# Patient Record
Sex: Male | Born: 1945 | Race: White | Hispanic: No | Smoking: Current some day smoker
Health system: Southern US, Community
[De-identification: ages and names within clinical notes are randomized; demographics above are authoritative.]

## PROBLEM LIST (undated history)

## (undated) DIAGNOSIS — Z8719 Personal history of other diseases of the digestive system: Secondary | ICD-10-CM

## (undated) DIAGNOSIS — E872 Acidosis: Secondary | ICD-10-CM

## (undated) DIAGNOSIS — I2699 Other pulmonary embolism without acute cor pulmonale: Secondary | ICD-10-CM

## (undated) DIAGNOSIS — I2510 Atherosclerotic heart disease of native coronary artery without angina pectoris: Secondary | ICD-10-CM

## (undated) DIAGNOSIS — I639 Cerebral infarction, unspecified: Secondary | ICD-10-CM

## (undated) DIAGNOSIS — R7989 Other specified abnormal findings of blood chemistry: Secondary | ICD-10-CM

## (undated) DIAGNOSIS — N179 Acute kidney failure, unspecified: Secondary | ICD-10-CM

## (undated) DIAGNOSIS — J9620 Acute and chronic respiratory failure, unspecified whether with hypoxia or hypercapnia: Secondary | ICD-10-CM

## (undated) DIAGNOSIS — E669 Obesity, unspecified: Secondary | ICD-10-CM

## (undated) DIAGNOSIS — L0390 Cellulitis, unspecified: Secondary | ICD-10-CM

## (undated) DIAGNOSIS — I82409 Acute embolism and thrombosis of unspecified deep veins of unspecified lower extremity: Secondary | ICD-10-CM

## (undated) DIAGNOSIS — C4490 Unspecified malignant neoplasm of skin, unspecified: Secondary | ICD-10-CM

## (undated) DIAGNOSIS — J45909 Unspecified asthma, uncomplicated: Secondary | ICD-10-CM

## (undated) DIAGNOSIS — N189 Chronic kidney disease, unspecified: Secondary | ICD-10-CM

## (undated) DIAGNOSIS — M1990 Unspecified osteoarthritis, unspecified site: Secondary | ICD-10-CM

## (undated) DIAGNOSIS — I519 Heart disease, unspecified: Secondary | ICD-10-CM

## (undated) DIAGNOSIS — E119 Type 2 diabetes mellitus without complications: Secondary | ICD-10-CM

## (undated) DIAGNOSIS — Z79899 Other long term (current) drug therapy: Secondary | ICD-10-CM

## (undated) DIAGNOSIS — Z8673 Personal history of transient ischemic attack (TIA), and cerebral infarction without residual deficits: Secondary | ICD-10-CM

## (undated) DIAGNOSIS — Z951 Presence of aortocoronary bypass graft: Secondary | ICD-10-CM

## (undated) DIAGNOSIS — I10 Essential (primary) hypertension: Secondary | ICD-10-CM

## (undated) DIAGNOSIS — Z8711 Personal history of peptic ulcer disease: Secondary | ICD-10-CM

## (undated) DIAGNOSIS — Z8659 Personal history of other mental and behavioral disorders: Secondary | ICD-10-CM

## (undated) DIAGNOSIS — Z22322 Carrier or suspected carrier of Methicillin resistant Staphylococcus aureus: Secondary | ICD-10-CM

## (undated) DIAGNOSIS — Z87898 Personal history of other specified conditions: Secondary | ICD-10-CM

## (undated) DIAGNOSIS — R079 Chest pain, unspecified: Secondary | ICD-10-CM

## (undated) DIAGNOSIS — J42 Unspecified chronic bronchitis: Secondary | ICD-10-CM

## (undated) DIAGNOSIS — I248 Other forms of acute ischemic heart disease: Secondary | ICD-10-CM

## (undated) DIAGNOSIS — F319 Bipolar disorder, unspecified: Secondary | ICD-10-CM

## (undated) DIAGNOSIS — G4733 Obstructive sleep apnea (adult) (pediatric): Secondary | ICD-10-CM

## (undated) DIAGNOSIS — R6510 Systemic inflammatory response syndrome (SIRS) of non-infectious origin without acute organ dysfunction: Secondary | ICD-10-CM

## (undated) DIAGNOSIS — I509 Heart failure, unspecified: Secondary | ICD-10-CM

## (undated) DIAGNOSIS — E878 Other disorders of electrolyte and fluid balance, not elsewhere classified: Secondary | ICD-10-CM

## (undated) DIAGNOSIS — L02415 Cutaneous abscess of right lower limb: Secondary | ICD-10-CM

## (undated) DIAGNOSIS — L0291 Cutaneous abscess, unspecified: Secondary | ICD-10-CM

## (undated) DIAGNOSIS — Z8709 Personal history of other diseases of the respiratory system: Secondary | ICD-10-CM

## (undated) HISTORY — DX: Chronic kidney disease, unspecified: N18.9

## (undated) HISTORY — DX: Personal history of other diseases of the respiratory system: Z87.09

## (undated) HISTORY — DX: Chest pain, unspecified: R07.9

## (undated) HISTORY — DX: Essential (primary) hypertension: I10

## (undated) HISTORY — PX: GALLBLADDER SURGERY: SHX652

## (undated) HISTORY — DX: Unspecified asthma, uncomplicated: J45.909

## (undated) HISTORY — DX: Personal history of peptic ulcer disease: Z87.11

## (undated) HISTORY — DX: Cutaneous abscess, unspecified: L02.91

## (undated) HISTORY — PX: HERNIA REPAIR: SHX51

## (undated) HISTORY — DX: Acute embolism and thrombosis of unspecified deep veins of unspecified lower extremity: I82.409

## (undated) HISTORY — DX: Bipolar disorder, unspecified: F31.9

## (undated) HISTORY — DX: Atherosclerotic heart disease of native coronary artery without angina pectoris: I25.10

## (undated) HISTORY — DX: Heart disease, unspecified: I51.9

## (undated) HISTORY — DX: Other long term (current) drug therapy: Z79.899

## (undated) HISTORY — DX: Cutaneous abscess of right lower limb: L02.415

## (undated) HISTORY — DX: Unspecified malignant neoplasm of skin, unspecified: C44.90

## (undated) HISTORY — DX: Type 2 diabetes mellitus without complications: E11.9

## (undated) HISTORY — DX: Personal history of transient ischemic attack (TIA), and cerebral infarction without residual deficits: Z86.73

## (undated) HISTORY — DX: Personal history of other diseases of the digestive system: Z87.19

## (undated) HISTORY — DX: Personal history of other mental and behavioral disorders: Z86.59

## (undated) HISTORY — PX: KIDNEY SURGERY: SHX687

## (undated) HISTORY — DX: Other forms of acute ischemic heart disease: I24.8

## (undated) HISTORY — DX: Cellulitis, unspecified: L03.90

## (undated) HISTORY — DX: Acute and chronic respiratory failure, unspecified whether with hypoxia or hypercapnia: J96.20

## (undated) HISTORY — DX: Acute kidney failure, unspecified: N17.9

## (undated) HISTORY — DX: Unspecified chronic bronchitis: J42

## (undated) HISTORY — PX: CHOLECYSTECTOMY: SHX55

## (undated) HISTORY — DX: Systemic inflammatory response syndrome (sirs) of non-infectious origin without acute organ dysfunction: R65.10

## (undated) HISTORY — DX: Acidosis: E87.2

## (undated) HISTORY — DX: Obesity, unspecified: E66.9

## (undated) HISTORY — DX: Cerebral infarction, unspecified: I63.9

## (undated) HISTORY — DX: Other specified abnormal findings of blood chemistry: R79.89

## (undated) HISTORY — DX: Unspecified osteoarthritis, unspecified site: M19.90

## (undated) HISTORY — PX: CARDIAC SURGERY: SHX584

## (undated) HISTORY — DX: Other disorders of electrolyte and fluid balance, not elsewhere classified: E87.8

## (undated) HISTORY — DX: Personal history of other specified conditions: Z87.898

## (undated) HISTORY — PX: OTHER SURGICAL HISTORY: SHX169

---

## 1997-12-30 ENCOUNTER — Emergency Department (HOSPITAL_COMMUNITY): Admission: EM | Admit: 1997-12-30 | Discharge: 1997-12-30 | Payer: Self-pay | Admitting: Internal Medicine

## 1998-02-17 ENCOUNTER — Inpatient Hospital Stay (HOSPITAL_COMMUNITY): Admission: EM | Admit: 1998-02-17 | Discharge: 1998-02-20 | Payer: Self-pay | Admitting: Emergency Medicine

## 2000-04-21 ENCOUNTER — Inpatient Hospital Stay (HOSPITAL_COMMUNITY): Admission: EM | Admit: 2000-04-21 | Discharge: 2000-04-27 | Payer: Self-pay | Admitting: *Deleted

## 2002-04-29 ENCOUNTER — Inpatient Hospital Stay (HOSPITAL_COMMUNITY): Admission: EM | Admit: 2002-04-29 | Discharge: 2002-05-04 | Payer: Self-pay | Admitting: Psychiatry

## 2004-06-10 ENCOUNTER — Emergency Department: Payer: Self-pay | Admitting: Emergency Medicine

## 2004-08-18 ENCOUNTER — Inpatient Hospital Stay: Payer: Self-pay | Admitting: Unknown Physician Specialty

## 2004-09-04 ENCOUNTER — Emergency Department: Payer: Self-pay | Admitting: General Practice

## 2004-09-26 ENCOUNTER — Ambulatory Visit: Payer: Self-pay

## 2005-11-01 ENCOUNTER — Inpatient Hospital Stay: Payer: Self-pay | Admitting: Psychiatry

## 2006-05-03 ENCOUNTER — Other Ambulatory Visit: Payer: Self-pay

## 2006-05-03 ENCOUNTER — Emergency Department: Payer: Self-pay | Admitting: General Practice

## 2008-01-18 ENCOUNTER — Emergency Department: Payer: Self-pay | Admitting: Emergency Medicine

## 2008-02-20 ENCOUNTER — Ambulatory Visit: Payer: Self-pay | Admitting: Internal Medicine

## 2008-03-08 ENCOUNTER — Inpatient Hospital Stay: Payer: Self-pay | Admitting: Internal Medicine

## 2009-03-28 ENCOUNTER — Emergency Department: Payer: Self-pay | Admitting: Emergency Medicine

## 2009-09-30 ENCOUNTER — Inpatient Hospital Stay: Payer: Self-pay | Admitting: Internal Medicine

## 2009-11-11 ENCOUNTER — Ambulatory Visit: Payer: Self-pay | Admitting: Gastroenterology

## 2010-01-30 ENCOUNTER — Ambulatory Visit: Payer: Self-pay | Admitting: Gastroenterology

## 2010-04-16 ENCOUNTER — Ambulatory Visit: Payer: Self-pay | Admitting: Gastroenterology

## 2010-06-16 ENCOUNTER — Emergency Department: Payer: Self-pay | Admitting: Emergency Medicine

## 2010-09-14 ENCOUNTER — Observation Stay: Payer: Self-pay | Admitting: Internal Medicine

## 2010-09-28 ENCOUNTER — Emergency Department: Payer: Self-pay | Admitting: Emergency Medicine

## 2010-10-04 ENCOUNTER — Emergency Department: Payer: Self-pay | Admitting: Emergency Medicine

## 2010-10-26 ENCOUNTER — Emergency Department: Payer: Self-pay | Admitting: Emergency Medicine

## 2011-07-04 ENCOUNTER — Emergency Department: Payer: Self-pay | Admitting: Emergency Medicine

## 2011-07-04 LAB — COMPREHENSIVE METABOLIC PANEL
Albumin: 3.5 g/dL (ref 3.4–5.0)
Alkaline Phosphatase: 90 U/L (ref 50–136)
Calcium, Total: 8.6 mg/dL (ref 8.5–10.1)
Chloride: 99 mmol/L (ref 98–107)
Co2: 24 mmol/L (ref 21–32)
EGFR (African American): >60
Glucose: 124 mg/dL — ABNORMAL HIGH (ref 65–99)
Potassium: 4.4 mmol/L (ref 3.5–5.1)
SGOT(AST): 24 U/L (ref 15–37)
SGPT (ALT): 26 U/L
Sodium: 133 mmol/L — ABNORMAL LOW (ref 136–145)
Total Protein: 7.5 g/dL (ref 6.4–8.2)

## 2011-07-04 LAB — CBC WITH DIFFERENTIAL/PLATELET
Basophil #: 0 10*3/uL (ref 0.0–0.1)
Basophil %: 0.2 %
Eosinophil #: 0.3 10*3/uL (ref 0.0–0.7)
Eosinophil %: 3.3 %
HCT: 32.6 % — ABNORMAL LOW (ref 40.0–52.0)
HGB: 10.2 g/dL — ABNORMAL LOW (ref 13.0–18.0)
Lymphocyte #: 1.1 10*3/uL (ref 1.0–3.6)
MCH: 24 pg — ABNORMAL LOW (ref 26.0–34.0)
MCV: 77 fL — ABNORMAL LOW (ref 80–100)
Monocyte #: 0.7 10*3/uL (ref 0.0–0.7)
Neutrophil %: 74.9 %
RBC: 4.26 10*6/uL — ABNORMAL LOW (ref 4.40–5.90)
RDW: 16.8 % — ABNORMAL HIGH (ref 11.5–14.5)
WBC: 8.1 10*3/uL (ref 3.8–10.6)

## 2011-07-08 LAB — URINALYSIS, COMPLETE
Bacteria: NONE SEEN
Bilirubin,UR: NEGATIVE
Bilirubin,UR: NEGATIVE
Blood: NEGATIVE
Blood: NEGATIVE
Glucose,UR: NEGATIVE mg/dL (ref 0–75)
Ketone: NEGATIVE
Leukocyte Esterase: NEGATIVE
Leukocyte Esterase: NEGATIVE
Nitrite: NEGATIVE
Nitrite: NEGATIVE
Ph: 6 (ref 4.5–8.0)
Ph: 8 (ref 4.5–8.0)
Protein: NEGATIVE
Protein: NEGATIVE
RBC,UR: 1 /HPF (ref 0–5)
RBC,UR: 1 /HPF (ref 0–5)
Specific Gravity: 1.016 (ref 1.003–1.030)
Squamous Epithelial: <1
Squamous Epithelial: NONE SEEN
WBC UR: <1 /HPF (ref 0–5)

## 2011-07-08 LAB — RETICULOCYTES
Absolute Retic Count: 0.13 10*6/uL — ABNORMAL HIGH (ref 0.024–0.084)
Reticulocyte: 3.4 % — ABNORMAL HIGH (ref 0.5–1.5)

## 2011-07-08 LAB — IRON AND TIBC
Iron Bind.Cap.(Total): 361 ug/dL (ref 250–450)
Iron Saturation: 11 %
Iron: 39 ug/dL — ABNORMAL LOW (ref 65–175)
Unbound Iron-Bind.Cap.: 322 ug/dL

## 2011-07-08 LAB — CK TOTAL AND CKMB (NOT AT ARMC)
CK, Total: 256 U/L — ABNORMAL HIGH (ref 35–232)
CK, Total: 302 U/L — ABNORMAL HIGH (ref 35–232)
CK-MB: 2.4 ng/mL (ref 0.5–3.6)
CK-MB: 2.8 ng/mL (ref 0.5–3.6)

## 2011-07-08 LAB — COMPREHENSIVE METABOLIC PANEL
Albumin: 3.2 g/dL — ABNORMAL LOW (ref 3.4–5.0)
Anion Gap: 10 (ref 7–16)
Calcium, Total: 8.1 mg/dL — ABNORMAL LOW (ref 8.5–10.1)
Co2: 22 mmol/L (ref 21–32)
EGFR (African American): >60
Glucose: 98 mg/dL (ref 65–99)
Potassium: 4.6 mmol/L (ref 3.5–5.1)
SGOT(AST): 38 U/L — ABNORMAL HIGH (ref 15–37)
SGPT (ALT): 29 U/L

## 2011-07-08 LAB — CBC
MCHC: 32 g/dL (ref 32.0–36.0)
MCV: 77 fL — ABNORMAL LOW (ref 80–100)
Platelet: 261 10*3/uL (ref 150–440)
RBC: 3.82 10*6/uL — ABNORMAL LOW (ref 4.40–5.90)
RDW: 17.9 % — ABNORMAL HIGH (ref 11.5–14.5)
WBC: 5.3 10*3/uL (ref 3.8–10.6)

## 2011-07-08 LAB — TROPONIN I: Troponin-I: 0.03 ng/mL

## 2011-07-09 LAB — BASIC METABOLIC PANEL
Anion Gap: 12 (ref 7–16)
BUN: 10 mg/dL (ref 7–18)
Creatinine: 1 mg/dL (ref 0.60–1.30)
EGFR (African American): >60
EGFR (Non-African Amer.): >60
Glucose: 106 mg/dL — ABNORMAL HIGH (ref 65–99)
Sodium: 128 mmol/L — ABNORMAL LOW (ref 136–145)

## 2011-07-09 LAB — CBC WITH DIFFERENTIAL/PLATELET
Basophil #: 0 10*3/uL (ref 0.0–0.1)
Eosinophil #: 0.4 10*3/uL (ref 0.0–0.7)
HGB: 10.1 g/dL — ABNORMAL LOW (ref 13.0–18.0)
Lymphocyte #: 1.2 10*3/uL (ref 1.0–3.6)
Lymphocyte %: 21.2 %
MCH: 24.6 pg — ABNORMAL LOW (ref 26.0–34.0)
MCHC: 32.5 g/dL (ref 32.0–36.0)
MCV: 76 fL — ABNORMAL LOW (ref 80–100)
Monocyte %: 12.4 %
RDW: 18.4 % — ABNORMAL HIGH (ref 11.5–14.5)

## 2011-07-09 LAB — SODIUM: Sodium: 126 mmol/L — ABNORMAL LOW (ref 136–145)

## 2011-07-09 LAB — LIPID PANEL
Cholesterol: 139 mg/dL (ref 0–200)
HDL Cholesterol: 46 mg/dL (ref 40–60)
VLDL Cholesterol, Calc: 25 mg/dL (ref 5–40)

## 2011-07-09 LAB — CK TOTAL AND CKMB (NOT AT ARMC)
CK, Total: 261 U/L — ABNORMAL HIGH (ref 35–232)
CK-MB: 2.6 ng/mL (ref 0.5–3.6)

## 2011-07-10 ENCOUNTER — Inpatient Hospital Stay: Payer: Self-pay | Admitting: Physician Assistant

## 2011-07-10 LAB — CBC WITH DIFFERENTIAL/PLATELET
Basophil #: 0 10*3/uL (ref 0.0–0.1)
Eosinophil #: 0.3 10*3/uL (ref 0.0–0.7)
HGB: 10.2 g/dL — ABNORMAL LOW (ref 13.0–18.0)
Lymphocyte %: 25 %
MCHC: 31.4 g/dL — ABNORMAL LOW (ref 32.0–36.0)
Monocyte %: 16.2 %
Neutrophil %: 51.9 %
RBC: 4.2 10*6/uL — ABNORMAL LOW (ref 4.40–5.90)
RDW: 18 % — ABNORMAL HIGH (ref 11.5–14.5)
WBC: 5.3 10*3/uL (ref 3.8–10.6)

## 2011-07-10 LAB — SODIUM
Sodium: 121 mmol/L — ABNORMAL LOW (ref 136–145)
Sodium: 123 mmol/L — ABNORMAL LOW (ref 136–145)

## 2011-07-10 LAB — BASIC METABOLIC PANEL
BUN: 12 mg/dL (ref 7–18)
Calcium, Total: 8.5 mg/dL (ref 8.5–10.1)
Chloride: 94 mmol/L — ABNORMAL LOW (ref 98–107)
Creatinine: 0.97 mg/dL (ref 0.60–1.30)
Potassium: 3.8 mmol/L (ref 3.5–5.1)
Sodium: 125 mmol/L — ABNORMAL LOW (ref 136–145)

## 2011-07-11 LAB — CBC WITH DIFFERENTIAL/PLATELET
Eosinophil #: 0.5 10*3/uL (ref 0.0–0.7)
Eosinophil %: 8.8 %
HGB: 10.6 g/dL — ABNORMAL LOW (ref 13.0–18.0)
Lymphocyte #: 1.2 10*3/uL (ref 1.0–3.6)
MCH: 24.8 pg — ABNORMAL LOW (ref 26.0–34.0)
MCHC: 32.1 g/dL (ref 32.0–36.0)
MCV: 77 fL — ABNORMAL LOW (ref 80–100)
Monocyte #: 0.8 10*3/uL — ABNORMAL HIGH (ref 0.0–0.7)
Monocyte %: 15.2 %
Neutrophil %: 52.4 %
Platelet: 278 10*3/uL (ref 150–440)
RBC: 4.28 10*6/uL — ABNORMAL LOW (ref 4.40–5.90)
RDW: 18.2 % — ABNORMAL HIGH (ref 11.5–14.5)

## 2011-07-11 LAB — BASIC METABOLIC PANEL
Anion Gap: 10 (ref 7–16)
Calcium, Total: 8.6 mg/dL (ref 8.5–10.1)
Chloride: 95 mmol/L — ABNORMAL LOW (ref 98–107)
Co2: 21 mmol/L (ref 21–32)
EGFR (Non-African Amer.): >60
Glucose: 106 mg/dL — ABNORMAL HIGH (ref 65–99)
Osmolality: 252 (ref 275–301)
Potassium: 4.4 mmol/L (ref 3.5–5.1)

## 2011-07-12 LAB — BASIC METABOLIC PANEL
Anion Gap: 8 (ref 7–16)
BUN: 11 mg/dL (ref 7–18)
Calcium, Total: 9 mg/dL (ref 8.5–10.1)
Chloride: 104 mmol/L (ref 98–107)
Co2: 23 mmol/L (ref 21–32)
Glucose: 102 mg/dL — ABNORMAL HIGH (ref 65–99)
Osmolality: 270 (ref 275–301)

## 2011-07-12 LAB — CBC WITH DIFFERENTIAL/PLATELET
HCT: 34 % — ABNORMAL LOW (ref 40.0–52.0)
MCH: 24.3 pg — ABNORMAL LOW (ref 26.0–34.0)
MCHC: 31.1 g/dL — ABNORMAL LOW (ref 32.0–36.0)
MCV: 78 fL — ABNORMAL LOW (ref 80–100)
Monocyte #: 0.6 10*3/uL (ref 0.0–0.7)
Neutrophil #: 3.3 10*3/uL (ref 1.4–6.5)
Platelet: 277 10*3/uL (ref 150–440)
RDW: 18.2 % — ABNORMAL HIGH (ref 11.5–14.5)

## 2011-07-13 LAB — CBC WITH DIFFERENTIAL/PLATELET
Basophil #: 0 10*3/uL (ref 0.0–0.1)
Eosinophil #: 0.4 10*3/uL (ref 0.0–0.7)
Eosinophil %: 5.3 %
HCT: 33.9 % — ABNORMAL LOW (ref 40.0–52.0)
HGB: 10.8 g/dL — ABNORMAL LOW (ref 13.0–18.0)
Lymphocyte #: 1.3 10*3/uL (ref 1.0–3.6)
Lymphocyte %: 20.2 %
MCV: 79 fL — ABNORMAL LOW (ref 80–100)
Monocyte #: 0.8 10*3/uL — ABNORMAL HIGH (ref 0.0–0.7)
Monocyte %: 12.6 %
Neutrophil #: 4.1 10*3/uL (ref 1.4–6.5)
RBC: 4.31 10*6/uL — ABNORMAL LOW (ref 4.40–5.90)
RDW: 18.7 % — ABNORMAL HIGH (ref 11.5–14.5)

## 2011-07-13 LAB — BASIC METABOLIC PANEL WITH GFR
Anion Gap: 11
BUN: 12 mg/dL
Calcium, Total: 8.9 mg/dL
Chloride: 105 mmol/L
Co2: 23 mmol/L
Creatinine: 0.92 mg/dL
EGFR (African American): >60
EGFR (Non-African Amer.): >60
Glucose: 103 mg/dL — ABNORMAL HIGH
Osmolality: 278
Potassium: 4.3 mmol/L
Sodium: 139 mmol/L

## 2011-07-24 ENCOUNTER — Emergency Department: Payer: Self-pay | Admitting: Emergency Medicine

## 2011-07-24 LAB — PRO B NATRIURETIC PEPTIDE: B-Type Natriuretic Peptide: 246 pg/mL — ABNORMAL HIGH (ref 0–125)

## 2011-07-24 LAB — COMPREHENSIVE METABOLIC PANEL
Albumin: 3.2 g/dL — ABNORMAL LOW (ref 3.4–5.0)
Alkaline Phosphatase: 75 U/L (ref 50–136)
Anion Gap: 11 (ref 7–16)
Calcium, Total: 8.1 mg/dL — ABNORMAL LOW (ref 8.5–10.1)
Chloride: 99 mmol/L (ref 98–107)
EGFR (African American): >60
Glucose: 123 mg/dL — ABNORMAL HIGH (ref 65–99)
Potassium: 4.3 mmol/L (ref 3.5–5.1)
SGOT(AST): 20 U/L (ref 15–37)
Total Protein: 7 g/dL (ref 6.4–8.2)

## 2011-07-24 LAB — URINALYSIS, COMPLETE
Bacteria: NONE SEEN
Bilirubin,UR: NEGATIVE
Blood: NEGATIVE
Glucose,UR: NEGATIVE mg/dL (ref 0–75)
Nitrite: NEGATIVE
Ph: 7 (ref 4.5–8.0)
RBC,UR: 1 /HPF (ref 0–5)
Specific Gravity: 1.01 (ref 1.003–1.030)
Squamous Epithelial: 1
WBC UR: <1 /HPF (ref 0–5)

## 2011-07-24 LAB — APTT: Activated PTT: 25.8 secs (ref 23.6–35.9)

## 2011-07-24 LAB — CBC
HCT: 31 % — ABNORMAL LOW (ref 40.0–52.0)
HGB: 10 g/dL — ABNORMAL LOW (ref 13.0–18.0)
MCH: 25.4 pg — ABNORMAL LOW (ref 26.0–34.0)
MCHC: 32.1 g/dL (ref 32.0–36.0)
MCV: 79 fL — ABNORMAL LOW (ref 80–100)
Platelet: 304 10*3/uL (ref 150–440)
RBC: 3.92 10*6/uL — ABNORMAL LOW (ref 4.40–5.90)
RDW: 20 % — ABNORMAL HIGH (ref 11.5–14.5)
WBC: 6.8 10*3/uL (ref 3.8–10.6)

## 2011-07-24 LAB — PROTIME-INR
INR: 1
Prothrombin Time: 13.6 secs (ref 11.5–14.7)

## 2012-01-25 ENCOUNTER — Emergency Department: Payer: Self-pay | Admitting: Emergency Medicine

## 2012-01-25 LAB — BASIC METABOLIC PANEL
Calcium, Total: 8.8 mg/dL (ref 8.5–10.1)
Chloride: 108 mmol/L — ABNORMAL HIGH (ref 98–107)
Co2: 26 mmol/L (ref 21–32)
EGFR (African American): >60
EGFR (Non-African Amer.): >60
Glucose: 108 mg/dL — ABNORMAL HIGH (ref 65–99)
Osmolality: 282 (ref 275–301)
Potassium: 4.3 mmol/L (ref 3.5–5.1)
Sodium: 140 mmol/L (ref 136–145)

## 2012-01-25 LAB — CK TOTAL AND CKMB (NOT AT ARMC)
CK, Total: 206 U/L (ref 35–232)
CK-MB: 0.5 ng/mL (ref 0.5–3.6)

## 2012-01-25 LAB — CBC
HGB: 11.7 g/dL — ABNORMAL LOW (ref 13.0–18.0)
MCH: 24.4 pg — ABNORMAL LOW (ref 26.0–34.0)
MCHC: 31.4 g/dL — ABNORMAL LOW (ref 32.0–36.0)
Platelet: 276 10*3/uL (ref 150–440)
RBC: 4.81 10*6/uL (ref 4.40–5.90)
WBC: 8.4 10*3/uL (ref 3.8–10.6)

## 2012-01-25 LAB — TROPONIN I: Troponin-I: <0.02 ng/mL

## 2012-01-26 LAB — CK TOTAL AND CKMB (NOT AT ARMC): CK-MB: 0.7 ng/mL (ref 0.5–3.6)

## 2012-01-26 LAB — TROPONIN I: Troponin-I: <0.02 ng/mL

## 2012-02-08 ENCOUNTER — Emergency Department: Payer: Self-pay | Admitting: Emergency Medicine

## 2012-02-08 LAB — COMPREHENSIVE METABOLIC PANEL
Anion Gap: 9 (ref 7–16)
BUN: 16 mg/dL (ref 7–18)
Bilirubin,Total: 0.2 mg/dL (ref 0.2–1.0)
Chloride: 108 mmol/L — ABNORMAL HIGH (ref 98–107)
Creatinine: 1.26 mg/dL (ref 0.60–1.30)
EGFR (African American): >60
Osmolality: 286 (ref 275–301)
Potassium: 4 mmol/L (ref 3.5–5.1)
SGPT (ALT): 32 U/L (ref 12–78)
Sodium: 142 mmol/L (ref 136–145)
Total Protein: 7.9 g/dL (ref 6.4–8.2)

## 2012-02-08 LAB — CBC
HCT: 37.5 % — ABNORMAL LOW (ref 40.0–52.0)
MCHC: 31.5 g/dL — ABNORMAL LOW (ref 32.0–36.0)
MCV: 78 fL — ABNORMAL LOW (ref 80–100)
Platelet: 295 10*3/uL (ref 150–440)
RBC: 4.81 10*6/uL (ref 4.40–5.90)
RDW: 18.6 % — ABNORMAL HIGH (ref 11.5–14.5)
WBC: 10.9 10*3/uL — ABNORMAL HIGH (ref 3.8–10.6)

## 2012-02-08 LAB — TROPONIN I
Troponin-I: <0.02 ng/mL
Troponin-I: <0.02 ng/mL

## 2012-02-08 LAB — URINALYSIS, COMPLETE
Bilirubin,UR: NEGATIVE
Blood: NEGATIVE
Glucose,UR: NEGATIVE mg/dL (ref 0–75)
Ketone: NEGATIVE
RBC,UR: 1 /HPF (ref 0–5)
Specific Gravity: 1.005 (ref 1.003–1.030)
Squamous Epithelial: <1
WBC UR: 14 /HPF (ref 0–5)

## 2012-02-08 LAB — CK TOTAL AND CKMB (NOT AT ARMC)
CK, Total: 215 U/L (ref 35–232)
CK-MB: 1.1 ng/mL (ref 0.5–3.6)

## 2012-02-08 LAB — PRO B NATRIURETIC PEPTIDE: B-Type Natriuretic Peptide: 61 pg/mL (ref 0–125)

## 2012-05-29 ENCOUNTER — Emergency Department: Payer: Self-pay | Admitting: Emergency Medicine

## 2012-08-01 ENCOUNTER — Emergency Department: Payer: Self-pay | Admitting: Emergency Medicine

## 2012-08-01 LAB — CBC
HGB: 9.9 g/dL — ABNORMAL LOW (ref 13.0–18.0)
Platelet: 326 10*3/uL (ref 150–440)
RBC: 4.19 10*6/uL — ABNORMAL LOW (ref 4.40–5.90)

## 2013-01-27 ENCOUNTER — Ambulatory Visit: Payer: Self-pay | Admitting: Internal Medicine

## 2013-02-24 ENCOUNTER — Encounter: Payer: Self-pay | Admitting: *Deleted

## 2013-02-28 ENCOUNTER — Ambulatory Visit: Payer: Self-pay | Admitting: Podiatry

## 2013-03-01 ENCOUNTER — Emergency Department: Payer: Self-pay | Admitting: Emergency Medicine

## 2013-03-14 ENCOUNTER — Ambulatory Visit (INDEPENDENT_AMBULATORY_CARE_PROVIDER_SITE_OTHER): Payer: PRIVATE HEALTH INSURANCE | Admitting: Podiatry

## 2013-03-14 ENCOUNTER — Encounter: Payer: Self-pay | Admitting: Podiatry

## 2013-03-14 VITALS — BP 116/52 | HR 86 | Resp 16 | Ht 69.0 in | Wt 334.0 lb

## 2013-03-14 DIAGNOSIS — B351 Tinea unguium: Secondary | ICD-10-CM

## 2013-03-14 DIAGNOSIS — M79609 Pain in unspecified limb: Secondary | ICD-10-CM

## 2013-03-14 NOTE — Progress Notes (Signed)
Subjective:     Patient ID: [name], male   DOB: [birth year], 67 y.o.   MRN: [id number]  HPI patient presents stating I have painful nail disease and I cannot cut them myself. Patient is obese which is a complicating factor for this condition   Review of Systems     Objective:   Physical Exam    neurovascular status found to be intact. Patient has thick painful nailbeds 1 through 5 bilateral that are incurvated and sore with pressure Assessment:     Mycotic infection nailbeds 1-5 bilateral with pain    Plan:     Debridement of nailbeds 1 through 5 bilateral no iatrogenic bleeding noted

## 2013-06-05 ENCOUNTER — Observation Stay: Payer: Self-pay | Admitting: Internal Medicine

## 2013-06-05 LAB — CBC
HCT: 32.3 % — AB (ref 40.0–52.0)
HGB: 9.6 g/dL — AB (ref 13.0–18.0)
MCH: 20.9 pg — ABNORMAL LOW (ref 26.0–34.0)
MCHC: 29.9 g/dL — ABNORMAL LOW (ref 32.0–36.0)
MCV: 70 fL — ABNORMAL LOW (ref 80–100)
PLATELETS: 312 10*3/uL (ref 150–440)
RBC: 4.6 10*6/uL (ref 4.40–5.90)
RDW: 19.5 % — ABNORMAL HIGH (ref 11.5–14.5)
WBC: 11.2 10*3/uL — AB (ref 3.8–10.6)

## 2013-06-05 LAB — COMPREHENSIVE METABOLIC PANEL
ALBUMIN: 3 g/dL — AB (ref 3.4–5.0)
ALT: 22 U/L (ref 12–78)
AST: 12 U/L — AB (ref 15–37)
Alkaline Phosphatase: 127 U/L — ABNORMAL HIGH
Anion Gap: 6 — ABNORMAL LOW (ref 7–16)
BUN: 16 mg/dL (ref 7–18)
Bilirubin,Total: 0.4 mg/dL (ref 0.2–1.0)
CALCIUM: 8.6 mg/dL (ref 8.5–10.1)
CO2: 27 mmol/L (ref 21–32)
CREATININE: 1.38 mg/dL — AB (ref 0.60–1.30)
Chloride: 104 mmol/L (ref 98–107)
EGFR (African American): >60
EGFR (Non-African Amer.): 52 — ABNORMAL LOW
Glucose: 104 mg/dL — ABNORMAL HIGH (ref 65–99)
Osmolality: 275 (ref 275–301)
Potassium: 3.7 mmol/L (ref 3.5–5.1)
Sodium: 137 mmol/L (ref 136–145)
Total Protein: 7.7 g/dL (ref 6.4–8.2)

## 2013-06-05 LAB — APTT: Activated PTT: 29.8 secs (ref 23.6–35.9)

## 2013-06-05 LAB — CK TOTAL AND CKMB (NOT AT ARMC)
CK, TOTAL: 108 U/L (ref 35–232)
CK-MB: 0.7 ng/mL (ref 0.5–3.6)

## 2013-06-05 LAB — PROTIME-INR
INR: 1.1
Prothrombin Time: 14 secs (ref 11.5–14.7)

## 2013-06-05 LAB — TROPONIN I: Troponin-I: <0.02 ng/mL

## 2013-06-13 ENCOUNTER — Ambulatory Visit (INDEPENDENT_AMBULATORY_CARE_PROVIDER_SITE_OTHER): Payer: Medicare HMO | Admitting: Podiatry

## 2013-06-13 DIAGNOSIS — M79609 Pain in unspecified limb: Secondary | ICD-10-CM

## 2013-06-13 DIAGNOSIS — B351 Tinea unguium: Secondary | ICD-10-CM

## 2013-06-13 NOTE — Progress Notes (Signed)
Subjective:     Patient ID: [name], male   DOB: December 26, 1945, 68 y.o.   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

## 2013-09-12 ENCOUNTER — Ambulatory Visit (INDEPENDENT_AMBULATORY_CARE_PROVIDER_SITE_OTHER): Payer: Medicare HMO | Admitting: Podiatry

## 2013-09-12 VITALS — Resp 16 | Ht 69.0 in | Wt 336.0 lb

## 2013-09-12 DIAGNOSIS — Q828 Other specified congenital malformations of skin: Secondary | ICD-10-CM

## 2013-09-12 DIAGNOSIS — M79609 Pain in unspecified limb: Secondary | ICD-10-CM

## 2013-09-12 DIAGNOSIS — B351 Tinea unguium: Secondary | ICD-10-CM

## 2013-09-12 DIAGNOSIS — E1159 Type 2 diabetes mellitus with other circulatory complications: Secondary | ICD-10-CM

## 2013-09-12 NOTE — Progress Notes (Signed)
Subjective:     Patient ID: [name], male   DOB: Apr 03, 1946, 68 y.o.   MRN: [id number]  HPI obese male who presents with nail thickness 1-5 both feet that he cannot cut himself and they become painful and keratotic lesion left first metatarsal. Long-term diabetic who has had issues for a long time and also has a small crack in the plantar left heel that is sore but he cannot take care of   Review of Systems     Objective:   Physical Exam Neurovascular status unchanged with patient well oriented x3 and found to have nail thickness 1-5 of both feet that are tender. Keratotic lesion of the left first metatarsal medial side and small crack left heel that is not open with no drainage odor or redness    Assessment:     Mycotic nail infection with pain and keratotic lesion sub-first metatarsal left with porokeratotic base in an at risk obese diabetic with small break of skin left heel that is not infected    Plan:     H&P performed and we will keep an eye on the plantar left heel. I gave him instructions if any drainage should occur redness swelling or pain to let us know immediately and today I debrided nailbeds 1-5 of both feet with no bleeding noted and lesion on the left feet with no bleeding noted

## 2013-11-22 DIAGNOSIS — E669 Obesity, unspecified: Secondary | ICD-10-CM

## 2013-11-22 DIAGNOSIS — J45909 Unspecified asthma, uncomplicated: Secondary | ICD-10-CM

## 2013-11-22 HISTORY — DX: Obesity, unspecified: E66.9

## 2013-11-22 HISTORY — DX: Unspecified asthma, uncomplicated: J45.909

## 2014-01-09 ENCOUNTER — Ambulatory Visit (INDEPENDENT_AMBULATORY_CARE_PROVIDER_SITE_OTHER): Payer: Medicare HMO | Admitting: Podiatry

## 2014-01-09 DIAGNOSIS — Q828 Other specified congenital malformations of skin: Secondary | ICD-10-CM

## 2014-01-09 DIAGNOSIS — E1159 Type 2 diabetes mellitus with other circulatory complications: Secondary | ICD-10-CM

## 2014-01-09 DIAGNOSIS — M79673 Pain in unspecified foot: Secondary | ICD-10-CM

## 2014-01-09 DIAGNOSIS — M79609 Pain in unspecified limb: Secondary | ICD-10-CM

## 2014-01-09 DIAGNOSIS — B351 Tinea unguium: Secondary | ICD-10-CM

## 2014-01-09 NOTE — Progress Notes (Signed)
Subjective:     Patient ID: [name], male   DOB: July 06, 1945, 68 y.o.   MRN: [id number]  HPI patient presents with thick incurvated nail bed 1-5 of both feet and lesions on the first metatarsal of both feet that are painful when pressed and he cannot take care of himself due to obesity long-term diabetes   Review of Systems     Objective:   Physical Exam Neurovascular status unchanged with thick yellow brittle nailbeds 1-5 both feet and keratotic lesions that are painful on the first metatarsal of both feet. Varicosities in the ankle edema in the forefoot and diminishment of circulatory status noted    Assessment:     At risk diabetic with lesion formation and nail disease that he cannot take care of and are painful    Plan:     Debridement of nailbeds 1-5 of both feet and debridement of lesions of both feet

## 2014-01-29 ENCOUNTER — Observation Stay: Payer: Self-pay | Admitting: Internal Medicine

## 2014-01-29 LAB — CBC
HCT: 32.1 % — ABNORMAL LOW (ref 40.0–52.0)
HGB: 9.4 g/dL — ABNORMAL LOW (ref 13.0–18.0)
MCH: 21 pg — ABNORMAL LOW (ref 26.0–34.0)
MCHC: 29.4 g/dL — ABNORMAL LOW (ref 32.0–36.0)
MCV: 71 fL — AB (ref 80–100)
Platelet: 332 10*3/uL (ref 150–440)
RBC: 4.5 10*6/uL (ref 4.40–5.90)
RDW: 19.9 % — ABNORMAL HIGH (ref 11.5–14.5)
WBC: 11.2 10*3/uL — ABNORMAL HIGH (ref 3.8–10.6)

## 2014-01-29 LAB — BASIC METABOLIC PANEL
Anion Gap: 7 (ref 7–16)
BUN: 21 mg/dL — ABNORMAL HIGH (ref 7–18)
CHLORIDE: 106 mmol/L (ref 98–107)
CO2: 24 mmol/L (ref 21–32)
CREATININE: 1.67 mg/dL — AB (ref 0.60–1.30)
Calcium, Total: 8.4 mg/dL — ABNORMAL LOW (ref 8.5–10.1)
EGFR (African American): 48 — ABNORMAL LOW
GFR CALC NON AF AMER: 41 — AB
Glucose: 144 mg/dL — ABNORMAL HIGH (ref 65–99)
Osmolality: 279 (ref 275–301)
Potassium: 4.5 mmol/L (ref 3.5–5.1)
Sodium: 137 mmol/L (ref 136–145)

## 2014-01-29 LAB — PROTIME-INR
INR: 1
PROTHROMBIN TIME: 13.4 s (ref 11.5–14.7)

## 2014-01-29 LAB — TROPONIN I: Troponin-I: <0.02 ng/mL

## 2014-01-29 LAB — PRO B NATRIURETIC PEPTIDE: B-Type Natriuretic Peptide: 105 pg/mL (ref 0–125)

## 2014-01-30 LAB — CBC WITH DIFFERENTIAL/PLATELET
BASOS PCT: 0.6 %
Basophil #: 0 10*3/uL (ref 0.0–0.1)
EOS ABS: 0.4 10*3/uL (ref 0.0–0.7)
EOS PCT: 5.2 %
HCT: 30.7 % — ABNORMAL LOW (ref 40.0–52.0)
HGB: 8.9 g/dL — ABNORMAL LOW (ref 13.0–18.0)
LYMPHS PCT: 21 %
Lymphocyte #: 1.7 10*3/uL (ref 1.0–3.6)
MCH: 20.7 pg — AB (ref 26.0–34.0)
MCHC: 29.1 g/dL — ABNORMAL LOW (ref 32.0–36.0)
MCV: 71 fL — AB (ref 80–100)
MONO ABS: 0.8 x10 3/mm (ref 0.2–1.0)
MONOS PCT: 10.3 %
NEUTROS ABS: 5.2 10*3/uL (ref 1.4–6.5)
Neutrophil %: 62.9 %
PLATELETS: 310 10*3/uL (ref 150–440)
RBC: 4.31 10*6/uL — ABNORMAL LOW (ref 4.40–5.90)
RDW: 19.4 % — ABNORMAL HIGH (ref 11.5–14.5)
WBC: 8.2 10*3/uL (ref 3.8–10.6)

## 2014-01-30 LAB — BASIC METABOLIC PANEL
Anion Gap: 9 (ref 7–16)
BUN: 19 mg/dL — AB (ref 7–18)
CO2: 24 mmol/L (ref 21–32)
Calcium, Total: 8.2 mg/dL — ABNORMAL LOW (ref 8.5–10.1)
Chloride: 109 mmol/L — ABNORMAL HIGH (ref 98–107)
Creatinine: 1.45 mg/dL — ABNORMAL HIGH (ref 0.60–1.30)
EGFR (African American): 57 — ABNORMAL LOW
EGFR (Non-African Amer.): 49 — ABNORMAL LOW
Glucose: 143 mg/dL — ABNORMAL HIGH (ref 65–99)
OSMOLALITY: 288 (ref 275–301)
POTASSIUM: 4.4 mmol/L (ref 3.5–5.1)
SODIUM: 142 mmol/L (ref 136–145)

## 2014-01-30 LAB — TROPONIN I

## 2014-04-10 ENCOUNTER — Ambulatory Visit (INDEPENDENT_AMBULATORY_CARE_PROVIDER_SITE_OTHER): Payer: Medicare HMO | Admitting: Podiatry

## 2014-04-10 DIAGNOSIS — B351 Tinea unguium: Secondary | ICD-10-CM

## 2014-04-10 DIAGNOSIS — M79676 Pain in unspecified toe(s): Secondary | ICD-10-CM

## 2014-04-11 NOTE — Progress Notes (Signed)
Patient ID: [name], male   DOB: 20-Jan-1946, 68 y.o.   MRN: [id number]  Subjective: 68 year old male returns the office today for diabetic risk assessment and for painful elongated toenails. He states that he is unable to trim the toes nails himself and they are painful particularly with shoe gear. Also states that his left fourth digit toenail turn black over one month ago after hitting it in a door. He denies any pain to the digit or any swelling, erythema. He states that his blood sugars typically runs approximate 120. He states that the left heel will occasionally open from a fissure however is not opened recently. No other complaints at this time. No acute changes since last. Denies any systemic complaints such as fevers, chills, nausea, vomiting.  Objective: AAO 3, NAD DP/PT pulse palpable bilaterally, CRT less than 3 seconds Protective sensation intact with Derrel Nip mom,, vibratory sensation intact, Achilles tendon reflex intact Nails hypertrophic, dystrophic, elongated, brittle, discolored 10. No surrounding erythema or drainage. The left fourth digit nail has black discoloration with subungual hematoma. No open lesions/ no fissuring noted in the left heel No calf pain, swelling, warmth, erythema.  Assessment: 68 year old male with onychomycosis  Plan: -Treatment options discussed including alternatives, risks, complications. -Nail sharply debrided 10 without complications. The left fourth digit nail has what appears to be subungual hematoma and some of the dried blood was removed at today's appointment. Discussed the patient that it should grow out with the nail. Discussed with him that if the nail loosens were causes any problems to call the office to make an appointment sooner. -Discussed the importance of daily foot inspection. -Follow-up in 3 months. In the meantime, call the office with any questions, concerns, change in symptoms.

## 2014-07-01 LAB — CBC
HCT: 36.7 % — ABNORMAL LOW (ref 40.0–52.0)
HGB: 10.9 g/dL — AB (ref 13.0–18.0)
MCH: 21.5 pg — ABNORMAL LOW (ref 26.0–34.0)
MCHC: 29.6 g/dL — AB (ref 32.0–36.0)
MCV: 73 fL — ABNORMAL LOW (ref 80–100)
Platelet: 310 10*3/uL (ref 150–440)
RBC: 5.06 10*6/uL (ref 4.40–5.90)
RDW: 19.4 % — AB (ref 11.5–14.5)
WBC: 6.8 10*3/uL (ref 3.8–10.6)

## 2014-07-01 LAB — BASIC METABOLIC PANEL
ANION GAP: 7 (ref 7–16)
BUN: 13 mg/dL (ref 7–18)
CHLORIDE: 111 mmol/L — AB (ref 98–107)
CREATININE: 1.48 mg/dL — AB (ref 0.60–1.30)
Calcium, Total: 8.9 mg/dL (ref 8.5–10.1)
Co2: 27 mmol/L (ref 21–32)
EGFR (African American): >60
EGFR (Non-African Amer.): 50 — ABNORMAL LOW
Glucose: 101 mg/dL — ABNORMAL HIGH (ref 65–99)
OSMOLALITY: 289 (ref 275–301)
POTASSIUM: 3.2 mmol/L — AB (ref 3.5–5.1)
Sodium: 145 mmol/L (ref 136–145)

## 2014-07-01 LAB — TROPONIN I: Troponin-I: 0.13 ng/mL — ABNORMAL HIGH

## 2014-07-01 LAB — CK TOTAL AND CKMB (NOT AT ARMC)
CK, Total: 98 U/L (ref 39–308)
CK-MB: 1.9 ng/mL (ref 0.5–3.6)

## 2014-07-02 ENCOUNTER — Observation Stay: Payer: Self-pay | Admitting: Internal Medicine

## 2014-07-02 LAB — URINALYSIS, COMPLETE
BILIRUBIN, UR: NEGATIVE
Blood: NEGATIVE
GLUCOSE, UR: NEGATIVE mg/dL (ref 0–75)
KETONE: NEGATIVE
Nitrite: POSITIVE
PH: 5 (ref 4.5–8.0)
Protein: 30
Specific Gravity: 1.024 (ref 1.003–1.030)
WBC UR: 57 /HPF (ref 0–5)

## 2014-07-02 LAB — TROPONIN I
Troponin-I: 0.08 ng/mL — ABNORMAL HIGH
Troponin-I: 0.06 ng/mL — ABNORMAL HIGH

## 2014-07-10 ENCOUNTER — Ambulatory Visit: Payer: Medicare HMO | Admitting: Podiatry

## 2014-07-12 ENCOUNTER — Ambulatory Visit: Payer: Medicare HMO | Admitting: Podiatry

## 2014-08-18 ENCOUNTER — Emergency Department: Payer: Self-pay | Admitting: Emergency Medicine

## 2014-08-19 LAB — TROPONIN I: Troponin-I: <0.03 ng/mL

## 2014-08-19 LAB — BASIC METABOLIC PANEL WITH GFR
Anion Gap: 5 — ABNORMAL LOW
BUN: 17 mg/dL
Calcium, Total: 8.4 mg/dL — ABNORMAL LOW
Chloride: 109 mmol/L
Co2: 26 mmol/L
Creatinine: 1.12 mg/dL
EGFR (African American): >60
EGFR (Non-African Amer.): >60
Glucose: 119 mg/dL — ABNORMAL HIGH
Potassium: 3.2 mmol/L — ABNORMAL LOW
Sodium: 140 mmol/L

## 2014-08-19 LAB — CBC
HCT: 36.5 % — ABNORMAL LOW
HGB: 11.3 g/dL — ABNORMAL LOW
MCH: 23.1 pg — ABNORMAL LOW
MCHC: 30.9 g/dL — ABNORMAL LOW
MCV: 75 fL — ABNORMAL LOW
Platelet: 283 x10 3/mm 3
RBC: 4.89 x10 6/mm 3
RDW: 20.7 % — ABNORMAL HIGH
WBC: 9 x10 3/mm 3

## 2014-08-20 ENCOUNTER — Inpatient Hospital Stay
Admit: 2014-08-20 | Disposition: A | Discharge status: Skilled Nursing Facility | Payer: Self-pay | Attending: Internal Medicine | Admitting: Internal Medicine

## 2014-08-20 LAB — URINALYSIS, COMPLETE
BILIRUBIN, UR: NEGATIVE
Blood: NEGATIVE
Glucose,UR: NEGATIVE mg/dL (ref 0–75)
Hyaline Cast: 14
KETONE: NEGATIVE
Nitrite: POSITIVE
Ph: 5 (ref 4.5–8.0)
Protein: 100
Specific Gravity: 1.016 (ref 1.003–1.030)
WBC UR: 112 /HPF (ref 0–5)

## 2014-08-20 LAB — COMPREHENSIVE METABOLIC PANEL
ALBUMIN: 3.2 g/dL — AB
Alkaline Phosphatase: 87 U/L
Anion Gap: 10 (ref 7–16)
BILIRUBIN TOTAL: 0.6 mg/dL
BUN: 20 mg/dL
CALCIUM: 8.6 mg/dL — AB
Chloride: 109 mmol/L
Co2: 25 mmol/L
Creatinine: 1.5 mg/dL — ABNORMAL HIGH
EGFR (Non-African Amer.): 47 — ABNORMAL LOW
GFR CALC AF AMER: 54 — AB
GLUCOSE: 94 mg/dL
POTASSIUM: 4.1 mmol/L
SGOT(AST): 27 U/L
SGPT (ALT): 15 U/L — ABNORMAL LOW
SODIUM: 144 mmol/L
Total Protein: 6.5 g/dL

## 2014-08-20 LAB — CBC WITH DIFFERENTIAL/PLATELET
BASOS PCT: 0.4 %
Basophil #: 0 10*3/uL (ref 0.0–0.1)
Eosinophil #: 0 10*3/uL (ref 0.0–0.7)
Eosinophil %: 0.2 %
HCT: 38.4 % — AB (ref 40.0–52.0)
HGB: 11.6 g/dL — AB (ref 13.0–18.0)
Lymphocyte #: 0.2 10*3/uL — ABNORMAL LOW (ref 1.0–3.6)
Lymphocyte %: 1.7 %
MCH: 22.6 pg — AB (ref 26.0–34.0)
MCHC: 30.1 g/dL — ABNORMAL LOW (ref 32.0–36.0)
MCV: 75 fL — ABNORMAL LOW (ref 80–100)
Monocyte #: 0 x10 3/mm — ABNORMAL LOW (ref 0.2–1.0)
Monocyte %: 0.3 %
NEUTROS ABS: 11.5 10*3/uL — AB (ref 1.4–6.5)
NEUTROS PCT: 97.4 %
Platelet: 241 10*3/uL (ref 150–440)
RBC: 5.11 10*6/uL (ref 4.40–5.90)
RDW: 20.9 % — ABNORMAL HIGH (ref 11.5–14.5)
WBC: 11.8 10*3/uL — ABNORMAL HIGH (ref 3.8–10.6)

## 2014-08-20 LAB — MAGNESIUM: Magnesium: 1.6 mg/dL — ABNORMAL LOW

## 2014-08-20 LAB — TROPONIN I: Troponin-I: <0.03 ng/mL

## 2014-08-20 LAB — LACTIC ACID, PLASMA
LACTIC ACID, VENOUS: 3.9 mmol/L — AB
Lactic Acid, Venous: 1.1 mmol/L

## 2014-08-20 LAB — PROTIME-INR
INR: 1.1
Prothrombin Time: 14.8 secs

## 2014-08-20 LAB — PHOSPHORUS: Phosphorus: 2.3 mg/dL — ABNORMAL LOW

## 2014-08-21 LAB — CBC WITH DIFFERENTIAL/PLATELET
Basophil #: 0.1 10*3/uL (ref 0.0–0.1)
Basophil %: 0.7 %
Eosinophil #: 0.3 10*3/uL (ref 0.0–0.7)
Eosinophil %: 2.9 %
HCT: 34.3 % — ABNORMAL LOW (ref 40.0–52.0)
HGB: 10.2 g/dL — ABNORMAL LOW (ref 13.0–18.0)
Lymphocyte #: 0.9 10*3/uL — ABNORMAL LOW (ref 1.0–3.6)
Lymphocyte %: 9.8 %
MCH: 22.4 pg — ABNORMAL LOW (ref 26.0–34.0)
MCHC: 29.7 g/dL — ABNORMAL LOW (ref 32.0–36.0)
MCV: 76 fL — ABNORMAL LOW (ref 80–100)
Monocyte #: 0.6 x10 3/mm (ref 0.2–1.0)
Monocyte %: 6.6 %
Neutrophil #: 7.4 10*3/uL — ABNORMAL HIGH (ref 1.4–6.5)
Neutrophil %: 80 %
Platelet: 195 10*3/uL (ref 150–440)
RBC: 4.55 10*6/uL (ref 4.40–5.90)
RDW: 20.4 % — ABNORMAL HIGH (ref 11.5–14.5)
WBC: 9.2 10*3/uL (ref 3.8–10.6)

## 2014-08-21 LAB — BASIC METABOLIC PANEL
Anion Gap: 5 — ABNORMAL LOW (ref 7–16)
BUN: 17 mg/dL
CALCIUM: 8.2 mg/dL — AB
CO2: 27 mmol/L
Chloride: 109 mmol/L
Creatinine: 1.1 mg/dL
Glucose: 115 mg/dL — ABNORMAL HIGH
POTASSIUM: 3.1 mmol/L — AB
SODIUM: 141 mmol/L

## 2014-08-22 LAB — BASIC METABOLIC PANEL
Anion Gap: 7 (ref 7–16)
BUN: 11 mg/dL
CHLORIDE: 106 mmol/L
CO2: 27 mmol/L
Calcium, Total: 8.5 mg/dL — ABNORMAL LOW
Creatinine: 0.82 mg/dL
EGFR (Non-African Amer.): >60
GLUCOSE: 130 mg/dL — AB
Potassium: 3.1 mmol/L — ABNORMAL LOW
Sodium: 140 mmol/L

## 2014-08-22 LAB — URINE CULTURE

## 2014-08-22 LAB — MAGNESIUM: Magnesium: 2 mg/dL

## 2014-08-23 LAB — POTASSIUM: Potassium: 3.6 mmol/L

## 2014-08-25 LAB — CULTURE, BLOOD (SINGLE)

## 2014-09-15 NOTE — H&P (Signed)
PATIENT NAME:  [name], [name] MR#:  [id number] DATE OF BIRTH:  01-23-1946  DATE OF ADMISSION:  01/29/2014  PRIMARY CARE PHYSICIAN: Alfredia Ferguson A. Elijio Miles, MD  CARDIOLOGY: Dionisio David, MD  CHIEF COMPLAINT: Chest pain.   HISTORY OF PRESENT ILLNESS: The patient is a 69 year old male with a known history of diabetes, morbid obesity, COPD, hypertension and coronary artery disease. He is being admitted for chest pain. The patient has been having cold and cough for the last 3 nights and went to [facility] this morning who told him that he probably has asthmatic bronchitis and he went back home. Around 4:30 this afternoon, when he was lying in the bed, he started having chest pain. He was not really doing anything at that time. He got up and walked around to see if the chest pain would go away, but did not. The pain was under his left nipple area, not radiating anywhere else, about 4/10 in severity. Considering his coronary artery disease, he decided to come to the Emergency Department via calling EMS. While in the ED, he was given aspirin and nitroglycerin. His pain came down to 2/10, and when I am seeing him it is 0/10. He does report having associated shortness of breath, which is improving.   PAST MEDICAL HISTORY:  1.  Hypertension.  2.  Hyperlipidemia.  3.  COPD. 4.  Bipolar disorder.  5.  Morbid obesity.  6.  Diet-controlled diabetes.  7.  Obstructive sleep apnea.  8.  Anxiety/depression.  9.  Diabetic neuropathy.  10.  Coronary artery disease.   SOCIAL HISTORY: No smoking, alcohol or illicit drug use. He lives at home by himself.  ALLERGIES: DILAUDID, MORPHINE, PENICILLIN AND TETRACYCLINE.   FAMILY HISTORY: Mother and father both are deceased. Mother died from complication of diabetes and cerebral hemorrhage. Father died from MI, he also was diabetic.   MEDICATIONS AT HOME:  1.  Abilify 10 mg p.o. daily.  2.  Amlodipine 10 mg p.o. daily.  3.  Aspirin 325 mg p.o. daily.  4.   Atorvastatin 40 mg p.o. daily.  5.  Bupropion 100 mg p.o. daily. 6.  Celebrex 200 mg p.o. b.i.d.  7.  Cetirizine 10 mg p.o. daily.  8.  Cyclobenzaprine 10 mg p.o. 3 times a day.  9.  Colace 100 mg p.o. 3 times a day.  10.  Ziprasidone 60 mg p.o. b.i.d.   11.  Lasix 20 mg p.o. b.i.d.  12.  Gabapentin 400 mg p.o. 3 times a day.  13.  Hydralazine 25 mg p.o. 4 times a day.  14.  Isosorbide mononitrate 60 mg p.o. daily.  15.  Levothyroxine 100 mcg p.o. daily.  16.  Lorazepam 2 mg p.o. at bedtime.  17.  Lunesta 3 mg p.o. at bedtime.  18.  Magnesium oxide 400 mg p.o. daily. 19.  Meclizine 25 mg p.o. daily as needed.  20.  Melatonin 5 mg p.o. at bedtime.  21.  Metoprolol 50 mg p.o. b.i.d.  22.  Nexium 40 mg p.o. b.i.d.  23.  Plavix 75 mg p.o. daily.  24.  Potassium chloride 20 mEq p.o. daily.  25.  Ramipril 10 mg p.o. daily.  26.  Ranexa 500 mg p.o. b.i.d.  27.  Singular 10 mg p.o. at bedtime.   REVIEW OF SYSTEMS: CONSTITUTIONAL: No fever, fatigue, weakness.  EYES: No blurry or double vision.  EARS, NOSE AND THROAT: No tinnitus or ear pain.  RESPIRATORY: Positive for cough and cold along with some  asthmatic symptoms.  CARDIOVASCULAR: Some chest pain. No orthopnea or edema.  GASTROINTESTINAL: No nausea, vomiting or diarrhea. Positive for constipation. GENITOURINARY: No dysuria or hematuria.  ENDOCRINE: No polyuria or nocturia.  HEMATOLOGY: No anemia or easy bruising.  SKIN: No rash or lesion.  MUSCULOSKELETAL: No arthritis or muscle cramp.  NEUROLOGIC: No tingling, numbness, weakness.  PSYCHIATRIC: Positive for anxiety and depression. Also positive for bipolar disorder.   PHYSICAL EXAMINATION:  VITAL SIGNS: Temperature 97.8, heart rate 83 per minute, respirations 22 per minute, blood pressure 169/69 mmHg. He is saturating 98% on room air.  GENERAL: The patient is a 69 year old morbidly obese male lying in the bed comfortably without any acute distress.  EYES: Pupils equal, round,  reactive to light and accommodation. No scleral icterus. Extraocular muscles intact.  HENT: Head atraumatic, normocephalic. Oropharynx and nasopharynx clear.  NECK: Supple. No jugular venous distention. No thyroid enlargement or tenderness.  LUNGS: Clear to auscultation bilaterally. No wheezing, rales, rhonchi or crepitation.  CARDIOVASCULAR: S1, S2 normal. No murmurs, rubs or gallop.  ABDOMEN: Soft, nontender, nondistended. Bowel sounds present. No organomegaly or masses appreciated. EXTREMITIES: No pedal edema, cyanosis, clubbing.  NEUROLOGIC: Nonfocal examination. Cranial nerves II-XII intact. Muscle strength 5/5 in all extremities. Sensation intact.  PSYCHIATRIC: The patient is alert and oriented x 3.  SKIN: No obvious rash, lesion, ulcer.  MUSCULOSKELETAL: No joint effusion or tenderness.   LABORATORY PANEL: Normal BMP, except BUN of 21, creatinine 1.67. Normal first set of troponins. CBC showed white count of 11.2, hematocrit 32.1, platelet 332,000 hemoglobin 9.4. Normal coagulation panel.  IMAGING: Chest x-ray in the ED showed no acute cardiopulmonary disease. EKG showed no acute ST-T changes.   IMPRESSION AND PLAN:  1.  Chest pain, likely noncardiac although he does have significant coronary disease. We will consult cardiology. Obtain 3 sets of troponins. Monitor him on telemetry and continue home medications. This sounds more from asthmatic bronchitis than truly cardiac but cannot be sure at this time.  2.  Asthmatic bronchitis. We will continue his inhalers and monitor his symptoms. He is not hypoxic at this time.  3.  Hypertension. We will continue home medication.  4.  Acute renal failure. We will hydrate him with IV fluids and monitor his renal function; this is likely prerenal.   CODE STATUS: Full code.   TOTAL TIME TAKING CARE OF THIS PATIENT: Fifty-five minutes.   ____________________________ Lucina Mellow. Manuella Ghazi, MD vss:TT D: 01/29/2014 20:16:00 ET T: 01/29/2014 20:32:46  ET JOB#: [id number]  cc: Justyce Yeater S. Manuella Ghazi, MD, <Dictator> Dionisio David, MD Venetia Maxon. Elijio Miles, MD Remer Macho MD ELECTRONICALLY SIGNED 02/05/2014 11:44

## 2014-09-15 NOTE — H&P (Signed)
PATIENT NAME:  [name], [name] MR#:  [id number] DATE OF BIRTH:  [birth year]  DATE OF ADMISSION:  06/05/2013  PRIMARY CARE PHYSICIAN: [name] Dupre, MD  CARDIOLOGIST: Dr. Neoma Laming.   CHIEF COMPLAINT: Chest pain.   HISTORY OF PRESENT ILLNESS: This is a 69 year old male who presents to the hospital complaining of chest pain that began around 5:00 this morning. The patient says that he was using a bedside commode when he developed the chest pain. The pain was sharp in nature, located in the left side of his chest, radiating to his neck and sometimes down his left arm. The pain lasted for about 10 minutes or so. The patient was brought by EMS to the hospital. He received some sublingual nitroglycerin and aspirin and since then his pain has improved. The patient denies ever having this chest pain in the past. He denies any nausea or vomiting with the pain. He does admit to some mild shortness of breath, but no diaphoresis, no palpitations, no syncope, and no other associated symptoms presently. The patient does have significant risk factors for coronary artery disease given his morbid obesity, diet-controlled diabetes, and family history. Hospitalist services were contacted for further treatment and evaluation.   REVIEW OF SYSTEMS:  CONSTITUTIONAL: No documented fever. No weight gain or weight loss.  EYES: No blurred or double vision.  ENT: No tinnitus. No postnasal drip. No redness of the oropharynx.  RESPIRATORY: No cough, no wheeze, no hemoptysis, no dyspnea.  CARDIOVASCULAR: Positive chest pain. No orthopnea, no palpitations, no syncope.  GASTROINTESTINAL: No nausea, no vomiting, no diarrhea, no abdominal pain, no melena or hematochezia.  GENITOURINARY: No dysuria or hematuria.  ENDOCRINE: No polyuria or nocturia. No heat or cold intolerance. HEMATOLOGIC: No anemia. No bruising. No bleeding.  INTEGUMENTARY: No rashes. No lesions.  MUSCULOSKELETAL: No arthritis. No swelling. No gout.   NEUROLOGIC: No numbness, no tingling, no ataxia, no seizure type activity.  PSYCHIATRIC: Positive anxiety. Positive depression. No ADD. Positive bipolar disorder.   PAST MEDICAL HISTORY: Consistent with diet controlled diabetes, morbid obesity, obstructive sleep apnea, hypertension, hyperlipidemia, COPD, bipolar disorder, anxiety/depression, and diabetic neuropathy.   ALLERGIES: DILAUDID, MORPHINE, PENICILLIN AND TETRACYCLINE.   SOCIAL HISTORY: No smoking. No alcohol abuse. No illicit drug abuse. Lives at home by himself.   FAMILY HISTORY: Both mother and father are deceased. Mother died from complications of diabetes and cerebral hemorrhage. Father died from a myocardial infarction. He also was diabetic.   CURRENT MEDICATIONS: Abilify 10 mg daily, Advair 100/50 one puff b.i.d., amlodipine 10 mg daily, aspirin 325 mg daily, atorvastatin 40 mg daily, bupropion 100 mg daily, Celebrex 200 mg b.i.d., cetirizine 10 mg daily, Colace 100 mg daily, Ferrex 150 mg capsule daily, Lasix 20 mg b.i.d., gabapentin 400 mg t.i.d., Imdur 60 mg daily, Synthroid 100 mcg daily, lorazepam 2 mg 1 tab at bedtime, Lunesta 3 mg at bedtime, magnesium oxide 400 mg daily, meclizine 25 mg as needed, melatonin 5 mg at bedtime, metoprolol tartrate 50 mg b.i.d., Nexium 40 mg b.i.d., Plavix 75 mg daily, potassium 10 mEq daily, ramipril 10 mg daily, Ranexa 500 mg b.i.d., Singulair 10 mg daily, ziprasidone 60 mg b.i.d., and zolpidem 5 mg at bedtime.   PHYSICAL EXAMINATION: Presently is as follows:  VITAL SIGNS: Temperature is 98, pulse 74, respirations 20, blood pressure 145/65, and sats 98% on room air.  GENERAL: He is a pleasant-appearing male in no apparent distress.  HEENT: Atraumatic, normocephalic. Extraocular muscles are intact. Pupils are equal and reactive to light.  Sclerae anicteric. No conjunctival injection. No pharyngeal erythema.  NECK: Supple. No jugular venous distention. No bruits. No lymphadenopathy or thyromegaly.   HEART: Regular rate and rhythm. No murmurs, rubs, or clicks.  LUNGS: Clear to auscultation bilaterally. No rales or rhonchi. No wheezes.  ABDOMEN: Soft, flat, nontender, and nondistended. Has good bowel sounds. No hepatosplenomegaly appreciated.  EXTREMITIES: No evidence of any cyanosis, clubbing, or peripheral edema. Has +2 pedal and radial pulses bilaterally.  NEUROLOGIC: The patient is alert, awake, and oriented x3 with no focal motor or sensory deficits appreciated bilaterally.   SKIN: Moist and warm with no rashes appreciated.  LYMPHATIC: There is no cervical or axillary lymphadenopathy.   LABORATORY AND DIAGNOSTICS: Shows a serum glucose of 104, BUN 16, creatinine 1.3, sodium 137, potassium 3.7, chloride 104, and bicarb 27. LFTs are within normal limits. Troponin less than 0.02. White cell count 11.2, hemoglobin 9.6, hematocrit 32.3, and platelet count 312. INR is 1.1. D-dimer is mildly elevated at 0.6.   The patient did have a chest x-ray done which showed no acute disease.   EKG shows normal sinus rhythm with no acute ST or T wave changes.   ASSESSMENT AND PLAN: This is a 69 year old male with a history of diet-controlled diabetes, obstructive sleep apnea, morbid obesity, hypertension, hyperlipidemia, chronic obstructive pulmonary disease, bipolar disorder, history of diabetic neuropathy, and anxiety/depression who presents to the hospital with chest pain.  1.  Chest pain. The patient does have significant risk factors for coronary artery disease given his morbid obesity, hypertension, chronic obstructive pulmonary disease, diabetes, and also strong family history. The patient did have a cardiac catheterization done in April of 2012 which showed an 80% stenosis in the proximal LAD, in the mid LAD 20% lesion, and in the diagonal 70% stenosis. He did have a stress test maybe a few months back, which was apparently normal, as per him. Currently he is chest pain-free and hemodynamically stable.  I will observe him overnight on telemetry, follow serial cardiac markers, continue aspirin, statin, beta blocker, and nitro. We will get a cardiology consult, discuss the case with Dr. Humphrey Rolls who will see the patient. The patient's d-dimer is mildly elevated and ill get a CT chest to rule out pulmonary embolism.  2.  Hypertension. The patient is presently hemodynamically stable. I will continue his metoprolol, ramipril, Imdur, and Norvasc.  3.  Hyperlipidemia. Continue atorvastatin.  4.  Chronic obstructive pulmonary disease. No acute exacerbation. Continue Advair. 5.  History of bipolar disorder.  Continue Geodon. 6.  Anxiety/depression. Continue Wellbutrin and Abilify.  7.  Diabetic neuropathy. Continue Neurontin.   CODE STATUS: The patient is a FULL code.   TIME SPENT WITH ADMISSION: 50 minutes. ____________________________ Belia Heman. Verdell Carmine, MD vjs:sb D: 06/05/2013 09:23:31 ET T: 06/05/2013 11:20:14 ET JOB#: [id number]  cc: Belia Heman. Verdell Carmine, MD, <Dictator> Henreitta Leber MD ELECTRONICALLY SIGNED 06/21/2013 11:27

## 2014-09-15 NOTE — Consult Note (Signed)
PATIENT NAME:  [name], [name] MR#:  [id number] DATE OF BIRTH:  1945/09/23  DATE OF CONSULTATION:  06/05/2013  REFERRING PHYSICIAN:   CONSULTING PHYSICIAN:  Dionisio David, MD  INDICATION FOR CONSULTATION: Chest pain.   HISTORY OF PRESENT ILLNESS: This is a 69 year old white male with a past medical history of coronary artery disease, status post CABG, who came into the hospital with chest pain. The chest pain was described as sharp on the left precordium lasting for 15 minutes. It woke him up at 5:00 a.m. from chest pain. He denies any chest pain right now. He is feeling very comfortable.   PAST MEDICAL HISTORY: History of CABG. He had a cardiac catheterization done on 10/01/2009, which showed LAD 80%, diagonal 1 80%, the LIMA to the LAD was patent and saphenous vein graft to the diagonal was patent. Right coronary artery had no significant disease. LV ejection fraction was normal. His other past medical history is history of TIAs, hypertension, back pain, CHF, dyslipidemia and bipolar disorder.   CURRENT MEDICATIONS: Ramipril, Imdur 60 mg once a day, Combivent inhalers, Lunesta, Ativan, Lasix 20 mg once a day, Lipitor 40 mg once a day, potassium chloride 20 mEq once a day, Synthroid, Nexium 40 mg once a day, 10 mg of Norvasc once a day, Plavix 75 mg once a day, Ranexa 500 b.i.d. and\, aspirin he is also taking 325 mg once a day.   PHYSICAL EXAMINATION:  GENERAL: He is alert, oriented x 3, in no acute distress.  VITAL SIGNS: Stable.  NECK: No JVD.  LUNGS: Clear.  HEART: Regular rate and rhythm. Normal S1, S2. No audible murmur.  ABDOMEN: Soft, nontender, positive bowel sounds.  EXTREMITIES: No pedal edema.  NEUROLOGIC: The patient appears to be intact.   EKG shows normal sinus rhythm, nonspecific ST-T changes and first-degree AV block.   ASSESSMENT AND PLAN: The patient has atypical chest pain, mildly elevated d-dimer, almost normal. The chest pain is gone now. Advised ruling out  myocardial infarction and we will do outpatient stress test.   Thank you very much for the referral.  ____________________________ Dionisio David, MD sak:aw D: 06/05/2013 09:56:39 ET T: 06/05/2013 10:08:55 ET JOB#: [id number]  cc: Dionisio David, MD, <Dictator> Dionisio David MD ELECTRONICALLY SIGNED 06/07/2013 10:11

## 2014-09-15 NOTE — Consult Note (Signed)
PATIENT NAME:  [name], [name] MR#:  [id number] DATE OF BIRTH:  03-17-1946  DATE OF CONSULTATION:  01/30/2014  REFERRING PHYSICIAN:   CONSULTING PHYSICIAN:  Kelby Fam. Dorena Cookey, PA-C  Jasmine Pang, physician assistant, dictating cardiology consult for Dr. Neoma Laming.   INDICATION FOR CONSULTATION: Chest pain.  HISTORY OF PRESENT ILLNESS: This is a 69 year old, white male, well known to our practice, who presented to the ER yesterday with chest pain. The patient states that he has been having cold and cough symptoms for a few days, left-sided chest pain starting yesterday while lying down. He describes the pain as pressure-type, unrelenting with no radiation to the left arm or jaw. He admits to concurrent shortness of breath, but denies nausea or diaphoresis.   PAST MEDICAL HISTORY: Coronary artery disease status post CABG x 2 in 2003, hypertension, hyperlipidemia, diabetes mellitus.   SOCIAL HISTORY: The patient denies alcohol, tobacco or other illicit drug use.   ALLERGIES: DILAUDID, MORPHINE, PENICILLIN AND TETRACYCLINE.   FAMILY HISTORY: Positive for CAD with father.   HOME MEDICATIONS PERTINENT TO CARDIOLOGY INCLUDE: Aspirin 325 daily, furosemide 20 mg b.i.d., hydralazine 25 mg t.i.d., Imdur 60 mg daily, Lipitor 40 mg at bedtime, metoprolol 50 mg b.i.d., Norvasc 10 mg daily, Plavix 75 mg daily, potassium 20 mEq daily, ramipril 10 mg daily, Ranexa 50 mg b.i.d.   REVIEW OF SYSTEMS: CONSTITUTIONAL: Denies fatigue, malaise or weakness.  RESPIRATORY: Positive for cough, shortness of breath.  CARDIOVASCULAR: Chest pain resolved. No orthopnea, paroxysmal nocturnal dyspnea or pedal edema.  GASTROINTESTINAL: No abdominal pain, nausea or vomiting.  PHYSICAL EXAMINATION:  VITAL SIGNS: Temperature 97.7, pulse 66, respirations 18, blood pressure 107/68, pulse oximetry 95% saturation on 2 liters nasal cannula. GENERAL: Alert and oriented x 3, in no acute distress.  NECK: No JVD or carotid  bruits.  CARDIOVASCULAR: Normal S1, S2. No audible murmurs.  LUNGS: Wheezes bilaterally. No rhonchi or crackles.  ABDOMEN: Soft, tender positive bowel sounds.  EXTREMITIES: Trace pedal edema bilaterally.   LABORATORY DATA: BUN 19, creatinine 1.45, troponin negative x 3.   EKG: Normal sinus rhythm, 82 beats per minute, first-degree AV block no acute ST or T changes.   ASSESSMENT AND PLAN: Atypical chest pain, likely musculoskeletal related to coughing and asthma exacerbation. Troponins and EKG are unremarkable. The patient already has follow-up in the office, Thursday, September 10, at 2 p.m., will complete outpatient work-up with nuclear stress test and echocardiogram at this time. Okay from cardiology standpoint for discharge today.   Thank you very much for this consult.    ____________________________ Kelby Fam. Dorena Cookey, PA-C ear:JT D: 01/30/2014 08:54:46 ET T: 01/30/2014 09:27:46 ET JOB#: [id number]  cc: Dyann Ruddle A. [place], <Dictator> Kelby Fam Jaysion Ramseyer PA ELECTRONICALLY SIGNED 02/02/2014 8:32

## 2014-09-16 NOTE — Discharge Summary (Signed)
PATIENT NAME:  [name], [name] MR#:  [id number] DATE OF BIRTH:  11-01-1945  DATE OF ADMISSION:  07/10/2011 DATE OF DISCHARGE:  07/13/2011  DISCHARGE DIAGNOSES:  1. Vertebrobasilar insufficiency. 2. Hyponatremia. 3. History of coronary artery disease.  4. History of cerebrovascular disease.   SECONDARY DIAGNOSES: 1.   Chronic obstructive pulmonary disease 2.   Bipolar disorder 3.   Secondary insomnia 4.   Polypharmacy  DISCHARGE MEDICATIONS: The patient is to resume all home medications with the exception of the following modifications:  1. The patient is to take only Lunesta as needed for sleep.  2. The patient is not to take either temazepam or Sonata at bedtime.  3. The patient is to hold carbamazepine/Tegretol due to believing it was involved in his hyponatremia.  4. The patient is to begin taking Spiriva, inhale the contents of 1 capsule by mouth once daily. For  best effect, do 2 puffs for every 1 capsule.  5. The patient is to continue taking other psychotropics as directed by psychiatry.  HOSPITAL COURSE: This patient is a 69 year old Caucasian male with extensive past medical history as noted in his history and physical examination from date of admission who presented to the emergency department on the date of admission with the chief complaint of "I got weak". He also admitted to some paresthesias in his upper extremities bilaterally. He activated his Lifeline and was brought to the emergency room on the date of admission for further evaluation. Please see history and physical examination from the date of admission for full details regarding his initial evaluation, triage, and treatment. The patient was initially admitted to the observation unit for further evaluation and monitoring. The patient was noted to have polypharmacy, both with respect to his psychotropic regimen as well as being on three different agents for sleep. He was continued on his Johnnye Sima while he was in the hospital  as he felt that this provided him the greatest benefit with respect to his sleep. The other two soporific agents were held during his stay. Psychiatry was also consulted for medication consolidation and recommendations. Psychiatry did follow the patient throughout his stay and he was stable from a psychiatric standpoint throughout all points of his stay.   The patient did have significant lower extremity pitting edema, which required a short course of IV furosemide to resolve. He did undergo repeat echocardiogram while he was at the hospital, which revealed findings consistent with right ventricular failure and diastolic dysfunction, as well as pulmonary hypertension. He did have preserved left ventricular systolic function. Overall, the patient's cardiac history and current comorbidities remained uncomplicated and were not difficult to manage during his stay.   The patient was also found to have prominent hyponatremia, which was made briefly worse since he required IV diuretics. However, once his edema had resolved, IV diuretics were stopped and the patient was started on gentle IV hydration consisting of normal saline. Of note, the patient's carbamazepine was also held, as this is notorious for causing hyponatremia. The patient's sodium did trend upward nicely in a safe fashion. On date of discharge, the patient's hypernatremia had resolved.   As aforementioned, psychiatry was consulted regarding the patient's psychiatric care including concerns of polypharmacy. Psychiatry agreed with holding all but one of the patient's sleeping medications. Psychiatry did also agree with holding carbamazepine and increased his Geodon to 80 mg twice a day for improved psychiatric control while the patient was coming off the carbamazepine. No other medications were changed. The patient will  have close follow-up with his outpatient psychiatrist for further adjustment of his psychotropic medications.   Regarding the  patient's symptoms of generalized weakness and bilateral upper extremity paresthesias, this is believed to be most likely multifactorial. The patient's hyponatremia could have contributed, as could have the patient's polypharmacy. Furthermore, the patient did undergo CT of the head without contrast and subsequently MRI of the brain without contrast both of which were negative for acute findings, but both of which also showed hydrocephalus ex vacuo. The patient did note a history of pseudotumor cerebra/idiopathic intracranial hypertension. However, the finding of hydrocephalus ex vacuo is not attributable to this diagnosis. Neurology was consulted regarding the patient's symptoms and his neuroimaging findings. Neurology ultimately diagnosed the patient with vertebrobasilar insufficiency. The patient will maintain follow-up with neurology, if he continues to have symptoms.   The patient did slowly start to feel better with each subsequent day during his hospitalization. His bilateral upper extremity paresthesias had actually resolved after the first 36 hours while he was in the hospital. On the day of discharge, the patient did state he felt "all better", and was eager to return home. The patient was discharged from the hospital in satisfactory condition.   DIET: Low sodium, ADA diet.   ACTIVITY: As tolerated.   DISCHARGE FOLLOWUP: Follow-up with Dr. Elijio Miles of [place] within 1 to 2 weeks and with Dr. Kasandra Knudsen, the patient's psychiatrist, within 1 to 2 weeks. The decision of whether the patient will be seen as an outpatient by neurology will be discussed between the patient and Dr. [name] Dupre at his follow-up appointment.   TIME SPENT: Discharge time spent including coordination of care, patient education and the like was greater than 45 minutes.  ____________________________ Kizzie Furnish Audie Clear, MSPAS, dictating on behalf of Dr. Elijio Miles. mgm:slb D: 07/13/2011 17:16:40  ET T: 07/14/2011 10:54:17 ET JOB#: [id number]  cc: Kizzie Furnish. Prudence Davidson, PA, <Dictator> Elwyn Reach PA ELECTRONICALLY SIGNED 07/15/2011 16:29 Veverly Fells MD ELECTRONICALLY SIGNED 07/20/2011 12:59

## 2014-09-16 NOTE — Consult Note (Signed)
PATIENT NAME:  [name], [name] MR#:  [id number] DATE OF BIRTH:  [birth year]  DATE OF CONSULTATION:  07/10/2011  REFERRING PHYSICIAN:  Dr. Elijio Miles CONSULTING PHYSICIAN:  Rudell Cobb. Loletta Specter, MD  HISTORY: [name] is a 69 year old left-handed white patient of Dr. Elijio Miles with history of hypertension, hyperlipidemia, diet-controlled diabetes mellitus, morbid obesity, obstructive sleep apnea treated with CPAP, hypothyroidism, coronary disease, acid reflux, bipolar disorder, borderline personality disorder, arthritis, remote pseudotumor cerebri, chronic obstructive pulmonary disease, chronic bronchitis, and history of atherosclerotic cerebrovascular disease with remote left side weakness reversible ischemic neurologic deficit (this is RIND episode) and [name] admission 03/08/2008 for left occipital stroke with partial right homonymous hemianopsia. He is now admitted 07/08/2011 and is referred for evaluation of dizziness and paresthesias. History comes from the patient, his hospital chart, and his hospital records.   Patient was brought to the Emergency Room 07/08/2011 with history associated with prolonged bending over with shoes with a shoe horn, he felt dizzy and generally weak, felt presyncopal, so he lay back on his bed with feeling weak and with tingling, numbness of arms and hands. He activated his Lifeline was brought into the hospital.  His symptoms were somewhat better on the 14th and described as resolved on the 15th when  he was examined. He denies any prior similar spells. He does report problems with feeling lightheaded and presyncopal associated with looking upward when on his feet. Admission vital signs included blood pressure 122/60, heart rate 61, respirations 20, and oxygen saturation 98%. Brain MRI scan showed no acute findings. Doppler study of the carotid and vertebral artery was benign.   PHYSICAL EXAMINATION: Patient is a well-developed and morbidly overweight white gentleman examined  lying semisupine in no apparent distress. He was normocephalic without evidence of trauma and his neck was supple with decrease of cervical range of motion for age. His mental status was normal, though cognitive testing was not performed in detail. He was alert and oriented with clear speech and normal expression and was lucid and a good historian with normal affect. Cranial nerve examination was symmetric and normal including eye movements and including visual fields which were full to finger count for each eye. Motor examination showed normal tone throughout and showed full power proximally and distally throughout. Extremity coordination was normal to finger-to-nose testing and to hand and foot rapid successive movements. His gait was not tested.   IMPRESSION:  1. Remote left side weakness reversible ischemic neurology deficit.  2. 03/08/2008 right side partial homonymous hemianopsia found associated with area of nonhemorrhagic infarction of the left occipital lobe on brain MRI scan.  3. History of lightheaded dizziness, brief syncope associated with neck extension looking upward, consistent with vertebrobasilar insufficiency.  4. Episode of lightheadedness dizziness, and presyncope followed by generalized weakness in upper extremities paresthesias on 11/65/7903, duplicated by prolonged bending over with neck somewhat extended backward, resolved, and consistent with vertebrobasilar insufficiency.   RECOMMENDATIONS:  1. I agree with his present work-up in hospital including imaging and laboratory studies.  2. The patient was educated with regard to vertebbobasilar insufficiency (VBI), and was encouraged to avoid bending over.   I appreciate being asked to see this pleasant and interesting gentleman.   ____________________________ Rudell Cobb. Loletta Specter, MD prc:cms D: 07/11/2011 14:07:35 ET T: 07/11/2011 14:33:39 ET JOB#: [id number]  cc: Rudell Cobb. Loletta Specter, MD, <Dictator> Linton Flemings MD ELECTRONICALLY  SIGNED 07/23/2011 17:57

## 2014-09-16 NOTE — Consult Note (Signed)
Brief Consult Note: Diagnosis: Bipolar disorder.   Patient was seen by consultant.   Consult note dictated.   Recommend further assessment or treatment.   Orders entered.   Discussed with Attending MD.   Comments: [name] has a h/o bipolar. He has been a patient of Dr. Kasandra Knudsen. His list of medications includes 21 items. The patient feels stable on current meds. He thinks that [place] works for sleep.   PLAN: 1. I will hold/dc all sleeping pills except for Lunesta.  2. Will increase Geodon from 60 bid to 80 bid.  3. I will hold Tegretol as it causes hyponatremia. Topamax may cointribute to it too. He may need another mood stanbilizer in addition to Geodon that is no antiseizure. Lithium is a logical choice. Will discuss with the patient.   4. I will follow up.  Electronic Signatures: Orson Slick (MD)  (Signed 15-Feb-13 14:10)  Authored: Brief Consult Note   Last Updated: 15-Feb-13 14:10 by Orson Slick (MD)

## 2014-09-16 NOTE — Consult Note (Signed)
Brief Consult Note: Diagnosis: Bipolar disorder.   Patient was seen by consultant.   Consult note dictated.   Recommend further assessment or treatment.   Orders entered.   Discussed with Attending MD.   Comments: [name] has a h/o bipolar. He has been a patient of Dr. Kasandra [name]. His list of medications includes 21 items. The patient feels stable on current meds. We made some medication adjustment yesterday. He slept well with Lunesta alone. He seems to tolerate higher dose of Geodon well but believes that Geodon in the past elevated his blood pressure (this could have been Effexor?). I expect that sodium will normalize while he is off tegretol. It is uncertain if his mood will remain stable w/o tegretol.   MSE: Alert, oriented, pleasant, funny, no suicidal or homicidal ideations.    PLAN: 1. Will continue Lunesta and higher dose Geodon to stabilize mood. So far BP stay stable.   2. I will hold Tegretol as it causes hyponatremia. Topamax may cointribute to it too. He may need another mood stanbilizer in addition to Geodon that is no antiseizure. Lithium is a logical choice. Will discuss with the patient.   3. I will follow up.  Electronic Signatures: Orson Slick (MD)  (Signed 16-Feb-13 22:11)  Authored: Brief Consult Note   Last Updated: 16-Feb-13 22:11 by Orson Slick (MD)

## 2014-09-16 NOTE — Consult Note (Signed)
Brief Consult Note: Diagnosis: Bipolar disorder.   Patient was seen by consultant.   Recommend further assessment or treatment.   Orders entered.   Discussed with Attending MD.   Comments: [name] has a h/o bipolar. He has been a patient of Dr. Kasandra Knudsen. His list of medications includes 21 items. The patient feels stable on current meds. We made some medication adjustment yesterday. He slept well with Lunesta alone. He seems to tolerate higher dose of Geodon well but believes that Geodon in the past elevated his blood pressure (this could have been Effexor?).   As expected, Na level normalized after Tegretol discontinued. It is unknown if his mood will remain stable in the absence of Tegretol. There is not a good substitution. We discussed it with the patient at lenght. He is uncertain what to do.    MSE: Alert, oriented, pleasant, funny, no suicidal or homicidal ideations.    PLAN: 1. Will continue Lunesta and higher dose Geodon to stabilize mood. So far BP stay stable.   2. I will hold Tegretol as it causes hyponatremia.   3. I will follow up.  Electronic Signatures: Orson Slick (MD)  (Signed 18-Feb-13 00:20)  Authored: Brief Consult Note   Last Updated: 18-Feb-13 00:20 by Orson Slick (MD)
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
## 2014-09-23 NOTE — Discharge Summary (Signed)
PATIENT NAME:  [name], [name] MR#:  [id number] DATE OF BIRTH:  1945-09-14  DATE OF ADMISSION:  08/20/2014 DATE OF DISCHARGE:  08/23/2014  HISTORY OF PRESENT ILLNESS:  For a detailed note, please take a look at the history and physical done on admission by Dr. Anselm Jungling.   DIAGNOSES AT DISCHARGE: Sepsis secondary to urinary tract infection, urinary tract infection secondary to Escherichia coli, urinary retention, hypertension, hypothyroidism, neuropathy, history of coronary artery disease.   DIET: The patient is being discharged on a low sodium, low fat diet.   ACTIVITY: As tolerated.   FOLLOWUP:  With Dr. Elijio Miles in the next 1 to 2 weeks.  DISCHARGE MEDICATIONS: Amlodipine 10 mg daily, Celebrex 200 mg b.i.d., lorazepam 2 mg at bedtime as needed, Ranexa 5 mg b.i.d., potassium 20  mEq daily, gabapentin 4 mg t.i.d., Imdur 60 mg daily, Flexeril 10 mg t.i.d., Abilify 10 mg daily, aspirin 81 mg daily, atorvastatin 40 mg at bedtime, bupropion 100 mg daily, cetirizine 10 mg daily, Colace 1 mg t.i.d., ferrex 150 mg daily, Advair 250/50 1 puff b.i.d., hydralazine 25 mg b.i.d., Synthroid 100 mcg daily, magnesium oxide 400 mg daily, metoprolol tartrate 50 mg b.i.d., Plavix 75 mg daily, Protonix 40 mg daily, Ramipril 10 mg daily, Singulair 10 mg daily, ziprasidone 60 mg b.i.d., Lasix 20 mg b.i.d., Ambien 1/2 tablet to 1 tablet at bedtime as needed, Belsomra 10 mg at bedtime as needed, tizanidine 2 mg t.i.d., tramadol 50 mg 2 tablets q.i.d. as needed for pain, Bactrim double strength 1 tablet b.i.d. x 5 days and Flomax 0.4 mg daily.   HOSPITAL COURSE: This is a 69 year old male with medical problems as mentioned above, presented to the hospital due to fever, tachycardia, and noted to have a urinary tract infection.  1.  Sepsis. This was a presuming diagnosis as patient presented with fever, tachycardia, and elevated lactic acid. His urinalysis was abnormal. This was thought to be secondary to urinary tract  infection. The patient was started on IV ceftriaxone for the UTI.  Also given some IV fluids. After getting some fluids and IV antibiotics, the patient's lactic acid has now resolved. He is afebrile and hemodynamically stable over the past 2 days. His urine cultures grew out Escherichia coli which was sensitive to ceftriaxone, although the patient is now presently being discharged on oral Bactrim.  2.  Urinary tract infection.  This was likely the cause of the patient's sepsis. The urine culture has grown out Escherichia coli. It was sensitive to IV ceftriaxone which the patient received in the hospital. The patient now is being discharged on oral Bactrim as stated.  3.  Urinary retention. This is secondary to probably BPH. The patient has had a history of BPH. He was on Flomax before, but had been discontinued recently. The patient had 2 episodes of significant urinary retention with greater than 600 mL of urine in his bladder.  He had to have in and out catheterization twice. He was started on Flomax and this has since then resolved. The patient possibly could benefit from urology followup as an outpatient if needed.  4.  Hypokalemia. The patient's potassium has since then been supplemented and improved and resolved. His magnesium level was normal.  5.  History of coronary artery disease. The patient had no acute chest pain. He was maintained on his aspirin, Plavix, beta blocker, statin, Imdur, and Ranexa. He will resume that upon discharge.  6.  Hypothyroidism. The patient was maintained on his Synthroid.  He will  resume that. 7.  Neuropathy. The patient was begun on his Neurontin and he will resume that upon discharge.   CODE STATUS: The patient is a full code.   DISPOSITION: He is being discharged to a skilled nursing facility for ongoing care.   Time spent on discharge is 40 minutes.   ____________________________ Belia Heman. Verdell Carmine, MD vjs:sp D: 08/23/2014 09:24:03 ET T: 08/23/2014 09:41:27  ET JOB#: [id number]  cc: Belia Heman. Verdell Carmine, MD, <Dictator> Henreitta Leber MD ELECTRONICALLY SIGNED 08/30/2014 16:21

## 2014-09-23 NOTE — Consult Note (Signed)
PATIENT NAME:  [name], BOND MR#:  [id number] DATE OF BIRTH:  12-05-1945  DATE OF CONSULTATION:  07/02/2014    REFERRING PHYSICIAN:   CONSULTING PHYSICIAN:  Dionisio David, MD  INDICATION FOR CONSULTATION: Elevated troponin.   HISTORY OF PRESENT ILLNESS: This is a 69 year old white male with a past medical history of hypertension, COPD, bipolar disorder, and diabetes mellitus, who apparently fell off his commode and was brought to the Emergency Room. He denies any chest pain or shortness of breath. He apparently had trouble with his balance and, when he came to the Emergency Room, his troponins were mildly elevated. Thus, I was asked to evaluate the patient. The patient has a past medical history of hypertension, hyperlipidemia, bipolar disorder, morbid obesity, diet controlled diabetes, obstructive sleep apnea, anxiety disorder, diabetic neuropathy, and coronary artery disease.   SOCIAL HISTORY: No EtOH abuse or smoking.   ALLERGIES: DILAUDID, MORPHINE, PENICILLIN AND TETRACYCLINE.   FAMILY HISTORY: His father died of myocardial infarction.   PHYSICAL EXAMINATION:  GENERAL: He is alert and oriented x 3, in no acute distress right now.  VITALS: His blood pressure is 117/52, respirations 20, pulse 64, temperature 98.2, saturation 95.  NECK: No JVD.  LUNGS: Clear.  HEART: Regular rate and rhythm. Normal S1, S2. No audible murmur.  ABDOMEN: Soft, nontender, positive bowel sounds.  EXTREMITIES: No pedal edema.  NEUROLOGIC: Appears to be intact.   LABORATORY AND DIAGNOSTICS: His EKG shows normal sinus rhythm at 70 beats per minute, nonspecific ST-T changes. His BUN is 13, creatinine is 1.48. X-ray of the shoulder showed no dislocation. His CT head showed mild atrophies; no acute changes.  His initial troponin was 0.13; the followup troponin actually came down to 0.08 and 0.06.   ASSESSMENT AND PLAN: Very mildly elevated troponin, which is kind of chronic for him. The patient has been  evaluated by [facility] in the office; had nonobstructive coronary artery disease. He does have history of having balance problems, and he just fell off the commode. Advise discontinuation of his Lasix for now. We will get an echocardiogram to look at his wall motion. He may be a little bit having prerenal azotemia; thus we will hold the Lasix right now. He denies any chest pain or shortness of breath, so we will discontinue Lovenox and change aspirin to 81 mg from 325 mg, since he is on Plavix.   Thank you very much for the referral.   ____________________________ Dionisio David, MD sak:MT D: 07/02/2014 08:41:59 ET T: 07/02/2014 09:41:01 ET JOB#: [id number]  cc: Dionisio David, MD, <Dictator> Dionisio David MD ELECTRONICALLY SIGNED 07/26/2014 12:15

## 2014-09-23 NOTE — H&P (Signed)
PATIENT NAME:  [name], [name] MR#:  [id number] DATE OF BIRTH:  1946-04-06  DATE OF ADMISSION:  08/20/2014  PRIMARY CARE PHYSICIAN: Alfredia Ferguson A. Elijio Miles, MD  PRIMARY CARDIOLOGIST: Dionisio David, MD   CHIEF COMPLAINT: Back pain, fever.  REFERRING EMERGENCY ROOM PHYSICIAN: Doren Custard A. Joni Fears, MD  HISTORY OF PRESENTING ILLNESS: This is a 69 year old male with a history of hypertension, hyperlipidemia, morbidly obese, asthma, bipolar disorder, diabetes, anxiety, coronary artery disease, says that he felt a little weak and fell down 2 days ago, came to Emergency Room and he was sent home from here as everything was fine. Today morning, he started shaking. He also had some pain in his lower back and had some fever, so came to Emergency Room. He denies any problem with the urine but he was noted to have positive urinalysis and dark urine, so given to hospitalist team for possibility of UTI as he also had fever and tachycardia in the ER, and elevated white cell count.  REVIEW OF SYSTEMS:  CONSTITUTIONAL: Positive for fatigue and fever, generalized weakness. No weight loss or weight again.  EYES: No blurry or double vision, discharge, or redness.  EARS, NOSE, THROAT: No tinnitus, ear pain, or hearing loss.  RESPIRATORY: No cough, wheezing, hemoptysis, or shortness of breath.  CARDIOVASCULAR: No chest pain, orthopnea, edema, arrhythmia, palpitation.  GASTROINTESTINAL: No nausea, vomiting, diarrhea, abdominal pain.  GENITOURINARY: No dysuria, hematuria, increased frequency.  ENDOCRINE: No heat or cold intolerance. No excessive sweating.  SKIN: No acne, rashes, or lesions.  MUSCULOSKELETAL: No pain or swelling in the joints, but has lower back pain.  NEUROLOGICAL: No numbness, weakness, tremor, or vertigo.  PSYCHIATRIC: No anxiety, insomnia, bipolar disorder.   PAST MEDICAL HISTORY:  1.  Hypertension.  2.  Hyperlipidemia.  3.  Asthma.  4.  Bipolar disorder.  5.  Morbid obesity.  6.   Diet-controlled diabetes.  7.  Obstructive sleep apnea.  8.  Anxiety and depression.  9.  Coronary artery disease and CABG twice.   SOCIAL HISTORY: He does not smoke. No alcohol. No illegal drug use. Lives at home. Ambulates with a walker.   FAMILY HISTORY: Father and mother both died. Mother died from complications of diabetes and cerebellar hemorrhage. Father died from MI.  HOME MEDICATION: 1.  Zolpidem 10 mg take 1/2 tablet once a day at nighttime.  2.  Ziprasidone 60 mg oral 1 capsule 2 times a day.  3.  Tramadol 50 mg oral 2 tablets 4 times a day.  4.  Tizanidine 2 mg oral tablet 3 times a day.  5.  Ranexa 500 mg oral tablet 2 times a day.  6.  Ramipril 10 mg oral capsule once a day.  7.  Potassium chloride 20 mEq once a day.  8.  Pantoprazole 40 mg once a day.  9.  Montelukast 10 mg orally once a day.  10.  Metoprolol tartrate 50 mg 2 times a day.  11.  Magnesium oxide 400 mg once a day.  12.  Lorazepam 2 mg oral tablet once a day.  13.  Levothyroxine 100 mcg oral once a day.  14.  Isosorbide mononitrate once a day.  15.  Hydralazine 25 mg 2 times a day.  16.  Gabapentin 400 mg 3 times a day.  17.  Furosemide 20 mg 2 times a day.  18.  Docusate sodium 100 mg 3 times a day.  19.  Cyclobenzaprine 10 mg oral 3 times a day.  20.  Clopidogrel 75  mg oral once a day.  21.  Cetirizine 10 mg oral tablet once a day.  22.  Celebrex 200 mg oral 2 times a day.  23.  Bupropion 100 mg oral once a day.  24.  Atorvastatin 40 mg oral once a day.  25.  Aspirin 81 mg once a day.  26.  Amlodipine 10 mg once a day.  27.  Advair Diskus 1 puff inhalation 2 times a day.  28.  Abilify 10 mg oral once a day.   PHYSICAL EXAMINATION:  VITAL SIGNS: In ER, temperature 103, pulse 105, respirations 16, blood pressure 106/69, and pulse oximetry is 92 on room air.  GENERAL: The patient is fully alert and oriented, morbidly obese, in no acute distress.  HEENT: Head and neck atraumatic. Conjunctivae pink.  Oral mucosa moist. Sclerae anicteric.  NECK: Supple. No JVD.  RESPIRATORY: Bilateral equal and clear air entry.  CARDIOVASCULAR: S1, S2 present, regular. No murmur.  ABDOMEN: Soft, obese, nontender. Bowel sounds present. No organomegaly. BACK: There is tenderness on local palpation on the lower spinal area, but no costovertebral angle tenderness. LEGS: Mild edema. JOINTS: No swelling or tenderness.  NEUROLOGIC: Power is 5/5. Follows commands and moves all 4 limbs. No tremor or rigidity. Sensation is intact. Reflexes intact.  PSYCHIATRIC: Does not appear in any acute psychiatric illness at this time.  IMPORTANT LABORATORY RESULTS: 1.  Glucose 94, BUN 20, creatinine 1.5, sodium 144, potassium 4.1, chloride 109, CO2 of 25, and phosphorus 2.3, magnesium 1.6, lactic acid 3.9. 2.  Total protein 6.4, albumin 3.2, bilirubin 0.6, alkaline phosphatase 87, SGOT 27, and SGPT is 15. 3.  Troponin is less than 0.03. 4.  WBC 11.8, hemoglobin 11.6, platelet count 241,000, MCV 75. 5.  INR is 1.1. 6.  Urinalysis is positive with 112 WBCs and 3+ leukocyte esterase and 3+ bacteremia. 7.  On ABG, pH is 7.44, pCO2 of 34, and pO2 is 59. FiO2 is 28.   IMAGING: Chest x-ray, portable, showed no significant changes from prior study, mild bibasilar atelectasis.   ASSESSMENT AND PLAN: A 69 year old male who came to hospital with fever and shaking and noted to have a urinary tract infection and sepsis.  1.  Sepsis. This is evident by tachycardia, elevated white blood cell count, and lactic acidosis, fever. This is secondary to a urinary tract infection. We will continue Rocephin and get the cultures, and also given intravenous fluid by Emergency Room physician.  2.  Urinary tract infection. Will get the urine culture and give him Rocephin for now.  3.  Lactic acidosis. Lactic acid is slightly elevated. Most likely this is because of his urinary tract infection and sepsis. We will give intravenous fluid and continue  monitoring.  4.  Mild renal insufficiency. Creatinine is slightly worse than before. Continue intravenous fluid and monitor.  5.  Hypomagnesemia. Replace.  6.  History of coronary artery disease. Continue atorvastatin, aspirin, Plavix, isosorbide mononitrate, and ramipril.  7.  Hypothyroidism. Continue levothyroxine.  8.  Asthma. Continue Advair Diskus. Currently there are no exacerbation symptoms. Continue montelukast.  TOTAL TIME SPENT ON THIS ADMISSION: 50 minutes.    ____________________________ Ceasar Lund Anselm Jungling, MD vgv:ST D: 08/20/2014 20:18:33 ET T: 08/20/2014 21:36:06 ET JOB#: [id number]  cc: Ceasar Lund. Anselm Jungling, MD, <Dictator> Sheikh A. Elijio Miles, MD Vaughan Basta MD ELECTRONICALLY SIGNED 08/30/2014 0:37

## 2014-09-23 NOTE — H&P (Signed)
PATIENT NAME:  [name], [name] MR#:  [id number] DATE OF BIRTH:  07-01-1945  DATE OF ADMISSION:  07/02/2014  PRIMARY CARE PROVIDER:  Alfredia Ferguson A. Elijio Miles, MD  CARDIOLOGIST:  Dionisio David, MD  CHIEF COMPLAINT:  Fall.   HISTORY OF PRESENT ILLNESS:  This 69 year old Caucasian male patient with history of lower extremity weakness, who uses a walker, with hypertension, COPD, bipolar, diabetes, presents to the Emergency Room after he fell off his commode. The patient mentions that 2 weeks prior when he was in the kitchen making a sandwich, the patient lost his balance and fell down onto the right side, hurting his right hip and shoulder. Since then, he has had limited mobility secondary to pain of the right hip. Today, he was on his bedside commode trying to get up, then slid down due to weakness and fell onto the floor and could not get up. He pressed his Life Alert button. EMS arrived and brought him to the Emergency Room. Here, the patient complains mainly of some right shoulder and hip pain, which has been going on for 2 weeks. No chest pain or shortness of breath. EKG was done by the ER physician, which showed some ST depression in the lateral leads along with troponin elevated at 0.13. These are new findings for the patient, and he is being admitted to the hospitalist service. The patient has not had any chest pain or shortness of breath in the recent past. He mentions that his last cardiac catheterization was in the year prior and had no stents placed.   PAST MEDICAL HISTORY:  1.  Hypertension.  2.  Hyperlipidemia.  3.  COPD.  4.  Bipolar disorder.  5.  Morbid obesity.  6.  Diet-controlled diabetes.  7.  Obstructive sleep apnea.  8.  Anxiety and depression.  9.  Diabetic neuropathy.  10.  CAD.   SOCIAL HISTORY:  The patient does not smoke. No alcohol. No illicit drugs. Lives at home alone. Ambulates with a walker.   CODE STATUS:  Full code.   ALLERGIES:  DILAUDID, MORPHINE, PENICILLIN,  TETRACYCLINE.   FAMILY HISTORY:  Father and mother both deceased. Mother died from complication of diabetes and cerebral hemorrhage. Father died from MI.   HOME MEDICATIONS: 1.  Abilify 10 mg daily.  2.  Amlodipine 10 mg daily.  3.  Aspirin 325 mg daily.  4.  Atorvastatin 40 mg daily.  5.  Bupropion 100 mg daily.  6.  Celebrex 200 mg 2 times a day.  7.  Cetirizine 10 mg daily.  8.  Flexeril 10 mg orally 3 times a day.  9.  Colace 100 mg orally 3 times a day.  10.  Ziprasidone 60 mg 2 times a day.  11.  Lasix 20 mg 2 times a day.  12.  Gabapentin 400 mg 3 times a day.  13.  Hydralazine 25 mg orally 4 times a day.  14.  Isosorbide mononitrate 60 mg daily.  15.  Levothyroxine 100 mcg orally daily.  16.  Lorazepam 2 mg orally at bedtime.  17.  Lunesta 3 mg orally at bedtime.  18.  Magnesium oxide 400 mg orally daily.  19.  Meclizine 25 mg orally daily as needed.  20.  Melatonin 5 mg daily at bedtime.  21.  Metoprolol 50 mg 2 times a day. 22.  Nexium 40 mg 2 times a day.  23.  Plavix 75 mg daily.  24.  Potassium chloride 20 mEq daily.  25.  Ramipril  10 mg daily.  26.  Ranexa 500 mg 2 times a day.  27.  Singulair 10 mg at bedtime.   REVIEW OF SYSTEMS: CONSTITUTIONAL:  Complains of generalized fatigue.  EYES:  No blurred vision, pain, or redness.  EARS, NOSE, AND THROAT:  No tinnitus, ear pain, or hearing loss.  RESPIRATORY:  No cough or chest pain.  CARDIOVASCULAR:  No chest pain, orthopnea, or edema.  GASTROINTESTINAL:  No nausea, vomiting, diarrhea, or abdominal pain.  GENITOURINARY:  No dysuria, hematuria, or frequency.  ENDOCRINE:  No polyuria, nocturia, or thyroid problems.  HEMATOLOGIC AND LYMPHATIC:  No anemia or easy bruising or bleeding.  INTEGUMENTARY:  No acne, rash, or lesion.  MUSCULOSKELETAL:  Has right hip and shoulder pain.  NEUROLOGIC:  No focal numbness or weakness.  PSYCHIATRIC:  Has anxiety and depression.   PHYSICAL EXAMINATION: VITAL SIGNS:   Temperature 98.7, pulse 84, blood pressure 140/54, saturating 96% on room air.  GENERAL:  Obese, Caucasian male patient lying in bed, overall seems comfortable, conversational, cooperative with exam.  PSYCHIATRIC:  Alert and oriented x 3. Mood and affect are appropriate. Judgment is intact.  HEENT:  Atraumatic, normocephalic. Oral mucosa is moist and pink. External ears and nose are normal. No pallor. No icterus. Pupils are bilaterally equal and reactive to light.  NECK:  Supple. No thyromegaly. No palpable lymph nodes. Trachea is midline. No carotid bruit or JVD. CARDIOVASCULAR:  S1 and S2 without any murmurs. Peripheral pulses 2+. No edema.  RESPIRATORY:  Normal work of breathing. Clear to auscultation on both sides.  GASTROINTESTINAL:  Soft abdomen, nontender. Bowel sounds present. No organomegaly palpable.  SKIN:  Warm and dry. No petechiae, rash, or ulcers.  MUSCULOSKELETAL:  No joint swelling, redness, or effusion of the large joints. Normal muscle tone.  NEUROLOGICAL:  Motor strength is 5/5 in upper and lower extremities with limited testing in the right lower extremity due to pain.  LYMPHATIC:  No cervical lymphadenopathy.   EKG shows normal sinus rhythm with some T-wave inversions in the lateral leads, which were not present in September.   Glucose is 101, BUN 13, creatinine 1.48, sodium 145, and potassium 3.2. Troponin is 0.13. WBC is 6.8, hemoglobin 10.9, and platelets 310,000.   CT scan of the head without contrast showed no acute intracranial abnormalities. Degenerative changes in the cervical spine.   X-ray of the right shoulder, hip, and knee showed some arthritis but no fracture or dislocation.   ASSESSMENT AND PLAN: 1.  Elevated troponin with new EKG changes. It is unclear of what the etiology of these findings is at this point. The patient does not have any chest pain or shortness of breath and no symptoms in the recent past. His CK level is normal. We will get 2 more sets of  cardiac enzymes. The patient could have some chronic elevation. EKG changes could be old. If there is any further worsening of troponin, we will start him on a heparin drip. We will get an echocardiogram at this point and consult cardiology. The patient will be on aspirin and statin. Nitroglycerin p.r.n.  2.  Fall. The patient does have diabetic neuropathy and seems to have gait abnormalities, using a walker. This is a mechanical fall. He did not have any syncope. No new weakness. The patient's CT scan shows nothing acute. We will consult physical therapy. He may need skilled nursing inpatient facility or physical therapy with home health.  3.  Hypertension. Continue medications.  4.  Chronic kidney disease  stage III, stable.  5.  Chronic obstructive pulmonary disease, stable. Continue inhalers.  6.  Deep vein thrombosis prophylaxis with Lovenox.   CODE STATUS:  Full code.   TIME SPENT TODAY ON THIS CASE:  45 minutes.   ____________________________ Leia Alf Kahlin Mark, MD srs:nb D: 07/02/2014 00:45:46 ET T: 07/02/2014 01:31:31 ET JOB#: [id number]  cc: Alveta Heimlich R. Kristoffer Bala, MD, <Dictator> Sheikh A. Elijio Miles, MD Dionisio David, MD Neita Carp MD ELECTRONICALLY SIGNED 07/02/2014 4:02

## 2014-09-23 NOTE — Discharge Summary (Signed)
PATIENT NAME:  [name], CASELLI MR#:  [id number] DATE OF BIRTH:  1945-11-29  DATE OF ADMISSION:  07/02/2014 DATE OF DISCHARGE:  07/03/2014  DISPOSITION:  [facility]    PRIMARY PHYSICIAN:  Sheikh A. Elijio Miles, MD  CONSULTATIONS: Cardiology.   DISCHARGE DIAGNOSES:  1.  Fall secondary to mechanical fall.  2.  Hypertension.  3.  Chronic kidney disease stage 3.  4.  Chronic obstructive pulmonary disease.  5.  Elevated troponin secondary to the fall. 6.  History of coronary artery disease. Elevated troponins, likely secondary to demand ischemia.  7.  Nonobstructive coronary artery disease.   HOSPITAL COURSE: This is a 69 year old white male with history of COPD, bipolar disorder, high blood pressure and coronary artery disease who came in because of fall. The patient lost balance and had a fall at home and the patient has other history of diet-controlled diabetes, sleep apnea, anxiety, diabetic neuropathy, bipolar disorder, hyperlipidemia, and coronary artery disease. The patient's vitals were normal on admission.  His EKG showed no ST-T changes and 70 beats per minutes. The patient's chest x-ray showed no pneumonia. The patient's troponins were slightly up at 0.13 and down-trended to 0.08. The patient was admitted to telemetry because of elevated troponins. Seen by cardiology, Dr. Neoma Laming and the patient had a full cardiac workup as an outpatient including coronary CTs which were normal., So Dr. Humphrey Rolls said stop the Lasix to see if that is causing him to fall and the patient had no further cardiac work-up.  They thought this was not heart related. The patient, right now is on aspirin and Plavix. The patient has no trouble breathing, no chest pains and the patient's echocardiogram is done and it showed EF of 50% to 60% with normal systolic function, impaired diastolic filling.  Next diagnosis includes:  Falls. The patient has mild urinary tract infection, but other than that the patient is  asymptomatic. He did not have any abnormalities on the head CT. Head CT showed diffuse cerebral atrophy. No acute stroke or hemorrhage. The patient was seen by physical therapy regarding his falls deconditioning. The patient had a history of multiple falls before, so physical therapy saw the patient  and they recommended short term rehab.  The patient will be going to [facility].   History of fall, the patient had multiple imaging in the Emergency Room. CT cervical spine did not show any acute fracture. The patient shoulder x-rays on the right side, no evidence of fracture.  Hip x-ray and knee x-ray also did not show any traction on the right side.  The patient's CBC was normal. The patient's potassium was low which was replaced, creatinine 1.48, GFR 50.  He has chronic kidney disease stage 3. The patient has history of depression and he is on multiple medications for his bipolar depression. He has a history of sleep apnea and diet-controlled diabetes and morbid obesity. The patient has a history of nonobstructive coronary artery disease. He is on aspirin, beta blockers and statins. The patient has hypothyroidism. He is on levothyroxine 100 mcg daily.   DISCHARGE DIAGNOSES:  1.  Chest pain secondary to falls.  2.  Dizziness.  3.  Coronary artery disease.  4.  Depression.   DISCHARGE MEDICATIONS: Amlodipine 10 mg daily, atorvastatin 40 mg daily, Bupropion 100 mg p.o. daily, Lunesta 3 mg p.o. daily, magnesium oxide 400 mg p.o. daily, levothyroxine 100 mg p.o. daily, Singulair 10 mg p.o. daily, aspirin 325 mg p.o. daily, Celebrex 200 mg p.o. b.i.d.,  Ativan 2 mg p.o. daily, Ramipril 10 mg p.o. daily, Ranexa 500 mg p.o. b.i.d., Pradaxa 150 mg p.o. daily, meclizine 25 mg as needed, KCl 20 mEq daily, docusate 100 mg p.o. t.i.d., Neurontin 400  mg p.o. t.i.d., Imdur 60 mg p.o. daily, metoprolol tartrate 50 mg p.o. b.i.d., Plavix 75 mg p.o. daily, cetirizine 10 mg p.o. daily, melatonin 5 mg p.o. daily,  hydralazine 25 mg p.o. 4 times daily.  The patient's Lasix is stopped.   Continue the medications at discharge.  He is on:  1.  Flexeril 10 mg p.o. daily.  2.  Prednisone 20 mg p.o. 2 tablets daily.  3.  ziprasidone    60 mg p.o. b.i.d. 4.  Fluticasone with salmeterol 250/50 one puff b.i.d.   5.  Protonix 40 mg  p.o. daily.  6.  Abilify 10 mg p.o. daily.   The patient will follow up with Dr. Elijio Miles in about a week to 10 days.   DISCHARGE VITAL SIGNS: Temperature 98.6, heart rate 74, blood pressure 116/68, saturations 98% on room air.  GENERAL: The patient is alert, awake, oriented, elderly male, not in distress,  HEAD: Atraumatic, normocephalic. EYES:  Pupils equal, round and reactive to light. EARS, NOSE, AND THROAT:  No tympanic membrane congestion. No turbinate hypertrophy. No oropharyngeal erythema.  NECK: Supple. No JVD.  CARDIOVASCULAR: S1, S2 regular. No murmurs.  LUNGS: Clear to auscultation. No wheeze noted.  GASTROINTESTINAL AND ABDOMEN: Soft, nontender, nondistended. Bowel sounds present.  EXTREMITIES: No extremity edema. No cyanosis, no clubbing.  NEUROLOGICAL: Alert, awake, oriented, no focal neurological deficit.   TIME SPENT: More than 30 minutes.   ALLERGIES: THE PATIENT IS ALLERGIC TO MORPHINE, PENICILLIN AND TETRACYCLINE.    ____________________________ Epifanio Lesches, MD sk:at D: 07/03/2014 11:55:03 ET T: 07/03/2014 12:35:23 ET JOB#: [id number]  cc: Epifanio Lesches, MD, <Dictator> Sheikh A. Elijio Miles, MD Epifanio Lesches MD ELECTRONICALLY SIGNED 07/04/2014 14:33

## 2014-10-20 ENCOUNTER — Emergency Department
Admission: EM | Admit: 2014-10-20 | Discharge: 2014-10-21 | Disposition: A | Discharge status: Home / Self Care | Payer: Medicare Other | Attending: Emergency Medicine | Admitting: Emergency Medicine

## 2014-10-20 ENCOUNTER — Other Ambulatory Visit: Payer: Self-pay

## 2014-10-20 ENCOUNTER — Encounter: Payer: Self-pay | Admitting: *Deleted

## 2014-10-20 ENCOUNTER — Emergency Department: Payer: Medicare Other

## 2014-10-20 DIAGNOSIS — S0990XA Unspecified injury of head, initial encounter: Secondary | ICD-10-CM | POA: Diagnosis not present

## 2014-10-20 DIAGNOSIS — E119 Type 2 diabetes mellitus without complications: Secondary | ICD-10-CM | POA: Insufficient documentation

## 2014-10-20 DIAGNOSIS — Y92000 Kitchen of unspecified non-institutional (private) residence as  the place of occurrence of the external cause: Secondary | ICD-10-CM | POA: Insufficient documentation

## 2014-10-20 DIAGNOSIS — S5002XA Contusion of left elbow, initial encounter: Secondary | ICD-10-CM | POA: Insufficient documentation

## 2014-10-20 DIAGNOSIS — S51002A Unspecified open wound of left elbow, initial encounter: Secondary | ICD-10-CM | POA: Insufficient documentation

## 2014-10-20 DIAGNOSIS — Z87891 Personal history of nicotine dependence: Secondary | ICD-10-CM | POA: Diagnosis not present

## 2014-10-20 DIAGNOSIS — Y9389 Activity, other specified: Secondary | ICD-10-CM | POA: Diagnosis not present

## 2014-10-20 DIAGNOSIS — S50311A Abrasion of right elbow, initial encounter: Secondary | ICD-10-CM | POA: Insufficient documentation

## 2014-10-20 DIAGNOSIS — T148XXA Other injury of unspecified body region, initial encounter: Secondary | ICD-10-CM

## 2014-10-20 DIAGNOSIS — R531 Weakness: Secondary | ICD-10-CM | POA: Diagnosis present

## 2014-10-20 DIAGNOSIS — W01198A Fall on same level from slipping, tripping and stumbling with subsequent striking against other object, initial encounter: Secondary | ICD-10-CM | POA: Diagnosis not present

## 2014-10-20 DIAGNOSIS — Z880 Allergy status to penicillin: Secondary | ICD-10-CM | POA: Diagnosis not present

## 2014-10-20 DIAGNOSIS — I10 Essential (primary) hypertension: Secondary | ICD-10-CM | POA: Diagnosis not present

## 2014-10-20 DIAGNOSIS — Y998 Other external cause status: Secondary | ICD-10-CM | POA: Insufficient documentation

## 2014-10-20 DIAGNOSIS — W19XXXA Unspecified fall, initial encounter: Secondary | ICD-10-CM

## 2014-10-20 LAB — CBC WITH DIFFERENTIAL/PLATELET
Basophils Absolute: 0.1 10*3/uL (ref 0–0.1)
Basophils Relative: 1 %
EOS PCT: 5 %
Eosinophils Absolute: 0.4 10*3/uL (ref 0–0.7)
HEMATOCRIT: 39.9 % — AB (ref 40.0–52.0)
Hemoglobin: 12.5 g/dL — ABNORMAL LOW (ref 13.0–18.0)
LYMPHS PCT: 22 %
Lymphs Abs: 1.9 10*3/uL (ref 1.0–3.6)
MCH: 25.5 pg — ABNORMAL LOW (ref 26.0–34.0)
MCHC: 31.3 g/dL — ABNORMAL LOW (ref 32.0–36.0)
MCV: 81.4 fL (ref 80.0–100.0)
MONOS PCT: 8 %
Monocytes Absolute: 0.7 10*3/uL (ref 0.2–1.0)
NEUTROS ABS: 5.4 10*3/uL (ref 1.4–6.5)
Neutrophils Relative %: 64 %
PLATELETS: 264 10*3/uL (ref 150–440)
RBC: 4.9 MIL/uL (ref 4.40–5.90)
RDW: 20.3 % — ABNORMAL HIGH (ref 11.5–14.5)
WBC: 8.5 10*3/uL (ref 3.8–10.6)

## 2014-10-20 NOTE — ED Provider Notes (Signed)
[facility] Emergency Department Provider Note  ____________________________________________  Time seen: Seen on arrival to the emergency department  I have reviewed the triage vital signs and the nursing notes.   HISTORY  Chief Complaint Weakness and Fall    HPI [name] is a 69 y.o. male history of congestive heart failure on Plavix 70 week all over and fell in his kitchen tonight. Says that he has fallen now 4 times over the last week with is the first time he came to the hospital. He says he fell backwards hitting his left and right elbows and the back of his head. He did not lose consciousness. He did not have any preceding chest pain but did have preceding shortness of breath. He says that he falls often under the similar circumstances. He says that he came to the hospital this time instead of the other 3 times because he said he figured he "should get checked out" at this point. Is not having any shortness of breath or chest pain at this time.Says has had a tetanus shot within the last 5 weeks.   Past Medical History  Diagnosis Date  . Hypertension   . Arthritis   . Stroke   . History of stomach ulcers   . Heart trouble   . Skin cancer   . Diabetes     There are no active problems to display for this patient.   Past Surgical History  Procedure Laterality Date  . Kidney surgery    . Hernia repair    . Gallbladder surgery    . Cardiac surgery    . Heart bypass      Current Outpatient Rx  Name  Route  Sig  Dispense  Refill  . ABILIFY 10 MG tablet               . ADVAIR DISKUS 100-50 MCG/DOSE AEPB               . amLODipine (NORVASC) 10 MG tablet               . atorvastatin (LIPITOR) 40 MG tablet               . buPROPion (WELLBUTRIN SR) 100 MG 12 hr tablet               . clopidogrel (PLAVIX) 75 MG tablet               . Eszopiclone 3 MG TABS               . furosemide (LASIX) 20 MG tablet                . gabapentin (NEURONTIN) 400 MG capsule               . isosorbide mononitrate (IMDUR) 60 MG 24 hr tablet               . levothyroxine (SYNTHROID, LEVOTHROID) 100 MCG tablet               . LORazepam (ATIVAN) 2 MG tablet               . meclizine (ANTIVERT) 25 MG tablet               . metoprolol (LOPRESSOR) 50 MG tablet               . NEXIUM 40 MG capsule               .  potassium chloride SA (K-DUR,KLOR-CON) 20 MEQ tablet               . ramipril (ALTACE) 10 MG capsule               . RANEXA 500 MG 12 hr tablet               . traMADol (ULTRAM) 50 MG tablet               . ziprasidone (GEODON) 60 MG capsule               . zolpidem (AMBIEN) 10 MG tablet                 Allergies Dilaudid; Morphine and related; Penicillins; and Tetracyclines & related  History reviewed. No pertinent family history.  Social History History  Substance Use Topics  . Smoking status: Former Smoker    Quit date: 03/15/1963  . Smokeless tobacco: Never Used  . Alcohol Use: No    Review of Systems Constitutional: No fever/chills Eyes: No visual changes. ENT: No sore throat. Cardiovascular: Denies chest pain. Respiratory: Denies shortness of breath. Gastrointestinal: No abdominal pain.  No nausea, no vomiting.  No diarrhea.  No constipation. Genitourinary: Negative for dysuria. Musculoskeletal: Negative for back pain. Left elbow pain. Skin: Negative for rash. Neurological: Negative for headaches, focal weakness or numbness.  10-point ROS otherwise negative.  ____________________________________________   PHYSICAL EXAM:  VITAL SIGNS: ED Triage Vitals  Enc Vitals Group     BP 10/20/14 2311 109/54 mmHg     Pulse Rate 10/20/14 2311 65     Resp 10/20/14 2311 20     Temp 10/20/14 2311 98.5 F (36.9 C)     Temp Source 10/20/14 2311 Oral     SpO2 10/20/14 2305 97 %     Weight 10/20/14 2311 339 lb (153.769 kg)      Height 10/20/14 2311 5\' 7"  (1.702 m)     Head Cir --      Peak Flow --      Pain Score 10/20/14 2314 4     Pain Loc --      Pain Edu? --      Excl. in [place]? --     Constitutional: Alert and oriented. Well appearing and in no acute distress. Eyes: Conjunctivae are normal. PERRL. EOMI. Head: Atraumatic. Nose: No congestion/rhinnorhea. Mouth/Throat: Mucous membranes are moist.  Oropharynx non-erythematous. Neck: No stridor.   Cardiovascular: Normal rate, regular rhythm. Grossly normal heart sounds.  Good peripheral circulation. Respiratory: Normal respiratory effort.  No retractions. Lungs CTAB. Gastrointestinal: Soft and nontender. No distention. No abdominal bruits. No CVA tenderness. Musculoskeletal: No lower extremity tenderness.  No joint effusions. Mild edema to the bilateral lower 70s. The patient says that this is baseline and he takes Lasix. Left elbow with large posterior hematoma with small overlying skin tear. Patient is able to range fully with only a mild amount of pain. Mild tenderness posteriorly. Neurovascularly intact distally. Neurologic:  Normal speech and language. No gross focal neurologic deficits are appreciated. Speech is normal. No gait instability. Skin:  Skin is warm, dry and intact. No rash noted. Psychiatric: Mood and affect are normal. Speech and behavior are normal.  ____________________________________________   LABS (all labs ordered are listed, but only abnormal results are displayed)  Labs Reviewed  CBC WITH DIFFERENTIAL/PLATELET - Abnormal; Notable for the following:    Hemoglobin 12.5 (*)    HCT 39.9 (*)  MCH 25.5 (*)    MCHC 31.3 (*)    RDW 20.3 (*)    All other components within normal limits  COMPREHENSIVE METABOLIC PANEL - Abnormal; Notable for the following:    Glucose, Bld 101 (*)    BUN 22 (*)    Creatinine, Ser 1.55 (*)    Calcium 8.8 (*)    Albumin 3.3 (*)    ALT 12 (*)    GFR calc non Af Amer 44 (*)    GFR calc Af Amer 51 (*)     All other components within normal limits  GLUCOSE, CAPILLARY - Abnormal; Notable for the following:    Glucose-Capillary 103 (*)    All other components within normal limits  TROPONIN I  TROPONIN I  CBG MONITORING, ED   ____________________________________________  EKG  ED ECG REPORT I, Doran Stabler, the attending physician, personally viewed and interpreted this ECG.   Date: 10/20/2014  EKG Time: 2322  Rate: 60  Rhythm: normal sinus rhythm, 1st degree AV block  Axis: Normal axis  Intervals:none  ST&T Change: No ST elevations or depressions. No STEMI. No abnormal T-wave inversions.  ____________________________________________  RADIOLOGY  bibasal atelectasis with scarring on the chest x-ray. Elbow x-ray with olecranon soft tissue swelling which could be hematoma or bursitis.no evidence of traumatic intracranial injury or fracture on the head CT. ____________________________________________   PROCEDURES    ____________________________________________   INITIAL IMPRESSION / ASSESSMENT AND PLAN / ED COURSE  Pertinent labs & imaging results that were available during my care of the patient were reviewed by me and considered in my medical decision making (see chart for details).  ----------------------------------------- 5:13 AM on 10/21/2014 -----------------------------------------  Patient with fall tonight likely consistent with chronic falling issue at home. No obvious fractures or medical reason for fall. Will be discharged to home. We'll discharge home with Ace wrap for swelling to the left elbow. ____________________________________________   FINAL CLINICAL IMPRESSION(S) / ED DIAGNOSES  Acute fall. Acute left elbow hematoma. Return visit    Orbie Pyo, MD 10/21/14 [phone]

## 2014-10-20 NOTE — ED Notes (Signed)
Pt presents w/ c/o weakness starting tonight. Pt has hx of CHF and he fell tonight. Pt presents w/ generalized edema. Pt has abrasions on bilateral elbows. Pt is normally short of breath and is presently dyspneic.

## 2014-10-21 ENCOUNTER — Emergency Department: Payer: Medicare Other

## 2014-10-21 DIAGNOSIS — S5002XA Contusion of left elbow, initial encounter: Secondary | ICD-10-CM | POA: Diagnosis not present

## 2014-10-21 LAB — COMPREHENSIVE METABOLIC PANEL
ALBUMIN: 3.3 g/dL — AB (ref 3.5–5.0)
ALK PHOS: 97 U/L (ref 38–126)
ALT: 12 U/L — AB (ref 17–63)
AST: 19 U/L (ref 15–41)
Anion gap: 8 (ref 5–15)
BILIRUBIN TOTAL: 0.4 mg/dL (ref 0.3–1.2)
BUN: 22 mg/dL — ABNORMAL HIGH (ref 6–20)
CO2: 24 mmol/L (ref 22–32)
Calcium: 8.8 mg/dL — ABNORMAL LOW (ref 8.9–10.3)
Chloride: 109 mmol/L (ref 101–111)
Creatinine, Ser: 1.55 mg/dL — ABNORMAL HIGH (ref 0.61–1.24)
GFR calc non Af Amer: 44 mL/min — ABNORMAL LOW (ref >60)
GFR, EST AFRICAN AMERICAN: 51 mL/min — AB (ref >60)
Glucose, Bld: 101 mg/dL — ABNORMAL HIGH (ref 65–99)
Potassium: 4.3 mmol/L (ref 3.5–5.1)
SODIUM: 141 mmol/L (ref 135–145)
Total Protein: 6.9 g/dL (ref 6.5–8.1)

## 2014-10-21 LAB — GLUCOSE, CAPILLARY: Glucose-Capillary: 103 mg/dL — ABNORMAL HIGH (ref 65–99)

## 2014-10-21 LAB — TROPONIN I
Troponin I: <0.03 ng/mL (ref <0.031)
Troponin I: <0.03 ng/mL (ref <0.031)

## 2015-01-10 ENCOUNTER — Encounter: Payer: Self-pay | Admitting: Podiatry

## 2015-01-10 ENCOUNTER — Ambulatory Visit (INDEPENDENT_AMBULATORY_CARE_PROVIDER_SITE_OTHER): Payer: Medicare Other | Admitting: Podiatry

## 2015-01-10 DIAGNOSIS — M79676 Pain in unspecified toe(s): Secondary | ICD-10-CM

## 2015-01-10 DIAGNOSIS — B351 Tinea unguium: Secondary | ICD-10-CM

## 2015-01-10 NOTE — Progress Notes (Signed)
Patient ID: [name], male   DOB: [birth year], 69 y.o.   MRN: [id number]  Subjective: 69 y.o. returns the office today for painful, elongated, thickened toenails which he is unable to trim himself. Denies any redness or drainage around the nails. Denies any acute changes since last appointment and no new complaints today. Denies any systemic complaints such as fevers, chills, nausea, vomiting.   Objective: AAO 3, NAD DP/PT pulses palpable, CRT less than 3 seconds Nails hypertrophic, dystrophic, elongated, brittle, discolored 10. There is tenderness overlying the nails 1-5 bilaterally. There is no surrounding erythema or drainage along the nail sites. No open lesions or pre-ulcerative lesions are identified. No other areas of tenderness bilateral lower extremities. No overlying edema, erythema, increased warmth. No pain with calf compression, swelling, warmth, erythema.  Assessment: Patient presents with symptomatic onychomycosis  Plan: -Treatment options including alternatives, risks, complications were discussed -Nails sharply debrided 10 without complication/bleeding. -Discussed daily foot inspection. If there are any changes, to call the office immediately.  -Follow-up in 3 months or sooner if any problems are to arise. In the meantime, encouraged to call the office with any questions, concerns, changes symptoms.   Celesta Gentile, DPM

## 2015-03-02 ENCOUNTER — Emergency Department: Payer: Medicare Other

## 2015-03-02 ENCOUNTER — Encounter: Payer: Self-pay | Admitting: Emergency Medicine

## 2015-03-02 ENCOUNTER — Emergency Department
Admission: EM | Admit: 2015-03-02 | Discharge: 2015-03-02 | Disposition: A | Discharge status: Home / Self Care | Payer: Medicare Other | Attending: Emergency Medicine | Admitting: Emergency Medicine

## 2015-03-02 DIAGNOSIS — R0602 Shortness of breath: Secondary | ICD-10-CM | POA: Diagnosis present

## 2015-03-02 DIAGNOSIS — J45901 Unspecified asthma with (acute) exacerbation: Secondary | ICD-10-CM | POA: Insufficient documentation

## 2015-03-02 DIAGNOSIS — Z880 Allergy status to penicillin: Secondary | ICD-10-CM | POA: Insufficient documentation

## 2015-03-02 DIAGNOSIS — E119 Type 2 diabetes mellitus without complications: Secondary | ICD-10-CM | POA: Insufficient documentation

## 2015-03-02 DIAGNOSIS — I10 Essential (primary) hypertension: Secondary | ICD-10-CM | POA: Insufficient documentation

## 2015-03-02 DIAGNOSIS — Z87891 Personal history of nicotine dependence: Secondary | ICD-10-CM | POA: Insufficient documentation

## 2015-03-02 DIAGNOSIS — R0609 Other forms of dyspnea: Secondary | ICD-10-CM

## 2015-03-02 HISTORY — DX: Presence of aortocoronary bypass graft: Z95.1

## 2015-03-02 HISTORY — DX: Heart failure, unspecified: I50.9

## 2015-03-02 LAB — COMPREHENSIVE METABOLIC PANEL
ALBUMIN: 3.3 g/dL — AB (ref 3.5–5.0)
ALK PHOS: 96 U/L (ref 38–126)
ALT: 15 U/L — AB (ref 17–63)
ANION GAP: 7 (ref 5–15)
AST: 20 U/L (ref 15–41)
BILIRUBIN TOTAL: 0.5 mg/dL (ref 0.3–1.2)
BUN: 27 mg/dL — ABNORMAL HIGH (ref 6–20)
CALCIUM: 8.6 mg/dL — AB (ref 8.9–10.3)
CO2: 25 mmol/L (ref 22–32)
CREATININE: 1.52 mg/dL — AB (ref 0.61–1.24)
Chloride: 107 mmol/L (ref 101–111)
GFR calc Af Amer: 52 mL/min — ABNORMAL LOW (ref >60)
GFR calc non Af Amer: 45 mL/min — ABNORMAL LOW (ref >60)
GLUCOSE: 100 mg/dL — AB (ref 65–99)
Potassium: 4.8 mmol/L (ref 3.5–5.1)
Sodium: 139 mmol/L (ref 135–145)
TOTAL PROTEIN: 7 g/dL (ref 6.5–8.1)

## 2015-03-02 LAB — CBC
HEMATOCRIT: 37.5 % — AB (ref 40.0–52.0)
Hemoglobin: 11.7 g/dL — ABNORMAL LOW (ref 13.0–18.0)
MCH: 25.5 pg — ABNORMAL LOW (ref 26.0–34.0)
MCHC: 31.3 g/dL — AB (ref 32.0–36.0)
MCV: 81.4 fL (ref 80.0–100.0)
Platelets: 268 10*3/uL (ref 150–440)
RBC: 4.61 MIL/uL (ref 4.40–5.90)
RDW: 17.1 % — ABNORMAL HIGH (ref 11.5–14.5)
WBC: 9.5 10*3/uL (ref 3.8–10.6)

## 2015-03-02 LAB — TROPONIN I
Troponin I: <0.03 ng/mL (ref <0.031)
Troponin I: <0.03 ng/mL (ref <0.031)

## 2015-03-02 MED ORDER — ACETAMINOPHEN 325 MG PO TABS
650.0000 mg | ORAL_TABLET | Freq: Once | ORAL | Status: AC
  Administered 2015-03-02: 650 mg via ORAL
  Filled 2015-03-02: qty 2

## 2015-03-02 MED ORDER — IOHEXOL 350 MG/ML SOLN
75.0000 mL | Freq: Once | INTRAVENOUS | Status: AC | PRN
  Administered 2015-03-02: 75 mL via INTRAVENOUS

## 2015-03-02 NOTE — Discharge Instructions (Signed)
You were evaluated for weakness and shortness of breath with walking, and no certain source was found, however your exam and evaluation are reassuring here in the emergency department. Return to the emergency department for any worsening condition including fever, trouble breathing, shortness breath, chest pain, palpitations, dizziness, passing out, or any other symptoms concerning to you. Return for any weakness or numbness.  Called Dr. Laurelyn Sickle office and make a follow-up appointment for Monday or Tuesday. Also follow up with your primary care physician this week.   Shortness of Breath Shortness of breath means you have trouble breathing. It could also mean that you have a medical problem. You should get immediate medical care for shortness of breath. CAUSES   Not enough oxygen in the air such as with high altitudes or a smoke-filled room.  Certain lung diseases, infections, or problems.  Heart disease or conditions, such as angina or heart failure.  Low red blood cells (anemia).  Poor physical fitness, which can cause shortness of breath when you exercise.  Chest or back injuries or stiffness.  Being overweight.  Smoking.  Anxiety, which can make you feel like you are not getting enough air. DIAGNOSIS  Serious medical problems can often be found during your physical exam. Tests may also be done to determine why you are having shortness of breath. Tests may include:  Chest X-rays.  Lung function tests.  Blood tests.  An electrocardiogram (ECG).  An ambulatory electrocardiogram. An ambulatory ECG records your heartbeat patterns over a 24-hour period.  Exercise testing.  A transthoracic echocardiogram (TTE). During echocardiography, sound waves are used to evaluate how blood flows through your heart.  A transesophageal echocardiogram (TEE).  Imaging scans. Your health care provider may not be able to find a cause for your shortness of breath after your exam. In this case,  it is important to have a follow-up exam with your health care provider as directed.  TREATMENT  Treatment for shortness of breath depends on the cause of your symptoms and can vary greatly. HOME CARE INSTRUCTIONS   Do not smoke. Smoking is a common cause of shortness of breath. If you smoke, ask for help to quit.  Avoid being around chemicals or things that may bother your breathing, such as paint fumes and dust.  Rest as needed. Slowly resume your usual activities.  If medicines were prescribed, take them as directed for the full length of time directed. This includes oxygen and any inhaled medicines.  Keep all follow-up appointments as directed by your health care provider. SEEK MEDICAL CARE IF:   Your condition does not improve in the time expected.  You have a hard time doing your normal activities even with rest.  You have any new symptoms. SEEK IMMEDIATE MEDICAL CARE IF:   Your shortness of breath gets worse.  You feel light-headed, faint, or develop a cough not controlled with medicines.  You start coughing up blood.  You have pain with breathing.  You have chest pain or pain in your arms, shoulders, or abdomen.  You have a fever.  You are unable to walk up stairs or exercise the way you normally do. MAKE SURE YOU:  Understand these instructions.  Will watch your condition.  Will get help right away if you are not doing well or get worse.   This information is not intended to replace advice given to you by your health care provider. Make sure you discuss any questions you have with your health care provider.  Document Released: 02/03/2001 Document Revised: 05/16/2013 Document Reviewed: 07/27/2011 Elsevier Interactive Patient Education [employer].

## 2015-03-02 NOTE — ED Notes (Signed)
Lunch bags ordered from dietary - pt aware.

## 2015-03-02 NOTE — ED Notes (Signed)
BIB EMS from home pt reports SOB that started yesterday. Denies cold symptoms. C/o nasal congestion. Denies fever. Denies Chest pain. Reports back pain and knee pain from arthritis per pt.

## 2015-03-02 NOTE — ED Provider Notes (Signed)
[facility] Emergency Department Provider Note   ____________________________________________  Time seen: 11:45 PM I have reviewed the triage vital signs and the triage nursing note.  HISTORY  Chief Complaint Shortness of Breath   Historian Patient  HPI [name] is a 69 y.o. male who has a history of CABG and CHF, as well as asthma for which he takes albuterol and Advair, whose presenting with shortness of breath and generalized weakness while walking around his house this morning. No fever or cough. No one-sided weakness. He states that when he walks a short distance he felt very short of breath. No chest pain. No nausea. No vomiting. No sweating. Symptoms are moderate. Currently at rest patient feels well.Patient reports he takes Lasix twice daily, and his leg edema has actually improved, no new swelling.    Past Medical History  Diagnosis Date  . Hypertension   . Arthritis   . Stroke ([place])   . History of stomach ulcers   . Heart trouble   . Skin cancer   . Diabetes ([place])   . S/P CABG x 2   . CHF (congestive heart failure) ([place])     There are no active problems to display for this patient.   Past Surgical History  Procedure Laterality Date  . Kidney surgery    . Hernia repair    . Gallbladder surgery    . Cardiac surgery    . Heart bypass    . Cholecystectomy      Current Outpatient Rx  Name  Route  Sig  Dispense  Refill  . ABILIFY 10 MG tablet               . ADVAIR DISKUS 100-50 MCG/DOSE AEPB               . amLODipine (NORVASC) 10 MG tablet               . atorvastatin (LIPITOR) 40 MG tablet               . buPROPion (WELLBUTRIN SR) 100 MG 12 hr tablet               . clopidogrel (PLAVIX) 75 MG tablet               . Eszopiclone 3 MG TABS               . furosemide (LASIX) 20 MG tablet               . gabapentin (NEURONTIN) 400 MG capsule               . isosorbide mononitrate  (IMDUR) 60 MG 24 hr tablet               . levothyroxine (SYNTHROID, LEVOTHROID) 100 MCG tablet               . LORazepam (ATIVAN) 2 MG tablet               . meclizine (ANTIVERT) 25 MG tablet               . metoprolol (LOPRESSOR) 50 MG tablet               . NEXIUM 40 MG capsule               . potassium chloride SA (K-DUR,KLOR-CON) 20 MEQ tablet               .  ramipril (ALTACE) 10 MG capsule               . RANEXA 500 MG 12 hr tablet               . traMADol (ULTRAM) 50 MG tablet               . ziprasidone (GEODON) 60 MG capsule               . zolpidem (AMBIEN) 10 MG tablet                 Allergies Dilaudid; Morphine and related; Tetracyclines & related; and Penicillins  No family history on file.  Social History Social History  Substance Use Topics  . Smoking status: Former Smoker    Quit date: 03/15/1963  . Smokeless tobacco: Never Used  . Alcohol Use: No    Review of Systems  Constitutional: Negative for fever. Eyes: Negative for visual changes. ENT: Negative for sore throat. Cardiovascular: Negative for chest pain. Respiratory: Negative for cough. Gastrointestinal: Negative for abdominal pain, vomiting and diarrhea. Genitourinary: Negative for dysuria. Musculoskeletal: Negative for back pain. Skin: Negative for rash. Neurological: Negative for headache. 10 point Review of Systems otherwise negative ____________________________________________   PHYSICAL EXAM:  VITAL SIGNS: ED Triage Vitals  Enc Vitals Group     BP 03/02/15 0957 117/59 mmHg     Pulse Rate 03/02/15 0957 56     Resp 03/02/15 0957 18     Temp 03/02/15 0957 97.8 F (36.6 C)     Temp Source 03/02/15 0957 Oral     SpO2 03/02/15 1056 97 %     Weight 03/02/15 0957 330 lb (149.687 kg)     Height 03/02/15 0957 5\' 7"  (1.702 m)     Head Cir --      Peak Flow --      Pain Score --      Pain Loc --      Pain Edu? --      Excl. in  [place]? --      Constitutional: Alert and oriented. Well appearing and in no distress. Eyes: Conjunctivae are normal. PERRL. Normal extraocular movements. ENT   Head: Normocephalic and atraumatic.   Nose: No congestion/rhinnorhea.   Mouth/Throat: Mucous membranes are moist.   Neck: No stridor. Cardiovascular/Chest: Normal rate, regular rhythm.  No murmurs, rubs, or gallops. Midline sternotomy scar. Respiratory: Normal respiratory effort without tachypnea nor retractions. Breath sounds are clear and equal bilaterally. No wheezes/rales/rhonchi. Gastrointestinal: Soft. No distention, no guarding, no rebound. Nontender. Morbidly obese.  Genitourinary/rectal:Deferred Musculoskeletal: Nontender with normal range of motion in all extremities. No joint effusions.  No lower extremity tenderness.  Trace edema bilateral lower extremities. Neurologic:  Normal speech and language. No gross or focal neurologic deficits are appreciated. Skin:  Skin is warm, dry and intact. No rash noted. Psychiatric: Mood and affect are normal. Speech and behavior are normal. Patient exhibits appropriate insight and judgment.  ____________________________________________   EKG I, Lisa Roca, MD, the attending physician have personally viewed and interpreted all ECGs.  50 bpm. Sinus bradycardia with first degree AV block. Narrow QRS. Normal axis. Nonspecific ____________________________________________  LABS (pertinent positives/negatives)  White blood count 9.5, hemoglobin 11.7, platelet count 983 Metabolic panel significant for BUN 27 and creatinine 1.52 and otherwise without significant abnormalities Troponin less than 0.03 Repeat troponin less than 0.03  ____________________________________________  RADIOLOGY All Xrays were viewed by me. Imaging interpreted by Radiologist.  Chest two-view:  FINDINGS: Previous CABG. Heart size normal. Stable bibasilar linear scarring or subsegmental  atelectasis. Lungs otherwise clear. Elevation of the right diaphragmatic leaflet as before. No pneumothorax. No effusion. Minimal spurring in the mid thoracic spine.  IMPRESSION: 1. Stable postop and chronic changes. No acute disease.  Chest CT with contrast:  IMPRESSION: 1. Negative for acute PE or thoracic aortic dissection. 2. Stable chronic and postoperative changes as above. __________________________________________  PROCEDURES  Procedure(s) performed: None  Critical Care performed: None  ____________________________________________   ED COURSE / ASSESSMENT AND PLAN  CONSULTATIONS: None  Pertinent labs & imaging results that were available during my care of the patient were reviewed by me and considered in my medical decision making (see chart for details).  Patient's here for symptoms of shortness of breath with exertion without chest pain. He is not wheezing, and his O2 sat is 97% on room air. With walking around the emergency department he drops to 93-94% on room air and has minimally increased respiratory rate, without any tachycardia. patient is overall stable.  Patient's laboratory studies are reassuring with a normal white blood cell count, and negative troponin. His EKG is reassuring. He does not appear to be in acute congestive heart failure, pneumonia, bronchospasm. I discussed with him obtaining CT to rule out pulmonary embolism, we are ready to proceed with chest CT.  ----------------------------------------- 3:49 PM on 03/02/2015 ----------------------------------------- Chest CT without acute abnormalities. Patient feels well and would like to go ahead and be discharged home. I see no acute indication for hospitalization today. He does have point with his primary care physician on Tuesday already. He will call his cardiologist Dr. Ubaldo Glassing on Monday morning to make a next available appointment.  Patient / Family / Caregiver informed of clinical course, medical  decision-making process, and agree with plan.   I discussed return precautions, follow-up instructions, and discharged instructions with patient and/or family.  ___________________________________________   FINAL CLINICAL IMPRESSION(S) / ED DIAGNOSES   Final diagnoses:  Dyspnea on exertion       Lisa Roca, MD 03/02/15 1550

## 2015-04-11 ENCOUNTER — Ambulatory Visit: Payer: Medicare Other | Admitting: Podiatry

## 2015-05-02 ENCOUNTER — Encounter: Payer: Self-pay | Admitting: Podiatry

## 2015-05-02 ENCOUNTER — Ambulatory Visit (INDEPENDENT_AMBULATORY_CARE_PROVIDER_SITE_OTHER): Payer: Medicare Other | Admitting: Podiatry

## 2015-05-02 DIAGNOSIS — M79676 Pain in unspecified toe(s): Secondary | ICD-10-CM | POA: Diagnosis not present

## 2015-05-02 DIAGNOSIS — B351 Tinea unguium: Secondary | ICD-10-CM | POA: Diagnosis not present

## 2015-05-02 NOTE — Progress Notes (Signed)
Patient ID: [name], male   DOB: 12/08/1945, 68 y.o.   MRN: [id number]  Subjective: 69 y.o. returns the office today for painful, elongated, thickened toenails which he is unable to trim himself. Denies any redness or drainage around the nails. Denies any acute changes since last appointment and no new complaints today. Denies any systemic complaints such as fevers, chills, nausea, vomiting.   Objective: AAO 3, NAD DP/PT pulses palpable, CRT less than 3 seconds Nails hypertrophic, dystrophic, elongated, brittle, discolored 10. There is tenderness overlying the nails 1-5 bilaterally. There is no surrounding erythema or drainage along the nail sites. No open lesions or pre-ulcerative lesions are identified. No other areas of tenderness bilateral lower extremities. No overlying edema, erythema, increased warmth. No pain with calf compression, swelling, warmth, erythema.  Assessment: Patient presents with symptomatic onychomycosis  Plan: -Treatment options including alternatives, risks, complications were discussed -Nails sharply debrided 10 without complication/bleeding. -Discussed daily foot inspection. If there are any changes, to call the office immediately.  -Follow-up in 3 months or sooner if any problems are to arise. In the meantime, encouraged to call the office with any questions, concerns, changes symptoms.   Jkai Arwood, DPM  

## 2015-08-06 ENCOUNTER — Ambulatory Visit (INDEPENDENT_AMBULATORY_CARE_PROVIDER_SITE_OTHER): Payer: Medicare Other | Admitting: Podiatry

## 2015-08-06 ENCOUNTER — Encounter: Payer: Self-pay | Admitting: Podiatry

## 2015-08-06 DIAGNOSIS — B351 Tinea unguium: Secondary | ICD-10-CM | POA: Diagnosis not present

## 2015-08-06 DIAGNOSIS — B353 Tinea pedis: Secondary | ICD-10-CM

## 2015-08-06 DIAGNOSIS — M79676 Pain in unspecified toe(s): Secondary | ICD-10-CM

## 2015-08-06 MED ORDER — KETOCONAZOLE 2 % EX CREA: 1.0000 "application " | TOPICAL_CREAM | Freq: Every day | CUTANEOUS | Status: DC

## 2015-08-06 NOTE — Progress Notes (Signed)
Patient ID: [name], male   DOB: 07/13/1945, 70 y.o.   MRN: [id number]  Subjective: 70 y.o. returns the office today for painful, elongated, thickened toenails which he is unable to trim himself. Denies any redness or drainage around the nails. Denies any acute changes since last appointment and no new complaints today. Denies any systemic complaints such as fevers, chills, nausea, vomiting.   Objective: AAO 3, NAD DP/PT pulses palpable, CRT less than 3 seconds Nails hypertrophic, dystrophic, elongated, brittle, discolored 10. There is tenderness overlying the nails 1-5 bilaterally. There is no surrounding erythema or drainage along the nail sites. No open lesions or pre-ulcerative lesions are identified. There is dry, scaly, erythematous skin on the heel and interdigitally consistent with tinea pedis. No open sores and no drainage.  No other areas of tenderness bilateral lower extremities. No overlying edema, erythema, increased warmth. No pain with calf compression, swelling, warmth, erythema.  Assessment: Patient presents with symptomatic onychomycosis; tinea pedis.   Plan: -Treatment options including alternatives, risks, complications were discussed -Nails sharply debrided 10 without complication/bleeding. -Ketoconazle for tinea pedis.  -Discussed daily foot inspection. If there are any changes, to call the office immediately.  -Follow-up in 3 months or sooner if any problems are to arise. In the meantime, encouraged to call the office with any questions, concerns, changes symptoms.  Celesta Gentile, DPM

## 2015-10-30 ENCOUNTER — Other Ambulatory Visit: Payer: Self-pay | Admitting: Nephrology

## 2015-10-30 DIAGNOSIS — N183 Chronic kidney disease, stage 3 unspecified: Secondary | ICD-10-CM

## 2015-11-04 ENCOUNTER — Ambulatory Visit: Payer: Medicare Other

## 2015-11-05 ENCOUNTER — Ambulatory Visit
Admission: RE | Admit: 2015-11-05 | Discharge: 2015-11-05 | Disposition: A | Discharge status: Home / Self Care | Payer: Medicare Other | Source: Ambulatory Visit | Attending: Nephrology | Admitting: Nephrology

## 2015-11-05 DIAGNOSIS — N183 Chronic kidney disease, stage 3 unspecified: Secondary | ICD-10-CM

## 2015-11-12 ENCOUNTER — Encounter: Payer: Self-pay | Admitting: Podiatry

## 2015-11-12 ENCOUNTER — Ambulatory Visit (INDEPENDENT_AMBULATORY_CARE_PROVIDER_SITE_OTHER): Payer: Medicare Other | Admitting: Podiatry

## 2015-11-12 DIAGNOSIS — R234 Changes in skin texture: Secondary | ICD-10-CM

## 2015-11-12 DIAGNOSIS — B351 Tinea unguium: Secondary | ICD-10-CM

## 2015-11-12 DIAGNOSIS — M79676 Pain in unspecified toe(s): Secondary | ICD-10-CM

## 2015-11-12 NOTE — Progress Notes (Signed)
Patient ID: [name], male   DOB: 21-Dec-1945, 70 y.o.   MRN: [id number]  Subjective: 70 y.o. returns the office today for painful, elongated, thickened toenails which he is unable to trim himself. Denies any redness or drainage around the nails. He states he has a spot on the left heel which he has been applying antibiotic ointment. No drainage or pus. No swelling. Denies any acute changes since last appointment and no new complaints today. Denies any systemic complaints such as fevers, chills, nausea, vomiting.   Objective: AAO 3, NAD DP/PT pulses palpable, CRT less than 3 seconds Nails hypertrophic, dystrophic, elongated, brittle, discolored 10. There is tenderness overlying the nails 1-5 bilaterally. There is no surrounding erythema or drainage along the nail sites. No open lesions or pre-ulcerative lesions are identified. Fissure present on the plantar left heel. No drainage or pus. No edema.   No other areas of tenderness bilateral lower extremities. No overlying edema, erythema, increased warmth. No pain with calf compression, swelling, warmth, erythema.  Assessment: Patient presents with symptomatic onychomycosis; heel fissure.   Plan: -Treatment options including alternatives, risks, complications were discussed -Nails sharply debrided 10 without complication/bleeding. -Antibiotic ointment to the heel daily if bleeding. Otherwise, use a moisturizer daily.  -Discussed daily foot inspection. If there are any changes, to call the office immediately.  -Follow-up in 3 months or sooner if any problems are to arise. Follow-up in 3-4 weeks if the heel fissure is not healed or sooner if needed. In the meantime, encouraged to call the office with any questions, concerns, changes symptoms.  Celesta Gentile, DPM

## 2016-02-13 ENCOUNTER — Ambulatory Visit (INDEPENDENT_AMBULATORY_CARE_PROVIDER_SITE_OTHER): Payer: Medicare Other | Admitting: Podiatry

## 2016-02-13 ENCOUNTER — Encounter: Payer: Self-pay | Admitting: Podiatry

## 2016-02-13 DIAGNOSIS — L89629 Pressure ulcer of left heel, unspecified stage: Secondary | ICD-10-CM

## 2016-02-13 DIAGNOSIS — M79676 Pain in unspecified toe(s): Secondary | ICD-10-CM

## 2016-02-13 DIAGNOSIS — B351 Tinea unguium: Secondary | ICD-10-CM

## 2016-02-16 ENCOUNTER — Encounter: Payer: Self-pay | Admitting: Emergency Medicine

## 2016-02-16 ENCOUNTER — Observation Stay
Admission: EM | Admit: 2016-02-16 | Discharge: 2016-02-17 | Disposition: A | Discharge status: Home / Self Care | Payer: Medicare Other | Attending: Internal Medicine | Admitting: Internal Medicine

## 2016-02-16 ENCOUNTER — Emergency Department: Payer: Medicare Other

## 2016-02-16 DIAGNOSIS — R079 Chest pain, unspecified: Principal | ICD-10-CM | POA: Insufficient documentation

## 2016-02-16 DIAGNOSIS — Z23 Encounter for immunization: Secondary | ICD-10-CM | POA: Insufficient documentation

## 2016-02-16 DIAGNOSIS — I110 Hypertensive heart disease with heart failure: Secondary | ICD-10-CM | POA: Diagnosis not present

## 2016-02-16 DIAGNOSIS — Z79899 Other long term (current) drug therapy: Secondary | ICD-10-CM | POA: Insufficient documentation

## 2016-02-16 DIAGNOSIS — Z87891 Personal history of nicotine dependence: Secondary | ICD-10-CM | POA: Diagnosis not present

## 2016-02-16 DIAGNOSIS — Z885 Allergy status to narcotic agent status: Secondary | ICD-10-CM | POA: Diagnosis not present

## 2016-02-16 DIAGNOSIS — Z951 Presence of aortocoronary bypass graft: Secondary | ICD-10-CM | POA: Insufficient documentation

## 2016-02-16 DIAGNOSIS — M1990 Unspecified osteoarthritis, unspecified site: Secondary | ICD-10-CM | POA: Diagnosis not present

## 2016-02-16 DIAGNOSIS — Z7902 Long term (current) use of antithrombotics/antiplatelets: Secondary | ICD-10-CM | POA: Insufficient documentation

## 2016-02-16 DIAGNOSIS — Z7951 Long term (current) use of inhaled steroids: Secondary | ICD-10-CM | POA: Diagnosis not present

## 2016-02-16 DIAGNOSIS — E119 Type 2 diabetes mellitus without complications: Secondary | ICD-10-CM | POA: Insufficient documentation

## 2016-02-16 DIAGNOSIS — Z881 Allergy status to other antibiotic agents status: Secondary | ICD-10-CM | POA: Diagnosis not present

## 2016-02-16 DIAGNOSIS — I2510 Atherosclerotic heart disease of native coronary artery without angina pectoris: Secondary | ICD-10-CM | POA: Diagnosis not present

## 2016-02-16 DIAGNOSIS — Z8719 Personal history of other diseases of the digestive system: Secondary | ICD-10-CM | POA: Insufficient documentation

## 2016-02-16 DIAGNOSIS — Z7982 Long term (current) use of aspirin: Secondary | ICD-10-CM | POA: Insufficient documentation

## 2016-02-16 DIAGNOSIS — Z880 Allergy status to penicillin: Secondary | ICD-10-CM | POA: Diagnosis not present

## 2016-02-16 DIAGNOSIS — I509 Heart failure, unspecified: Secondary | ICD-10-CM | POA: Diagnosis not present

## 2016-02-16 DIAGNOSIS — Z8673 Personal history of transient ischemic attack (TIA), and cerebral infarction without residual deficits: Secondary | ICD-10-CM | POA: Diagnosis not present

## 2016-02-16 DIAGNOSIS — Z85828 Personal history of other malignant neoplasm of skin: Secondary | ICD-10-CM | POA: Insufficient documentation

## 2016-02-16 DIAGNOSIS — E785 Hyperlipidemia, unspecified: Secondary | ICD-10-CM | POA: Insufficient documentation

## 2016-02-16 HISTORY — DX: Chest pain, unspecified: R07.9

## 2016-02-16 LAB — BASIC METABOLIC PANEL
Anion gap: 5 (ref 5–15)
BUN: 20 mg/dL (ref 6–20)
CALCIUM: 8.6 mg/dL — AB (ref 8.9–10.3)
CO2: 28 mmol/L (ref 22–32)
Chloride: 104 mmol/L (ref 101–111)
Creatinine, Ser: 1.29 mg/dL — ABNORMAL HIGH (ref 0.61–1.24)
GFR calc Af Amer: >60 mL/min (ref >60)
GFR, EST NON AFRICAN AMERICAN: 54 mL/min — AB (ref >60)
GLUCOSE: 107 mg/dL — AB (ref 65–99)
POTASSIUM: 4.4 mmol/L (ref 3.5–5.1)
Sodium: 137 mmol/L (ref 135–145)

## 2016-02-16 LAB — PROTIME-INR
INR: 1.03
PROTHROMBIN TIME: 13.5 s (ref 11.4–15.2)

## 2016-02-16 LAB — CBC
HEMATOCRIT: 42 % (ref 40.0–52.0)
Hemoglobin: 13.5 g/dL (ref 13.0–18.0)
MCH: 27.9 pg (ref 26.0–34.0)
MCHC: 32.2 g/dL (ref 32.0–36.0)
MCV: 86.9 fL (ref 80.0–100.0)
Platelets: 236 10*3/uL (ref 150–440)
RBC: 4.83 MIL/uL (ref 4.40–5.90)
RDW: 16.5 % — AB (ref 11.5–14.5)
WBC: 8.2 10*3/uL (ref 3.8–10.6)

## 2016-02-16 LAB — TROPONIN I
Troponin I: <0.03 ng/mL (ref <0.03)
Troponin I: <0.03 ng/mL (ref <0.03)

## 2016-02-16 LAB — GLUCOSE, CAPILLARY: GLUCOSE-CAPILLARY: 126 mg/dL — AB (ref 65–99)

## 2016-02-16 MED ORDER — ATORVASTATIN CALCIUM 20 MG PO TABS
40.0000 mg | ORAL_TABLET | Freq: Every day | ORAL | Status: DC
  Administered 2016-02-17: 40 mg via ORAL
  Filled 2016-02-16: qty 2

## 2016-02-16 MED ORDER — FUROSEMIDE 20 MG PO TABS
20.0000 mg | ORAL_TABLET | Freq: Two times a day (BID) | ORAL | Status: DC
  Administered 2016-02-17: 20 mg via ORAL
  Filled 2016-02-16: qty 1

## 2016-02-16 MED ORDER — GI COCKTAIL ~~LOC~~: 30.0000 mL | Freq: Four times a day (QID) | ORAL | Status: DC | PRN

## 2016-02-16 MED ORDER — ISOSORBIDE MONONITRATE ER 60 MG PO TB24
60.0000 mg | ORAL_TABLET | Freq: Every day | ORAL | Status: DC
  Administered 2016-02-17: 60 mg via ORAL
  Filled 2016-02-16: qty 1

## 2016-02-16 MED ORDER — ALPRAZOLAM 0.25 MG PO TABS: 0.2500 mg | ORAL_TABLET | Freq: Two times a day (BID) | ORAL | Status: DC | PRN

## 2016-02-16 MED ORDER — ZOLPIDEM TARTRATE 5 MG PO TABS
5.0000 mg | ORAL_TABLET | Freq: Every evening | ORAL | Status: DC | PRN
  Administered 2016-02-16: 5 mg via ORAL
  Filled 2016-02-16: qty 1

## 2016-02-16 MED ORDER — INSULIN ASPART 100 UNIT/ML ~~LOC~~ SOLN: 0.0000 [IU] | Freq: Every day | SUBCUTANEOUS | Status: DC

## 2016-02-16 MED ORDER — METOPROLOL TARTRATE 50 MG PO TABS
50.0000 mg | ORAL_TABLET | Freq: Every day | ORAL | Status: DC
  Administered 2016-02-17: 50 mg via ORAL
  Filled 2016-02-16: qty 1

## 2016-02-16 MED ORDER — SODIUM CHLORIDE 0.9 % IV SOLN
INTRAVENOUS | Status: DC
  Administered 2016-02-16: 21:00:00 via INTRAVENOUS

## 2016-02-16 MED ORDER — TRAMADOL HCL 50 MG PO TABS
100.0000 mg | ORAL_TABLET | Freq: Four times a day (QID) | ORAL | Status: DC
  Administered 2016-02-16: 100 mg via ORAL
  Filled 2016-02-16: qty 2

## 2016-02-16 MED ORDER — ACETAMINOPHEN 325 MG PO TABS
650.0000 mg | ORAL_TABLET | ORAL | Status: DC | PRN
  Administered 2016-02-17: 650 mg via ORAL
  Filled 2016-02-16: qty 2

## 2016-02-16 MED ORDER — INSULIN ASPART 100 UNIT/ML ~~LOC~~ SOLN: 6.0000 [IU] | Freq: Three times a day (TID) | SUBCUTANEOUS | Status: DC

## 2016-02-16 MED ORDER — MONTELUKAST SODIUM 10 MG PO TABS
10.0000 mg | ORAL_TABLET | Freq: Every day | ORAL | Status: DC
  Administered 2016-02-16: 10 mg via ORAL
  Filled 2016-02-16: qty 1

## 2016-02-16 MED ORDER — BUPROPION HCL ER (SR) 100 MG PO TB12
100.0000 mg | ORAL_TABLET | Freq: Every day | ORAL | Status: DC
  Administered 2016-02-17: 100 mg via ORAL
  Filled 2016-02-16: qty 1

## 2016-02-16 MED ORDER — CLOPIDOGREL BISULFATE 75 MG PO TABS
75.0000 mg | ORAL_TABLET | Freq: Once | ORAL | Status: AC
  Administered 2016-02-17: 75 mg via ORAL
  Filled 2016-02-16: qty 1

## 2016-02-16 MED ORDER — INSULIN ASPART 100 UNIT/ML ~~LOC~~ SOLN: 0.0000 [IU] | Freq: Three times a day (TID) | SUBCUTANEOUS | Status: DC

## 2016-02-16 MED ORDER — ASPIRIN EC 81 MG PO TBEC
81.0000 mg | DELAYED_RELEASE_TABLET | Freq: Every day | ORAL | Status: DC
  Administered 2016-02-17: 81 mg via ORAL
  Filled 2016-02-16: qty 1

## 2016-02-16 MED ORDER — ZIPRASIDONE HCL 20 MG PO CAPS
60.0000 mg | ORAL_CAPSULE | Freq: Two times a day (BID) | ORAL | Status: DC
Start: 2016-02-17 — End: 2016-02-17
  Administered 2016-02-17: 60 mg via ORAL
  Filled 2016-02-16: qty 3

## 2016-02-16 MED ORDER — PANTOPRAZOLE SODIUM 40 MG PO TBEC
40.0000 mg | DELAYED_RELEASE_TABLET | Freq: Every day | ORAL | Status: DC
  Administered 2016-02-17: 40 mg via ORAL
  Filled 2016-02-16: qty 1

## 2016-02-16 MED ORDER — ARIPIPRAZOLE 5 MG PO TABS
5.0000 mg | ORAL_TABLET | Freq: Every day | ORAL | Status: DC
Start: 2016-02-17 — End: 2016-02-17
  Administered 2016-02-17: 5 mg via ORAL
  Filled 2016-02-16: qty 1

## 2016-02-16 MED ORDER — ENOXAPARIN SODIUM 40 MG/0.4ML ~~LOC~~ SOLN
40.0000 mg | SUBCUTANEOUS | Status: DC
  Administered 2016-02-16: 40 mg via SUBCUTANEOUS
  Filled 2016-02-16: qty 0.4

## 2016-02-16 MED ORDER — GABAPENTIN 400 MG PO CAPS
400.0000 mg | ORAL_CAPSULE | Freq: Three times a day (TID) | ORAL | Status: DC
  Administered 2016-02-16 – 2016-02-17 (×2): 400 mg via ORAL
  Filled 2016-02-16 (×2): qty 1

## 2016-02-16 MED ORDER — RANOLAZINE ER 500 MG PO TB12
500.0000 mg | ORAL_TABLET | Freq: Two times a day (BID) | ORAL | Status: DC
  Administered 2016-02-16 – 2016-02-17 (×2): 500 mg via ORAL
  Filled 2016-02-16 (×2): qty 1

## 2016-02-16 MED ORDER — NITROGLYCERIN 0.4 MG SL SUBL: 0.4000 mg | SUBLINGUAL_TABLET | SUBLINGUAL | Status: DC | PRN

## 2016-02-16 MED ORDER — SUVOREXANT 10 MG PO TABS: 1.0000 | ORAL_TABLET | Freq: Every day | ORAL | Status: DC

## 2016-02-16 MED ORDER — AMLODIPINE BESYLATE 10 MG PO TABS
10.0000 mg | ORAL_TABLET | Freq: Every day | ORAL | Status: DC
Start: 2016-02-17 — End: 2016-02-17
  Administered 2016-02-17: 10 mg via ORAL
  Filled 2016-02-16: qty 1

## 2016-02-16 MED ORDER — RAMIPRIL 10 MG PO CAPS
10.0000 mg | ORAL_CAPSULE | Freq: Every day | ORAL | Status: DC
Start: 2016-02-17 — End: 2016-02-17
  Administered 2016-02-17: 10 mg via ORAL
  Filled 2016-02-16: qty 1

## 2016-02-16 MED ORDER — MECLIZINE HCL 25 MG PO TABS
25.0000 mg | ORAL_TABLET | Freq: Two times a day (BID) | ORAL | Status: DC | PRN
  Filled 2016-02-16: qty 1

## 2016-02-16 MED ORDER — LORAZEPAM 1 MG PO TABS
2.0000 mg | ORAL_TABLET | Freq: Every day | ORAL | Status: DC
  Administered 2016-02-16: 2 mg via ORAL
  Filled 2016-02-16: qty 2

## 2016-02-16 MED ORDER — INFLUENZA VAC SPLIT QUAD 0.5 ML IM SUSY
0.5000 mL | PREFILLED_SYRINGE | INTRAMUSCULAR | Status: AC
  Administered 2016-02-17: 0.5 mL via INTRAMUSCULAR
  Filled 2016-02-16: qty 0.5

## 2016-02-16 MED ORDER — LEVOTHYROXINE SODIUM 100 MCG PO TABS
100.0000 ug | ORAL_TABLET | Freq: Every day | ORAL | Status: DC
  Administered 2016-02-17: 100 ug via ORAL
  Filled 2016-02-16: qty 1

## 2016-02-16 MED ORDER — MOMETASONE FURO-FORMOTEROL FUM 100-5 MCG/ACT IN AERO
2.0000 | INHALATION_SPRAY | Freq: Two times a day (BID) | RESPIRATORY_TRACT | Status: DC
  Administered 2016-02-16 – 2016-02-17 (×2): 2 via RESPIRATORY_TRACT
  Filled 2016-02-16: qty 8.8

## 2016-02-16 MED ORDER — ONDANSETRON HCL 4 MG/2ML IJ SOLN
4.0000 mg | Freq: Four times a day (QID) | INTRAMUSCULAR | Status: DC | PRN
  Administered 2016-02-17: 4 mg via INTRAVENOUS
  Filled 2016-02-16: qty 2

## 2016-02-16 NOTE — ED Provider Notes (Signed)
[facility] Emergency Department Provider Note  ____________________________________________   I have reviewed the triage vital signs and the nursing notes.   HISTORY  Chief Complaint Chest Pain   History limited by: Not Limited   HPI [name] is a 70 y.o. male presents to the emergency department today because of concerns for chest pain. The patient states the pain is located in the left upper chest. It started roughly 4 hours ago. It started when he was lying down. It started in his arm and moved to his chest. He now states that the pain as starting in the back moving through his chest. It was accompanied by some shortness of breath. He denies any diaphoresis. The patient was given 4 baby aspirin by EMS and states that he feels like this has helped his pain. He now rates his discomfort a 3 out of 10. Does have a history of multiple bypass surgeries.    Past Medical History:  Diagnosis Date  . Arthritis   . CHF (congestive heart failure) ([place])   . Diabetes ([place])   . Heart trouble   . History of stomach ulcers   . Hypertension   . S/P CABG x 2   . Skin cancer   . Stroke [facility])     There are no active problems to display for this patient.   Past Surgical History:  Procedure Laterality Date  . CARDIAC SURGERY    . CHOLECYSTECTOMY    . GALLBLADDER SURGERY    . HEART BYPASS    . HERNIA REPAIR    . KIDNEY SURGERY      Prior to Admission medications   Medication Sig Start Date End Date Taking? Authorizing Provider  ABILIFY 10 MG tablet  05/31/13   Historical Provider, MD  ADVAIR DISKUS 100-50 MCG/DOSE AEPB  05/31/13   Historical Provider, MD  amLODipine (NORVASC) 10 MG tablet  05/31/13   Historical Provider, MD  atorvastatin (LIPITOR) 40 MG tablet  05/31/13   Historical Provider, MD  buPROPion (WELLBUTRIN SR) 100 MG 12 hr tablet  05/31/13   Historical Provider, MD  clopidogrel (PLAVIX) 75 MG tablet  05/31/13   Historical Provider, MD  Eszopiclone 3 MG  TABS  05/30/13   Historical Provider, MD  furosemide (LASIX) 20 MG tablet  05/31/13   Historical Provider, MD  gabapentin (NEURONTIN) 400 MG capsule  05/31/13   Historical Provider, MD  isosorbide mononitrate (IMDUR) 60 MG 24 hr tablet  05/31/13   Historical Provider, MD  ketoconazole (NIZORAL) 2 % cream Apply 1 application topically daily. 08/06/15   Trula Slade, DPM  levothyroxine (SYNTHROID, LEVOTHROID) 100 MCG tablet  05/31/13   Historical Provider, MD  LORazepam (ATIVAN) 2 MG tablet  05/31/13   Historical Provider, MD  meclizine (ANTIVERT) 25 MG tablet  05/31/13   Historical Provider, MD  metoprolol (LOPRESSOR) 50 MG tablet  05/31/13   Historical Provider, MD  NEXIUM 40 MG capsule  05/31/13   Historical Provider, MD  potassium chloride SA (K-DUR,KLOR-CON) 20 MEQ tablet  05/31/13   Historical Provider, MD  ramipril (ALTACE) 10 MG capsule  05/31/13   Historical Provider, MD  RANEXA 500 MG 12 hr tablet  05/31/13   Historical Provider, MD  traMADol Veatrice Bourbon) 50 MG tablet  06/06/13   Historical Provider, MD  ziprasidone (GEODON) 60 MG capsule  05/31/13   Historical Provider, MD  zolpidem (AMBIEN) 10 MG tablet  05/31/13   Historical Provider, MD    Allergies Dilaudid [hydromorphone  hcl]; Morphine and related; Tetracyclines & related; and Penicillins  History reviewed. No pertinent family history.  Social History Social History  Substance Use Topics  . Smoking status: Former Smoker    Quit date: 03/15/1963  . Smokeless tobacco: Never Used  . Alcohol use No    Review of Systems  Constitutional: Negative for fever. Cardiovascular: Positive for chest pain. Respiratory: Positive for shortness of breath. Gastrointestinal: Negative for abdominal pain, vomiting and diarrhea. Neurological: Negative for headaches, focal weakness or numbness.   10-point ROS otherwise negative.  ____________________________________________   PHYSICAL EXAM:  VITAL SIGNS: ED Triage Vitals  Enc Vitals Group     BP 02/16/16  1541 (!) 145/58     Pulse Rate 02/16/16 1541 (!) 59     Resp 02/16/16 1541 19     Temp 02/16/16 1541 99 F (37.2 C)     Temp Source 02/16/16 1541 Oral     SpO2 02/16/16 1541 97 %     Weight 02/16/16 1542 (!) 360 lb (163.3 kg)     Height 02/16/16 1542 5\' 8"  (1.727 m)     Head Circumference --      Peak Flow --      Pain Score 02/16/16 1542 5   Constitutional: Alert and oriented. Well appearing and in no distress. Eyes: Conjunctivae are normal. Normal extraocular movements. ENT   Head: Normocephalic and atraumatic.   Nose: No congestion/rhinnorhea.   Mouth/Throat: Mucous membranes are moist.   Neck: No stridor. Hematological/Lymphatic/Immunilogical: No cervical lymphadenopathy. Cardiovascular: Normal rate, regular rhythm.  No murmurs, rubs, or gallops. Surgical scar present over sternum.  Respiratory: Normal respiratory effort without tachypnea nor retractions. Breath sounds are clear and equal bilaterally. No wheezes/rales/rhonchi. Gastrointestinal: Soft and nontender. No distention.  Genitourinary: Deferred Musculoskeletal: Normal range of motion in all extremities. No lower extremity edema. Neurologic:  Normal speech and language. No gross focal neurologic deficits are appreciated.  Skin:  Skin is warm, dry and intact. No rash noted. Psychiatric: Mood and affect are normal. Speech and behavior are normal. Patient exhibits appropriate insight and judgment.  ____________________________________________    LABS (pertinent positives/negatives)  Labs Reviewed  BASIC METABOLIC PANEL - Abnormal; Notable for the following:       Result Value   Glucose, Bld 107 (*)    Creatinine, Ser 1.29 (*)    Calcium 8.6 (*)    GFR calc non Af Amer 54 (*)    All other components within normal limits  CBC - Abnormal; Notable for the following:    RDW 16.5 (*)    All other components within normal limits  TROPONIN I  PROTIME-INR      ____________________________________________   EKG  I, Nance Pear, attending physician, personally viewed and interpreted this EKG  EKG Time: 1541 Rate: 60 Rhythm: normal sinus rhythm with 1st degree av block Axis: left axis deviation Intervals: qtc 449, 1st degree av block QRS: narrow ST changes: no st elevation Impression: abnormal ekg   ____________________________________________    RADIOLOGY  CXR IMPRESSION:  Stable elevation of right hemidiaphragm. No edema or consolidation.  Stable cardiac silhouette.      ____________________________________________   PROCEDURES  Procedures  ____________________________________________   INITIAL IMPRESSION / ASSESSMENT AND PLAN / ED COURSE  Pertinent labs & imaging results that were available during my care of the patient were reviewed by me and considered in my medical decision making (see chart for details).  Patient presented to the emergency department today with episode of chest pain  and shortness of breath that occurred while at rest. She does have a history of multiple bypass surgery. Patient is high risk for chest pain. Initial troponin negative. EKG without STEMI. Will have her plan on admission to the hospital service for further workup and management. ____________________________________________   FINAL CLINICAL IMPRESSION(S) / ED DIAGNOSES  Final diagnoses:  Chest pain, unspecified chest pain type     Note: This dictation was prepared with Dragon dictation. Any transcriptional errors that result from this process are unintentional    Nance Pear, MD 02/16/16 1824

## 2016-02-16 NOTE — H&P (Addendum)
[place] @ [facility] Admission History and Physical Harvie Bridge, D.O.  ---------------------------------------------------------------------------------------------------------------------   PATIENT NAME: [name] MR#: [id number] DATE OF BIRTH: 1945-11-11 DATE OF ADMISSION: 02/16/2016 PRIMARY CARE PHYSICIAN: Volanda Napoleon, MD  REQUESTING/REFERRING PHYSICIAN: ED Dr. Archie Balboa  CHIEF COMPLAINT: Chief Complaint  Patient presents with  . Chest Pain    HISTORY OF PRESENT ILLNESS: [name] is a 70 y.o. male with a known history of Hypertension, CHF, diet controlled diabetes, CAD status post CABG, CVA was in a usual state of health until this afternoon when he began to experience left sided chest pain which occurred at rest and radiated to his left arm and back. He states that he has not strained. He has not had any like this in the past. He reports associated shortness of breath but denies nausea, vomiting, diapho nausea, vomiting,.   Otherwise there has been no change in status. Patient has been taking medication as prescribed and there has been no recent change in medication or diet.  There has been no recent illness, travel or sick contacts. Patient lives at home alone and is functionally independent although he does have some home health aide to come and assist him during the week.   Patient denies fevers/chills, weakness, dizziness, N/V/C/D, abdominal pain, dysuria/frequency, changes in mental status.    PAST MEDICAL HISTORY: Past Medical History:  Diagnosis Date  . Arthritis   . CHF (congestive heart failure) ([place])   . Diabetes ([place])   . Heart trouble   . History of stomach ulcers   . Hypertension   . S/P CABG x 2   . Skin cancer   . Stroke [facility])       PAST SURGICAL HISTORY: Past Surgical History:  Procedure Laterality Date  . CARDIAC SURGERY    . CHOLECYSTECTOMY    . GALLBLADDER SURGERY    . HEART BYPASS    . HERNIA REPAIR    .  KIDNEY SURGERY        SOCIAL HISTORY: Social History  Substance Use Topics  . Smoking status: Former Smoker    Quit date: 03/15/1963  . Smokeless tobacco: Never Used  . Alcohol use No      FAMILY HISTORY: History reviewed. No pertinent family history.   MEDICATIONS AT HOME: Prior to Admission medications   Medication Sig Start Date End Date Taking? Authorizing Provider  ADVAIR DISKUS 100-50 MCG/DOSE AEPB Inhale 1 puff into the lungs 2 (two) times daily.  05/31/13  Yes Historical Provider, MD  amLODipine (NORVASC) 10 MG tablet Take 10 mg by mouth daily.  05/31/13  Yes Historical Provider, MD  ARIPiprazole (ABILIFY) 5 MG tablet Take 5 mg by mouth daily.  05/31/13  Yes Historical Provider, MD  aspirin EC 81 MG tablet Take 81 mg by mouth daily.   Yes Historical Provider, MD  atorvastatin (LIPITOR) 40 MG tablet Take 40 mg by mouth daily.  05/31/13  Yes Historical Provider, MD  buPROPion (WELLBUTRIN SR) 100 MG 12 hr tablet Take 100 mg by mouth daily.  05/31/13  Yes Historical Provider, MD  clopidogrel (PLAVIX) 75 MG tablet Take 75 mg by mouth once.  05/31/13  Yes Historical Provider, MD  furosemide (LASIX) 20 MG tablet Take 20 mg by mouth 2 (two) times daily.  05/31/13  Yes Historical Provider, MD  gabapentin (NEURONTIN) 400 MG capsule Take 400 mg by mouth 3 (three) times daily.  05/31/13  Yes Historical Provider, MD  isosorbide mononitrate (IMDUR) 60 MG 24 hr  tablet Take 60 mg by mouth daily.  05/31/13  Yes Historical Provider, MD  ketoconazole (NIZORAL) 2 % cream Apply 1 application topically daily. 08/06/15  Yes Trula Slade, DPM  levothyroxine (SYNTHROID, LEVOTHROID) 100 MCG tablet Take 100 mcg by mouth daily before breakfast.  05/31/13  Yes Historical Provider, MD  LORazepam (ATIVAN) 2 MG tablet Take 2 mg by mouth at bedtime.  05/31/13  Yes Historical Provider, MD  meclizine (ANTIVERT) 25 MG tablet Take 25 mg by mouth 2 (two) times daily as needed.  05/31/13  Yes Historical Provider, MD  metoprolol  (LOPRESSOR) 50 MG tablet Take 50 mg by mouth daily.  05/31/13  Yes Historical Provider, MD  montelukast (SINGULAIR) 10 MG tablet Take 1 tablet by mouth at bedtime.    Yes Historical Provider, MD  pantoprazole (PROTONIX) 40 MG tablet Take 1 tablet by mouth daily.   Yes Historical Provider, MD  ramipril (ALTACE) 10 MG capsule Take 10 mg by mouth daily.  05/31/13  Yes Historical Provider, MD  ranolazine (RANEXA) 500 MG 12 hr tablet Take 500 mg by mouth 2 (two) times daily.  05/31/13  Yes Historical Provider, MD  Suvorexant (BELSOMRA) 10 MG TABS Take 1 tablet by mouth at bedtime.   Yes Historical Provider, MD  traMADol (ULTRAM) 50 MG tablet Take 100 mg by mouth 4 (four) times daily.  06/06/13  Yes Historical Provider, MD  ziprasidone (GEODON) 60 MG capsule Take 60 mg by mouth 2 (two) times daily with a meal.  05/31/13  Yes Historical Provider, MD      DRUG ALLERGIES: Allergies  Allergen Reactions  . Dilaudid [Hydromorphone Hcl] Other (See Comments)    CAN NOT HEAR, CAN NOT THINK  . Morphine And Related   . Tetracyclines & Related   . Penicillins Rash     REVIEW OF SYSTEMS: CONSTITUTIONAL: No fever/chills, fatigue, weakness, weight gain/loss, headache EYES: No blurry or double vision. ENT: No tinnitus, postnasal drip, redness or soreness of the oropharynx. RESPIRATORY: No cough, wheeze, hemoptysis, dyspnea. CARDIOVASCULAR: Positive chest pain,  negative orthopnea, palpitations, syncope. GASTROINTESTINAL: No nausea, vomiting, constipation, diarrhea, abdominal pain, hematemesis, melena or hematochezia. GENITOURINARY: No dysuria or hematuria. ENDOCRINE: No polyuria or nocturia. No heat or cold intolerance. HEMATOLOGY: No anemia, bruising, bleeding. INTEGUMENTARY: No rashes, ulcers, lesions. MUSCULOSKELETAL: No arthritis, swelling, gout. NEUROLOGIC: No numbness, tingling, weakness or ataxia. No seizure-type activity. PSYCHIATRIC: No anxiety, depression, insomnia.  PHYSICAL EXAMINATION: VITAL  SIGNS: Blood pressure (!) 145/63, pulse (!) 59, temperature 98.2 F (36.8 C), temperature source Oral, resp. rate 15, height 5\' 8"  (1.727 m), weight (!) 163.3 kg (360 lb), SpO2 98 %.  GENERAL: 70 y.o.-year-old white malepatient, well-developed, well-nourished lying in the bed in no acute distress.  Pleasant and cooperative.   HEENT: Head atraumatic, normocephalic. Pupils equal, round, reactive to light and accommodation. No scleral icterus. Extraocular muscles intact. Nares are patent. Oropharynx is clear. Mucus membranes moist. NECK: Supple, full range of motion. No JVD, no bruit heard. No thyroid enlargement, no tenderness, no cervical lymphadenopathy. CHEST: Normal breath sounds bilaterally. No wheezing, rales, rhonchi or crackles. No use of accessory muscles of respiration.  No reproducible chest wall tenderness.  he does have a large well-healed midline scar over the sternum  CARDIOVASCULAR: S1, S2 normal. No murmurs, rubs, or gallops. Cap refill <2 seconds. ABDOMEN: Soft, nontender, nondistended. No rebound, guarding, rigidity. Normoactive bowel sounds present in all four quadrants. No organomegaly or mass. EXTREMITIES: Full range of motion. No pedal edema, cyanosis, or clubbing.  NEUROLOGIC: Cranial nerves II through XII are grossly intact with no focal sensorimotor deficit. Muscle strength 5/5 in all extremities. Sensation intact. Gait not checked. PSYCHIATRIC: The patient is alert and oriented x 3. Normal affect, mood, thought content. SKIN: Warm, dry, and intact without obvious rash, lesion, or ulcer.  LABORATORY PANEL:  CBC  Recent Labs Lab 02/16/16 1551  WBC 8.2  HGB 13.5  HCT 42.0  PLT 236   ----------------------------------------------------------------------------------------------------------------- Chemistries  Recent Labs Lab 02/16/16 1551  NA 137  K 4.4  CL 104  CO2 28  GLUCOSE 107*  BUN 20  CREATININE 1.29*  CALCIUM 8.6*    ------------------------------------------------------------------------------------------------------------------ Cardiac Enzymes  Recent Labs Lab 02/16/16 1551  TROPONINI <0.03   ------------------------------------------------------------------------------------------------------------------  RADIOLOGY: Dg Chest 2 View  Result Date: 02/16/2016 CLINICAL DATA:  Chest pain and shortness of breath EXAM: CHEST  2 VIEW COMPARISON:  Chest radiograph March 02, 2015 and chest CT March 02, 2015 FINDINGS: There is stable elevation of the right hemidiaphragm. There is mild scarring in the left base. There is no edema or consolidation. Heart is upper normal in size with pulmonary vascularity within normal limits. Patient is status post coronary artery bypass grafting. Several sternal wires are fractured, stable. No pneumothorax. There is degenerative change in each shoulder. IMPRESSION: Stable elevation of right hemidiaphragm. No edema or consolidation. Stable cardiac silhouette. Electronically Signed   By: Lowella Grip III M.D.   On: 02/16/2016 16:25    EKG:  sinus at 60 bpm with a left axis deviation, first-degree AV block and nonspecific ST and T wave changes.  IMPRESSION AND PLAN:  This is a 70 y.o. male with a history of Hypertension, CHF, diet controlled diabetes, CAD status post CABG, CVA now being admitted with: 1.  Left-sided chest pain, rule out ACS-we'll admit for telemetry, observation, trend troponins and cardiology consultation. 2. History of coronary artery disease-continue aspirin, Plavix, indoor, Ranexa,  3. History of hyperlipidemia-continue Lipitor 4. History of hypertension-continue Norvasc, Lopressor, Altase 5. History of CHF-continue Lasix 6. History of seasonal allergies-continue Singulair and Advair 7. History of peripheral neuropathy-continue Neurontin 8. History of diabetes-heart healthy, carb controlled diet with Accu-Cheks and regular insulin sliding scale  coverage 9. History of bipolar disorder-continue Wellbutrin, Ativan, Geodon 10. History of obstructive sleep apnea-continue CPAP. 11. Elevated creatinine-improved from prior.  Diet/Nutrition: Healthy, carb controlled, nothing by mouth after midnight FluidHep-Lock Lovenox, SCDs and early ambulation Code Status: Full  All the records are reviewed and case discussed with ED provider. Management plans discussed with the patient and/or family who express understanding and agree with plan of care.   TOTAL TIME TAKING CARE OF THIS PATIENT: 60 minutes.   Maaliyah Adolph D.O. on 02/16/2016 at 7:55 PM Between 7am to 6pm - Pager - [phone] After 6pm go to [email or web address] - [employer] [facility] [place] [facility] Office [phone] CC: Primary care physician; Volanda Napoleon, MD     Note: This dictation was prepared with Dragon dictation along with smaller phrase technology. Any transcriptional errors that result from this process are unintentional.

## 2016-02-16 NOTE — ED Triage Notes (Signed)
CP started 1.5 hrs ago; started out at 5/10, went to 3/10; VSS per EMS 143/74, 61, 178, 98%, 109 BG Got 324 mg of ASA @ 1520 Endorses SOB with exertion

## 2016-02-16 NOTE — Care Management Obs Status (Signed)
[facility] NOTIFICATION   Patient Details  Name: [name] MRN: [id number] Date of Birth: 1945/08/10   Medicare Observation Status Notification Given:  Yes (chest pain)    Ival Bible, RN 02/16/2016, 6:58 PM

## 2016-02-17 DIAGNOSIS — R079 Chest pain, unspecified: Secondary | ICD-10-CM | POA: Diagnosis not present

## 2016-02-17 LAB — LIPID PANEL
CHOLESTEROL: 121 mg/dL (ref 0–200)
HDL: 28 mg/dL — ABNORMAL LOW (ref >40)
LDL Cholesterol: 65 mg/dL (ref 0–99)
TRIGLYCERIDES: 139 mg/dL (ref <150)
Total CHOL/HDL Ratio: 4.3 RATIO
VLDL: 28 mg/dL (ref 0–40)

## 2016-02-17 LAB — GLUCOSE, CAPILLARY: Glucose-Capillary: 100 mg/dL — ABNORMAL HIGH (ref 65–99)

## 2016-02-17 LAB — TROPONIN I: Troponin I: <0.03 ng/mL (ref <0.03)

## 2016-02-17 MED ORDER — TRAMADOL HCL 50 MG PO TABS
100.0000 mg | ORAL_TABLET | ORAL | Status: DC | PRN
  Administered 2016-02-17 (×2): 100 mg via ORAL
  Filled 2016-02-17 (×2): qty 2

## 2016-02-17 NOTE — Progress Notes (Signed)
Patient ID: [name], male   DOB: 09-21-1945, 70 y.o.   MRN: [id number]  Subjective: 70 y.o. returns the office today for painful, elongated, thickened toenails which he is unable to trim himself. Denies any redness or drainage around the nails. He states he has a spot on the left heel which he has been applying antibiotic ointment but this has been getting painful but the heel does hurt with pressure. He does not walk much and his heels touch the mattress constantly. No drainage or pus. No swelling. Denies any acute changes since last appointment and no new complaints today. Denies any systemic complaints such as fevers, chills, nausea, vomiting.   Objective: AAO 3, NAD DP/PT pulses palpable, CRT less than 3 seconds Nails hypertrophic, dystrophic, elongated, brittle, discolored 10. There is tenderness overlying the nails 1-5 bilaterally. There is no surrounding erythema or drainage along the nail sites. No open lesions or pre-ulcerative lesions are identified. Fissure has healed on the plantar left heel. There is minimal erythema to the posterior aspect of the left heel which is blanchable of this is likely due to a pre-ulcerative lesion.   No other areas of tenderness bilateral lower extremities. No overlying edema, erythema, increased warmth. No pain with calf compression, swelling, warmth, erythema.  Assessment: Patient presents with symptomatic onychomycosis; left heel pressure, pre-ulcerative lesion  Plan: -Treatment options including alternatives, risks, complications were discussed -Nails sharply debrided 10 without complication/bleeding. -Offloading boots were dispensed to the patient's height left heel. Discussed with him to float the heels do not touch the bed as well. Monitoring skin breakdown. -Discussed daily foot inspection. If there are any changes, to call the office immediately.  -Follow-up in 3 months or sooner if any problems are to arise. In the meantime, encouraged  to call the office with any questions, concerns, changes symptoms.  Celesta Gentile, DPM

## 2016-02-17 NOTE — Progress Notes (Signed)
Pt to be discharged today. Iv and tele removed. disch instructions given to pt to his understanding. Paper scrubs provided to wear home. disch via w.c. Accompanied by friend.

## 2016-02-17 NOTE — H&P (Signed)
[name] is a 70 y.o. male  [id number]  Primary Cardiologist: Neoma Laming Reason for Consultation: Chest pain  HPI: This is a 70 year old white male with a past medical history of hypertension hyperlipidemia CHF and CABG presented to the hospital with chest pain lasting for an hour associated with shortness of breath and diaphoresis. A shouldn't no longer has any chest pain or shortness of breath feeling much better.   Review of Systems: No orthopnea PND or leg swelling   Past Medical History:  Diagnosis Date  . Arthritis   . CHF (congestive heart failure) ([place])   . Diabetes ([place])   . Heart trouble   . History of stomach ulcers   . Hypertension   . S/P CABG x 2   . Skin cancer   . Stroke [facility])     Medications Prior to Admission  Medication Sig Dispense Refill  . ADVAIR DISKUS 100-50 MCG/DOSE AEPB Inhale 1 puff into the lungs 2 (two) times daily.     [name] amLODipine (NORVASC) 10 MG tablet Take 10 mg by mouth daily.     . ARIPiprazole (ABILIFY) 5 MG tablet Take 5 mg by mouth daily.     [name] aspirin EC 81 MG tablet Take 81 mg by mouth daily.    [name] atorvastatin (LIPITOR) 40 MG tablet Take 40 mg by mouth daily.     [name] buPROPion (WELLBUTRIN SR) 100 MG 12 hr tablet Take 100 mg by mouth daily.     . clopidogrel (PLAVIX) 75 MG tablet Take 75 mg by mouth once.     . furosemide (LASIX) 20 MG tablet Take 20 mg by mouth 2 (two) times daily.     [name] gabapentin (NEURONTIN) 400 MG capsule Take 400 mg by mouth 3 (three) times daily.     . isosorbide mononitrate (IMDUR) 60 MG 24 hr tablet Take 60 mg by mouth daily.     [name] ketoconazole (NIZORAL) 2 % cream Apply 1 application topically daily. 60 g 2  . levothyroxine (SYNTHROID, LEVOTHROID) 100 MCG tablet Take 100 mcg by mouth daily before breakfast.     . LORazepam (ATIVAN) 2 MG tablet Take 2 mg by mouth at bedtime.     . meclizine (ANTIVERT) 25 MG tablet Take 25 mg by mouth 2 (two) times daily as needed.     . metoprolol (LOPRESSOR) 50 MG  tablet Take 50 mg by mouth daily.     . montelukast (SINGULAIR) 10 MG tablet Take 1 tablet by mouth at bedtime.     . pantoprazole (PROTONIX) 40 MG tablet Take 1 tablet by mouth daily.    . ramipril (ALTACE) 10 MG capsule Take 10 mg by mouth daily.     . ranolazine (RANEXA) 500 MG 12 hr tablet Take 500 mg by mouth 2 (two) times daily.     . Suvorexant (BELSOMRA) 10 MG TABS Take 1 tablet by mouth at bedtime.    . traMADol (ULTRAM) 50 MG tablet Take 100 mg by mouth 4 (four) times daily.     . ziprasidone (GEODON) 60 MG capsule Take 60 mg by mouth 2 (two) times daily with a meal.        . amLODipine  10 mg Oral Daily  . ARIPiprazole  5 mg Oral Daily  . aspirin EC  81 mg Oral Daily  . atorvastatin  40 mg Oral Daily  . buPROPion  100 mg Oral Daily  . clopidogrel  75 mg Oral Once  . enoxaparin (  LOVENOX) injection  40 mg Subcutaneous Q24H  . furosemide  20 mg Oral BID  . gabapentin  400 mg Oral TID  . Influenza vac split quadrivalent PF  0.5 mL Intramuscular Tomorrow-1000  . insulin aspart  0-20 Units Subcutaneous TID WC  . insulin aspart  0-5 Units Subcutaneous QHS  . insulin aspart  6 Units Subcutaneous TID WC  . isosorbide mononitrate  60 mg Oral Daily  . levothyroxine  100 mcg Oral QAC breakfast  . LORazepam  2 mg Oral QHS  . metoprolol  50 mg Oral Daily  . mometasone-formoterol  2 puff Inhalation BID  . montelukast  10 mg Oral QHS  . pantoprazole  40 mg Oral Daily  . ramipril  10 mg Oral Daily  . ranolazine  500 mg Oral BID  . ziprasidone  60 mg Oral BID WC    Infusions: . sodium chloride 75 mL/hr at 02/16/16 2110    Allergies  Allergen Reactions  . Dilaudid [Hydromorphone Hcl] Other (See Comments)    CAN NOT HEAR, CAN NOT THINK  . Morphine And Related   . Tetracyclines & Related   . Penicillins Rash    Social History   Social History  . Marital status: Divorced    Spouse name: N/A  . Number of children: N/A  . Years of education: N/A   Occupational History  .  Not on file.   Social History Main Topics  . Smoking status: Former Smoker    Quit date: 03/15/1963  . Smokeless tobacco: Never Used  . Alcohol use No  . Drug use: No  . Sexual activity: Not on file   Other Topics Concern  . Not on file   Social History Narrative  . No narrative on file    History reviewed. No pertinent family history.  PHYSICAL EXAM: Vitals:   02/17/16 0600 02/17/16 0800  BP: 137/74 (!) 120/51  Pulse: 60 (!) 59  Resp:  18  Temp: 98.2 F (36.8 C) 98.6 F (37 C)     Intake/Output Summary (Last 24 hours) at 02/17/16 0835 Last data filed at 02/17/16 0749  Gross per 24 hour  Intake            602.5 ml  Output             1500 ml  Net           -897.5 ml    General:  Well appearing. No respiratory difficulty HEENT: normal Neck: supple. no JVD. Carotids 2+ bilat; no bruits. No lymphadenopathy or thryomegaly appreciated. Cor: PMI nondisplaced. Regular rate & rhythm. No rubs, gallops or murmurs. Lungs: clear Abdomen: soft, nontender, nondistended. No hepatosplenomegaly. No bruits or masses. Good bowel sounds. Extremities: no cyanosis, clubbing, rash, edema Neuro: alert & oriented x 3, cranial nerves grossly intact. moves all 4 extremities w/o difficulty. Affect pleasant.  ECG: Sinus rhythm with first-degree AV block right axis deviation nonspecific ST-T changes  Results for orders placed or performed during the hospital encounter of 02/16/16 (from the past 24 hour(s))  Basic metabolic panel     Status: Abnormal   Collection Time: 02/16/16  3:51 PM  Result Value Ref Range   Sodium 137 135 - 145 mmol/L   Potassium 4.4 3.5 - 5.1 mmol/L   Chloride 104 101 - 111 mmol/L   CO2 28 22 - 32 mmol/L   Glucose, Bld 107 (H) 65 - 99 mg/dL   BUN 20 6 - 20 mg/dL   Creatinine, Ser  1.29 (H) 0.61 - 1.24 mg/dL   Calcium 8.6 (L) 8.9 - 10.3 mg/dL   GFR calc non Af Amer 54 (L) >60 mL/min   GFR calc Af Amer >60 >60 mL/min   Anion gap 5 5 - 15  CBC     Status: Abnormal    Collection Time: 02/16/16  3:51 PM  Result Value Ref Range   WBC 8.2 3.8 - 10.6 K/uL   RBC 4.83 4.40 - 5.90 MIL/uL   Hemoglobin 13.5 13.0 - 18.0 g/dL   HCT 42.0 40.0 - 52.0 %   MCV 86.9 80.0 - 100.0 fL   MCH 27.9 26.0 - 34.0 pg   MCHC 32.2 32.0 - 36.0 g/dL   RDW 16.5 (H) 11.5 - 14.5 %   Platelets 236 150 - 440 K/uL  Troponin I     Status: None   Collection Time: 02/16/16  3:51 PM  Result Value Ref Range   Troponin I <0.03 <0.03 ng/mL  Protime-INR (order if Patient is taking Coumadin / Warfarin)     Status: None   Collection Time: 02/16/16  3:51 PM  Result Value Ref Range   Prothrombin Time 13.5 11.4 - 15.2 seconds   INR 1.03   Troponin I-serum (0, 3, 6 hours)     Status: None   Collection Time: 02/16/16  8:58 PM  Result Value Ref Range   Troponin I <0.03 <0.03 ng/mL  Glucose, capillary     Status: Abnormal   Collection Time: 02/16/16  9:10 PM  Result Value Ref Range   Glucose-Capillary 126 (H) 65 - 99 mg/dL   Comment 1 Notify RN   Troponin I-serum (0, 3, 6 hours)     Status: None   Collection Time: 02/16/16 11:04 PM  Result Value Ref Range   Troponin I <0.03 <0.03 ng/mL  Troponin I-serum (0, 3, 6 hours)     Status: None   Collection Time: 02/17/16  2:15 AM  Result Value Ref Range   Troponin I <0.03 <0.03 ng/mL  Lipid panel     Status: Abnormal   Collection Time: 02/17/16  2:15 AM  Result Value Ref Range   Cholesterol 121 0 - 200 mg/dL   Triglycerides 139 <150 mg/dL   HDL 28 (L) >40 mg/dL   Total CHOL/HDL Ratio 4.3 RATIO   VLDL 28 0 - 40 mg/dL   LDL Cholesterol 65 0 - 99 mg/dL  Glucose, capillary     Status: Abnormal   Collection Time: 02/17/16  7:32 AM  Result Value Ref Range   Glucose-Capillary 100 (H) 65 - 99 mg/dL   Dg Chest 2 View  Result Date: 02/16/2016 CLINICAL DATA:  Chest pain and shortness of breath EXAM: CHEST  2 VIEW COMPARISON:  Chest radiograph March 02, 2015 and chest CT March 02, 2015 FINDINGS: There is stable elevation of the right  hemidiaphragm. There is mild scarring in the left base. There is no edema or consolidation. Heart is upper normal in size with pulmonary vascularity within normal limits. Patient is status post coronary artery bypass grafting. Several sternal wires are fractured, stable. No pneumothorax. There is degenerative change in each shoulder. IMPRESSION: Stable elevation of right hemidiaphragm. No edema or consolidation. Stable cardiac silhouette. Electronically Signed   By: Lowella Grip III M.D.   On: 02/16/2016 16:25     ASSESSMENT AND PLAN: Atypical chest pain with myocardial infarction ruled out patient can go home with follow-up in the office about 10:00 tomorrow morning and will set up  outpatient stress test. Patient is no longer having chest pain and continue the same medication patient went on.  Tekelia Kareem A

## 2016-02-17 NOTE — Progress Notes (Signed)
Pt requesting pain medication, only has tylenol available,pt wanting tramadol. Pt states takes tramadol q4hrs at home as needed. Spoke with Dr. Marcille Blanco, will change orders. So pt can have pain medication now. Will continue to monitor. Conley Simmonds, RN

## 2016-02-17 NOTE — Discharge Summary (Signed)
[facility] NAME: [name]    MR#:  [id number]  DATE OF BIRTH:  1946/01/17  DATE OF ADMISSION:  02/16/2016 ADMITTING PHYSICIAN: Harvie Bridge, DO  DATE OF DISCHARGE: 02/17/16  PRIMARY CARE PHYSICIAN: Volanda Napoleon, MD    ADMISSION DIAGNOSIS:  Chest pain, unspecified chest pain type [R07.9]  DISCHARGE DIAGNOSIS:  Active Problems:   Chest pain, rule out acute myocardial infarction   SECONDARY DIAGNOSIS:   Past Medical History:  Diagnosis Date  . Arthritis   . CHF (congestive heart failure) ([place])   . Diabetes ([place])   . Heart trouble   . History of stomach ulcers   . Hypertension   . S/P CABG x 2   . Skin cancer   . Stroke [facility])     HOSPITAL COURSE:  [name]  is a 70 y.o. male admitted 02/16/2016 with chief complaint Chest Pain . Please see H&P performed by Harvie Bridge, DO for further information. Patient presented with the above symptoms. Evaluated by cardiology - chest pain free, stable for outpatient followup  DISCHARGE CONDITIONS:   stable  CONSULTS OBTAINED:  Treatment Team:  Dionisio David, MD  DRUG ALLERGIES:   Allergies  Allergen Reactions  . Dilaudid [Hydromorphone Hcl] Other (See Comments)    CAN NOT HEAR, CAN NOT THINK  . Morphine And Related   . Tetracyclines & Related   . Penicillins Rash    DISCHARGE MEDICATIONS:   Current Discharge Medication List    CONTINUE these medications which have NOT CHANGED   Details  ADVAIR DISKUS 100-50 MCG/DOSE AEPB Inhale 1 puff into the lungs 2 (two) times daily.     amLODipine (NORVASC) 10 MG tablet Take 10 mg by mouth daily.     ARIPiprazole (ABILIFY) 5 MG tablet Take 5 mg by mouth daily.     aspirin EC 81 MG tablet Take 81 mg by mouth daily.    atorvastatin (LIPITOR) 40 MG tablet Take 40 mg by mouth daily.     buPROPion (WELLBUTRIN SR) 100 MG 12 hr tablet Take 100 mg by mouth daily.     clopidogrel (PLAVIX) 75 MG  tablet Take 75 mg by mouth once.     furosemide (LASIX) 20 MG tablet Take 20 mg by mouth 2 (two) times daily.     gabapentin (NEURONTIN) 400 MG capsule Take 400 mg by mouth 3 (three) times daily.     isosorbide mononitrate (IMDUR) 60 MG 24 hr tablet Take 60 mg by mouth daily.     ketoconazole (NIZORAL) 2 % cream Apply 1 application topically daily. Qty: 60 g, Refills: 2    levothyroxine (SYNTHROID, LEVOTHROID) 100 MCG tablet Take 100 mcg by mouth daily before breakfast.     LORazepam (ATIVAN) 2 MG tablet Take 2 mg by mouth at bedtime.     meclizine (ANTIVERT) 25 MG tablet Take 25 mg by mouth 2 (two) times daily as needed.     metoprolol (LOPRESSOR) 50 MG tablet Take 50 mg by mouth daily.     montelukast (SINGULAIR) 10 MG tablet Take 1 tablet by mouth at bedtime.     pantoprazole (PROTONIX) 40 MG tablet Take 1 tablet by mouth daily.    ramipril (ALTACE) 10 MG capsule Take 10 mg by mouth daily.     ranolazine (RANEXA) 500 MG 12 hr tablet Take 500 mg by mouth 2 (two) times daily.     Suvorexant (BELSOMRA) 10 MG TABS Take 1  tablet by mouth at bedtime.    traMADol (ULTRAM) 50 MG tablet Take 100 mg by mouth 4 (four) times daily.     ziprasidone (GEODON) 60 MG capsule Take 60 mg by mouth 2 (two) times daily with a meal.          DISCHARGE INSTRUCTIONS:    DIET:  Cardiac diet  DISCHARGE CONDITION:  Good  ACTIVITY:  Activity as tolerated  OXYGEN:  Home Oxygen: No.   Oxygen Delivery: room air  DISCHARGE LOCATION:  home   If you experience worsening of your admission symptoms, develop shortness of breath, life threatening emergency, suicidal or homicidal thoughts you must seek medical attention immediately by calling 911 or calling your MD immediately  if symptoms less severe.  You Must read complete instructions/literature along with all the possible adverse reactions/side effects for all the Medicines you take and that have been prescribed to you. Take any new  Medicines after you have completely understood and accpet all the possible adverse reactions/side effects.   Please note  You were cared for by a hospitalist during your hospital stay. If you have any questions about your discharge medications or the care you received while you were in the hospital after you are discharged, you can call the unit and asked to speak with the hospitalist on call if the hospitalist that took care of you is not available. Once you are discharged, your primary care physician will handle any further medical issues. Please note that NO REFILLS for any discharge medications will be authorized once you are discharged, as it is imperative that you return to your primary care physician (or establish a relationship with a primary care physician if you do not have one) for your aftercare needs so that they can reassess your need for medications and monitor your lab values.    On the day of Discharge:   VITAL SIGNS:  Blood pressure (!) 120/51, pulse (!) 59, temperature 98.6 F (37 C), temperature source Oral, resp. rate 18, height 5\' 7"  (1.702 m), weight (!) 160.7 kg (354 lb 4.8 oz), SpO2 96 %.  I/O:   Intake/Output Summary (Last 24 hours) at 02/17/16 1016 Last data filed at 02/17/16 0749  Gross per 24 hour  Intake            602.5 ml  Output             1500 ml  Net           -897.5 ml    PHYSICAL EXAMINATION:  GENERAL:  70 y.o.-year-old patient lying in the bed with no acute distress.  EYES: Pupils equal, round, reactive to light and accommodation. No scleral icterus. Extraocular muscles intact.  HEENT: Head atraumatic, normocephalic. Oropharynx and nasopharynx clear.  NECK:  Supple, no jugular venous distention. No thyroid enlargement, no tenderness.  LUNGS: Normal breath sounds bilaterally, no wheezing, rales,rhonchi or crepitation. No use of accessory muscles of respiration.  CARDIOVASCULAR: S1, S2 normal. No murmurs, rubs, or gallops.  ABDOMEN: Soft,  non-tender, non-distended. Bowel sounds present. No organomegaly or mass.  EXTREMITIES: No pedal edema, cyanosis, or clubbing.  NEUROLOGIC: Cranial nerves II through XII are intact. Muscle strength 5/5 in all extremities. Sensation intact. Gait not checked.  PSYCHIATRIC: The patient is alert and oriented x 3.  SKIN: No obvious rash, lesion, or ulcer.   DATA REVIEW:   CBC  Recent Labs Lab 02/16/16 1551  WBC 8.2  HGB 13.5  HCT 42.0  PLT 236  Chemistries   Recent Labs Lab 02/16/16 1551  NA 137  K 4.4  CL 104  CO2 28  GLUCOSE 107*  BUN 20  CREATININE 1.29*  CALCIUM 8.6*    Cardiac Enzymes  Recent Labs Lab 02/17/16 0215  TROPONINI <0.03    Microbiology Results  Results for orders placed or performed during the hospital encounter of 08/20/14  Culture, blood (single)     Status: None   Collection Time: 08/20/14 10:56 AM  Result Value Ref Range Status   Micro Text Report   Final       COMMENT                   NO GROWTH AEROBICALLY/ANAEROBICALLY IN 5 DAYS   ANTIBIOTIC                                                      Culture, blood (single)     Status: None   Collection Time: 08/20/14 11:16 AM  Result Value Ref Range Status   Micro Text Report   Final       COMMENT                   NO GROWTH AEROBICALLY/ANAEROBICALLY IN 5 DAYS   ANTIBIOTIC                                                      Urine culture     Status: None   Collection Time: 08/20/14 11:16 AM  Result Value Ref Range Status   Micro Text Report   Final       SOURCE: CC    ORGANISM 1                >100,000 CFU/ML Escherichia coli   ANTIBIOTIC                    ORG#1     AMPICILLIN                    R         CEFAZOLIN                     I         CEFOXITIN                     R         CEFTRIAXONE                   S         CIPROFLOXACIN                 R         ERTAPENEM                     S         GENTAMICIN                    S         IMIPENEM  S         LEVOFLOXACIN                  R         NITROFURANTOIN                S         Trimethoprim/Sulfamethoxazole S             RADIOLOGY:  Dg Chest 2 View  Result Date: 02/16/2016 CLINICAL DATA:  Chest pain and shortness of breath EXAM: CHEST  2 VIEW COMPARISON:  Chest radiograph March 02, 2015 and chest CT March 02, 2015 FINDINGS: There is stable elevation of the right hemidiaphragm. There is mild scarring in the left base. There is no edema or consolidation. Heart is upper normal in size with pulmonary vascularity within normal limits. Patient is status post coronary artery bypass grafting. Several sternal wires are fractured, stable. No pneumothorax. There is degenerative change in each shoulder. IMPRESSION: Stable elevation of right hemidiaphragm. No edema or consolidation. Stable cardiac silhouette. Electronically Signed   By: Lowella Grip III M.D.   On: 02/16/2016 16:25     Management plans discussed with the patient, family and they are in agreement.  CODE STATUS:     Code Status Orders        Start     Ordered   02/16/16 2034  Full code  Continuous     02/16/16 2033    Code Status History    Date Active Date Inactive Code Status Order ID Comments User Context   This patient has a current code status but no historical code status.    Advance Directive Documentation   [place] Most Recent Value  Type of Advance Directive  Living will  Pre-existing out of facility DNR order (yellow form or pink MOST form)  No data  "MOST" Form in Place?  No data      TOTAL TIME TAKING CARE OF THIS PATIENT: 33 minutes.    Hower,  Karenann Cai.D on 02/17/2016 at 10:16 AM  Between 7am to 6pm - Pager - [phone]  After 6pm go to [email or web address] - Proofreader  [facility]  Office  [phone]  CC: Primary care physician; Volanda Napoleon, MD

## 2016-02-18 LAB — THYROID PANEL WITH TSH
FREE THYROXINE INDEX: 2.5 (ref 1.2–4.9)
T3 UPTAKE RATIO: 26 % (ref 24–39)
T4 TOTAL: 9.5 ug/dL (ref 4.5–12.0)
TSH: 2.8 u[IU]/mL (ref 0.450–4.500)

## 2016-04-26 ENCOUNTER — Emergency Department: Payer: Medicare Other

## 2016-04-26 ENCOUNTER — Inpatient Hospital Stay
Admission: EM | Admit: 2016-04-26 | Discharge: 2016-04-28 | DRG: 690 | Disposition: A | Discharge status: Home Health | Payer: Medicare Other | Attending: Internal Medicine | Admitting: Internal Medicine

## 2016-04-26 ENCOUNTER — Encounter: Payer: Self-pay | Admitting: Emergency Medicine

## 2016-04-26 DIAGNOSIS — M6281 Muscle weakness (generalized): Secondary | ICD-10-CM

## 2016-04-26 DIAGNOSIS — E872 Acidosis, unspecified: Secondary | ICD-10-CM

## 2016-04-26 DIAGNOSIS — Z8673 Personal history of transient ischemic attack (TIA), and cerebral infarction without residual deficits: Secondary | ICD-10-CM

## 2016-04-26 DIAGNOSIS — E119 Type 2 diabetes mellitus without complications: Secondary | ICD-10-CM

## 2016-04-26 DIAGNOSIS — Y92009 Unspecified place in unspecified non-institutional (private) residence as the place of occurrence of the external cause: Secondary | ICD-10-CM

## 2016-04-26 DIAGNOSIS — N183 Chronic kidney disease, stage 3 unspecified: Secondary | ICD-10-CM | POA: Diagnosis present

## 2016-04-26 DIAGNOSIS — Z7951 Long term (current) use of inhaled steroids: Secondary | ICD-10-CM

## 2016-04-26 DIAGNOSIS — Z85828 Personal history of other malignant neoplasm of skin: Secondary | ICD-10-CM

## 2016-04-26 DIAGNOSIS — I5032 Chronic diastolic (congestive) heart failure: Secondary | ICD-10-CM

## 2016-04-26 DIAGNOSIS — R778 Other specified abnormalities of plasma proteins: Secondary | ICD-10-CM | POA: Diagnosis present

## 2016-04-26 DIAGNOSIS — N3000 Acute cystitis without hematuria: Secondary | ICD-10-CM | POA: Diagnosis not present

## 2016-04-26 DIAGNOSIS — I2510 Atherosclerotic heart disease of native coronary artery without angina pectoris: Secondary | ICD-10-CM | POA: Diagnosis present

## 2016-04-26 DIAGNOSIS — I130 Hypertensive heart and chronic kidney disease with heart failure and stage 1 through stage 4 chronic kidney disease, or unspecified chronic kidney disease: Secondary | ICD-10-CM | POA: Diagnosis present

## 2016-04-26 DIAGNOSIS — I509 Heart failure, unspecified: Secondary | ICD-10-CM | POA: Diagnosis present

## 2016-04-26 DIAGNOSIS — R5081 Fever presenting with conditions classified elsewhere: Secondary | ICD-10-CM

## 2016-04-26 DIAGNOSIS — Z951 Presence of aortocoronary bypass graft: Secondary | ICD-10-CM

## 2016-04-26 DIAGNOSIS — Z7982 Long term (current) use of aspirin: Secondary | ICD-10-CM

## 2016-04-26 DIAGNOSIS — Z885 Allergy status to narcotic agent status: Secondary | ICD-10-CM

## 2016-04-26 DIAGNOSIS — R262 Difficulty in walking, not elsewhere classified: Secondary | ICD-10-CM

## 2016-04-26 DIAGNOSIS — I248 Other forms of acute ischemic heart disease: Secondary | ICD-10-CM | POA: Diagnosis present

## 2016-04-26 DIAGNOSIS — Z79899 Other long term (current) drug therapy: Secondary | ICD-10-CM

## 2016-04-26 DIAGNOSIS — R531 Weakness: Secondary | ICD-10-CM

## 2016-04-26 DIAGNOSIS — Z6841 Body Mass Index (BMI) 40.0 and over, adult: Secondary | ICD-10-CM

## 2016-04-26 DIAGNOSIS — Z880 Allergy status to penicillin: Secondary | ICD-10-CM

## 2016-04-26 DIAGNOSIS — N390 Urinary tract infection, site not specified: Secondary | ICD-10-CM | POA: Diagnosis present

## 2016-04-26 DIAGNOSIS — E1122 Type 2 diabetes mellitus with diabetic chronic kidney disease: Secondary | ICD-10-CM | POA: Diagnosis present

## 2016-04-26 DIAGNOSIS — W19XXXA Unspecified fall, initial encounter: Secondary | ICD-10-CM | POA: Diagnosis present

## 2016-04-26 DIAGNOSIS — R7989 Other specified abnormal findings of blood chemistry: Secondary | ICD-10-CM

## 2016-04-26 DIAGNOSIS — I10 Essential (primary) hypertension: Secondary | ICD-10-CM | POA: Diagnosis present

## 2016-04-26 DIAGNOSIS — Z87891 Personal history of nicotine dependence: Secondary | ICD-10-CM

## 2016-04-26 DIAGNOSIS — Z7902 Long term (current) use of antithrombotics/antiplatelets: Secondary | ICD-10-CM

## 2016-04-26 LAB — URINALYSIS COMPLETE WITH MICROSCOPIC (ARMC ONLY)
BILIRUBIN URINE: NEGATIVE
Glucose, UA: 150 mg/dL — AB
Nitrite: POSITIVE — AB
PH: 5 (ref 5.0–8.0)
PROTEIN: 30 mg/dL — AB
Specific Gravity, Urine: 1.015 (ref 1.005–1.030)

## 2016-04-26 LAB — COMPREHENSIVE METABOLIC PANEL
ALBUMIN: 3 g/dL — AB (ref 3.5–5.0)
ALT: 28 U/L (ref 17–63)
AST: 44 U/L — AB (ref 15–41)
Alkaline Phosphatase: 114 U/L (ref 38–126)
Anion gap: 9 (ref 5–15)
BUN: 22 mg/dL — AB (ref 6–20)
CHLORIDE: 106 mmol/L (ref 101–111)
CO2: 22 mmol/L (ref 22–32)
Calcium: 8.8 mg/dL — ABNORMAL LOW (ref 8.9–10.3)
Creatinine, Ser: 1.53 mg/dL — ABNORMAL HIGH (ref 0.61–1.24)
GFR calc Af Amer: 51 mL/min — ABNORMAL LOW (ref >60)
GFR calc non Af Amer: 44 mL/min — ABNORMAL LOW (ref >60)
GLUCOSE: 169 mg/dL — AB (ref 65–99)
POTASSIUM: 3.6 mmol/L (ref 3.5–5.1)
Sodium: 137 mmol/L (ref 135–145)
Total Bilirubin: 0.5 mg/dL (ref 0.3–1.2)
Total Protein: 7 g/dL (ref 6.5–8.1)

## 2016-04-26 LAB — CBC WITH DIFFERENTIAL/PLATELET
Basophils Absolute: 0 10*3/uL (ref 0–0.1)
Basophils Relative: 0 %
Eosinophils Absolute: 0 10*3/uL (ref 0–0.7)
Eosinophils Relative: 1 %
HEMATOCRIT: 40.2 % (ref 40.0–52.0)
Hemoglobin: 13.2 g/dL (ref 13.0–18.0)
LYMPHS PCT: 2 %
Lymphs Abs: 0.1 10*3/uL — ABNORMAL LOW (ref 1.0–3.6)
MCH: 27.7 pg (ref 26.0–34.0)
MCHC: 32.9 g/dL (ref 32.0–36.0)
MCV: 84.1 fL (ref 80.0–100.0)
MONO ABS: 0.1 10*3/uL — AB (ref 0.2–1.0)
MONOS PCT: 1 %
NEUTROS ABS: 7.8 10*3/uL — AB (ref 1.4–6.5)
Neutrophils Relative %: 96 %
Platelets: 173 10*3/uL (ref 150–440)
RBC: 4.78 MIL/uL (ref 4.40–5.90)
RDW: 16.9 % — AB (ref 11.5–14.5)
WBC: 8.1 10*3/uL (ref 3.8–10.6)

## 2016-04-26 LAB — LIPASE, BLOOD: Lipase: 19 U/L (ref 11–51)

## 2016-04-26 MED ORDER — LEVOFLOXACIN IN D5W 750 MG/150ML IV SOLN
750.0000 mg | Freq: Once | INTRAVENOUS | Status: AC
  Administered 2016-04-27: 750 mg via INTRAVENOUS
  Filled 2016-04-26: qty 150

## 2016-04-26 MED ORDER — ACETAMINOPHEN 500 MG PO TABS
1000.0000 mg | ORAL_TABLET | Freq: Once | ORAL | Status: AC
  Administered 2016-04-26: 1000 mg via ORAL
  Filled 2016-04-26: qty 2

## 2016-04-26 MED ORDER — SODIUM CHLORIDE 0.9 % IV BOLUS (SEPSIS)
500.0000 mL | Freq: Once | INTRAVENOUS | Status: AC
  Administered 2016-04-27: 500 mL via INTRAVENOUS

## 2016-04-26 NOTE — ED Triage Notes (Signed)
Pt coming from home (living by self) via EMS for generalized weakness and for fall no LOC and did not hit head. Pt had fever of 101.9.

## 2016-04-27 ENCOUNTER — Encounter: Payer: Self-pay | Admitting: Internal Medicine

## 2016-04-27 ENCOUNTER — Inpatient Hospital Stay
Admit: 2016-04-27 | Discharge: 2016-04-27 | Disposition: A | Discharge status: Home / Self Care | Payer: Medicare Other | Attending: Internal Medicine | Admitting: Internal Medicine

## 2016-04-27 DIAGNOSIS — Z7951 Long term (current) use of inhaled steroids: Secondary | ICD-10-CM | POA: Diagnosis not present

## 2016-04-27 DIAGNOSIS — E872 Acidosis: Secondary | ICD-10-CM | POA: Diagnosis present

## 2016-04-27 DIAGNOSIS — Z7982 Long term (current) use of aspirin: Secondary | ICD-10-CM | POA: Diagnosis not present

## 2016-04-27 DIAGNOSIS — I248 Other forms of acute ischemic heart disease: Secondary | ICD-10-CM | POA: Diagnosis present

## 2016-04-27 DIAGNOSIS — Z79899 Other long term (current) drug therapy: Secondary | ICD-10-CM | POA: Diagnosis not present

## 2016-04-27 DIAGNOSIS — N183 Chronic kidney disease, stage 3 unspecified: Secondary | ICD-10-CM | POA: Diagnosis present

## 2016-04-27 DIAGNOSIS — N390 Urinary tract infection, site not specified: Secondary | ICD-10-CM | POA: Diagnosis present

## 2016-04-27 DIAGNOSIS — Z7902 Long term (current) use of antithrombotics/antiplatelets: Secondary | ICD-10-CM | POA: Diagnosis not present

## 2016-04-27 DIAGNOSIS — I2510 Atherosclerotic heart disease of native coronary artery without angina pectoris: Secondary | ICD-10-CM

## 2016-04-27 DIAGNOSIS — I509 Heart failure, unspecified: Secondary | ICD-10-CM | POA: Diagnosis present

## 2016-04-27 DIAGNOSIS — E1122 Type 2 diabetes mellitus with diabetic chronic kidney disease: Secondary | ICD-10-CM | POA: Diagnosis present

## 2016-04-27 DIAGNOSIS — W19XXXA Unspecified fall, initial encounter: Secondary | ICD-10-CM | POA: Diagnosis present

## 2016-04-27 DIAGNOSIS — Z880 Allergy status to penicillin: Secondary | ICD-10-CM | POA: Diagnosis not present

## 2016-04-27 DIAGNOSIS — Z951 Presence of aortocoronary bypass graft: Secondary | ICD-10-CM | POA: Diagnosis not present

## 2016-04-27 DIAGNOSIS — I10 Essential (primary) hypertension: Secondary | ICD-10-CM | POA: Diagnosis present

## 2016-04-27 DIAGNOSIS — Z885 Allergy status to narcotic agent status: Secondary | ICD-10-CM | POA: Diagnosis not present

## 2016-04-27 DIAGNOSIS — Z6841 Body Mass Index (BMI) 40.0 and over, adult: Secondary | ICD-10-CM | POA: Diagnosis not present

## 2016-04-27 DIAGNOSIS — Z87891 Personal history of nicotine dependence: Secondary | ICD-10-CM | POA: Diagnosis not present

## 2016-04-27 DIAGNOSIS — N3000 Acute cystitis without hematuria: Secondary | ICD-10-CM | POA: Diagnosis present

## 2016-04-27 DIAGNOSIS — I130 Hypertensive heart and chronic kidney disease with heart failure and stage 1 through stage 4 chronic kidney disease, or unspecified chronic kidney disease: Secondary | ICD-10-CM | POA: Diagnosis present

## 2016-04-27 DIAGNOSIS — Y92009 Unspecified place in unspecified non-institutional (private) residence as the place of occurrence of the external cause: Secondary | ICD-10-CM | POA: Diagnosis not present

## 2016-04-27 DIAGNOSIS — Z8673 Personal history of transient ischemic attack (TIA), and cerebral infarction without residual deficits: Secondary | ICD-10-CM | POA: Diagnosis not present

## 2016-04-27 DIAGNOSIS — R778 Other specified abnormalities of plasma proteins: Secondary | ICD-10-CM | POA: Diagnosis present

## 2016-04-27 DIAGNOSIS — Z85828 Personal history of other malignant neoplasm of skin: Secondary | ICD-10-CM | POA: Diagnosis not present

## 2016-04-27 DIAGNOSIS — I5032 Chronic diastolic (congestive) heart failure: Secondary | ICD-10-CM

## 2016-04-27 DIAGNOSIS — E119 Type 2 diabetes mellitus without complications: Secondary | ICD-10-CM

## 2016-04-27 HISTORY — DX: Atherosclerotic heart disease of native coronary artery without angina pectoris: I25.10

## 2016-04-27 HISTORY — DX: Essential (primary) hypertension: I10

## 2016-04-27 LAB — CBC
HEMATOCRIT: 35.9 % — AB (ref 40.0–52.0)
HEMOGLOBIN: 11.8 g/dL — AB (ref 13.0–18.0)
MCH: 27.5 pg (ref 26.0–34.0)
MCHC: 32.8 g/dL (ref 32.0–36.0)
MCV: 83.9 fL (ref 80.0–100.0)
Platelets: 170 10*3/uL (ref 150–440)
RBC: 4.28 MIL/uL — ABNORMAL LOW (ref 4.40–5.90)
RDW: 16.8 % — AB (ref 11.5–14.5)
WBC: 13.7 10*3/uL — ABNORMAL HIGH (ref 3.8–10.6)

## 2016-04-27 LAB — TROPONIN I
TROPONIN I: 0.1 ng/mL — AB (ref <0.03)
TROPONIN I: 0.1 ng/mL — AB (ref <0.03)
Troponin I: 0.25 ng/mL (ref <0.03)
Troponin I: 0.21 ng/mL (ref <0.03)

## 2016-04-27 LAB — GLUCOSE, CAPILLARY
GLUCOSE-CAPILLARY: 165 mg/dL — AB (ref 65–99)
GLUCOSE-CAPILLARY: 133 mg/dL — AB (ref 65–99)
Glucose-Capillary: 161 mg/dL — ABNORMAL HIGH (ref 65–99)
Glucose-Capillary: 168 mg/dL — ABNORMAL HIGH (ref 65–99)

## 2016-04-27 LAB — BASIC METABOLIC PANEL
ANION GAP: 7 (ref 5–15)
BUN: 24 mg/dL — AB (ref 6–20)
CO2: 23 mmol/L (ref 22–32)
Calcium: 8.2 mg/dL — ABNORMAL LOW (ref 8.9–10.3)
Chloride: 105 mmol/L (ref 101–111)
Creatinine, Ser: 1.69 mg/dL — ABNORMAL HIGH (ref 0.61–1.24)
GFR calc Af Amer: 45 mL/min — ABNORMAL LOW (ref >60)
GFR calc non Af Amer: 39 mL/min — ABNORMAL LOW (ref >60)
GLUCOSE: 215 mg/dL — AB (ref 65–99)
POTASSIUM: 4.1 mmol/L (ref 3.5–5.1)
Sodium: 135 mmol/L (ref 135–145)

## 2016-04-27 LAB — BRAIN NATRIURETIC PEPTIDE: B NATRIURETIC PEPTIDE 5: 210 pg/mL — AB (ref 0.0–100.0)

## 2016-04-27 LAB — LACTIC ACID, PLASMA
LACTIC ACID, VENOUS: 1.7 mmol/L (ref 0.5–1.9)
LACTIC ACID, VENOUS: 1.9 mmol/L (ref 0.5–1.9)
Lactic Acid, Venous: 2.7 mmol/L (ref 0.5–1.9)

## 2016-04-27 MED ORDER — ATORVASTATIN CALCIUM 20 MG PO TABS
80.0000 mg | ORAL_TABLET | Freq: Every evening | ORAL | Status: DC
  Administered 2016-04-27: 80 mg via ORAL
  Filled 2016-04-27: qty 4

## 2016-04-27 MED ORDER — BUPROPION HCL ER (SR) 100 MG PO TB12
100.0000 mg | ORAL_TABLET | Freq: Every day | ORAL | Status: DC
Start: 2016-04-27 — End: 2016-04-28
  Administered 2016-04-27 – 2016-04-28 (×2): 100 mg via ORAL
  Filled 2016-04-27 (×3): qty 1

## 2016-04-27 MED ORDER — ASPIRIN EC 81 MG PO TBEC
81.0000 mg | DELAYED_RELEASE_TABLET | Freq: Every day | ORAL | Status: DC
Start: 2016-04-27 — End: 2016-04-28
  Administered 2016-04-27 – 2016-04-28 (×2): 81 mg via ORAL
  Filled 2016-04-27 (×2): qty 1

## 2016-04-27 MED ORDER — DIPHENHYDRAMINE HCL 25 MG PO CAPS
25.0000 mg | ORAL_CAPSULE | Freq: Every evening | ORAL | Status: DC | PRN
  Administered 2016-04-27: 25 mg via ORAL
  Filled 2016-04-27: qty 1

## 2016-04-27 MED ORDER — ENOXAPARIN SODIUM 40 MG/0.4ML ~~LOC~~ SOLN
40.0000 mg | Freq: Two times a day (BID) | SUBCUTANEOUS | Status: DC
  Administered 2016-04-27 – 2016-04-28 (×3): 40 mg via SUBCUTANEOUS
  Filled 2016-04-27 (×2): qty 0.4

## 2016-04-27 MED ORDER — GABAPENTIN 100 MG PO CAPS
400.0000 mg | ORAL_CAPSULE | Freq: Three times a day (TID) | ORAL | Status: DC
  Administered 2016-04-27 – 2016-04-28 (×4): 400 mg via ORAL
  Filled 2016-04-27 (×4): qty 1

## 2016-04-27 MED ORDER — OXYCODONE HCL 5 MG PO TABS
5.0000 mg | ORAL_TABLET | ORAL | Status: DC | PRN
  Administered 2016-04-27 (×2): 5 mg via ORAL
  Filled 2016-04-27 (×2): qty 1

## 2016-04-27 MED ORDER — INSULIN ASPART 100 UNIT/ML ~~LOC~~ SOLN: 0.0000 [IU] | Freq: Every day | SUBCUTANEOUS | Status: DC

## 2016-04-27 MED ORDER — CLOPIDOGREL BISULFATE 75 MG PO TABS
75.0000 mg | ORAL_TABLET | Freq: Once | ORAL | Status: AC
  Administered 2016-04-27: 03:00:00 75 mg via ORAL
  Filled 2016-04-27: qty 1

## 2016-04-27 MED ORDER — RAMIPRIL 10 MG PO CAPS
10.0000 mg | ORAL_CAPSULE | Freq: Every day | ORAL | Status: DC
Start: 2016-04-27 — End: 2016-04-28
  Administered 2016-04-27 – 2016-04-28 (×2): 10 mg via ORAL
  Filled 2016-04-27 (×2): qty 1

## 2016-04-27 MED ORDER — ARIPIPRAZOLE 10 MG PO TABS
5.0000 mg | ORAL_TABLET | Freq: Every day | ORAL | Status: DC
  Administered 2016-04-27 – 2016-04-28 (×2): 5 mg via ORAL
  Filled 2016-04-27 (×2): qty 1

## 2016-04-27 MED ORDER — RANOLAZINE ER 500 MG PO TB12
1000.0000 mg | ORAL_TABLET | Freq: Two times a day (BID) | ORAL | Status: DC
  Administered 2016-04-27 – 2016-04-28 (×3): 1000 mg via ORAL
  Filled 2016-04-27 (×5): qty 2

## 2016-04-27 MED ORDER — CEFTRIAXONE SODIUM-DEXTROSE 2-2.22 GM-% IV SOLR
2.0000 g | INTRAVENOUS | Status: DC
  Administered 2016-04-27: 2 g via INTRAVENOUS
  Filled 2016-04-27 (×2): qty 50

## 2016-04-27 MED ORDER — SODIUM CHLORIDE 0.9 % IV SOLN
INTRAVENOUS | Status: AC
  Administered 2016-04-27: 03:00:00 via INTRAVENOUS

## 2016-04-27 MED ORDER — ACETAMINOPHEN 650 MG RE SUPP: 650.0000 mg | Freq: Four times a day (QID) | RECTAL | Status: DC | PRN

## 2016-04-27 MED ORDER — ACETAMINOPHEN 325 MG PO TABS: 650.0000 mg | ORAL_TABLET | Freq: Four times a day (QID) | ORAL | Status: DC | PRN

## 2016-04-27 MED ORDER — FUROSEMIDE 20 MG PO TABS
20.0000 mg | ORAL_TABLET | Freq: Two times a day (BID) | ORAL | Status: DC
  Administered 2016-04-27 – 2016-04-28 (×3): 20 mg via ORAL
  Filled 2016-04-27 (×3): qty 1

## 2016-04-27 MED ORDER — MOMETASONE FURO-FORMOTEROL FUM 100-5 MCG/ACT IN AERO
2.0000 | INHALATION_SPRAY | Freq: Two times a day (BID) | RESPIRATORY_TRACT | Status: DC
  Administered 2016-04-27 – 2016-04-28 (×3): 2 via RESPIRATORY_TRACT
  Filled 2016-04-27: qty 8.8

## 2016-04-27 MED ORDER — ISOSORBIDE MONONITRATE ER 60 MG PO TB24
60.0000 mg | ORAL_TABLET | Freq: Every day | ORAL | Status: DC
  Administered 2016-04-27 – 2016-04-28 (×2): 60 mg via ORAL
  Filled 2016-04-27 (×2): qty 1

## 2016-04-27 MED ORDER — PANTOPRAZOLE SODIUM 40 MG PO TBEC
40.0000 mg | DELAYED_RELEASE_TABLET | Freq: Every day | ORAL | Status: DC
  Administered 2016-04-27 – 2016-04-28 (×2): 40 mg via ORAL
  Filled 2016-04-27 (×2): qty 1

## 2016-04-27 MED ORDER — MONTELUKAST SODIUM 10 MG PO TABS
10.0000 mg | ORAL_TABLET | Freq: Every day | ORAL | Status: DC
  Administered 2016-04-27: 10 mg via ORAL
  Filled 2016-04-27: qty 1

## 2016-04-27 MED ORDER — ZIPRASIDONE HCL 20 MG PO CAPS
60.0000 mg | ORAL_CAPSULE | Freq: Two times a day (BID) | ORAL | Status: DC
  Administered 2016-04-27 – 2016-04-28 (×2): 60 mg via ORAL
  Filled 2016-04-27: qty 1
  Filled 2016-04-27 (×2): qty 3

## 2016-04-27 MED ORDER — ONDANSETRON HCL 4 MG PO TABS: 4.0000 mg | ORAL_TABLET | Freq: Four times a day (QID) | ORAL | Status: DC | PRN

## 2016-04-27 MED ORDER — LORAZEPAM 1 MG PO TABS
1.0000 mg | ORAL_TABLET | Freq: Every day | ORAL | Status: DC
  Administered 2016-04-27: 21:00:00 1 mg via ORAL
  Filled 2016-04-27: qty 1

## 2016-04-27 MED ORDER — LEVOTHYROXINE SODIUM 100 MCG PO TABS
100.0000 ug | ORAL_TABLET | Freq: Every day | ORAL | Status: DC
  Administered 2016-04-27 – 2016-04-28 (×2): 100 ug via ORAL
  Filled 2016-04-27 (×2): qty 1

## 2016-04-27 MED ORDER — SODIUM CHLORIDE 0.9% FLUSH
3.0000 mL | Freq: Two times a day (BID) | INTRAVENOUS | Status: DC
  Administered 2016-04-27 – 2016-04-28 (×2): 3 mL via INTRAVENOUS

## 2016-04-27 MED ORDER — ONDANSETRON HCL 4 MG/2ML IJ SOLN
4.0000 mg | Freq: Four times a day (QID) | INTRAMUSCULAR | Status: DC | PRN
  Administered 2016-04-28: 10:00:00 4 mg via INTRAVENOUS
  Filled 2016-04-27: qty 2

## 2016-04-27 MED ORDER — INSULIN ASPART 100 UNIT/ML ~~LOC~~ SOLN
0.0000 [IU] | Freq: Three times a day (TID) | SUBCUTANEOUS | Status: DC
  Administered 2016-04-27 (×2): 2 [IU] via SUBCUTANEOUS
  Administered 2016-04-27 – 2016-04-28 (×3): 1 [IU] via SUBCUTANEOUS
  Filled 2016-04-27 (×3): qty 1
  Filled 2016-04-27 (×2): qty 2

## 2016-04-27 NOTE — Progress Notes (Signed)
Pharmacy Antibiotic Note  [name] is a 70 y.o. male admitted on 04/26/2016 with UTI.  Pharmacy has been consulted for ceftriaxone dosing.  Plan: Ceftriaxone 2 grams q 24 hours ordered  Height: 5\' 7"  (170.2 cm) Weight: (!) 343 lb (155.6 kg) IBW/kg (Calculated) : 66.1  Temp (24hrs), Avg:100.2 F (37.9 C), Min:98.8 F (37.1 C), Max:102.2 F (39 C)   Recent Labs Lab 04/26/16 2220 04/27/16 0016  WBC 8.1  --   CREATININE 1.53*  --   LATICACIDVEN  --  2.7*    Estimated Creatinine Clearance: 63.8 mL/min (by C-G formula based on SCr of 1.53 mg/dL (H)).    Allergies  Allergen Reactions  . Dilaudid [Hydromorphone Hcl] Other (See Comments)    CAN NOT HEAR, CAN NOT THINK  . Morphine And Related   . Tetracyclines & Related   . Penicillins Rash    Antimicrobials this admission: Levaquin x1   >>  ceftriaxone 12/4 >>   Dose adjustments this admission:   Microbiology results: 12/4 BCx: pending 12/3 UCx: pending      12/3 UA: LE(+) NO2(+) WBC TNTC  Thank you for allowing pharmacy to be a part of this patient's care.  Zaylyn Bergdoll S 04/27/2016 3:39 AM

## 2016-04-27 NOTE — ED Notes (Signed)
Lactic acid 2.7 . MD made aware  

## 2016-04-27 NOTE — Progress Notes (Signed)
Lovenox changed to BID for BMI >40 and CrCl >30.

## 2016-04-27 NOTE — Progress Notes (Addendum)
[facility] NAME: [name]    MR#:  [id number]  DATE OF BIRTH:  Nov 07, 1945  SUBJECTIVE:  Came inw with dysurai and weakness  REVIEW OF SYSTEMS:   Review of Systems  Constitutional: Negative for chills, fever and weight loss.  HENT: Negative for ear discharge, ear pain and nosebleeds.   Eyes: Negative for blurred vision, pain and discharge.  Respiratory: Negative for sputum production, shortness of breath, wheezing and stridor.   Cardiovascular: Negative for chest pain, palpitations, orthopnea and PND.  Gastrointestinal: Negative for abdominal pain, diarrhea, nausea and vomiting.  Genitourinary: Positive for dysuria and urgency. Negative for frequency.  Musculoskeletal: Negative for back pain and joint pain.  Neurological: Negative for sensory change, speech change, focal weakness and weakness.  Psychiatric/Behavioral: Negative for depression and hallucinations. The patient is not nervous/anxious.    Tolerating Diet:yes Tolerating PT: pending  DRUG ALLERGIES:   Allergies  Allergen Reactions  . Dilaudid [Hydromorphone Hcl] Other (See Comments)    CAN NOT HEAR, CAN NOT THINK  . Morphine And Related   . Tetracyclines & Related   . Penicillins Rash    VITALS:  Blood pressure (!) 115/45, pulse 73, temperature 98.1 F (36.7 C), temperature source Oral, resp. rate 20, height 5\' 7"  (1.702 m), weight (!) 155.6 kg (343 lb), SpO2 93 %.  PHYSICAL EXAMINATION:   Physical Exam  GENERAL:  70 y.o.-year-old patient lying in the bed with no acute distress. Morbidly obese EYES: Pupils equal, round, reactive to light and accommodation. No scleral icterus. Extraocular muscles intact.  HEENT: Head atraumatic, normocephalic. Oropharynx and nasopharynx clear.  NECK:  Supple, no jugular venous distention. No thyroid enlargement, no tenderness.  LUNGS: Normal breath sounds bilaterally, no wheezing, rales, rhonchi. No use of  accessory muscles of respiration.  CARDIOVASCULAR: S1, S2 normal. No murmurs, rubs, or gallops.  ABDOMEN: Soft, nontender, nondistended. Bowel sounds present. No organomegaly or mass.  EXTREMITIES: No cyanosis, clubbing or edema b/l.    NEUROLOGIC: Cranial nerves II through XII are intact. No focal Motor or sensory deficits b/l.   PSYCHIATRIC:  patient is alert and oriented x 3.  SKIN: No obvious rash, lesion, or ulcer.   LABORATORY PANEL:  CBC  Recent Labs Lab 04/27/16 0325  WBC 13.7*  HGB 11.8*  HCT 35.9*  PLT 170    Chemistries   Recent Labs Lab 04/26/16 2220 04/27/16 0325  NA 137 135  K 3.6 4.1  CL 106 105  CO2 22 23  GLUCOSE 169* 215*  BUN 22* 24*  CREATININE 1.53* 1.69*  CALCIUM 8.8* 8.2*  AST 44*  --   ALT 28  --   ALKPHOS 114  --   BILITOT 0.5  --    Cardiac Enzymes  Recent Labs Lab 04/27/16 1019  TROPONINI 0.10*   RADIOLOGY:  Dg Chest 2 View  Result Date: 04/27/2016 CLINICAL DATA:  Status post fall. Generalized weakness, shortness of breath and diaphoresis. Wheezing and tachypnea. Initial encounter. EXAM: CHEST  2 VIEW COMPARISON:  Chest radiograph performed 02/16/2016 FINDINGS: The lungs are well-aerated. There is elevation of the right hemidiaphragm. There is no evidence of focal opacification, pleural effusion or pneumothorax. The heart is mildly enlarged. The patient is status post median sternotomy. There are chronic fractures of the superiormost sternal wires. No acute osseous abnormalities are seen. IMPRESSION: Elevation of the right hemidiaphragm. Lungs remain grossly clear. Mild cardiomegaly. Electronically Signed   By: Francoise Schaumann.D.  On: 04/27/2016 00:43   Dg Sacrum/coccyx  Result Date: 04/27/2016 CLINICAL DATA:  Status post fall, with sacrococcygeal pain. Initial encounter. EXAM: SACRUM AND COCCYX - 2+ VIEW COMPARISON:  Lumbar spine radiographs performed 01/27/2013 FINDINGS: There is no definite evidence of fracture. Lucency overlying  the left-sided sacrum is thought to reflect overlying bowel gas. There is slight deformity of the sacrum on the lateral view, new from 2014. This may reflect remote injury. Scattered calcification is noted along the distal abdominal aorta. The sacroiliac joints are grossly unremarkable in appearance. IMPRESSION: 1. No definite evidence of fracture. 2. Slight deformity of the sacrum on the lateral view, new from 2014. This may reflect remote injury. 3. Scattered aortic atherosclerosis. Electronically Signed   By: Garald Balding M.D.   On: 04/27/2016 00:48   ASSESSMENT AND PLAN:  [name]  is a 70 y.o. male who presents with Weakness and shortness of breath. Patient states that he felt generally weak today, he has had increased urinary frequency lately. He also had a fair amount of shortness of breath today. He does have a history of chronic CHF as well as significant CAD. Here in the ED he was found to have UTI on UA, with positive nitrites. He also positive troponin, mildly elevated 0.25. BNP was mildly elevated at just over 200  *UTI (urinary tract infection) - IV antibiotics started in the ED and continued on admission, urine culture sent from the ED. Patient's lactic acid was mildly elevated, however suspect this is due to poor by mouth intake. IV fluids and place, we'll trend his lactate until within normal limits.  *  CAD (coronary artery disease) - continue home meds, suspect is mildly elevated troponins due to demand ischemia, however we will trend his enzymes tonight.  *  Chronic CHF (congestive heart failure) ([place]) - continue home meds, does not seem to be in significant exacerbation at this time.  *  Diabetes ([place]) - sliding scale insulin with corresponding glucose checks  *  HTN (hypertension) - patient is currently low and normotensive, we'll hold his antihypertensives for now, IV fluids for support as above  *  CKD (chronic kidney disease), stage III - creatinine close to baseline,  monitor closely and avoid nephrotoxins  Overall doing well. Physical therapy to see patient. If continues to improve discharge home tomorrow  Case discussed with Care Management/Social Worker. Management plans discussed with the patient, family and they are in agreement.  CODE STATUS:full DVT Prophylaxis: lovenox TOTAL TIME TAKING CARE OF THIS PATIENT: 30 minutes.  >50% time spent on counselling and coordination of care  POSSIBLE D/C IN 1-2 DAYS, DEPENDING ON CLINICAL CONDITION.  Note: This dictation was prepared with Dragon dictation along with smaller phrase technology. Any transcriptional errors that result from this process are unintentional.  Brendia Dampier M.D on 04/27/2016 at 1:28 PM  Between 7am to 6pm - Pager - [phone]  After 6pm go to [email or web address] - password [email or web address] [facility]  Office  [phone]  CC: Primary care physician; Volanda Napoleon, MD

## 2016-04-27 NOTE — ED Notes (Signed)
Critical troponin of 0.25. MD made aware.

## 2016-04-27 NOTE — ED Notes (Signed)
Pt given water to drink per MD request

## 2016-04-27 NOTE — H&P (Signed)
[facility] [place] NAME: [name]    MR#:  [id number]  DATE OF BIRTH:  [birth year]  DATE OF ADMISSION:  04/26/2016  PRIMARY CARE PHYSICIAN: Volanda Napoleon, MD   REQUESTING/REFERRING PHYSICIAN: Dahlia Client, MD  CHIEF COMPLAINT:   Chief Complaint  Patient presents with  . Weakness  . Shortness of Breath    HISTORY OF PRESENT ILLNESS:  [name]  is a 70 y.o. male who presents with Weakness and shortness of breath. Patient states that he felt generally weak today, he has had increased urinary frequency lately. He also had a fair amount of shortness of breath today. He does have a history of chronic CHF as well as significant CAD. Here in the ED he was found to have UTI on UA, with positive nitrites. He also positive troponin, mildly elevated 0.25. BNP was mildly elevated at just over 200. However, his chest x-ray did not show pulmonary edema or infiltrates. Hospitals were called for admission.  PAST MEDICAL HISTORY:   Past Medical History:  Diagnosis Date  . Arthritis   . CHF (congestive heart failure) ([place])   . Diabetes ([place])   . Heart trouble   . History of stomach ulcers   . Hypertension   . S/P CABG x 2   . Skin cancer   . Stroke [facility])     PAST SURGICAL HISTORY:   Past Surgical History:  Procedure Laterality Date  . CARDIAC SURGERY    . CHOLECYSTECTOMY    . GALLBLADDER SURGERY    . HEART BYPASS    . HERNIA REPAIR    . KIDNEY SURGERY      SOCIAL HISTORY:   Social History  Substance Use Topics  . Smoking status: Former Smoker    Quit date: 03/15/1963  . Smokeless tobacco: Never Used  . Alcohol use No    FAMILY HISTORY:   Family History  Problem Relation Age of Onset  . Diabetes Mother   . Heart attack Father   . Diabetes Brother   . Heart attack Brother     DRUG ALLERGIES:   Allergies  Allergen Reactions  . Dilaudid [Hydromorphone Hcl] Other (See Comments)    CAN NOT HEAR, CAN  NOT THINK  . Morphine And Related   . Tetracyclines & Related   . Penicillins Rash    MEDICATIONS AT HOME:   Prior to Admission medications   Medication Sig Start Date End Date Taking? Authorizing Provider  ADVAIR DISKUS 100-50 MCG/DOSE AEPB Inhale 1 puff into the lungs 2 (two) times daily.  05/31/13  Yes Historical Provider, MD  amLODipine (NORVASC) 10 MG tablet Take 10 mg by mouth daily.  05/31/13  Yes Historical Provider, MD  ARIPiprazole (ABILIFY) 5 MG tablet Take 5 mg by mouth daily.  05/31/13  Yes Historical Provider, MD  aspirin EC 81 MG tablet Take 81 mg by mouth daily.   Yes Historical Provider, MD  atorvastatin (LIPITOR) 80 MG tablet Take 80 mg by mouth every evening.   Yes Historical Provider, MD  BELSOMRA 20 MG TABS Take 20 mg by mouth at bedtime. 04/06/16  Yes Historical Provider, MD  buPROPion (WELLBUTRIN SR) 100 MG 12 hr tablet Take 100 mg by mouth daily.  05/31/13  Yes Historical Provider, MD  clopidogrel (PLAVIX) 75 MG tablet Take 75 mg by mouth once.  05/31/13  Yes Historical Provider, MD  furosemide (LASIX) 20 MG tablet Take 20 mg by mouth 2 (two) times daily.  05/31/13  Yes Historical Provider, MD  gabapentin (NEURONTIN) 400 MG capsule Take 400 mg by mouth 3 (three) times daily.  05/31/13  Yes Historical Provider, MD  hydrALAZINE (APRESOLINE) 25 MG tablet Take 25 mg by mouth 2 (two) times daily.   Yes Historical Provider, MD  isosorbide mononitrate (IMDUR) 60 MG 24 hr tablet Take 60 mg by mouth daily.  05/31/13  Yes Historical Provider, MD  levothyroxine (SYNTHROID, LEVOTHROID) 100 MCG tablet Take 100 mcg by mouth daily before breakfast.  05/31/13  Yes Historical Provider, MD  LORazepam (ATIVAN) 2 MG tablet Take 2 mg by mouth at bedtime.  05/31/13  Yes Historical Provider, MD  meclizine (ANTIVERT) 25 MG tablet Take 25 mg by mouth 2 (two) times daily as needed.  05/31/13  Yes Historical Provider, MD  metoprolol (LOPRESSOR) 50 MG tablet Take 50 mg by mouth daily.  05/31/13  Yes Historical  Provider, MD  montelukast (SINGULAIR) 10 MG tablet Take 1 tablet by mouth at bedtime.    Yes Historical Provider, MD  pantoprazole (PROTONIX) 40 MG tablet Take 1 tablet by mouth daily.   Yes Historical Provider, MD  ramipril (ALTACE) 10 MG capsule Take 10 mg by mouth daily.  05/31/13  Yes Historical Provider, MD  ranolazine (RANEXA) 500 MG 12 hr tablet Take 1,000 mg by mouth 2 (two) times daily.  05/31/13  Yes Historical Provider, MD  traMADol (ULTRAM) 50 MG tablet Take 100 mg by mouth 4 (four) times daily.  06/06/13  Yes Historical Provider, MD  ziprasidone (GEODON) 60 MG capsule Take 60 mg by mouth 2 (two) times daily with a meal.  05/31/13  Yes Historical Provider, MD  ketoconazole (NIZORAL) 2 % cream Apply 1 application topically daily. 08/06/15   Trula Slade, DPM    REVIEW OF SYSTEMS:  Review of Systems  Constitutional: Negative for chills, fever, malaise/fatigue and weight loss.  HENT: Negative for ear pain, hearing loss and tinnitus.   Eyes: Negative for blurred vision, double vision, pain and redness.  Respiratory: Positive for shortness of breath. Negative for cough and hemoptysis.   Cardiovascular: Negative for chest pain, palpitations, orthopnea and leg swelling.  Gastrointestinal: Negative for abdominal pain, constipation, diarrhea, nausea and vomiting.  Genitourinary: Positive for frequency. Negative for dysuria and hematuria.  Musculoskeletal: Negative for back pain, joint pain and neck pain.  Skin:       No acne, rash, or lesions  Neurological: Positive for weakness. Negative for dizziness, tremors and focal weakness.  Endo/Heme/Allergies: Negative for polydipsia. Does not bruise/bleed easily.  Psychiatric/Behavioral: Negative for depression. The patient is not nervous/anxious and does not have insomnia.      VITAL SIGNS:   Vitals:   04/26/16 2213 04/26/16 2217 04/26/16 2330 04/27/16 0027  BP:   114/65 115/63  Pulse:  94 83 75  Resp:   (!) 28 20  Temp:  (!) 102.2 F (39  C)  98.8 F (37.1 C)  TempSrc:  Oral  Oral  SpO2:  93% 93% 95%  Weight: (!) 155.6 kg (343 lb)     Height: 5\' 7"  (1.702 m)      Wt Readings from Last 3 Encounters:  04/26/16 (!) 155.6 kg (343 lb)  02/16/16 (!) 160.7 kg (354 lb 4.8 oz)  03/02/15 (!) 149.7 kg (330 lb)    PHYSICAL EXAMINATION:  Physical Exam  Vitals reviewed. Constitutional: He is oriented to person, place, and time. He appears well-developed and well-nourished. No distress.  HENT:  Head: Normocephalic and atraumatic.  Mouth/Throat: Oropharynx is clear and moist.  Eyes: Conjunctivae and EOM are normal. Pupils are equal, round, and reactive to light. No scleral icterus.  Neck: Normal range of motion. Neck supple. No JVD present. No thyromegaly present.  Cardiovascular: Normal rate, regular rhythm and intact distal pulses.  Exam reveals no gallop and no friction rub.   No murmur heard. Respiratory: Effort normal and breath sounds normal. No respiratory distress. He has no wheezes. He has no rales.  GI: Soft. Bowel sounds are normal. He exhibits no distension. There is no tenderness.  Musculoskeletal: Normal range of motion. He exhibits no edema.  No arthritis, no gout  Lymphadenopathy:    He has no cervical adenopathy.  Neurological: He is alert and oriented to person, place, and time. No cranial nerve deficit.  No dysarthria, no aphasia  Skin: Skin is warm and dry. No rash noted. No erythema.  Psychiatric: He has a normal mood and affect. His behavior is normal. Judgment and thought content normal.    LABORATORY PANEL:   CBC  Recent Labs Lab 04/26/16 2220  WBC 8.1  HGB 13.2  HCT 40.2  PLT 173   ------------------------------------------------------------------------------------------------------------------  Chemistries   Recent Labs Lab 04/26/16 2220  NA 137  K 3.6  CL 106  CO2 22  GLUCOSE 169*  BUN 22*  CREATININE 1.53*  CALCIUM 8.8*  AST 44*  ALT 28  ALKPHOS 114  BILITOT 0.5    ------------------------------------------------------------------------------------------------------------------  Cardiac Enzymes  Recent Labs Lab 04/26/16 2220  TROPONINI 0.25*   ------------------------------------------------------------------------------------------------------------------  RADIOLOGY:  Dg Chest 2 View  Result Date: 04/27/2016 CLINICAL DATA:  Status post fall. Generalized weakness, shortness of breath and diaphoresis. Wheezing and tachypnea. Initial encounter. EXAM: CHEST  2 VIEW COMPARISON:  Chest radiograph performed 02/16/2016 FINDINGS: The lungs are well-aerated. There is elevation of the right hemidiaphragm. There is no evidence of focal opacification, pleural effusion or pneumothorax. The heart is mildly enlarged. The patient is status post median sternotomy. There are chronic fractures of the superiormost sternal wires. No acute osseous abnormalities are seen. IMPRESSION: Elevation of the right hemidiaphragm. Lungs remain grossly clear. Mild cardiomegaly. Electronically Signed   By: Garald Balding M.D.   On: 04/27/2016 00:43   Dg Sacrum/coccyx  Result Date: 04/27/2016 CLINICAL DATA:  Status post fall, with sacrococcygeal pain. Initial encounter. EXAM: SACRUM AND COCCYX - 2+ VIEW COMPARISON:  Lumbar spine radiographs performed 01/27/2013 FINDINGS: There is no definite evidence of fracture. Lucency overlying the left-sided sacrum is thought to reflect overlying bowel gas. There is slight deformity of the sacrum on the lateral view, new from 2014. This may reflect remote injury. Scattered calcification is noted along the distal abdominal aorta. The sacroiliac joints are grossly unremarkable in appearance. IMPRESSION: 1. No definite evidence of fracture. 2. Slight deformity of the sacrum on the lateral view, new from 2014. This may reflect remote injury. 3. Scattered aortic atherosclerosis. Electronically Signed   By: Garald Balding M.D.   On: 04/27/2016 00:48     EKG:   Orders placed or performed during the hospital encounter of 02/16/16  . ED EKG within 10 minutes  . ED EKG within 10 minutes  . EKG 12-Lead (at 6am)  . EKG 12-Lead (Repeat cardiac markers, recurrent chest pain)  . EKG 12-Lead (Repeat cardiac markers, recurrent chest pain)  . EKG 12-Lead (at 6am)    IMPRESSION AND PLAN:  Principal Problem:   UTI (urinary tract infection) - IV antibiotics started in the ED and continued on  admission, urine culture sent from the ED. Patient's lactic acid was mildly elevated, however suspect this is due to poor by mouth intake. IV fluids and place, we'll trend his lactate until within normal limits. Active Problems:   CAD (coronary artery disease) - continue home meds, suspect is mildly elevated troponins due to demand ischemia, however we will trend his enzymes tonight.   Chronic CHF (congestive heart failure) ([facility]) - continue home meds, does not seem to be in significant exacerbation at this time.   Diabetes ([place]) - sliding scale insulin with corresponding glucose checks   HTN (hypertension) - patient is currently low and normotensive, we'll hold his antihypertensives for now, IV fluids for support as above   CKD (chronic kidney disease), stage III - creatinine close to baseline, monitor closely and avoid nephrotoxins  All the records are reviewed and case discussed with ED provider. Management plans discussed with the patient and/or family.  DVT PROPHYLAXIS: SubQ lovenox  GI PROPHYLAXIS: PPI  ADMISSION STATUS: Inpatient  CODE STATUS: Full Code Status History    Date Active Date Inactive Code Status Order ID Comments User Context   02/16/2016  8:34 PM 02/17/2016  3:40 PM Full Code [id number]  Harvie Bridge, DO ED    Advance Directive Documentation   Flowsheet Row Most Recent Value  Type of Advance Directive  Living will  Pre-existing out of facility DNR order (yellow form or pink MOST form)  No data  "MOST" Form in Place?  No data       TOTAL TIME TAKING CARE OF THIS PATIENT: 45 minutes.    Luke Falero [place] 04/27/2016, 1:04 AM  [facility]  Office  [phone]  CC: Primary care physician; Volanda Napoleon, MD

## 2016-04-27 NOTE — Care Management Important Message (Signed)
Important Message  Patient Details  Name: NOWELL SALKOWSKI MRN: [id number] Date of Birth: 03-18-1946   Medicare Important Message Given:  Yes    Shelbie Ammons, RN 04/27/2016, 9:31 AM

## 2016-04-27 NOTE — ED Provider Notes (Signed)
[facility] Emergency Department Provider Note   ____________________________________________   First MD Initiated Contact with Patient 04/26/16 2311     (approximate)  I have reviewed the triage vital signs and the nursing notes.   HISTORY  Chief Complaint Weakness and Shortness of Breath    HPI [name] is a 70 y.o. male who comes into the hospital today because he has been feeling weak. The patient reports that he fell at home today. He reports that the paramedics seemed to come out and told me he was running a fever. He reports that he fell between the bedside commode in his bed. He felt weak in his arms and legs for the last couple of days. Normally he can get up and use the bedside commode without any problems. He reports that he has been eating and drinking well and denies any dizziness or lightheadedness. He reports that he is just weak and cannot function right. The patient denies any headache, blurred vision. He had some chest pain yesterday morning but it went away. He also has some shortness of breath but reports that this always. The patient denies any abdominal pain, nausea, vomiting. He also denies pain with urination and back pain. The patient reports that he came in to get checked out since she just was not feeling very well.   Past Medical History:  Diagnosis Date  . Arthritis   . CHF (congestive heart failure) ([place])   . Diabetes ([place])   . Heart trouble   . History of stomach ulcers   . Hypertension   . S/P CABG x 2   . Skin cancer   . Stroke [facility])     Patient Active Problem List   Diagnosis Date Noted  . UTI (urinary tract infection) 04/27/2016  . CAD (coronary artery disease) 04/27/2016  . Diabetes ([place]) 04/27/2016  . HTN (hypertension) 04/27/2016  . Chronic CHF (congestive heart failure) ([place]) 04/27/2016  . CKD (chronic kidney disease), stage III 04/27/2016  . Chest pain, rule out acute myocardial infarction 02/16/2016      Past Surgical History:  Procedure Laterality Date  . CARDIAC SURGERY    . CHOLECYSTECTOMY    . GALLBLADDER SURGERY    . HEART BYPASS    . HERNIA REPAIR    . KIDNEY SURGERY      Prior to Admission medications   Medication Sig Start Date End Date Taking? Authorizing Provider  ADVAIR DISKUS 100-50 MCG/DOSE AEPB Inhale 1 puff into the lungs 2 (two) times daily.  05/31/13  Yes Historical Provider, MD  amLODipine (NORVASC) 10 MG tablet Take 10 mg by mouth daily.  05/31/13  Yes Historical Provider, MD  ARIPiprazole (ABILIFY) 5 MG tablet Take 5 mg by mouth daily.  05/31/13  Yes Historical Provider, MD  aspirin EC 81 MG tablet Take 81 mg by mouth daily.   Yes Historical Provider, MD  atorvastatin (LIPITOR) 80 MG tablet Take 80 mg by mouth every evening.   Yes Historical Provider, MD  BELSOMRA 20 MG TABS Take 20 mg by mouth at bedtime. 04/06/16  Yes Historical Provider, MD  buPROPion (WELLBUTRIN SR) 100 MG 12 hr tablet Take 100 mg by mouth daily.  05/31/13  Yes Historical Provider, MD  clopidogrel (PLAVIX) 75 MG tablet Take 75 mg by mouth once.  05/31/13  Yes Historical Provider, MD  furosemide (LASIX) 20 MG tablet Take 20 mg by mouth 2 (two) times daily.  05/31/13  Yes Historical Provider, MD  gabapentin (  NEURONTIN) 400 MG capsule Take 400 mg by mouth 3 (three) times daily.  05/31/13  Yes Historical Provider, MD  hydrALAZINE (APRESOLINE) 25 MG tablet Take 25 mg by mouth 2 (two) times daily.   Yes Historical Provider, MD  isosorbide mononitrate (IMDUR) 60 MG 24 hr tablet Take 60 mg by mouth daily.  05/31/13  Yes Historical Provider, MD  levothyroxine (SYNTHROID, LEVOTHROID) 100 MCG tablet Take 100 mcg by mouth daily before breakfast.  05/31/13  Yes Historical Provider, MD  LORazepam (ATIVAN) 2 MG tablet Take 2 mg by mouth at bedtime.  05/31/13  Yes Historical Provider, MD  meclizine (ANTIVERT) 25 MG tablet Take 25 mg by mouth 2 (two) times daily as needed.  05/31/13  Yes Historical Provider, MD  metoprolol  (LOPRESSOR) 50 MG tablet Take 50 mg by mouth daily.  05/31/13  Yes Historical Provider, MD  montelukast (SINGULAIR) 10 MG tablet Take 1 tablet by mouth at bedtime.    Yes Historical Provider, MD  pantoprazole (PROTONIX) 40 MG tablet Take 1 tablet by mouth daily.   Yes Historical Provider, MD  ramipril (ALTACE) 10 MG capsule Take 10 mg by mouth daily.  05/31/13  Yes Historical Provider, MD  ranolazine (RANEXA) 500 MG 12 hr tablet Take 1,000 mg by mouth 2 (two) times daily.  05/31/13  Yes Historical Provider, MD  traMADol (ULTRAM) 50 MG tablet Take 100 mg by mouth 4 (four) times daily.  06/06/13  Yes Historical Provider, MD  ziprasidone (GEODON) 60 MG capsule Take 60 mg by mouth 2 (two) times daily with a meal.  05/31/13  Yes Historical Provider, MD  ketoconazole (NIZORAL) 2 % cream Apply 1 application topically daily. 08/06/15   Trula Slade, DPM    Allergies Dilaudid [hydromorphone hcl]; Morphine and related; Tetracyclines & related; and Penicillins  Family History  Problem Relation Age of Onset  . Diabetes Mother   . Heart attack Father   . Diabetes Brother   . Heart attack Brother     Social History Social History  Substance Use Topics  . Smoking status: Former Smoker    Quit date: 03/15/1963  . Smokeless tobacco: Never Used  . Alcohol use No    Review of Systems Constitutional: fever/chills Eyes: No visual changes. ENT: No sore throat. Cardiovascular: Denies chest pain. Respiratory: Denies shortness of breath. Gastrointestinal: No abdominal pain.  No nausea, no vomiting.  No diarrhea.  No constipation. Genitourinary: Negative for dysuria. Musculoskeletal: Negative for back pain. Skin: Negative for rash. Neurological: Generalized weakness  10-point ROS otherwise negative.  ____________________________________________   PHYSICAL EXAM:  VITAL SIGNS: ED Triage Vitals  Enc Vitals Group     BP 04/26/16 2330 114/65     Pulse Rate 04/26/16 2217 94     Resp 04/26/16 2330  (!) 28     Temp 04/26/16 2217 (!) 102.2 F (39 C)     Temp Source 04/26/16 2217 Oral     SpO2 04/26/16 2217 93 %     Weight 04/26/16 2213 (!) 343 lb (155.6 kg)     Height 04/26/16 2213 5\' 7"  (1.702 m)     Head Circumference --      Peak Flow --      Pain Score 04/26/16 2214 0     Pain Loc --      Pain Edu? --      Excl. in [place]? --     Constitutional: Alert and oriented. Well appearing and in no acute distress. Eyes: Conjunctivae are normal.  PERRL. EOMI. Head: Atraumatic. Nose: No congestion/rhinnorhea. Mouth/Throat: Mucous membranes are moist.  Oropharynx non-erythematous. Cardiovascular: Normal rate, regular rhythm. Grossly normal heart sounds.  Good peripheral circulation. Respiratory: Mildly increased respiratory effort.  No retractions. Lungs CTAB. Gastrointestinal: Soft and nontender. No distention. Positive bowel sounds Musculoskeletal: No lower extremity tenderness nor edema.   Neurologic:  Normal speech and language. Skin:  Skin is warm, dry and intact.  Psychiatric: Mood and affect are normal.   ____________________________________________   LABS (all labs ordered are listed, but only abnormal results are displayed)  Labs Reviewed  URINALYSIS COMPLETEWITH MICROSCOPIC ([facility] ONLY) - Abnormal; Notable for the following:       Result Value   Color, Urine YELLOW (*)    APPearance HAZY (*)    Glucose, UA 150 (*)    Ketones, ur TRACE (*)    Hgb urine dipstick 1+ (*)    Protein, ur 30 (*)    Nitrite POSITIVE (*)    Leukocytes, UA 2+ (*)    Bacteria, UA MANY (*)    Squamous Epithelial / LPF 0-5 (*)    All other components within normal limits  COMPREHENSIVE METABOLIC PANEL - Abnormal; Notable for the following:    Glucose, Bld 169 (*)    BUN 22 (*)    Creatinine, Ser 1.53 (*)    Calcium 8.8 (*)    Albumin 3.0 (*)    AST 44 (*)    GFR calc non Af Amer 44 (*)    GFR calc Af Amer 51 (*)    All other components within normal limits  CBC WITH DIFFERENTIAL/PLATELET  - Abnormal; Notable for the following:    RDW 16.9 (*)    Neutro Abs 7.8 (*)    Lymphs Abs 0.1 (*)    Monocytes Absolute 0.1 (*)    All other components within normal limits  LACTIC ACID, PLASMA - Abnormal; Notable for the following:    Lactic Acid, Venous 2.7 (*)    All other components within normal limits  TROPONIN I - Abnormal; Notable for the following:    Troponin I 0.25 (*)    All other components within normal limits  BRAIN NATRIURETIC PEPTIDE - Abnormal; Notable for the following:    B Natriuretic Peptide 210.0 (*)    All other components within normal limits  URINE CULTURE  CULTURE, BLOOD (ROUTINE X 2)  CULTURE, BLOOD (ROUTINE X 2)  LIPASE, BLOOD  LACTIC ACID, PLASMA   ____________________________________________  EKG  ED ECG REPORT I, Loney Hering, the attending physician, personally viewed and interpreted this ECG.   Date: 04/27/2016  EKG Time: 047  Rate: 77  Rhythm: normal sinus rhythm  Axis: normal  Intervals:none  ST&T Change: none  ____________________________________________  RADIOLOGY  Chest x-ray Coccyx x-ray ____________________________________________   PROCEDURES  Procedure(s) performed: None  Procedures  Critical Care performed: No  ____________________________________________   INITIAL IMPRESSION / ASSESSMENT AND PLAN / ED COURSE  Pertinent labs & imaging results that were available during my care of the patient were reviewed by me and considered in my medical decision making (see chart for details).  This is a 70 year old male who comes into the hospital today with some weakness. He reports that he fell and he was not feeling very well. The patient did have a fever to 102.2 when he arrived here in the hospital. We did check some blood work it appears as though he has a urinary tract infection with bacteria and white blood cells. The  patient receive a 500 mL normal saline bolus as well as a dose of levofloxacin. Given the  patient's complaint of chest pain and shortness of breath I will also add a troponin to the patient's blood work. He also will receive a lactic acid given his fever. I will reassess the patient once he's had his medications. He reports that his coccyx hurts as well. I will send an x-ray for his coccyx.  Clinical Course as of Apr 28 119  Mon Apr 27, 2016  [id number] Elevation of the right hemidiaphragm. Lungs remain grossly clear. Mild cardiomegaly.   DG Chest 2 View [AW]  0119 1. No definite evidence of fracture. 2. Slight deformity of the sacrum on the lateral view, new from 2014. This may reflect remote injury. 3. Scattered aortic atherosclerosis.   DG Sacrum/Coccyx [AW]    Clinical Course User Index [AW] Loney Hering, MD   The patient's chest x-ray does not show any pneumonia nor does it show any pulmonary edema. The patient's coccyx x-ray does not show any acute fracture. The patient does have an elevated troponin of 0.25. He has not had this before. The patient does need to be admitted to the hospitalist service. He has an elevated lactic acid but is not having any tachycardia or hypotension. The patient be admitted to the hospitalist service for further evaluation  ____________________________________________   FINAL CLINICAL IMPRESSION(S) / ED DIAGNOSES  Final diagnoses:  Acute cystitis without hematuria  Weakness  Fever in other diseases  Elevated troponin  Lactic acidosis      NEW MEDICATIONS STARTED DURING THIS VISIT:  New Prescriptions   No medications on file     Note:  This document was prepared using Dragon voice recognition software and may include unintentional dictation errors.    Loney Hering, MD 04/27/16 0120

## 2016-04-27 NOTE — Care Management (Signed)
Admitted to [facility] with a urinary tract infection. Lives alone. Daughter is Bridget Hartshorn [phone]). Last seen Dr, Westley Gambles 3 month ago, next appointment this Friday. Prescriptions are filled at [facility] in [place]. No home Health. Receives personal care services for 2.5 hours a day Monday through Friday since 2003, This agency's name is [facility]. [facility] 2 years ago. Self feed, nursing assistants help with baths and dressing. Bedside commode, rolling walker, cane, and Life Alert in the home. No home oxygen. Good appetite. [place] prior to this admission. Doesn't drive,\. Friend Zane will transport. Shelbie Ammons RN MSN CCM Care Management

## 2016-04-28 LAB — ECHOCARDIOGRAM COMPLETE
HEIGHTINCHES: 67 in
Weight: 5488 oz

## 2016-04-28 LAB — GLUCOSE, CAPILLARY
Glucose-Capillary: 137 mg/dL — ABNORMAL HIGH (ref 65–99)
Glucose-Capillary: 150 mg/dL — ABNORMAL HIGH (ref 65–99)

## 2016-04-28 MED ORDER — CEFTRIAXONE SODIUM-DEXTROSE 1-3.74 GM-% IV SOLR: 1.0000 g | INTRAVENOUS | Status: DC

## 2016-04-28 MED ORDER — CEPHALEXIN 500 MG PO CAPS: 500.0000 mg | ORAL_CAPSULE | Freq: Two times a day (BID) | ORAL | 0 refills | Status: DC

## 2016-04-28 MED ORDER — CEPHALEXIN 500 MG PO CAPS
500.0000 mg | ORAL_CAPSULE | Freq: Two times a day (BID) | ORAL | Status: DC
  Administered 2016-04-28: 500 mg via ORAL
  Filled 2016-04-28: qty 1

## 2016-04-28 NOTE — Progress Notes (Signed)
SATURATION QUALIFICATIONS: (This note is used to comply with regulatory documentation for home oxygen)  Patient Saturations on Room Air at Rest = 94%  Patient Saturations on Room Air while Ambulating = 89%  Patient Saturations on 0 Liters of oxygen while Ambulating  **Patient unable to tolerate. HR increased to 130 with ambulation. Patient placed in wheelchair and returned to room. MD made aware.

## 2016-04-28 NOTE — Progress Notes (Signed)
Chaplain was making his rounds and visited with pt in room 119. Provided the ministry of prayer and a spiritual presence.    04/27/16 1310  Clinical Encounter Type  Visited With Patient  Visit Type Initial;Spiritual support  Referral From Nurse  Spiritual Encounters  Spiritual Needs Prayer

## 2016-04-28 NOTE — Care Management (Signed)
Physical therapy evaluation completed. Recommends home with home health and therapy in the home. Discussed home health agencies. [facility]. Floydene Flock, [facility] representative updated.  Discharge to home today per Dr. Fritzi Mandes.  Friend will transport. Shelbie Ammons RN MSN CCM Care Management

## 2016-04-28 NOTE — Progress Notes (Signed)
MD made aware of HR increase during ambulation. Proceed with discharge per Dr. Fritzi Mandes.   1:47 PM Discharge instructions given and went over with patient at bedside. All questions answered. Patient discharged home with friend via wheelchair by nursing staff. Madlyn Frankel, RN

## 2016-04-28 NOTE — Discharge Summary (Addendum)
[name] at [address] NAME: [name]    MR#:  [id number]  DATE OF BIRTH:  [birth year]  DATE OF ADMISSION:  04/26/2016 ADMITTING PHYSICIAN: Lance Coon, MD  DATE OF DISCHARGE: 04/28/16  PRIMARY CARE PHYSICIAN: Volanda Napoleon, MD    ADMISSION DIAGNOSIS:  Lactic acidosis [E87.2] Weakness [R53.1] Elevated troponin [R74.8] Acute cystitis without hematuria [N30.00] Fever in other diseases [R50.81]  DISCHARGE DIAGNOSIS:  UTI Morbid obesity HTN DM-2  SECONDARY DIAGNOSIS:   Past Medical History:  Diagnosis Date  . Arthritis   . CHF (congestive heart failure) ([place])   . Diabetes ([place])   . Heart trouble   . History of stomach ulcers   . Hypertension   . S/P CABG x 2   . Skin cancer   . Stroke [facility])     HOSPITAL COURSE:   JoelEastwoodis a 70 y.o.malewho presents with Weakness and shortness of breath. Patient states that he felt generally weak today, he has had increased urinary frequency lately. He also had a fair amount of shortness of breath today. He does have a history of chronic CHF as well as significant CAD. Here in the ED he was found to have UTI on UA, with positive nitrites. He also positive troponin, mildly elevated 0.25. BNP was mildly elevated at just over 200  *UTI (urinary tract infection) -received IV rocephin -UC GNR---change to po keflex -pt afebrile  Patient's lactic acid was mildly elevated, however suspect this is due to poor by mouth intake. -recieved IV fluids  *CAD (coronary artery disease) - continue home meds, suspect is mildly elevated troponins due to demand ischemia, however we will trend his enzymes tonight. -no cp -cont cardiac meds  *Chronic CHF (congestive heart failure) ([place]) - continue home meds, does not seem to be in significant exacerbation at this time.  *Diabetes ([place]) - sliding scale insulin with corresponding glucose checks  *HTN (hypertension) -  patient is currently low and normotensive, we'll hold his antihypertensives for now, IV fluids for support as above  *CKD (chronic kidney disease), stage III - creatinine close to baseline, monitor closely and avoid nephrotoxins * hypoxia with activity due to , morbid obesity, OSA, h/o CHF(not inCHF at present) -wuill ararange for oxygen at home  PT recommends HHPT and RN  Will d/c home later. He gets personal care services at home CONSULTS OBTAINED:    DRUG ALLERGIES:   Allergies  Allergen Reactions  . Dilaudid [Hydromorphone Hcl] Other (See Comments)    CAN NOT HEAR, CAN NOT THINK  . Morphine And Related   . Tetracyclines & Related   . Penicillins Rash    DISCHARGE MEDICATIONS:   Current Discharge Medication List    START taking these medications   Details  cephALEXin (KEFLEX) 500 MG capsule Take 1 capsule (500 mg total) by mouth every 12 (twelve) hours. Qty: 12 capsule, Refills: 0      CONTINUE these medications which have NOT CHANGED   Details  ADVAIR DISKUS 100-50 MCG/DOSE AEPB Inhale 1 puff into the lungs 2 (two) times daily.     amLODipine (NORVASC) 10 MG tablet Take 10 mg by mouth daily.     ARIPiprazole (ABILIFY) 5 MG tablet Take 5 mg by mouth daily.     aspirin EC 81 MG tablet Take 81 mg by mouth daily.    atorvastatin (LIPITOR) 80 MG tablet Take 80 mg by mouth every evening.    BELSOMRA 20 MG TABS Take 20 mg  by mouth at bedtime.    buPROPion (WELLBUTRIN SR) 100 MG 12 hr tablet Take 100 mg by mouth daily.     clopidogrel (PLAVIX) 75 MG tablet Take 75 mg by mouth once.     furosemide (LASIX) 20 MG tablet Take 20 mg by mouth 2 (two) times daily.     gabapentin (NEURONTIN) 400 MG capsule Take 400 mg by mouth 3 (three) times daily.     hydrALAZINE (APRESOLINE) 25 MG tablet Take 25 mg by mouth 2 (two) times daily.    isosorbide mononitrate (IMDUR) 60 MG 24 hr tablet Take 60 mg by mouth daily.     levothyroxine (SYNTHROID, LEVOTHROID) 100 MCG tablet  Take 100 mcg by mouth daily before breakfast.     LORazepam (ATIVAN) 2 MG tablet Take 2 mg by mouth at bedtime.     meclizine (ANTIVERT) 25 MG tablet Take 25 mg by mouth 2 (two) times daily as needed.     metoprolol (LOPRESSOR) 50 MG tablet Take 50 mg by mouth daily.     montelukast (SINGULAIR) 10 MG tablet Take 1 tablet by mouth at bedtime.     pantoprazole (PROTONIX) 40 MG tablet Take 1 tablet by mouth daily.    ramipril (ALTACE) 10 MG capsule Take 10 mg by mouth daily.     ranolazine (RANEXA) 500 MG 12 hr tablet Take 1,000 mg by mouth 2 (two) times daily.     traMADol (ULTRAM) 50 MG tablet Take 100 mg by mouth 4 (four) times daily.     ziprasidone (GEODON) 60 MG capsule Take 60 mg by mouth 2 (two) times daily with a meal.     ketoconazole (NIZORAL) 2 % cream Apply 1 application topically daily. Qty: 60 g, Refills: 2        If you experience worsening of your admission symptoms, develop shortness of breath, life threatening emergency, suicidal or homicidal thoughts you must seek medical attention immediately by calling 911 or calling your MD immediately  if symptoms less severe.  You Must read complete instructions/literature along with all the possible adverse reactions/side effects for all the Medicines you take and that have been prescribed to you. Take any new Medicines after you have completely understood and accept all the possible adverse reactions/side effects.   Please note  You were cared for by a hospitalist during your hospital stay. If you have any questions about your discharge medications or the care you received while you were in the hospital after you are discharged, you can call the unit and asked to speak with the hospitalist on call if the hospitalist that took care of you is not available. Once you are discharged, your primary care physician will handle any further medical issues. Please note that NO REFILLS for any discharge medications will be authorized once  you are discharged, as it is imperative that you return to your primary care physician (or establish a relationship with a primary care physician if you do not have one) for your aftercare needs so that they can reassess your need for medications and monitor your lab values. Today   SUBJECTIVE  No complaints. Wants more food   VITAL SIGNS:  Blood pressure (!) 149/57, pulse 78, temperature 98.3 F (36.8 C), temperature source Oral, resp. rate 20, height 5\' 7"  (1.702 m), weight (!) 155.6 kg (343 lb), SpO2 97 %.  I/O:   Intake/Output Summary (Last 24 hours) at 04/28/16 0950 Last data filed at 04/28/16 0351  Gross per 24 hour  Intake              240 ml  Output             2025 ml  Net            -1785 ml    PHYSICAL EXAMINATION:  GENERAL:  70 y.o.-year-old patient lying in the bed with no acute distress. obese EYES: Pupils equal, round, reactive to light and accommodation. No scleral icterus. Extraocular muscles intact.  HEENT: Head atraumatic, normocephalic. Oropharynx and nasopharynx clear.  NECK:  Supple, no jugular venous distention. No thyroid enlargement, no tenderness.  LUNGS: Normal breath sounds bilaterally, no wheezing, rales,rhonchi or crepitation. No use of accessory muscles of respiration.  CARDIOVASCULAR: S1, S2 normal. No murmurs, rubs, or gallops.  ABDOMEN: Soft, non-tender, non-distended. Bowel sounds present. No organomegaly or mass.  EXTREMITIES: No pedal edema, cyanosis, or clubbing.  NEUROLOGIC: Cranial nerves II through XII are intact. Muscle strength 5/5 in all extremities. Sensation intact. Gait not checked.  PSYCHIATRIC: The patient is alert and oriented x 3.  SKIN: No obvious rash, lesion, or ulcer.   DATA REVIEW:   CBC   Recent Labs Lab 04/27/16 0325  WBC 13.7*  HGB 11.8*  HCT 35.9*  PLT 170    Chemistries   Recent Labs Lab 04/26/16 2220 04/27/16 0325  NA 137 135  K 3.6 4.1  CL 106 105  CO2 22 23  GLUCOSE 169* 215*  BUN 22* 24*   CREATININE 1.53* 1.69*  CALCIUM 8.8* 8.2*  AST 44*  --   ALT 28  --   ALKPHOS 114  --   BILITOT 0.5  --     Microbiology Results   Recent Results (from the past 240 hour(s))  Urine culture     Status: Abnormal (Preliminary result)   Collection Time: 04/26/16 10:20 PM  Result Value Ref Range Status   Specimen Description URINE, RANDOM  Final   Special Requests NONE  Final   Culture >=100,000 COLONIES/mL GRAM NEGATIVE RODS (A)  Final   Report Status PENDING  Incomplete  CULTURE, BLOOD (ROUTINE X 2) w Reflex to ID Panel     Status: None (Preliminary result)   Collection Time: 04/27/16 12:16 AM  Result Value Ref Range Status   Specimen Description BLOOD BLOOD LEFT FOREARM  Final   Special Requests   Final    BOTTLES DRAWN AEROBIC AND ANAEROBIC [id number]   Culture NO GROWTH 1 DAY  Final   Report Status PENDING  Incomplete  CULTURE, BLOOD (ROUTINE X 2) w Reflex to ID Panel     Status: None (Preliminary result)   Collection Time: 04/27/16 12:16 AM  Result Value Ref Range Status   Specimen Description BLOOD BLOOD LEFT HAND  Final   Special Requests BOTTLES DRAWN AEROBIC AND ANAEROBIC [id number]  Final   Culture NO GROWTH 1 DAY  Final   Report Status PENDING  Incomplete    RADIOLOGY:  Dg Chest 2 View  Result Date: 04/27/2016 CLINICAL DATA:  Status post fall. Generalized weakness, shortness of breath and diaphoresis. Wheezing and tachypnea. Initial encounter. EXAM: CHEST  2 VIEW COMPARISON:  Chest radiograph performed 02/16/2016 FINDINGS: The lungs are well-aerated. There is elevation of the right hemidiaphragm. There is no evidence of focal opacification, pleural effusion or pneumothorax. The heart is mildly enlarged. The patient is status post median sternotomy. There are chronic fractures of the superiormost sternal wires. No acute osseous abnormalities are seen. IMPRESSION: Elevation of the right hemidiaphragm. Lungs  remain grossly clear. Mild cardiomegaly. Electronically  Signed   By: Garald Balding M.D.   On: 04/27/2016 00:43   Dg Sacrum/coccyx  Result Date: 04/27/2016 CLINICAL DATA:  Status post fall, with sacrococcygeal pain. Initial encounter. EXAM: SACRUM AND COCCYX - 2+ VIEW COMPARISON:  Lumbar spine radiographs performed 01/27/2013 FINDINGS: There is no definite evidence of fracture. Lucency overlying the left-sided sacrum is thought to reflect overlying bowel gas. There is slight deformity of the sacrum on the lateral view, new from 2014. This may reflect remote injury. Scattered calcification is noted along the distal abdominal aorta. The sacroiliac joints are grossly unremarkable in appearance. IMPRESSION: 1. No definite evidence of fracture. 2. Slight deformity of the sacrum on the lateral view, new from 2014. This may reflect remote injury. 3. Scattered aortic atherosclerosis. Electronically Signed   By: Garald Balding M.D.   On: 04/27/2016 00:48     Management plans discussed with the patient, family and they are in agreement.  CODE STATUS:     Code Status Orders        Start     Ordered   04/27/16 0229  Full code  Continuous     04/27/16 0228    Code Status History    Date Active Date Inactive Code Status Order ID Comments User Context   02/16/2016  8:34 PM 02/17/2016  3:40 PM Full Code [id number]  Harvie Bridge, DO ED    Advance Directive Documentation   Flowsheet Row Most Recent Value  Type of Advance Directive  Living will  Pre-existing out of facility DNR order (yellow form or pink MOST form)  No data  "MOST" Form in Place?  No data      TOTAL TIME TAKING CARE OF THIS PATIENT: 40 minutes.    Philamena Kramar M.D on 04/28/2016 at 9:50 AM  Between 7am to 6pm - Pager - [phone] After 6pm go to [email or web address] - password [email or web address]  Office  [phone]  CC: Primary care physician; Volanda Napoleon, MD

## 2016-04-28 NOTE — Evaluation (Signed)
Physical Therapy Evaluation Patient Details Name: [name] MRN: [id number] DOB: [birth year] Today's Date: 04/28/2016   History of Present Illness  presented to ER secondary to progressive weakness, SOB; admitted with UTI.  Clinical Impression  Upon evaluation, patient alert and oriented; follows simple commands and demonstrates fair/good insight and safety awareness. Bilat Ue/LE strength and ROM grossly symmetrical and WFL (bilat shoulder elevation with chronic, arthritic changes); no focal weakness, sensory deficit or pain noted.  Currently requiring mod assist for bed mobility (extensive assist for truncal elevation), but patient reports personal care assistant often available to assist with bed mobility in AM.  Able to complete sit/stand from various sitting surfaces (edge of bed, recliner) and short-distance gait (77') with bariatric RW and cga +1.  Notably SOB with exertion, desatting to 84-85% with activity on RA.  Requires seated rest period and 20-30 seconds for recovery to >92%.  Discussed need for activity pacing; patient with fair awareness and implementation.  Do recommend continued use of RW for all mobility at this time; patient voiced understanding and agreement.  Has access to RW within the home. May benefit from home O2-MD informed/aware. Would benefit from skilled PT to address above deficits and promote optimal return to PLOF; Recommend transition to [facility] upon discharge from acute hospitalization.     Follow Up Recommendations Home health PT    Equipment Recommendations   (may benefit from home O2-MD aware)    Recommendations for Other Services       Precautions / Restrictions Precautions Precautions: Fall Restrictions Weight Bearing Restrictions: No      Mobility  Bed Mobility Overal bed mobility: Needs Assistance Bed Mobility: Supine to Sit     Supine to sit: Mod assist     General bed mobility comments: extensive assist for truncal elevation; per  patient, PCA often assists with bed mobility in home environment  Transfers Overall transfer level: Needs assistance Equipment used: Rolling walker (2 wheeled) Transfers: Sit to/from Stand Sit to Stand: Min guard            Ambulation/Gait Ambulation/Gait assistance: Min guard Ambulation Distance (Feet): 40 Feet Assistive device: Rolling walker (2 wheeled)       General Gait Details: broad BOS with short, choppy steps; flat foot contact with limited heel strike/toe off. Distance limited by fatigue; mod desat to 85% on RA with minimal gait distance requiring seated rest period and 20-30 seconds for recovery to >92%  Stairs            Wheelchair Mobility    Modified Rankin (Stroke Patients Only)       Balance Overall balance assessment: Needs assistance Sitting-balance support: No upper extremity supported;Feet supported Sitting balance-Leahy Scale: Good     Standing balance support: Bilateral upper extremity supported Standing balance-Leahy Scale: Fair                               Pertinent Vitals/Pain Pain Assessment: No/denies pain    Home Living Family/patient expects to be discharged to:: Private residence Living Arrangements: Alone Available Help at Discharge: Personal care attendant Type of Home: House Home Access: Level entry     Home Layout: One level Home Equipment: Environmental consultant - 2 wheels;Cane - single point;Bedside commode Additional Comments: Has personal care assistant 2.5 hours/day, 7 days/week for assist with household activities, ADLs as needed    Prior Function Level of Independence: Needs assistance  Comments: Assist from PCA for self-care/ADLs and household chores as needed; mod indep without assist assist device for household distances, [facility] for very limited community mobility (to/from MD appointments).  Does endorse at least 1-2 falls in previous weeks/months     Hand Dominance        Extremity/Trunk  Assessment   Upper Extremity Assessment: Overall WFL for tasks assessed           Lower Extremity Assessment: Overall WFL for tasks assessed (grossly 4/5 throughout; mildly edematous)         Communication   Communication: No difficulties  Cognition Arousal/Alertness: Awake/alert Behavior During Therapy: WFL for tasks assessed/performed Overall Cognitive Status: Within Functional Limits for tasks assessed                      General Comments      Exercises Other Exercises Other Exercises: Additional 47' with RW, cga/min assist--deficits as noted above; continued desat with exertion requiring seated rest period for recovery Other Exercises: Verbally reviewed LE therex for HEP (ankle pumps, LAQs, marching)-patient voiced understanding.   Assessment/Plan    PT Assessment Patient needs continued PT services  PT Problem List Decreased strength;Decreased range of motion;Decreased activity tolerance;Decreased balance;Decreased mobility;Decreased cognition;Decreased knowledge of use of DME;Decreased safety awareness;Decreased knowledge of precautions;Cardiopulmonary status limiting activity;Obesity          PT Treatment Interventions DME instruction;Gait training;Functional mobility training;Therapeutic activities;Therapeutic exercise;Balance training;Patient/family education    PT Goals (Current goals can be found in the Care Plan section)  Acute Rehab PT Goals Patient Stated Goal: to go home this afternoon PT Goal Formulation: With patient Time For Goal Achievement: 05/12/16 Potential to Achieve Goals: Fair    Frequency Min 2X/week   Barriers to discharge Decreased caregiver support      Co-evaluation               End of Session Equipment Utilized During Treatment: Gait belt Activity Tolerance: Patient limited by fatigue Patient left: in chair;with call bell/phone within reach;with chair alarm set Nurse Communication: Mobility status          Time: 1041-1102 PT Time Calculation (min) (ACUTE ONLY): 21 min   Charges:   PT Evaluation $PT Eval Moderate Complexity: 1 Procedure PT Treatments $Gait Training: 8-22 mins   PT G Codes:        Kayleah Appleyard H. Owens Shark, PT, DPT, NCS 04/28/16, 11:30 AM [phone]

## 2016-04-29 LAB — URINE CULTURE

## 2016-05-02 LAB — CULTURE, BLOOD (ROUTINE X 2)
CULTURE: NO GROWTH
Culture: NO GROWTH

## 2016-05-14 ENCOUNTER — Encounter: Payer: Self-pay | Admitting: Podiatry

## 2016-05-14 ENCOUNTER — Ambulatory Visit (INDEPENDENT_AMBULATORY_CARE_PROVIDER_SITE_OTHER): Payer: Medicare Other | Admitting: Podiatry

## 2016-05-14 ENCOUNTER — Ambulatory Visit: Payer: Medicare Other | Admitting: Podiatry

## 2016-05-14 DIAGNOSIS — M79609 Pain in unspecified limb: Secondary | ICD-10-CM

## 2016-05-14 DIAGNOSIS — B351 Tinea unguium: Secondary | ICD-10-CM | POA: Diagnosis not present

## 2016-05-14 DIAGNOSIS — L608 Other nail disorders: Secondary | ICD-10-CM

## 2016-05-14 DIAGNOSIS — L603 Nail dystrophy: Secondary | ICD-10-CM

## 2016-05-15 NOTE — Progress Notes (Signed)
SUBJECTIVE Patient  presents to office today complaining of elongated, thickened nails. Pain while ambulating in shoes. Patient is unable to trim their own nails.   OBJECTIVE General Patient is awake, alert, and oriented x 3 and in no acute distress. Derm Skin is dry and supple bilateral. Negative open lesions or macerations. Remaining integument unremarkable. Nails are tender, long, thickened and dystrophic with subungual debris, consistent with onychomycosis, 1-5 bilateral. No signs of infection noted. Vasc  DP and PT pedal pulses palpable bilaterally. Temperature gradient within normal limits.  Neuro Epicritic and protective threshold sensation diminished bilaterally.  Musculoskeletal Exam No symptomatic pedal deformities noted bilateral. Muscular strength within normal limits.  ASSESSMENT 1. Onychodystrophic nails 1-5 bilateral with hyperkeratosis of nails.  2. Onychomycosis of nail due to dermatophyte bilateral 3. Pain in foot bilateral  PLAN OF CARE 1. Patient evaluated today.  2. Instructed to maintain good pedal hygiene and foot care.  3. Mechanical debridement of nails 1-5 bilaterally performed using a nail nipper. Filed with dremel without incident.  4. Return to clinic in 3 mos.    [name] M [name], DPM    

## 2016-07-13 ENCOUNTER — Emergency Department: Payer: Medicare Other

## 2016-07-13 ENCOUNTER — Encounter: Payer: Self-pay | Admitting: Emergency Medicine

## 2016-07-13 ENCOUNTER — Observation Stay
Admission: EM | Admit: 2016-07-13 | Discharge: 2016-07-14 | Disposition: A | Discharge status: Home / Self Care | Payer: Medicare Other | Attending: Internal Medicine | Admitting: Internal Medicine

## 2016-07-13 DIAGNOSIS — Z880 Allergy status to penicillin: Secondary | ICD-10-CM | POA: Insufficient documentation

## 2016-07-13 DIAGNOSIS — R079 Chest pain, unspecified: Secondary | ICD-10-CM | POA: Diagnosis not present

## 2016-07-13 DIAGNOSIS — Z8249 Family history of ischemic heart disease and other diseases of the circulatory system: Secondary | ICD-10-CM | POA: Insufficient documentation

## 2016-07-13 DIAGNOSIS — Z85828 Personal history of other malignant neoplasm of skin: Secondary | ICD-10-CM | POA: Diagnosis not present

## 2016-07-13 DIAGNOSIS — Z7982 Long term (current) use of aspirin: Secondary | ICD-10-CM | POA: Diagnosis not present

## 2016-07-13 DIAGNOSIS — Z8673 Personal history of transient ischemic attack (TIA), and cerebral infarction without residual deficits: Secondary | ICD-10-CM | POA: Diagnosis not present

## 2016-07-13 DIAGNOSIS — Z87891 Personal history of nicotine dependence: Secondary | ICD-10-CM | POA: Insufficient documentation

## 2016-07-13 DIAGNOSIS — I25110 Atherosclerotic heart disease of native coronary artery with unstable angina pectoris: Secondary | ICD-10-CM | POA: Diagnosis not present

## 2016-07-13 DIAGNOSIS — I509 Heart failure, unspecified: Secondary | ICD-10-CM | POA: Diagnosis not present

## 2016-07-13 DIAGNOSIS — Z951 Presence of aortocoronary bypass graft: Secondary | ICD-10-CM | POA: Diagnosis not present

## 2016-07-13 DIAGNOSIS — Z882 Allergy status to sulfonamides status: Secondary | ICD-10-CM | POA: Diagnosis not present

## 2016-07-13 DIAGNOSIS — N183 Chronic kidney disease, stage 3 (moderate): Secondary | ICD-10-CM | POA: Diagnosis not present

## 2016-07-13 DIAGNOSIS — Z888 Allergy status to other drugs, medicaments and biological substances status: Secondary | ICD-10-CM | POA: Insufficient documentation

## 2016-07-13 DIAGNOSIS — Z8719 Personal history of other diseases of the digestive system: Secondary | ICD-10-CM | POA: Diagnosis not present

## 2016-07-13 DIAGNOSIS — E1122 Type 2 diabetes mellitus with diabetic chronic kidney disease: Secondary | ICD-10-CM | POA: Diagnosis not present

## 2016-07-13 DIAGNOSIS — I130 Hypertensive heart and chronic kidney disease with heart failure and stage 1 through stage 4 chronic kidney disease, or unspecified chronic kidney disease: Secondary | ICD-10-CM | POA: Diagnosis not present

## 2016-07-13 DIAGNOSIS — M1990 Unspecified osteoarthritis, unspecified site: Secondary | ICD-10-CM | POA: Diagnosis not present

## 2016-07-13 DIAGNOSIS — Z9049 Acquired absence of other specified parts of digestive tract: Secondary | ICD-10-CM | POA: Diagnosis not present

## 2016-07-13 DIAGNOSIS — Z833 Family history of diabetes mellitus: Secondary | ICD-10-CM | POA: Insufficient documentation

## 2016-07-13 DIAGNOSIS — R9431 Abnormal electrocardiogram [ECG] [EKG]: Secondary | ICD-10-CM

## 2016-07-13 DIAGNOSIS — Z881 Allergy status to other antibiotic agents status: Secondary | ICD-10-CM | POA: Diagnosis not present

## 2016-07-13 DIAGNOSIS — Z79899 Other long term (current) drug therapy: Secondary | ICD-10-CM | POA: Diagnosis not present

## 2016-07-13 LAB — CBC WITH DIFFERENTIAL/PLATELET
BASOS ABS: 0.1 10*3/uL (ref 0–0.1)
Basophils Relative: 1 %
Eosinophils Absolute: 0.2 10*3/uL (ref 0–0.7)
Eosinophils Relative: 3 %
HEMATOCRIT: 38.8 % — AB (ref 40.0–52.0)
HEMOGLOBIN: 12.8 g/dL — AB (ref 13.0–18.0)
LYMPHS PCT: 21 %
Lymphs Abs: 1.6 10*3/uL (ref 1.0–3.6)
MCH: 26.9 pg (ref 26.0–34.0)
MCHC: 33 g/dL (ref 32.0–36.0)
MCV: 81.4 fL (ref 80.0–100.0)
MONO ABS: 0.9 10*3/uL (ref 0.2–1.0)
MONOS PCT: 12 %
NEUTROS ABS: 4.9 10*3/uL (ref 1.4–6.5)
NEUTROS PCT: 63 %
Platelets: 275 10*3/uL (ref 150–440)
RBC: 4.77 MIL/uL (ref 4.40–5.90)
RDW: 17.7 % — AB (ref 11.5–14.5)
WBC: 7.7 10*3/uL (ref 3.8–10.6)

## 2016-07-13 NOTE — ED Triage Notes (Signed)
Per EMS patient called out tonight c/o 8/10 CP on right side that's radiating straight through to right upper back.  As of triage, patient is now c/o 2/10 pain on same side with no SOB or tachypnea noted.  VS are WNL during triage.  Pt has hx of CHF, and the pain tonight started tonight when he went to use hid CPAP machine at home.

## 2016-07-13 NOTE — ED Provider Notes (Signed)
[facility] Emergency Department Provider Note   ____________________________________________   First MD Initiated Contact with Patient 07/13/16 2122     (approximate)  I have reviewed the triage vital signs and the nursing notes.   HISTORY  Chief Complaint Chest Pain    HPI [name] is a 71 y.o. male who was laying in bed with his CPAP on and got a sudden pain from the right anterior chest into his posterior chest. It was not worse with movement it did not get worse with deep breathing he was not more short of breath than usual he was not nauseated and did not get sweaty. Pain is getting better but still there. He has some tenderness in his chest on palpation but that's different from the pain he had. He does not believe this pain felt like his previous heart disease.  Past Medical History:  Diagnosis Date  . Arthritis   . CHF (congestive heart failure) ([place])   . Diabetes ([place])   . Heart trouble   . History of stomach ulcers   . Hypertension   . S/P CABG x 2   . Skin cancer   . Stroke [facility])     Patient Active Problem List   Diagnosis Date Noted  . UTI (urinary tract infection) 04/27/2016  . CAD (coronary artery disease) 04/27/2016  . Diabetes ([place]) 04/27/2016  . HTN (hypertension) 04/27/2016  . Chronic CHF (congestive heart failure) ([place]) 04/27/2016  . CKD (chronic kidney disease), stage III 04/27/2016  . Chest pain, rule out acute myocardial infarction 02/16/2016    Past Surgical History:  Procedure Laterality Date  . CARDIAC SURGERY    . CHOLECYSTECTOMY    . GALLBLADDER SURGERY    . HEART BYPASS    . HERNIA REPAIR    . KIDNEY SURGERY      Prior to Admission medications   Medication Sig Start Date End Date Taking? Authorizing Provider  ADVAIR DISKUS 100-50 MCG/DOSE AEPB Inhale 1 puff into the lungs 2 (two) times daily.  05/31/13   Historical Provider, MD  amLODipine (NORVASC) 10 MG tablet Take 10 mg by mouth daily.  05/31/13    Historical Provider, MD  ARIPiprazole (ABILIFY) 5 MG tablet Take 5 mg by mouth daily.  05/31/13   Historical Provider, MD  aspirin EC 81 MG tablet Take 81 mg by mouth daily.    Historical Provider, MD  atorvastatin (LIPITOR) 80 MG tablet Take 80 mg by mouth every evening.    Historical Provider, MD  BELSOMRA 20 MG TABS Take 20 mg by mouth at bedtime. 04/06/16   Historical Provider, MD  buPROPion (WELLBUTRIN SR) 100 MG 12 hr tablet Take 100 mg by mouth daily.  05/31/13   Historical Provider, MD  cephALEXin (KEFLEX) 500 MG capsule Take 1 capsule (500 mg total) by mouth every 12 (twelve) hours. 04/28/16   Fritzi Mandes, MD  clopidogrel (PLAVIX) 75 MG tablet Take 75 mg by mouth once.  05/31/13   Historical Provider, MD  furosemide (LASIX) 20 MG tablet Take 20 mg by mouth 2 (two) times daily.  05/31/13   Historical Provider, MD  gabapentin (NEURONTIN) 400 MG capsule Take 400 mg by mouth 3 (three) times daily.  05/31/13   Historical Provider, MD  hydrALAZINE (APRESOLINE) 25 MG tablet Take 25 mg by mouth 2 (two) times daily.    Historical Provider, MD  isosorbide mononitrate (IMDUR) 60 MG 24 hr tablet Take 60 mg by mouth daily.  05/31/13  Historical Provider, MD  ketoconazole (NIZORAL) 2 % cream Apply 1 application topically daily. 08/06/15   Trula Slade, DPM  levothyroxine (SYNTHROID, LEVOTHROID) 100 MCG tablet Take 100 mcg by mouth daily before breakfast.  05/31/13   Historical Provider, MD  LORazepam (ATIVAN) 2 MG tablet Take 2 mg by mouth at bedtime.  05/31/13   Historical Provider, MD  meclizine (ANTIVERT) 25 MG tablet Take 25 mg by mouth 2 (two) times daily as needed.  05/31/13   Historical Provider, MD  metoprolol (LOPRESSOR) 50 MG tablet Take 50 mg by mouth daily.  05/31/13   Historical Provider, MD  montelukast (SINGULAIR) 10 MG tablet Take 1 tablet by mouth at bedtime.     Historical Provider, MD  pantoprazole (PROTONIX) 40 MG tablet Take 1 tablet by mouth daily.    Historical Provider, MD  ramipril (ALTACE) 10  MG capsule Take 10 mg by mouth daily.  05/31/13   Historical Provider, MD  ranolazine (RANEXA) 500 MG 12 hr tablet Take 1,000 mg by mouth 2 (two) times daily.  05/31/13   Historical Provider, MD  traMADol (ULTRAM) 50 MG tablet Take 100 mg by mouth 4 (four) times daily.  06/06/13   Historical Provider, MD  ziprasidone (GEODON) 60 MG capsule Take 60 mg by mouth 2 (two) times daily with a meal.  05/31/13   Historical Provider, MD    Allergies Dilaudid [hydromorphone hcl]; Morphine and related; Tetracyclines & related; and Penicillins  Family History  Problem Relation Age of Onset  . Diabetes Mother   . Heart attack Father   . Diabetes Brother   . Heart attack Brother     Social History Social History  Substance Use Topics  . Smoking status: Former Smoker    Quit date: 03/15/1963  . Smokeless tobacco: Never Used  . Alcohol use No    Review of Systems Constitutional: No fever/chills Eyes: No visual changes. ENT: No sore throat. Cardiovascular:chest pain. Respiratory: Denies Change in his baseline shortness of breath. Gastrointestinal: No abdominal pain.  No nausea, no vomiting.  No diarrhea.  No constipation. Genitourinary: Negative for dysuria. Musculoskeletal: Negative for back pain. Skin: Negative for rash. Neurological: Negative for headaches, focal weakness or numbness.  10-point ROS otherwise negative.  ____________________________________________   PHYSICAL EXAM:  VITAL SIGNS: ED Triage Vitals  Enc Vitals Group     BP 07/13/16 2119 130/84     Pulse Rate 07/13/16 2119 74     Resp 07/13/16 2119 20     Temp 07/13/16 2119 98.3 F (36.8 C)     Temp Source 07/13/16 2119 Oral     SpO2 07/13/16 2119 97 %     Weight 07/13/16 2120 (!) 358 lb 7.5 oz (162.6 kg)     Height 07/13/16 2120 5\' 7"  (1.702 m)     Head Circumference --      Peak Flow --      Pain Score 07/13/16 2120 2     Pain Loc --      Pain Edu? --      Excl. in [place]? --     Constitutional: Alert and oriented.  Well appearing and in no acute distress. Eyes: Conjunctivae are normal. PERRL. EOMI. Head: Atraumatic. Nose: No congestion/rhinnorhea. Mouth/Throat: Mucous membranes are moist.  Oropharynx non-erythematous. Neck: No stridor.   Cardiovascular: Normal rate, regular rhythm. Grossly normal heart sounds.  Good peripheral circulation. Respiratory: Normal respiratory effort.  No retractions. Lungs CTAB. Gastrointestinal: Soft and nontender. No distention. No abdominal bruits. No  CVA tenderness. Musculoskeletal: No lower extremity tenderness nor edema.  No joint effusions. Neurologic:  Normal speech and language. No gross focal neurologic deficits are appreciated. No gait instability. Skin:  Skin is warm, dry and intact. No rash noted.  ____________________________________________   LABS (all labs ordered are listed, but only abnormal results are displayed)  Labs Reviewed  CBC WITH DIFFERENTIAL/PLATELET - Abnormal; Notable for the following:       Result Value   Hemoglobin 12.8 (*)    HCT 38.8 (*)    RDW 17.7 (*)    All other components within normal limits  COMPREHENSIVE METABOLIC PANEL  TROPONIN I   ____________________________________________  EKG  KG read and interpreted by me shows sinus rhythm rate of 73 normal axis there are flipped T waves inferiorly which appear to be new from previous EKG ____________________________________________  RADIOLOGY  Study Result   CLINICAL DATA:  Acute onset of right-sided chest pain, radiating to the right upper back. Initial encounter.  EXAM: CHEST  2 VIEW  COMPARISON:  Chest radiograph performed 04/26/2016  FINDINGS: The lungs are well-aerated. There is elevation of the right hemidiaphragm. Mild bibasilar atelectasis is noted. There is no evidence of pleural effusion or pneumothorax.  The heart is normal in size; the patient is status post median sternotomy. Chronic fractures of the superiormost sternal wires are noted. No  acute osseous abnormalities are seen.  IMPRESSION: Elevation of the right hemidiaphragm. Mild bibasilar atelectasis noted.   Electronically Signed   By: Garald Balding M.D.   On: 07/13/2016 21:47     ____________________________________________   PROCEDURES  Procedure(s) performed:  Procedures  Critical Care performed:  ____________________________________________   INITIAL IMPRESSION / ASSESSMENT AND PLAN / ED COURSE  Pertinent labs & imaging results that were available during my care of the patient were reviewed by me and considered in my medical decision making (see chart for details).       ____________________________________________   FINAL CLINICAL IMPRESSION(S) / ED DIAGNOSES  Final diagnoses:  Chest pain, unspecified type  Abnormal EKG      NEW MEDICATIONS STARTED DURING THIS VISIT:  New Prescriptions   No medications on file     Note:  This document was prepared using Dragon voice recognition software and may include unintentional dictation errors.    Nena Polio, MD 07/13/16 2350

## 2016-07-13 NOTE — ED Notes (Signed)
Patient transported to X-ray 

## 2016-07-14 DIAGNOSIS — R079 Chest pain, unspecified: Secondary | ICD-10-CM | POA: Diagnosis present

## 2016-07-14 LAB — BASIC METABOLIC PANEL
ANION GAP: 8 (ref 5–15)
BUN: 21 mg/dL — AB (ref 6–20)
CALCIUM: 8.5 mg/dL — AB (ref 8.9–10.3)
CO2: 25 mmol/L (ref 22–32)
Chloride: 103 mmol/L (ref 101–111)
Creatinine, Ser: 1.28 mg/dL — ABNORMAL HIGH (ref 0.61–1.24)
GFR calc Af Amer: >60 mL/min (ref >60)
GFR calc non Af Amer: 55 mL/min — ABNORMAL LOW (ref >60)
GLUCOSE: 203 mg/dL — AB (ref 65–99)
POTASSIUM: 4.1 mmol/L (ref 3.5–5.1)
Sodium: 136 mmol/L (ref 135–145)

## 2016-07-14 LAB — COMPREHENSIVE METABOLIC PANEL
ALT: 29 U/L (ref 17–63)
AST: 28 U/L (ref 15–41)
Albumin: 3.2 g/dL — ABNORMAL LOW (ref 3.5–5.0)
Alkaline Phosphatase: 76 U/L (ref 38–126)
Anion gap: 6 (ref 5–15)
BUN: 23 mg/dL — ABNORMAL HIGH (ref 6–20)
CHLORIDE: 106 mmol/L (ref 101–111)
CO2: 27 mmol/L (ref 22–32)
CREATININE: 1.32 mg/dL — AB (ref 0.61–1.24)
Calcium: 8.6 mg/dL — ABNORMAL LOW (ref 8.9–10.3)
GFR, EST NON AFRICAN AMERICAN: 53 mL/min — AB (ref >60)
Glucose, Bld: 105 mg/dL — ABNORMAL HIGH (ref 65–99)
POTASSIUM: 4 mmol/L (ref 3.5–5.1)
SODIUM: 139 mmol/L (ref 135–145)
Total Bilirubin: 0.4 mg/dL (ref 0.3–1.2)
Total Protein: 6.8 g/dL (ref 6.5–8.1)

## 2016-07-14 LAB — TROPONIN I: Troponin I: <0.03 ng/mL (ref <0.03)

## 2016-07-14 LAB — LIPID PANEL
Cholesterol: 132 mg/dL (ref 0–200)
HDL: 31 mg/dL — AB (ref >40)
LDL CALC: 52 mg/dL (ref 0–99)
TRIGLYCERIDES: 243 mg/dL — AB (ref <150)
Total CHOL/HDL Ratio: 4.3 RATIO
VLDL: 49 mg/dL — AB (ref 0–40)

## 2016-07-14 LAB — CBC
HEMATOCRIT: 38.3 % — AB (ref 40.0–52.0)
Hemoglobin: 12.5 g/dL — ABNORMAL LOW (ref 13.0–18.0)
MCH: 26.8 pg (ref 26.0–34.0)
MCHC: 32.6 g/dL (ref 32.0–36.0)
MCV: 82.3 fL (ref 80.0–100.0)
Platelets: 268 10*3/uL (ref 150–440)
RBC: 4.65 MIL/uL (ref 4.40–5.90)
RDW: 17.9 % — AB (ref 11.5–14.5)
WBC: 7.2 10*3/uL (ref 3.8–10.6)

## 2016-07-14 MED ORDER — MOMETASONE FURO-FORMOTEROL FUM 100-5 MCG/ACT IN AERO
2.0000 | INHALATION_SPRAY | Freq: Two times a day (BID) | RESPIRATORY_TRACT | Status: DC
  Administered 2016-07-14: 2 via RESPIRATORY_TRACT
  Filled 2016-07-14: qty 8.8

## 2016-07-14 MED ORDER — LEVOTHYROXINE SODIUM 50 MCG PO TABS
100.0000 ug | ORAL_TABLET | Freq: Every day | ORAL | Status: DC
  Administered 2016-07-14: 100 ug via ORAL
  Filled 2016-07-14: qty 2

## 2016-07-14 MED ORDER — NITROGLYCERIN 0.4 MG SL SUBL: 0.4000 mg | SUBLINGUAL_TABLET | SUBLINGUAL | Status: DC | PRN

## 2016-07-14 MED ORDER — HYDRALAZINE HCL 50 MG PO TABS
25.0000 mg | ORAL_TABLET | Freq: Two times a day (BID) | ORAL | Status: DC
  Administered 2016-07-14: 25 mg via ORAL
  Filled 2016-07-14: qty 1

## 2016-07-14 MED ORDER — ASPIRIN EC 81 MG PO TBEC: 81.0000 mg | DELAYED_RELEASE_TABLET | Freq: Every day | ORAL | Status: DC

## 2016-07-14 MED ORDER — HEPARIN SODIUM (PORCINE) 5000 UNIT/ML IJ SOLN
5000.0000 [IU] | Freq: Three times a day (TID) | INTRAMUSCULAR | Status: DC
  Administered 2016-07-14: 5000 [IU] via SUBCUTANEOUS
  Filled 2016-07-14: qty 1

## 2016-07-14 MED ORDER — BUPROPION HCL ER (SR) 100 MG PO TB12
100.0000 mg | ORAL_TABLET | Freq: Every day | ORAL | Status: DC
  Administered 2016-07-14: 100 mg via ORAL
  Filled 2016-07-14: qty 1

## 2016-07-14 MED ORDER — RAMIPRIL 10 MG PO CAPS
10.0000 mg | ORAL_CAPSULE | Freq: Every day | ORAL | Status: DC
  Administered 2016-07-14: 10 mg via ORAL
  Filled 2016-07-14: qty 1

## 2016-07-14 MED ORDER — METOPROLOL TARTRATE 50 MG PO TABS
50.0000 mg | ORAL_TABLET | Freq: Every day | ORAL | Status: DC
  Administered 2016-07-14: 50 mg via ORAL
  Filled 2016-07-14: qty 1

## 2016-07-14 MED ORDER — ZIPRASIDONE HCL 60 MG PO CAPS
60.0000 mg | ORAL_CAPSULE | Freq: Two times a day (BID) | ORAL | Status: DC
  Filled 2016-07-14: qty 1

## 2016-07-14 MED ORDER — SODIUM CHLORIDE 0.9% FLUSH: 3.0000 mL | INTRAVENOUS | Status: DC | PRN

## 2016-07-14 MED ORDER — GABAPENTIN 300 MG PO CAPS
400.0000 mg | ORAL_CAPSULE | Freq: Three times a day (TID) | ORAL | Status: DC
  Administered 2016-07-14: 400 mg via ORAL
  Filled 2016-07-14: qty 1

## 2016-07-14 MED ORDER — ASPIRIN 300 MG RE SUPP: 300.0000 mg | RECTAL | Status: AC

## 2016-07-14 MED ORDER — ATORVASTATIN CALCIUM 20 MG PO TABS: 80.0000 mg | ORAL_TABLET | Freq: Every evening | ORAL | Status: DC

## 2016-07-14 MED ORDER — ONDANSETRON HCL 4 MG/2ML IJ SOLN: 4.0000 mg | Freq: Four times a day (QID) | INTRAMUSCULAR | Status: DC | PRN

## 2016-07-14 MED ORDER — ACETAMINOPHEN 325 MG PO TABS: 650.0000 mg | ORAL_TABLET | ORAL | Status: DC | PRN

## 2016-07-14 MED ORDER — SUVOREXANT 10 MG PO TABS
10.0000 mg | ORAL_TABLET | Freq: Every day | ORAL | Status: DC
  Filled 2016-07-14: qty 1

## 2016-07-14 MED ORDER — MECLIZINE HCL 25 MG PO TABS: 25.0000 mg | ORAL_TABLET | Freq: Two times a day (BID) | ORAL | Status: DC | PRN

## 2016-07-14 MED ORDER — TRAMADOL HCL 50 MG PO TABS
100.0000 mg | ORAL_TABLET | Freq: Four times a day (QID) | ORAL | Status: DC
  Administered 2016-07-14: 100 mg via ORAL
  Filled 2016-07-14: qty 2

## 2016-07-14 MED ORDER — ISOSORBIDE MONONITRATE ER 60 MG PO TB24
60.0000 mg | ORAL_TABLET | Freq: Every day | ORAL | Status: DC
  Administered 2016-07-14: 60 mg via ORAL
  Filled 2016-07-14: qty 1

## 2016-07-14 MED ORDER — ALBUTEROL SULFATE HFA 108 (90 BASE) MCG/ACT IN AERS: 2.0000 | INHALATION_SPRAY | RESPIRATORY_TRACT | Status: DC | PRN

## 2016-07-14 MED ORDER — FUROSEMIDE 40 MG PO TABS
20.0000 mg | ORAL_TABLET | Freq: Two times a day (BID) | ORAL | Status: DC
  Administered 2016-07-14: 20 mg via ORAL
  Filled 2016-07-14: qty 1

## 2016-07-14 MED ORDER — ASPIRIN 81 MG PO CHEW
324.0000 mg | CHEWABLE_TABLET | ORAL | Status: AC
  Administered 2016-07-14: 324 mg via ORAL
  Filled 2016-07-14: qty 4

## 2016-07-14 MED ORDER — RANOLAZINE ER 500 MG PO TB12
1000.0000 mg | ORAL_TABLET | Freq: Two times a day (BID) | ORAL | Status: DC
  Administered 2016-07-14: 1000 mg via ORAL
  Filled 2016-07-14 (×2): qty 2

## 2016-07-14 MED ORDER — ARIPIPRAZOLE 10 MG PO TABS
5.0000 mg | ORAL_TABLET | Freq: Every day | ORAL | Status: DC
  Administered 2016-07-14: 5 mg via ORAL
  Filled 2016-07-14: qty 1

## 2016-07-14 MED ORDER — ZIPRASIDONE HCL 20 MG PO CAPS
60.0000 mg | ORAL_CAPSULE | Freq: Two times a day (BID) | ORAL | Status: DC
  Filled 2016-07-14: qty 3

## 2016-07-14 MED ORDER — MONTELUKAST SODIUM 10 MG PO TABS: 10.0000 mg | ORAL_TABLET | Freq: Every day | ORAL | Status: DC

## 2016-07-14 MED ORDER — SODIUM CHLORIDE 0.9% FLUSH: 3.0000 mL | Freq: Two times a day (BID) | INTRAVENOUS | Status: DC

## 2016-07-14 MED ORDER — CLOPIDOGREL BISULFATE 75 MG PO TABS
75.0000 mg | ORAL_TABLET | Freq: Once | ORAL | Status: AC
  Administered 2016-07-14: 75 mg via ORAL
  Filled 2016-07-14: qty 1

## 2016-07-14 MED ORDER — LORAZEPAM 2 MG PO TABS: 2.0000 mg | ORAL_TABLET | Freq: Every day | ORAL | Status: DC

## 2016-07-14 MED ORDER — PANTOPRAZOLE SODIUM 40 MG PO TBEC
40.0000 mg | DELAYED_RELEASE_TABLET | Freq: Every day | ORAL | Status: DC
  Administered 2016-07-14: 40 mg via ORAL
  Filled 2016-07-14: qty 1

## 2016-07-14 MED ORDER — IPRATROPIUM-ALBUTEROL 0.5-2.5 (3) MG/3ML IN SOLN: 3.0000 mL | RESPIRATORY_TRACT | Status: DC | PRN

## 2016-07-14 MED ORDER — SODIUM CHLORIDE 0.9 % IV SOLN: 250.0000 mL | INTRAVENOUS | Status: DC | PRN

## 2016-07-14 NOTE — Progress Notes (Signed)
[name] is a 71 y.o. male  XN:[id number]  Primary Cardiologist: Neoma Laming Reason for Consultation: chest pain  HPI: 48 YOWM with h/o CABG presented with right sided chest pain which is gone now.   Review of Systems:No SOB/PND   Past Medical History:  Diagnosis Date  . Arthritis   . CHF (congestive heart failure) ([place])   . Diabetes ([place])   . Heart trouble   . History of stomach ulcers   . Hypertension   . S/P CABG x 2   . Skin cancer   . Stroke [facility])      (Not in a hospital admission)   . ARIPiprazole  5 mg Oral Daily  . aspirin EC  81 mg Oral Daily  . [START ON 07/15/2016] aspirin EC  81 mg Oral Daily  . atorvastatin  80 mg Oral QPM  . buPROPion  100 mg Oral Daily  . furosemide  20 mg Oral BID  . gabapentin  400 mg Oral TID  . heparin  5,000 Units Subcutaneous Q8H  . hydrALAZINE  25 mg Oral BID  . isosorbide mononitrate  60 mg Oral Daily  . levothyroxine  100 mcg Oral QAC breakfast  . LORazepam  2 mg Oral QHS  . metoprolol  50 mg Oral Daily  . mometasone-formoterol  2 puff Inhalation BID  . montelukast  10 mg Oral QHS  . pantoprazole  40 mg Oral Daily  . ramipril  10 mg Oral Daily  . ranolazine  1,000 mg Oral BID  . sodium chloride flush  3 mL Intravenous Q12H  . Suvorexant  10 mg Oral QHS  . traMADol  100 mg Oral QID  . ziprasidone  60 mg Oral BID WC    Infusions: . sodium chloride      Allergies  Allergen Reactions  . Cephalosporins Other (See Comments)  . Dilaudid [Hydromorphone Hcl] Other (See Comments)    CAN NOT HEAR, CAN NOT THINK  . Hydrocodone-Acetaminophen Nausea And Vomiting  . Morphine And Related   . Tetracyclines & Related   . Penicillins Rash  . Sulfa Antibiotics Rash    Social History   Social History  . Marital status: Divorced    Spouse name: N/A  . Number of children: N/A  . Years of education: N/A   Occupational History  . Not on file.   Social History Main Topics  . Smoking status: Former Smoker    Quit  date: 03/15/1963  . Smokeless tobacco: Never Used  . Alcohol use No  . Drug use: No  . Sexual activity: Not on file   Other Topics Concern  . Not on file   Social History Narrative  . No narrative on file    Family History  Problem Relation Age of Onset  . Diabetes Mother   . Heart attack Father   . Diabetes Brother   . Heart attack Brother     PHYSICAL EXAM: Vitals:   07/14/16 0930 07/14/16 0950  BP: (!) 112/46 (!) 151/67  Pulse: 64 66  Resp: (!) 21 18  Temp:       Intake/Output Summary (Last 24 hours) at 07/14/16 1228 Last data filed at 07/14/16 0806  Gross per 24 hour  Intake                0 ml  Output             1000 ml  Net            -  1000 ml    General:  Well appearing. No respiratory difficulty HEENT: normal Neck: supple. no JVD. Carotids 2+ bilat; no bruits. No lymphadenopathy or thryomegaly appreciated. Cor: PMI nondisplaced. Regular rate & rhythm. No rubs, gallops or murmurs. Lungs: clear Abdomen: soft, nontender, nondistended. No hepatosplenomegaly. No bruits or masses. Good bowel sounds. Extremities: no cyanosis, clubbing, rash, edema Neuro: alert & oriented x 3, cranial nerves grossly intact. moves all 4 extremities w/o difficulty. Affect pleasant.  ECG: NSR NP ACUTE CHANGES  Results for orders placed or performed during the hospital encounter of 07/13/16 (from the past 24 hour(s))  CBC with Differential     Status: Abnormal   Collection Time: 07/13/16 10:57 PM  Result Value Ref Range   WBC 7.7 3.8 - 10.6 K/uL   RBC 4.77 4.40 - 5.90 MIL/uL   Hemoglobin 12.8 (L) 13.0 - 18.0 g/dL   HCT 38.8 (L) 40.0 - 52.0 %   MCV 81.4 80.0 - 100.0 fL   MCH 26.9 26.0 - 34.0 pg   MCHC 33.0 32.0 - 36.0 g/dL   RDW 17.7 (H) 11.5 - 14.5 %   Platelets 275 150 - 440 K/uL   Neutrophils Relative % 63 %   Neutro Abs 4.9 1.4 - 6.5 K/uL   Lymphocytes Relative 21 %   Lymphs Abs 1.6 1.0 - 3.6 K/uL   Monocytes Relative 12 %   Monocytes Absolute 0.9 0.2 - 1.0 K/uL    Eosinophils Relative 3 %   Eosinophils Absolute 0.2 0 - 0.7 K/uL   Basophils Relative 1 %   Basophils Absolute 0.1 0 - 0.1 K/uL  Comprehensive metabolic panel     Status: Abnormal   Collection Time: 07/13/16 11:16 PM  Result Value Ref Range   Sodium 139 135 - 145 mmol/L   Potassium 4.0 3.5 - 5.1 mmol/L   Chloride 106 101 - 111 mmol/L   CO2 27 22 - 32 mmol/L   Glucose, Bld 105 (H) 65 - 99 mg/dL   BUN 23 (H) 6 - 20 mg/dL   Creatinine, Ser 1.32 (H) 0.61 - 1.24 mg/dL   Calcium 8.6 (L) 8.9 - 10.3 mg/dL   Total Protein 6.8 6.5 - 8.1 g/dL   Albumin 3.2 (L) 3.5 - 5.0 g/dL   AST 28 15 - 41 U/L   ALT 29 17 - 63 U/L   Alkaline Phosphatase 76 38 - 126 U/L   Total Bilirubin 0.4 0.3 - 1.2 mg/dL   GFR calc non Af Amer 53 (L) >60 mL/min   GFR calc Af Amer >60 >60 mL/min   Anion gap 6 5 - 15  Troponin I     Status: None   Collection Time: 07/13/16 11:16 PM  Result Value Ref Range   Troponin I <0.03 <0.03 ng/mL  CBC     Status: Abnormal   Collection Time: 07/14/16  9:50 AM  Result Value Ref Range   WBC 7.2 3.8 - 10.6 K/uL   RBC 4.65 4.40 - 5.90 MIL/uL   Hemoglobin 12.5 (L) 13.0 - 18.0 g/dL   HCT 38.3 (L) 40.0 - 52.0 %   MCV 82.3 80.0 - 100.0 fL   MCH 26.8 26.0 - 34.0 pg   MCHC 32.6 32.0 - 36.0 g/dL   RDW 17.9 (H) 11.5 - 14.5 %   Platelets 268 150 - 440 K/uL  Troponin I     Status: None   Collection Time: 07/14/16  9:50 AM  Result Value Ref Range   Troponin I <0.03 <  0.03 ng/mL  Lipid panel     Status: Abnormal   Collection Time: 07/14/16  9:50 AM  Result Value Ref Range   Cholesterol 132 0 - 200 mg/dL   Triglycerides 243 (H) <150 mg/dL   HDL 31 (L) >40 mg/dL   Total CHOL/HDL Ratio 4.3 RATIO   VLDL 49 (H) 0 - 40 mg/dL   LDL Cholesterol 52 0 - 99 mg/dL  Basic metabolic panel     Status: Abnormal   Collection Time: 07/14/16  9:50 AM  Result Value Ref Range   Sodium 136 135 - 145 mmol/L   Potassium 4.1 3.5 - 5.1 mmol/L   Chloride 103 101 - 111 mmol/L   CO2 25 22 - 32 mmol/L    Glucose, Bld 203 (H) 65 - 99 mg/dL   BUN 21 (H) 6 - 20 mg/dL   Creatinine, Ser 1.28 (H) 0.61 - 1.24 mg/dL   Calcium 8.5 (L) 8.9 - 10.3 mg/dL   GFR calc non Af Amer 55 (L) >60 mL/min   GFR calc Af Amer >60 >60 mL/min   Anion gap 8 5 - 15   Dg Chest 2 View  Result Date: 07/13/2016 CLINICAL DATA:  Acute onset of right-sided chest pain, radiating to the right upper back. Initial encounter. EXAM: CHEST  2 VIEW COMPARISON:  Chest radiograph performed 04/26/2016 FINDINGS: The lungs are well-aerated. There is elevation of the right hemidiaphragm. Mild bibasilar atelectasis is noted. There is no evidence of pleural effusion or pneumothorax. The heart is normal in size; the patient is status post median sternotomy. Chronic fractures of the superiormost sternal wires are noted. No acute osseous abnormalities are seen. IMPRESSION: Elevation of the right hemidiaphragm. Mild bibasilar atelectasis noted. Electronically Signed   By: Garald Balding M.D.   On: 07/13/2016 21:47     ASSESSMENT AND PLAN:Atypical right sided chest pin and MI r/o. Advise d/c home with f/u office Thursday at 9 AM for setting up stress test in office.  Rory Montel A

## 2016-07-14 NOTE — Discharge Summary (Signed)
[place] [facility] [place] NAME: [name]    MR#:  [id number]  DATE OF BIRTH:  08/12/1945  DATE OF ADMISSION:  07/13/2016 ADMITTING PHYSICIAN: No admitting provider for patient encounter.  DATE OF DISCHARGE: 07/14/2016  PRIMARY CARE PHYSICIAN: Volanda Napoleon, MD    ADMISSION DIAGNOSIS:  chest pain  DISCHARGE DIAGNOSIS:  Active Problems:   Chest pain   SECONDARY DIAGNOSIS:   Past Medical History:  Diagnosis Date  . Arthritis   . CHF (congestive heart failure) ([place])   . Diabetes ([place])   . Heart trouble   . History of stomach ulcers   . Hypertension   . S/P CABG x 2   . Skin cancer   . Stroke [facility])     HOSPITAL COURSE:   Pt was monitored on tele, 2 troponin are negative 10 hrs apart. EKG is non significant. Pain was mainly on right side chest, which is resolved now. Pt is seen by his primary cardiologist , he suggest to d/c him and he can see him in office in next 2 days to do stress test.  DISCHARGE CONDITIONS:   Stable.  CONSULTS OBTAINED:    DRUG ALLERGIES:   Allergies  Allergen Reactions  . Cephalosporins Other (See Comments)  . Dilaudid [Hydromorphone Hcl] Other (See Comments)    CAN NOT HEAR, CAN NOT THINK  . Hydrocodone-Acetaminophen Nausea And Vomiting  . Morphine And Related   . Tetracyclines & Related   . Penicillins Rash  . Sulfa Antibiotics Rash    DISCHARGE MEDICATIONS:   Current Discharge Medication List    CONTINUE these medications which have NOT CHANGED   Details  ADVAIR DISKUS 100-50 MCG/DOSE AEPB Inhale 1 puff into the lungs 2 (two) times daily.     amLODipine (NORVASC) 10 MG tablet Take 10 mg by mouth daily.     ARIPiprazole (ABILIFY) 5 MG tablet Take 5 mg by mouth daily.     aspirin EC 81 MG tablet Take 81 mg by mouth daily.    atorvastatin (LIPITOR) 80 MG tablet Take 80 mg by mouth every evening.    BELSOMRA 10 MG TABS Take 10 mg by mouth at bedtime.      buPROPion (WELLBUTRIN SR) 100 MG 12 hr tablet Take 100 mg by mouth daily.     clopidogrel (PLAVIX) 75 MG tablet Take 75 mg by mouth once.     furosemide (LASIX) 20 MG tablet Take 20 mg by mouth 2 (two) times daily.     gabapentin (NEURONTIN) 400 MG capsule Take 400 mg by mouth 3 (three) times daily.     hydrALAZINE (APRESOLINE) 25 MG tablet Take 25 mg by mouth 2 (two) times daily.    ipratropium-albuterol (DUONEB) 0.5-2.5 (3) MG/3ML SOLN Inhale 3 mLs into the lungs every 4 (four) hours as needed.    isosorbide mononitrate (IMDUR) 60 MG 24 hr tablet Take 60 mg by mouth daily.     levothyroxine (SYNTHROID, LEVOTHROID) 100 MCG tablet Take 100 mcg by mouth daily before breakfast.     LORazepam (ATIVAN) 2 MG tablet Take 2 mg by mouth at bedtime.    meclizine (ANTIVERT) 25 MG tablet Take 25 mg by mouth 2 (two) times daily as needed.     metoprolol (LOPRESSOR) 50 MG tablet Take 50 mg by mouth daily.     montelukast (SINGULAIR) 10 MG tablet Take 1 tablet by mouth at bedtime.     pantoprazole (PROTONIX) 40 MG tablet Take  1 tablet by mouth daily.    PROAIR HFA 108 (90 Base) MCG/ACT inhaler Inhale 2 puffs into the lungs every 4 (four) hours as needed.    ramipril (ALTACE) 10 MG capsule Take 10 mg by mouth daily.     ranolazine (RANEXA) 500 MG 12 hr tablet Take 1,000 mg by mouth 2 (two) times daily.     traMADol (ULTRAM) 50 MG tablet Take 100 mg by mouth 4 (four) times daily.     ziprasidone (GEODON) 60 MG capsule Take 60 mg by mouth 2 (two) times daily with a meal.     celecoxib (CELEBREX) 200 MG capsule Take 1 capsule by mouth 2 (two) times daily.         DISCHARGE INSTRUCTIONS:    Follow with Dr. Humphrey Rolls in office Thursday morning at 9 am.  If you experience worsening of your admission symptoms, develop shortness of breath, life threatening emergency, suicidal or homicidal thoughts you must seek medical attention immediately by calling 911 or calling your MD immediately  if  symptoms less severe.  You Must read complete instructions/literature along with all the possible adverse reactions/side effects for all the Medicines you take and that have been prescribed to you. Take any new Medicines after you have completely understood and accept all the possible adverse reactions/side effects.   Please note  You were cared for by a hospitalist during your hospital stay. If you have any questions about your discharge medications or the care you received while you were in the hospital after you are discharged, you can call the unit and asked to speak with the hospitalist on call if the hospitalist that took care of you is not available. Once you are discharged, your primary care physician will handle any further medical issues. Please note that NO REFILLS for any discharge medications will be authorized once you are discharged, as it is imperative that you return to your primary care physician (or establish a relationship with a primary care physician if you do not have one) for your aftercare needs so that they can reassess your need for medications and monitor your lab values.    Today   CHIEF COMPLAINT:   Chief Complaint  Patient presents with  . Chest Pain    HISTORY OF PRESENT ILLNESS:  [name]  is a 71 y.o. male with a known history of Arthritis, congestive heart failure, diabetes mellitus type 2, hypertension, CVA presented to the emergency room with chest pain since yesterday night p.m. Patient was lying down and trying to go to sleep when he noticed chest pain on the right side of the chest. For sharp in nature 8 out of 10 on a scale of 1-10 and nonradiating. Patient follows up with Dr. Humphrey Rolls in the community for cardiology. Has some swelling of the legs and currently on oral Lasix for diuresis. No complaints of any shortness of breath, orthopnea. No fever or chills and cough. Patient was evaluated in the emergency room 12-lead EKG normal sinus rhythm with no ST  segment elevation. First set of troponin was negative. Hospitalist service was consulted for further care of the patient.   VITAL SIGNS:  Blood pressure (!) 151/67, pulse 66, temperature 98.3 F (36.8 C), temperature source Oral, resp. rate 18, height 5\' 7"  (1.702 m), weight (!) 162.6 kg (358 lb 7.5 oz), SpO2 96 %.  I/O:   Intake/Output Summary (Last 24 hours) at 07/14/16 1231 Last data filed at 07/14/16 0806  Gross per 24 hour  Intake                0 ml  Output             1000 ml  Net            -1000 ml    PHYSICAL EXAMINATION:   GENERAL:  71 y.o.-year-old obese patient lying in the bed with no acute distress.  EYES: Pupils equal, round, reactive to light and accommodation. No scleral icterus. Extraocular muscles intact.  HEENT: Head atraumatic, normocephalic. Oropharynx and nasopharynx clear.  NECK:  Supple, no jugular venous distention. No thyroid enlargement, no tenderness.  LUNGS: Normal breath sounds bilaterally, no wheezing, rales,rhonchi or crepitation. No use of accessory muscles of respiration.  CARDIOVASCULAR: S1, S2 normal. No murmurs, rubs, or gallops.  ABDOMEN: Soft, nontender, nondistended. Bowel sounds present. No organomegaly or mass.  EXTREMITIES: Has pedal edema,no cyanosis, or clubbing.  NEUROLOGIC: Cranial nerves II through XII are intact. Muscle strength 5/5 in all extremities. Sensation intact. Gait not checked.  PSYCHIATRIC: The patient is alert and oriented x 3.  SKIN: No obvious rash, lesion, or ulcer.   DATA REVIEW:   CBC  Recent Labs Lab 07/14/16 0950  WBC 7.2  HGB 12.5*  HCT 38.3*  PLT 268    Chemistries   Recent Labs Lab 07/13/16 2316 07/14/16 0950  NA 139 136  K 4.0 4.1  CL 106 103  CO2 27 25  GLUCOSE 105* 203*  BUN 23* 21*  CREATININE 1.32* 1.28*  CALCIUM 8.6* 8.5*  AST 28  --   ALT 29  --   ALKPHOS 76  --   BILITOT 0.4  --     Cardiac Enzymes  Recent Labs Lab 07/14/16 0950  TROPONINI <0.03    Microbiology  Results  Results for orders placed or performed during the hospital encounter of 04/26/16  Urine culture     Status: Abnormal   Collection Time: 04/26/16 10:20 PM  Result Value Ref Range Status   Specimen Description URINE, RANDOM  Final   Special Requests NONE  Final   Culture >=100,000 COLONIES/mL ESCHERICHIA COLI (A)  Final   Report Status 04/29/2016 FINAL  Final   Organism ID, Bacteria ESCHERICHIA COLI (A)  Final      Susceptibility   Escherichia coli - MIC*    AMPICILLIN >=32 RESISTANT Resistant     CEFAZOLIN 16 SENSITIVE Sensitive     CEFTRIAXONE <=1 SENSITIVE Sensitive     CIPROFLOXACIN >=4 RESISTANT Resistant     GENTAMICIN <=1 SENSITIVE Sensitive     IMIPENEM 1 SENSITIVE Sensitive     NITROFURANTOIN <=16 SENSITIVE Sensitive     TRIMETH/SULFA <=20 SENSITIVE Sensitive     AMPICILLIN/SULBACTAM 16 INTERMEDIATE Intermediate     PIP/TAZO <=4 SENSITIVE Sensitive     Extended ESBL NEGATIVE Sensitive     * >=100,000 COLONIES/mL ESCHERICHIA COLI  CULTURE, BLOOD (ROUTINE X 2) w Reflex to ID Panel     Status: None   Collection Time: 04/27/16 12:16 AM  Result Value Ref Range Status   Specimen Description BLOOD BLOOD LEFT FOREARM  Final   Special Requests   Final    BOTTLES DRAWN AEROBIC AND ANAEROBIC [id number]   Culture NO GROWTH 5 DAYS  Final   Report Status 05/02/2016 FINAL  Final  CULTURE, BLOOD (ROUTINE X 2) w Reflex to ID Panel     Status: None   Collection Time: 04/27/16 12:16 AM  Result Value Ref Range Status   Specimen  Description BLOOD BLOOD LEFT HAND  Final   Special Requests BOTTLES DRAWN AEROBIC AND ANAEROBIC [id number]  Final   Culture NO GROWTH 5 DAYS  Final   Report Status 05/02/2016 FINAL  Final    RADIOLOGY:  Dg Chest 2 View  Result Date: 07/13/2016 CLINICAL DATA:  Acute onset of right-sided chest pain, radiating to the right upper back. Initial encounter. EXAM: CHEST  2 VIEW COMPARISON:  Chest radiograph performed 04/26/2016 FINDINGS: The lungs are  well-aerated. There is elevation of the right hemidiaphragm. Mild bibasilar atelectasis is noted. There is no evidence of pleural effusion or pneumothorax. The heart is normal in size; the patient is status post median sternotomy. Chronic fractures of the superiormost sternal wires are noted. No acute osseous abnormalities are seen. IMPRESSION: Elevation of the right hemidiaphragm. Mild bibasilar atelectasis noted. Electronically Signed   By: Garald Balding M.D.   On: 07/13/2016 21:47    EKG:   Orders placed or performed during the hospital encounter of 07/13/16  . ED EKG within 10 minutes  . ED EKG within 10 minutes  . ED EKG  . ED EKG  . EKG 12-Lead  . EKG 12-Lead  . EKG 12-Lead  . EKG 12-Lead      Management plans discussed with the patient, family and they are in agreement.  CODE STATUS:     Code Status Orders        Start     Ordered   07/14/16 0811  Full code  Continuous     07/14/16 0810    Code Status History    Date Active Date Inactive Code Status Order ID Comments User Context   04/27/2016  2:28 AM 04/28/2016  5:18 PM Full Code [id number]  Lance Coon, MD Inpatient   02/16/2016  8:34 PM 02/17/2016  3:40 PM Full Code [id number]  Harvie Bridge, DO ED      TOTAL TIME TAKING CARE OF THIS PATIENT: 35 minutes.    Vaughan Basta M.D on 07/14/2016 at 12:31 PM  Between 7am to 6pm - Pager - [phone]  After 6pm go to [email or web address] - password [email or web address] [place] [facility]  Office  [phone]  CC: Primary care physician; Volanda Napoleon, MD   Note: This dictation was prepared with Dragon dictation along with smaller phrase technology. Any transcriptional errors that result from this process are unintentional.

## 2016-07-14 NOTE — H&P (Signed)
[place] [facility] [place] NAME: Musiq Vest    MR#:  [id number]  DATE OF BIRTH:  03-Sep-1945  DATE OF ADMISSION:  07/13/2016  PRIMARY CARE PHYSICIAN: Volanda Napoleon, MD   REQUESTING/REFERRING PHYSICIAN:   CHIEF COMPLAINT:   Chief Complaint  Patient presents with  . Chest Pain    HISTORY OF PRESENT ILLNESS: Hunberto Mangat  is a 71 y.o. male with a known history of Arthritis, congestive heart failure, diabetes mellitus type 2, hypertension, CVA presented to the emergency room with chest pain since yesterday night p.m. Patient was lying down and trying to go to sleep when he noticed chest pain on the right side of the chest. For sharp in nature 8 out of 10 on a scale of 1-10 and nonradiating. Patient follows up with Dr. Humphrey Rolls in the community for cardiology. Has some swelling of the legs and currently on oral Lasix for diuresis. No complaints of any shortness of breath, orthopnea. No fever or chills and cough. Patient was evaluated in the emergency room 12-lead EKG normal sinus rhythm with no ST segment elevation. First set of troponin was negative. Hospitalist service was consulted for further care of the patient.  PAST MEDICAL HISTORY:   Past Medical History:  Diagnosis Date  . Arthritis   . CHF (congestive heart failure) ([place])   . Diabetes ([place])   . Heart trouble   . History of stomach ulcers   . Hypertension   . S/P CABG x 2   . Skin cancer   . Stroke [facility])     PAST SURGICAL HISTORY: Past Surgical History:  Procedure Laterality Date  . CARDIAC SURGERY    . CHOLECYSTECTOMY    . GALLBLADDER SURGERY    . HEART BYPASS    . HERNIA REPAIR    . KIDNEY SURGERY      SOCIAL HISTORY:  Social History  Substance Use Topics  . Smoking status: Former Smoker    Quit date: 03/15/1963  . Smokeless tobacco: Never Used  . Alcohol use No    FAMILY HISTORY:  Family History  Problem Relation Age of Onset  . Diabetes Mother   .  Heart attack Father   . Diabetes Brother   . Heart attack Brother     DRUG ALLERGIES:  Allergies  Allergen Reactions  . Cephalosporins Other (See Comments)  . Dilaudid [Hydromorphone Hcl] Other (See Comments)    CAN NOT HEAR, CAN NOT THINK  . Hydrocodone-Acetaminophen Nausea And Vomiting  . Morphine And Related   . Tetracyclines & Related   . Penicillins Rash  . Sulfa Antibiotics Rash    REVIEW OF SYSTEMS:   CONSTITUTIONAL: No fever, fatigue or weakness.  EYES: No blurred or double vision.  EARS, NOSE, AND THROAT: No tinnitus or ear pain.  RESPIRATORY: No cough, shortness of breath, wheezing or hemoptysis.  CARDIOVASCULAR: Has chest pain,  No orthopnea  Has pedal edema.  GASTROINTESTINAL: No nausea, vomiting, diarrhea or abdominal pain.  GENITOURINARY: No dysuria, hematuria.  ENDOCRINE: No polyuria, nocturia,  HEMATOLOGY: No anemia, easy bruising or bleeding SKIN: No rash or lesion. MUSCULOSKELETAL: No joint pain or arthritis.   NEUROLOGIC: No tingling, numbness, weakness.  PSYCHIATRY: No anxiety or depression.   MEDICATIONS AT HOME:  Prior to Admission medications   Medication Sig Start Date End Date Taking? Authorizing Provider  ADVAIR DISKUS 100-50 MCG/DOSE AEPB Inhale 1 puff into the lungs 2 (two) times daily.  05/31/13  Yes Historical Provider,  MD  amLODipine (NORVASC) 10 MG tablet Take 10 mg by mouth daily.  05/31/13  Yes Historical Provider, MD  ARIPiprazole (ABILIFY) 5 MG tablet Take 5 mg by mouth daily.  05/31/13  Yes Historical Provider, MD  aspirin EC 81 MG tablet Take 81 mg by mouth daily.   Yes Historical Provider, MD  atorvastatin (LIPITOR) 80 MG tablet Take 80 mg by mouth every evening.   Yes Historical Provider, MD  BELSOMRA 10 MG TABS Take 10 mg by mouth at bedtime.  04/06/16  Yes Historical Provider, MD  buPROPion (WELLBUTRIN SR) 100 MG 12 hr tablet Take 100 mg by mouth daily.  05/31/13  Yes Historical Provider, MD  clopidogrel (PLAVIX) 75 MG tablet Take 75 mg  by mouth once.  05/31/13  Yes Historical Provider, MD  furosemide (LASIX) 20 MG tablet Take 20 mg by mouth 2 (two) times daily.  05/31/13  Yes Historical Provider, MD  gabapentin (NEURONTIN) 400 MG capsule Take 400 mg by mouth 3 (three) times daily.  05/31/13  Yes Historical Provider, MD  hydrALAZINE (APRESOLINE) 25 MG tablet Take 25 mg by mouth 2 (two) times daily.   Yes Historical Provider, MD  ipratropium-albuterol (DUONEB) 0.5-2.5 (3) MG/3ML SOLN Inhale 3 mLs into the lungs every 4 (four) hours as needed. 05/01/16  Yes Historical Provider, MD  isosorbide mononitrate (IMDUR) 60 MG 24 hr tablet Take 60 mg by mouth daily.  05/31/13  Yes Historical Provider, MD  levothyroxine (SYNTHROID, LEVOTHROID) 100 MCG tablet Take 100 mcg by mouth daily before breakfast.  05/31/13  Yes Historical Provider, MD  LORazepam (ATIVAN) 2 MG tablet Take 2 mg by mouth at bedtime.   Yes Historical Provider, MD  meclizine (ANTIVERT) 25 MG tablet Take 25 mg by mouth 2 (two) times daily as needed.  05/31/13  Yes Historical Provider, MD  metoprolol (LOPRESSOR) 50 MG tablet Take 50 mg by mouth daily.  05/31/13  Yes Historical Provider, MD  montelukast (SINGULAIR) 10 MG tablet Take 1 tablet by mouth at bedtime.    Yes Historical Provider, MD  pantoprazole (PROTONIX) 40 MG tablet Take 1 tablet by mouth daily.   Yes Historical Provider, MD  PROAIR HFA 108 (90 Base) MCG/ACT inhaler Inhale 2 puffs into the lungs every 4 (four) hours as needed. 05/01/16  Yes Historical Provider, MD  ramipril (ALTACE) 10 MG capsule Take 10 mg by mouth daily.  05/31/13  Yes Historical Provider, MD  ranolazine (RANEXA) 500 MG 12 hr tablet Take 1,000 mg by mouth 2 (two) times daily.  05/31/13  Yes Historical Provider, MD  traMADol (ULTRAM) 50 MG tablet Take 100 mg by mouth 4 (four) times daily.  06/06/13  Yes Historical Provider, MD  ziprasidone (GEODON) 60 MG capsule Take 60 mg by mouth 2 (two) times daily with a meal.  05/31/13  Yes Historical Provider, MD  celecoxib  (CELEBREX) 200 MG capsule Take 1 capsule by mouth 2 (two) times daily. 05/21/16   Historical Provider, MD      PHYSICAL EXAMINATION:   VITAL SIGNS: Blood pressure 134/65, pulse 62, temperature 98.3 F (36.8 C), temperature source Oral, resp. rate (!) 21, height 5\' 7"  (1.702 m), weight (!) 162.6 kg (358 lb 7.5 oz), SpO2 96 %.  GENERAL:  71 y.o.-year-old obese patient lying in the bed with no acute distress.  EYES: Pupils equal, round, reactive to light and accommodation. No scleral icterus. Extraocular muscles intact.  HEENT: Head atraumatic, normocephalic. Oropharynx and nasopharynx clear.  NECK:  Supple, no jugular venous  distention. No thyroid enlargement, no tenderness.  LUNGS: Normal breath sounds bilaterally, no wheezing, rales,rhonchi or crepitation. No use of accessory muscles of respiration.  CARDIOVASCULAR: S1, S2 normal. No murmurs, rubs, or gallops.  ABDOMEN: Soft, nontender, nondistended. Bowel sounds present. No organomegaly or mass.  EXTREMITIES: Has pedal edema,no cyanosis, or clubbing.  NEUROLOGIC: Cranial nerves II through XII are intact. Muscle strength 5/5 in all extremities. Sensation intact. Gait not checked.  PSYCHIATRIC: The patient is alert and oriented x 3.  SKIN: No obvious rash, lesion, or ulcer.   LABORATORY PANEL:   CBC  Recent Labs Lab 07/13/16 2257  WBC 7.7  HGB 12.8*  HCT 38.8*  PLT 275  MCV 81.4  MCH 26.9  MCHC 33.0  RDW 17.7*  LYMPHSABS 1.6  MONOABS 0.9  EOSABS 0.2  BASOSABS 0.1   ------------------------------------------------------------------------------------------------------------------  Chemistries   Recent Labs Lab 07/13/16 2316  NA 139  K 4.0  CL 106  CO2 27  GLUCOSE 105*  BUN 23*  CREATININE 1.32*  CALCIUM 8.6*  AST 28  ALT 29  ALKPHOS 76  BILITOT 0.4   ------------------------------------------------------------------------------------------------------------------ estimated creatinine clearance is 76 mL/min  (by C-G formula based on SCr of 1.32 mg/dL (H)). ------------------------------------------------------------------------------------------------------------------ No results for input(s): TSH, T4TOTAL, T3FREE, THYROIDAB in the last 72 hours.  Invalid input(s): FREET3   Coagulation profile No results for input(s): INR, PROTIME in the last 168 hours. ------------------------------------------------------------------------------------------------------------------- No results for input(s): DDIMER in the last 72 hours. -------------------------------------------------------------------------------------------------------------------  Cardiac Enzymes  Recent Labs Lab 07/13/16 2316  TROPONINI <0.03   ------------------------------------------------------------------------------------------------------------------ Invalid input(s): POCBNP  ---------------------------------------------------------------------------------------------------------------  Urinalysis    Component Value Date/Time   COLORURINE YELLOW (A) 04/26/2016 2220   APPEARANCEUR HAZY (A) 04/26/2016 2220   APPEARANCEUR Hazy 08/20/2014 1116   LABSPEC 1.015 04/26/2016 2220   LABSPEC 1.016 08/20/2014 1116   PHURINE 5.0 04/26/2016 2220   GLUCOSEU 150 (A) 04/26/2016 2220   GLUCOSEU Negative 08/20/2014 1116   HGBUR 1+ (A) 04/26/2016 2220   BILIRUBINUR NEGATIVE 04/26/2016 2220   BILIRUBINUR Negative 08/20/2014 1116   KETONESUR TRACE (A) 04/26/2016 2220   PROTEINUR 30 (A) 04/26/2016 2220   NITRITE POSITIVE (A) 04/26/2016 2220   LEUKOCYTESUR 2+ (A) 04/26/2016 2220   LEUKOCYTESUR 3+ 08/20/2014 1116     RADIOLOGY: Dg Chest 2 View  Result Date: 07/13/2016 CLINICAL DATA:  Acute onset of right-sided chest pain, radiating to the right upper back. Initial encounter. EXAM: CHEST  2 VIEW COMPARISON:  Chest radiograph performed 04/26/2016 FINDINGS: The lungs are well-aerated. There is elevation of the right hemidiaphragm. Mild  bibasilar atelectasis is noted. There is no evidence of pleural effusion or pneumothorax. The heart is normal in size; the patient is status post median sternotomy. Chronic fractures of the superiormost sternal wires are noted. No acute osseous abnormalities are seen. IMPRESSION: Elevation of the right hemidiaphragm. Mild bibasilar atelectasis noted. Electronically Signed   By: Garald Balding M.D.   On: 07/13/2016 21:47    EKG: Orders placed or performed during the hospital encounter of 07/13/16  . ED EKG within 10 minutes  . ED EKG within 10 minutes  . ED EKG  . ED EKG  . EKG 12-Lead  . EKG 12-Lead  . EKG 12-Lead  . EKG 12-Lead    IMPRESSION AND PLAN: 71 year old male patient with history of congestive heart failure type 2 diabetes mellitus, coronary artery disease, CABG, CVA, hypertension presented to the emergency room with chest pain. Admitting diagnosis 1. Unstable angina 2. Coronary artery disease  3. Hypertension 4. Type 2 diabetes mellitus 5. Congestive heart failure Treatment plan Admit patient to telemetry observation bed Cycle troponin to rule out ischemia Check echocardiogram Cardiology consultation with Dr. Humphrey Rolls Resume aspirin and Plavix Continue Lasix for diuresis Monitor electrolytes DVT prophylaxis subcutaneous heparin Supportive care  All the records are reviewed and case discussed with ED provider. Management plans discussed with the patient, family and they are in agreement.  CODE STATUS:FULL CODE Surrogate decision maker : daughter Legrand Como coker ) Code Status History    Date Active Date Inactive Code Status Order ID Comments User Context   04/27/2016  2:28 AM 04/28/2016  5:18 PM Full Code [id number]  Lance Coon, MD Inpatient   02/16/2016  8:34 PM 02/17/2016  3:40 PM Full Code [id number]  Harvie Bridge, DO ED       TOTAL TIME TAKING CARE OF THIS PATIENT: 52 minutes.    Saundra Shelling M.D on 07/14/2016 at 2:51 AM  Between 7am to 6pm - Pager -  [phone]  After 6pm go to [email or web address] - password [email or web address] [place] [facility]  Office  [phone]  CC: Primary care physician; Volanda Napoleon, MD

## 2016-07-14 NOTE — Discharge Instructions (Signed)
Follow with Dr. Humphrey Rolls Thursday morning at 9 am.

## 2016-07-14 NOTE — Care Management Obs Status (Signed)
[place] NOTIFICATION   Patient Details  Name: [name] MRN: [id number] Date of Birth: January 02, 1946   Medicare Observation Status Notification Given:  Yes    Beau Fanny, RN 07/14/2016, 9:39 AM

## 2016-07-20 ENCOUNTER — Emergency Department: Payer: Medicare Other

## 2016-07-20 ENCOUNTER — Emergency Department
Admission: EM | Admit: 2016-07-20 | Discharge: 2016-07-21 | Disposition: A | Discharge status: Home / Self Care | Payer: Medicare Other | Source: Home / Self Care | Attending: Emergency Medicine | Admitting: Emergency Medicine

## 2016-07-20 DIAGNOSIS — N183 Chronic kidney disease, stage 3 (moderate): Secondary | ICD-10-CM

## 2016-07-20 DIAGNOSIS — Z7982 Long term (current) use of aspirin: Secondary | ICD-10-CM | POA: Insufficient documentation

## 2016-07-20 DIAGNOSIS — I2510 Atherosclerotic heart disease of native coronary artery without angina pectoris: Secondary | ICD-10-CM | POA: Insufficient documentation

## 2016-07-20 DIAGNOSIS — E1122 Type 2 diabetes mellitus with diabetic chronic kidney disease: Secondary | ICD-10-CM | POA: Insufficient documentation

## 2016-07-20 DIAGNOSIS — Z85828 Personal history of other malignant neoplasm of skin: Secondary | ICD-10-CM | POA: Diagnosis not present

## 2016-07-20 DIAGNOSIS — Y999 Unspecified external cause status: Secondary | ICD-10-CM | POA: Insufficient documentation

## 2016-07-20 DIAGNOSIS — I130 Hypertensive heart and chronic kidney disease with heart failure and stage 1 through stage 4 chronic kidney disease, or unspecified chronic kidney disease: Secondary | ICD-10-CM

## 2016-07-20 DIAGNOSIS — Z79899 Other long term (current) drug therapy: Secondary | ICD-10-CM | POA: Insufficient documentation

## 2016-07-20 DIAGNOSIS — Z7984 Long term (current) use of oral hypoglycemic drugs: Secondary | ICD-10-CM | POA: Insufficient documentation

## 2016-07-20 DIAGNOSIS — W010XXA Fall on same level from slipping, tripping and stumbling without subsequent striking against object, initial encounter: Secondary | ICD-10-CM | POA: Insufficient documentation

## 2016-07-20 DIAGNOSIS — S8391XA Sprain of unspecified site of right knee, initial encounter: Secondary | ICD-10-CM

## 2016-07-20 DIAGNOSIS — Y939 Activity, unspecified: Secondary | ICD-10-CM | POA: Insufficient documentation

## 2016-07-20 DIAGNOSIS — W19XXXA Unspecified fall, initial encounter: Secondary | ICD-10-CM | POA: Insufficient documentation

## 2016-07-20 DIAGNOSIS — I509 Heart failure, unspecified: Secondary | ICD-10-CM | POA: Insufficient documentation

## 2016-07-20 DIAGNOSIS — S82831A Other fracture of upper and lower end of right fibula, initial encounter for closed fracture: Secondary | ICD-10-CM | POA: Diagnosis not present

## 2016-07-20 DIAGNOSIS — Y929 Unspecified place or not applicable: Secondary | ICD-10-CM

## 2016-07-20 DIAGNOSIS — Z87891 Personal history of nicotine dependence: Secondary | ICD-10-CM | POA: Diagnosis not present

## 2016-07-20 DIAGNOSIS — S8991XA Unspecified injury of right lower leg, initial encounter: Secondary | ICD-10-CM | POA: Diagnosis present

## 2016-07-20 MED ORDER — OXYCODONE HCL 5 MG PO TABS: 5.0000 mg | ORAL_TABLET | ORAL | 0 refills | Status: DC | PRN

## 2016-07-20 MED ORDER — IBUPROFEN 600 MG PO TABS: 600.0000 mg | ORAL_TABLET | Freq: Three times a day (TID) | ORAL | 0 refills | Status: DC | PRN

## 2016-07-20 MED ORDER — OXYCODONE-ACETAMINOPHEN 5-325 MG PO TABS
2.0000 | ORAL_TABLET | Freq: Once | ORAL | Status: AC
  Administered 2016-07-20: 2 via ORAL
  Filled 2016-07-20: qty 2

## 2016-07-20 NOTE — Discharge Instructions (Signed)
It is normal for your knee to hurt for a week or 2. Please use your walker as needed and follow-up with orthopedic surgery within 1-2 weeks for recheck.

## 2016-07-20 NOTE — ED Provider Notes (Signed)
[facility] Emergency Department Provider Note  ____________________________________________   First MD Initiated Contact with Patient 07/20/16 2146     (approximate)  I have reviewed the triage vital signs and the nursing notes.   HISTORY  Chief Complaint Knee Injury    HPI [name] is a 71 y.o. male who comes to the emergency department with right knee pain after tripping and falling on his walker. He was able to ambulate thereafter. He has moderate severity and aching in his right knee nonradiating improved with rest worse with motion. He denies syncope. He did not hit his head. He denies chest pain shortness of breath abdominal pain nausea vomiting or palpitations. he denies hip pain.   Past Medical History:  Diagnosis Date  . Arthritis   . CHF (congestive heart failure) ([place])   . Diabetes ([place])   . Heart trouble   . History of stomach ulcers   . Hypertension   . S/P CABG x 2   . Skin cancer   . Stroke [facility])     Patient Active Problem List   Diagnosis Date Noted  . Chest pain 07/14/2016  . UTI (urinary tract infection) 04/27/2016  . CAD (coronary artery disease) 04/27/2016  . Diabetes ([place]) 04/27/2016  . HTN (hypertension) 04/27/2016  . Chronic CHF (congestive heart failure) ([place]) 04/27/2016  . CKD (chronic kidney disease), stage III 04/27/2016  . Chest pain, rule out acute myocardial infarction 02/16/2016    Past Surgical History:  Procedure Laterality Date  . CARDIAC SURGERY    . CHOLECYSTECTOMY    . GALLBLADDER SURGERY    . HEART BYPASS    . HERNIA REPAIR    . KIDNEY SURGERY      Prior to Admission medications   Medication Sig Start Date End Date Taking? Authorizing Provider  ADVAIR DISKUS 100-50 MCG/DOSE AEPB Inhale 1 puff into the lungs 2 (two) times daily.  05/31/13   Historical Provider, MD  amLODipine (NORVASC) 10 MG tablet Take 10 mg by mouth daily.  05/31/13   Historical Provider, MD  ARIPiprazole (ABILIFY) 5 MG  tablet Take 5 mg by mouth daily.  05/31/13   Historical Provider, MD  aspirin EC 81 MG tablet Take 81 mg by mouth daily.    Historical Provider, MD  atorvastatin (LIPITOR) 80 MG tablet Take 80 mg by mouth every evening.    Historical Provider, MD  BELSOMRA 10 MG TABS Take 10 mg by mouth at bedtime.  04/06/16   Historical Provider, MD  buPROPion (WELLBUTRIN SR) 100 MG 12 hr tablet Take 100 mg by mouth daily.  05/31/13   Historical Provider, MD  celecoxib (CELEBREX) 200 MG capsule Take 1 capsule by mouth 2 (two) times daily. 05/21/16   Historical Provider, MD  clopidogrel (PLAVIX) 75 MG tablet Take 75 mg by mouth once.  05/31/13   Historical Provider, MD  furosemide (LASIX) 20 MG tablet Take 20 mg by mouth 2 (two) times daily.  05/31/13   Historical Provider, MD  gabapentin (NEURONTIN) 400 MG capsule Take 400 mg by mouth 3 (three) times daily.  05/31/13   Historical Provider, MD  hydrALAZINE (APRESOLINE) 25 MG tablet Take 25 mg by mouth 2 (two) times daily.    Historical Provider, MD  ibuprofen (ADVIL,MOTRIN) 600 MG tablet Take 1 tablet (600 mg total) by mouth every 8 (eight) hours as needed. 07/20/16   Darel Hong, MD  ipratropium-albuterol (DUONEB) 0.5-2.5 (3) MG/3ML SOLN Inhale 3 mLs into the lungs every 4 (  four) hours as needed. 05/01/16   Historical Provider, MD  isosorbide mononitrate (IMDUR) 60 MG 24 hr tablet Take 60 mg by mouth daily.  05/31/13   Historical Provider, MD  levothyroxine (SYNTHROID, LEVOTHROID) 100 MCG tablet Take 100 mcg by mouth daily before breakfast.  05/31/13   Historical Provider, MD  LORazepam (ATIVAN) 2 MG tablet Take 2 mg by mouth at bedtime.    Historical Provider, MD  meclizine (ANTIVERT) 25 MG tablet Take 25 mg by mouth 2 (two) times daily as needed.  05/31/13   Historical Provider, MD  metoprolol (LOPRESSOR) 50 MG tablet Take 50 mg by mouth daily.  05/31/13   Historical Provider, MD  montelukast (SINGULAIR) 10 MG tablet Take 1 tablet by mouth at bedtime.     Historical Provider, MD    oxyCODONE (ROXICODONE) 5 MG immediate release tablet Take 1 tablet (5 mg total) by mouth every 4 (four) hours as needed for severe pain. 07/20/16   Darel Hong, MD  pantoprazole (PROTONIX) 40 MG tablet Take 1 tablet by mouth daily.    Historical Provider, MD  PROAIR HFA 108 [phone] Base) MCG/ACT inhaler Inhale 2 puffs into the lungs every 4 (four) hours as needed. 05/01/16   Historical Provider, MD  ramipril (ALTACE) 10 MG capsule Take 10 mg by mouth daily.  05/31/13   Historical Provider, MD  ranolazine (RANEXA) 500 MG 12 hr tablet Take 1,000 mg by mouth 2 (two) times daily.  05/31/13   Historical Provider, MD  traMADol (ULTRAM) 50 MG tablet Take 100 mg by mouth 4 (four) times daily.  06/06/13   Historical Provider, MD  ziprasidone (GEODON) 60 MG capsule Take 60 mg by mouth 2 (two) times daily with a meal.  05/31/13   Historical Provider, MD    Allergies Cephalosporins; Dilaudid [hydromorphone hcl]; Hydrocodone-acetaminophen; Morphine and related; Tetracyclines & related; Penicillins; and Sulfa antibiotics  Family History  Problem Relation Age of Onset  . Diabetes Mother   . Heart attack Father   . Diabetes Brother   . Heart attack Brother     Social History Social History  Substance Use Topics  . Smoking status: Former Smoker    Quit date: 03/15/1963  . Smokeless tobacco: Never Used  . Alcohol use No    Review of Systems Constitutional: No fever/chills Eyes: No visual changes. ENT: No sore throat. Cardiovascular: Denies chest pain. Respiratory: Denies shortness of breath. Gastrointestinal: No abdominal pain.  No nausea, no vomiting.  No diarrhea.  No constipation. Genitourinary: Negative for dysuria. Musculoskeletal: Negative for back pain.Positive for knee pain Skin: Negative for rash. Neurological: Negative for headaches, focal weakness or numbness.  10-point ROS otherwise negative.  ____________________________________________   PHYSICAL EXAM:  VITAL SIGNS: ED Triage  Vitals  Enc Vitals Group     BP 07/20/16 2137 (!) 141/71     Pulse Rate 07/20/16 2137 89     Resp 07/20/16 2137 20     Temp 07/20/16 2137 98.6 F (37 C)     Temp Source 07/20/16 2137 Oral     SpO2 07/20/16 2137 94 %     Weight 07/20/16 2137 (!) 360 lb (163.3 kg)     Height 07/20/16 2137 5\' 7"  (1.702 m)     Head Circumference --      Peak Flow --      Pain Score 07/20/16 2138 5     Pain Loc --      Pain Edu? --      Excl. in [place]? --  Constitutional: Alert and oriented x 4 well appearing nontoxic no diaphoresis speaks in full, clear sentences Eyes: PERRL EOMI. Head: Atraumatic. Nose: No congestion/rhinnorhea. Mouth/Throat: No trismus Neck: No stridor.   Cardiovascular: Normal rate, regular rhythm. Grossly normal heart sounds.  Good peripheral circulation. Respiratory: Normal respiratory effort.  No retractions. Lungs CTAB and moving good air Gastrointestinal: Soft nondistended nontender no rebound no guarding no peritonitis no McBurney's tenderness negative Rovsing's no costovertebral tenderness negative Murphy's Musculoskeletal: Right knee extensor mechanism intact in the grossly stable he does have ecchymosis and a moderate effusion   Neurologic:  Normal speech and language. No gross focal neurologic deficits are appreciated. Skin:  Skin is warm, dry and intact. No rash noted. Psychiatric: Mood and affect are normal. Speech and behavior are normal.  ____________________________________________   LABS (all labs ordered are listed, but only abnormal results are displayed)  Labs Reviewed - No data to display ____________________________________________  EKG   ____________________________________________  RADIOLOGY  X-ray with no fracture dislocation but effusion ____________________________________________   PROCEDURES  Procedure(s) performed: no  Procedures  Critical Care performed: no  ____________________________________________   INITIAL IMPRESSION /  ASSESSMENT AND PLAN / ED COURSE  Pertinent labs & imaging results that were available during my care of the patient were reviewed by me and considered in my medical decision making (see chart for details).  Fortunately the patient's x-rays negative for fracture and his knee is grossly stable. he likely sustained an internal derangement of his knee which may need to be followed up with orthopedics. Ace wrap applied and his pain is controlled. He is able to ambulate with his walker. I've given him a referral to see orthopedic surgery in a week or 2 once the swelling is gone down for possible MRI. He is medically stable for outpatient management.      ____________________________________________   FINAL CLINICAL IMPRESSION(S) / ED DIAGNOSES  Final diagnoses:  Sprain of right knee, unspecified ligament, initial encounter      NEW MEDICATIONS STARTED DURING THIS VISIT:  New Prescriptions   IBUPROFEN (ADVIL,MOTRIN) 600 MG TABLET    Take 1 tablet (600 mg total) by mouth every 8 (eight) hours as needed.   OXYCODONE (ROXICODONE) 5 MG IMMEDIATE RELEASE TABLET    Take 1 tablet (5 mg total) by mouth every 4 (four) hours as needed for severe pain.     Note:  This document was prepared using Dragon voice recognition software and may include unintentional dictation errors.     Darel Hong, MD 07/20/16 [phone]

## 2016-07-20 NOTE — ED Triage Notes (Signed)
Pt reports to ED w/ c/o mechanical fall resulting in R knee injury.  Pt A/OX4, denies CP, SOB, n/v/d, head injury or LOC.  Bruising and carpet burn noted to R knee.

## 2016-07-21 ENCOUNTER — Emergency Department
Admission: EM | Admit: 2016-07-21 | Discharge: 2016-07-21 | Disposition: A | Discharge status: Home / Self Care | Payer: Medicare Other | Attending: Student in an Organized Health Care Education/Training Program | Admitting: Student in an Organized Health Care Education/Training Program

## 2016-07-21 ENCOUNTER — Emergency Department: Payer: Medicare Other

## 2016-07-21 ENCOUNTER — Encounter: Payer: Self-pay | Admitting: Emergency Medicine

## 2016-07-21 DIAGNOSIS — W19XXXA Unspecified fall, initial encounter: Secondary | ICD-10-CM

## 2016-07-21 DIAGNOSIS — T148XXA Other injury of unspecified body region, initial encounter: Secondary | ICD-10-CM

## 2016-07-21 DIAGNOSIS — S82831A Other fracture of upper and lower end of right fibula, initial encounter for closed fracture: Secondary | ICD-10-CM | POA: Diagnosis not present

## 2016-07-21 LAB — COMPREHENSIVE METABOLIC PANEL
ALT: 21 U/L (ref 17–63)
ANION GAP: 7 (ref 5–15)
AST: 27 U/L (ref 15–41)
Albumin: 3.2 g/dL — ABNORMAL LOW (ref 3.5–5.0)
Alkaline Phosphatase: 72 U/L (ref 38–126)
BUN: 20 mg/dL (ref 6–20)
CHLORIDE: 104 mmol/L (ref 101–111)
CO2: 24 mmol/L (ref 22–32)
Calcium: 8.2 mg/dL — ABNORMAL LOW (ref 8.9–10.3)
Creatinine, Ser: 1.35 mg/dL — ABNORMAL HIGH (ref 0.61–1.24)
GFR calc Af Amer: 59 mL/min — ABNORMAL LOW (ref >60)
GFR, EST NON AFRICAN AMERICAN: 51 mL/min — AB (ref >60)
Glucose, Bld: 212 mg/dL — ABNORMAL HIGH (ref 65–99)
POTASSIUM: 4 mmol/L (ref 3.5–5.1)
Sodium: 135 mmol/L (ref 135–145)
Total Bilirubin: 0.4 mg/dL (ref 0.3–1.2)
Total Protein: 6.5 g/dL (ref 6.5–8.1)

## 2016-07-21 LAB — URINALYSIS, COMPLETE (UACMP) WITH MICROSCOPIC
Bacteria, UA: NONE SEEN
Bilirubin Urine: NEGATIVE
GLUCOSE, UA: NEGATIVE mg/dL
Hgb urine dipstick: NEGATIVE
KETONES UR: NEGATIVE mg/dL
LEUKOCYTES UA: NEGATIVE
Nitrite: NEGATIVE
PH: 5 (ref 5.0–8.0)
Protein, ur: NEGATIVE mg/dL
RBC / HPF: NONE SEEN RBC/hpf (ref 0–5)
Specific Gravity, Urine: 1.012 (ref 1.005–1.030)
Squamous Epithelial / LPF: NONE SEEN

## 2016-07-21 LAB — CBC WITH DIFFERENTIAL/PLATELET
BASOS ABS: 0.1 10*3/uL (ref 0–0.1)
Basophils Relative: 1 %
Eosinophils Absolute: 0.2 10*3/uL (ref 0–0.7)
Eosinophils Relative: 2 %
HCT: 32.3 % — ABNORMAL LOW (ref 40.0–52.0)
Hemoglobin: 10.2 g/dL — ABNORMAL LOW (ref 13.0–18.0)
LYMPHS PCT: 16 %
Lymphs Abs: 1.6 10*3/uL (ref 1.0–3.6)
MCH: 26.1 pg (ref 26.0–34.0)
MCHC: 31.6 g/dL — ABNORMAL LOW (ref 32.0–36.0)
MCV: 82.4 fL (ref 80.0–100.0)
MONO ABS: 0.7 10*3/uL (ref 0.2–1.0)
Monocytes Relative: 7 %
Neutro Abs: 7.6 10*3/uL — ABNORMAL HIGH (ref 1.4–6.5)
Neutrophils Relative %: 74 %
PLATELETS: 248 10*3/uL (ref 150–440)
RBC: 3.91 MIL/uL — ABNORMAL LOW (ref 4.40–5.90)
RDW: 17.4 % — AB (ref 11.5–14.5)
WBC: 10.2 10*3/uL (ref 3.8–10.6)

## 2016-07-21 MED ORDER — CYCLOBENZAPRINE HCL 10 MG PO TABS
5.0000 mg | ORAL_TABLET | Freq: Once | ORAL | Status: AC
  Administered 2016-07-21: 5 mg via ORAL
  Filled 2016-07-21: qty 1

## 2016-07-21 MED ORDER — MORPHINE SULFATE (PF) 2 MG/ML IV SOLN: 2.0000 mg | INTRAVENOUS | Status: DC | PRN

## 2016-07-21 MED ORDER — TRAMADOL HCL 50 MG PO TABS: 100.0000 mg | ORAL_TABLET | Freq: Four times a day (QID) | ORAL | 0 refills | Status: DC | PRN

## 2016-07-21 MED ORDER — OXYCODONE-ACETAMINOPHEN 5-325 MG PO TABS
1.0000 | ORAL_TABLET | ORAL | Status: DC | PRN
  Administered 2016-07-21: 1 via ORAL
  Filled 2016-07-21: qty 1

## 2016-07-21 MED ORDER — OXYCODONE HCL 5 MG PO TABS: 5.0000 mg | ORAL_TABLET | ORAL | 0 refills | Status: DC | PRN

## 2016-07-21 NOTE — Clinical Social Work Note (Signed)
Clinical Social Work Assessment  Patient Details  Name: [name] MRN: [id number] Date of Birth: 07/07/1945  Date of referral:  07/21/16               Reason for consult:  Facility Placement                Permission sought to share information with:  Chartered certified accountant granted to share information::  Yes, Verbal Permission Granted  Name::      Media planner::   [place]   Relationship::     Contact Information:     Housing/Transportation Living arrangements for the past 2 months:  Materials engineer, [place] of Information:  Patient, Adult Children Patient Interpreter Needed:  None Criminal Activity/Legal Involvement Pertinent to Current Situation/Hospitalization:  No - Comment as needed Significant Relationships:  Adult Children Lives with:  Self Do you feel safe going back to the place where you live?  No Need for family participation in patient care:  Yes (Comment)  Care giving concerns:  Patient lives in a senior apartment complex alone and has an aide that comes 2-3 hours per day through [facility] cap services.    Social Worker assessment / plan:  Holiday representative (CSW) received SNF consult from the ED. Per MD patient does not require admission to the hospital and can D/C from the ED. Patient had a fall and a knee injury. CSW met with patient at bedside to address consult. Patient's knee was bruised. Patient reported that he lives alone and is not able to care for himself right now due to the knee injury. Patient requested to go to SNF for short term rehab. Per patient he has been to SNF before. Patient's insurance [employer] will allow him to be placed to SNF from the ED. FL2 complete and faxed out. CSW presented bed offers and patient chose [facility]. Patient requested for CSW to call his daughter [name] and make her aware of above.   Patient is medically stable for D/C to [facility] from the ED  today. Per [facility] admissions coordinator at [facility] they can accept patient today to room 404. RN will call report and arrange EMS for transport. CSW sent FL2 to [facility] via [place]. Patient is aware of above. CSW contacted patient's daughter [name] and made her aware of above. [name] is in agreement with the plan. Please reconsult if future social work needs arise. CSW signing off.   Employment status:  Disabled (Comment on whether or not currently receiving Disability) Insurance information:  Managed Medicare PT Recommendations:  Not assessed at this time Information / Referral to community resources:  [place]  Patient/Family's Response to care:  Patient and daughter are agreeable for patient to go to [facility] for rehab.   Patient/Family's Understanding of and Emotional Response to Diagnosis, Current Treatment, and Prognosis:  Patient was very pleasant and thanked CSW for assistance.   Emotional Assessment Appearance:  Appears stated age Attitude/Demeanor/Rapport:    Affect (typically observed):  Accepting, Adaptable, Pleasant Orientation:  Oriented to Self, Oriented to Place, Oriented to  Time, Oriented to Situation Alcohol / Substance use:  Not Applicable Psych involvement (Current and /or in the community):  No (Comment)  Discharge Needs  Concerns to be addressed:  Discharge Planning Concerns Readmission within the last 30 days:  No Current discharge risk:  Dependent with Mobility Barriers to Discharge:  No Barriers Identified   Jakavion Bilodeau, Veronia Beets, LCSW  07/21/2016, 4:59 PM

## 2016-07-21 NOTE — ED Notes (Signed)
Per patient's aide Dr. Westley Gambles sent patient to the ED in order to be sent to a rehab facility due to inability to bear weight on his right leg.

## 2016-07-21 NOTE — NC FL2 (Signed)
[place] LEVEL OF CARE SCREENING TOOL     IDENTIFICATION  Patient Name: [name] Birthdate: 04-25-1946 Sex: male Admission Date (Current Location): 07/21/2016  [facility] and [place] Number:  Selena Lesser  ([id number] [facility]) Facility and Address:  [facility], [address], [place], [place] [address]      Provider Number: [id number]  Attending Physician Name and Address:  Merlyn Lot, MD  Relative Name and Phone Number:       Current Level of Care: Hospital Recommended Level of Care: [place] Prior Approval Number:    Date Approved/Denied:   PASRR Number:  ([id number] )  Discharge Plan: SNF    Current Diagnoses: Patient Active Problem List   Diagnosis Date Noted  . Chest pain 07/14/2016  . UTI (urinary tract infection) 04/27/2016  . CAD (coronary artery disease) 04/27/2016  . Diabetes ([place]) 04/27/2016  . HTN (hypertension) 04/27/2016  . Chronic CHF (congestive heart failure) ([place]) 04/27/2016  . CKD (chronic kidney disease), stage III 04/27/2016  . Chest pain, rule out acute myocardial infarction 02/16/2016    Orientation RESPIRATION BLADDER Height & Weight     Self, Time, Situation, Place  Normal Continent Weight: (!) 360 lb (163.3 kg) Height:  5\' 7"  (170.2 cm)  BEHAVIORAL SYMPTOMS/MOOD NEUROLOGICAL BOWEL NUTRITION STATUS   (none)  (none) Continent Diet (Regular Diet )  AMBULATORY STATUS COMMUNICATION OF NEEDS Skin   Extensive Assist Verbally Normal                       Personal Care Assistance Level of Assistance  Bathing, Feeding, Dressing Bathing Assistance: Limited assistance Feeding assistance: Independent Dressing Assistance: Limited assistance     Functional Limitations Info  Sight, Hearing, Speech Sight Info: Adequate Hearing Info: Adequate Speech Info: Adequate    SPECIAL CARE FACTORS FREQUENCY  PT (By licensed PT), OT (By licensed OT)     PT Frequency:  (5) OT Frequency:   (5)            Contractures      Additional Factors Info  Code Status, Allergies Code Status Info:  (Full Code. ) Allergies Info:  (Cephalosporins, Dilaudid Hydromorphone Hcl, Hydrocodone-acetaminophen, Morphine And Related, Tetracyclines & Related, Penicillins, Sulfa Antibiotics)           Current Medications (07/21/2016):  This is the current hospital active medication list No current facility-administered medications for this encounter.    Current Outpatient Prescriptions  Medication Sig Dispense Refill  . amLODipine (NORVASC) 10 MG tablet Take 10 mg by mouth daily.     . ARIPiprazole (ABILIFY) 5 MG tablet Take 5 mg by mouth daily.     [name] aspirin EC 81 MG tablet Take 81 mg by mouth daily.    [name] atorvastatin (LIPITOR) 80 MG tablet Take 80 mg by mouth every evening.    . BELSOMRA 10 MG TABS Take 10 mg by mouth at bedtime.     [name] buPROPion (WELLBUTRIN SR) 100 MG 12 hr tablet Take 100 mg by mouth daily.     . celecoxib (CELEBREX) 200 MG capsule Take 1 capsule by mouth daily.     . cetirizine (ZYRTEC) 10 MG tablet Take 10 mg by mouth daily.    . Cholecalciferol (VITAMIN D3) 50000 units CAPS Take 50,000 Units by mouth every 7 (seven) days.    . clopidogrel (PLAVIX) 75 MG tablet Take 75 mg by mouth once.     . docusate sodium (COLACE) 100 MG capsule Take  100 mg by mouth 3 (three) times daily.    . furosemide (LASIX) 20 MG tablet Take 20 mg by mouth 2 (two) times daily.     [name] gabapentin (NEURONTIN) 400 MG capsule Take 400 mg by mouth 3 (three) times daily.     . hydrALAZINE (APRESOLINE) 25 MG tablet Take 25 mg by mouth 3 (three) times daily.     [name] ipratropium-albuterol (DUONEB) 0.5-2.5 (3) MG/3ML SOLN Inhale 3 mLs into the lungs every 4 (four) hours as needed.    . isosorbide mononitrate (IMDUR) 60 MG 24 hr tablet Take 60 mg by mouth daily.     [name] levothyroxine (SYNTHROID, LEVOTHROID) 100 MCG tablet Take 100 mcg by mouth daily before breakfast.     . magnesium oxide (MAG-OX) 400 MG  tablet Take 400 mg by mouth daily.    . meclizine (ANTIVERT) 25 MG tablet Take 25 mg by mouth 2 (two) times daily as needed.     . metoprolol (LOPRESSOR) 50 MG tablet Take 50 mg by mouth 2 (two) times daily.     . montelukast (SINGULAIR) 10 MG tablet Take 1 tablet by mouth at bedtime.     . pantoprazole (PROTONIX) 40 MG tablet Take 1 tablet by mouth daily.    . Polysaccharide Iron Complex (FERREX 150 PO) Take 1 capsule by mouth daily.    [name] PROAIR HFA 108 (90 Base) MCG/ACT inhaler Inhale 2 puffs into the lungs every 4 (four) hours as needed.    . traMADol (ULTRAM) 50 MG tablet Take 100 mg by mouth 4 (four) times daily.     . ziprasidone (GEODON) 60 MG capsule Take 60 mg by mouth 2 (two) times daily with a meal.     . ADVAIR DISKUS 100-50 MCG/DOSE AEPB Inhale 1 puff into the lungs 2 (two) times daily.     [name] ibuprofen (ADVIL,MOTRIN) 600 MG tablet Take 1 tablet (600 mg total) by mouth every 8 (eight) hours as needed. 30 tablet 0  . oxyCODONE (ROXICODONE) 5 MG immediate release tablet Take 1 tablet (5 mg total) by mouth every 4 (four) hours as needed for severe pain. 7 tablet 0     Discharge Medications: Please see discharge summary for a list of discharge medications.  Relevant Imaging Results:  Relevant Lab Results:   Additional Information  (SSN: [id number])  Tanganika Barradas, Veronia Beets, LCSW

## 2016-07-21 NOTE — Clinical Social Work Placement (Signed)
   CLINICAL SOCIAL WORK PLACEMENT  NOTE  Date:  07/21/2016  Patient Details  Name: [name] MRN: [id number] Date of Birth: 05-Nov-1945  Clinical Social Work is seeking post-discharge placement for this patient at the [place] level of care (*CSW will initial, date and re-position this form in  chart as items are completed):  Yes   Patient/family provided with [place] Work Department's list of facilities offering this level of care within the geographic area requested by the patient (or if unable, by the patient's family).  Yes   Patient/family informed of their freedom to choose among providers that offer the needed level of care, that participate in Medicare, Medicaid or managed care program needed by the patient, have an available bed and are willing to accept the patient.  Yes   Patient/family informed of [place]'s ownership interest in [facility] and [facility], as well as of the fact that they are under no obligation to receive care at these facilities.  PASRR submitted to EDS on       PASRR number received on       Existing PASRR number confirmed on 07/21/16     FL2 transmitted to all facilities in geographic area requested by pt/family on 07/21/16     FL2 transmitted to all facilities within larger geographic area on       Patient informed that his/her managed care company has contracts with or will negotiate with certain facilities, including the following:        Yes   Patient/family informed of bed offers received.  Patient chooses bed at  ([place] (SNF) )     Physician recommends and patient chooses bed at      Patient to be transferred to  ([place] (SNF) ) on 07/21/16.  Patient to be transferred to facility by  [facility] )     Patient family notified on 07/21/16 of transfer.  Name of family member notified:   (Patient's daughter [name] is aware of D/C today. )     PHYSICIAN        Additional Comment:    _______________________________________________ Sarh Kirschenbaum, Veronia Beets, LCSW 07/21/2016, 4:58 PM

## 2016-07-21 NOTE — ED Triage Notes (Signed)
[facility] ED [facility]  Patient reports falling twice last week due to weakness and reports falling yesterday evening and then again today. Patient was seen in ED last night for right knee pain and patient is now also complaining of right ankle pain.  Patient denies hitting his head yesterday or today and denies losing consciousness.  Patient takes blood thinners.  Patient is in no obvious distress at this time.  Has bruising to both arms but denies pain.

## 2016-07-21 NOTE — ED Notes (Signed)
EMS arrived to transport patient to [facility]. Report was given to staff at facility by previous shift nurse Vicente Males. Transferred without incident. EMS did leave patients personal belongs, recognized when we was cleaning room. Facility notified of belongings left.

## 2016-07-21 NOTE — ED Provider Notes (Signed)
[facility] Emergency Department Provider Note    First MD Initiated Contact with Patient 07/21/16 1420     (approximate)  I have reviewed the triage vital signs and the nursing notes.   HISTORY  Chief Complaint Fall    HPI [name] is a 71 y.o. male with multiple comorbidities living at a long-term independent living facility and presents at the direction of his primary care physician due to recurrent falls. Patient seen here last night due to fall from standing with right knee pain. Had evidence of no fracture but probable hematoma on the right knee. Patient had another fall today subsequently injuring lower part of his right leg as well as left humerus. He is on aspirin and Plavix for history of CABG. Denies any chest pain or shortness of breath. States that he was actually going with his age to look at an apartment to live in.  He called his PCP after the fall who told him to come to the ER to be placed in rehabilitation.   Past Medical History:  Diagnosis Date  . Arthritis   . CHF (congestive heart failure) ([place])   . Diabetes ([place])   . Heart trouble   . History of stomach ulcers   . Hypertension   . S/P CABG x 2   . Skin cancer   . Stroke [facility])    Family History  Problem Relation Age of Onset  . Diabetes Mother   . Heart attack Father   . Diabetes Brother   . Heart attack Brother    Past Surgical History:  Procedure Laterality Date  . CARDIAC SURGERY    . CHOLECYSTECTOMY    . GALLBLADDER SURGERY    . HEART BYPASS    . HERNIA REPAIR    . KIDNEY SURGERY     Patient Active Problem List   Diagnosis Date Noted  . Chest pain 07/14/2016  . UTI (urinary tract infection) 04/27/2016  . CAD (coronary artery disease) 04/27/2016  . Diabetes ([place]) 04/27/2016  . HTN (hypertension) 04/27/2016  . Chronic CHF (congestive heart failure) ([place]) 04/27/2016  . CKD (chronic kidney disease), stage III 04/27/2016  . Chest pain, rule out acute  myocardial infarction 02/16/2016      Prior to Admission medications   Medication Sig Start Date End Date Taking? Authorizing Provider  amLODipine (NORVASC) 10 MG tablet Take 10 mg by mouth daily.  05/31/13  Yes Historical Provider, MD  ARIPiprazole (ABILIFY) 5 MG tablet Take 5 mg by mouth daily.  05/31/13  Yes Historical Provider, MD  aspirin EC 81 MG tablet Take 81 mg by mouth daily.   Yes Historical Provider, MD  atorvastatin (LIPITOR) 80 MG tablet Take 80 mg by mouth every evening.   Yes Historical Provider, MD  BELSOMRA 10 MG TABS Take 10 mg by mouth at bedtime.  04/06/16  Yes Historical Provider, MD  buPROPion (WELLBUTRIN SR) 100 MG 12 hr tablet Take 100 mg by mouth daily.  05/31/13  Yes Historical Provider, MD  celecoxib (CELEBREX) 200 MG capsule Take 1 capsule by mouth daily.  05/21/16  Yes Historical Provider, MD  clopidogrel (PLAVIX) 75 MG tablet Take 75 mg by mouth once.  05/31/13  Yes Historical Provider, MD  furosemide (LASIX) 20 MG tablet Take 20 mg by mouth 2 (two) times daily.  05/31/13  Yes Historical Provider, MD  gabapentin (NEURONTIN) 400 MG capsule Take 400 mg by mouth 3 (three) times daily.  05/31/13  Yes Historical Provider,  MD  hydrALAZINE (APRESOLINE) 25 MG tablet Take 25 mg by mouth 3 (three) times daily.    Yes Historical Provider, MD  ipratropium-albuterol (DUONEB) 0.5-2.5 (3) MG/3ML SOLN Inhale 3 mLs into the lungs every 4 (four) hours as needed. 05/01/16  Yes Historical Provider, MD  isosorbide mononitrate (IMDUR) 60 MG 24 hr tablet Take 60 mg by mouth daily.  05/31/13  Yes Historical Provider, MD  PROAIR HFA 108 (90 Base) MCG/ACT inhaler Inhale 2 puffs into the lungs every 4 (four) hours as needed. 05/01/16  Yes Historical Provider, MD  traMADol (ULTRAM) 50 MG tablet Take 100 mg by mouth 4 (four) times daily.  06/06/13  Yes Historical Provider, MD  ADVAIR DISKUS 100-50 MCG/DOSE AEPB Inhale 1 puff into the lungs 2 (two) times daily.  05/31/13   Historical Provider, MD  ibuprofen  (ADVIL,MOTRIN) 600 MG tablet Take 1 tablet (600 mg total) by mouth every 8 (eight) hours as needed. 07/20/16   Darel Hong, MD  levothyroxine (SYNTHROID, LEVOTHROID) 100 MCG tablet Take 100 mcg by mouth daily before breakfast.  05/31/13   Historical Provider, MD  LORazepam (ATIVAN) 2 MG tablet Take 2 mg by mouth at bedtime.    Historical Provider, MD  meclizine (ANTIVERT) 25 MG tablet Take 25 mg by mouth 2 (two) times daily as needed.  05/31/13   Historical Provider, MD  metoprolol (LOPRESSOR) 50 MG tablet Take 50 mg by mouth daily.  05/31/13   Historical Provider, MD  montelukast (SINGULAIR) 10 MG tablet Take 1 tablet by mouth at bedtime.     Historical Provider, MD  oxyCODONE (ROXICODONE) 5 MG immediate release tablet Take 1 tablet (5 mg total) by mouth every 4 (four) hours as needed for severe pain. 07/20/16   Darel Hong, MD  pantoprazole (PROTONIX) 40 MG tablet Take 1 tablet by mouth daily.    Historical Provider, MD  ramipril (ALTACE) 10 MG capsule Take 10 mg by mouth daily.  05/31/13   Historical Provider, MD  ranolazine (RANEXA) 500 MG 12 hr tablet Take 1,000 mg by mouth 2 (two) times daily.  05/31/13   Historical Provider, MD  ziprasidone (GEODON) 60 MG capsule Take 60 mg by mouth 2 (two) times daily with a meal.  05/31/13   Historical Provider, MD    Allergies Cephalosporins; Dilaudid [hydromorphone hcl]; Hydrocodone-acetaminophen; Morphine and related; Tetracyclines & related; Penicillins; and Sulfa antibiotics    Social History Social History  Substance Use Topics  . Smoking status: Former Smoker    Quit date: 03/15/1963  . Smokeless tobacco: Never Used  . Alcohol use No    Review of Systems Patient denies headaches, rhinorrhea, blurry vision, numbness, shortness of breath, chest pain, edema, cough, abdominal pain, nausea, vomiting, diarrhea, dysuria, fevers, rashes or hallucinations unless otherwise stated above in HPI. ____________________________________________   PHYSICAL  EXAM:  VITAL SIGNS: Vitals:   07/21/16 1417  BP: 135/65  Pulse: 65  Resp: 18  Temp: 98.7 F (37.1 C)    Constitutional: Alert and oriented. Morbidly obese, NAD Eyes: Conjunctivae are normal. PERRL. EOMI. Head: Atraumatic. Nose: No congestion/rhinnorhea. Mouth/Throat: Mucous membranes are moist.  Oropharynx non-erythematous. Neck: No stridor. Painless ROM. No cervical spine tenderness to palpation Hematological/Lymphatic/Immunilogical: No cervical lymphadenopathy. Cardiovascular: Normal rate, regular rhythm. Grossly normal heart sounds.  Good peripheral circulation. Respiratory: Normal respiratory effort.  No retractions. Lungs CTAB. Gastrointestinal: Soft and nontender. No distention. No abdominal bruits. No CVA tenderness. Musculoskeletal: ttp of right knee and shin, + right knee effusion without induration, warmth or  cellulitis, left distal arm contusion, Painless ROM. Neurologic:  Normal speech and language. No gross focal neurologic deficits are appreciated. No gait instability. Skin:  Skin is warm, dry and intact. No rash noted. Psychiatric: Mood and affect are normal. Speech and behavior are normal.  ____________________________________________   LABS (all labs ordered are listed, but only abnormal results are displayed)  Results for orders placed or performed during the hospital encounter of 07/21/16 (from the past 24 hour(s))  CBC with Differential/Platelet     Status: Abnormal   Collection Time: 07/21/16  4:07 PM  Result Value Ref Range   WBC 10.2 3.8 - 10.6 K/uL   RBC 3.91 (L) 4.40 - 5.90 MIL/uL   Hemoglobin 10.2 (L) 13.0 - 18.0 g/dL   HCT 32.3 (L) 40.0 - 52.0 %   MCV 82.4 80.0 - 100.0 fL   MCH 26.1 26.0 - 34.0 pg   MCHC 31.6 (L) 32.0 - 36.0 g/dL   RDW 17.4 (H) 11.5 - 14.5 %   Platelets 248 150 - 440 K/uL   Neutrophils Relative % 74 %   Neutro Abs 7.6 (H) 1.4 - 6.5 K/uL   Lymphocytes Relative 16 %   Lymphs Abs 1.6 1.0 - 3.6 K/uL   Monocytes Relative 7 %    Monocytes Absolute 0.7 0.2 - 1.0 K/uL   Eosinophils Relative 2 %   Eosinophils Absolute 0.2 0 - 0.7 K/uL   Basophils Relative 1 %   Basophils Absolute 0.1 0 - 0.1 K/uL  Comprehensive metabolic panel     Status: Abnormal   Collection Time: 07/21/16  4:07 PM  Result Value Ref Range   Sodium 135 135 - 145 mmol/L   Potassium 4.0 3.5 - 5.1 mmol/L   Chloride 104 101 - 111 mmol/L   CO2 24 22 - 32 mmol/L   Glucose, Bld 212 (H) 65 - 99 mg/dL   BUN 20 6 - 20 mg/dL   Creatinine, Ser 1.35 (H) 0.61 - 1.24 mg/dL   Calcium 8.2 (L) 8.9 - 10.3 mg/dL   Total Protein 6.5 6.5 - 8.1 g/dL   Albumin 3.2 (L) 3.5 - 5.0 g/dL   AST 27 15 - 41 U/L   ALT 21 17 - 63 U/L   Alkaline Phosphatase 72 38 - 126 U/L   Total Bilirubin 0.4 0.3 - 1.2 mg/dL   GFR calc non Af Amer 51 (L) >60 mL/min   GFR calc Af Amer 59 (L) >60 mL/min   Anion gap 7 5 - 15  Urinalysis, Complete w Microscopic     Status: Abnormal   Collection Time: 07/21/16  6:24 PM  Result Value Ref Range   Color, Urine YELLOW (A) YELLOW   APPearance CLEAR (A) CLEAR   Specific Gravity, Urine 1.012 1.005 - 1.030   pH 5.0 5.0 - 8.0   Glucose, UA NEGATIVE NEGATIVE mg/dL   Hgb urine dipstick NEGATIVE NEGATIVE   Bilirubin Urine NEGATIVE NEGATIVE   Ketones, ur NEGATIVE NEGATIVE mg/dL   Protein, ur NEGATIVE NEGATIVE mg/dL   Nitrite NEGATIVE NEGATIVE   Leukocytes, UA NEGATIVE NEGATIVE   RBC / HPF NONE SEEN 0 - 5 RBC/hpf   WBC, UA 0-5 0 - 5 WBC/hpf   Bacteria, UA NONE SEEN NONE SEEN   Squamous Epithelial / LPF NONE SEEN NONE SEEN   Mucous PRESENT    Hyaline Casts, UA PRESENT    ____________________________________________  EKG My review and personal interpretation at Time: 14:20   Indication: weakness  Rate: 65  Rhythm:  sinus Axis: normal Other: non specific st changes, normal intervals ____________________________________________  RADIOLOGY  I personally reviewed all radiographic images ordered to evaluate for the above acute complaints and  reviewed radiology reports and findings.  These findings were personally discussed with the patient.  Please see medical record for radiology report.  ____________________________________________   PROCEDURES  Procedure(s) performed:  Procedures    Critical Care performed: no ____________________________________________   INITIAL IMPRESSION / ASSESSMENT AND PLAN / ED COURSE  Pertinent labs & imaging results that were available during my care of the patient were reviewed by me and considered in my medical decision making (see chart for details).  [id number], contusion,   READ SCHARR is a 71 y.o. who presents to the ED with evaluation for fall. Patient seen last night with negative x-ray. Will extend x-rays to further evaluate for new areas of pain as listed above. We'll check blood work to evaluate for any evidence of dehydration or underlying process that could explain the patient's frequent falls. Clinically I do suspect this is largely due to deconditioning and worsening obesity however patient with multiple chronic comorbidities. He is afebrile and otherwise hemodynamically stable.  The patient will be placed on continuous pulse oximetry and telemetry for monitoring.  Laboratory evaluation will be sent to evaluate for the above complaints.     Clinical Course as of Jul 22 16  Tue Jul 21, 2016  1624 XR with eidence of probable new right fibular neck fracture.  Will place in splint.  [PR]  [id number] Blood work is reassuring. Patient hemodynamic stable. Social work was able to arrange transfer placement in rehabilitation today. Developed is very much appreciated. Patient will be provided referral for orthopedic follow-up.  Have discussed with the patient and available family all diagnostics and treatments performed thus far and all questions were answered to the best of my ability. The patient demonstrates understanding and agreement with plan.   [PR]  1649 Patient already on DVT  prophylaxis with high-dose aspirin.  Have discussed with the patient and available family all diagnostics and treatments performed thus far and all questions were answered to the best of my ability. The patient demonstrates understanding and agreement with plan.   [PR]    Clinical Course User Index [PR] Merlyn Lot, MD     ____________________________________________   FINAL CLINICAL IMPRESSION(S) / ED DIAGNOSES  Final diagnoses:  Fall, initial encounter  Other closed fracture of proximal end of right fibula, initial encounter  Abrasion      NEW MEDICATIONS STARTED DURING THIS VISIT:  New Prescriptions   No medications on file     Note:  This document was prepared using Dragon voice recognition software and may include unintentional dictation errors.    Merlyn Lot, MD 07/22/16 [phone]

## 2016-07-23 DIAGNOSIS — M8000XD Age-related osteoporosis with current pathological fracture, unspecified site, subsequent encounter for fracture with routine healing: Secondary | ICD-10-CM | POA: Insufficient documentation

## 2016-07-23 DIAGNOSIS — J42 Unspecified chronic bronchitis: Secondary | ICD-10-CM | POA: Insufficient documentation

## 2016-07-23 DIAGNOSIS — Z8673 Personal history of transient ischemic attack (TIA), and cerebral infarction without residual deficits: Secondary | ICD-10-CM | POA: Insufficient documentation

## 2016-07-23 DIAGNOSIS — F319 Bipolar disorder, unspecified: Secondary | ICD-10-CM

## 2016-07-23 HISTORY — DX: Personal history of transient ischemic attack (TIA), and cerebral infarction without residual deficits: Z86.73

## 2016-07-23 HISTORY — DX: Bipolar disorder, unspecified: F31.9

## 2016-07-23 HISTORY — DX: Unspecified chronic bronchitis: J42

## 2016-08-14 ENCOUNTER — Encounter: Payer: Medicare Other | Attending: Surgery | Admitting: Surgery

## 2016-08-14 DIAGNOSIS — Z8249 Family history of ischemic heart disease and other diseases of the circulatory system: Secondary | ICD-10-CM | POA: Diagnosis not present

## 2016-08-14 DIAGNOSIS — K219 Gastro-esophageal reflux disease without esophagitis: Secondary | ICD-10-CM | POA: Diagnosis not present

## 2016-08-14 DIAGNOSIS — M12561 Traumatic arthropathy, right knee: Secondary | ICD-10-CM | POA: Insufficient documentation

## 2016-08-14 DIAGNOSIS — Z880 Allergy status to penicillin: Secondary | ICD-10-CM | POA: Insufficient documentation

## 2016-08-14 DIAGNOSIS — Z85828 Personal history of other malignant neoplasm of skin: Secondary | ICD-10-CM | POA: Diagnosis not present

## 2016-08-14 DIAGNOSIS — D649 Anemia, unspecified: Secondary | ICD-10-CM | POA: Diagnosis not present

## 2016-08-14 DIAGNOSIS — I509 Heart failure, unspecified: Secondary | ICD-10-CM | POA: Insufficient documentation

## 2016-08-14 DIAGNOSIS — I2510 Atherosclerotic heart disease of native coronary artery without angina pectoris: Secondary | ICD-10-CM | POA: Insufficient documentation

## 2016-08-14 DIAGNOSIS — Z882 Allergy status to sulfonamides status: Secondary | ICD-10-CM | POA: Insufficient documentation

## 2016-08-14 DIAGNOSIS — Z87891 Personal history of nicotine dependence: Secondary | ICD-10-CM | POA: Diagnosis not present

## 2016-08-14 DIAGNOSIS — Z885 Allergy status to narcotic agent status: Secondary | ICD-10-CM | POA: Insufficient documentation

## 2016-08-14 DIAGNOSIS — E11622 Type 2 diabetes mellitus with other skin ulcer: Secondary | ICD-10-CM | POA: Diagnosis not present

## 2016-08-14 DIAGNOSIS — L97812 Non-pressure chronic ulcer of other part of right lower leg with fat layer exposed: Secondary | ICD-10-CM | POA: Diagnosis not present

## 2016-08-14 DIAGNOSIS — Z833 Family history of diabetes mellitus: Secondary | ICD-10-CM | POA: Insufficient documentation

## 2016-08-14 DIAGNOSIS — J449 Chronic obstructive pulmonary disease, unspecified: Secondary | ICD-10-CM | POA: Insufficient documentation

## 2016-08-14 DIAGNOSIS — M70861 Other soft tissue disorders related to use, overuse and pressure, right lower leg: Secondary | ICD-10-CM | POA: Diagnosis not present

## 2016-08-14 DIAGNOSIS — I110 Hypertensive heart disease with heart failure: Secondary | ICD-10-CM | POA: Diagnosis not present

## 2016-08-14 DIAGNOSIS — L89892 Pressure ulcer of other site, stage 2: Secondary | ICD-10-CM | POA: Insufficient documentation

## 2016-08-14 DIAGNOSIS — Z8673 Personal history of transient ischemic attack (TIA), and cerebral infarction without residual deficits: Secondary | ICD-10-CM | POA: Diagnosis not present

## 2016-08-14 DIAGNOSIS — Z951 Presence of aortocoronary bypass graft: Secondary | ICD-10-CM | POA: Insufficient documentation

## 2016-08-14 DIAGNOSIS — E7800 Pure hypercholesterolemia, unspecified: Secondary | ICD-10-CM | POA: Diagnosis not present

## 2016-08-14 DIAGNOSIS — Z888 Allergy status to other drugs, medicaments and biological substances status: Secondary | ICD-10-CM | POA: Insufficient documentation

## 2016-08-14 NOTE — Progress Notes (Addendum)
EZRAH, PANNING ([id number]) Visit Report for 08/14/2016 Allergy List Details Patient Name: [name], [name]. Date of Service: 08/14/2016 9:00 AM Medical Record Number: [id number] Patient Account Number: [id number] Date of Birth/Sex: 06/23/1945 (71 y.o. Male) Treating RN: Baruch Gouty, RN, BSN, Velva Harman Primary Care Caidyn Blossom: [name] Dupre Other Clinician: Referring Merion Caton: Frazier Richards Treating Samarie Pinder/Extender: Frann Rider in Treatment: 0 Allergies Active Allergies Cephalosporins Dilaudid hydrocodone morphine PCN SUlfa teteracyclines Allergy Notes Electronic Signature(s) Signed: 08/14/2016 9:23:12 AM By: Regan Lemming BSN, RN Previous Signature: 08/14/2016 9:22:13 AM Version By: Regan Lemming BSN, RN Entered By: Regan Lemming on 08/14/2016 09:23:12 [name] ([id number]) -------------------------------------------------------------------------------- Arrival Information Details Patient Name: [name]. Date of Service: 08/14/2016 9:00 AM Medical Record Number: [id number] Patient Account Number: [id number] Date of Birth/Sex: [birth year] (71 y.o. Male) Treating RN: Baruch Gouty, RN, BSN, Velva Harman Primary Care Tajana Crotteau: [name] Dupre Other Clinician: Referring Illyana Schorsch: Frazier Richards Treating Torrion Witter/Extender: Frann Rider in Treatment: 0 Visit Information Patient Arrived: Wheel Chair Arrival Time: 09:28 Accompanied By: caregiver Transfer Assistance: None Patient Identification Verified: Yes Secondary Verification Process Yes Completed: Patient Requires Transmission-Based No Precautions: Patient Has Alerts: No Electronic Signature(s) Signed: 08/14/2016 1:23:55 PM By: Regan Lemming BSN, RN Entered By: Regan Lemming on 08/14/2016 09:28:55 [name] ([id number]) -------------------------------------------------------------------------------- Clinic Level of Care Assessment Details Patient Name: [name]. Date of Service: 08/14/2016 9:00  AM Medical Record Number: [id number] Patient Account Number: [id number] Date of Birth/Sex: [birth year] (71 y.o. Male) Treating RN: Baruch Gouty, RN, BSN, Velva Harman Primary Care Sherrian Nunnelley: [name] Dupre Other Clinician: Referring Dakayla Disanti: Frazier Richards Treating Tika Hannis/Extender: Frann Rider in Treatment: 0 Clinic Level of Care Assessment Items TOOL 2 Quantity Score []  - Use when only an EandM is performed on the INITIAL visit 0 ASSESSMENTS - Nursing Assessment / Reassessment X - General Physical Exam (combine w/ comprehensive assessment (listed just 1 20 below) when performed on new pt. evals) X - Comprehensive Assessment (HX, ROS, Risk Assessments, Wounds Hx, etc.) 1 25 ASSESSMENTS - Wound and Skin Assessment / Reassessment []  - Simple Wound Assessment / Reassessment - one wound 0 X - Complex Wound Assessment / Reassessment - multiple wounds 3 5 []  - Dermatologic / Skin Assessment (not related to wound area) 0 ASSESSMENTS - Ostomy and/or Continence Assessment and Care []  - Incontinence Assessment and Management 0 []  - Ostomy Care Assessment and Management (repouching, etc.) 0 PROCESS - Coordination of Care X - Simple Patient / Family Education for ongoing care 1 15 []  - Complex (extensive) Patient / Family Education for ongoing care 0 X - Staff obtains Programmer, systems, Records, Test Results / Process Orders 1 10 X - Staff telephones HHA, Nursing Homes / Clarify orders / etc 1 10 []  - Routine Transfer to another Facility (non-emergent condition) 0 []  - Routine Hospital Admission (non-emergent condition) 0 X - New Admissions / Biomedical engineer / Ordering NPWT, Apligraf, etc. 1 15 []  - Emergency Hospital Admission (emergent condition) 0 []  - Simple Discharge Coordination 0 [name], [name] ([id number]) []  - Complex (extensive) Discharge Coordination 0 PROCESS - Special Needs []  - Pediatric / Minor Patient Management 0 []  - Isolation Patient Management 0 []  - Hearing / Language  / Visual special needs 0 []  - Assessment of Community assistance (transportation, D/C planning, etc.) 0 []  - Additional assistance / Altered mentation 0 []  - Support Surface(s) Assessment (bed, cushion, seat, etc.) 0 INTERVENTIONS - Wound Cleansing / Measurement X - Wound Imaging (photographs - any number of wounds) 1 5 []  - Wound Tracing (  instead of photographs) 0 []  - Simple Wound Measurement - one wound 0 X - Complex Wound Measurement - multiple wounds 3 5 []  - Simple Wound Cleansing - one wound 0 X - Complex Wound Cleansing - multiple wounds 3 5 INTERVENTIONS - Wound Dressings X - Small Wound Dressing one or multiple wounds 3 10 []  - Medium Wound Dressing one or multiple wounds 0 []  - Large Wound Dressing one or multiple wounds 0 []  - Application of Medications - injection 0 INTERVENTIONS - Miscellaneous []  - External ear exam 0 []  - Specimen Collection (cultures, biopsies, blood, body fluids, etc.) 0 []  - Specimen(s) / Culture(s) sent or taken to Lab for analysis 0 []  - Patient Transfer (multiple staff / Civil Service fast streamer / Similar devices) 0 []  - Simple Staple / Suture removal (25 or less) 0 []  - Complex Staple / Suture removal (26 or more) 0 [name], [name] ([id number]) []  - Hypo / Hyperglycemic Management (close monitor of Blood Glucose) 0 []  - Ankle / Brachial Index (ABI) - do not check if billed separately 0 Has the patient been seen at the hospital within the last three years: Yes Total Score: 175 Level Of Care: New/Established - Level 5 Electronic Signature(s) Signed: 08/14/2016 1:23:55 PM By: Regan Lemming BSN, RN Entered By: Regan Lemming on 08/14/2016 11:11:12 Bonebrake, [name] ([id number]) -------------------------------------------------------------------------------- Encounter Discharge Information Details Patient Name: [name] Gathers T. Date of Service: 08/14/2016 9:00 AM Medical Record Number: [id number] Patient Account Number: [id number] Date of Birth/Sex: [birth year] (71  y.o. Male) Treating RN: Baruch Gouty, RN, BSN, Velva Harman Primary Care Qaadir Kent: [name] Dupre Other Clinician: Referring Ascher Schroepfer: Frazier Richards Treating Khyler Urda/Extender: Frann Rider in Treatment: 0 Encounter Discharge Information Items Discharge Pain Level: 0 Discharge Condition: Stable Ambulatory Status: Wheelchair Discharge Destination: Nursing Home Transportation: Other Accompanied By: self Schedule Follow-up Appointment: No Medication Reconciliation completed and provided to Patient/Care No Ketan Renz: Provided on Clinical Summary of Care: 08/14/2016 Form Type Recipient Paper Patient JE Electronic Signature(s) Signed: 08/14/2016 1:23:55 PM By: Regan Lemming BSN, RN Previous Signature: 08/14/2016 10:29:52 AM Version By: Ruthine Dose Entered By: Regan Lemming on 08/14/2016 11:09:45 [name], [name] ([id number]) -------------------------------------------------------------------------------- Lower Extremity Assessment Details Patient Name: [name] Gathers T. Date of Service: 08/14/2016 9:00 AM Medical Record Number: [id number] Patient Account Number: [id number] Date of Birth/Sex: [birth year] (71 y.o. Male) Treating RN: Afful, RN, BSN, [place] Primary Care Azlynn Mitnick: [name] Dupre Other Clinician: Referring Arlyn Buerkle: Frazier Richards Treating Jeanita Carneiro/Extender: Frann Rider in Treatment: 0 Edema Assessment Assessed: Shirlyn Goltz: No] [Right: No] Edema: [Left: Yes] [Right: Yes] Calf Left: Right: Point of Measurement: 32 cm From Medial Instep 47 cm 49 cm Ankle Left: Right: Point of Measurement: 8 cm From Medial Instep 31.5 cm 31 cm Vascular Assessment Pulses: Dorsalis Pedis Palpable: [Left:Yes] [Right:Yes] Doppler Audible: [Left:Yes] [Right:Yes] Posterior Tibial Extremity colors, hair growth, and conditions: Extremity Color: [Left:Mottled] [Right:Mottled] Hair Growth on Extremity: [Left:No] [Right:No] Temperature of Extremity: [Left:Warm] [Right:Warm] Capillary  Refill: [Left:< 3 seconds] [Right:< 3 seconds] Toe Nail Assessment Left: Right: Thick: Yes Yes Discolored: Yes Yes Deformed: No No Improper Length and Hygiene: Yes Yes Electronic Signature(s) Signed: 08/14/2016 1:23:55 PM By: Regan Lemming BSN, RN Entered By: Regan Lemming on 08/14/2016 09:51:03 [name], [name] ([id number]) [name], [name] ([id number]) -------------------------------------------------------------------------------- Multi Wound Chart Details Patient Name: [name] Gathers T. Date of Service: 08/14/2016 9:00 AM Medical Record Number: [id number] Patient Account Number: [id number] Date of Birth/Sex: [birth year] (71 y.o. Male) Treating RN: Baruch Gouty, RN, BSN, Velva Harman Primary Care Sheniah Supak: [name] Dupre Other Clinician: Referring  Emmerich Cryer: Frazier Richards Treating Charleton Deyoung/Extender: Frann Rider in Treatment: 0 Vital Signs Height(in): 67 Pulse(bpm): 72 Weight(lbs): 357 Blood Pressure 140/47 (mmHg): Body Mass Index(BMI): 56 Temperature(F): 97.8 Respiratory Rate 20 (breaths/min): Photos: [1:No Photos] [2:No Photos] [3:No Photos] Wound Location: [1:Right Knee - Anterior] [2:Right Knee - Medial] [3:Right Knee - Lateral] Wounding Event: [1:Pressure Injury] [2:Pressure Injury] [3:Pressure Injury] 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] [2:the Lower Extremity] [3:the Lower Extremity] Comorbid History: [1:Anemia, Asthma, Arrhythmia, Congestive Arrhythmia, Congestive Arrhythmia, Congestive Heart Failure, Coronary Heart Failure, Coronary Heart Failure, Coronary Artery Disease, Hypertension, Type II Diabetes, Osteoarthritis Diabetes,  Osteoarthritis Diabetes, Osteoarthritis] [2:Anemia, Asthma, Artery Disease, Hypertension, Type II] [3:Anemia, Asthma, Artery Disease, Hypertension, Type II] Date Acquired: [1:07/23/2016] [2:07/23/2016] [3:07/23/2016] Weeks of Treatment: [1:0] [2:0] [3:0] Wound Status:  [1:Open] [2:Open] [3:Open] Measurements L x W x D 2.5x4x0.1 [2:2x2x0.1] [3:2.2x3.3x0.1] (cm) Area (cm) : [1:7.854] [2:3.142] [3:5.702] Volume (cm) : [1:0.785] [2:0.314] [3:0.57] Classification: [1:Unable to visualize wound Unable to visualize wound Grade 1 bed] [2:bed] Exudate Amount: [1:Large] [2:Large] [3:Large] Exudate Type: [1:Serosanguineous] [2:Serosanguineous] [3:Serosanguineous] Exudate Color: [1:red, brown] [2:red, brown] [3:red, brown] Wound Margin: [1:Flat and Intact] [2:Flat and Intact] [3:Distinct, outline attached] Granulation Amount: [1:None Present (0%)] [2:None Present (0%)] [3:Medium (34-66%)] Granulation Quality: [1:N/A] [2:N/A] [3:Pink, Pale] Necrotic Amount: [1:Large (67-100%)] [2:Large (67-100%)] [3:Small (1-33%)] Necrotic Tissue: [1:Eschar, Adherent Slough Eschar, Adherent Slough Adherent Slough] Exposed Structures: [1:Fat Layer (Subcutaneous Fat Layer (Subcutaneous Fat Layer (Subcutaneous Tissue) Exposed: Yes] [2:Tissue) Exposed: Yes] [3:Tissue) Exposed: Yes] Fascia: No Fascia: No Fascia: No Tendon: No Tendon: No Tendon: No Muscle: No Muscle: No Muscle: No Joint: No Joint: No Joint: No Bone: No Bone: No Bone: No Epithelialization: None None None Periwound Skin Texture: Excoriation: No Excoriation: No Excoriation: No Induration: No Induration: No Induration: No Callus: No Callus: No Callus: No Crepitus: No Crepitus: No Crepitus: No Rash: No Rash: No Rash: No Scarring: No Scarring: No Scarring: No Periwound Skin Maceration: No Maceration: No Maceration: No Moisture: Dry/Scaly: No Dry/Scaly: No Dry/Scaly: No Periwound Skin Color: Erythema: Yes Erythema: Yes Erythema: Yes Atrophie Blanche: No Atrophie Blanche: No Atrophie Blanche: No Cyanosis: No Cyanosis: No Cyanosis: No Ecchymosis: No Ecchymosis: No Ecchymosis: No Hemosiderin Staining: No Hemosiderin Staining: No Hemosiderin Staining: No Mottled: No Mottled: No Mottled:  No Pallor: No Pallor: No Pallor: No Rubor: No Rubor: No Rubor: No Erythema Location: Circumferential Circumferential Circumferential Temperature: No Abnormality No Abnormality No Abnormality Tenderness on Yes No Yes Palpation: Wound Preparation: Ulcer Cleansing: Ulcer Cleansing: Ulcer Cleansing: Rinsed/Irrigated with Rinsed/Irrigated with Rinsed/Irrigated with Saline Saline Saline Topical Anesthetic Topical Anesthetic Topical Anesthetic Applied: Other: Lidocaine Applied: Other: Lidocaine Applied: Other: lidocain 4% 4% 4% Treatment Notes Wound #1 (Right, Anterior Knee) 1. Cleansed with: Clean wound with Normal Saline 4. Dressing Applied: Santyl Ointment 5. Secondary Dressing Applied Guaze, ABD and kerlix/Conform 7. Secured with Tape Wound #2 (Right, Medial Knee) 1. Cleansed with: Clean wound with Normal Saline [name], [name] ([id number]) 4. Dressing Applied: Santyl Ointment 5. Secondary Dressing Applied Guaze, ABD and kerlix/Conform 7. Secured with Tape Wound #3 (Right, Lateral Knee) 1. Cleansed with: Clean wound with Normal Saline 4. Dressing Applied: Santyl Ointment 5. Secondary Dressing Applied Guaze, ABD and kerlix/Conform 7. Secured with Recruitment consultant) Signed: 08/14/2016 11:27:10 AM By: Christin Fudge MD, FACS Entered By: Christin Fudge on 08/14/2016 11:27:10 [name] ([id number]) -------------------------------------------------------------------------------- [place] Details Patient Name: [name] Gathers T. Date of Service: 08/14/2016 9:00 AM Medical Record Number: [id number] Patient  Account Number: [id number] Date of Birth/Sex: [birth year] (71 y.o. Male) Treating RN: Baruch Gouty, RN, BSN, Maria Antonia Primary Care Jory Welke: [name] Dupre Other Clinician: Referring Raliegh Scobie: Frazier Richards Treating Blease Capaldi/Extender: Frann Rider in Treatment: 0 Active Inactive ` Orientation to the Wound Care Program Nursing  Diagnoses: Knowledge deficit related to the wound healing center program Goals: Patient/caregiver will verbalize understanding of the [facility] Program Date Initiated: 08/14/2016 Target Resolution Date: 12/14/2016 Goal Status: Active Interventions: Provide education on orientation to the wound center Notes: ` Pressure Nursing Diagnoses: Knowledge deficit related to causes and risk factors for pressure ulcer development Knowledge deficit related to management of pressures ulcers Potential for impaired tissue integrity related to pressure, friction, moisture, and shear Goals: Patient will remain free from development of additional pressure ulcers Date Initiated: 08/14/2016 Target Resolution Date: 12/14/2016 Goal Status: Active Patient will remain free of pressure ulcers Date Initiated: 08/14/2016 Target Resolution Date: 12/14/2016 Goal Status: Active Patient/caregiver will verbalize risk factors for pressure ulcer development Date Initiated: 08/14/2016 Target Resolution Date: 12/14/2016 Goal Status: Active Patient/caregiver will verbalize understanding of pressure ulcer management Date Initiated: 08/14/2016 Target Resolution Date: 12/14/2016 Goal Status: Active [name], [name] ([id number]) Interventions: Assess: immobility, friction, shearing, incontinence upon admission and as needed Assess offloading mechanisms upon admission and as needed Assess potential for pressure ulcer upon admission and as needed Provide education on pressure ulcers Treatment Activities: Patient referred for seating evaluation to ensure proper offloading : 08/14/2016 Pressure reduction/relief device ordered : 08/14/2016 Notes: ` Wound/Skin Impairment Nursing Diagnoses: Impaired tissue integrity Knowledge deficit related to ulceration/compromised skin integrity Goals: Patient/caregiver will verbalize understanding of skin care regimen Date Initiated: 08/14/2016 Target Resolution Date:  12/14/2016 Goal Status: Active Ulcer/skin breakdown will have a volume reduction of 30% by week 4 Date Initiated: 08/14/2016 Target Resolution Date: 12/14/2016 Goal Status: Active Ulcer/skin breakdown will have a volume reduction of 50% by week 8 Date Initiated: 08/14/2016 Target Resolution Date: 12/14/2016 Goal Status: Active Ulcer/skin breakdown will have a volume reduction of 80% by week 12 Date Initiated: 08/14/2016 Target Resolution Date: 12/14/2016 Goal Status: Active Ulcer/skin breakdown will heal within 14 weeks Date Initiated: 08/14/2016 Target Resolution Date: 12/14/2016 Goal Status: Active Interventions: Assess patient/caregiver ability to obtain necessary supplies Assess patient/caregiver ability to perform ulcer/skin care regimen upon admission and as needed Assess ulceration(s) every visit Provide education on ulcer and skin care Treatment Activities: Referred to DME Olena Willy for dressing supplies : 08/14/2016 Skin care regimen initiated : 08/14/2016 ARA, MANO ([id number]) Topical wound management initiated : 08/14/2016 Notes: Electronic Signature(s) Signed: 08/14/2016 1:23:55 PM By: Regan Lemming BSN, RN Entered By: Regan Lemming on 08/14/2016 09:53:45 [name], [name] ([id number]) -------------------------------------------------------------------------------- Pain Assessment Details Patient Name: [name] Gathers T. Date of Service: 08/14/2016 9:00 AM Medical Record Number: [id number] Patient Account Number: [id number] Date of Birth/Sex: Jan 12, 1946 (71 y.o. Male) Treating RN: Baruch Gouty, RN, BSN, Velva Harman Primary Care Kavya Haag: [name] Dupre Other Clinician: Referring Dajana Gehrig: Frazier Richards Treating Nichalas Coin/Extender: Frann Rider in Treatment: 0 Active Problems Location of Pain Severity and Description of Pain Patient Has Paino No Site Locations Pain Management and Medication Current Pain Management: Electronic Signature(s) Signed: 08/14/2016 1:23:55 PM  By: Regan Lemming BSN, RN Entered By: Regan Lemming on 08/14/2016 09:29:11 [name], [name] ([id number]) -------------------------------------------------------------------------------- Patient/Caregiver Education Details Patient Name: [name]. Date of Service: 08/14/2016 9:00 AM Medical Record Number: [id number] Patient Account Number: [id number] Date of Birth/Gender: 1946/01/02 (71 y.o. Male) Treating RN: Baruch Gouty, RN, BSN, Velva Harman Primary Care Physician: [name] Dupre Other Clinician:  Referring Physician: Frazier Richards Treating Physician/Extender: Frann Rider in Treatment: 0 Education Assessment Education Provided To: Patient Education Topics Provided Pressure: Methods: Explain/Verbal Responses: State content correctly Welcome To [facility]: Methods: Explain/Verbal Responses: State content correctly Wound/Skin Impairment: Methods: Explain/Verbal Responses: State content correctly Electronic Signature(s) Signed: 08/14/2016 1:23:55 PM By: Regan Lemming BSN, RN Entered By: Regan Lemming on 08/14/2016 11:10:02 [name], [name] ([id number]) -------------------------------------------------------------------------------- Wound Assessment Details Patient Name: [name] Gathers T. Date of Service: 08/14/2016 9:00 AM Medical Record Number: [id number] Patient Account Number: [id number] Date of Birth/Sex: [birth year] (71 y.o. Male) Treating RN: Baruch Gouty, RN, BSN, Velva Harman Primary Care Lendora Keys: [name] Dupre Other Clinician: Referring Arryanna Holquin: Frazier Richards Treating Jadyn Barge/Extender: Frann Rider in Treatment: 0 Wound Status Wound Number: 1 Primary Diabetic Wound/Ulcer of the Lower Etiology: Extremity Wound Location: Right Knee - Anterior Wound Open Wounding Event: Pressure Injury Status: Date Acquired: 07/23/2016 Comorbid Anemia, Asthma, Arrhythmia, Weeks Of Treatment: 0 History: Congestive Heart Failure, Coronary Clustered Wound: No Artery  Disease, Hypertension, Type II Diabetes, Osteoarthritis Photos Photo Uploaded By: Regan Lemming on 08/14/2016 15:20:52 Wound Measurements Length: (cm) 2.5 Width: (cm) 4 Depth: (cm) 0.1 Area: (cm) 7.854 Volume: (cm) 0.785 % Reduction in Area: % Reduction in Volume: Epithelialization: None Tunneling: No Undermining: No Wound Description Classification: Unable to visualize wound bed Wound Margin: Flat and Intact Exudate Amount: Large Exudate Type: Serosanguineous Exudate Color: red, brown Foul Odor After Cleansing: No Slough/Fibrino Yes Wound Bed Granulation Amount: None Present (0%) Exposed Structure Necrotic Amount: Large (67-100%) Fascia Exposed: No Necrotic Quality: Eschar, Adherent Slough Fat Layer (Subcutaneous Tissue) Exposed: Yes [name], Daquawn T. ([id number]) Tendon Exposed: No Muscle Exposed: No Joint Exposed: No Bone Exposed: No Periwound Skin Texture Texture Color No Abnormalities Noted: No No Abnormalities Noted: No Callus: No Atrophie Blanche: No Crepitus: No Cyanosis: No Excoriation: No Ecchymosis: No Induration: No Erythema: Yes Rash: No Erythema Location: Circumferential Scarring: No Hemosiderin Staining: No Mottled: No Moisture Pallor: No No Abnormalities Noted: No Rubor: No Dry / Scaly: No Maceration: No Temperature / Pain Temperature: No Abnormality Tenderness on Palpation: Yes Wound Preparation Ulcer Cleansing: Rinsed/Irrigated with Saline Topical Anesthetic Applied: Other: Lidocaine 4%, Treatment Notes Wound #1 (Right, Anterior Knee) 1. Cleansed with: Clean wound with Normal Saline 4. Dressing Applied: Santyl Ointment 5. Secondary Dressing Applied Guaze, ABD and kerlix/Conform 7. Secured with Recruitment consultant) Signed: 08/14/2016 1:23:55 PM By: Regan Lemming BSN, RN Entered By: Regan Lemming on 08/14/2016 09:41:44 Gilman, [name]  ([id number]) -------------------------------------------------------------------------------- Wound Assessment Details Patient Name: [name] Gathers T. Date of Service: 08/14/2016 9:00 AM Medical Record Number: [id number] Patient Account Number: [id number] Date of Birth/Sex: May 07, 1946 (71 y.o. Male) Treating RN: Baruch Gouty, RN, BSN, [place] Primary Care Juanantonio Stolar: [name] Dupre Other Clinician: Referring Bertel Venard: Frazier Richards Treating Daizy Outen/Extender: Frann Rider in Treatment: 0 Wound Status Wound Number: 2 Primary Diabetic Wound/Ulcer of the Lower Etiology: Extremity Wound Location: Right Knee - Medial Wound Open Wounding Event: Pressure Injury Status: Date Acquired: 07/23/2016 Comorbid Anemia, Asthma, Arrhythmia, Weeks Of Treatment: 0 History: Congestive Heart Failure, Coronary Clustered Wound: No Artery Disease, Hypertension, Type II Diabetes, Osteoarthritis Photos Photo Uploaded By: Regan Lemming on 08/14/2016 15:21:23 Wound Measurements Length: (cm) 2 Width: (cm) 2 Depth: (cm) 0.1 Area: (cm) 3.142 Volume: (cm) 0.314 % Reduction in Area: % Reduction in Volume: Epithelialization: None Tunneling: No Undermining: No Wound Description Classification: Unable to visualize wound bed Wound Margin: Flat and Intact Exudate Amount: Large Breton, Alex T. ([id number]) Foul Odor After Cleansing: No Slough/Fibrino Yes  Exudate Type: Serosanguineous Exudate Color: red, brown Wound Bed Granulation Amount: None Present (0%) Exposed Structure Necrotic Amount: Large (67-100%) Fascia Exposed: No Necrotic Quality: Eschar, Adherent Slough Fat Layer (Subcutaneous Tissue) Exposed: Yes Tendon Exposed: No Muscle Exposed: No Joint Exposed: No Bone Exposed: No Periwound Skin Texture Texture Color No Abnormalities Noted: No No Abnormalities Noted: No Callus: No Atrophie Blanche: No Crepitus: No Cyanosis: No Excoriation: No Ecchymosis: No Induration: No Erythema:  Yes Rash: No Erythema Location: Circumferential Scarring: No Hemosiderin Staining: No Mottled: No Moisture Pallor: No No Abnormalities Noted: No Rubor: No Dry / Scaly: No Maceration: No Temperature / Pain Temperature: No Abnormality Wound Preparation Ulcer Cleansing: Rinsed/Irrigated with Saline Topical Anesthetic Applied: Other: Lidocaine 4%, Treatment Notes Wound #2 (Right, Medial Knee) 1. Cleansed with: Clean wound with Normal Saline 4. Dressing Applied: Santyl Ointment 5. Secondary Dressing Applied Guaze, ABD and kerlix/Conform 7. Secured with Recruitment consultant) Signed: 08/14/2016 1:23:55 PM By: Regan Lemming BSN, RN Entered By: Regan Lemming on 08/14/2016 09:43:48 Nguyen, [name] ([id number]) Nolde, [name] ([id number]) -------------------------------------------------------------------------------- Wound Assessment Details Patient Name: Roedl, Ora T. Date of Service: 08/14/2016 9:00 AM Medical Record Number: [id number] Patient Account Number: [id number] Date of Birth/Sex: [birth year] (71 y.o. Male) Treating RN: Baruch Gouty, RN, BSN, [place] Primary Care Cartrell Bentsen: [name] Dupre Other Clinician: Referring Demya Scruggs: Frazier Richards Treating Nussen Pullin/Extender: Frann Rider in Treatment: 0 Wound Status Wound Number: 3 Primary Diabetic Wound/Ulcer of the Lower Etiology: Extremity Wound Location: Right Knee - Lateral Wound Open Wounding Event: Pressure Injury Status: Date Acquired: 07/23/2016 Comorbid Anemia, Asthma, Arrhythmia, Weeks Of Treatment: 0 History: Congestive Heart Failure, Coronary Clustered Wound: No Artery Disease, Hypertension, Type II Diabetes, Osteoarthritis Photos Photo Uploaded By: Regan Lemming on 08/14/2016 15:21:24 Wound Measurements Length: (cm) 2.2 Width: (cm) 3.3 Depth: (cm) 0.1 Area: (cm) 5.702 Volume: (cm) 0.57 % Reduction in Area: % Reduction in Volume: Epithelialization: None Tunneling: No Undermining:  No Wound Description Classification: Grade 1 Foul Odor Aft Wound Margin: Distinct, outline attached Slough/Fibrin Exudate Amount: Large Exudate Type: Serosanguineous Exudate Color: red, brown er Cleansing: No o Yes Wound Bed Granulation Amount: Medium (34-66%) Exposed Structure Granulation Quality: Pink, Pale Fascia Exposed: No Necrotic Amount: Small (1-33%) Fat Layer (Subcutaneous Tissue) Exposed: Yes Greb, Elie T. ([id number]) Necrotic Quality: Adherent Slough Tendon Exposed: No Muscle Exposed: No Joint Exposed: No Bone Exposed: No Periwound Skin Texture Texture Color No Abnormalities Noted: No No Abnormalities Noted: No Callus: No Atrophie Blanche: No Crepitus: No Cyanosis: No Excoriation: No Ecchymosis: No Induration: No Erythema: Yes Rash: No Erythema Location: Circumferential Scarring: No Hemosiderin Staining: No Mottled: No Moisture Pallor: No No Abnormalities Noted: No Rubor: No Dry / Scaly: No Maceration: No Temperature / Pain Temperature: No Abnormality Tenderness on Palpation: Yes Wound Preparation Ulcer Cleansing: Rinsed/Irrigated with Saline Topical Anesthetic Applied: Other: lidocain 4%, Treatment Notes Wound #3 (Right, Lateral Knee) 1. Cleansed with: Clean wound with Normal Saline 4. Dressing Applied: Santyl Ointment 5. Secondary Dressing Applied Guaze, ABD and kerlix/Conform 7. Secured with Recruitment consultant) Signed: 08/14/2016 1:23:55 PM By: Regan Lemming BSN, RN Entered By: Regan Lemming on 08/14/2016 09:48:23 Larzelere, [name] ([id number]) -------------------------------------------------------------------------------- Vitals Details Patient Name: [name] Gathers T. Date of Service: 08/14/2016 9:00 AM Medical Record Number: [id number] Patient Account Number: [id number] Date of Birth/Sex: [birth year] (71 y.o. Male) Treating RN: Baruch Gouty, RN, BSN, Velva Harman Primary Care Chenita Ruda: [name] Dupre Other Clinician: Referring Dejanee Thibeaux:  Frazier Richards Treating Lerin Jech/Extender: Frann Rider in Treatment: 0 Vital Signs Time Taken: 09:29 Temperature (F): 97.8  Height (in): 67 Pulse (bpm): 72 Source: Stated Respiratory Rate (breaths/min): 20 Weight (lbs): 357 Blood Pressure (mmHg): 140/47 Source: Stated Reference Range: 80 - 120 mg / dl Body Mass Index (BMI): 55.9 Electronic Signature(s) Signed: 08/14/2016 1:23:55 PM By: Regan Lemming BSN, RN Entered By: Regan Lemming on 08/14/2016 09:30:43

## 2016-08-15 NOTE — Progress Notes (Signed)
[name], [name] ([id number]) Visit Report for 08/14/2016 Abuse/Suicide Risk Screen Details Patient Name: [name], [name]. Date of Service: 08/14/2016 9:00 AM Medical Record Number: [id number] Patient Account Number: [id number] Date of Birth/Sex: 20-Apr-1946 (71 y.o. Male) Treating RN: Baruch Gouty, RN, BSN, Velva Harman Primary Care Offie Waide: [name] Dupre Other Clinician: Referring Elzia Hott: Frazier Richards Treating Layton Tappan/Extender: Frann Rider in Treatment: 0 Abuse/Suicide Risk Screen Items Answer ABUSE/SUICIDE RISK SCREEN: Has anyone close to you tried to hurt or harm you recentlyo No Do you feel uncomfortable with anyone in your familyo No Has anyone forced you do things that you didnot want to doo No Do you have any thoughts of harming yourselfo No Patient displays signs or symptoms of abuse and/or neglect. No Electronic Signature(s) Signed: 08/14/2016 1:23:55 PM By: Regan Lemming BSN, RN Entered By: Regan Lemming on 08/14/2016 09:30:58 [name] ([id number]) -------------------------------------------------------------------------------- Activities of Daily Living Details Patient Name: [name] Gathers T. Date of Service: 08/14/2016 9:00 AM Medical Record Number: [id number] Patient Account Number: [id number] Date of Birth/Sex: 1946/03/25 (71 y.o. Male) Treating RN: Baruch Gouty, RN, BSN, Velva Harman Primary Care Sherica Paternostro: [name] Dupre Other Clinician: Referring Kalesha Irving: Frazier Richards Treating Pyper Olexa/Extender: Frann Rider in Treatment: 0 Activities of Daily Living Items Answer Activities of Daily Living (Please select one for each item) Drive Automobile Not Able Take Medications Need Assistance Use Telephone Need Assistance Care for Appearance Need Assistance Use Toilet Need Assistance Bath / Shower Need Assistance Dress Self Need Assistance Feed Self Need Assistance Walk Need Assistance Get In / Out Bed Need Assistance Housework Need Assistance Prepare  Meals Need Assistance Handle Money Need Assistance Shop for Self Need Assistance Electronic Signature(s) Signed: 08/14/2016 1:23:55 PM By: Regan Lemming BSN, RN Entered By: Regan Lemming on 08/14/2016 09:31:39 [name] ([id number]) -------------------------------------------------------------------------------- Education Assessment Details Patient Name: [name] Mule. Date of Service: 08/14/2016 9:00 AM Medical Record Number: [id number] Patient Account Number: [id number] Date of Birth/Sex: 08/13/1945 (71 y.o. Male) Treating RN: Baruch Gouty, RN, BSN, Velva Harman Primary Care Ottie Neglia: [name] Dupre Other Clinician: Referring Aaryanna Hyden: Frazier Richards Treating Yahira Timberman/Extender: Frann Rider in Treatment: 0 Primary Learner Assessed: Patient Learning Preferences/Education Level/Primary Language Learning Preference: Explanation Highest Education Level: High School Preferred Language: English Cognitive Barrier Assessment/Beliefs Language Barrier: No Physical Barrier Assessment Impaired Vision: Yes Glasses Impaired Hearing: No Decreased Hand dexterity: No Knowledge/Comprehension Assessment Knowledge Level: High Comprehension Level: High Ability to understand written High instructions: Ability to understand verbal High instructions: Motivation Assessment Anxiety Level: Calm Cooperation: Cooperative Education Importance: Acknowledges Need Interest in Health Problems: Asks Questions Perception: Coherent Willingness to Engage in Self- Medium Management Activities: Readiness to Engage in Self- Medium Management Activities: Electronic Signature(s) Signed: 08/14/2016 1:23:55 PM By: Regan Lemming BSN, RN Entered By: Regan Lemming on 08/14/2016 09:32:20 [name] Mule ([id number]) -------------------------------------------------------------------------------- Fall Risk Assessment Details Patient Name: [name] Mule. Date of Service: 08/14/2016 9:00 AM Medical Record  Number: [id number] Patient Account Number: [id number] Date of Birth/Sex: February 22, 1946 (71 y.o. Male) Treating RN: Baruch Gouty, RN, BSN, [place] Primary Care Marciana Uplinger: [name] Dupre Other Clinician: Referring Anber Mckiver: Frazier Richards Treating Zariah Cavendish/Extender: Frann Rider in Treatment: 0 Fall Risk Assessment Items Have you had 2 or more falls in the last 12 monthso 0 Yes Have you had any fall that resulted in injury in the last 12 monthso 0 No FALL RISK ASSESSMENT: History of falling - immediate or within 3 months 0 No Secondary diagnosis 0 No Ambulatory aid None/bed rest/wheelchair/nurse 0 Yes Crutches/cane/walker 0 No Furniture 0 No IV Access/Saline Lock 0  No Gait/Training Normal/bed rest/immobile 0 No Weak 10 Yes Impaired 20 Yes Mental Status Oriented to own ability 0 Yes Electronic Signature(s) Signed: 08/14/2016 1:23:55 PM By: Regan Lemming BSN, RN Entered By: Regan Lemming on 08/14/2016 09:32:56 [name] ([id number]) -------------------------------------------------------------------------------- Foot Assessment Details Patient Name: [name] Gathers T. Date of Service: 08/14/2016 9:00 AM Medical Record Number: [id number] Patient Account Number: [id number] Date of Birth/Sex: October 26, 1945 (71 y.o. Male) Treating RN: Baruch Gouty, RN, BSN, Velva Harman Primary Care Tayona Sarnowski: [name] Dupre Other Clinician: Referring Julien Oscar: Frazier Richards Treating Teandre Hamre/Extender: Frann Rider in Treatment: 0 Foot Assessment Items Site Locations + = Sensation present, - = Sensation absent, C = Callus, U = Ulcer R = Redness, W = Warmth, M = Maceration, PU = Pre-ulcerative lesion F = Fissure, S = Swelling, D = Dryness Assessment Right: Left: Other Deformity: No No Prior Foot Ulcer: No No Prior Amputation: No No Charcot Joint: No No Ambulatory Status: Ambulatory With Help Assistance Device: Wheelchair Gait: Administrator, arts) Signed: 08/14/2016 1:23:55 PM By:  Regan Lemming BSN, RN Entered By: Regan Lemming on 08/14/2016 09:33:15 [name] ([id number]) -------------------------------------------------------------------------------- Nutrition Risk Assessment Details Patient Name: [name] Gathers T. Date of Service: 08/14/2016 9:00 AM Medical Record Number: [id number] Patient Account Number: [id number] Date of Birth/Sex: 07-25-1945 (71 y.o. Male) Treating RN: Baruch Gouty, RN, BSN, [place] Primary Care Gini Caputo: [name] Dupre Other Clinician: Referring Naveen Clardy: Frazier Richards Treating Vylette Strubel/Extender: Frann Rider in Treatment: 0 Height (in): 67 Weight (lbs): 357 Body Mass Index (BMI): 55.9 Nutrition Risk Assessment Items NUTRITION RISK SCREEN: I have an illness or condition that made me change the kind and/or 0 No amount of food I eat I eat fewer than two meals per day 0 No I eat few fruits and vegetables, or milk products 0 No I have three or more drinks of beer, liquor or wine almost every day 0 No I have tooth or mouth problems that make it hard for me to eat 0 No I don't always have enough money to buy the food I need 0 No I eat alone most of the time 0 No I take three or more different prescribed or over-the-counter drugs a 0 No day Without wanting to, I have lost or gained 10 pounds in the last six 0 No months I am not always physically able to shop, cook and/or feed myself 0 No Nutrition Protocols Good Risk Protocol 0 No interventions needed Moderate Risk Protocol Electronic Signature(s) Signed: 08/14/2016 1:23:55 PM By: Regan Lemming BSN, RN Entered By: Regan Lemming on 08/14/2016 09:33:01

## 2016-08-15 NOTE — Progress Notes (Signed)
[name] [name] ([id number]) Visit Report for 08/14/2016 Chief Complaint Document Details Patient Name: [name] [name] [name] [name]. Date of Service: 08/14/2016 9:00 AM Medical Record Number: [id number] Patient Account Number: [id number] Date of Birth/Sex: Oct 14, 1945 (71 y.o. Male) Treating RN: Baruch Gouty, RN, BSN, Velva Harman Primary Care Provider: [name] Dupre Other Clinician: Referring Provider: Frazier Richards Treating Provider/Extender: Frann Rider in Treatment: 0 Information Obtained from: Patient Chief Complaint Patient is at the clinic for treatment of an open pressure ulcer to the area of the right knee for about 3 weeks now Electronic Signature(s) Signed: 08/14/2016 11:27:34 AM By: Christin Fudge MD, FACS Entered By: Christin Fudge on 08/14/2016 11:27:33 [name] [name] ([id number]) -------------------------------------------------------------------------------- HPI Details Patient Name: [name] Gathers T. Date of Service: 08/14/2016 9:00 AM Medical Record Number: [id number] Patient Account Number: [id number] Date of Birth/Sex: 10-Apr-1946 (71 y.o. Male) Treating RN: Baruch Gouty, RN, BSN, Velva Harman Primary Care Provider: [name] Dupre Other Clinician: Referring Provider: Frazier Richards Treating Provider/Extender: Frann Rider in Treatment: 0 History of Present Illness Location: right knee swelling with skin ulceration Quality: Patient reports experiencing a dull pain to affected area(s). Severity: Patient states wound are getting worse. Duration: Patient has had the wound for <4 weeks prior to presenting for treatment Timing: Pain in wound is Intermittent (comes and goes Context: The wound occurred when the patient had a cast placed over his right lower extremity and with the cast was removed he had a swelling of the knee with problems with his skin Modifying Factors: Other treatment(s) tried include:orthopedics Associated Signs and Symptoms: swelling of both lower extremities  with large amount of swelling of the knee HPI Description: 71 year old gentleman who has been living in an independent living facility for long-term care has had recurrent falls and recently had a large hematoma on the right knee. Known to be on aspirin and Plavix for history of CABG and was seen in the ER at the end of February after a fall. past medical history of arthritis, congestive heart failure, diabetes mellitus, hypertension, status post CABG o2, status post cholecystectomy, hernia repair and kidney surgery. recent workup for the fall showed negative clinical findings in his extremities and it was thought that he was falling due to deconditioning. a x-ray showed a right fibular neck fracture and was placed in a splint. he was asked to be seen by orthopedic surgeon. the patient cannot tell me which orthopedic doctor had seen him and I'm not able to get any notes from [place], regarding his orthopedic care. The patient says twice during this last week he has had bloody fluid shooting out of his knee and has drained all over his boot and the flow. Electronic Signature(s) Signed: 08/14/2016 11:27:46 AM By: Christin Fudge MD, FACS Previous Signature: 08/14/2016 10:27:59 AM Version By: Christin Fudge MD, FACS Previous Signature: 08/14/2016 10:24:24 AM Version By: Christin Fudge MD, FACS Previous Signature: 08/14/2016 9:39:07 AM Version By: Christin Fudge MD, FACS Previous Signature: 08/14/2016 9:35:30 AM Version By: Christin Fudge MD, FACS Entered By: Christin Fudge on 08/14/2016 11:27:46 [name] [name] ([id number]) -------------------------------------------------------------------------------- Physical Exam Details Patient Name: [name], [name] T. Date of Service: 08/14/2016 9:00 AM Medical Record Number: [id number] Patient Account Number: [id number] Date of Birth/Sex: [birth year] (71 y.o. Male) Treating RN: Baruch Gouty, RN, BSN, Velva Harman Primary Care Provider: [name] Dupre Other Clinician: Referring  Provider: Frazier Richards Treating Provider/Extender: Frann Rider in Treatment: 0 Constitutional . Pulse regular. Respirations normal and unlabored. Afebrile. . Eyes Nonicteric. Reactive to light. Ears, Nose, Mouth, and Throat Lips, teeth, and  gums WNL.[name] Moist mucosa without lesions. Neck supple and nontender. No palpable supraclavicular or cervical adenopathy. Normal sized without goiter. Respiratory WNL. No retractions.. Breath sounds WNL, No rubs, rales, rhonchi, or wheeze.. Cardiovascular Heart rhythm and rate regular, no murmur or gallop.. Pedal Pulses WNL. No clubbing, cyanosis or edema. Gastrointestinal (GI) Abdomen without masses or tenderness.. No liver or spleen enlargement or tenderness.. Lymphatic No adneopathy. No adenopathy. No adenopathy. Musculoskeletal Adexa without tenderness or enlargement.. Digits and nails w/o clubbing, cyanosis, infection, petechiae, ischemia, or inflammatory conditions.. Integumentary (Hair, Skin) No suspicious lesions. No crepitus or fluctuance. No peri-wound warmth or erythema. No masses.[name] Psychiatric Judgement and insight Intact.. No evidence of depression, anxiety, or agitation.. Notes he has a large ballotable swelling of the right knee with few ulcerations which are unstageable and these are caused by pressure when he had his cast. There is no active evidence of seroma or hematoma leaking through. No cellulitis Electronic Signature(s) Signed: 08/14/2016 11:28:36 AM By: Christin Fudge MD, FACS Entered By: Christin Fudge on 08/14/2016 11:28:35 [name] [name] ([id number]) -------------------------------------------------------------------------------- Physician Orders Details Patient Name: [name] Gathers T. Date of Service: 08/14/2016 9:00 AM Medical Record Number: [id number] Patient Account Number: [id number] Date of Birth/Sex: 1945/09/03 (71 y.o. Male) Treating RN: Baruch Gouty, RN, BSN, [place] Primary Care Provider: [name] Dupre Other Clinician: Referring Provider: Frazier Richards Treating Provider/Extender: Frann Rider in Treatment: 0 Verbal / Phone Orders: No Diagnosis Coding Wound Cleansing Wound #1 Right,Anterior Knee o Cleanse wound with mild soap and water Wound #2 Right,Medial Knee o Cleanse wound with mild soap and water Wound #3 Right,Lateral Knee o Cleanse wound with mild soap and water Anesthetic Wound #1 Right,Anterior Knee o Topical Lidocaine 4% cream applied to wound bed prior to debridement - Only in the Wound Clinic Wound #2 Right,Medial Knee o Topical Lidocaine 4% cream applied to wound bed prior to debridement - Only in the Wound Clinic Wound #3 Right,Lateral Knee o Topical Lidocaine 4% cream applied to wound bed prior to debridement - Only in the Wound Clinic Primary Wound Dressing Wound #1 Right,Anterior Knee o Santyl Ointment Wound #2 Right,Medial Knee o Santyl Ointment Wound #3 Right,Lateral Knee o Santyl Ointment Secondary Dressing Wound #1 Right,Anterior Knee o Gauze, ABD and Kerlix/Conform [name] [name] [name] T. ([id number]) Wound #2 Right,Medial Knee o Gauze, ABD and Kerlix/Conform Wound #3 Right,Lateral Knee o Gauze, ABD and Kerlix/Conform Dressing Change Frequency Wound #1 Right,Anterior Knee o Change dressing every day. Wound #2 Right,Medial Knee o Change dressing every day. Wound #3 Right,Lateral Knee o Change dressing every day. Follow-up Appointments Wound #1 Right,Anterior Knee o Return Appointment in 1 week. Wound #2 Right,Medial Knee o Return Appointment in 1 week. Wound #3 Right,Lateral Knee o Return Appointment in 1 week. Additional Orders / Instructions Wound #1 Right,Anterior Knee o Activity as tolerated o Other: - NEEDS TO BE SEEN THE ORTHOPEDIC FOR RE-EVALUATION OF RIGHT KNEE FOR POSSIBLE FLUID ASPIRATION. Wound #2 Right,Medial Knee o Activity as tolerated o Other: - NEEDS TO BE SEEN  THE ORTHOPEDIC FOR RE-EVALUATION OF RIGHT KNEE FOR POSSIBLE FLUID ASPIRATION. Wound #3 Right,Lateral Knee o Activity as tolerated o Other: - NEEDS TO BE SEEN THE ORTHOPEDIC FOR RE-EVALUATION OF RIGHT KNEE FOR POSSIBLE FLUID ASPIRATION. Patient Medications Allergies: Cephalosporins, Dilaudid, hydrocodone, morphine, PCN, SUlfa, teteracyclines Notifications Medication Indication Start End Santyl 08/14/2016 Kaelin, Pinchus T. ([id number]) Notifications Medication Indication Start End DOSE topical 250 unit/gram ointment - ointment topical as directed Electronic Signature(s) Signed: 08/14/2016 11:29:35 AM By: Christin Fudge MD, FACS Entered  By: Christin Fudge on 08/14/2016 11:29:34 [name] [name] [name] [name] ([id number]) -------------------------------------------------------------------------------- Problem List Details Patient Name: [name] Gathers T. Date of Service: 08/14/2016 9:00 AM Medical Record Number: [id number] Patient Account Number: [id number] Date of Birth/Sex: [birth year] (71 y.o. 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, right knee 08/14/2016 Yes L97.812 Non-pressure chronic ulcer of other part of right lower leg 08/14/2016 Yes with fat layer exposed L89.892 Pressure ulcer of other site, stage 2 08/14/2016 Yes M70.861 Other soft tissue disorders related to use, overuse and 08/14/2016 Yes pressure, right lower leg Inactive Problems Resolved Problems Electronic Signature(s) Signed: 08/14/2016 11:27:03 AM By: Christin Fudge MD, FACS Entered By: Christin Fudge on 08/14/2016 11:27:03 [name] [name] [name] [name]  ([id number]) -------------------------------------------------------------------------------- Progress Note Details Patient Name: [name] Gathers T. Date of Service: 08/14/2016 9:00 AM Medical Record Number: [id number] Patient Account Number: [id number] Date of Birth/Sex: 1946/04/14 (71 y.o. Male) Treating RN: Baruch Gouty, RN, BSN, Velva Harman Primary Care Provider: [name] Dupre Other Clinician: Referring Provider: Frazier Richards Treating Provider/Extender: Frann Rider in Treatment: 0 Subjective Chief Complaint Information obtained from Patient Patient is at the clinic for treatment of an open pressure ulcer to the area of the right knee for about 3 weeks now History of Present Illness (HPI) The following HPI elements were documented for the patient's wound: Location: right knee swelling with skin ulceration Quality: Patient reports experiencing a dull pain to affected area(s). Severity: Patient states wound are getting worse. Duration: Patient has had the wound for Timing: Pain in wound is Intermittent (comes and goes Context: The wound occurred when the patient had a cast placed over his right lower extremity and with the cast was removed he had a swelling of the knee with problems with his skin Modifying Factors: Other treatment(s) tried include:orthopedics Associated Signs and Symptoms: swelling of both lower extremities with large amount of swelling of the knee 71 year old gentleman who has been living in an independent living facility for long-term care has had recurrent falls and recently had a large hematoma on the right knee. Known to be on aspirin and Plavix for history of CABG and was seen in the ER at the end of February after a fall. past medical history of arthritis, congestive heart failure, diabetes mellitus, hypertension, status post CABG o2, status post cholecystectomy, hernia repair and kidney surgery. recent workup for the fall showed negative clinical findings  in his extremities and it was thought that he was falling due to deconditioning. a x-ray showed a right fibular neck fracture and was placed in a splint. he was asked to be seen by orthopedic surgeon. the patient cannot tell me which orthopedic doctor had seen him and I'm not able to get any notes from [place], regarding his orthopedic care. The patient says twice during this last week he has had bloody fluid shooting out of his knee and has drained all over his boot and the flow. Wound History Patient presents with 3 open wounds that have been present for approximately 3 weeks. Patient has been treating wounds in the following manner: dry dressing. Laboratory tests have not been performed in the last month. Patient reportedly has not tested positive for an antibiotic resistant organism. Patient reportedly has not tested positive for osteomyelitis. Patient reportedly has not had testing performed to  evaluate circulation in the legs. Patient experiences the following problems associated with their wounds: swelling. Patient History [name] [name] [name] [name] ([id number]) Information obtained from Patient, Caregiver. Allergies Cephalosporins, Dilaudid, hydrocodone, morphine, PCN, SUlfa, teteracyclines Family History Diabetes - Mother, Siblings, Maternal Grandparents, Heart Disease - Mother, Hypertension - Mother, Father, No family history of Cancer, Hereditary Spherocytosis, Kidney Disease, Lung Disease, Seizures, Stroke, Thyroid Problems, Tuberculosis. Social History Former smoker, Marital Status - Widowed, Alcohol Use - Never, Drug Use - No History, Caffeine Use - Moderate. Medical History Eyes Denies history of Cataracts, Glaucoma, Optic Neuritis Ear/Nose/Mouth/Throat Denies history of Chronic sinus problems/congestion, Middle ear problems Hematologic/Lymphatic Patient has history of Anemia Denies history of Hemophilia, Human Immunodeficiency Virus, Lymphedema, Sickle Cell  Disease Respiratory Patient has history of Asthma Denies history of Aspiration, Chronic Obstructive Pulmonary Disease (COPD), Pneumothorax, Sleep Apnea, Tuberculosis Cardiovascular Patient has history of Arrhythmia, Congestive Heart Failure, Coronary Artery Disease, Hypertension Gastrointestinal Denies history of Cirrhosis , Colitis, Crohn s, Hepatitis A, Hepatitis B, Hepatitis C Endocrine Patient has history of Type II Diabetes Genitourinary Denies history of End Stage Renal Disease Immunological Denies history of Lupus Erythematosus, Raynaud s, Scleroderma Integumentary (Skin) Denies history of History of Burn, History of pressure wounds Musculoskeletal Patient has history of Osteoarthritis Neurologic Denies history of Dementia, Neuropathy, Quadriplegia, Paraplegia, Seizure Disorder Oncologic Denies history of Received Chemotherapy, Received Radiation Psychiatric Denies history of Anorexia/bulimia, Confinement Anxiety Blood sugar is not tested. [name] [name] [name] [name] ([id number]) Medical And Surgical History Notes Cardiovascular Stroke, high cholesterol Gastrointestinal GERD Oncologic Skin Cancer Review of Systems (ROS) Constitutional Symptoms (General Health) The patient has no complaints or symptoms. Eyes The patient has no complaints or symptoms. Ear/Nose/Mouth/Throat The patient has no complaints or symptoms. Hematologic/Lymphatic The patient has no complaints or symptoms. Respiratory The patient has no complaints or symptoms. Cardiovascular Complains or has symptoms of LE edema. Gastrointestinal The patient has no complaints or symptoms. Endocrine The patient has no complaints or symptoms. Genitourinary The patient has no complaints or symptoms. Immunological The patient has no complaints or symptoms. Integumentary (Skin) Complains or has symptoms of Breakdown, Swelling. Musculoskeletal The patient has no complaints or symptoms. Neurologic The patient has  no complaints or symptoms. Oncologic The patient has no complaints or symptoms. Psychiatric The patient has no complaints or symptoms. Objective Constitutional Pulse regular. Respirations normal and unlabored. Afebrile. [name] [name] [name] [name] ([id number]) Vitals Time Taken: 9:29 AM, Height: 67 in, Source: Stated, Weight: 357 lbs, Source: Stated, BMI: 55.9, Temperature: 97.8 F, Pulse: 72 bpm, Respiratory Rate: 20 breaths/min, Blood Pressure: 140/47 mmHg. Eyes Nonicteric. Reactive to light. Ears, Nose, Mouth, and Throat Lips, teeth, and gums WNL.[name] Moist mucosa without lesions. Neck supple and nontender. No palpable supraclavicular or cervical adenopathy. Normal sized without goiter. Respiratory WNL. No retractions.. Breath sounds WNL, No rubs, rales, rhonchi, or wheeze.. Cardiovascular Heart rhythm and rate regular, no murmur or gallop.. Pedal Pulses WNL. No clubbing, cyanosis or edema. Gastrointestinal (GI) Abdomen without masses or tenderness.. No liver or spleen enlargement or tenderness.. Lymphatic No adneopathy. No adenopathy. No adenopathy. Musculoskeletal Adexa without tenderness or enlargement.. Digits and nails w/o clubbing, cyanosis, infection, petechiae, ischemia, or inflammatory conditions.[name] Psychiatric Judgement and insight Intact.. No evidence of depression, anxiety, or agitation.. General Notes: he has a large ballotable swelling of the right knee with few ulcerations which are unstageable and these are caused by pressure when he had his cast. There is no active evidence of seroma or hematoma leaking through. No cellulitis Integumentary (Hair, Skin) No suspicious lesions. No  crepitus or fluctuance. No peri-wound warmth or erythema. No masses.. Wound #1 status is Open. Original cause of wound was Pressure Injury. The wound is located on the Right,Anterior Knee. The wound measures 2.5cm length x 4cm width x 0.1cm depth; 7.854cm^2 area and 0.785cm^3 volume. There is Fat  Layer (Subcutaneous Tissue) Exposed exposed. There is no tunneling or undermining noted. There is a large amount of serosanguineous drainage noted. The wound margin is flat and intact. There is no granulation within the wound bed. There is a large (67-100%) amount of necrotic tissue within the wound bed including Eschar and Adherent Slough. The periwound skin appearance exhibited: Erythema. The periwound skin appearance did not exhibit: Callus, Crepitus, Excoriation, Induration, Rash, Scarring, Dry/Scaly, Maceration, Atrophie Blanche, Cyanosis, Ecchymosis, Hemosiderin Staining, Mottled, Pallor, Rubor. The surrounding wound skin color is noted with erythema which is circumferential. Periwound temperature was noted as No Abnormality. The periwound has tenderness on palpation. [name] [name] [name] [name] ([id number]) Wound #2 status is Open. Original cause of wound was Pressure Injury. The wound is located on the Right,Medial Knee. The wound measures 2cm length x 2cm width x 0.1cm depth; 3.142cm^2 area and 0.314cm^3 volume. There is Fat Layer (Subcutaneous Tissue) Exposed exposed. There is no tunneling or undermining noted. There is a large amount of serosanguineous drainage noted. The wound margin is flat and intact. There is no granulation within the wound bed. There is a large (67-100%) amount of necrotic tissue within the wound bed including Eschar and Adherent Slough. The periwound skin appearance exhibited: Erythema. The periwound skin appearance did not exhibit: Callus, Crepitus, Excoriation, Induration, Rash, Scarring, Dry/Scaly, Maceration, Atrophie Blanche, Cyanosis, Ecchymosis, Hemosiderin Staining, Mottled, Pallor, Rubor. The surrounding wound skin color is noted with erythema which is circumferential. Periwound temperature was noted as No Abnormality. Wound #3 status is Open. Original cause of wound was Pressure Injury. The wound is located on the Right,Lateral Knee. The wound measures 2.2cm  length x 3.3cm width x 0.1cm depth; 5.702cm^2 area and 0.57cm^3 volume. There is Fat Layer (Subcutaneous Tissue) Exposed exposed. There is no tunneling or undermining noted. There is a large amount of serosanguineous drainage noted. The wound margin is distinct with the outline attached to the wound base. There is medium (34-66%) pink, pale granulation within the wound bed. There is a small (1-33%) amount of necrotic tissue within the wound bed including Adherent Slough. The periwound skin appearance exhibited: Erythema. The periwound skin appearance did not exhibit: Callus, Crepitus, Excoriation, Induration, Rash, Scarring, Dry/Scaly, Maceration, Atrophie Blanche, Cyanosis, Ecchymosis, Hemosiderin Staining, Mottled, Pallor, Rubor. The surrounding wound skin color is noted with erythema which is circumferential. Periwound temperature was noted as No Abnormality. The periwound has tenderness on palpation. 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 He has a large ballotable swelling of his right knee and a history of having hemo-serous fluid draining out of this on 2 different occasions, which has caused him much distress. At the present time he has pressure injuries to the right knee, which will be treated with Santyl ointment locally and a light dressing He needs to be reviewed by  his orthopedic surgeon, as soon as possible, regarding the ballotable fluid collection which is possibly the cause of his bleeding out of this area recently. As there is no caregiver with [name] [name],  [name] T. ([id number]) him, I have spoken to his rehabilitation supervisor named Claiborne Billings at the [facility]. she will make the appropriate arrangements. We'll see him back and review as planned Plan Wound Cleansing: Wound #1 Right,Anterior Knee: Cleanse wound with mild soap and water Wound #2 Right,Medial Knee: Cleanse wound with mild soap and water Wound #3 Right,Lateral Knee: Cleanse wound with mild soap and water Anesthetic: Wound #1 Right,Anterior Knee: Topical Lidocaine 4% cream applied to wound bed prior to debridement - Only in the Wound Clinic Wound #2 Right,Medial Knee: Topical Lidocaine 4% cream applied to wound bed prior to debridement - Only in the Wound Clinic Wound #3 Right,Lateral Knee: Topical Lidocaine 4% cream applied to wound bed prior to debridement - Only in the Wound Clinic Primary Wound Dressing: Wound #1 Right,Anterior Knee: Santyl Ointment Wound #2 Right,Medial Knee: Santyl Ointment Wound #3 Right,Lateral Knee: Santyl Ointment Secondary Dressing: Wound #1 Right,Anterior Knee: Gauze, ABD and Kerlix/Conform Wound #2 Right,Medial Knee: Gauze, ABD and Kerlix/Conform Wound #3 Right,Lateral Knee: Gauze, ABD and Kerlix/Conform Dressing Change Frequency: Wound #1 Right,Anterior Knee: Change dressing every day. Wound #2 Right,Medial Knee: Change dressing every day. Wound #3 Right,Lateral Knee: Change dressing every day. Follow-up Appointments: Wound #1 Right,Anterior Knee: Return Appointment in 1 week. Wound #2 Right,Medial Knee: Return Appointment in 1 week. Wound #3 Right,Lateral Knee: Return Appointment in 1 week. CHEZ, BULNES ([id number]) Additional Orders / Instructions: Wound #1 Right,Anterior Knee: Activity as tolerated Other: - NEEDS TO BE SEEN THE ORTHOPEDIC FOR RE-EVALUATION OF RIGHT KNEE FOR POSSIBLE FLUID ASPIRATION. Wound #2 Right,Medial Knee: Activity as tolerated Other: - NEEDS TO BE SEEN THE ORTHOPEDIC FOR RE-EVALUATION OF  RIGHT KNEE FOR POSSIBLE FLUID ASPIRATION. Wound #3 Right,Lateral Knee: Activity as tolerated Other: - NEEDS TO BE SEEN THE ORTHOPEDIC FOR RE-EVALUATION OF RIGHT KNEE FOR POSSIBLE FLUID ASPIRATION. The following medication(s) was prescribed: Santyl topical 250 unit/gram ointment ointment topical as directed starting 08/14/2016 This elderly gentleman comes to see [place] for review with no medical records and is a poor historian. He has a large ballotable swelling of his right knee and a history of having hemo-serous fluid draining out of this on 2 different occasions, which has caused him much distress. At the present time he has pressure injuries to the right knee, which will be treated with Santyl ointment locally and a light dressing He needs to be reviewed by his orthopedic surgeon, as soon as possible, regarding the ballotable fluid collection which is possibly the cause of his bleeding out of this area recently. As there is no caregiver with him, I have spoken to his rehabilitation supervisor named Claiborne Billings at the [facility]. she will make the appropriate arrangements. We'll see him back and review as planned Electronic Signature(s) Signed: 08/14/2016 11:32:50 AM By: Christin Fudge MD, FACS Entered By: Christin Fudge on 08/14/2016 11:32:50 Nicolls, [name] [name] ([id number]) -------------------------------------------------------------------------------- ROS/PFSH Details Patient Name: [name] Gathers T. Date of Service: 08/14/2016 9:00 AM Medical Record Number: [id number] Patient Account Number: [id number] Date of Birth/Sex: [birth year] (71 y.o. Male) Treating RN: Baruch Gouty, RN, BSN, Velva Harman Primary Care Provider: [name] Dupre Other Clinician: Referring Provider: Frazier Richards Treating Provider/Extender: Frann Rider in Treatment: 0 Information Obtained From Patient Caregiver Wound History Do you currently have one or more open woundso Yes How many open  wounds do you currently  haveo 3 Approximately how long have you had your woundso 3 weeks How have you been treating your wound(s) until nowo dry dressing Has your wound(s) ever healed and then re-openedo No Have you had any lab work done in the past montho No Have you tested positive for an antibiotic resistant organism (MRSA, VRE)o No Have you tested positive for osteomyelitis (bone infection)o No Have you had any tests for circulation on your legso No Have you had other problems associated with your woundso Swelling Cardiovascular Complaints and Symptoms: Positive for: LE edema Medical History: Positive for: Arrhythmia; Congestive Heart Failure; Coronary Artery Disease; Hypertension Past Medical History Notes: Stroke, high cholesterol Integumentary (Skin) Complaints and Symptoms: Positive for: Breakdown; Swelling Medical History: Negative for: History of Burn; History of pressure wounds Constitutional Symptoms (General Health) Complaints and Symptoms: No Complaints or Symptoms Eyes Complaints and Symptoms: No Complaints or Symptoms [name] [name] [name] [name] T. ([id number]) Medical History: Negative for: Cataracts; Glaucoma; Optic Neuritis Ear/Nose/Mouth/Throat Complaints and Symptoms: No Complaints or Symptoms Medical History: Negative for: Chronic sinus problems/congestion; Middle ear problems Hematologic/Lymphatic Complaints and Symptoms: No Complaints or Symptoms Medical History: Positive for: Anemia Negative for: Hemophilia; Human Immunodeficiency Virus; Lymphedema; Sickle Cell Disease Respiratory Complaints and Symptoms: No Complaints or Symptoms Medical History: Positive for: Asthma Negative for: Aspiration; Chronic Obstructive Pulmonary Disease (COPD); Pneumothorax; Sleep Apnea; Tuberculosis Gastrointestinal Complaints and Symptoms: No Complaints or Symptoms Medical History: Negative for: Cirrhosis ; Colitis; Crohnos; Hepatitis A; Hepatitis B; Hepatitis C Past Medical History  Notes: GERD Endocrine Complaints and Symptoms: No Complaints or Symptoms Medical History: Positive for: Type II Diabetes Time with diabetes: years Blood sugar tested every day: No Ryall, Aboubacar T. ([id number]) Genitourinary Complaints and Symptoms: No Complaints or Symptoms Medical History: Negative for: End Stage Renal Disease Immunological Complaints and Symptoms: No Complaints or Symptoms Medical History: Negative for: Lupus Erythematosus; Raynaudos; Scleroderma Musculoskeletal Complaints and Symptoms: No Complaints or Symptoms Medical History: Positive for: Osteoarthritis Neurologic Complaints and Symptoms: No Complaints or Symptoms Medical History: Negative for: Dementia; Neuropathy; Quadriplegia; Paraplegia; Seizure Disorder Oncologic Complaints and Symptoms: No Complaints or Symptoms Medical History: Negative for: Received Chemotherapy; Received Radiation Past Medical History Notes: Skin Cancer Psychiatric Complaints and Symptoms: No Complaints or Symptoms Medical History: Negative for: Anorexia/bulimia; Confinement Anxiety Family and Social History SHOGO, LARKEY. ([id number]) Cancer: No; Diabetes: Yes - Mother, Siblings, Maternal Grandparents; Heart Disease: Yes - Mother; Hereditary Spherocytosis: No; Hypertension: Yes - Mother, Father; Kidney Disease: No; Lung Disease: No; Seizures: No; Stroke: No; Thyroid Problems: No; Tuberculosis: No; Former smoker; Marital Status - Widowed; Alcohol Use: Never; Drug Use: No History; Caffeine Use: Moderate; Financial Concerns: No; Food, Clothing or Shelter Needs: No; Support System Lacking: No; Transportation Concerns: No; Advanced Directives: Yes (Not Provided); Patient does not want information on Advanced Directives; Living Will: Yes (Not Provided); Medical Power of Attorney: Yes - Fuller Makin..dtr (Not Provided) Physician Affirmation I have reviewed and agree with the above information. Electronic  Signature(s) Signed: 08/14/2016 1:23:55 PM By: Regan Lemming BSN, RN Signed: 08/14/2016 4:18:08 PM By: Christin Fudge MD, FACS Entered By: Christin Fudge on 08/14/2016 10:20:29 [name] [name] ([id number]) -------------------------------------------------------------------------------- SuperBill Details Patient Name: [name] Gathers T. Date of Service: 08/14/2016 Medical Record Number: [id number] Patient Account Number: [id number] Date of Birth/Sex: 1945/11/13 (71 y.o. 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 L89.892 Pressure ulcer of other site, stage 2 M70.861 Other soft tissue disorders related to use, overuse and pressure, right lower leg Facility Procedures CPT4 Code: 11216244 Description: [phone] - WOUND CARE VISIT-LEV 5 EST PT Modifier: Quantity: 1 Physician Procedures CPT4: Description Modifier Quantity Code 2257505 [id number] - WC PHYS LEVEL 4 - NEW PT 1 ICD-10 Description Diagnosis 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

## 2016-08-20 ENCOUNTER — Ambulatory Visit: Payer: Medicare Other | Admitting: Podiatry

## 2016-08-21 ENCOUNTER — Encounter: Payer: Medicare Other | Admitting: Surgery

## 2016-08-21 DIAGNOSIS — E11622 Type 2 diabetes mellitus with other skin ulcer: Secondary | ICD-10-CM | POA: Diagnosis not present

## 2016-08-22 NOTE — Progress Notes (Signed)
MONTAVIOUS, WIERZBA ([id number]) Visit Report for 08/21/2016 Chief Complaint Document Details Patient Name: [name], [name]. Date of Service: 08/21/2016 11:15 AM Medical Record Number: [id number] Patient Account Number: [id number] Date of Birth/Sex: Mar 21, 1946 (71 y.o. Male) Treating RN: Baruch Gouty, RN, BSN, Velva Harman Primary Care Provider: [name] Dupre Other Clinician: Referring Provider: [name] Dupre Treating Provider/Extender: Frann Rider in Treatment: 1 Information Obtained from: Patient Chief Complaint Patient is at the clinic for treatment of an open pressure ulcer to the area of the right knee for about 3 weeks now Electronic Signature(s) Signed: 08/21/2016 11:48:28 AM By: Christin Fudge MD, FACS Entered By: Christin Fudge on 08/21/2016 11:48:28 [name], [name] ([id number]) -------------------------------------------------------------------------------- Debridement Details Patient Name: [name] Gathers [name]. Date of Service: 08/21/2016 11:15 AM Medical Record Number: [id number] Patient Account Number: [id number] Date of Birth/Sex: Jun 08, 1945 (71 y.o. Male) Treating RN: Afful, RN, BSN, [place] Primary Care Provider: [name] Dupre Other Clinician: Referring Provider: [name] Dupre Treating Provider/Extender: Frann Rider in Treatment: 1 Debridement Performed for Wound #1 Right,Anterior Knee Assessment: Performed By: Physician Christin Fudge, MD Debridement: Debridement Pre-procedure Yes - 11:40 Verification/Time Out Taken: Start Time: 11:40 Pain Control: Lidocaine 4% Topical Solution Level: Skin/Subcutaneous Tissue Total Area Debrided (L x 2.4 (cm) x 4 (cm) = 9.6 (cm) W): Tissue and other Eschar, Fibrin/Slough, Subcutaneous material debrided: Instrument: Forceps, Scissors Bleeding: Minimum Hemostasis Achieved: Pressure End Time: 11:45 Procedural Pain: 0 Post Procedural Pain: 0 Response to Treatment: Procedure was tolerated well Post Debridement  Measurements of Total Wound Length: (cm) 2.4 Width: (cm) 4 Depth: (cm) 0.2 Volume: (cm) 1.508 Character of Wound/Ulcer Post Requires Further Debridement Debridement: Severity of Tissue Post Debridement: Fat layer exposed Post Procedure Diagnosis Same as Pre-procedure Electronic Signature(s) Signed: 08/21/2016 11:48:14 AM By: Christin Fudge MD, FACS Signed: 08/21/2016 4:21:12 PM By: Regan Lemming BSN, RN Entered By: Christin Fudge on 08/21/2016 11:48:14 [name], [name] ([id number]) [name], [name] ([id number]) -------------------------------------------------------------------------------- Debridement Details Patient Name: [name], [name]. Date of Service: 08/21/2016 11:15 AM Medical Record Number: [id number] Patient Account Number: [id number] Date of Birth/Sex: 1945-10-09 (71 y.o. Male) Treating RN: Afful, RN, BSN, [place] Primary Care Provider: [name] Dupre Other Clinician: Referring Provider: [name] Dupre Treating Provider/Extender: Frann Rider in Treatment: 1 Debridement Performed for Wound #2 Right,Medial Knee Assessment: Performed By: Physician Christin Fudge, MD Debridement: Debridement Pre-procedure Yes - 11:40 Verification/Time Out Taken: Start Time: 11:40 Pain Control: Lidocaine 4% Topical Solution Level: Skin/Subcutaneous Tissue Total Area Debrided (L x 1.5 (cm) x 2 (cm) = 3 (cm) W): Tissue and other Eschar, Fibrin/Slough, Subcutaneous material debrided: Instrument: Forceps, Scissors Bleeding: Minimum Hemostasis Achieved: Pressure End Time: 11:45 Procedural Pain: 0 Post Procedural Pain: 0 Response to Treatment: Procedure was tolerated well Post Debridement Measurements of Total Wound Length: (cm) 1.5 Width: (cm) 2 Depth: (cm) 0.2 Volume: (cm) 0.471 Character of Wound/Ulcer Post Requires Further Debridement Debridement: Severity of Tissue Post Debridement: Fat layer exposed Post Procedure Diagnosis Same as Pre-procedure Electronic  Signature(s) Signed: 08/21/2016 11:48:22 AM By: Christin Fudge MD, FACS Signed: 08/21/2016 4:21:12 PM By: Regan Lemming BSN, RN Entered By: Christin Fudge on 08/21/2016 11:48:22 [name], [name] ([id number]) [name], [name]. ([id number]) -------------------------------------------------------------------------------- HPI Details Patient Name: [name], [name]. Date of Service: 08/21/2016 11:15 AM Medical Record Number: [id number] Patient Account Number: [id number] Date of Birth/Sex: 04/11/1946 (71 y.o. Male) Treating RN: Baruch Gouty, RN, BSN, Velva Harman Primary Care Provider: [name] Dupre Other Clinician: Referring Provider: [name] Dupre Treating Provider/Extender: Frann Rider in Treatment: 1 History of Present Illness Location: right knee  swelling with skin ulceration Quality: Patient reports experiencing a dull pain to affected area(s). Severity: Patient states wound are getting worse. Duration: Patient has had the wound for <4 weeks prior to presenting for treatment Timing: Pain in wound is Intermittent (comes and goes Context: The wound occurred when the patient had a cast placed over his right lower extremity and with the cast was removed he had a swelling of the knee with problems with his skin Modifying Factors: Other treatment(s) tried include:orthopedics Associated Signs and Symptoms: swelling of both lower extremities with large amount of swelling of the knee HPI Description: 71 year old gentleman who has been living in an independent living facility for long-term care has had recurrent falls and recently had a large hematoma on the right knee. Known to be on aspirin and Plavix for history of CABG and was seen in the ER at the end of February after a fall. past medical history of arthritis, congestive heart failure, diabetes mellitus, hypertension, status post CABG o2, status post cholecystectomy, hernia repair and kidney surgery. recent workup for the fall showed negative  clinical findings in his extremities and it was thought that he was falling due to deconditioning. a x-ray showed a right fibular neck fracture and was placed in a splint. he was asked to be seen by orthopedic surgeon. the patient cannot tell me which orthopedic doctor had seen him and I'm not able to get any notes from [place], regarding his orthopedic care. The patient says twice during this last week he has had bloody fluid shooting out of his knee and has drained all over his boot and the flow. 08/21/2016 -- he was seen by the nurse practitioner at the facility and and distend she aspirated about 60 mL of hemorrhagic fluid from his right knee. Electronic Signature(s) Signed: 08/21/2016 11:49:00 AM By: Christin Fudge MD, FACS Entered By: Christin Fudge on 08/21/2016 11:48:59 [name], [name] ([id number]) -------------------------------------------------------------------------------- Physical Exam Details Patient Name: [name] Gathers [name]. Date of Service: 08/21/2016 11:15 AM Medical Record Number: [id number] Patient Account Number: [id number] Date of Birth/Sex: Aug 20, 1945 (71 y.o. Male) Treating RN: Baruch Gouty, RN, BSN, Velva Harman Primary Care Provider: [name] Dupre Other Clinician: Referring Provider: [name] Dupre Treating Provider/Extender: Frann Rider in Treatment: 1 Constitutional . Pulse regular. Respirations normal and unlabored. Afebrile. . Eyes Nonicteric. Reactive to light. Ears, Nose, Mouth, and Throat Lips, teeth, and gums WNL.[name] Moist mucosa without lesions. Neck supple and nontender. No palpable supraclavicular or cervical adenopathy. Normal sized without goiter. Respiratory WNL. No retractions.. Cardiovascular Pedal Pulses WNL. No clubbing, cyanosis or edema. Gastrointestinal (GI) Abdomen without masses or tenderness.. No liver or spleen enlargement or tenderness.. Lymphatic No adneopathy. No adenopathy. No adenopathy. Musculoskeletal Adexa without tenderness or  enlargement.. Digits and nails w/o clubbing, cyanosis, infection, petechiae, ischemia, or inflammatory conditions.. Integumentary (Hair, Skin) No suspicious lesions. No crepitus or fluctuance. No peri-wound warmth or erythema. No masses.[name] Psychiatric Judgement and insight Intact.. No evidence of depression, anxiety, or agitation.. Notes the right knee continues to be ballotable but less than what it was before. Using a forceps and scissors I have sharply removed some of the necrotic debris from 2 of the larger wounds. No bleeding noted today Electronic Signature(s) Signed: 08/21/2016 11:49:39 AM By: Christin Fudge MD, FACS Entered By: Christin Fudge on 08/21/2016 11:49:39 [name], [name] ([id number]) -------------------------------------------------------------------------------- Physician Orders Details Patient Name: [name] Gathers [name]. Date of Service: 08/21/2016 11:15 AM Medical Record Number: [id number] Patient Account Number: [id number] Date of Birth/Sex: [birth year] (71 y.o. Male) Treating  RN: Baruch Gouty, RN, BSN, [place] Primary Care Provider: [name] Dupre Other Clinician: Referring Provider: [name] Dupre Treating Provider/Extender: Frann Rider in Treatment: 1 Verbal / Phone Orders: No Diagnosis Coding Wound Cleansing Wound #1 Right,Anterior Knee o Cleanse wound with mild soap and water Wound #2 Right,Medial Knee o Cleanse wound with mild soap and water Wound #3 Right,Lateral Knee o Cleanse wound with mild soap and water Anesthetic Wound #1 Right,Anterior Knee o Topical Lidocaine 4% cream applied to wound bed prior to debridement - Only in the Wound Clinic Wound #2 Right,Medial Knee o Topical Lidocaine 4% cream applied to wound bed prior to debridement - Only in the Wound Clinic Wound #3 Right,Lateral Knee o Topical Lidocaine 4% cream applied to wound bed prior to debridement - Only in the Wound Clinic Primary Wound Dressing Wound #1 Right,Anterior  Knee o Santyl Ointment Wound #2 Right,Medial Knee o Santyl Ointment Wound #3 Right,Lateral Knee o Santyl Ointment Secondary Dressing Wound #1 Right,Anterior Knee o Gauze, ABD and Kerlix/Conform [name], [name]. ([id number]) Wound #2 Right,Medial Knee o Gauze, ABD and Kerlix/Conform Wound #3 Right,Lateral Knee o Gauze, ABD and Kerlix/Conform Dressing Change Frequency Wound #1 Right,Anterior Knee o Change dressing every day. Wound #2 Right,Medial Knee o Change dressing every day. Wound #3 Right,Lateral Knee o Change dressing every day. Follow-up Appointments Wound #1 Right,Anterior Knee o Return Appointment in 1 week. Wound #2 Right,Medial Knee o Return Appointment in 1 week. Wound #3 Right,Lateral Knee o Return Appointment in 1 week. Additional Orders / Instructions Wound #1 Right,Anterior Knee o Activity as tolerated o Other: - NEEDS TO BE SEEN THE ORTHOPEDIC FOR RE-EVALUATION OF RIGHT KNEE FOR POSSIBLE FLUID ASPIRATION. Wound #2 Right,Medial Knee o Activity as tolerated o Other: - NEEDS TO BE SEEN THE ORTHOPEDIC FOR RE-EVALUATION OF RIGHT KNEE FOR POSSIBLE FLUID ASPIRATION. Wound #3 Right,Lateral Knee o Activity as tolerated o Other: - NEEDS TO BE SEEN THE ORTHOPEDIC FOR RE-EVALUATION OF RIGHT KNEE FOR POSSIBLE FLUID ASPIRATION. Electronic Signature(s) Signed: 08/21/2016 4:07:16 PM By: Christin Fudge MD, FACS Signed: 08/21/2016 4:21:12 PM By: Regan Lemming BSN, RN Drenning, [name] ([id number]) Entered By: Regan Lemming on 08/21/2016 11:46:10 Musson, [name] ([id number]) -------------------------------------------------------------------------------- Problem List Details Patient Name: Kwiatek, Behr [name]. Date of Service: 08/21/2016 11:15 AM Medical Record Number: [id number] Patient Account Number: [id number] Date of Birth/Sex: 1946/01/26 (71 y.o. 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, right knee 08/14/2016 Yes L97.812 Non-pressure chronic ulcer of other part of right lower leg 08/14/2016 Yes with fat layer exposed L89.892 Pressure ulcer of other site, stage 2 08/14/2016 Yes M70.861 Other soft tissue disorders related to use, overuse and 08/14/2016 Yes pressure, right lower leg Inactive Problems Resolved Problems Electronic Signature(s) Signed: 08/21/2016 11:47:58 AM By: Christin Fudge MD, FACS Entered By: Christin Fudge on 08/21/2016 11:47:57 Beitz, [name] ([id number]) -------------------------------------------------------------------------------- Progress Note Details Patient Name: [name] Gathers [name]. Date of Service: 08/21/2016 11:15 AM Medical Record Number: [id number] Patient Account Number: [id number] Date of Birth/Sex: [birth year] (71 y.o. Male) Treating RN: Baruch Gouty, RN, BSN, Velva Harman Primary Care Provider: [name] Dupre Other Clinician: Referring Provider: [name] Dupre Treating Provider/Extender: Frann Rider in Treatment: 1 Subjective Chief Complaint Information obtained from Patient Patient is at the clinic for treatment of an open pressure ulcer to  the area of the right knee for about 3 weeks now History of Present Illness (HPI) The following HPI elements were documented for the patient's wound: Location: right knee swelling with skin ulceration Quality: Patient reports experiencing a dull pain to affected area(s). Severity: Patient states wound are getting worse. Duration: Patient has had the wound for Timing: Pain in wound is Intermittent (comes and goes Context: The wound occurred when the patient had a cast placed over his right lower extremity and with  the cast was removed he had a swelling of the knee with problems with his skin Modifying Factors: Other treatment(s) tried include:orthopedics Associated Signs and Symptoms: swelling of both lower extremities with large amount of swelling of the knee 71 year old gentleman who has been living in an independent living facility for long-term care has had recurrent falls and recently had a large hematoma on the right knee. Known to be on aspirin and Plavix for history of CABG and was seen in the ER at the end of February after a fall. past medical history of arthritis, congestive heart failure, diabetes mellitus, hypertension, status post CABG o2, status post cholecystectomy, hernia repair and kidney surgery. recent workup for the fall showed negative clinical findings in his extremities and it was thought that he was falling due to deconditioning. a x-ray showed a right fibular neck fracture and was placed in a splint. he was asked to be seen by orthopedic surgeon. the patient cannot tell me which orthopedic doctor had seen him and I'm not able to get any notes from [place], regarding his orthopedic care. The patient says twice during this last week he has had bloody fluid shooting out of his knee and has drained all over his boot and the flow. 08/21/2016 -- he was seen by the nurse practitioner at the facility and and distend she aspirated about 60 mL of hemorrhagic fluid from his right knee. Objective [name], [name]. ([id number]) Constitutional Pulse regular. Respirations normal and unlabored. Afebrile. Vitals Time Taken: 11:16 AM, Height: 67 in, Weight: 357 lbs, BMI: 55.9, Temperature: 98.1 F, Pulse: 84 bpm, Respiratory Rate: 18 breaths/min, Blood Pressure: 141/64 mmHg. Eyes Nonicteric. Reactive to light. Ears, Nose, Mouth, and Throat Lips, teeth, and gums WNL.[name] Moist mucosa without lesions. Neck supple and nontender. No palpable supraclavicular or cervical adenopathy. Normal sized  without goiter. Respiratory WNL. No retractions.. Cardiovascular Pedal Pulses WNL. No clubbing, cyanosis or edema. Gastrointestinal (GI) Abdomen without masses or tenderness.. No liver or spleen enlargement or tenderness.. Lymphatic No adneopathy. No adenopathy. No adenopathy. Musculoskeletal Adexa without tenderness or enlargement.. Digits and nails w/o clubbing, cyanosis, infection, petechiae, ischemia, or inflammatory conditions.[name] Psychiatric Judgement and insight Intact.. No evidence of depression, anxiety, or agitation.. General Notes: the right knee continues to be ballotable but less than what it was before. Using a forceps and scissors I have sharply removed some of the necrotic debris from 2 of the larger wounds. No bleeding noted today Integumentary (Hair, Skin) No suspicious lesions. No crepitus or fluctuance. No peri-wound warmth or erythema. No masses.. Wound #1 status is Open. Original cause of wound was Pressure Injury. The wound is located on the Right,Anterior Knee. The wound measures 2.4cm length x 4cm width x 0.1cm depth; 7.54cm^2 area and 0.754cm^3 volume. There is Fat Layer (Subcutaneous Tissue) Exposed exposed. There is no tunneling or undermining noted. There is a large amount of serosanguineous drainage noted. The wound margin is flat and intact. There is no granulation within the wound bed. There is a large (  67-100%) amount of necrotic tissue within the wound bed including Eschar and Adherent Slough. The periwound skin appearance exhibited: Erythema. The periwound skin appearance did not exhibit: Callus, Crepitus, Excoriation, Lattin, Joshawn [name]. ([id number]) Induration, Rash, Scarring, Dry/Scaly, Maceration, Atrophie Blanche, Cyanosis, Ecchymosis, Hemosiderin Staining, Mottled, Pallor, Rubor. The surrounding wound skin color is noted with erythema which is circumferential. Periwound temperature was noted as No Abnormality. The periwound has tenderness  on palpation. Wound #2 status is Open. Original cause of wound was Pressure Injury. The wound is located on the Right,Medial Knee. The wound measures 1.5cm length x 2cm width x 0.1cm depth; 2.356cm^2 area and 0.236cm^3 volume. There is Fat Layer (Subcutaneous Tissue) Exposed exposed. There is no tunneling or undermining noted. There is a large amount of serosanguineous drainage noted. The wound margin is flat and intact. There is no granulation within the wound bed. There is a large (67-100%) amount of necrotic tissue within the wound bed including Eschar and Adherent Slough. The periwound skin appearance exhibited: Scarring, Erythema. The periwound skin appearance did not exhibit: Callus, Crepitus, Excoriation, Induration, Rash, Dry/Scaly, Maceration, Atrophie Blanche, Cyanosis, Ecchymosis, Hemosiderin Staining, Mottled, Pallor, Rubor. The surrounding wound skin color is noted with erythema which is circumferential. Periwound temperature was noted as No Abnormality. The periwound has tenderness on palpation. Wound #3 status is Open. Original cause of wound was Pressure Injury. The wound is located on the Right,Lateral Knee. The wound measures 1.5cm length x 3cm width x 0.1cm depth; 3.534cm^2 area and 0.353cm^3 volume. There is Fat Layer (Subcutaneous Tissue) Exposed exposed. There is no tunneling or undermining noted. There is a large amount of serosanguineous drainage noted. The wound margin is distinct with the outline attached to the wound base. There is medium (34-66%) pink, pale granulation within the wound bed. There is a small (1-33%) amount of necrotic tissue within the wound bed including Adherent Slough. The periwound skin appearance exhibited: Erythema. The periwound skin appearance did not exhibit: Callus, Crepitus, Excoriation, Induration, Rash, Scarring, Dry/Scaly, Maceration, Atrophie Blanche, Cyanosis, Ecchymosis, Hemosiderin Staining, Mottled, Pallor, Rubor. The surrounding  wound skin color is noted with erythema which is circumferential. Periwound temperature was noted as No Abnormality. The periwound has tenderness on palpation. 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 ([id number]) Wound #1 Wound #1 is a Diabetic Wound/Ulcer of the Lower Extremity located on the Right,Anterior Knee . There was a Skin/Subcutaneous Tissue Debridement ([id number]) debridement with total area of 9.6 sq cm performed by Christin Fudge, MD. with the following instrument(s): Forceps and Scissors including Fibrin/Slough, Eschar, and Subcutaneous after achieving pain control using Lidocaine 4% Topical Solution. A time out was conducted at 11:40, prior to the start of the procedure. A Minimum amount of bleeding was controlled with Pressure. The procedure was tolerated well with a pain level of 0 throughout and a pain level of 0 following the procedure. Post Debridement Measurements: 2.4cm length x 4cm width x 0.2cm depth; 1.508cm^3 volume. Character of Wound/Ulcer Post Debridement requires further debridement. Severity of Tissue Post Debridement is: Fat layer exposed. Post procedure Diagnosis Wound #1: Same as Pre-Procedure Wound #2 Wound #2 is a Diabetic Wound/Ulcer of the Lower Extremity located on the Right,Medial  Knee . There was a Skin/Subcutaneous Tissue Debridement ([id number]) debridement with total area of 3 sq cm performed by Christin Fudge, MD. with the following instrument(s): Forceps and Scissors including Fibrin/Slough, Eschar, and Subcutaneous after achieving pain control using Lidocaine 4% Topical Solution. A time out  was conducted at 11:40, prior to the start of the procedure. A Minimum amount of bleeding was controlled with Pressure. The procedure was tolerated well with a pain level of 0 throughout and a pain level of 0 following the procedure. Post Debridement Measurements: 1.5cm length x 2cm width x 0.2cm depth; 0.471cm^3 volume. Character of Wound/Ulcer Post Debridement requires further debridement. Severity of Tissue Post Debridement is: Fat layer exposed. Post procedure Diagnosis Wound #2: Same as Pre-Procedure Plan Wound Cleansing: Wound #1 Right,Anterior Knee: Cleanse wound with mild soap and water Wound #2 Right,Medial Knee: Cleanse wound with mild soap and water Wound #3 Right,Lateral Knee: Cleanse wound with mild soap and water Anesthetic: Wound #1 Right,Anterior Knee: Topical Lidocaine 4% cream applied to wound bed prior to debridement - Only in the Wound Clinic Wound #2 Right,Medial Knee: Topical Lidocaine 4% cream applied to wound bed prior to debridement - Only in the Wound Clinic Wound #3 Right,Lateral Knee: Topical Lidocaine 4% cream applied to wound bed prior to debridement - Only in the Wound Clinic Primary Wound Dressing: INDALECIO, MALMSTROM ([id number]) Wound #1 Right,Anterior Knee: Santyl Ointment Wound #2 Right,Medial Knee: Santyl Ointment Wound #3 Right,Lateral Knee: Santyl Ointment Secondary Dressing: Wound #1 Right,Anterior Knee: Gauze, ABD and Kerlix/Conform Wound #2 Right,Medial Knee: Gauze, ABD and Kerlix/Conform Wound #3 Right,Lateral Knee: Gauze, ABD and Kerlix/Conform Dressing Change Frequency: Wound #1 Right,Anterior Knee: Change dressing every day. Wound #2 Right,Medial Knee: Change dressing every day. Wound #3 Right,Lateral Knee: Change dressing every day. Follow-up Appointments: Wound #1 Right,Anterior Knee: Return Appointment in 1 week. Wound #2 Right,Medial Knee: Return Appointment in 1 week. Wound #3 Right,Lateral Knee: Return Appointment in  1 week. Additional Orders / Instructions: Wound #1 Right,Anterior Knee: Activity as tolerated Other: - NEEDS TO BE SEEN THE ORTHOPEDIC FOR RE-EVALUATION OF RIGHT KNEE FOR POSSIBLE FLUID ASPIRATION. Wound #2 Right,Medial Knee: Activity as tolerated Other: - NEEDS TO BE SEEN THE ORTHOPEDIC FOR RE-EVALUATION OF RIGHT KNEE FOR POSSIBLE FLUID ASPIRATION. Wound #3 Right,Lateral Knee: Activity as tolerated Other: - NEEDS TO BE SEEN THE ORTHOPEDIC FOR RE-EVALUATION OF RIGHT KNEE FOR POSSIBLE FLUID ASPIRATION. after sharp debridement today I have recommended: 1. Santyl ointment locally to the wounds of the right knee and covered with a light Kerlix bandage. DEMETRIC, DUNNAWAY ([id number]) 2. he is going to be discharged home tomorrow and I have asked him to see his orthopedic surgeon regarding further care, for the ballotable swelling of his right knee. 3. Review back at the wound center next week Electronic Signature(s) Signed: 08/21/2016 11:51:16 AM By: Christin Fudge MD, FACS Entered By: Christin Fudge on 08/21/2016 11:51:15 Houser, [name] ([id number]) -------------------------------------------------------------------------------- SuperBill Details Patient Name: [name] Gathers [name]. Date of Service: 08/21/2016 Medical Record Number: [id number] Patient Account Number: [id number] Date of Birth/Sex: 1946-03-15 (71 y.o. 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 L89.892 Pressure ulcer of other site, stage 2 M70.861 Other soft tissue disorders  related to use, overuse and pressure, right lower leg Facility  Procedures CPT4: Description Modifier Quantity Code [id number] 11042 - DEB SUBQ TISSUE 20 SQ CM/< 1 ICD-10 Description Diagnosis 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, right knee L97.812 Non-pressure chronic ulcer of  other part of right lower leg with fat layer exposed M70.861 Other soft tissue disorders related to use, overuse and pressure, right lower leg Physician Procedures CPT4: Description Modifier Quantity Code [id number] 11042 - WC PHYS SUBQ TISS 20 SQ CM 1 ICD-10 Description Diagnosis 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 ([id number]) Electronic Signature(s) Signed: 08/21/2016 11:51:31 AM By: Christin Fudge MD, FACS Entered By: Christin Fudge on 08/21/2016 11:51:31

## 2016-08-22 NOTE — Progress Notes (Signed)
[name], [name] ([id number]) Visit Report for 08/21/2016 Arrival Information Details Patient Name: [name], [name]. Date of Service: 08/21/2016 11:15 AM Medical Record Number: [id number] Patient Account Number: [id number] Date of Birth/Sex: March 25, 1946 (71 y.o. Male) Treating RN: Baruch Gouty, RN, BSN, Velva Harman Primary Care Garlen Reinig: [name] Dupre Other Clinician: Referring Ovida Delagarza: [name] Dupre Treating Niajah Sipos/Extender: Frann Rider in Treatment: 1 Visit Information History Since Last Visit All ordered tests and consults were No Patient Arrived: Wheel completed: Chair Added or deleted any medications: No Arrival Time: 11:12 Any new allergies or adverse reactions: No Accompanied By: self Had a fall or experienced change in No Transfer Assistance: None activities of daily living that may affect Patient Identification Verified: Yes risk of falls: Secondary Verification Process Yes Signs or symptoms of abuse/neglect No Completed: since last visito Patient Requires Transmission-Based No Hospitalized since last visit: No Precautions: Has Dressing in Place as Prescribed: Yes Patient Has Alerts: No Has Footwear/Offloading in Place as Yes Prescribed: Right: Removable Cast Walker/Walking Boot Pain Present Now: No Electronic Signature(s) Signed: 08/21/2016 4:21:12 PM By: Regan Lemming BSN, RN Entered By: Regan Lemming on 08/21/2016 11:15:36 Riepe, [name] ([id number]) -------------------------------------------------------------------------------- Encounter Discharge Information Details Patient Name: [name] T. Date of Service: 08/21/2016 11:15 AM Medical Record Number: [id number] Patient Account Number: [id number] Date of Birth/Sex: 1945-07-31 (71 y.o. Male) Treating RN: Baruch Gouty, RN, BSN, Velva Harman Primary Care Marquetta Weiskopf: [name] Dupre Other Clinician: Referring Terrius Gentile: [name] Dupre Treating Deaisha Welborn/Extender: Frann Rider in Treatment: 1 Encounter  Discharge Information Items Discharge Pain Level: 0 Discharge Condition: Stable Ambulatory Status: Wheelchair Discharge Destination: Nursing Home Transportation: Other Accompanied By: self Schedule Follow-up Appointment: No Medication Reconciliation completed and provided to Patient/Care No Aarav Burgett: Provided on Clinical Summary of Care: 08/21/2016 Form Type Recipient Paper Patient JE Electronic Signature(s) Signed: 08/21/2016 4:21:12 PM By: Regan Lemming BSN, RN Previous Signature: 08/21/2016 11:47:17 AM Version By: Ruthine Dose Entered By: Regan Lemming on 08/21/2016 11:52:05 Senters, [name] ([id number]) -------------------------------------------------------------------------------- Lower Extremity Assessment Details Patient Name: [name] T. Date of Service: 08/21/2016 11:15 AM Medical Record Number: [id number] Patient Account Number: [id number] Date of Birth/Sex: Jun 27, 1945 (71 y.o. Male) Treating RN: Baruch Gouty, RN, BSN, Velva Harman Primary Care Andreka Stucki: [name] Dupre Other Clinician: Referring Ahlayah Tarkowski: [name] Dupre Treating Dorlis Judice/Extender: Frann Rider in Treatment: 1 Edema Assessment Assessed: Shirlyn Goltz: No] [Right: No] E[Left: dema] [Right: :] Calf Left: Right: Point of Measurement: 32 cm From Medial Instep cm cm Ankle Left: Right: Point of Measurement: 8 cm From Medial Instep cm cm Vascular Assessment Claudication: Claudication Assessment [Right:None] Pulses: Dorsalis Pedis Palpable: [Right:Yes] Posterior Tibial Extremity colors, hair growth, and conditions: Extremity Color: [Right:Normal] Hair Growth on Extremity: [Right:No] Temperature of Extremity: [Right:Warm] Capillary Refill: [Right:< 3 seconds] Electronic Signature(s) Signed: 08/21/2016 4:21:12 PM By: Regan Lemming BSN, RN Entered By: Regan Lemming on 08/21/2016 11:16:21 [name], [name] ([id number]) -------------------------------------------------------------------------------- Multi Wound  Chart Details Patient Name: [name] T. Date of Service: 08/21/2016 11:15 AM Medical Record Number: [id number] Patient Account Number: [id number] Date of Birth/Sex: 1946-01-16 (71 y.o. 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] [2:the Lower Extremity] [3:the Lower Extremity] Comorbid History: [1:Anemia, Asthma, Arrhythmia, Congestive Arrhythmia, Congestive Arrhythmia, Congestive Heart Failure, Coronary Heart Failure, Coronary Heart Failure, Coronary Artery Disease, Hypertension, Type II Diabetes, Osteoarthritis Diabetes,  Osteoarthritis Diabetes, Osteoarthritis] [2:Anemia, Asthma, Artery Disease, Hypertension, Type II] [3:Anemia, Asthma, Artery Disease, Hypertension, Type II] Date Acquired: [1:07/23/2016] [2:07/23/2016] [3:07/23/2016] Weeks of Treatment: [1:1] [2:1] [3:1] Wound Status: [1:Open] [2:Open] [3:Open] Measurements L x W x D 2.4x4x0.1 [2:1.5x2x0.1] [3:1.5x3x0.1] (cm) Area (cm) : [1:7.54] [2:2.356] [3:3.534] Volume (cm) : [1:0.754] [2:0.236] [3:0.353] % Reduction in Area: [1:4.00%] [2:25.00%] [3:38.00%] % Reduction in Volume: 3.90% [2:24.80%] [3:38.10%] Classification: [1:Unable to visualize wound Unable to visualize wound Grade 1 bed] [2:bed] Exudate Amount: [1:Large] [2:Large] [3:Large] Exudate Type:  [1:Serosanguineous] [2:Serosanguineous] [3:Serosanguineous] Exudate Color: [1:[name], brown] [2:[name], brown] [3:[name], brown] Wound Margin: [1:Flat and Intact] [2:Flat and Intact] [3:Distinct, outline attached] Granulation Amount: [1:None Present (0%)] [2:None Present (0%)] [3:Medium (34-66%)] Granulation Quality: [1:N/A] [2:N/A] [3:Pink, Pale] Necrotic Amount: [1:Large (67-100%)] [2:Large (67-100%)] [3:Small (1-33%)] Necrotic Tissue: [1:Eschar, Adherent Slough Eschar, Adherent Slough Adherent Slough] Exposed Structures: Fat Layer (Subcutaneous Fat Layer (Subcutaneous Fat Layer (Subcutaneous Tissue) Exposed: Yes Tissue) Exposed: Yes Tissue) Exposed: Yes Fascia: No Fascia: No Fascia: No Tendon: No Tendon: No Tendon: No Muscle: No Muscle: No Muscle: No Joint: No Joint: No Joint: No Bone: No Bone: No Bone: No Epithelialization: None None None Debridement: Debridement ([id number] Debridement ([id number] N/A 11047) 11047) Pre-procedure 11:40 11:40 N/A Verification/Time Out Taken: Pain Control: Lidocaine 4% Topical Lidocaine 4% Topical N/A Solution Solution Tissue Debrided: Necrotic/Eschar, Necrotic/Eschar, N/A Fibrin/Slough, Fibrin/Slough, Subcutaneous Subcutaneous Level: Skin/Subcutaneous Skin/Subcutaneous N/A Tissue Tissue Debridement Area (sq 9.6 3 N/A cm): Instrument: Forceps, Scissors Forceps, Scissors N/A Bleeding: Minimum Minimum N/A Hemostasis Achieved: Pressure Pressure N/A Procedural Pain: 0 0 N/A Post Procedural Pain: 0 0 N/A Debridement Treatment Procedure was tolerated Procedure was tolerated N/A Response: well well Post Debridement 2.4x4x0.2 1.5x2x0.2 N/A Measurements L x W x D (cm) Post Debridement 1.508 0.471 N/A Volume: (cm) Periwound Skin Texture: Excoriation: No Scarring: Yes Excoriation: No Induration: No Excoriation: No Induration: No Callus: No Induration: No Callus: No Crepitus: No Callus: No Crepitus: No Rash: No Crepitus: No Rash: No Scarring:  No Rash: No Scarring: No Periwound Skin Maceration: No Maceration: No Maceration: No Moisture: Dry/Scaly: No Dry/Scaly: No Dry/Scaly: No Periwound Skin Color: Erythema: Yes Erythema: Yes Erythema: Yes Atrophie Blanche: No Atrophie Blanche: No Atrophie Blanche: No Cyanosis: No Cyanosis: No Cyanosis: No Ecchymosis: No Ecchymosis: No Ecchymosis: No Hemosiderin Staining: No Hemosiderin Staining: No Hemosiderin Staining: No Mottled: No Mottled: No Mottled: No Pallor: No Pallor: No Pallor: No Rubor: No Rubor: No Rubor: No [name], [name] ([id number]) Erythema Location: Circumferential Circumferential Circumferential Temperature: No Abnormality No Abnormality No Abnormality Tenderness on Yes Yes Yes Palpation: Wound Preparation: Ulcer Cleansing: Ulcer Cleansing: Ulcer Cleansing: Rinsed/Irrigated with Rinsed/Irrigated with Rinsed/Irrigated with Saline Saline Saline Topical Anesthetic Topical Anesthetic Topical Anesthetic Applied: Other: Lidocaine Applied: Other: Lidocaine Applied: Other: lidocain 4% 4% 4% Procedures Performed: Debridement Debridement N/A Treatment Notes Electronic Signature(s) Signed: 08/21/2016 11:48:05 AM By: Christin Fudge MD, FACS Entered By: Christin Fudge on 08/21/2016 11:48:05 [name], [name] ([id number]) -------------------------------------------------------------------------------- [place] Details Patient Name: [name] T. Date of Service: 08/21/2016 11:15 AM Medical Record Number: [id number] Patient Account Number: [id number] Date of Birth/Sex: Oct 06, 1945 (71 y.o. Male) Treating RN: Baruch Gouty, RN, BSN, Velva Harman Primary Care Eldor Conaway: [name] Dupre Other Clinician: Referring Aricka Goldberger: [name] Dupre Treating Oskar Cretella/Extender: Frann Rider in Treatment: 1  Active Inactive ` Orientation to the Wound Care Program Nursing Diagnoses: Knowledge deficit related to the wound healing center  program Goals: Patient/caregiver will verbalize understanding of the [place] Date Initiated: 08/14/2016 Target Resolution Date: 12/14/2016 Goal Status: Active Interventions: Provide education on orientation to the wound center Notes: ` Pressure Nursing Diagnoses: Knowledge deficit related to causes and risk factors for pressure ulcer development Knowledge deficit related to management of pressures ulcers Potential for impaired tissue integrity related to pressure, friction, moisture, and shear Goals: Patient will remain free from development of additional pressure ulcers Date Initiated: 08/14/2016 Target Resolution Date: 12/14/2016 Goal Status: Active Patient will remain free of pressure ulcers Date Initiated: 08/14/2016 Target Resolution Date: 12/14/2016 Goal Status: Active Patient/caregiver will verbalize risk factors for pressure ulcer development Date Initiated: 08/14/2016 Target Resolution Date: 12/14/2016 Goal Status: Active Patient/caregiver will verbalize understanding of pressure ulcer management Date Initiated: 08/14/2016 Target Resolution Date: 12/14/2016 Goal Status: Active HALFORD, GOETZKE ([id number]) Interventions: Assess: immobility, friction, shearing, incontinence upon admission and as needed Assess offloading mechanisms upon admission and as needed Assess potential for pressure ulcer upon admission and as needed Provide education on pressure ulcers Treatment Activities: Patient referred for seating evaluation to ensure proper offloading : 08/14/2016 Pressure reduction/relief device ordered : 08/14/2016 Notes: ` Wound/Skin Impairment Nursing Diagnoses: Impaired tissue integrity Knowledge deficit related to ulceration/compromised skin integrity Goals: Patient/caregiver will verbalize understanding of skin care regimen Date Initiated: 08/14/2016 Target Resolution Date: 12/14/2016 Goal Status: Active Ulcer/skin breakdown will have a volume  reduction of 30% by week 4 Date Initiated: 08/14/2016 Target Resolution Date: 12/14/2016 Goal Status: Active Ulcer/skin breakdown will have a volume reduction of 50% by week 8 Date Initiated: 08/14/2016 Target Resolution Date: 12/14/2016 Goal Status: Active Ulcer/skin breakdown will have a volume reduction of 80% by week 12 Date Initiated: 08/14/2016 Target Resolution Date: 12/14/2016 Goal Status: Active Ulcer/skin breakdown will heal within 14 weeks Date Initiated: 08/14/2016 Target Resolution Date: 12/14/2016 Goal Status: Active Interventions: Assess patient/caregiver ability to obtain necessary supplies Assess patient/caregiver ability to perform ulcer/skin care regimen upon admission and as needed Assess ulceration(s) every visit Provide education on ulcer and skin care Treatment Activities: Referred to DME Estephani Popper for dressing supplies : 08/14/2016 Skin care regimen initiated : 08/14/2016 [name], [name] ([id number]) Topical wound management initiated : 08/14/2016 Notes: Electronic Signature(s) Signed: 08/21/2016 4:21:12 PM By: Regan Lemming BSN, RN Entered By: Regan Lemming on 08/21/2016 11:39:54 Rhines, [name] ([id number]) -------------------------------------------------------------------------------- Pain Assessment Details Patient Name: [name] T. Date of Service: 08/21/2016 11:15 AM Medical Record Number: [id number] Patient Account Number: [id number] Date of Birth/Sex: 09/02/1945 (71 y.o. Male) Treating RN: Baruch Gouty, RN, BSN, Velva Harman Primary Care Jadyn Barge: [name] Dupre Other Clinician: Referring Loukisha Gunnerson: [name] Dupre Treating Haely Leyland/Extender: Frann Rider in Treatment: 1 Active Problems Location of Pain Severity and Description of Pain Patient Has Paino No Site Locations With Dressing Change: No Pain Management and Medication Current Pain Management: Electronic Signature(s) Signed: 08/21/2016 4:21:12 PM By: Regan Lemming BSN, RN Entered By: Regan Lemming on 08/21/2016 11:15:43 Callanan, [name] ([id number]) -------------------------------------------------------------------------------- Patient/Caregiver Education Details Patient Name: [name]. Date of Service: 08/21/2016 11:15 AM Medical Record Number: [id number] Patient Account Number: [id number] Date of Birth/Gender: 08-08-1945 (71 y.o. Male) Treating RN: Baruch Gouty, RN, BSN, Velva Harman Primary Care Physician: [name] Dupre Other Clinician: Referring Physician: [name] Dupre Treating Physician/Extender: Frann Rider in Treatment: 1 Education Assessment Education Provided To: Patient Education Topics Provided Pressure: Methods: Explain/Verbal Responses: State content correctly Welcome To The  Wound Care Center: Methods: Explain/Verbal Responses: State content correctly Wound/Skin Impairment: Methods: Explain/Verbal Responses: State content correctly Electronic Signature(s) Signed: 08/21/2016 4:21:12 PM By: Regan Lemming BSN, RN Entered By: Regan Lemming on 08/21/2016 11:52:20 Stumpo, [name] ([id number]) -------------------------------------------------------------------------------- Wound Assessment Details Patient Name: [name], [name] Date of Service: 08/21/2016 11:15 AM Medical Record Number: [id number] Patient Account Number: [id number] Date of Birth/Sex: 1946/04/03 (71 y.o. Male) Treating RN: Baruch Gouty, RN, BSN, [place] Primary Care Adena Sima: [name] Dupre Other Clinician: Referring Treshun Wold: [name] Dupre Treating Jaret Coppedge/Extender: Frann Rider in Treatment: 1 Wound Status Wound Number: 1 Primary Diabetic Wound/Ulcer of the Lower Etiology: Extremity Wound Location: Right Knee - Anterior Wound Open Wounding Event: Pressure Injury Status: Date Acquired: 07/23/2016 Comorbid Anemia, Asthma, Arrhythmia, Weeks Of Treatment: 1 History: Congestive Heart Failure, Coronary Clustered Wound: No Artery Disease, Hypertension, Type II Diabetes,  Osteoarthritis Photos Photo Uploaded By: Regan Lemming on 08/21/2016 16:17:49 Wound Measurements Length: (cm) 2.4 Width: (cm) 4 Depth: (cm) 0.1 Area: (cm) 7.54 Volume: (cm) 0.754 % Reduction in Area: 4% % Reduction in Volume: 3.9% Epithelialization: None Tunneling: No Undermining: No Wound Description Classification: Unable to visualize wound bed Wound Margin: Flat and Intact Exudate Amount: Large Exudate Type: Serosanguineous Exudate Color: [name], brown Foul Odor After Cleansing: No Slough/Fibrino Yes Wound Bed Granulation Amount: None Present (0%) Exposed Structure Necrotic Amount: Large (67-100%) Fascia Exposed: No Necrotic Quality: Eschar, Adherent Slough Fat Layer (Subcutaneous Tissue) Exposed: Yes [name], [name] ([id number]) Tendon Exposed: No Muscle Exposed: No Joint Exposed: No Bone Exposed: No Periwound Skin Texture Texture Color No Abnormalities Noted: No No Abnormalities Noted: No Callus: No Atrophie Blanche: No Crepitus: No Cyanosis: No Excoriation: No Ecchymosis: No Induration: No Erythema: Yes Rash: No Erythema Location: Circumferential Scarring: No Hemosiderin Staining: No Mottled: No Moisture Pallor: No No Abnormalities Noted: No Rubor: No Dry / Scaly: No Maceration: No Temperature / Pain Temperature: No Abnormality Tenderness on Palpation: Yes Wound Preparation Ulcer Cleansing: Rinsed/Irrigated with Saline Topical Anesthetic Applied: Other: Lidocaine 4%, Treatment Notes Wound #1 (Right, Anterior Knee) 1. Cleansed with: Clean wound with Normal Saline 4. Dressing Applied: Santyl Ointment 5. Secondary Dressing Applied Guaze, ABD and kerlix/Conform 7. Secured with Recruitment consultant) Signed: 08/21/2016 4:21:12 PM By: Regan Lemming BSN, RN Entered By: Regan Lemming on 08/21/2016 11:24:48 [name] ([id number]) -------------------------------------------------------------------------------- Wound Assessment  Details Patient Name: [name] T. Date of Service: 08/21/2016 11:15 AM Medical Record Number: [id number] Patient Account Number: [id number] Date of Birth/Sex: Sep 06, 1945 (71 y.o. Male) Treating RN: Baruch Gouty, RN, BSN, Velva Harman Primary Care Eliam Snapp: [name] Dupre Other Clinician: Referring Elai Vanwyk: [name] Dupre Treating Asli Tokarski/Extender: Frann Rider in Treatment: 1 Wound Status Wound Number: 2 Primary Diabetic Wound/Ulcer of the Lower Etiology: Extremity Wound Location: Right Knee - Medial Wound Open Wounding Event: Pressure Injury Status: Date Acquired: 07/23/2016 Comorbid Anemia, Asthma, Arrhythmia, Weeks Of Treatment: 1 History: Congestive Heart Failure, Coronary Clustered Wound: No Artery Disease, Hypertension, Type II Diabetes, Osteoarthritis Photos Photo Uploaded By: Regan Lemming on 08/21/2016 16:17:49 Wound Measurements Length: (cm) 1.5 Width: (cm) 2 Depth: (cm) 0.1 Area: (cm) 2.356 Volume: (cm) 0.236 % Reduction in Area: 25% % Reduction in Volume: 24.8% Epithelialization: None Tunneling: No Undermining: No Wound Description Classification: Unable to visualize wound bed Wound Margin: Flat and Intact Exudate Amount: Large Exudate Type: Serosanguineous Exudate Color: [name], brown Foul Odor After Cleansing: No Slough/Fibrino Yes Wound Bed Granulation Amount: None Present (0%) Exposed Structure Necrotic Amount: Large (67-100%) Fascia Exposed: No Necrotic Quality: Eschar, Adherent Slough Fat Layer (Subcutaneous  Tissue) Exposed: Yes [name], [name] ([id number]) Tendon Exposed: No Muscle Exposed: No Joint Exposed: No Bone Exposed: No Periwound Skin Texture Texture Color No Abnormalities Noted: No No Abnormalities Noted: No Callus: No Atrophie Blanche: No Crepitus: No Cyanosis: No Excoriation: No Ecchymosis: No Induration: No Erythema: Yes Rash: No Erythema Location: Circumferential Scarring: Yes Hemosiderin Staining: No Mottled:  No Moisture Pallor: No No Abnormalities Noted: No Rubor: No Dry / Scaly: No Maceration: No Temperature / Pain Temperature: No Abnormality Tenderness on Palpation: Yes Wound Preparation Ulcer Cleansing: Rinsed/Irrigated with Saline Topical Anesthetic Applied: Other: Lidocaine 4%, Treatment Notes Wound #2 (Right, Medial Knee) 1. Cleansed with: Clean wound with Normal Saline 4. Dressing Applied: Santyl Ointment 5. Secondary Dressing Applied Guaze, ABD and kerlix/Conform 7. Secured with Recruitment consultant) Signed: 08/21/2016 4:21:12 PM By: Regan Lemming BSN, RN Entered By: Regan Lemming on 08/21/2016 11:25:13 [name], [name] ([id number]) -------------------------------------------------------------------------------- Wound Assessment Details Patient Name: [name] T. Date of Service: 08/21/2016 11:15 AM Medical Record Number: [id number] Patient Account Number: [id number] Date of Birth/Sex: [birth year] (71 y.o. Male) Treating RN: Baruch Gouty, RN, BSN, [place] Primary Care Terisa Belardo: [name] Dupre Other Clinician: Referring Mychele Seyller: [name] Dupre Treating Afrika Brick/Extender: Frann Rider in Treatment: 1 Wound Status Wound Number: 3 Primary Diabetic Wound/Ulcer of the Lower Etiology: Extremity Wound Location: Right Knee - Lateral Wound Open Wounding Event: Pressure Injury Status: Date Acquired: 07/23/2016 Comorbid Anemia, Asthma, Arrhythmia, Weeks Of Treatment: 1 History: Congestive Heart Failure, Coronary Clustered Wound: No Artery Disease, Hypertension, Type II Diabetes, Osteoarthritis Photos Photo Uploaded By: Regan Lemming on 08/21/2016 16:18:19 Wound Measurements Length: (cm) 1.5 Width: (cm) 3 Depth: (cm) 0.1 Area: (cm) 3.534 Volume: (cm) 0.353 % Reduction in Area: 38% % Reduction in Volume: 38.1% Epithelialization: None Tunneling: No Undermining: No Wound Description Classification: Grade 1 Foul Odor Aft Wound Margin: Distinct, outline  attached Slough/Fibrin Exudate Amount: Large Exudate Type: Serosanguineous Exudate Color: [name], brown er Cleansing: No o Yes Wound Bed Granulation Amount: Medium (34-66%) Exposed Structure Granulation Quality: Pink, Pale Fascia Exposed: No Necrotic Amount: Small (1-33%) Fat Layer (Subcutaneous Tissue) Exposed: Yes Welch, Naitik T. ([id number]) Necrotic Quality: Adherent Slough Tendon Exposed: No Muscle Exposed: No Joint Exposed: No Bone Exposed: No Periwound Skin Texture Texture Color No Abnormalities Noted: No No Abnormalities Noted: No Callus: No Atrophie Blanche: No Crepitus: No Cyanosis: No Excoriation: No Ecchymosis: No Induration: No Erythema: Yes Rash: No Erythema Location: Circumferential Scarring: No Hemosiderin Staining: No Mottled: No Moisture Pallor: No No Abnormalities Noted: No Rubor: No Dry / Scaly: No Maceration: No Temperature / Pain Temperature: No Abnormality Tenderness on Palpation: Yes Wound Preparation Ulcer Cleansing: Rinsed/Irrigated with Saline Topical Anesthetic Applied: Other: lidocain 4%, Treatment Notes Wound #3 (Right, Lateral Knee) 1. Cleansed with: Clean wound with Normal Saline 4. Dressing Applied: Santyl Ointment 5. Secondary Dressing Applied Guaze, ABD and kerlix/Conform 7. Secured with Recruitment consultant) Signed: 08/21/2016 4:21:12 PM By: Regan Lemming BSN, RN Entered By: Regan Lemming on 08/21/2016 11:25:51 Bucio, [name] ([id number]) -------------------------------------------------------------------------------- Vitals Details Patient Name: [name] T. Date of Service: 08/21/2016 11:15 AM Medical Record Number: [id number] Patient Account Number: [id number] Date of Birth/Sex: December 04, 1945 (71 y.o. Male) Treating RN: Afful, RN, BSN, [place] Primary Care Jerzee Jerome: [name] Dupre Other Clinician: Referring Eduardo Honor: [name] Dupre Treating Tychelle Purkey/Extender: Frann Rider in Treatment: 1 Vital  Signs Time Taken: 11:16 Temperature (F): 98.1 Height (in): 67 Pulse (bpm): 84 Weight (lbs): 357 Respiratory Rate (breaths/min): 18 Body Mass Index (BMI): 55.9 Blood Pressure (mmHg): 141/64 Reference Range: 80 -  120 mg / dl Electronic Signature(s) Signed: 08/21/2016 4:21:12 PM By: Regan Lemming BSN, RN Entered By: Regan Lemming on 08/21/2016 11:19:11

## 2016-08-23 ENCOUNTER — Emergency Department: Payer: Medicare Other

## 2016-08-23 ENCOUNTER — Inpatient Hospital Stay (HOSPITAL_COMMUNITY): Payer: Medicare Other

## 2016-08-23 ENCOUNTER — Inpatient Hospital Stay (HOSPITAL_COMMUNITY)
Admission: EM | Admit: 2016-08-23 | Discharge: 2016-08-27 | DRG: 175 | Disposition: A | Discharge status: Skilled Nursing Facility | Payer: Medicare Other | Source: Other Acute Inpatient Hospital | Attending: Internal Medicine | Admitting: Internal Medicine

## 2016-08-23 ENCOUNTER — Inpatient Hospital Stay
Admission: EM | Admit: 2016-08-23 | Discharge: 2016-08-23 | DRG: 176 | Disposition: A | Discharge status: Other Acute Inpatient Hospital | Payer: Medicare Other | Attending: Emergency Medicine | Admitting: Emergency Medicine

## 2016-08-23 ENCOUNTER — Encounter: Payer: Self-pay | Admitting: Emergency Medicine

## 2016-08-23 DIAGNOSIS — Z8711 Personal history of peptic ulcer disease: Secondary | ICD-10-CM

## 2016-08-23 DIAGNOSIS — Z880 Allergy status to penicillin: Secondary | ICD-10-CM | POA: Diagnosis not present

## 2016-08-23 DIAGNOSIS — Z7982 Long term (current) use of aspirin: Secondary | ICD-10-CM

## 2016-08-23 DIAGNOSIS — G4733 Obstructive sleep apnea (adult) (pediatric): Secondary | ICD-10-CM | POA: Diagnosis present

## 2016-08-23 DIAGNOSIS — I5032 Chronic diastolic (congestive) heart failure: Secondary | ICD-10-CM | POA: Diagnosis present

## 2016-08-23 DIAGNOSIS — J449 Chronic obstructive pulmonary disease, unspecified: Secondary | ICD-10-CM | POA: Diagnosis present

## 2016-08-23 DIAGNOSIS — Z8249 Family history of ischemic heart disease and other diseases of the circulatory system: Secondary | ICD-10-CM | POA: Diagnosis not present

## 2016-08-23 DIAGNOSIS — I2489 Other forms of acute ischemic heart disease: Secondary | ICD-10-CM | POA: Diagnosis present

## 2016-08-23 DIAGNOSIS — J9621 Acute and chronic respiratory failure with hypoxia: Secondary | ICD-10-CM | POA: Diagnosis present

## 2016-08-23 DIAGNOSIS — Z882 Allergy status to sulfonamides status: Secondary | ICD-10-CM

## 2016-08-23 DIAGNOSIS — Z881 Allergy status to other antibiotic agents status: Secondary | ICD-10-CM | POA: Diagnosis not present

## 2016-08-23 DIAGNOSIS — Z7951 Long term (current) use of inhaled steroids: Secondary | ICD-10-CM

## 2016-08-23 DIAGNOSIS — E1169 Type 2 diabetes mellitus with other specified complication: Secondary | ICD-10-CM | POA: Diagnosis not present

## 2016-08-23 DIAGNOSIS — R7989 Other specified abnormal findings of blood chemistry: Secondary | ICD-10-CM

## 2016-08-23 DIAGNOSIS — I130 Hypertensive heart and chronic kidney disease with heart failure and stage 1 through stage 4 chronic kidney disease, or unspecified chronic kidney disease: Secondary | ICD-10-CM | POA: Diagnosis not present

## 2016-08-23 DIAGNOSIS — Z87898 Personal history of other specified conditions: Secondary | ICD-10-CM | POA: Diagnosis not present

## 2016-08-23 DIAGNOSIS — E118 Type 2 diabetes mellitus with unspecified complications: Secondary | ICD-10-CM | POA: Diagnosis present

## 2016-08-23 DIAGNOSIS — I10 Essential (primary) hypertension: Secondary | ICD-10-CM | POA: Diagnosis present

## 2016-08-23 DIAGNOSIS — N179 Acute kidney failure, unspecified: Secondary | ICD-10-CM

## 2016-08-23 DIAGNOSIS — I82443 Acute embolism and thrombosis of tibial vein, bilateral: Secondary | ICD-10-CM | POA: Diagnosis not present

## 2016-08-23 DIAGNOSIS — I82409 Acute embolism and thrombosis of unspecified deep veins of unspecified lower extremity: Secondary | ICD-10-CM | POA: Diagnosis not present

## 2016-08-23 DIAGNOSIS — Z951 Presence of aortocoronary bypass graft: Secondary | ICD-10-CM | POA: Diagnosis not present

## 2016-08-23 DIAGNOSIS — S82401D Unspecified fracture of shaft of right fibula, subsequent encounter for closed fracture with routine healing: Secondary | ICD-10-CM

## 2016-08-23 DIAGNOSIS — R0902 Hypoxemia: Secondary | ICD-10-CM | POA: Diagnosis present

## 2016-08-23 DIAGNOSIS — Z7902 Long term (current) use of antithrombotics/antiplatelets: Secondary | ICD-10-CM

## 2016-08-23 DIAGNOSIS — I82433 Acute embolism and thrombosis of popliteal vein, bilateral: Secondary | ICD-10-CM | POA: Diagnosis not present

## 2016-08-23 DIAGNOSIS — Z8709 Personal history of other diseases of the respiratory system: Secondary | ICD-10-CM

## 2016-08-23 DIAGNOSIS — E878 Other disorders of electrolyte and fluid balance, not elsewhere classified: Secondary | ICD-10-CM

## 2016-08-23 DIAGNOSIS — J9620 Acute and chronic respiratory failure, unspecified whether with hypoxia or hypercapnia: Secondary | ICD-10-CM | POA: Diagnosis present

## 2016-08-23 DIAGNOSIS — Z8659 Personal history of other mental and behavioral disorders: Secondary | ICD-10-CM

## 2016-08-23 DIAGNOSIS — I2699 Other pulmonary embolism without acute cor pulmonale: Secondary | ICD-10-CM | POA: Diagnosis present

## 2016-08-23 DIAGNOSIS — R6510 Systemic inflammatory response syndrome (SIRS) of non-infectious origin without acute organ dysfunction: Secondary | ICD-10-CM

## 2016-08-23 DIAGNOSIS — Z833 Family history of diabetes mellitus: Secondary | ICD-10-CM | POA: Diagnosis not present

## 2016-08-23 DIAGNOSIS — E872 Acidosis, unspecified: Secondary | ICD-10-CM | POA: Diagnosis present

## 2016-08-23 DIAGNOSIS — E119 Type 2 diabetes mellitus without complications: Secondary | ICD-10-CM

## 2016-08-23 DIAGNOSIS — Z885 Allergy status to narcotic agent status: Secondary | ICD-10-CM | POA: Diagnosis not present

## 2016-08-23 DIAGNOSIS — I2609 Other pulmonary embolism with acute cor pulmonale: Secondary | ICD-10-CM | POA: Diagnosis not present

## 2016-08-23 DIAGNOSIS — N189 Chronic kidney disease, unspecified: Secondary | ICD-10-CM

## 2016-08-23 DIAGNOSIS — Z79899 Other long term (current) drug therapy: Secondary | ICD-10-CM

## 2016-08-23 DIAGNOSIS — I2510 Atherosclerotic heart disease of native coronary artery without angina pectoris: Secondary | ICD-10-CM | POA: Diagnosis present

## 2016-08-23 DIAGNOSIS — E039 Hypothyroidism, unspecified: Secondary | ICD-10-CM | POA: Diagnosis present

## 2016-08-23 DIAGNOSIS — I2602 Saddle embolus of pulmonary artery with acute cor pulmonale: Secondary | ICD-10-CM | POA: Diagnosis not present

## 2016-08-23 DIAGNOSIS — G8929 Other chronic pain: Secondary | ICD-10-CM | POA: Diagnosis present

## 2016-08-23 DIAGNOSIS — E1165 Type 2 diabetes mellitus with hyperglycemia: Secondary | ICD-10-CM | POA: Diagnosis not present

## 2016-08-23 DIAGNOSIS — D6489 Other specified anemias: Secondary | ICD-10-CM | POA: Diagnosis present

## 2016-08-23 DIAGNOSIS — I248 Other forms of acute ischemic heart disease: Secondary | ICD-10-CM | POA: Diagnosis present

## 2016-08-23 DIAGNOSIS — N184 Chronic kidney disease, stage 4 (severe): Secondary | ICD-10-CM | POA: Diagnosis not present

## 2016-08-23 DIAGNOSIS — Z87891 Personal history of nicotine dependence: Secondary | ICD-10-CM | POA: Diagnosis not present

## 2016-08-23 DIAGNOSIS — Z6841 Body Mass Index (BMI) 40.0 and over, adult: Secondary | ICD-10-CM

## 2016-08-23 DIAGNOSIS — T82838A Hemorrhage of vascular prosthetic devices, implants and grafts, initial encounter: Secondary | ICD-10-CM | POA: Diagnosis not present

## 2016-08-23 DIAGNOSIS — R739 Hyperglycemia, unspecified: Secondary | ICD-10-CM | POA: Diagnosis not present

## 2016-08-23 DIAGNOSIS — Z9049 Acquired absence of other specified parts of digestive tract: Secondary | ICD-10-CM | POA: Diagnosis not present

## 2016-08-23 DIAGNOSIS — F319 Bipolar disorder, unspecified: Secondary | ICD-10-CM | POA: Diagnosis not present

## 2016-08-23 DIAGNOSIS — D638 Anemia in other chronic diseases classified elsewhere: Secondary | ICD-10-CM | POA: Diagnosis present

## 2016-08-23 DIAGNOSIS — I82411 Acute embolism and thrombosis of right femoral vein: Secondary | ICD-10-CM | POA: Diagnosis present

## 2016-08-23 DIAGNOSIS — R778 Other specified abnormalities of plasma proteins: Secondary | ICD-10-CM | POA: Diagnosis present

## 2016-08-23 DIAGNOSIS — R748 Abnormal levels of other serum enzymes: Secondary | ICD-10-CM | POA: Diagnosis not present

## 2016-08-23 DIAGNOSIS — Z8673 Personal history of transient ischemic attack (TIA), and cerebral infarction without residual deficits: Secondary | ICD-10-CM

## 2016-08-23 DIAGNOSIS — Z8719 Personal history of other diseases of the digestive system: Secondary | ICD-10-CM

## 2016-08-23 DIAGNOSIS — E1140 Type 2 diabetes mellitus with diabetic neuropathy, unspecified: Secondary | ICD-10-CM | POA: Diagnosis present

## 2016-08-23 DIAGNOSIS — L8990 Pressure ulcer of unspecified site, unspecified stage: Secondary | ICD-10-CM | POA: Diagnosis present

## 2016-08-23 DIAGNOSIS — Z888 Allergy status to other drugs, medicaments and biological substances status: Secondary | ICD-10-CM

## 2016-08-23 DIAGNOSIS — L97819 Non-pressure chronic ulcer of other part of right lower leg with unspecified severity: Secondary | ICD-10-CM | POA: Diagnosis present

## 2016-08-23 DIAGNOSIS — Z22322 Carrier or suspected carrier of Methicillin resistant Staphylococcus aureus: Secondary | ICD-10-CM

## 2016-08-23 DIAGNOSIS — N183 Chronic kidney disease, stage 3 (moderate): Secondary | ICD-10-CM | POA: Diagnosis not present

## 2016-08-23 DIAGNOSIS — E1122 Type 2 diabetes mellitus with diabetic chronic kidney disease: Secondary | ICD-10-CM | POA: Diagnosis not present

## 2016-08-23 HISTORY — DX: Other pulmonary embolism without acute cor pulmonale: I26.99

## 2016-08-23 HISTORY — DX: Unspecified asthma, uncomplicated: J45.909

## 2016-08-23 HISTORY — DX: Acute kidney failure, unspecified: N18.9

## 2016-08-23 HISTORY — DX: Carrier or suspected carrier of methicillin resistant Staphylococcus aureus: Z22.322

## 2016-08-23 HISTORY — PX: IR INFUSION THROMBOL ARTERIAL INITIAL (MS): IMG5376

## 2016-08-23 HISTORY — PX: IR ANGIOGRAM SELECTIVE EACH ADDITIONAL VESSEL: IMG667

## 2016-08-23 HISTORY — DX: Personal history of peptic ulcer disease: Z87.11

## 2016-08-23 HISTORY — DX: Personal history of other diseases of the respiratory system: Z87.09

## 2016-08-23 HISTORY — DX: Personal history of other mental and behavioral disorders: Z86.59

## 2016-08-23 HISTORY — DX: Other long term (current) drug therapy: Z79.899

## 2016-08-23 HISTORY — PX: IR ANGIOGRAM PULMONARY BILATERAL SELECTIVE: IMG664

## 2016-08-23 HISTORY — DX: Personal history of other specified conditions: Z87.898

## 2016-08-23 HISTORY — DX: Acidosis, unspecified: E87.20

## 2016-08-23 HISTORY — DX: Systemic inflammatory response syndrome (sirs) of non-infectious origin without acute organ dysfunction: R65.10

## 2016-08-23 HISTORY — DX: Obstructive sleep apnea (adult) (pediatric): G47.33

## 2016-08-23 HISTORY — PX: IR US GUIDE VASC ACCESS RIGHT: IMG2390

## 2016-08-23 HISTORY — DX: Other disorders of electrolyte and fluid balance, not elsewhere classified: E87.8

## 2016-08-23 HISTORY — DX: Acidosis: E87.2

## 2016-08-23 HISTORY — DX: Chronic kidney disease, unspecified: N17.9

## 2016-08-23 HISTORY — DX: Acute and chronic respiratory failure, unspecified whether with hypoxia or hypercapnia: J96.20

## 2016-08-23 LAB — COMPREHENSIVE METABOLIC PANEL
ALT: 24 U/L (ref 17–63)
ANION GAP: 15 (ref 5–15)
AST: 49 U/L — ABNORMAL HIGH (ref 15–41)
Albumin: 3.3 g/dL — ABNORMAL LOW (ref 3.5–5.0)
Alkaline Phosphatase: 101 U/L (ref 38–126)
BUN: 50 mg/dL — ABNORMAL HIGH (ref 6–20)
CHLORIDE: 95 mmol/L — AB (ref 101–111)
CO2: 25 mmol/L (ref 22–32)
Calcium: 8.8 mg/dL — ABNORMAL LOW (ref 8.9–10.3)
Creatinine, Ser: 1.64 mg/dL — ABNORMAL HIGH (ref 0.61–1.24)
GFR calc non Af Amer: 40 mL/min — ABNORMAL LOW (ref >60)
GFR, EST AFRICAN AMERICAN: 47 mL/min — AB (ref >60)
Glucose, Bld: 194 mg/dL — ABNORMAL HIGH (ref 65–99)
POTASSIUM: 2.9 mmol/L — AB (ref 3.5–5.1)
Sodium: 135 mmol/L (ref 135–145)
Total Bilirubin: 0.8 mg/dL (ref 0.3–1.2)
Total Protein: 7.1 g/dL (ref 6.5–8.1)

## 2016-08-23 LAB — BASIC METABOLIC PANEL
ANION GAP: 13 (ref 5–15)
BUN: 66 mg/dL — AB (ref 6–20)
CO2: 28 mmol/L (ref 22–32)
Calcium: 9.3 mg/dL (ref 8.9–10.3)
Chloride: 93 mmol/L — ABNORMAL LOW (ref 101–111)
Creatinine, Ser: 2.07 mg/dL — ABNORMAL HIGH (ref 0.61–1.24)
GFR, EST AFRICAN AMERICAN: 35 mL/min — AB (ref >60)
GFR, EST NON AFRICAN AMERICAN: 31 mL/min — AB (ref >60)
Glucose, Bld: 330 mg/dL — ABNORMAL HIGH (ref 65–99)
POTASSIUM: 2.8 mmol/L — AB (ref 3.5–5.1)
SODIUM: 134 mmol/L — AB (ref 135–145)

## 2016-08-23 LAB — TROPONIN I
TROPONIN I: 0.08 ng/mL — AB (ref <0.03)
Troponin I: 0.08 ng/mL (ref <0.03)
Troponin I: 0.13 ng/mL (ref <0.03)
Troponin I: 0.1 ng/mL (ref <0.03)

## 2016-08-23 LAB — CBC
HEMATOCRIT: 34.5 % — AB (ref 39.0–52.0)
HEMATOCRIT: 34.4 % — AB (ref 39.0–52.0)
HEMATOCRIT: 33.8 % — AB (ref 39.0–52.0)
HEMOGLOBIN: 10 g/dL — AB (ref 13.0–17.0)
Hemoglobin: 10.4 g/dL — ABNORMAL LOW (ref 13.0–17.0)
Hemoglobin: 10.3 g/dL — ABNORMAL LOW (ref 13.0–17.0)
MCH: 24.8 pg — ABNORMAL LOW (ref 26.0–34.0)
MCH: 24.6 pg — ABNORMAL LOW (ref 26.0–34.0)
MCH: 24.6 pg — ABNORMAL LOW (ref 26.0–34.0)
MCHC: 30.1 g/dL (ref 30.0–36.0)
MCHC: 29.9 g/dL — ABNORMAL LOW (ref 30.0–36.0)
MCHC: 29.6 g/dL — AB (ref 30.0–36.0)
MCV: 82.1 fL (ref 78.0–100.0)
MCV: 82.3 fL (ref 78.0–100.0)
MCV: 83.3 fL (ref 78.0–100.0)
PLATELETS: 271 10*3/uL (ref 150–400)
Platelets: 283 10*3/uL (ref 150–400)
Platelets: 246 10*3/uL (ref 150–400)
RBC: 4.2 MIL/uL — ABNORMAL LOW (ref 4.22–5.81)
RBC: 4.18 MIL/uL — AB (ref 4.22–5.81)
RBC: 4.06 MIL/uL — AB (ref 4.22–5.81)
RDW: 16.7 % — AB (ref 11.5–15.5)
RDW: 16.8 % — ABNORMAL HIGH (ref 11.5–15.5)
RDW: 16.9 % — ABNORMAL HIGH (ref 11.5–15.5)
WBC: 13.2 10*3/uL — AB (ref 4.0–10.5)
WBC: 11 10*3/uL — AB (ref 4.0–10.5)
WBC: 10 10*3/uL (ref 4.0–10.5)

## 2016-08-23 LAB — ECHOCARDIOGRAM COMPLETE
E/e' ratio: 8.85
FS: 28 % (ref 28–44)
HEIGHTINCHES: 67 in
IVS/LV PW RATIO, ED: 0.86
LA ID, A-P, ES: 38 mm
LA diam end sys: 38 mm
LA diam index: 1.48 cm/m2
LV E/e' medial: 8.85
LV TDI E'MEDIAL: 7.51
LVEEAVG: 8.85
LVOT area: 3.14 cm2
LVOT diameter: 20 mm
MV pk A vel: 81 m/s
MVPKEVEL: 66.5 m/s
PW: 14 mm — AB (ref 0.6–1.1)
WEIGHTICAEL: 5619.08 [oz_av]

## 2016-08-23 LAB — GLUCOSE, CAPILLARY: Glucose-Capillary: 218 mg/dL — ABNORMAL HIGH (ref 65–99)

## 2016-08-23 LAB — URINALYSIS, COMPLETE (UACMP) WITH MICROSCOPIC
BILIRUBIN URINE: NEGATIVE
Bacteria, UA: NONE SEEN
Glucose, UA: 150 mg/dL — AB
Ketones, ur: NEGATIVE mg/dL
Leukocytes, UA: NEGATIVE
Nitrite: NEGATIVE
Protein, ur: NEGATIVE mg/dL
RBC / HPF: NONE SEEN RBC/hpf (ref 0–5)
SPECIFIC GRAVITY, URINE: 1.016 (ref 1.005–1.030)
pH: 5 (ref 5.0–8.0)

## 2016-08-23 LAB — CBC WITH DIFFERENTIAL/PLATELET
BASOS ABS: 0.1 10*3/uL (ref 0–0.1)
BASOS PCT: 0 %
EOS ABS: 0 10*3/uL (ref 0–0.7)
EOS PCT: 0 %
HCT: 38.4 % — ABNORMAL LOW (ref 40.0–52.0)
Hemoglobin: 12.1 g/dL — ABNORMAL LOW (ref 13.0–18.0)
Lymphocytes Relative: 4 %
Lymphs Abs: 0.6 10*3/uL — ABNORMAL LOW (ref 1.0–3.6)
MCH: 25.4 pg — ABNORMAL LOW (ref 26.0–34.0)
MCHC: 31.4 g/dL — ABNORMAL LOW (ref 32.0–36.0)
MCV: 80.7 fL (ref 80.0–100.0)
Monocytes Absolute: 1.2 10*3/uL — ABNORMAL HIGH (ref 0.2–1.0)
Monocytes Relative: 8 %
Neutro Abs: 13.4 10*3/uL — ABNORMAL HIGH (ref 1.4–6.5)
Neutrophils Relative %: 88 %
PLATELETS: 335 10*3/uL (ref 150–440)
RBC: 4.77 MIL/uL (ref 4.40–5.90)
RDW: 18.1 % — ABNORMAL HIGH (ref 11.5–14.5)
WBC: 15.2 10*3/uL — AB (ref 3.8–10.6)

## 2016-08-23 LAB — PROCALCITONIN: Procalcitonin: 0.1 ng/mL

## 2016-08-23 LAB — LACTIC ACID, PLASMA
LACTIC ACID, VENOUS: 1.6 mmol/L (ref 0.5–1.9)
Lactic Acid, Venous: 3.2 mmol/L (ref 0.5–1.9)
Lactic Acid, Venous: 2.4 mmol/L (ref 0.5–1.9)
Lactic Acid, Venous: 2.8 mmol/L (ref 0.5–1.9)

## 2016-08-23 LAB — BRAIN NATRIURETIC PEPTIDE
B NATRIURETIC PEPTIDE 5: 124 pg/mL — AB (ref 0.0–100.0)
B Natriuretic Peptide: 136.5 pg/mL — ABNORMAL HIGH (ref 0.0–100.0)

## 2016-08-23 LAB — HEPARIN LEVEL (UNFRACTIONATED)
HEPARIN UNFRACTIONATED: 0.72 [IU]/mL — AB (ref 0.30–0.70)
HEPARIN UNFRACTIONATED: 0.74 [IU]/mL — AB (ref 0.30–0.70)
Heparin Unfractionated: <0.1 IU/mL — ABNORMAL LOW (ref 0.30–0.70)

## 2016-08-23 LAB — APTT: aPTT: 26 seconds (ref 24–36)

## 2016-08-23 LAB — PROTIME-INR
INR: 1.12
INR: 1.22
Prothrombin Time: 14.5 seconds (ref 11.4–15.2)
Prothrombin Time: 15.4 seconds — ABNORMAL HIGH (ref 11.4–15.2)

## 2016-08-23 LAB — AMYLASE: AMYLASE: 63 U/L (ref 28–100)

## 2016-08-23 LAB — FIBRINOGEN
Fibrinogen: 380 mg/dL (ref 210–475)
Fibrinogen: 387 mg/dL (ref 210–475)

## 2016-08-23 LAB — D-DIMER, QUANTITATIVE (NOT AT ARMC): D DIMER QUANT: 7.61 ug{FEU}/mL — AB (ref 0.00–0.50)

## 2016-08-23 LAB — MRSA PCR SCREENING: MRSA BY PCR: POSITIVE — AB

## 2016-08-23 LAB — PHOSPHORUS: PHOSPHORUS: 3.3 mg/dL (ref 2.5–4.6)

## 2016-08-23 LAB — LIPASE, BLOOD: LIPASE: 21 U/L (ref 11–51)

## 2016-08-23 LAB — MAGNESIUM: MAGNESIUM: 2.8 mg/dL — AB (ref 1.7–2.4)

## 2016-08-23 MED ORDER — HEPARIN (PORCINE) IN NACL 100-0.45 UNIT/ML-% IJ SOLN
1900.0000 [IU]/h | INTRAMUSCULAR | Status: DC
  Administered 2016-08-23: 1900 [IU]/h via INTRAVENOUS
  Filled 2016-08-23 (×2): qty 250

## 2016-08-23 MED ORDER — IPRATROPIUM-ALBUTEROL 0.5-2.5 (3) MG/3ML IN SOLN
3.0000 mL | Freq: Four times a day (QID) | RESPIRATORY_TRACT | Status: DC
  Administered 2016-08-24 – 2016-08-26 (×9): 3 mL via RESPIRATORY_TRACT
  Filled 2016-08-23 (×9): qty 3

## 2016-08-23 MED ORDER — VANCOMYCIN HCL IN DEXTROSE 1-5 GM/200ML-% IV SOLN
1000.0000 mg | Freq: Once | INTRAVENOUS | Status: AC
  Administered 2016-08-23: 1000 mg via INTRAVENOUS
  Filled 2016-08-23: qty 200

## 2016-08-23 MED ORDER — BUDESONIDE 0.25 MG/2ML IN SUSP
0.2500 mg | Freq: Two times a day (BID) | RESPIRATORY_TRACT | Status: DC
  Administered 2016-08-23 – 2016-08-26 (×7): 0.25 mg via RESPIRATORY_TRACT
  Filled 2016-08-23 (×7): qty 2

## 2016-08-23 MED ORDER — SODIUM CHLORIDE 0.9 % IV SOLN: 250.0000 mL | INTRAVENOUS | Status: DC | PRN

## 2016-08-23 MED ORDER — POTASSIUM CHLORIDE CRYS ER 20 MEQ PO TBCR
40.0000 meq | EXTENDED_RELEASE_TABLET | Freq: Once | ORAL | Status: AC
  Administered 2016-08-23: 40 meq via ORAL
  Filled 2016-08-23: qty 2

## 2016-08-23 MED ORDER — ALBUTEROL SULFATE (2.5 MG/3ML) 0.083% IN NEBU
2.5000 mg | INHALATION_SOLUTION | RESPIRATORY_TRACT | Status: DC
  Administered 2016-08-23: 2.5 mg via RESPIRATORY_TRACT
  Filled 2016-08-23: qty 3

## 2016-08-23 MED ORDER — SODIUM CHLORIDE 0.9 % IV BOLUS (SEPSIS)
1000.0000 mL | Freq: Once | INTRAVENOUS | Status: AC
  Administered 2016-08-23: 1000 mL via INTRAVENOUS

## 2016-08-23 MED ORDER — LIDOCAINE HCL (PF) 1 % IJ SOLN
INTRAMUSCULAR | Status: AC
  Filled 2016-08-23: qty 30

## 2016-08-23 MED ORDER — FENTANYL CITRATE (PF) 100 MCG/2ML IJ SOLN
INTRAMUSCULAR | Status: AC | PRN
  Administered 2016-08-23 (×2): 25 ug via INTRAVENOUS

## 2016-08-23 MED ORDER — MIDAZOLAM HCL 2 MG/2ML IJ SOLN
INTRAMUSCULAR | Status: AC | PRN
  Administered 2016-08-23 (×2): 0.5 mg via INTRAVENOUS

## 2016-08-23 MED ORDER — PANTOPRAZOLE SODIUM 40 MG PO TBEC
40.0000 mg | DELAYED_RELEASE_TABLET | Freq: Every day | ORAL | Status: DC
  Administered 2016-08-23 – 2016-08-27 (×5): 40 mg via ORAL
  Filled 2016-08-23 (×5): qty 1

## 2016-08-23 MED ORDER — SODIUM CHLORIDE 0.9 % IV SOLN
INTRAVENOUS | Status: DC
  Administered 2016-08-24: 04:00:00 via INTRAVENOUS

## 2016-08-23 MED ORDER — DEXTROSE 5 % IV SOLN
2.0000 g | Freq: Three times a day (TID) | INTRAVENOUS | Status: DC
  Filled 2016-08-23 (×3): qty 2

## 2016-08-23 MED ORDER — LIDOCAINE HCL 1 % IJ SOLN
INTRAMUSCULAR | Status: AC | PRN
  Administered 2016-08-23: 10 mL

## 2016-08-23 MED ORDER — SODIUM CHLORIDE 0.9 % IV BOLUS (SEPSIS)
500.0000 mL | Freq: Once | INTRAVENOUS | Status: AC
  Administered 2016-08-23: 500 mL via INTRAVENOUS

## 2016-08-23 MED ORDER — SODIUM CHLORIDE 0.9 % IV SOLN
INTRAVENOUS | Status: DC
  Administered 2016-08-23: 1000 mL via INTRAVENOUS
  Administered 2016-08-23: 500 mL via INTRAVENOUS
  Administered 2016-08-24: 08:00:00 via INTRAVENOUS

## 2016-08-23 MED ORDER — IPRATROPIUM BROMIDE 0.02 % IN SOLN
0.5000 mg | RESPIRATORY_TRACT | Status: DC
  Administered 2016-08-23: 0.5 mg via RESPIRATORY_TRACT
  Filled 2016-08-23: qty 2.5

## 2016-08-23 MED ORDER — IOPAMIDOL (ISOVUE-370) INJECTION 76%
75.0000 mL | Freq: Once | INTRAVENOUS | Status: AC | PRN
  Administered 2016-08-23: 75 mL via INTRAVENOUS

## 2016-08-23 MED ORDER — HEPARIN BOLUS VIA INFUSION
4000.0000 [IU] | Freq: Once | INTRAVENOUS | Status: AC
  Administered 2016-08-23: 4000 [IU] via INTRAVENOUS
  Filled 2016-08-23: qty 4000

## 2016-08-23 MED ORDER — SODIUM CHLORIDE 0.9 % IV SOLN
INTRAVENOUS | Status: DC
  Administered 2016-08-23: 500 mL via INTRAVENOUS

## 2016-08-23 MED ORDER — IPRATROPIUM-ALBUTEROL 0.5-2.5 (3) MG/3ML IN SOLN
3.0000 mL | RESPIRATORY_TRACT | Status: DC
  Administered 2016-08-23 (×2): 3 mL via RESPIRATORY_TRACT
  Filled 2016-08-23 (×2): qty 3

## 2016-08-23 MED ORDER — OXYCODONE HCL 5 MG PO TABS
5.0000 mg | ORAL_TABLET | Freq: Four times a day (QID) | ORAL | Status: DC | PRN
  Administered 2016-08-23 – 2016-08-24 (×4): 5 mg via ORAL
  Filled 2016-08-23 (×4): qty 1

## 2016-08-23 MED ORDER — MIDAZOLAM HCL 2 MG/2ML IJ SOLN
INTRAMUSCULAR | Status: AC
  Filled 2016-08-23: qty 2

## 2016-08-23 MED ORDER — MONTELUKAST SODIUM 10 MG PO TABS
10.0000 mg | ORAL_TABLET | Freq: Every day | ORAL | Status: DC
  Administered 2016-08-23 – 2016-08-26 (×4): 10 mg via ORAL
  Filled 2016-08-23 (×5): qty 1

## 2016-08-23 MED ORDER — FENTANYL CITRATE (PF) 100 MCG/2ML IJ SOLN
INTRAMUSCULAR | Status: AC
  Filled 2016-08-23: qty 2

## 2016-08-23 MED ORDER — SODIUM CHLORIDE 0.9% FLUSH
3.0000 mL | Freq: Two times a day (BID) | INTRAVENOUS | Status: DC
  Administered 2016-08-24: 3 mL via INTRAVENOUS

## 2016-08-23 MED ORDER — HEPARIN (PORCINE) IN NACL 100-0.45 UNIT/ML-% IJ SOLN
1500.0000 [IU]/h | INTRAMUSCULAR | Status: DC
  Administered 2016-08-23: 1700 [IU]/h via INTRAVENOUS
  Administered 2016-08-24 – 2016-08-25 (×3): 1500 [IU]/h via INTRAVENOUS
  Filled 2016-08-23 (×9): qty 250

## 2016-08-23 MED ORDER — SODIUM CHLORIDE 0.9% FLUSH: 3.0000 mL | INTRAVENOUS | Status: DC | PRN

## 2016-08-23 MED ORDER — SODIUM CHLORIDE 0.9 % IV SOLN
12.0000 mg | Freq: Once | INTRAVENOUS | Status: AC
  Administered 2016-08-23: 12 mg via INTRAVENOUS
  Filled 2016-08-23: qty 12

## 2016-08-23 MED ORDER — IPRATROPIUM-ALBUTEROL 0.5-2.5 (3) MG/3ML IN SOLN
3.0000 mL | Freq: Once | RESPIRATORY_TRACT | Status: AC
  Administered 2016-08-23: 3 mL via RESPIRATORY_TRACT
  Filled 2016-08-23: qty 3

## 2016-08-23 MED ORDER — IOPAMIDOL (ISOVUE-300) INJECTION 61%
INTRAVENOUS | Status: AC
  Administered 2016-08-23: 5 mL
  Filled 2016-08-23: qty 50

## 2016-08-23 MED ORDER — ZIPRASIDONE HCL 60 MG PO CAPS
60.0000 mg | ORAL_CAPSULE | Freq: Two times a day (BID) | ORAL | Status: DC
  Administered 2016-08-23 – 2016-08-25 (×3): 60 mg via ORAL
  Filled 2016-08-23 (×6): qty 1

## 2016-08-23 MED ORDER — BUPROPION HCL ER (SR) 100 MG PO TB12
100.0000 mg | ORAL_TABLET | Freq: Two times a day (BID) | ORAL | Status: DC
  Administered 2016-08-23 – 2016-08-26 (×6): 100 mg via ORAL
  Filled 2016-08-23 (×6): qty 1

## 2016-08-23 MED ORDER — PERFLUTREN LIPID MICROSPHERE
1.0000 mL | INTRAVENOUS | Status: AC | PRN
  Administered 2016-08-23: 4 mL via INTRAVENOUS
  Filled 2016-08-23: qty 10

## 2016-08-23 MED ORDER — LEVOFLOXACIN IN D5W 750 MG/150ML IV SOLN
750.0000 mg | Freq: Once | INTRAVENOUS | Status: AC
  Administered 2016-08-23: 750 mg via INTRAVENOUS
  Filled 2016-08-23: qty 150

## 2016-08-23 NOTE — H&P (Signed)
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 Compliance  Admitted 08/23/16 to [facility] with resp distress and hypoxemia, lacti acidosis, AKI and found to have Submassive PE. Started on bipap 40% fio2 but on [facility] feels he can come off. STarted on IV heparin gtt and sent to [facility] for consideration of EKOS. Started feeling better on arrival to [facility]  Noted in past hx he has hx of stomach ulcers but he denies bleedng episodes and such a diagnosis  PAST MEDICAL HISTORY :  He  has a past medical history of Arthritis; Asthma; CHF (congestive heart failure) ([place]); Diabetes ([place]); Heart trouble; History of stomach ulcers; Hypertension; S/P CABG x 2; Skin cancer; and Stroke ([place]).  PAST SURGICAL HISTORY: He  has a past surgical history that includes Kidney surgery; Hernia repair; Gallbladder surgery; Cardiac surgery; HEART BYPASS; and Cholecystectomy.  Allergies  Allergen Reactions  . Cephalosporins Other (See Comments)  . Dilaudid [Hydromorphone Hcl] Other (See Comments)    CAN NOT HEAR, CAN NOT THINK  . Hydrocodone-Acetaminophen Nausea And Vomiting  . Morphine And Related   . Tetracyclines & Related   . Penicillins Rash  . Sulfa Antibiotics Rash    No current facility-administered medications on file prior to encounter.    Current Outpatient Prescriptions on File Prior to Encounter  Medication Sig  . ADVAIR DISKUS 100-50 MCG/DOSE AEPB Inhale 1 puff into the lungs 2 (two) times daily.   [name] amLODipine (NORVASC) 10 MG tablet Take 10 mg by mouth daily.   . ARIPiprazole  (ABILIFY) 5 MG tablet Take 5 mg by mouth daily.   [name] aspirin EC 81 MG tablet Take 81 mg by mouth daily.  [name] atorvastatin (LIPITOR) 80 MG tablet Take 80 mg by mouth every evening.  . BELSOMRA 10 MG TABS Take 10 mg by mouth at bedtime.   [name] buPROPion (WELLBUTRIN SR) 100 MG 12 hr tablet Take 100 mg by mouth daily.   . celecoxib (CELEBREX) 200 MG capsule Take 1 capsule by mouth daily.   . cetirizine (ZYRTEC) 10 MG tablet Take 10 mg by mouth daily.  . Cholecalciferol (VITAMIN D3) 50000 units CAPS Take 50,000 Units by mouth every 7 (seven) days.  . clopidogrel (PLAVIX) 75 MG tablet Take 75 mg by mouth once.   . collagenase (SANTYL) ointment Apply 1 application topically daily.  [name] docusate sodium (COLACE) 100 MG capsule Take 100 mg by mouth 3 (three) times daily.  . furosemide (LASIX) 20 MG tablet Take 20 mg by mouth 2 (two) times daily.   [name] gabapentin (NEURONTIN) 400 MG capsule Take 400 mg by mouth 3 (three) times daily.   . hydrALAZINE (APRESOLINE) 25 MG tablet Take 25 mg by mouth 3 (three) times daily.   . hydrochlorothiazide (MICROZIDE) 12.5 MG capsule Take 12.5 mg by mouth daily.  [name] ibuprofen (ADVIL,MOTRIN) 600 MG tablet Take 1 tablet (600 mg total) by mouth every 8 (eight) hours as needed. (Patient not taking: Reported on 08/23/2016)  . ipratropium-albuterol (DUONEB) 0.5-2.5 (  3) MG/3ML SOLN Inhale 3 mLs into the lungs every 4 (four) hours as needed.  . isosorbide mononitrate (IMDUR) 60 MG 24 hr tablet Take 60 mg by mouth daily.   [name] levothyroxine (SYNTHROID, LEVOTHROID) 100 MCG tablet Take 100 mcg by mouth daily before breakfast.   . magnesium oxide (MAG-OX) 400 MG tablet Take 400 mg by mouth daily.  . meclizine (ANTIVERT) 25 MG tablet Take 25 mg by mouth 2 (two) times daily as needed.   . metolazone (ZAROXOLYN) 5 MG tablet Take 5 mg by mouth daily.  . metoprolol (LOPRESSOR) 50 MG tablet Take 50 mg by mouth 2 (two) times daily.   . montelukast (SINGULAIR) 10 MG tablet Take 1 tablet by mouth at  bedtime.   [name] nystatin (NYSTATIN) powder Apply 1 g topically 2 (two) times daily.  [name] oxyCODONE (ROXICODONE) 5 MG immediate release tablet Take 1 tablet (5 mg total) by mouth every 4 (four) hours as needed for severe pain.  . pantoprazole (PROTONIX) 40 MG tablet Take 1 tablet by mouth daily.  . Polysaccharide Iron Complex (FERREX 150 PO) Take 1 capsule by mouth daily.  [name] PROAIR HFA 108 (90 Base) MCG/ACT inhaler Inhale 2 puffs into the lungs every 4 (four) hours as needed.  . senna-docusate (SENOKOT-S) 8.6-50 MG tablet Take 2 tablets by mouth daily.  [name] torsemide (DEMADEX) 10 MG tablet Take 10 mg by mouth daily.  . traMADol (ULTRAM) 50 MG tablet Take 2 tablets (100 mg total) by mouth every 6 (six) hours as needed for severe pain. (Patient not taking: Reported on 08/23/2016)  . ziprasidone (GEODON) 60 MG capsule Take 60 mg by mouth 2 (two) times daily with a meal.     FAMILY HISTORY:  His indicated that the status of his mother is unknown. He indicated that the status of his father is unknown. He indicated that the status of his brother is unknown.    SOCIAL HISTORY: He  reports that he quit smoking about 53 years ago. He has never used smokeless tobacco. He reports that he does not drink alcohol or use drugs.   VITAL SIGNS: BP (!) 150/70 (BP Location: Left Arm)   Pulse 96   Temp 98.7 F (37.1 C) (Oral)   Resp (!) 22   Ht 5\' 7"  (1.702 m)   Wt (!) 159.3 kg (351 lb 3.1 oz)   SpO2 95%   BMI 55.00 kg/m   HEMODYNAMICS:    VENTILATOR SETTINGS: FiO2 (%):  [30 %] 30 %  INTAKE / OUTPUT: No intake/output data recorded.     EXAM  General Appearance:    OBESE - yes  Head:    Normocephalic, without obvious abnormality, atraumatic  Eyes:    PERRL - equal, conjunctiva/corneas - clea5      Ears:    Normal external ear canals, both ears  Nose:   NG tube - no but has BiPAP on  Throat:  ETT TUBE - no , OG tube - no  Neck:   Supple,  No enlargement/tenderness/nodules     Lungs:     Clear  to auscultation bilaterally, Mildly tachpenic  Chest wall:    No deformity  Heart:    S1 and S2 normal, no murmur, CVP - no.  Pressors - no  Abdomen:     Soft, no masses, no organomegaly  Genitalia:    Not done  Rectal:   not done  Extremities:   Extremities- Rt LE WARM and mildly tender  Skin:   Intact in exposed areas except in small bandaid in Rt knee airea     Neurologic:   Sedation - none -> RASS - +1 . Moves all 4s - yes. CAM-ICU - neg for delirium . Orientation - x 3 +       LABS  PULMONARY No results for input(s): PHART, PCO2ART, PO2ART, HCO3, TCO2, O2SAT in the last 168 hours.  Invalid input(s): PCO2, PO2  CBC  Recent Labs Lab 08/23/16 0303  HGB 12.1*  HCT 38.4*  WBC 15.2*  PLT 335    COAGULATION  Recent Labs Lab 08/23/16 0543  INR 1.12    CARDIAC   Recent Labs Lab 08/23/16 0303  TROPONINI 0.08*   No results for input(s): PROBNP in the last 168 hours.   CHEMISTRY  Recent Labs Lab 08/23/16 0303  NA 134*  K 2.8*  CL 93*  CO2 28  GLUCOSE 330*  BUN 66*  CREATININE 2.07*  CALCIUM 9.3   Estimated Creatinine Clearance: 47.9 mL/min (A) (by C-G formula based on SCr of 2.07 mg/dL (H)).   LIVER  Recent Labs Lab 08/23/16 0543  INR 1.12     INFECTIOUS  Recent Labs Lab 08/23/16 0403 08/23/16 0735  LATICACIDVEN 3.2* 2.4*     ENDOCRINE CBG (last 3)   Recent Labs  08/23/16 0931  GLUCAP 218*         IMAGING x48h  - image(s) personally visualized  -   highlighted in bold Ct Angio Chest Pe W And/or Wo Contrast  Result Date: 08/23/2016 CLINICAL DATA:  Shortness of breath. EXAM: CT ANGIOGRAPHY CHEST WITH CONTRAST TECHNIQUE: Multidetector CT imaging of the chest was performed using the standard protocol during bolus administration of intravenous contrast. Multiplanar CT image reconstructions and MIPs were obtained to evaluate the vascular anatomy. CONTRAST:  75 cc Isovue 370 IV COMPARISON:  Chest radiograph earlier this day.   Chest CT 03/02/2015 FINDINGS: Cardiovascular: Examination is positive for bilateral acute pulmonary emboli. Filling defects on the right involves the distal main, upper and lower lobar arteries extending into the subsegmental branches. On the left thrombus within the distal main, upper and lower lobar artery is. There is evidence of right heart strain RV to LV ratio 1.26. Thoracic aorta is normal in caliber, patient is post CABG. Common origin of the brachiocephalic and left common carotid artery Mediastinum/Nodes: Mediastinal lipomatosis. No adenopathy. Visualized thyroid gland is unremarkable. The esophagus is decompressed. Lungs/Pleura: Breathing motion artifact obscures evaluation particularly at the lung bases. Subsegmental linear atelectasis in the left lower lobe and lingula. 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 Subpleural fat on the left, no pleural effusion. No evidence pulmonary infarct. Upper Abdomen: No acute abnormality.  Right renal cyst. Musculoskeletal: There are no acute or suspicious osseous abnormalities. Degenerative change in the thoracic spine with bridging anterior osteophytes. Review of the MIP images confirms the above findings. IMPRESSION: Positive for bilateral acute PE with moderate thromboembolic burden. There is CT evidence of right heart strain (RV/LV Ratio = 1.26) consistent with at least submassive (intermediate risk) PE. The presence of right heart strain has been associated with an increased risk of morbidity and mortality. Please activate Code PE by paging [phone]. Critical Value/emergent results were called by telephone at the time of interpretation on 08/23/2016 at 5:09 am to Dr. Nance Pear , who verbally acknowledged these results. Electronically Signed   By: Jeb Levering M.D.   On: 08/23/2016 05:10  Dg Chest Portable 1 View  Result Date: 08/23/2016 CLINICAL DATA:  Acute onset of respiratory  distress. Decreased O2 saturation. Initial encounter. EXAM: PORTABLE CHEST 1 VIEW COMPARISON:  Chest radiograph performed 07/13/2016 FINDINGS: The lungs are well-aerated. There is elevation of the right hemidiaphragm. Mild bibasilar atelectasis or scarring is noted. There is no evidence of pleural effusion or pneumothorax. The cardiomediastinal silhouette is mildly enlarged. The patient is status post median sternotomy. No acute osseous abnormalities are seen. IMPRESSION: Elevation of the right hemidiaphragm. Mild bibasilar atelectasis or scarring. Mild cardiomegaly. Electronically Signed   By: Garald Balding M.D.   On: 08/23/2016 03:15       ASSESSMENT and PLAN  Acute on chronic respiratory failure (HCC) Due to baseline OSA, ? Copd/asthma chronic diast chf and now acute submassive PE Looks like he can come off day time bipap  Plan Change bipap to [place] schedule  Acute on chronic renal failure (HCC) Creat 1.35 in feb 2018 Creat 2mg m% this admit 08/23/2016; associate with lactic acidosis  Plan Hydrate and reasess  Pulmonary embolism ([place]) Primary problems. Submassive. At [facility] to [facility] likely responding to IV heparin gttt. Prior stroke , plavix, recent RLE fracture and AKI all can pose risk for bleeding  PLAN Continue IV heparin gtt Consult IR for EKOS but need to have a high threshold to deploy Hold plavix for now  Diabetes ([place]) icu hyerglycemia protocol  Lactic acidosis ? Cause  Plan Fluid challenge and reassess  Electrolyte imbalance Severe low K at [facility] admit. Not repleted. Making urine per RN   Plan Po kcl  Recheck bmet  Current use of proton pump inhibitor ppi for sup  OSA (obstructive sleep apnea) Baseline cpppa qhs use  Plan cpap qhs   History of bipolar disorder On geodon and wellbutrin per chart  Plan Restart but renal correct  Chronic diastolic CHF (congestive heart failure) ([place]) Looks dry  Plan Fluid Hold lasix  History of  gastric ulcer He denies this but is in chart  Plan ppi To keep this mind while considering ekos  History of asthma No wheeze. Astma mentioned in chart  Plan duoneb and pulmicort neb Restart singulair  History of chronic pain On gabapentin. Currnently not in pain  Plan Hold off gabapentin      FAMILY  - Updates: 08/23/2016 --> patient updated. No family at bedside  - Inter-disciplinary family meet or Palliative Care meeting due by:  DAy 7. Current LOS is LOS 0 days  CODE STATUS Code Status History    Date Active Date Inactive Code Status Order ID Comments User Context   07/14/2016  8:10 AM 07/14/2016  7:07 PM Full Code [id number]  Saundra Shelling, MD ED   04/27/2016  2:28 AM 04/28/2016  5:18 PM Full Code [id number]  Lance Coon, MD Inpatient   02/16/2016  8:34 PM 02/17/2016  3:40 PM Full Code [id number]  Harvie Bridge, DO ED        DISPO Keep in ICU       The patient is critically ill with multiple organ systems failure and requires high complexity decision making for assessment and support, frequent evaluation and titration of therapies, application of advanced monitoring technologies and extensive interpretation of multiple databases.   Critical Care Time devoted to patient care services described in this note is  30  Minutes. This time reflects time of care of this signee Dr Brand Males. This critical care time does not reflect procedure time, or teaching time or supervisory  time of PA/NP/Med student/Med Resident etc but could involve care discussion time    Dr. Brand Males, M.D., [facility].C.P Pulmonary and Critical Care Medicine Staff Physician [facility] Pager: [phone], If no answer or between  15:00h - 7:00h: call [phone]  08/23/2016 10:04 AM

## 2016-08-23 NOTE — Progress Notes (Signed)
[facility] for heparin dosing Indication: pulmonary embolus  Allergies  Allergen Reactions  . Cephalosporins Other (See Comments)  . Dilaudid [Hydromorphone Hcl] Other (See Comments)    CAN NOT HEAR, CAN NOT THINK  . Hydrocodone-Acetaminophen Nausea And Vomiting  . Morphine And Related   . Tetracyclines & Related   . Penicillins Rash  . Sulfa Antibiotics Rash    Patient Measurements: Height: 5\' 7"  (170.2 cm) Weight: (!) 351 lb 3.1 oz (159.3 kg) IBW/kg (Calculated) : 66.1 Heparin Dosing Weight: 107.8 kg  Vital Signs: Temp: 99.2 F (37.3 C) (04/01 1153) Temp Source: Oral (04/01 1153) BP: 107/92 (04/01 1100) Pulse Rate: 90 (04/01 1100)  Labs:  Recent Labs  08/23/16 0303 08/23/16 0543 08/23/16 1124  HGB 12.1*  --  10.4*  HCT 38.4*  --  34.5*  PLT 335  --  283  APTT  --  26  --   LABPROT  --  14.5  --   INR  --  1.12  --   HEPARINUNFRC  --  <0.10* 0.72*  CREATININE 2.07*  --  1.64*  TROPONINI 0.08*  --  0.08*    Estimated Creatinine Clearance: 60.4 mL/min (A) (by C-G formula based on SCr of 1.64 mg/dL (H)).   Medical History: Past Medical History:  Diagnosis Date  . Arthritis   . Asthma   . CHF (congestive heart failure) ([place])   . Diabetes ([place])   . Heart trouble   . History of stomach ulcers   . Hypertension   . S/P CABG x 2   . Skin cancer   . Stroke [facility])     Medications:  Scheduled:  . budesonide (PULMICORT) nebulizer solution  0.25 mg Nebulization BID  . buPROPion  100 mg Oral BID  . ipratropium-albuterol  3 mL Nebulization Q4H  . montelukast  10 mg Oral QHS  . pantoprazole  40 mg Oral Daily  . ziprasidone  60 mg Oral BID WC    Assessment: Patient admitted for SOB and found to have bilateral PE on CT with R heart strain Recent immobility due to leg fracture Started on high heparin rate Level was slightly supratherapeutic this morning hgb 10.4, plts ok   Goal of Therapy:  Heparin level 0.3-0.7  units/ml Monitor platelets by anticoagulation protocol: Yes    Plan:  -Reduce heparin to 1700 units/hr -Daily HL, CBC -Check level tonight -F/u IR decision re: EKOS    Hughes Better, PharmD, BCPS Clinical Pharmacist 08/23/2016 12:43 PM

## 2016-08-23 NOTE — Progress Notes (Signed)
ANTICOAGULATION CONSULT NOTE - Initial Consult  Pharmacy Consult for heparin dosing Indication: pulmonary embolus  Allergies  Allergen Reactions  . Cephalosporins Other (See Comments)  . Dilaudid [Hydromorphone Hcl] Other (See Comments)    CAN NOT HEAR, CAN NOT THINK  . Hydrocodone-Acetaminophen Nausea And Vomiting  . Morphine And Related   . Tetracyclines & Related   . Penicillins Rash  . Sulfa Antibiotics Rash    Patient Measurements: Height: 5\' 7"  (170.2 cm) Weight: (!) 367 lb (166.5 kg) IBW/kg (Calculated) : 66.1 Heparin Dosing Weight: 107.8 kg  Vital Signs: Temp: 98.5 F (36.9 C) (04/01 0257) Temp Source: Axillary (04/01 0257) BP: 166/65 (04/01 0257) Pulse Rate: 101 (04/01 0257)  Labs:  Recent Labs  08/23/16 0303  HGB 12.1*  HCT 38.4*  PLT 335  CREATININE 2.07*  TROPONINI 0.08*    Estimated Creatinine Clearance: 49.2 mL/min (A) (by C-G formula based on SCr of 2.07 mg/dL (H)).   Medical History: Past Medical History:  Diagnosis Date  . Arthritis   . Asthma   . CHF (congestive heart failure) ([place])   . Diabetes ([place])   . Heart trouble   . History of stomach ulcers   . Hypertension   . S/P CABG x 2   . Skin cancer   . Stroke [facility])     Medications:  Scheduled:  . heparin  4,000 Units Intravenous Once    Assessment: Patient admitted for SOB and found to have bilateral PE on CT Pharmacy consulted for heparin  Goal of Therapy:  Heparin level 0.3-0.7 units/ml Monitor platelets by anticoagulation protocol: Yes   Plan:  Give 4000 units bolus x 1  Will initiate heparin drip @ 1900 units/hour and will check HL 4/1 @ 1300. Baseline aPTT/PT/INR/HL ordered.  Thank you for this consult.  Tobie Lords, PharmD, BCPS Clinical Pharmacist 08/23/2016

## 2016-08-23 NOTE — Assessment & Plan Note (Signed)
Baseline cpppa qhs use  Plan cpap qhs

## 2016-08-23 NOTE — Progress Notes (Signed)
  Echocardiogram 2D Echocardiogram has been performed.  [name] 08/23/2016, 12:47 PM

## 2016-08-23 NOTE — Sedation Documentation (Signed)
Patient denies pain and is resting comfortably.  

## 2016-08-23 NOTE — Consult Note (Signed)
Chief Complaint: Patient was seen in consultation today for No chief complaint on file.  at the request of Brand Males, MD    Referring Physician(s): Brand Males, MD    Supervising Physician: Arne Cleveland  Patient Status: [facility] - In-pt  History of Present Illness: [name] is a 71 y.o. 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 Compliance  Admitted 08/23/16 to [facility] ER with resp distress and hypoxemia, lacti acidosis, AKI and found to have Submassive bilateral  PE. Started on bipap 40% fio2 but on [facility] feels [facility] can come off. STarted on IV heparin gtt and sent to [facility] for consideration of EKOS. Started feeling better on arrival to [facility] Does have hx of stomach ulcers but he denies bleedng episodes and such a diagnosis. No cancer or mets. No ICH. No recent surgery. No active bleed.  Past Medical History:  Diagnosis Date  . Arthritis   . Asthma   . CHF (congestive heart failure) ([place])   . Diabetes ([place])   . Heart trouble   . History of stomach ulcers   . Hypertension   . S/P CABG x 2   . Skin cancer   . Stroke [facility])     Past Surgical History:  Procedure Laterality Date  . CARDIAC SURGERY    . CHOLECYSTECTOMY    . GALLBLADDER SURGERY    . HEART BYPASS    . HERNIA REPAIR    . KIDNEY SURGERY      Allergies: Cephalosporins; Dilaudid [hydromorphone hcl]; Hydrocodone-acetaminophen; Morphine and related; Tetracyclines & related; Penicillins; and Sulfa antibiotics  Medications: Prior to Admission medications   Medication Sig Start Date End Date Taking? Authorizing Provider  ADVAIR DISKUS 100-50 MCG/DOSE AEPB Inhale 1 puff into the lungs 2 (two) times daily.  05/31/13   Historical Provider, MD  amLODipine (NORVASC) 10 MG tablet Take 10 mg by mouth daily.  05/31/13   Historical Provider, MD  ARIPiprazole (ABILIFY) 5 MG tablet Take 5 mg by mouth daily.  05/31/13    Historical Provider, MD  aspirin EC 81 MG tablet Take 81 mg by mouth daily.    Historical Provider, MD  atorvastatin (LIPITOR) 80 MG tablet Take 80 mg by mouth every evening.    Historical Provider, MD  BELSOMRA 10 MG TABS Take 10 mg by mouth at bedtime.  04/06/16   Historical Provider, MD  buPROPion (WELLBUTRIN SR) 100 MG 12 hr tablet Take 100 mg by mouth daily.  05/31/13   Historical Provider, MD  celecoxib (CELEBREX) 200 MG capsule Take 1 capsule by mouth daily.  05/21/16   Historical Provider, MD  cetirizine (ZYRTEC) 10 MG tablet Take 10 mg by mouth daily.    Historical Provider, MD  Cholecalciferol (VITAMIN D3) 50000 units CAPS Take 50,000 Units by mouth every 7 (seven) days.    Historical Provider, MD  clopidogrel (PLAVIX) 75 MG tablet Take 75 mg by mouth once.  05/31/13   Historical Provider, MD  collagenase (SANTYL) ointment Apply 1 application topically daily.    Historical Provider, MD  docusate sodium (COLACE) 100 MG capsule Take 100 mg by mouth 3 (three) times daily.    Historical Provider, MD  furosemide (LASIX) 20 MG tablet Take 20 mg by mouth 2 (two) times daily.  05/31/13   Historical Provider, MD  gabapentin (NEURONTIN) 400 MG capsule  Take 400 mg by mouth 3 (three) times daily.  05/31/13   Historical Provider, MD  hydrALAZINE (APRESOLINE) 25 MG tablet Take 25 mg by mouth 3 (three) times daily.     Historical Provider, MD  hydrochlorothiazide (MICROZIDE) 12.5 MG capsule Take 12.5 mg by mouth daily.    Historical Provider, MD  ibuprofen (ADVIL,MOTRIN) 600 MG tablet Take 1 tablet (600 mg total) by mouth every 8 (eight) hours as needed. Patient not taking: Reported on 08/23/2016 07/20/16   Darel Hong, MD  ipratropium-albuterol (DUONEB) 0.5-2.5 (3) MG/3ML SOLN Inhale 3 mLs into the lungs every 4 (four) hours as needed. 05/01/16   Historical Provider, MD  isosorbide mononitrate (IMDUR) 60 MG 24 hr tablet Take 60 mg by mouth daily.  05/31/13   Historical Provider, MD  levothyroxine (SYNTHROID,  LEVOTHROID) 100 MCG tablet Take 100 mcg by mouth daily before breakfast.  05/31/13   Historical Provider, MD  magnesium oxide (MAG-OX) 400 MG tablet Take 400 mg by mouth daily.    Historical Provider, MD  meclizine (ANTIVERT) 25 MG tablet Take 25 mg by mouth 2 (two) times daily as needed.  05/31/13   Historical Provider, MD  metolazone (ZAROXOLYN) 5 MG tablet Take 5 mg by mouth daily.    Historical Provider, MD  metoprolol (LOPRESSOR) 50 MG tablet Take 50 mg by mouth 2 (two) times daily.  05/31/13   Historical Provider, MD  montelukast (SINGULAIR) 10 MG tablet Take 1 tablet by mouth at bedtime.     Historical Provider, MD  nystatin (NYSTATIN) powder Apply 1 g topically 2 (two) times daily.    Historical Provider, MD  oxyCODONE (ROXICODONE) 5 MG immediate release tablet Take 1 tablet (5 mg total) by mouth every 4 (four) hours as needed for severe pain. 07/21/16   Merlyn Lot, MD  pantoprazole (PROTONIX) 40 MG tablet Take 1 tablet by mouth daily.    Historical Provider, MD  Polysaccharide Iron Complex (FERREX 150 PO) Take 1 capsule by mouth daily.    Historical Provider, MD  PROAIR HFA 108 [id number] Base) MCG/ACT inhaler Inhale 2 puffs into the lungs every 4 (four) hours as needed. 05/01/16   Historical Provider, MD  senna-docusate (SENOKOT-S) 8.6-50 MG tablet Take 2 tablets by mouth daily.    Historical Provider, MD  torsemide (DEMADEX) 10 MG tablet Take 10 mg by mouth daily.    Historical Provider, MD  traMADol (ULTRAM) 50 MG tablet Take 2 tablets (100 mg total) by mouth every 6 (six) hours as needed for severe pain. Patient not taking: Reported on 08/23/2016 07/21/16   Merlyn Lot, MD  ziprasidone (GEODON) 60 MG capsule Take 60 mg by mouth 2 (two) times daily with a meal.  05/31/13   Historical Provider, MD     Family History  Problem Relation Age of Onset  . Diabetes Mother   . Heart attack Father   . Diabetes Brother   . Heart attack Brother     Social History   Social History  . Marital  status: Divorced    Spouse name: N/A  . Number of children: N/A  . Years of education: N/A   Social History Main Topics  . Smoking status: Former Smoker    Quit date: 03/15/1963  . Smokeless tobacco: Never Used  . Alcohol use No  . Drug use: No  . Sexual activity: Not on file   Other Topics Concern  . Not on file   Social History Narrative  . No narrative on file  ECOG Status: 3 - Symptomatic, >50% confined to bed  Review of Systems: A 12 point ROS discussed and pertinent positives are indicated in the HPI above.  All other systems are negative.  Review of Systems  Vital Signs: BP (!) 107/92   Pulse 90   Temp 99.2 F (37.3 C) (Oral)   Resp 16   Ht 5\' 7"  (1.702 m)   Wt (!) 351 lb 3.1 oz (159.3 kg)   SpO2 95%   BMI 55.00 kg/m   Physical Exam  Mallampati Score:     Imaging: Ct Angio Chest Pe W And/or Wo Contrast  Result Date: 08/23/2016 CLINICAL DATA:  Shortness of breath. EXAM: CT ANGIOGRAPHY CHEST WITH CONTRAST TECHNIQUE: Multidetector CT imaging of the chest was performed using the standard protocol during bolus administration of intravenous contrast. Multiplanar CT image reconstructions and MIPs were obtained to evaluate the vascular anatomy. CONTRAST:  75 cc Isovue 370 IV COMPARISON:  Chest radiograph earlier this day.  Chest CT 03/02/2015 FINDINGS: Cardiovascular: Examination is positive for bilateral acute pulmonary emboli. Filling defects on the right involves the distal main, upper and lower lobar arteries extending into the subsegmental branches. On the left thrombus within the distal main, upper and lower lobar artery is. There is evidence of right heart strain RV to LV ratio 1.26. Thoracic aorta is normal in caliber, patient is post CABG. Common origin of the brachiocephalic and left common carotid artery Mediastinum/Nodes: Mediastinal lipomatosis. No adenopathy. Visualized thyroid gland is unremarkable. The esophagus is decompressed. Lungs/Pleura: Breathing  motion artifact obscures evaluation particularly at the lung bases. Subsegmental linear atelectasis in the left lower lobe and lingula. 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 Subpleural fat on the left, no pleural effusion. No evidence pulmonary infarct. Upper Abdomen: No acute abnormality.  Right renal cyst. Musculoskeletal: There are no acute or suspicious osseous abnormalities. Degenerative change in the thoracic spine with bridging anterior osteophytes. Review of the MIP images confirms the above findings. IMPRESSION: Positive for bilateral acute PE with moderate thromboembolic burden. There is CT evidence of right heart strain (RV/LV Ratio = 1.26) consistent with at least submassive (intermediate risk) PE. The presence of right heart strain has been associated with an increased risk of morbidity and mortality. Please activate Code PE by paging [phone]. Critical Value/emergent results were called by telephone at the time of interpretation on 08/23/2016 at 5:09 am to Dr. Nance Pear , who verbally acknowledged these results. Electronically Signed   By: Jeb Levering M.D.   On: 08/23/2016 05:10   Dg Chest Port 1 View  Result Date: 08/23/2016 CLINICAL DATA:  Bilateral pulmonary emboli. EXAM: PORTABLE CHEST 1 VIEW COMPARISON:  Chest radiographs and chest CTA obtained earlier today FINDINGS: Stable mildly enlarged cardiac silhouette and post CABG changes. Stable elevation of the right hemidiaphragm. Clear lungs. Thoracic spine degenerative changes. IMPRESSION: No acute abnormality.  Stable cardiomegaly Electronically Signed   By: Claudie Revering M.D.   On: 08/23/2016 10:56   Dg Chest Portable 1 View  Result Date: 08/23/2016 CLINICAL DATA:  Acute onset of respiratory distress. Decreased O2 saturation. Initial encounter. EXAM: PORTABLE CHEST 1 VIEW COMPARISON:  Chest radiograph performed 07/13/2016 FINDINGS: The lungs are well-aerated. There  is elevation of the right hemidiaphragm. Mild bibasilar atelectasis or scarring is noted. There is no evidence of pleural effusion or pneumothorax. The cardiomediastinal silhouette is mildly enlarged. The patient is status post median sternotomy. No acute osseous abnormalities are seen.  IMPRESSION: Elevation of the right hemidiaphragm. Mild bibasilar atelectasis or scarring. Mild cardiomegaly. Electronically Signed   By: Garald Balding M.D.   On: 08/23/2016 03:15   Transthoracic Echocardiography  Patient:    Calixto, Pavel MR #:       [id number] Study Date: 08/23/2016 Gender:     M Age:        79 Height:     170.2 cm Weight:     159.3 kg BSA:        2.84 m^2 Pt. Status: Room:       2M07C   [facility]y [facility] [id number]  [id number]    LEXNTZGYF, VCBSWH [address], [place], [place]  SONOGRAPHER  Johny Chess, RDCS, CCT  PERFORMING   [facility], Inpatient  cc:  ------------------------------------------------------------------- LV EF: 60% -   65%  ------------------------------------------------------------------- Indications:      Pulmonary embolus 415.19.  ------------------------------------------------------------------- History:   PMH:   Congestive heart failure.  Risk factors: Obstructive sleep apnea. Diabetes mellitus.  ------------------------------------------------------------------- Study Conclusions  - Procedure narrative: Transthoracic echocardiography. Image   quality was poor. The study was technically difficult, as a   result of poor sound wave transmission and body habitus.   Intravenous contrast (Definity) was administered. - Left ventricle: The cavity size was normal. Wall thickness was   increased in a pattern of moderate LVH. Systolic function was   normal. The estimated ejection fraction was in the range of 60%   to 65%. Wall motion was normal; there were no regional wall   motion  abnormalities. Doppler parameters are consistent with   abnormal left ventricular relaxation (grade 1 diastolic   dysfunction). The E/e&' ratio is between 8-15, suggesting   indeterminate LV filling pressure. - Aortic valve: Sclerosis without stenosis. There was no   regurgitation. - Right ventricle: Poorly visualized, however, in the short axis   images and definity images - there may be RV diltitation with   sparing of apical contraction. - Tricuspid valve: There was trivial regurgitation.  Urgent and Critical Findings:   A non-critical finding, cannot exclude right heart strain, was reported to Dr. Chase Caller , an attending physician responsible for the patient , by Dr. Debara Pickett , on 08/23/2016 , at 01:00 PM. Impressions:  - Technically difficult study given body habitus. Definity contrast   given. Not adequate to exclude hemodynamically significant PE.   LVEF 60-65%, normal wall motion, diastolic dysfunction,   indeterminate LV filling pressure.  ------------------------------------------------------------------- Study data:  Comparison was made to the study of 04/27/2016.  Study status:  Emergent.  Procedure:  Transthoracic echocardiography. Image quality was poor. The study was technically difficult, as a result of poor sound wave transmission and body habitus. Intravenous contrast (Definity) was administered.  Study completion:  There were no complications.          Transthoracic echocardiography.  M-mode, complete 2D, spectral Doppler, and color Doppler.  Birthdate:  Patient birthdate: September 05, 1945.  Age:  Patient is 71 yr old.  Sex:  Gender: male.    BMI: 55 kg/m^2.  Blood pressure:     128/51  Patient status:  Inpatient.  Study date: Study date: 08/23/2016. Study time: 11:35 AM.  Location:  ICU/CCU   -------------------------------------------------------------------  ------------------------------------------------------------------- Left ventricle:  The cavity size was  normal. Wall thickness was increased in a pattern of moderate LVH. Systolic function was normal. The estimated ejection fraction was in the range  of 60% to 65%. Wall motion was normal; there were no regional wall motion abnormalities. Doppler parameters are consistent with abnormal left ventricular relaxation (grade 1 diastolic dysfunction). The E/e&' ratio is between 8-15, suggesting indeterminate LV filling pressure.  ------------------------------------------------------------------- Aortic valve:  Sclerosis without stenosis.  Doppler:  There was no regurgitation.  ------------------------------------------------------------------- Aorta:  Aortic root: The aortic root was normal in size. Ascending aorta: The ascending aorta was normal in size.  ------------------------------------------------------------------- Right ventricle:  Poorly visualized, however, in the short axis images and definity images - there may be RV diltitation with sparing of apical contraction.  ------------------------------------------------------------------- Pulmonic valve:    The valve appears to be grossly normal. Doppler:  There was no significant regurgitation.  ------------------------------------------------------------------- Tricuspid valve:   Doppler:  There was trivial regurgitation.  ------------------------------------------------------------------- Pulmonary artery:   Poorly visualized.  ------------------------------------------------------------------- Right atrium:  Poorly visualized.  ------------------------------------------------------------------- Pericardium:  There was no pericardial effusion.  ------------------------------------------------------------------- Systemic veins:  The IVC is not visualized.  ------------------------------------------------------------------- Measurements   Left ventricle                           Value        Reference  LV ID, ED,  PLAX chordal                  47    mm     43 - 52  LV ID, ES, PLAX chordal                  34    mm     23 - 38  LV fx shortening, PLAX chordal (L)       28    %      >=29  LV PW thickness, ED                      14    mm     ---------  IVS/LV PW ratio, ED                      0.86         <=1.3  LV e&', medial                            7.51  cm/s   ---------  LV E/e&', medial                          8.85         ---------    Ventricular septum                       Value        Reference  IVS thickness, ED                        12    mm     ---------    LVOT                                     Value        Reference  LVOT ID, S  20    mm     ---------  LVOT area                                3.14  cm^2   ---------    Aorta                                    Value        Reference  Aortic root ID, ED                       35    mm     ---------    Left atrium                              Value        Reference  LA ID, A-P, ES                           38    mm     ---------  LA ID/bsa, A-P                           1.34  cm/m^2 <=2.2    Mitral valve                             Value        Reference  Mitral E-wave peak velocity              66.5  cm/s   ---------  Mitral A-wave peak velocity              81    cm/s   ---------  Mitral E/A ratio, peak                   0.8          ---------  Legend: (L)  and  (H)  mark values outside specified reference range.  ------------------------------------------------------------------- Prepared and Electronically Authenticated by  Lyman Bishop MD 2018-04-01T13:02:31  Labs:  CBC:  Recent Labs  07/14/16 0950 07/21/16 1607 08/23/16 0303 08/23/16 1124  WBC 7.2 10.2 15.2* 13.2*  HGB 12.5* 10.2* 12.1* 10.4*  HCT 38.3* 32.3* 38.4* 34.5*  PLT 268 248 335 283    COAGS:  Recent Labs  02/16/16 1551 08/23/16 0543 08/23/16 1124  INR 1.03 1.12 1.22  APTT  --  26  --     BMP:  Recent  Labs  07/14/16 0950 07/21/16 1607 08/23/16 0303 08/23/16 1124  NA 136 135 134* 135  K 4.1 4.0 2.8* 2.9*  CL 103 104 93* 95*  CO2 25 24 28 25   GLUCOSE 203* 212* 330* 194*  BUN 21* 20 66* 50*  CALCIUM 8.5* 8.2* 9.3 8.8*  CREATININE 1.28* 1.35* 2.07* 1.64*  GFRNONAA 55* 51* 31* 40*  GFRAA >60 59* 35* 47*    LIVER FUNCTION TESTS:  Recent Labs  04/26/16 2220 07/13/16 2316 07/21/16 1607 08/23/16 1124  BILITOT 0.5 0.4 0.4 0.8  AST 44* 28 27 49*  ALT 28 29 21 24   ALKPHOS 114 76 72 101  PROT 7.0 6.8 6.5 7.1  ALBUMIN  3.0* 3.2* 3.2* 3.3*   Troponin I <0.03 ng/mL 0.08    Comments: CRITICAL RESULT CALLED TO, READ BACK BY AND VERIFIED WITH:  RN Justice Rocher AT [id number] MARTINB      TUMOR MARKERS: No results for input(s): AFPTM, CEA, CA199, CHROMGRNA in the last 8760 hours.  Assessment and Plan:  Submassive bilateral pulmonary emboli with RIGHT heart strain on CT and ECHO, elev troponin, moderate bleeding risk profile, looks ill. He would be an appropriate candidate for [place] assisted catheter-directed PE thrombolysis. Discussed USAT benefits (feel better, decrease risk of chronic PA htn), risks (bleeding), time course to symptom resolution, technique via R IJ, alternatives. Pt seemed to understand and did ask clarifying questions which were answered. He is motivated to proceed. We will plan to initiate USAT this afternoon, f/u in AM with PA pressures. I don't think he needs an IVC filter.  Thank you for this interesting consult.  I greatly enjoyed meeting ZACHARI ALBERTA and look forward to participating in their care.  A copy of this report was sent to the requesting provider on this date.  Electronically Signed: Isidro Monks III, DAYNE Ashleynicole Mcclees 08/23/2016, 1:19 PM   I spent a total of 40 Minutes   in face to face in clinical consultation, greater than 50% of which was counseling/coordinating care for submassive bilateral pulmonary emboli.

## 2016-08-23 NOTE — Assessment & Plan Note (Signed)
On gabapentin. Currnently not in pain  Plan Hold off gabapentin

## 2016-08-23 NOTE — Assessment & Plan Note (Signed)
Primary problems. Submassive. At arrival to [facility] likely responding to IV heparin gttt. Prior stroke , plavix, recent RLE fracture and AKI all can pose risk for bleeding  PLAN Continue IV heparin gtt Consult IR for EKOS but need to have a high threshold to deploy Hold plavix for now

## 2016-08-23 NOTE — ED Provider Notes (Signed)
[facility] Emergency Department Provider Note   ____________________________________________   I have reviewed the triage vital signs and the nursing notes.   HISTORY  Chief Complaint Respiratory Distress (Pt. here via EMS from home.)   History limited by: Not Limited   HPI [name] is a 71 y.o. male who presents to the emergency department today from home because of concerns for fall and breathing difficulty. Patient states that he has been feeling more short of breath for the past few days. This has been accompanied by a cough productive of green and dark phlegm. He has not noticed any blood in his cough. He has had some chest discomfort with this. Has history of COPD and CHF. No fevers. Patient recently had a right fibular fracture and was just released from rehabilitation.   Past Medical History:  Diagnosis Date  . Arthritis   . CHF (congestive heart failure) ([place])   . Diabetes ([place])   . Heart trouble   . History of stomach ulcers   . Hypertension   . S/P CABG x 2   . Skin cancer   . Stroke [facility])     Patient Active Problem List   Diagnosis Date Noted  . Chest pain 07/14/2016  . UTI (urinary tract infection) 04/27/2016  . CAD (coronary artery disease) 04/27/2016  . Diabetes ([place]) 04/27/2016  . HTN (hypertension) 04/27/2016  . Chronic CHF (congestive heart failure) ([place]) 04/27/2016  . CKD (chronic kidney disease), stage III 04/27/2016  . Chest pain, rule out acute myocardial infarction 02/16/2016    Past Surgical History:  Procedure Laterality Date  . CARDIAC SURGERY    . CHOLECYSTECTOMY    . GALLBLADDER SURGERY    . HEART BYPASS    . HERNIA REPAIR    . KIDNEY SURGERY      Prior to Admission medications   Medication Sig Start Date End Date Taking? Authorizing Provider  ADVAIR DISKUS 100-50 MCG/DOSE AEPB Inhale 1 puff into the lungs 2 (two) times daily.  05/31/13   Historical Provider, MD  amLODipine (NORVASC) 10 MG tablet Take  10 mg by mouth daily.  05/31/13   Historical Provider, MD  ARIPiprazole (ABILIFY) 5 MG tablet Take 5 mg by mouth daily.  05/31/13   Historical Provider, MD  aspirin EC 81 MG tablet Take 81 mg by mouth daily.    Historical Provider, MD  atorvastatin (LIPITOR) 80 MG tablet Take 80 mg by mouth every evening.    Historical Provider, MD  BELSOMRA 10 MG TABS Take 10 mg by mouth at bedtime.  04/06/16   Historical Provider, MD  buPROPion (WELLBUTRIN SR) 100 MG 12 hr tablet Take 100 mg by mouth daily.  05/31/13   Historical Provider, MD  celecoxib (CELEBREX) 200 MG capsule Take 1 capsule by mouth daily.  05/21/16   Historical Provider, MD  cetirizine (ZYRTEC) 10 MG tablet Take 10 mg by mouth daily.    Historical Provider, MD  Cholecalciferol (VITAMIN D3) 50000 units CAPS Take 50,000 Units by mouth every 7 (seven) days.    Historical Provider, MD  clopidogrel (PLAVIX) 75 MG tablet Take 75 mg by mouth once.  05/31/13   Historical Provider, MD  docusate sodium (COLACE) 100 MG capsule Take 100 mg by mouth 3 (three) times daily.    Historical Provider, MD  furosemide (LASIX) 20 MG tablet Take 20 mg by mouth 2 (two) times daily.  05/31/13   Historical Provider, MD  gabapentin (NEURONTIN) 400 MG capsule Take 400  mg by mouth 3 (three) times daily.  05/31/13   Historical Provider, MD  hydrALAZINE (APRESOLINE) 25 MG tablet Take 25 mg by mouth 3 (three) times daily.     Historical Provider, MD  ibuprofen (ADVIL,MOTRIN) 600 MG tablet Take 1 tablet (600 mg total) by mouth every 8 (eight) hours as needed. 07/20/16   Darel Hong, MD  ipratropium-albuterol (DUONEB) 0.5-2.5 (3) MG/3ML SOLN Inhale 3 mLs into the lungs every 4 (four) hours as needed. 05/01/16   Historical Provider, MD  isosorbide mononitrate (IMDUR) 60 MG 24 hr tablet Take 60 mg by mouth daily.  05/31/13   Historical Provider, MD  levothyroxine (SYNTHROID, LEVOTHROID) 100 MCG tablet Take 100 mcg by mouth daily before breakfast.  05/31/13   Historical Provider, MD  magnesium  oxide (MAG-OX) 400 MG tablet Take 400 mg by mouth daily.    Historical Provider, MD  meclizine (ANTIVERT) 25 MG tablet Take 25 mg by mouth 2 (two) times daily as needed.  05/31/13   Historical Provider, MD  metoprolol (LOPRESSOR) 50 MG tablet Take 50 mg by mouth 2 (two) times daily.  05/31/13   Historical Provider, MD  montelukast (SINGULAIR) 10 MG tablet Take 1 tablet by mouth at bedtime.     Historical Provider, MD  oxyCODONE (ROXICODONE) 5 MG immediate release tablet Take 1 tablet (5 mg total) by mouth every 4 (four) hours as needed for severe pain. 07/21/16   Merlyn Lot, MD  pantoprazole (PROTONIX) 40 MG tablet Take 1 tablet by mouth daily.    Historical Provider, MD  Polysaccharide Iron Complex (FERREX 150 PO) Take 1 capsule by mouth daily.    Historical Provider, MD  PROAIR HFA 108 [phone] Base) MCG/ACT inhaler Inhale 2 puffs into the lungs every 4 (four) hours as needed. 05/01/16   Historical Provider, MD  traMADol (ULTRAM) 50 MG tablet Take 2 tablets (100 mg total) by mouth every 6 (six) hours as needed for severe pain. 07/21/16   Merlyn Lot, MD  ziprasidone (GEODON) 60 MG capsule Take 60 mg by mouth 2 (two) times daily with a meal.  05/31/13   Historical Provider, MD    Allergies Cephalosporins; Dilaudid [hydromorphone hcl]; Hydrocodone-acetaminophen; Morphine and related; Tetracyclines & related; Penicillins; and Sulfa antibiotics  Family History  Problem Relation Age of Onset  . Diabetes Mother   . Heart attack Father   . Diabetes Brother   . Heart attack Brother     Social History Social History  Substance Use Topics  . Smoking status: Former Smoker    Quit date: 03/15/1963  . Smokeless tobacco: Never Used  . Alcohol use No    Review of Systems  Constitutional: Negative for fever. Cardiovascular: Positive for chest pain. Respiratory: Positive for shortness of breath. Gastrointestinal: Negative for abdominal pain, vomiting and diarrhea. Genitourinary: Negative for  dysuria. Musculoskeletal: Positive for right fibular fracture. Neurological: Negative for headaches, focal weakness or numbness.  10-point ROS otherwise negative.  ____________________________________________   PHYSICAL EXAM:  VITAL SIGNS: ED Triage Vitals [08/23/16 0251]  Enc Vitals Group     BP 166/65     Pulse 101     Resp 28     Temp 98.5     Temp src      SpO2 (!) 88 %    Constitutional: Alert and oriented. Moderate respiratory distress. Eyes: Conjunctivae are normal. Normal extraocular movements. ENT   Head: Normocephalic and atraumatic.   Nose: No congestion/rhinnorhea.   Mouth/Throat: Mucous membranes are moist.   Neck: No  stridor. Hematological/Lymphatic/Immunilogical: No cervical lymphadenopathy. Cardiovascular: tachycardic, regular rhythm.  No murmurs, rubs, or gallops.  Respiratory: Increased respiratory effort. Diffuse rhonchi. Tachypnea.  Gastrointestinal: Soft and non tender. No rebound. No guarding.  Genitourinary: Deferred Musculoskeletal: Right lower leg in boot. Right knee with bandage initially covering wound. Wound consists of two ulcerated areas, some surrounding erythema.  Neurologic:  Normal speech and language. No gross focal neurologic deficits are appreciated.  Skin:  Skin is warm, dry and intact. No rash noted.   ____________________________________________    LABS (pertinent positives/negatives)  Labs Reviewed  TROPONIN I - Abnormal; Notable for the following:       Result Value   Troponin I 0.08 (*)    All other components within normal limits  BRAIN NATRIURETIC PEPTIDE - Abnormal; Notable for the following:    B Natriuretic Peptide 124.0 (*)    All other components within normal limits  CBC WITH DIFFERENTIAL/PLATELET - Abnormal; Notable for the following:    WBC 15.2 (*)    Hemoglobin 12.1 (*)    HCT 38.4 (*)    MCH 25.4 (*)    MCHC 31.4 (*)    RDW 18.1 (*)    Neutro Abs 13.4 (*)    Lymphs Abs 0.6 (*)    Monocytes  Absolute 1.2 (*)    All other components within normal limits  BASIC METABOLIC PANEL - Abnormal; Notable for the following:    Sodium 134 (*)    Potassium 2.8 (*)    Chloride 93 (*)    Glucose, Bld 330 (*)    BUN 66 (*)    Creatinine, Ser 2.07 (*)    GFR calc non Af Amer 31 (*)    GFR calc Af Amer 35 (*)    All other components within normal limits  LACTIC ACID, PLASMA - Abnormal; Notable for the following:    Lactic Acid, Venous 3.2 (*)    All other components within normal limits  CULTURE, BLOOD (ROUTINE X 2)  CULTURE, BLOOD (ROUTINE X 2)  URINE CULTURE  CBC WITH DIFFERENTIAL/PLATELET  LACTIC ACID, PLASMA  APTT  HEPARIN LEVEL (UNFRACTIONATED)  PROTIME-INR  URINALYSIS, COMPLETE (UACMP) WITH MICROSCOPIC     ____________________________________________   EKG  I, Nance Pear, attending physician, personally viewed and interpreted this EKG  EKG Time: 0247 Rate: 106 Rhythm: sinus tachycardia Axis: normal Intervals: qtc 439 QRS: narrow ST changes: no st elevation Impression: abnormal ekg   ____________________________________________    RADIOLOGY  CXR IMPRESSION:  Elevation of the right hemidiaphragm. Mild bibasilar atelectasis or  scarring. Mild cardiomegaly.    CT angio PE IMPRESSION:  Positive for bilateral acute PE with moderate thromboembolic burden.  There is CT evidence of right heart strain (RV/LV Ratio = 1.26)  consistent with at least submassive (intermediate risk) PE. The  presence of right heart strain has been associated with an increased  risk of morbidity and mortality. Please activate Code PE by paging  [phone].   I, Nance Pear, personally discussed these images (CT) and results by phone with the on-call radiologist and used this discussion as part of my medical decision making.   ____________________________________________   PROCEDURES  Procedures  CRITICAL CARE Performed by: Nance Pear   Total critical  care time: 45 minutes  Critical care time was exclusive of separately billable procedures and treating other patients.  Critical care was necessary to treat or prevent imminent or life-threatening deterioration.  Critical care was time spent personally by me on the following activities: development  of treatment plan with patient and/or surrogate as well as nursing, discussions with consultants, evaluation of patient's response to treatment, examination of patient, obtaining history from patient or surrogate, ordering and performing treatments and interventions, ordering and review of laboratory studies, ordering and review of radiographic studies, pulse oximetry and re-evaluation of patient's condition.  ____________________________________________   INITIAL IMPRESSION / ASSESSMENT AND PLAN / ED COURSE  Pertinent labs & imaging results that were available during my care of the patient were reviewed by me and considered in my medical decision making (see chart for details).  Patient presented to the emergency department today because of concerns for shortness of breath. Patient was tachypnea, tachycardic and hypoxic upon arrival. He was placed on BiPAP. Patient clinical picture was concerning for pulmonary embolism in the setting of a recent fracture to his right fibula. CT angiogram was performed even with poor GFR. This did show bilateral embolisms with some right heart strain. Patient's treatment was a 0.08. Code PE was paged and I discussed with Dr. Jimmy Footman who recommended transfer to [place] where accepting physician would be Dr. Chase Caller.  In addition patient does have elevated lactic acid and wbc. Both could be due to PE however given wound on knee will cover with broad spectrum antibiotics.   ____________________________________________   FINAL CLINICAL IMPRESSION(S) / ED DIAGNOSES  Final diagnoses:  Hypoxia  Other pulmonary embolism without acute cor pulmonale, unspecified  chronicity (HCC)  Elevated lactic acid level     Note: This dictation was prepared with Dragon dictation. Any transcriptional errors that result from this process are unintentional     Nance Pear, MD 08/23/16 [phone]

## 2016-08-23 NOTE — Assessment & Plan Note (Signed)
He denies this but is in chart  Plan ppi To keep this mind while considering ekos

## 2016-08-23 NOTE — Assessment & Plan Note (Signed)
?   Cause  Plan Fluid challenge and reassess

## 2016-08-23 NOTE — ED Notes (Signed)
Called and gave report to Cochran at [facility].

## 2016-08-23 NOTE — Assessment & Plan Note (Signed)
Looks dry  Plan Fluid Hold lasix

## 2016-08-23 NOTE — Assessment & Plan Note (Signed)
ppi for sup

## 2016-08-23 NOTE — Assessment & Plan Note (Signed)
Creat 1.35 in feb 2018 Creat 2mg m% this admit 08/23/2016; associate with lactic acidosis  Plan Hydrate and reasess

## 2016-08-23 NOTE — ED Notes (Signed)
[facility] at bedside preparing to transfer pt.

## 2016-08-23 NOTE — Assessment & Plan Note (Signed)
Severe low K at [facility] admit. Not repleted. Making urine per [place] [facility]

## 2016-08-23 NOTE — Assessment & Plan Note (Addendum)
No wheeze. Astma mentioned in chart  Plan duoneb and pulmicort neb Restart singulair

## 2016-08-23 NOTE — ED Triage Notes (Signed)
Pt. Here from home for respirtory distress.  Pt. SATS were in upper 80's upon EMS arrival.  Pt. Put on C-pap.  Pt. Has hx of CHF.  Pt. Had been at [facility] for the past 30 days for rehab on rt. Foot.  Pt. Was discharged today.

## 2016-08-23 NOTE — ED Notes (Signed)
Report given to [facility] 

## 2016-08-23 NOTE — Progress Notes (Signed)
[place] for heparin dosing Indication: pulmonary embolus  Allergies  Allergen Reactions  . Cephalosporins Other (See Comments)    Patient doesn't recall the reaction  . Dilaudid [Hydromorphone Hcl] Other (See Comments)    "CANNOT HEAR, CANNOT THINK"    . Hydrocodone-Acetaminophen Nausea And Vomiting  . Morphine And Related Other (See Comments)    Patient was told by a doctor that this "would kill" him  . Tetracycline Other (See Comments)    Patient was told by a doctor that this "would kill" him  . Tetracyclines & Related Other (See Comments)    Patient doesn't recall the reaction  . Penicillins Rash    Has patient had a PCN reaction causing immediate rash, facial/tongue/throat swelling, SOB or lightheadedness with hypotension: Yes Has patient had a PCN reaction causing severe rash involving mucus membranes or skin necrosis: No Has patient had a PCN reaction that required hospitalization No Has patient had a PCN reaction occurring within the last 10 years: No If all of the above answers are "NO", then may proceed with Cephalosporin use.   . Sulfa Antibiotics Rash    Patient Measurements: Height: 5\' 7"  (170.2 cm) Weight: (!) 351 lb 3.1 oz (159.3 kg) IBW/kg (Calculated) : 66.1 Heparin Dosing Weight: 107.8 kg  Vital Signs: Temp: 98.8 F (37.1 C) (04/01 1617) Temp Source: Oral (04/01 1617) BP: 114/69 (04/01 1630) Pulse Rate: 93 (04/01 1630)  Labs:  Recent Labs  08/23/16 0303 08/23/16 0543 08/23/16 1124 08/23/16 1638 08/23/16 1654  HGB 12.1*  --  10.4*  --  10.3*  HCT 38.4*  --  34.5*  --  34.4*  PLT 335  --  283  --  271  APTT  --  26  --   --   --   LABPROT  --  14.5 15.4*  --   --   INR  --  1.12 1.22  --   --   HEPARINUNFRC  --  <0.10* 0.72*  --  0.74*  CREATININE 2.07*  --  1.64*  --   --   TROPONINI 0.08*  --  0.08* 0.13*  --     Estimated Creatinine Clearance: 60.4 mL/min (A) (by C-G formula based on SCr of 1.64  mg/dL (H)).   Medical History: Past Medical History:  Diagnosis Date  . Arthritis   . Asthma   . CHF (congestive heart failure) ([place])   . Diabetes ([place])   . Heart trouble   . History of stomach ulcers   . Hypertension   . S/P CABG x 2   . Skin cancer   . Stroke [facility])     Medications:  Scheduled:  . alteplase (tPA/ ACTIVASE) PE Lysis 12 mg/250 mL (BILATERAL)  12 mg Intravenous Once  . alteplase (tPA/ ACTIVASE) PE Lysis 12 mg/250 mL (BILATERAL)  12 mg Intravenous Once  . budesonide (PULMICORT) nebulizer solution  0.25 mg Nebulization BID  . buPROPion  100 mg Oral BID  . ipratropium-albuterol  3 mL Nebulization Q4H  . montelukast  10 mg Oral QHS  . pantoprazole  40 mg Oral Daily  . sodium chloride flush  3 mL Intravenous Q12H  . ziprasidone  60 mg Oral BID WC    Assessment: Patient admitted for SOB and found to have bilateral PE on CT with R heart strain Recent immobility due to leg fracture Started on high heparin rate at outside hospital Level remains supratherapeutic despite rate reduction, though most recent level was  drawn early Discussed with RN, no issues with infusion Pt was started on EKOS this afternoon with bilateral catheter-directed thrombolysis infusing Fibrinogen stable hgb 10.4, plts ok   Goal of Therapy:  Heparin level 0.3-0.7 units/ml Monitor platelets by anticoagulation protocol: Yes    Plan:  -Reduce heparin to 1500 units/hr -Daily HL, CBC -Check level tonight    Hughes Better, PharmD, BCPS Clinical Pharmacist 08/23/2016 7:40 PM

## 2016-08-23 NOTE — Procedures (Signed)
Bilateral pulmonary artery US-assisted catheter-directed thrombolytic therapy initiated via R IJ  Pre infusion PA pressures 75/88(32)PQDI No complication No blood loss. See complete dictation in [facility].  Will infuse overnight, recheck pressures in AM.

## 2016-08-23 NOTE — ED Notes (Signed)
Redraw of green top BMP sent to lab.

## 2016-08-23 NOTE — Assessment & Plan Note (Signed)
icu hyerglycemia protocol

## 2016-08-23 NOTE — Progress Notes (Signed)
This note also relates to the following rows which could not be included: Resp - Cannot attach notes to unvalidated device data  Pt taken off Bipap and placed on [place] 4 Lpm for a trial by RT, per MD request. Pt tolerating well at this time.

## 2016-08-23 NOTE — Assessment & Plan Note (Addendum)
On geodon and wellbutrin per chart  Plan Restart but renal correct

## 2016-08-23 NOTE — Assessment & Plan Note (Signed)
Due to baseline OSA, ? Copd/asthma chronic diast chf and now acute submassive PE Looks like he can come off day time bipap  Plan Change bipap to [place] [facility] schedule

## 2016-08-24 ENCOUNTER — Inpatient Hospital Stay (HOSPITAL_COMMUNITY): Payer: Medicare Other

## 2016-08-24 ENCOUNTER — Encounter (HOSPITAL_COMMUNITY): Payer: Self-pay | Admitting: Student

## 2016-08-24 ENCOUNTER — Ambulatory Visit: Payer: Medicare Other | Admitting: Podiatry

## 2016-08-24 DIAGNOSIS — L8990 Pressure ulcer of unspecified site, unspecified stage: Secondary | ICD-10-CM | POA: Diagnosis present

## 2016-08-24 DIAGNOSIS — I2699 Other pulmonary embolism without acute cor pulmonale: Secondary | ICD-10-CM

## 2016-08-24 DIAGNOSIS — R739 Hyperglycemia, unspecified: Secondary | ICD-10-CM

## 2016-08-24 DIAGNOSIS — R0902 Hypoxemia: Secondary | ICD-10-CM

## 2016-08-24 HISTORY — PX: IR THROMB F/U EVAL ART/VEN FINAL DAY (MS): IMG5379

## 2016-08-24 LAB — CBC
HCT: 32.9 % — ABNORMAL LOW (ref 39.0–52.0)
HCT: 33.6 % — ABNORMAL LOW (ref 39.0–52.0)
HEMOGLOBIN: 9.8 g/dL — AB (ref 13.0–17.0)
Hemoglobin: 9.6 g/dL — ABNORMAL LOW (ref 13.0–17.0)
MCH: 24.6 pg — ABNORMAL LOW (ref 26.0–34.0)
MCH: 24.7 pg — AB (ref 26.0–34.0)
MCHC: 29.2 g/dL — ABNORMAL LOW (ref 30.0–36.0)
MCHC: 29.2 g/dL — ABNORMAL LOW (ref 30.0–36.0)
MCV: 84.1 fL (ref 78.0–100.0)
MCV: 84.6 fL (ref 78.0–100.0)
PLATELETS: 230 10*3/uL (ref 150–400)
PLATELETS: 239 10*3/uL (ref 150–400)
RBC: 3.91 MIL/uL — ABNORMAL LOW (ref 4.22–5.81)
RBC: 3.97 MIL/uL — AB (ref 4.22–5.81)
RDW: 17.1 % — ABNORMAL HIGH (ref 11.5–15.5)
RDW: 16.5 % — ABNORMAL HIGH (ref 11.5–15.5)
WBC: 8.7 10*3/uL (ref 4.0–10.5)
WBC: 8.8 10*3/uL (ref 4.0–10.5)

## 2016-08-24 LAB — GLUCOSE, CAPILLARY
GLUCOSE-CAPILLARY: 162 mg/dL — AB (ref 65–99)
Glucose-Capillary: 186 mg/dL — ABNORMAL HIGH (ref 65–99)
Glucose-Capillary: 164 mg/dL — ABNORMAL HIGH (ref 65–99)

## 2016-08-24 LAB — URINE CULTURE

## 2016-08-24 LAB — HEPARIN LEVEL (UNFRACTIONATED)
HEPARIN UNFRACTIONATED: 0.53 [IU]/mL (ref 0.30–0.70)
Heparin Unfractionated: 0.49 IU/mL (ref 0.30–0.70)
Heparin Unfractionated: 0.43 IU/mL (ref 0.30–0.70)

## 2016-08-24 LAB — PHOSPHORUS: Phosphorus: 3 mg/dL (ref 2.5–4.6)

## 2016-08-24 LAB — MAGNESIUM: Magnesium: 2.7 mg/dL — ABNORMAL HIGH (ref 1.7–2.4)

## 2016-08-24 LAB — BASIC METABOLIC PANEL
ANION GAP: 10 (ref 5–15)
BUN: 28 mg/dL — ABNORMAL HIGH (ref 6–20)
CALCIUM: 8.6 mg/dL — AB (ref 8.9–10.3)
CO2: 28 mmol/L (ref 22–32)
Chloride: 103 mmol/L (ref 101–111)
Creatinine, Ser: 1.17 mg/dL (ref 0.61–1.24)
Glucose, Bld: 162 mg/dL — ABNORMAL HIGH (ref 65–99)
Potassium: 3 mmol/L — ABNORMAL LOW (ref 3.5–5.1)
Sodium: 141 mmol/L (ref 135–145)

## 2016-08-24 LAB — FIBRINOGEN
Fibrinogen: 369 mg/dL (ref 210–475)
Fibrinogen: 362 mg/dL (ref 210–475)

## 2016-08-24 MED ORDER — INSULIN ASPART 100 UNIT/ML ~~LOC~~ SOLN: 0.0000 [IU] | Freq: Three times a day (TID) | SUBCUTANEOUS | Status: DC

## 2016-08-24 MED ORDER — GABAPENTIN 400 MG PO CAPS
400.0000 mg | ORAL_CAPSULE | Freq: Three times a day (TID) | ORAL | Status: DC
  Administered 2016-08-24 – 2016-08-26 (×5): 400 mg via ORAL
  Filled 2016-08-24 (×5): qty 1

## 2016-08-24 MED ORDER — CHLORHEXIDINE GLUCONATE CLOTH 2 % EX PADS
6.0000 | MEDICATED_PAD | Freq: Every day | CUTANEOUS | Status: DC
  Administered 2016-08-24 – 2016-08-26 (×3): 6 via TOPICAL

## 2016-08-24 MED ORDER — MUPIROCIN 2 % EX OINT
1.0000 "application " | TOPICAL_OINTMENT | Freq: Two times a day (BID) | CUTANEOUS | Status: DC
  Administered 2016-08-24 – 2016-08-27 (×7): 1 via NASAL
  Filled 2016-08-24 (×3): qty 22

## 2016-08-24 MED ORDER — MUPIROCIN CALCIUM 2 % EX CREA
TOPICAL_CREAM | Freq: Every day | CUTANEOUS | Status: DC
  Administered 2016-08-24 – 2016-08-27 (×4): via TOPICAL
  Filled 2016-08-24: qty 15

## 2016-08-24 MED ORDER — INSULIN ASPART 100 UNIT/ML ~~LOC~~ SOLN
0.0000 [IU] | Freq: Three times a day (TID) | SUBCUTANEOUS | Status: DC
  Administered 2016-08-24 – 2016-08-25 (×3): 3 [IU] via SUBCUTANEOUS

## 2016-08-24 MED ORDER — POTASSIUM CHLORIDE CRYS ER 20 MEQ PO TBCR
40.0000 meq | EXTENDED_RELEASE_TABLET | Freq: Once | ORAL | Status: AC
  Administered 2016-08-24: 40 meq via ORAL
  Filled 2016-08-24: qty 2

## 2016-08-24 NOTE — Progress Notes (Signed)
ANTICOAGULATION CONSULT NOTE - Follow Up Consult  Pharmacy Consult for Heparin  Indication: pulmonary embolus, on EKOS protocol  Assessment: 71 y/o M with bilateral PE on EKOS protocol. Pharmacy consulted for heparin management. Heparin level therapeutic this AM x 1 after rate decrease. Confirmatory heparin level also therapeutic (0.49) on 1500 units/hr. Hemoglobin down to 9.8, platelets wnl. Some bleeding noted around R IJ catheter insertion site.   Goal of Therapy:  Heparin level 0.3-0.7 units/ml Monitor platelets by anticoagulation protocol: Yes   Plan:  -Cont heparin 1500 units/hr -Daily heparin levels and CBC -Follow-up long-term anticoagulation plans   Patient Measurements: Height: 5\' 7"  (170.2 cm) Weight: (!) 352 lb 1.2 oz (159.7 kg) IBW/kg (Calculated) : 66.1  Vital Signs: Temp: 98.7 F (37.1 C) (04/02 1152) Temp Source: Oral (04/02 1152) BP: 156/51 (04/02 1200) Pulse Rate: 92 (04/02 1200)  Labs:  Recent Labs  08/23/16 0303  08/23/16 0543 08/23/16 1124 08/23/16 1638 08/23/16 1654 08/23/16 2238 08/23/16 2254 08/24/16 0220 08/24/16 0454 08/24/16 1000 08/24/16 1048  HGB 12.1*  --   --  10.4*  --  10.3*  --  10.0*  --  9.6*  --  9.8*  HCT 38.4*  --   --  34.5*  --  34.4*  --  33.8*  --  32.9*  --  33.6*  PLT 335  --   --  283  --  271  --  246  --  230  --  239  APTT  --   --  26  --   --   --   --   --   --   --   --   --   LABPROT  --   --  14.5 15.4*  --   --   --   --   --   --   --   --   INR  --   --  1.12 1.22  --   --   --   --   --   --   --   --   HEPARINUNFRC  --   < > <0.10* 0.72*  --  0.74*  --   --  0.53  --  0.49  --   CREATININE 2.07*  --   --  1.64*  --   --   --   --   --  1.17  --   --   TROPONINI 0.08*  --   --  0.08* 0.13*  --  0.10*  --   --   --   --   --   < > = values in this interval not displayed.  Estimated Creatinine Clearance: 84.8 mL/min (by C-G formula based on SCr of 1.17 mg/dL).   Belia Heman, PharmD PGY1 Pharmacy  Resident [phone] (Pager) 08/24/2016 1:32 PM

## 2016-08-24 NOTE — Consult Note (Addendum)
[place] Nurse wound consult note Reason for Consult: Consult requested for right knee.  Pt states he fell at home on carpet. Wound type: 2 areas of full thickness wounds, 1.5X1.5X.2cm and 2.2X2.8X.3cm; bith sites are dark red and fluctuant, small amt yellow drainage, no odor 1 area of partial thickness wound; 1.2X1.2X.1cm, pink and moist, no odor, drainage, or fluctuance Dressing procedure/placement/frequency: It is difficult to promote healing of full thickness wounds over knees related to the constant motion of the joint and sometimes a skin substitute is needed to promote healing.  Pt could benefit from follow-up at the outpatient wound center after discharge; please provide a physician referral at that time if desired.  No family members present to discuss plan of care. Bactroban to promote moist healing and provide antimicrobial benefits, foam dressing to protect from further injury. Please re-consult if further assistance is needed.  Thank-you,  Julien Girt MSN, [place], [place], [place], [place]

## 2016-08-24 NOTE — Progress Notes (Signed)
ANTICOAGULATION CONSULT NOTE - FOLLOW UP    HL = 0.43 (goal 0.3 - 0.7 units/mL) Heparin dosing weight = 106 kg   Assessment: 70 YOM with bilateral PE, now s/p EKOS protocol.  Patient continues on IV heparin and heparin level is therapeutic.  No bleeding per RN.   Plan: - Continue heparin gtt at 1500 units/hr - F/U AM labs    Shermaine Brigham D. Mina Marble, PharmD, BCPS 08/24/2016, 7:11 PM

## 2016-08-24 NOTE — Progress Notes (Signed)
Patient ID: [name], male   DOB: Aug 30, 1945, 71 y.o.   MRN: [id number]    Referring Physician(s): Dr. Brand Males  Supervising Physician: Corrie Mckusick  Patient Status: [facility] - In-pt  Chief Complaint: PE  Subjective: Patient feels like he is breathing better today.  No other complaints except hungry.  Allergies: Cephalosporins; Dilaudid [hydromorphone hcl]; Hydrocodone-acetaminophen; Morphine and related; Tetracycline; Tetracyclines & related; Penicillins; and Sulfa antibiotics  Medications: Prior to Admission medications   Medication Sig Start Date End Date Taking? Authorizing Provider  albuterol (PROAIR HFA) 108 (90 Base) MCG/ACT inhaler Inhale 2 puffs into the lungs every 4 (four) hours as needed for wheezing or shortness of breath.   Yes Historical Provider, MD  amLODipine (NORVASC) 10 MG tablet Take 10 mg by mouth daily.  05/31/13  Yes Historical Provider, MD  aspirin EC 81 MG tablet Take 81 mg by mouth daily.   Yes Historical Provider, MD  atorvastatin (LIPITOR) 80 MG tablet Take 80 mg by mouth every evening.   Yes Historical Provider, MD  buPROPion (WELLBUTRIN SR) 100 MG 12 hr tablet Take 100 mg by mouth daily.  05/31/13  Yes Historical Provider, MD  celecoxib (CELEBREX) 200 MG capsule Take 200 mg by mouth 2 (two) times daily.  05/21/16  Yes Historical Provider, MD  cetirizine (ZYRTEC) 10 MG tablet Take 10 mg by mouth daily.   Yes Historical Provider, MD  Cholecalciferol (VITAMIN D3) 50000 units CAPS Take 50,000 Units by mouth every Thursday.    Yes Historical Provider, MD  clopidogrel (PLAVIX) 75 MG tablet Take 75 mg by mouth daily.  05/31/13  Yes Historical Provider, MD  collagenase (SANTYL) ointment Apply 1 application topically See admin instructions. Apply to wound bed (nickel thick) daily for wound care   Yes Historical Provider, MD  docusate sodium (COLACE) 100 MG capsule Take 100 mg by mouth 3 (three) times daily.   Yes Historical Provider, MD  fluticasone  (FLONASE) 50 MCG/ACT nasal spray Place 1 spray into both nostrils daily.   Yes Historical Provider, MD  Fluticasone-Salmeterol (ADVAIR) 100-50 MCG/DOSE AEPB Inhale 1 puff into the lungs 2 (two) times daily.   Yes Historical Provider, MD  furosemide (LASIX) 40 MG tablet Take 40 mg by mouth 2 (two) times daily.   Yes Historical Provider, MD  gabapentin (NEURONTIN) 400 MG capsule Take 400 mg by mouth 3 (three) times daily.  05/31/13  Yes Historical Provider, MD  hydrALAZINE (APRESOLINE) 25 MG tablet Take 25 mg by mouth 3 (three) times daily.    Yes Historical Provider, MD  hydrochlorothiazide (MICROZIDE) 12.5 MG capsule Take 12.5 mg by mouth daily.   Yes Historical Provider, MD  ipratropium-albuterol (DUONEB) 0.5-2.5 (3) MG/3ML SOLN Inhale 3 mLs into the lungs every 4 (four) hours as needed (shortness of breath/wheezing).  05/01/16  Yes Historical Provider, MD  iron polysaccharides (NIFEREX) 150 MG capsule Take 150 mg by mouth daily.   Yes Historical Provider, MD  isosorbide mononitrate (IMDUR) 60 MG 24 hr tablet Take 60 mg by mouth daily.  05/31/13  Yes Historical Provider, MD  levothyroxine (SYNTHROID, LEVOTHROID) 100 MCG tablet Take 100 mcg by mouth daily before breakfast.  05/31/13  Yes Historical Provider, MD  magnesium oxide (MAG-OX) 400 MG tablet Take 400 mg by mouth daily.   Yes Historical Provider, MD  meclizine (ANTIVERT) 25 MG tablet Take 25 mg by mouth 2 (two) times daily as needed for dizziness.  05/31/13  Yes Historical Provider, MD  metFORMIN (GLUCOPHAGE) 500 MG tablet Take  500 mg by mouth 2 (two) times daily with a meal.   Yes Historical Provider, MD  metolazone (ZAROXOLYN) 5 MG tablet Take 5 mg by mouth daily.   Yes Historical Provider, MD  metoprolol (LOPRESSOR) 50 MG tablet Take 50 mg by mouth 2 (two) times daily.  05/31/13  Yes Historical Provider, MD  montelukast (SINGULAIR) 10 MG tablet Take 1 tablet by mouth at bedtime.    Yes Historical Provider, MD  nystatin (NYSTATIN) powder Apply  topically See admin instructions. Apply to abdominal folds and groin topically twice daily   Yes Historical Provider, MD  oxyCODONE (ROXICODONE) 5 MG immediate release tablet Take 1 tablet (5 mg total) by mouth every 4 (four) hours as needed for severe pain. 07/21/16  Yes Merlyn Lot, MD  pantoprazole (PROTONIX) 40 MG tablet Take 40 mg by mouth daily.    Yes Historical Provider, MD  senna-docusate (SENOKOT-S) 8.6-50 MG tablet Take 2 tablets by mouth daily.   Yes Historical Provider, MD  Suvorexant (BELSOMRA) 10 MG TABS Take 10 mg by mouth at bedtime.   Yes Historical Provider, MD  torsemide (DEMADEX) 10 MG tablet Take 10 mg by mouth daily.   Yes Historical Provider, MD  ziprasidone (GEODON) 80 MG capsule Take 80 mg by mouth 2 (two) times daily with a meal.   Yes Historical Provider, MD  ibuprofen (ADVIL,MOTRIN) 600 MG tablet Take 1 tablet (600 mg total) by mouth every 8 (eight) hours as needed. Patient not taking: Reported on 08/23/2016 07/20/16   Darel Hong, MD  traMADol (ULTRAM) 50 MG tablet Take 2 tablets (100 mg total) by mouth every 6 (six) hours as needed for severe pain. Patient not taking: Reported on 08/23/2016 07/21/16   Merlyn Lot, MD    Vital Signs: BP (!) 142/57   Pulse 89   Temp 98.8 F (37.1 C) (Oral)   Resp (!) 23   Ht 5\' 7"  (1.702 m)   Wt (!) 352 lb 1.2 oz (159.7 kg)   SpO2 98%   BMI 55.14 kg/m   Physical Exam: Neck: catheters still in place currently.  Some clotted blood around the catheters, but no active bleeding.    Imaging: Ct Angio Chest Pe W And/or Wo Contrast  Result Date: 08/23/2016 CLINICAL DATA:  Shortness of breath. EXAM: CT ANGIOGRAPHY CHEST WITH CONTRAST TECHNIQUE: Multidetector CT imaging of the chest was performed using the standard protocol during bolus administration of intravenous contrast. Multiplanar CT image reconstructions and MIPs were obtained to evaluate the vascular anatomy. CONTRAST:  75 cc Isovue 370 IV COMPARISON:  Chest radiograph  earlier this day.  Chest CT 03/02/2015 FINDINGS: Cardiovascular: Examination is positive for bilateral acute pulmonary emboli. Filling defects on the right involves the distal main, upper and lower lobar arteries extending into the subsegmental branches. On the left thrombus within the distal main, upper and lower lobar artery is. There is evidence of right heart strain RV to LV ratio 1.26. Thoracic aorta is normal in caliber, patient is post CABG. Common origin of the brachiocephalic and left common carotid artery Mediastinum/Nodes: Mediastinal lipomatosis. No adenopathy. Visualized thyroid gland is unremarkable. The esophagus is decompressed. Lungs/Pleura: Breathing motion artifact obscures evaluation particularly at the lung bases. Subsegmental linear atelectasis in the left lower lobe and lingula. 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 Subpleural fat on the left, no pleural effusion. No evidence pulmonary infarct. Upper Abdomen: No acute abnormality.  Right renal cyst. Musculoskeletal: There are no  acute or suspicious osseous abnormalities. Degenerative change in the thoracic spine with bridging anterior osteophytes. Review of the MIP images confirms the above findings. IMPRESSION: Positive for bilateral acute PE with moderate thromboembolic burden. There is CT evidence of right heart strain (RV/LV Ratio = 1.26) consistent with at least submassive (intermediate risk) PE. The presence of right heart strain has been associated with an increased risk of morbidity and mortality. Please activate Code PE by paging [phone]. Critical Value/emergent results were called by telephone at the time of interpretation on 08/23/2016 at 5:09 am to Dr. Nance Pear , who verbally acknowledged these results. Electronically Signed   By: Jeb Levering M.D.   On: 08/23/2016 05:10   Dg Chest Port 1 View  Result Date: 08/24/2016 CLINICAL DATA:  71 year old male  with pulmonary embolus. Lysis. Subsequent encounter. EXAM: PORTABLE CHEST 1 VIEW COMPARISON:  None. FINDINGS: Pulmonary artery catheters in place with tips projecting at the level of the right lower lobe pulmonary artery and left lower lobe pulmonary artery. No pneumothorax. Pulmonary vascular congestion. Elevated right hemidiaphragm with right base subsegmental atelectasis. Cardiomegaly. Fractured sternal wires. IMPRESSION: Pulmonary artery catheters in place at the level of the right lower lobe pulmonary artery and left lower lobe pulmonary artery. Pulmonary vascular congestion. Elevated right hemidiaphragm with right base subsegmental atelectasis. Cardiomegaly. Electronically Signed   By: Genia Del M.D.   On: 08/24/2016 10:41   Dg Chest Port 1 View  Result Date: 08/23/2016 CLINICAL DATA:  Bilateral pulmonary emboli. EXAM: PORTABLE CHEST 1 VIEW COMPARISON:  Chest radiographs and chest CTA obtained earlier today FINDINGS: Stable mildly enlarged cardiac silhouette and post CABG changes. Stable elevation of the right hemidiaphragm. Clear lungs. Thoracic spine degenerative changes. IMPRESSION: No acute abnormality.  Stable cardiomegaly Electronically Signed   By: Claudie Revering M.D.   On: 08/23/2016 10:56   Dg Chest Portable 1 View  Result Date: 08/23/2016 CLINICAL DATA:  Acute onset of respiratory distress. Decreased O2 saturation. Initial encounter. EXAM: PORTABLE CHEST 1 VIEW COMPARISON:  Chest radiograph performed 07/13/2016 FINDINGS: The lungs are well-aerated. There is elevation of the right hemidiaphragm. Mild bibasilar atelectasis or scarring is noted. There is no evidence of pleural effusion or pneumothorax. The cardiomediastinal silhouette is mildly enlarged. The patient is status post median sternotomy. No acute osseous abnormalities are seen. IMPRESSION: Elevation of the right hemidiaphragm. Mild bibasilar atelectasis or scarring. Mild cardiomegaly. Electronically Signed   By: Garald Balding M.D.    On: 08/23/2016 03:15    Labs:  CBC:  Recent Labs  08/23/16 1654 08/23/16 2254 08/24/16 0454 08/24/16 1048  WBC 11.0* 10.0 8.7 8.8  HGB 10.3* 10.0* 9.6* 9.8*  HCT 34.4* 33.8* 32.9* 33.6*  PLT 271 246 230 239    COAGS:  Recent Labs  02/16/16 1551 08/23/16 0543 08/23/16 1124  INR 1.03 1.12 1.22  APTT  --  26  --     BMP:  Recent Labs  07/21/16 1607 08/23/16 0303 08/23/16 1124 08/24/16 0454  NA 135 134* 135 141  K 4.0 2.8* 2.9* 3.0*  CL 104 93* 95* 103  CO2 24 28 25 28   GLUCOSE 212* 330* 194* 162*  BUN 20 66* 50* 28*  CALCIUM 8.2* 9.3 8.8* 8.6*  CREATININE 1.35* 2.07* 1.64* 1.17  GFRNONAA 51* 31* 40* >60  GFRAA 59* 35* 47* >60    LIVER FUNCTION TESTS:  Recent Labs  04/26/16 2220 07/13/16 2316 07/21/16 1607 08/23/16 1124  BILITOT 0.5 0.4 0.4 0.8  AST 44* 28 27  49*  ALT 28 29 21 24   ALKPHOS 114 76 72 101  PROT 7.0 6.8 6.5 7.1  ALBUMIN 3.0* 3.2* 3.2* 3.3*    Assessment and Plan: 1. Submassive bilateral pulmonary emboli, s/p EKOS yesterday -we will plan to remove the catheters today after getting final pressures. -patient is currently feeling better today with decrease in SOB -cont heparin and further care per primary service.  Electronically Signed: Henreitta Cea 08/24/2016, 11:28 AM   I spent a total of 15 Minutes at the the patient's bedside AND on the patient's hospital floor or unit, greater than 50% of which was counseling/coordinating care for submassive bilateral PEs

## 2016-08-24 NOTE — Progress Notes (Signed)
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 Compliance admitted 08/23/16 to [facility] ER with respiratory failure with hypoxia, lactic acidosis, AKI and was found to have submassive PE. Started on BiPAP --> [place]. S/p EKOS 08/23/16. On heparin.  LINES / TUBES: R IC 4/1 --> Peripheral IV BL 4/1  CULTURES: +MRSA BCx NGTD  ANTIBIOTICS: None  SIGNIFICANT EVENTS:  CTA 4/1: Acute bilateral PE EKOS 08/23/16  LEVEL OF CARE:  ICU PRIMARY SERVICE:  PCCM CONSULTANTS:  V-IR CODE STATUS: FULL DIET:  NPO except sips with meds DVT Px:  heparin GI Px: Pantoprazole 40  PAST MEDICAL HISTORY :  Past Medical History:  Diagnosis Date  . Arthritis   . Asthma   . CHF (congestive heart failure) ([place])   . Diabetes ([place])   . Heart trouble   . History of stomach ulcers   . Hypertension   . S/P CABG x 2   . Skin cancer   . Stroke [facility])    Past Surgical History:  Procedure Laterality Date  . CARDIAC SURGERY    . CHOLECYSTECTOMY    . GALLBLADDER SURGERY    . HEART BYPASS    . HERNIA REPAIR    . KIDNEY SURGERY     Prior to Admission medications   Medication Sig Start Date End Date Taking? Authorizing Provider  albuterol (PROAIR HFA) 108 (90 Base) MCG/ACT inhaler Inhale 2 puffs into the lungs every 4 (four) hours as needed for wheezing or shortness of breath.   Yes Historical Provider, MD  amLODipine (NORVASC) 10 MG tablet Take 10 mg by mouth daily.  05/31/13  Yes Historical Provider, MD  aspirin EC 81 MG tablet Take 81 mg by mouth daily.   Yes Historical Provider, MD  atorvastatin (LIPITOR) 80 MG tablet Take 80 mg by mouth every evening.   Yes Historical Provider, MD  buPROPion (WELLBUTRIN SR)  100 MG 12 hr tablet Take 100 mg by mouth daily.  05/31/13  Yes Historical Provider, MD  celecoxib (CELEBREX) 200 MG capsule Take 200 mg by mouth 2 (two) times daily.  05/21/16  Yes Historical Provider, MD  cetirizine (ZYRTEC) 10 MG tablet Take 10 mg by mouth daily.   Yes Historical Provider, MD  Cholecalciferol (VITAMIN D3) 50000 units CAPS Take 50,000 Units by mouth every Thursday.    Yes Historical Provider, MD  clopidogrel (PLAVIX) 75 MG tablet Take 75 mg by mouth daily.  05/31/13  Yes Historical Provider, MD  collagenase (SANTYL) ointment Apply 1 application topically See admin instructions. Apply to wound bed (nickel thick) daily for wound care   Yes Historical Provider, MD  docusate sodium (COLACE) 100 MG capsule Take 100 mg by mouth 3 (three) times daily.   Yes Historical Provider, MD  fluticasone (FLONASE) 50 MCG/ACT nasal spray Place 1 spray into both nostrils daily.   Yes Historical Provider, MD  Fluticasone-Salmeterol (ADVAIR) 100-50 MCG/DOSE AEPB Inhale 1 puff into the lungs 2 (two) times daily.   Yes Historical Provider, MD  furosemide (LASIX) 40 MG tablet Take 40 mg by mouth 2 (two) times daily.   Yes Historical Provider, MD  gabapentin (NEURONTIN) 400 MG  capsule Take 400 mg by mouth 3 (three) times daily.  05/31/13  Yes Historical Provider, MD  hydrALAZINE (APRESOLINE) 25 MG tablet Take 25 mg by mouth 3 (three) times daily.    Yes Historical Provider, MD  hydrochlorothiazide (MICROZIDE) 12.5 MG capsule Take 12.5 mg by mouth daily.   Yes Historical Provider, MD  ipratropium-albuterol (DUONEB) 0.5-2.5 (3) MG/3ML SOLN Inhale 3 mLs into the lungs every 4 (four) hours as needed (shortness of breath/wheezing).  05/01/16  Yes Historical Provider, MD  iron polysaccharides (NIFEREX) 150 MG capsule Take 150 mg by mouth daily.   Yes Historical Provider, MD  isosorbide mononitrate (IMDUR) 60 MG 24 hr tablet Take 60 mg by mouth daily.  05/31/13  Yes Historical Provider, MD  levothyroxine (SYNTHROID,  LEVOTHROID) 100 MCG tablet Take 100 mcg by mouth daily before breakfast.  05/31/13  Yes Historical Provider, MD  magnesium oxide (MAG-OX) 400 MG tablet Take 400 mg by mouth daily.   Yes Historical Provider, MD  meclizine (ANTIVERT) 25 MG tablet Take 25 mg by mouth 2 (two) times daily as needed for dizziness.  05/31/13  Yes Historical Provider, MD  metFORMIN (GLUCOPHAGE) 500 MG tablet Take 500 mg by mouth 2 (two) times daily with a meal.   Yes Historical Provider, MD  metolazone (ZAROXOLYN) 5 MG tablet Take 5 mg by mouth daily.   Yes Historical Provider, MD  metoprolol (LOPRESSOR) 50 MG tablet Take 50 mg by mouth 2 (two) times daily.  05/31/13  Yes Historical Provider, MD  montelukast (SINGULAIR) 10 MG tablet Take 1 tablet by mouth at bedtime.    Yes Historical Provider, MD  nystatin (NYSTATIN) powder Apply topically See admin instructions. Apply to abdominal folds and groin topically twice daily   Yes Historical Provider, MD  oxyCODONE (ROXICODONE) 5 MG immediate release tablet Take 1 tablet (5 mg total) by mouth every 4 (four) hours as needed for severe pain. 07/21/16  Yes Merlyn Lot, MD  pantoprazole (PROTONIX) 40 MG tablet Take 40 mg by mouth daily.    Yes Historical Provider, MD  senna-docusate (SENOKOT-S) 8.6-50 MG tablet Take 2 tablets by mouth daily.   Yes Historical Provider, MD  Suvorexant (BELSOMRA) 10 MG TABS Take 10 mg by mouth at bedtime.   Yes Historical Provider, MD  torsemide (DEMADEX) 10 MG tablet Take 10 mg by mouth daily.   Yes Historical Provider, MD  ziprasidone (GEODON) 80 MG capsule Take 80 mg by mouth 2 (two) times daily with a meal.   Yes Historical Provider, MD  ibuprofen (ADVIL,MOTRIN) 600 MG tablet Take 1 tablet (600 mg total) by mouth every 8 (eight) hours as needed. Patient not taking: Reported on 08/23/2016 07/20/16   Darel Hong, MD  traMADol (ULTRAM) 50 MG tablet Take 2 tablets (100 mg total) by mouth every 6 (six) hours as needed for severe pain. Patient not taking:  Reported on 08/23/2016 07/21/16   Merlyn Lot, MD   Allergies  Allergen Reactions  . Cephalosporins Other (See Comments)    Patient doesn't recall the reaction  . Dilaudid [Hydromorphone Hcl] Other (See Comments)    "CANNOT HEAR, CANNOT THINK"    . Hydrocodone-Acetaminophen Nausea And Vomiting  . Morphine And Related Other (See Comments)    Patient was told by a doctor that this "would kill" him  . Tetracycline Other (See Comments)    Patient was told by a doctor that this "would kill" him  . Tetracyclines & Related Other (See Comments)    Patient doesn't recall the  reaction  . Penicillins Rash    Has patient had a PCN reaction causing immediate rash, facial/tongue/throat swelling, SOB or lightheadedness with hypotension: Yes Has patient had a PCN reaction causing severe rash involving mucus membranes or skin necrosis: No Has patient had a PCN reaction that required hospitalization No Has patient had a PCN reaction occurring within the last 10 years: No If all of the above answers are "NO", then may proceed with Cephalosporin use.   . Sulfa Antibiotics Rash    FAMILY HISTORY:  Family History  Problem Relation Age of Onset  . Diabetes Mother   . Heart attack Father   . Diabetes Brother   . Heart attack Brother    SOCIAL HISTORY:  reports that he quit smoking about 53 years ago. He has never used smokeless tobacco. He reports that he does not drink alcohol or use drugs.  INTERVAL HISTORY: S/p EKOS 4/1  VITAL SIGNS: Temp:  [98.1 F (36.7 C)-99.2 F (37.3 C)] 98.8 F (37.1 C) (04/02 0749) Pulse Rate:  [79-102] 87 (04/02 0810) Resp:  [16-37] 21 (04/02 0810) BP: (107-152)/(46-92) 130/63 (04/02 0810) SpO2:  [92 %-99 %] 98 % (04/02 0810) FiO2 (%):  [30 %] 30 % (04/02 0343) Weight:  [351 lb 3.1 oz (159.3 kg)-352 lb 1.2 oz (159.7 kg)] 352 lb 1.2 oz (159.7 kg) (04/02 0500) HEMODYNAMICS:   VENTILATOR SETTINGS: Vent Mode: BIPAP;PCV FiO2 (%):  [30 %] 30 % Set Rate:  [12  bmp] 12 bmp PEEP:  [5 cmH20] 5 cmH20 INTAKE / OUTPUT: Intake/Output      04/01 0701 - 04/02 0700 04/02 0701 - 04/03 0700   I.V. (mL/kg) 2753.2 (17.2)    IV Piggyback 500    Total Intake(mL/kg) 3253.2 (20.4)    Urine (mL/kg/hr) 3100    Total Output 3100     Net +153.2          Urine Occurrence 1 x     PHYSICAL EXAMINATION: General:  Morbidly obese, comfortable Neuro:  AOx3. Moves all 4 extremities. Not sedated.  HEENT:  [place]/AT without gross abnormality. On [place]. Cardiovascular:  RRR. No murmur. Not on pressors.  Lungs:  CTA BL, somewhat tachypenic  Abdomen:  Obese, soft.  Skin:  Intact in exposed areas.   LABS: Cbc  Recent Labs Lab 08/23/16 1654 08/23/16 2254 08/24/16 0454  WBC 11.0* 10.0 8.7  HGB 10.3* 10.0* 9.6*  HCT 34.4* 33.8* 32.9*  PLT 271 246 230    Chemistry   Recent Labs Lab 08/23/16 0303 08/23/16 1124 08/24/16 0454  NA 134* 135 141  K 2.8* 2.9* 3.0*  CL 93* 95* 103  CO2 28 25 28   BUN 66* 50* 28*  CREATININE 2.07* 1.64* 1.17  CALCIUM 9.3 8.8* 8.6*  MG  --  2.8* 2.7*  PHOS  --  3.3 3.0  GLUCOSE 330* 194* 162*    Liver fxn  Recent Labs Lab 08/23/16 1124  AST 49*  ALT 24  ALKPHOS 101  BILITOT 0.8  PROT 7.1  ALBUMIN 3.3*   coags  Recent Labs Lab 08/23/16 0543 08/23/16 1124  APTT 26  --   INR 1.12 1.22   Sepsis markers  Recent Labs Lab 08/23/16 0735 08/23/16 1124 08/23/16 1725  LATICACIDVEN 2.4* 2.8* 1.6  PROCALCITON  --  0.10  --    Cardiac markers  Recent Labs Lab 08/23/16 1124 08/23/16 1638 08/23/16 2238  TROPONINI 0.08* 0.13* 0.10*   BNP No results for input(s): PROBNP in the last 168 hours. ABG  No results for input(s): PHART, PCO2ART, PO2ART, HCO3, TCO2 in the last 168 hours.  CBG trend  Recent Labs Lab 08/23/16 0931  GLUCAP 218*    IMAGING: CTA 4/1: Acute bilatearl PE with moderate thromboembolic burden. Right heart strain (RV/LV ratio 1.26)  ECG 4/1: Sinus tach, rate 106.   DIAGNOSES: Principal  Problem:   Pulmonary embolism (HCC) Active Problems:   Diabetes (HCC)   Chronic diastolic CHF (congestive heart failure) (HCC)   Acute on chronic renal failure (HCC)   Acute on chronic respiratory failure (HCC)   Lactic acidosis   Electrolyte imbalance   Current use of proton pump inhibitor   OSA (obstructive sleep apnea)   History of bipolar disorder   History of gastric ulcer   History of asthma   History of chronic pain   Acute pulmonary embolism (HCC)   Pressure injury of skin   ASSESSMENT / PLAN:  PULMONARY ASSESSMENT: Acute on chronic respiratory failure 2/2 acute submassive BL PE: s/p EKOS 4/1. Heparinized. No hypotension, hypoxia but on O2 via . OSA: CPAP QHS at home PLAN:  Continue heparin, supl oxygen PRN.  -BiPAP qhs -Pulmicort, duonebs, singulair -Heparin  CARDIOVASCULAR ASSESSMENT: Chronic diastolic CHF, right heart strain from PE. BNP 136. Euvolemic/dry on exam. PLAN:  Euvolemic/dry. Holding pts home Lasix 40 mg PO BID. Unclear dry wt seems to fluctuate 330's-360's.   RENAL ASSESSMENT:  Acute on Chronic Renal Failure. Cr 2 on adm, 1.1 today. BL appears to be ~1.3 HypoK, 3.0.   PLAN:   Replace K PO KVO AM labs  GASTROINTESTINAL ASSESSMENT:  Remote hx of gastric ulcer PLAN:   Protonix prophylaxis  HEMATOLOGIC ASSESSMENT: Chronic normocytic anemia. Denies acute blood loss. Hb 12.1 on admission, 9.6 4/2 am. Hx of Fe-def anemia.  PLAN: Likely dilutional -Daily CBC -heparin   INFECTIOUS ASSESSMENT: +MRSA, Bcx NGTD PLAN: Chlorhexidine. Contact prec.   ENDOCRINE ASSESSMENT:  Type 2 Diabetes, not on home insulin. Serum glucose ~100s. On Metformin at home.  Hx of hypothyroidism not on synthroid. On 100 mcg synthroid at home PLAN:   Holding insulin, could start sensitive sliding scale were pt to become hyperglycemic however pt currently NPO with good glycemic control.  NEUROLOGIC ASSESSMENT:  Hx of chronic pain, on gabapentin and oxy IR at  home PLAN:  Not in pain, have held above. Could consider resuming.  I have personally obtained a history, examined the patient, evaluated laboratory and imaging results, formulated the assessment and plan and placed orders. CRITICAL CARE: The patient is critically ill with multiple organ systems failure and requires high complexity decision making for assessment and support, frequent evaluation and titration of therapies, application of advanced monitoring technologies and extensive interpretation of multiple databases. Critical Care Time devoted to patient care services described in this note is 30 minutes.   Janaisha Tolsma, D.O. Internal Medicine Resident, PGY1 Pager: [phone]  08/24/2016, 8:41 AM

## 2016-08-24 NOTE — Progress Notes (Signed)
Inpatient Diabetes Program Recommendations  AACE/ADA: New Consensus Statement on Inpatient Glycemic Control (2015)  Target Ranges:  Prepandial:   less than 140 mg/dL      Peak postprandial:   less than 180 mg/dL (1-2 hours)      Critically ill patients:  140 - 180 mg/dL   Review of Glycemic Control  Diabetes history: DM 2 Outpatient Diabetes medications: Metformin 500 mg BID Current orders for Inpatient glycemic control: None  Inpatient Diabetes Program Recommendations:   With patient hx of DM, consider obtaining CBGs and start Novolog Sensitive Correction TID.  Thanks,  Tama Headings RN, MSN, [facility] Inpatient Diabetes Coordinator Team Pager [phone] (8a-5p)

## 2016-08-24 NOTE — Progress Notes (Signed)
Preliminary results by tech - Venous Duplex Lower Ext. Completed.Right leg, positive for subacute deep vein thrombosis involving the right distal femoral vein, popliteal vein and posterior tibial veins. Left leg, Positive for subacute deep vein thrombosis involving the popliteal vein and posterior tibial veins. The peroneal veins were not visualized due to patient 's body habitus. Results given patient's nurse, April. Oda Cogan, BS, RDMS, RVT

## 2016-08-24 NOTE — Progress Notes (Signed)
ANTICOAGULATION CONSULT NOTE - Follow Up Consult  Pharmacy Consult for Heparin  Indication: pulmonary embolus, on EKOS protocol  Patient Measurements: Height: 5\' 7"  (170.2 cm) Weight: (!) 351 lb 3.1 oz (159.3 kg) IBW/kg (Calculated) : 66.1  Vital Signs: Temp: 99.1 F (37.3 C) (04/02 0000) Temp Source: Oral (04/02 0000) BP: 141/67 (04/02 0100) Pulse Rate: 95 (04/02 0100)  Labs:  Recent Labs  08/23/16 0303  08/23/16 0543 08/23/16 1124 08/23/16 1638 08/23/16 1654 08/23/16 2238 08/23/16 2254 08/24/16 0220  HGB 12.1*  --   --  10.4*  --  10.3*  --  10.0*  --   HCT 38.4*  --   --  34.5*  --  34.4*  --  33.8*  --   PLT 335  --   --  283  --  271  --  246  --   APTT  --   --  26  --   --   --   --   --   --   LABPROT  --   --  14.5 15.4*  --   --   --   --   --   INR  --   --  1.12 1.22  --   --   --   --   --   HEPARINUNFRC  --   < > <0.10* 0.72*  --  0.74*  --   --  0.53  CREATININE 2.07*  --   --  1.64*  --   --   --   --   --   TROPONINI 0.08*  --   --  0.08* 0.13*  --  0.10*  --   --   < > = values in this interval not displayed.  Estimated Creatinine Clearance: 60.4 mL/min (A) (by C-G formula based on SCr of 1.64 mg/dL (H)).   Assessment: 71 y/o M with PE on EKOS protocol, heparin level therapeutic x 1 after rate decrease  Goal of Therapy:  Heparin level 0.3-0.7 units/ml Monitor platelets by anticoagulation protocol: Yes   Plan:  -Cont heparin 1500 units/hr -1000 HL  Narda Bonds 08/24/2016,3:21 AM

## 2016-08-24 NOTE — Progress Notes (Signed)
[facility] Progress Note Patient Name: [name] DOB: 24-Jan-1946 MRN: [id number]   Date of Service  08/24/2016  HPI/Events of Note  Neuropathy - Patient requests home Neurontin restarted.   eICU Interventions  Will order: 1. Neurontin 400 mg PO TID.     Intervention Category Intermediate Interventions: Other:  Lysle Dingwall 08/24/2016, 6:05 PM

## 2016-08-24 NOTE — Progress Notes (Addendum)
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 Compliance  Admitted 08/23/16 to [facility] ER with resp distress and hypoxemia, lacti acidosis, AKI and found to have Submassive PE. Started on bipap 40% fio2 but on [facility] feels he can come off. STarted on IV heparin gtt and sent to [facility] for consideration of EKOS. Started feeling better on arrival to [facility]  Noted in past hx he has hx of stomach ulcers but he denies bleedng episodes and such a diagnosis  VITAL SIGNS: BP (!) 149/56   Pulse 89   Temp 98.8 F (37.1 C) (Oral)   Resp (!) 23   Ht 5\' 7"  (1.702 m)   Wt (!) 159.7 kg (352 lb 1.2 oz)   SpO2 98%   BMI 55.14 kg/m   HEMODYNAMICS:    VENTILATOR SETTINGS: Vent Mode: BIPAP;PCV FiO2 (%):  [30 %] 30 % Set Rate:  [12 bmp] 12 bmp PEEP:  [5 cmH20] 5 cmH20  INTAKE / OUTPUT: I/O last 3 completed shifts: In: 3443.2 [I.V.:2943.2; IV [id number]:338] Out: 3100 [Urine:3100]  EXAM  General: Obese male, resting comfortably in exam  Heart: RRR, Nl S1/S2, -M/R/G. Lung: Decreased BS diffusely Abdomen: Soft, NT, ND and +BS Ext: Right knee wound from fall Neuro: Alert and interactive, moving all ext to command Skin: Intact  LABS  PULMONARY No results for input(s): PHART, PCO2ART, PO2ART, HCO3, TCO2, O2SAT in the last 168 hours.  Invalid input(s): PCO2, PO2  CBC  Recent Labs Lab 08/23/16 1654 08/23/16 2254 08/24/16 0454  HGB 10.3* 10.0* 9.6*  HCT 34.4* 33.8* 32.9*  WBC 11.0* 10.0 8.7  PLT 271 246 230    COAGULATION  Recent Labs Lab 08/23/16 0543  08/23/16 1124  INR 1.12 1.22    CARDIAC    Recent Labs Lab 08/23/16 0303 08/23/16 1124 08/23/16 1638 08/23/16 2238  TROPONINI 0.08* 0.08* 0.13* 0.10*   No results for input(s): PROBNP in the last 168 hours.   CHEMISTRY  Recent Labs Lab 08/23/16 0303 08/23/16 1124 08/24/16 0454  NA 134* 135 141  K 2.8* 2.9* 3.0*  CL 93* 95* 103  CO2 28 25 28   GLUCOSE 330* 194* 162*  BUN 66* 50* 28*  CREATININE 2.07* 1.64* 1.17  CALCIUM 9.3 8.8* 8.6*  MG  --  2.8* 2.7*  PHOS  --  3.3 3.0   Estimated Creatinine Clearance: 84.8 mL/min (by C-G formula based on SCr of 1.17 mg/dL).  LIVER  Recent Labs Lab 08/23/16 0543 08/23/16 1124  AST  --  49*  ALT  --  24  ALKPHOS  --  101  BILITOT  --  0.8  PROT  --  7.1  ALBUMIN  --  3.3*  INR 1.12 1.22   INFECTIOUS  Recent Labs Lab 08/23/16 0735 08/23/16 1124 08/23/16 1725  LATICACIDVEN 2.4* 2.8* 1.6  PROCALCITON  --  0.10  --    ENDOCRINE CBG (last 3)   Recent Labs  08/23/16 0931  GLUCAP 218*   IMAGING x48h  -  image(s) personally visualized  -   highlighted in bold Ct Angio Chest Pe W And/or Wo Contrast  Result Date: 08/23/2016 CLINICAL DATA:  Shortness of breath. EXAM: CT ANGIOGRAPHY CHEST WITH CONTRAST TECHNIQUE: Multidetector CT imaging of the chest was performed using the standard protocol during bolus administration of intravenous contrast. Multiplanar CT image reconstructions and MIPs were obtained to evaluate the vascular anatomy. CONTRAST:  75 cc Isovue 370 IV COMPARISON:  Chest radiograph earlier this day.  Chest CT 03/02/2015 FINDINGS: Cardiovascular: Examination is positive for bilateral acute pulmonary emboli. Filling defects on the right involves the distal main, upper and lower lobar arteries extending into the subsegmental branches. On the left thrombus within the distal main, upper and lower lobar artery is. There is evidence of right heart strain RV to LV ratio 1.26. Thoracic aorta is normal in caliber,  patient is post CABG. Common origin of the brachiocephalic and left common carotid artery Mediastinum/Nodes: Mediastinal lipomatosis. No adenopathy. Visualized thyroid gland is unremarkable. The esophagus is decompressed. Lungs/Pleura: Breathing motion artifact obscures evaluation particularly at the lung bases. Subsegmental linear atelectasis in the left lower lobe and lingula. 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 Subpleural fat on the left, no pleural effusion. No evidence pulmonary infarct. Upper Abdomen: No acute abnormality.  Right renal cyst. Musculoskeletal: There are no acute or suspicious osseous abnormalities. Degenerative change in the thoracic spine with bridging anterior osteophytes. Review of the MIP images confirms the above findings. IMPRESSION: Positive for bilateral acute PE with moderate thromboembolic burden. There is CT evidence of right heart strain (RV/LV Ratio = 1.26) consistent with at least submassive (intermediate risk) PE. The presence of right heart strain has been associated with an increased risk of morbidity and mortality. Please activate Code PE by paging [phone]. Critical Value/emergent results were called by telephone at the time of interpretation on 08/23/2016 at 5:09 am to Dr. Nance Pear , who verbally acknowledged these results. Electronically Signed   By: Jeb Levering M.D.   On: 08/23/2016 05:10   Dg Chest Port 1 View  Result Date: 08/23/2016 CLINICAL DATA:  Bilateral pulmonary emboli. EXAM: PORTABLE CHEST 1 VIEW COMPARISON:  Chest radiographs and chest CTA obtained earlier today FINDINGS: Stable mildly enlarged cardiac silhouette and post CABG changes. Stable elevation of the right hemidiaphragm. Clear lungs. Thoracic spine degenerative changes. IMPRESSION: No acute abnormality.  Stable cardiomegaly Electronically Signed   By: Claudie Revering M.D.   On: 08/23/2016 10:56   Dg Chest Portable 1  View  Result Date: 08/23/2016 CLINICAL DATA:  Acute onset of respiratory distress. Decreased O2 saturation. Initial encounter. EXAM: PORTABLE CHEST 1 VIEW COMPARISON:  Chest radiograph performed 07/13/2016 FINDINGS: The lungs are well-aerated. There is elevation of the right hemidiaphragm. Mild bibasilar atelectasis or scarring is noted. There is no evidence of pleural effusion or pneumothorax. The cardiomediastinal silhouette is mildly enlarged. The patient is status post median sternotomy. No acute osseous abnormalities are seen. IMPRESSION: Elevation of the right hemidiaphragm. Mild bibasilar atelectasis or scarring. Mild cardiomegaly. Electronically Signed   By: Garald Balding M.D.   On: 08/23/2016 03:15   I reviewed chest CT myself, PE noted.  ASSESSMENT and PLAN  PE:  - Heparin  - EKOS complete  - ?when to take out catheters  Hypoxemia:  - Titrate O2 for sat of 88-92%  DM:  - ISS  - CBGs  Hemorrhage from line site:  - D/C lytics  - Remove catheter  per IR.  Discussed with PCCM-NP.  Transfer to SDU and to [facility] service with PCCM off 4/3  Rush Farmer, M.D. [facility] Pulmonary/Critical Care Medicine. Pager: [phone]. After hours pager: [phone].  08/24/2016 10:27 AM

## 2016-08-25 ENCOUNTER — Encounter (HOSPITAL_COMMUNITY): Payer: Self-pay | Admitting: Interventional Radiology

## 2016-08-25 DIAGNOSIS — N183 Chronic kidney disease, stage 3 (moderate): Secondary | ICD-10-CM

## 2016-08-25 DIAGNOSIS — I5032 Chronic diastolic (congestive) heart failure: Secondary | ICD-10-CM

## 2016-08-25 DIAGNOSIS — J9621 Acute and chronic respiratory failure with hypoxia: Secondary | ICD-10-CM

## 2016-08-25 DIAGNOSIS — N179 Acute kidney failure, unspecified: Secondary | ICD-10-CM

## 2016-08-25 DIAGNOSIS — I2602 Saddle embolus of pulmonary artery with acute cor pulmonale: Secondary | ICD-10-CM

## 2016-08-25 LAB — BASIC METABOLIC PANEL
ANION GAP: 10 (ref 5–15)
BUN: 19 mg/dL (ref 6–20)
CHLORIDE: 100 mmol/L — AB (ref 101–111)
CO2: 27 mmol/L (ref 22–32)
Calcium: 9.1 mg/dL (ref 8.9–10.3)
Creatinine, Ser: 1.1 mg/dL (ref 0.61–1.24)
GFR calc Af Amer: >60 mL/min (ref >60)
GFR calc non Af Amer: >60 mL/min (ref >60)
GLUCOSE: 206 mg/dL — AB (ref 65–99)
POTASSIUM: 3.4 mmol/L — AB (ref 3.5–5.1)
Sodium: 137 mmol/L (ref 135–145)

## 2016-08-25 LAB — GLUCOSE, CAPILLARY
GLUCOSE-CAPILLARY: 169 mg/dL — AB (ref 65–99)
GLUCOSE-CAPILLARY: 189 mg/dL — AB (ref 65–99)
Glucose-Capillary: 155 mg/dL — ABNORMAL HIGH (ref 65–99)
Glucose-Capillary: 174 mg/dL — ABNORMAL HIGH (ref 65–99)

## 2016-08-25 LAB — CBC
HEMATOCRIT: 32.5 % — AB (ref 39.0–52.0)
Hemoglobin: 9.5 g/dL — ABNORMAL LOW (ref 13.0–17.0)
MCH: 25.2 pg — ABNORMAL LOW (ref 26.0–34.0)
MCHC: 29.2 g/dL — ABNORMAL LOW (ref 30.0–36.0)
MCV: 86.2 fL (ref 78.0–100.0)
Platelets: 252 10*3/uL (ref 150–400)
RBC: 3.77 MIL/uL — AB (ref 4.22–5.81)
RDW: 17.1 % — ABNORMAL HIGH (ref 11.5–15.5)
WBC: 9.2 10*3/uL (ref 4.0–10.5)

## 2016-08-25 LAB — HEPARIN LEVEL (UNFRACTIONATED): Heparin Unfractionated: 0.45 IU/mL (ref 0.30–0.70)

## 2016-08-25 LAB — HEMOGLOBIN A1C
HEMOGLOBIN A1C: 6.8 % — AB (ref 4.8–5.6)
MEAN PLASMA GLUCOSE: 148 mg/dL

## 2016-08-25 MED ORDER — DOCUSATE SODIUM 100 MG PO CAPS
100.0000 mg | ORAL_CAPSULE | Freq: Three times a day (TID) | ORAL | Status: DC
  Administered 2016-08-25 – 2016-08-27 (×6): 100 mg via ORAL
  Filled 2016-08-25 (×7): qty 1

## 2016-08-25 MED ORDER — RIVAROXABAN 20 MG PO TABS: 20.0000 mg | ORAL_TABLET | Freq: Every day | ORAL | Status: DC

## 2016-08-25 MED ORDER — ZIPRASIDONE HCL 80 MG PO CAPS
80.0000 mg | ORAL_CAPSULE | Freq: Two times a day (BID) | ORAL | Status: DC
  Administered 2016-08-25 – 2016-08-26 (×2): 80 mg via ORAL
  Filled 2016-08-25 (×2): qty 1

## 2016-08-25 MED ORDER — POTASSIUM CHLORIDE CRYS ER 20 MEQ PO TBCR
40.0000 meq | EXTENDED_RELEASE_TABLET | Freq: Once | ORAL | Status: AC
  Administered 2016-08-25: 40 meq via ORAL
  Filled 2016-08-25: qty 2

## 2016-08-25 MED ORDER — INSULIN ASPART 100 UNIT/ML ~~LOC~~ SOLN
0.0000 [IU] | Freq: Three times a day (TID) | SUBCUTANEOUS | Status: DC
  Administered 2016-08-25: 4 [IU] via SUBCUTANEOUS
  Administered 2016-08-26: 3 [IU] via SUBCUTANEOUS
  Administered 2016-08-26 (×2): 4 [IU] via SUBCUTANEOUS
  Administered 2016-08-27: 7 [IU] via SUBCUTANEOUS
  Administered 2016-08-27: 4 [IU] via SUBCUTANEOUS
  Administered 2016-08-27: 3 [IU] via SUBCUTANEOUS

## 2016-08-25 MED ORDER — SENNA 8.6 MG PO TABS
1.0000 | ORAL_TABLET | Freq: Every day | ORAL | Status: DC
  Filled 2016-08-25: qty 1

## 2016-08-25 MED ORDER — RIVAROXABAN 15 MG PO TABS
15.0000 mg | ORAL_TABLET | Freq: Two times a day (BID) | ORAL | Status: DC
  Administered 2016-08-26 – 2016-08-27 (×4): 15 mg via ORAL
  Filled 2016-08-25 (×4): qty 1

## 2016-08-25 MED ORDER — SENNA 8.6 MG PO TABS
2.0000 | ORAL_TABLET | Freq: Every day | ORAL | Status: DC
Start: 2016-08-25 — End: 2016-08-27
  Administered 2016-08-25 – 2016-08-27 (×3): 17.2 mg via ORAL
  Filled 2016-08-25 (×3): qty 2

## 2016-08-25 NOTE — Progress Notes (Signed)
Referring Physician(s): Dr. Brand Males  Supervising Physician: Markus Daft  Patient Status:  [name] - In-pt  Chief Complaint:  Pulmonary embolism S/P catheter directed lysis by Dr. Vernard Gambles 08/23/2016  Subjective:  Patient states he is feeling better today. No new complaints Still on heparin drip  Allergies: Cephalosporins; Dilaudid [hydromorphone hcl]; Hydrocodone-acetaminophen; Morphine and related; Tetracycline; Tetracyclines & related; Penicillins; and Sulfa antibiotics  Medications: Prior to Admission medications   Medication Sig Start Date End Date Taking? Authorizing Provider  albuterol (PROAIR HFA) 108 (90 Base) MCG/ACT inhaler Inhale 2 puffs into the lungs every 4 (four) hours as needed for wheezing or shortness of breath.   Yes Historical Provider, MD  amLODipine (NORVASC) 10 MG tablet Take 10 mg by mouth daily.  05/31/13  Yes Historical Provider, MD  aspirin EC 81 MG tablet Take 81 mg by mouth daily.   Yes Historical Provider, MD  atorvastatin (LIPITOR) 80 MG tablet Take 80 mg by mouth every evening.   Yes Historical Provider, MD  buPROPion (WELLBUTRIN SR) 100 MG 12 hr tablet Take 100 mg by mouth daily.  05/31/13  Yes Historical Provider, MD  celecoxib (CELEBREX) 200 MG capsule Take 200 mg by mouth 2 (two) times daily.  05/21/16  Yes Historical Provider, MD  cetirizine (ZYRTEC) 10 MG tablet Take 10 mg by mouth daily.   Yes Historical Provider, MD  Cholecalciferol (VITAMIN D3) 50000 units CAPS Take 50,000 Units by mouth every Thursday.    Yes Historical Provider, MD  clopidogrel (PLAVIX) 75 MG tablet Take 75 mg by mouth daily.  05/31/13  Yes Historical Provider, MD  collagenase (SANTYL) ointment Apply 1 application topically See admin instructions. Apply to wound bed (nickel thick) daily for wound care   Yes Historical Provider, MD  docusate sodium (COLACE) 100 MG capsule Take 100 mg by mouth 3 (three) times daily.   Yes Historical Provider, MD  fluticasone (FLONASE) 50  MCG/ACT nasal spray Place 1 spray into both nostrils daily.   Yes Historical Provider, MD  Fluticasone-Salmeterol (ADVAIR) 100-50 MCG/DOSE AEPB Inhale 1 puff into the lungs 2 (two) times daily.   Yes Historical Provider, MD  furosemide (LASIX) 40 MG tablet Take 40 mg by mouth 2 (two) times daily.   Yes Historical Provider, MD  gabapentin (NEURONTIN) 400 MG capsule Take 400 mg by mouth 3 (three) times daily.  05/31/13  Yes Historical Provider, MD  hydrALAZINE (APRESOLINE) 25 MG tablet Take 25 mg by mouth 3 (three) times daily.    Yes Historical Provider, MD  hydrochlorothiazide (MICROZIDE) 12.5 MG capsule Take 12.5 mg by mouth daily.   Yes Historical Provider, MD  ipratropium-albuterol (DUONEB) 0.5-2.5 (3) MG/3ML SOLN Inhale 3 mLs into the lungs every 4 (four) hours as needed (shortness of breath/wheezing).  05/01/16  Yes Historical Provider, MD  iron polysaccharides (NIFEREX) 150 MG capsule Take 150 mg by mouth daily.   Yes Historical Provider, MD  isosorbide mononitrate (IMDUR) 60 MG 24 hr tablet Take 60 mg by mouth daily.  05/31/13  Yes Historical Provider, MD  levothyroxine (SYNTHROID, LEVOTHROID) 100 MCG tablet Take 100 mcg by mouth daily before breakfast.  05/31/13  Yes Historical Provider, MD  magnesium oxide (MAG-OX) 400 MG tablet Take 400 mg by mouth daily.   Yes Historical Provider, MD  meclizine (ANTIVERT) 25 MG tablet Take 25 mg by mouth 2 (two) times daily as needed for dizziness.  05/31/13  Yes Historical Provider, MD  metFORMIN (GLUCOPHAGE) 500 MG tablet Take 500 mg by mouth 2 (  two) times daily with a meal.   Yes Historical Provider, MD  metolazone (ZAROXOLYN) 5 MG tablet Take 5 mg by mouth daily.   Yes Historical Provider, MD  metoprolol (LOPRESSOR) 50 MG tablet Take 50 mg by mouth 2 (two) times daily.  05/31/13  Yes Historical Provider, MD  montelukast (SINGULAIR) 10 MG tablet Take 1 tablet by mouth at bedtime.    Yes Historical Provider, MD  nystatin (NYSTATIN) powder Apply topically See admin  instructions. Apply to abdominal folds and groin topically twice daily   Yes Historical Provider, MD  oxyCODONE (ROXICODONE) 5 MG immediate release tablet Take 1 tablet (5 mg total) by mouth every 4 (four) hours as needed for severe pain. 07/21/16  Yes Merlyn Lot, MD  pantoprazole (PROTONIX) 40 MG tablet Take 40 mg by mouth daily.    Yes Historical Provider, MD  senna-docusate (SENOKOT-S) 8.6-50 MG tablet Take 2 tablets by mouth daily.   Yes Historical Provider, MD  Suvorexant (BELSOMRA) 10 MG TABS Take 10 mg by mouth at bedtime.   Yes Historical Provider, MD  torsemide (DEMADEX) 10 MG tablet Take 10 mg by mouth daily.   Yes Historical Provider, MD  ziprasidone (GEODON) 80 MG capsule Take 80 mg by mouth 2 (two) times daily with a meal.   Yes Historical Provider, MD  ibuprofen (ADVIL,MOTRIN) 600 MG tablet Take 1 tablet (600 mg total) by mouth every 8 (eight) hours as needed. Patient not taking: Reported on 08/23/2016 07/20/16   Darel Hong, MD  traMADol (ULTRAM) 50 MG tablet Take 2 tablets (100 mg total) by mouth every 6 (six) hours as needed for severe pain. Patient not taking: Reported on 08/23/2016 07/21/16   Merlyn Lot, MD     Vital Signs: BP (!) 134/59   Pulse 99   Temp 98.9 F (37.2 C) (Oral)   Resp (!) 36   Ht 5\' 7"  (1.702 m)   Wt 160 lb (72.6 kg)   SpO2 96%   BMI 25.06 kg/m   Physical Exam Morbidly obese NAD Right neck access site looks good, no hematoma.  Imaging: Ct Angio Chest Pe W And/or Wo Contrast  Result Date: 08/23/2016 CLINICAL DATA:  Shortness of breath. EXAM: CT ANGIOGRAPHY CHEST WITH CONTRAST TECHNIQUE: Multidetector CT imaging of the chest was performed using the standard protocol during bolus administration of intravenous contrast. Multiplanar CT image reconstructions and MIPs were obtained to evaluate the vascular anatomy. CONTRAST:  75 cc Isovue 370 IV COMPARISON:  Chest radiograph earlier this day.  Chest CT 03/02/2015 FINDINGS: Cardiovascular:  Examination is positive for bilateral acute pulmonary emboli. Filling defects on the right involves the distal main, upper and lower lobar arteries extending into the subsegmental branches. On the left thrombus within the distal main, upper and lower lobar artery is. There is evidence of right heart strain RV to LV ratio 1.26. Thoracic aorta is normal in caliber, patient is post CABG. Common origin of the brachiocephalic and left common carotid artery Mediastinum/Nodes: Mediastinal lipomatosis. No adenopathy. Visualized thyroid gland is unremarkable. The esophagus is decompressed. Lungs/Pleura: Breathing motion artifact obscures evaluation particularly at the lung bases. Subsegmental linear atelectasis in the left lower lobe and lingula. 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 Subpleural fat on the left, no pleural effusion. No evidence pulmonary infarct. Upper Abdomen: No acute abnormality.  Right renal cyst. Musculoskeletal: There are no acute or suspicious osseous abnormalities. Degenerative change in the thoracic spine with bridging anterior osteophytes.  Review of the MIP images confirms the above findings. IMPRESSION: Positive for bilateral acute PE with moderate thromboembolic burden. There is CT evidence of right heart strain (RV/LV Ratio = 1.26) consistent with at least submassive (intermediate risk) PE. The presence of right heart strain has been associated with an increased risk of morbidity and mortality. Please activate Code PE by paging [phone]. Critical Value/emergent results were called by telephone at the time of interpretation on 08/23/2016 at 5:09 am to Dr. Nance Pear , who verbally acknowledged these results. Electronically Signed   By: Jeb Levering M.D.   On: 08/23/2016 05:10   Ir Angiogram Pulmonary Bilateral Selective  Result Date: 08/25/2016 INDICATION: Sub massive bilateral pulmonary emboli with shortness of breath  and right heart strain. EXAM: 1. ULTRASOUND GUIDANCE FOR VENOUS ACCESS X2 2. BILATERAL PULMONARY ARTERIOGRAPHY 3. FLUOROSCOPIC GUIDED PLACEMENT OF BILATERAL PULMONARY ARTERIAL LYTIC INFUSION CATHETERS COMPARISON:  Chest CTA - 08/23/2016 MEDICATIONS: Intravenous Fentanyl and Versed were administered as conscious sedation during continuous monitoring of the patient's level of consciousness and physiological / cardiorespiratory status by the radiology RN, with a total moderate sedation time of 33 minutes. CONTRAST:  Isovue 5 mL FLUOROSCOPY TIME:  4 minutes 12 second (218  uGym2 DAP) COMPLICATIONS: None immediate TECHNIQUE: Informed written consent was obtained from the patient after a discussion of the risks, benefits and alternatives to treatment. Questions regarding the procedure were encouraged and answered. A timeout was performed prior to the initiation of the procedure. Ultrasound scanning was performed of the right IJ vein demonstrating adequate size and patency. As such, the right internal jugular vein was selected for vascular access. The right neck was prepped and draped in the usual sterile fashion, and a sterile drape was applied covering the operative field. Maximum barrier sterile technique with sterile gowns and gloves were used for the procedure. A timeout was performed prior to the initiation of the procedure. Local anesthesia was provided with 1% lidocaine. Under direct ultrasound guidance, the right internal jugular vein was accessed with a micro puncture sheath ultimately allowing placement of a 6 French vascular sheath. With the use of a glidewire, a 5 French angled pigtail catheter was advanced into the right main pulmonary artery. Pressure measurements were then obtained from the right pulmonary artery. Limited right pulmonary arteriogram was performed with a hand injection. Over an exchange length Rosen wire, the pigtail catheter was exchanged for a 105/18 cm multi side-hole EKOS ultrasound  assisted infusion catheter. Slightly caudal to the initial access, the right internal jugular vein was again accessed with a micropuncture sheath ultimately allowing placement of a 6French vascular sheath. With the use of a glidewire, a pigtail catheter was advanced into the left main pulmonary artery and a limited left pulmonary arteriogram was performed with a hand injection. Over an exchange length Rosen wire, the pigtail catheter was exchanged for a 105/12 cm multi side-hole EKOS ultrasound assisted infusion catheter. A postprocedural fluoroscopic image was obtained of the check demonstrating final catheter positioning. Both vascular sheath were secured at the right neck with 0 Prolene sutures. The external catheter tubing was secured at the right chest and the lytic therapy was initiated. The patient tolerated the procedure well without immediate postprocedural complication. FINDINGS: Acquired pressure measurements: Right main pulmonary artery:  51/31(40)mmHg   (normal: < 25/10) Limited pulmonary arteriograms demonstrates moderate incompletely occlusive pulmonary embolism within the distal aspect of the right and left pulmonary arteries and segmental branches, similar to that seen on previous CTA. Both the right and  left pulmonary arteries are noted to be markedly enlarged. Following the procedure, both ultrasound assisted infusion catheter tips terminate within the distal aspects of the bilateral lower lobe sub segmental pulmonary arteries. IMPRESSION: 1. Successful fluoroscopic guided initiation of bilateral ultrasound assisted catheter directed pulmonary arterial lysis for sub massive pulmonary embolism and right-sided heart strain. 2. Markedly elevated pressure measurements within the right main pulmonary artery compatible with critical pulmonary arterial hypertension. PLAN: - repeat pressure measurements 12-24 hrs post lysis, and either removal of the catheters or continuation of the catheter directed  thrombolysis. Electronically Signed   By: Lucrezia Europe M.D.   On: 08/25/2016 08:30   Ir Angiogram Selective Each Additional Vessel  Result Date: 08/25/2016 INDICATION: Sub massive bilateral pulmonary emboli with shortness of breath and right heart strain. EXAM: 1. ULTRASOUND GUIDANCE FOR VENOUS ACCESS X2 2. BILATERAL PULMONARY ARTERIOGRAPHY 3. FLUOROSCOPIC GUIDED PLACEMENT OF BILATERAL PULMONARY ARTERIAL LYTIC INFUSION CATHETERS COMPARISON:  Chest CTA - 08/23/2016 MEDICATIONS: Intravenous Fentanyl and Versed were administered as conscious sedation during continuous monitoring of the patient's level of consciousness and physiological / cardiorespiratory status by the radiology RN, with a total moderate sedation time of 33 minutes. CONTRAST:  Isovue 5 mL FLUOROSCOPY TIME:  4 minutes 12 second (218  uGym2 DAP) COMPLICATIONS: None immediate TECHNIQUE: Informed written consent was obtained from the patient after a discussion of the risks, benefits and alternatives to treatment. Questions regarding the procedure were encouraged and answered. A timeout was performed prior to the initiation of the procedure. Ultrasound scanning was performed of the right IJ vein demonstrating adequate size and patency. As such, the right internal jugular vein was selected for vascular access. The right neck was prepped and draped in the usual sterile fashion, and a sterile drape was applied covering the operative field. Maximum barrier sterile technique with sterile gowns and gloves were used for the procedure. A timeout was performed prior to the initiation of the procedure. Local anesthesia was provided with 1% lidocaine. Under direct ultrasound guidance, the right internal jugular vein was accessed with a micro puncture sheath ultimately allowing placement of a 6 French vascular sheath. With the use of a glidewire, a 5 French angled pigtail catheter was advanced into the right main pulmonary artery. Pressure measurements were then  obtained from the right pulmonary artery. Limited right pulmonary arteriogram was performed with a hand injection. Over an exchange length Rosen wire, the pigtail catheter was exchanged for a 105/18 cm multi side-hole EKOS ultrasound assisted infusion catheter. Slightly caudal to the initial access, the right internal jugular vein was again accessed with a micropuncture sheath ultimately allowing placement of a 6French vascular sheath. With the use of a glidewire, a pigtail catheter was advanced into the left main pulmonary artery and a limited left pulmonary arteriogram was performed with a hand injection. Over an exchange length Rosen wire, the pigtail catheter was exchanged for a 105/12 cm multi side-hole EKOS ultrasound assisted infusion catheter. A postprocedural fluoroscopic image was obtained of the check demonstrating final catheter positioning. Both vascular sheath were secured at the right neck with 0 Prolene sutures. The external catheter tubing was secured at the right chest and the lytic therapy was initiated. The patient tolerated the procedure well without immediate postprocedural complication. FINDINGS: Acquired pressure measurements: Right main pulmonary artery:  51/31(40)mmHg   (normal: < 25/10) Limited pulmonary arteriograms demonstrates moderate incompletely occlusive pulmonary embolism within the distal aspect of the right and left pulmonary arteries and segmental branches, similar to that seen on  previous CTA. Both the right and left pulmonary arteries are noted to be markedly enlarged. Following the procedure, both ultrasound assisted infusion catheter tips terminate within the distal aspects of the bilateral lower lobe sub segmental pulmonary arteries. IMPRESSION: 1. Successful fluoroscopic guided initiation of bilateral ultrasound assisted catheter directed pulmonary arterial lysis for sub massive pulmonary embolism and right-sided heart strain. 2. Markedly elevated pressure measurements  within the right main pulmonary artery compatible with critical pulmonary arterial hypertension. PLAN: - repeat pressure measurements 12-24 hrs post lysis, and either removal of the catheters or continuation of the catheter directed thrombolysis. Electronically Signed   By: Lucrezia Europe M.D.   On: 08/25/2016 08:30   Ir Angiogram Selective Each Additional Vessel  Result Date: 08/25/2016 INDICATION: Sub massive bilateral pulmonary emboli with shortness of breath and right heart strain. EXAM: 1. ULTRASOUND GUIDANCE FOR VENOUS ACCESS X2 2. BILATERAL PULMONARY ARTERIOGRAPHY 3. FLUOROSCOPIC GUIDED PLACEMENT OF BILATERAL PULMONARY ARTERIAL LYTIC INFUSION CATHETERS COMPARISON:  Chest CTA - 08/23/2016 MEDICATIONS: Intravenous Fentanyl and Versed were administered as conscious sedation during continuous monitoring of the patient's level of consciousness and physiological / cardiorespiratory status by the radiology RN, with a total moderate sedation time of 33 minutes. CONTRAST:  Isovue 5 mL FLUOROSCOPY TIME:  4 minutes 12 second (218  uGym2 DAP) COMPLICATIONS: None immediate TECHNIQUE: Informed written consent was obtained from the patient after a discussion of the risks, benefits and alternatives to treatment. Questions regarding the procedure were encouraged and answered. A timeout was performed prior to the initiation of the procedure. Ultrasound scanning was performed of the right IJ vein demonstrating adequate size and patency. As such, the right internal jugular vein was selected for vascular access. The right neck was prepped and draped in the usual sterile fashion, and a sterile drape was applied covering the operative field. Maximum barrier sterile technique with sterile gowns and gloves were used for the procedure. A timeout was performed prior to the initiation of the procedure. Local anesthesia was provided with 1% lidocaine. Under direct ultrasound guidance, the right internal jugular vein was accessed with a  micro puncture sheath ultimately allowing placement of a 6 French vascular sheath. With the use of a glidewire, a 5 French angled pigtail catheter was advanced into the right main pulmonary artery. Pressure measurements were then obtained from the right pulmonary artery. Limited right pulmonary arteriogram was performed with a hand injection. Over an exchange length Rosen wire, the pigtail catheter was exchanged for a 105/18 cm multi side-hole EKOS ultrasound assisted infusion catheter. Slightly caudal to the initial access, the right internal jugular vein was again accessed with a micropuncture sheath ultimately allowing placement of a 6French vascular sheath. With the use of a glidewire, a pigtail catheter was advanced into the left main pulmonary artery and a limited left pulmonary arteriogram was performed with a hand injection. Over an exchange length Rosen wire, the pigtail catheter was exchanged for a 105/12 cm multi side-hole EKOS ultrasound assisted infusion catheter. A postprocedural fluoroscopic image was obtained of the check demonstrating final catheter positioning. Both vascular sheath were secured at the right neck with 0 Prolene sutures. The external catheter tubing was secured at the right chest and the lytic therapy was initiated. The patient tolerated the procedure well without immediate postprocedural complication. FINDINGS: Acquired pressure measurements: Right main pulmonary artery:  51/31(40)mmHg   (normal: < 25/10) Limited pulmonary arteriograms demonstrates moderate incompletely occlusive pulmonary embolism within the distal aspect of the right and left pulmonary arteries and segmental  branches, similar to that seen on previous CTA. Both the right and left pulmonary arteries are noted to be markedly enlarged. Following the procedure, both ultrasound assisted infusion catheter tips terminate within the distal aspects of the bilateral lower lobe sub segmental pulmonary arteries. IMPRESSION: 1.  Successful fluoroscopic guided initiation of bilateral ultrasound assisted catheter directed pulmonary arterial lysis for sub massive pulmonary embolism and right-sided heart strain. 2. Markedly elevated pressure measurements within the right main pulmonary artery compatible with critical pulmonary arterial hypertension. PLAN: - repeat pressure measurements 12-24 hrs post lysis, and either removal of the catheters or continuation of the catheter directed thrombolysis. Electronically Signed   By: Lucrezia Europe M.D.   On: 08/25/2016 08:30   Ir US Guide Vasc Access Right  Result Date: 08/25/2016 INDICATION: Sub massive bilateral pulmonary emboli with shortness of breath and right heart strain. EXAM: 1. ULTRASOUND GUIDANCE FOR VENOUS ACCESS X2 2. BILATERAL PULMONARY ARTERIOGRAPHY 3. FLUOROSCOPIC GUIDED PLACEMENT OF BILATERAL PULMONARY ARTERIAL LYTIC INFUSION CATHETERS COMPARISON:  Chest CTA - 08/23/2016 MEDICATIONS: Intravenous Fentanyl and Versed were administered as conscious sedation during continuous monitoring of the patient's level of consciousness and physiological / cardiorespiratory status by the radiology RN, with a total moderate sedation time of 33 minutes. CONTRAST:  Isovue 5 mL FLUOROSCOPY TIME:  4 minutes 12 second (218  uGym2 DAP) COMPLICATIONS: None immediate TECHNIQUE: Informed written consent was obtained from the patient after a discussion of the risks, benefits and alternatives to treatment. Questions regarding the procedure were encouraged and answered. A timeout was performed prior to the initiation of the procedure. Ultrasound scanning was performed of the right IJ vein demonstrating adequate size and patency. As such, the right internal jugular vein was selected for vascular access. The right neck was prepped and draped in the usual sterile fashion, and a sterile drape was applied covering the operative field. Maximum barrier sterile technique with sterile gowns and gloves were used for the  procedure. A timeout was performed prior to the initiation of the procedure. Local anesthesia was provided with 1% lidocaine. Under direct ultrasound guidance, the right internal jugular vein was accessed with a micro puncture sheath ultimately allowing placement of a 6 French vascular sheath. With the use of a glidewire, a 5 French angled pigtail catheter was advanced into the right main pulmonary artery. Pressure measurements were then obtained from the right pulmonary artery. Limited right pulmonary arteriogram was performed with a hand injection. Over an exchange length Rosen wire, the pigtail catheter was exchanged for a 105/18 cm multi side-hole EKOS ultrasound assisted infusion catheter. Slightly caudal to the initial access, the right internal jugular vein was again accessed with a micropuncture sheath ultimately allowing placement of a 6French vascular sheath. With the use of a glidewire, a pigtail catheter was advanced into the left main pulmonary artery and a limited left pulmonary arteriogram was performed with a hand injection. Over an exchange length Rosen wire, the pigtail catheter was exchanged for a 105/12 cm multi side-hole EKOS ultrasound assisted infusion catheter. A postprocedural fluoroscopic image was obtained of the check demonstrating final catheter positioning. Both vascular sheath were secured at the right neck with 0 Prolene sutures. The external catheter tubing was secured at the right chest and the lytic therapy was initiated. The patient tolerated the procedure well without immediate postprocedural complication. FINDINGS: Acquired pressure measurements: Right main pulmonary artery:  51/31(40)mmHg   (normal: < 25/10) Limited pulmonary arteriograms demonstrates moderate incompletely occlusive pulmonary embolism within the distal aspect of the right  and left pulmonary arteries and segmental branches, similar to that seen on previous CTA. Both the right and left pulmonary arteries are  noted to be markedly enlarged. Following the procedure, both ultrasound assisted infusion catheter tips terminate within the distal aspects of the bilateral lower lobe sub segmental pulmonary arteries. IMPRESSION: 1. Successful fluoroscopic guided initiation of bilateral ultrasound assisted catheter directed pulmonary arterial lysis for sub massive pulmonary embolism and right-sided heart strain. 2. Markedly elevated pressure measurements within the right main pulmonary artery compatible with critical pulmonary arterial hypertension. PLAN: - repeat pressure measurements 12-24 hrs post lysis, and either removal of the catheters or continuation of the catheter directed thrombolysis. Electronically Signed   By: Lucrezia Europe M.D.   On: 08/25/2016 08:30   Dg Chest Port 1 View  Result Date: 08/24/2016 CLINICAL DATA:  71 year old male with pulmonary embolus. Lysis. Subsequent encounter. EXAM: PORTABLE CHEST 1 VIEW COMPARISON:  None. FINDINGS: Pulmonary artery catheters in place with tips projecting at the level of the right lower lobe pulmonary artery and left lower lobe pulmonary artery. No pneumothorax. Pulmonary vascular congestion. Elevated right hemidiaphragm with right base subsegmental atelectasis. Cardiomegaly. Fractured sternal wires. IMPRESSION: Pulmonary artery catheters in place at the level of the right lower lobe pulmonary artery and left lower lobe pulmonary artery. Pulmonary vascular congestion. Elevated right hemidiaphragm with right base subsegmental atelectasis. Cardiomegaly. Electronically Signed   By: Genia Del M.D.   On: 08/24/2016 10:41   Dg Chest Port 1 View  Result Date: 08/23/2016 CLINICAL DATA:  Bilateral pulmonary emboli. EXAM: PORTABLE CHEST 1 VIEW COMPARISON:  Chest radiographs and chest CTA obtained earlier today FINDINGS: Stable mildly enlarged cardiac silhouette and post CABG changes. Stable elevation of the right hemidiaphragm. Clear lungs. Thoracic spine degenerative changes.  IMPRESSION: No acute abnormality.  Stable cardiomegaly Electronically Signed   By: Claudie Revering M.D.   On: 08/23/2016 10:56   Dg Chest Portable 1 View  Result Date: 08/23/2016 CLINICAL DATA:  Acute onset of respiratory distress. Decreased O2 saturation. Initial encounter. EXAM: PORTABLE CHEST 1 VIEW COMPARISON:  Chest radiograph performed 07/13/2016 FINDINGS: The lungs are well-aerated. There is elevation of the right hemidiaphragm. Mild bibasilar atelectasis or scarring is noted. There is no evidence of pleural effusion or pneumothorax. The cardiomediastinal silhouette is mildly enlarged. The patient is status post median sternotomy. No acute osseous abnormalities are seen. IMPRESSION: Elevation of the right hemidiaphragm. Mild bibasilar atelectasis or scarring. Mild cardiomegaly. Electronically Signed   By: Garald Balding M.D.   On: 08/23/2016 03:15   Ir Infusion Thrombol Arterial Initial (ms)  Result Date: 08/25/2016 INDICATION: Sub massive bilateral pulmonary emboli with shortness of breath and right heart strain. EXAM: 1. ULTRASOUND GUIDANCE FOR VENOUS ACCESS X2 2. BILATERAL PULMONARY ARTERIOGRAPHY 3. FLUOROSCOPIC GUIDED PLACEMENT OF BILATERAL PULMONARY ARTERIAL LYTIC INFUSION CATHETERS COMPARISON:  Chest CTA - 08/23/2016 MEDICATIONS: Intravenous Fentanyl and Versed were administered as conscious sedation during continuous monitoring of the patient's level of consciousness and physiological / cardiorespiratory status by the radiology RN, with a total moderate sedation time of 33 minutes. CONTRAST:  Isovue 5 mL FLUOROSCOPY TIME:  4 minutes 12 second (218  uGym2 DAP) COMPLICATIONS: None immediate TECHNIQUE: Informed written consent was obtained from the patient after a discussion of the risks, benefits and alternatives to treatment. Questions regarding the procedure were encouraged and answered. A timeout was performed prior to the initiation of the procedure. Ultrasound scanning was performed of the right  IJ vein demonstrating adequate size and patency. As such,  the right internal jugular vein was selected for vascular access. The right neck was prepped and draped in the usual sterile fashion, and a sterile drape was applied covering the operative field. Maximum barrier sterile technique with sterile gowns and gloves were used for the procedure. A timeout was performed prior to the initiation of the procedure. Local anesthesia was provided with 1% lidocaine. Under direct ultrasound guidance, the right internal jugular vein was accessed with a micro puncture sheath ultimately allowing placement of a 6 French vascular sheath. With the use of a glidewire, a 5 French angled pigtail catheter was advanced into the right main pulmonary artery. Pressure measurements were then obtained from the right pulmonary artery. Limited right pulmonary arteriogram was performed with a hand injection. Over an exchange length Rosen wire, the pigtail catheter was exchanged for a 105/18 cm multi side-hole EKOS ultrasound assisted infusion catheter. Slightly caudal to the initial access, the right internal jugular vein was again accessed with a micropuncture sheath ultimately allowing placement of a 6French vascular sheath. With the use of a glidewire, a pigtail catheter was advanced into the left main pulmonary artery and a limited left pulmonary arteriogram was performed with a hand injection. Over an exchange length Rosen wire, the pigtail catheter was exchanged for a 105/12 cm multi side-hole EKOS ultrasound assisted infusion catheter. A postprocedural fluoroscopic image was obtained of the check demonstrating final catheter positioning. Both vascular sheath were secured at the right neck with 0 Prolene sutures. The external catheter tubing was secured at the right chest and the lytic therapy was initiated. The patient tolerated the procedure well without immediate postprocedural complication. FINDINGS: Acquired pressure measurements:  Right main pulmonary artery:  51/31(40)mmHg   (normal: < 25/10) Limited pulmonary arteriograms demonstrates moderate incompletely occlusive pulmonary embolism within the distal aspect of the right and left pulmonary arteries and segmental branches, similar to that seen on previous CTA. Both the right and left pulmonary arteries are noted to be markedly enlarged. Following the procedure, both ultrasound assisted infusion catheter tips terminate within the distal aspects of the bilateral lower lobe sub segmental pulmonary arteries. IMPRESSION: 1. Successful fluoroscopic guided initiation of bilateral ultrasound assisted catheter directed pulmonary arterial lysis for sub massive pulmonary embolism and right-sided heart strain. 2. Markedly elevated pressure measurements within the right main pulmonary artery compatible with critical pulmonary arterial hypertension. PLAN: - repeat pressure measurements 12-24 hrs post lysis, and either removal of the catheters or continuation of the catheter directed thrombolysis. Electronically Signed   By: Lucrezia Europe M.D.   On: 08/25/2016 08:30   Ir Infusion Thrombol Arterial Initial (ms)  Result Date: 08/25/2016 INDICATION: Sub massive bilateral pulmonary emboli with shortness of breath and right heart strain. EXAM: 1. ULTRASOUND GUIDANCE FOR VENOUS ACCESS X2 2. BILATERAL PULMONARY ARTERIOGRAPHY 3. FLUOROSCOPIC GUIDED PLACEMENT OF BILATERAL PULMONARY ARTERIAL LYTIC INFUSION CATHETERS COMPARISON:  Chest CTA - 08/23/2016 MEDICATIONS: Intravenous Fentanyl and Versed were administered as conscious sedation during continuous monitoring of the patient's level of consciousness and physiological / cardiorespiratory status by the radiology RN, with a total moderate sedation time of 33 minutes. CONTRAST:  Isovue 5 mL FLUOROSCOPY TIME:  4 minutes 12 second (218  uGym2 DAP) COMPLICATIONS: None immediate TECHNIQUE: Informed written consent was obtained from the patient after a discussion of the  risks, benefits and alternatives to treatment. Questions regarding the procedure were encouraged and answered. A timeout was performed prior to the initiation of the procedure. Ultrasound scanning was performed of the right IJ vein demonstrating  adequate size and patency. As such, the right internal jugular vein was selected for vascular access. The right neck was prepped and draped in the usual sterile fashion, and a sterile drape was applied covering the operative field. Maximum barrier sterile technique with sterile gowns and gloves were used for the procedure. A timeout was performed prior to the initiation of the procedure. Local anesthesia was provided with 1% lidocaine. Under direct ultrasound guidance, the right internal jugular vein was accessed with a micro puncture sheath ultimately allowing placement of a 6 French vascular sheath. With the use of a glidewire, a 5 French angled pigtail catheter was advanced into the right main pulmonary artery. Pressure measurements were then obtained from the right pulmonary artery. Limited right pulmonary arteriogram was performed with a hand injection. Over an exchange length Rosen wire, the pigtail catheter was exchanged for a 105/18 cm multi side-hole EKOS ultrasound assisted infusion catheter. Slightly caudal to the initial access, the right internal jugular vein was again accessed with a micropuncture sheath ultimately allowing placement of a 6French vascular sheath. With the use of a glidewire, a pigtail catheter was advanced into the left main pulmonary artery and a limited left pulmonary arteriogram was performed with a hand injection. Over an exchange length Rosen wire, the pigtail catheter was exchanged for a 105/12 cm multi side-hole EKOS ultrasound assisted infusion catheter. A postprocedural fluoroscopic image was obtained of the check demonstrating final catheter positioning. Both vascular sheath were secured at the right neck with 0 Prolene sutures. The  external catheter tubing was secured at the right chest and the lytic therapy was initiated. The patient tolerated the procedure well without immediate postprocedural complication. FINDINGS: Acquired pressure measurements: Right main pulmonary artery:  51/31(40)mmHg   (normal: < 25/10) Limited pulmonary arteriograms demonstrates moderate incompletely occlusive pulmonary embolism within the distal aspect of the right and left pulmonary arteries and segmental branches, similar to that seen on previous CTA. Both the right and left pulmonary arteries are noted to be markedly enlarged. Following the procedure, both ultrasound assisted infusion catheter tips terminate within the distal aspects of the bilateral lower lobe sub segmental pulmonary arteries. IMPRESSION: 1. Successful fluoroscopic guided initiation of bilateral ultrasound assisted catheter directed pulmonary arterial lysis for sub massive pulmonary embolism and right-sided heart strain. 2. Markedly elevated pressure measurements within the right main pulmonary artery compatible with critical pulmonary arterial hypertension. PLAN: - repeat pressure measurements 12-24 hrs post lysis, and either removal of the catheters or continuation of the catheter directed thrombolysis. Electronically Signed   By: Lucrezia Europe M.D.   On: 08/25/2016 08:30   Ir Jacolyn Reedy F/u Elizabeth Sauer Art/ven Final Day (ms)  Result Date: 08/24/2016 INDICATION: 71 year old male with a history of pulmonary embolus. Status post 12 hours of tPA infusion for catheter directed, ultrasound accelerated lysis EXAM: BEDSIDE TRANSDUCTION OF PULMONARY CATHETERS COMPARISON:  08/23/2016, CT CHEST 08/23/2016 MEDICATIONS: None ANESTHESIA/SEDATION: None FLUOROSCOPY TIME:  None COMPLICATIONS: None TECHNIQUE: Patient remained in the ICU, position on a bed. Sterile technique was used to transduce the indwelling catheters. Catheters were then withdrawn and sterile dressings placed after removal of the sheath. FINDINGS:  Follow-up pressures:  28/21 (25) IMPRESSION: Status post bilateral pulmonary artery thrombolysis with ultrasound accelerated catheter directed tPA infusion, with follow-up demonstrating reduction of pulmonary artery pressures. Signed, Dulcy Fanny. Earleen Newport, DO [name] Electronically Signed   By: Corrie Mckusick D.O.   On: 08/24/2016 12:27    Labs:  CBC:  Recent Labs  08/23/16 2254 08/24/16 0454 08/24/16 1048 08/25/16 0621  WBC 10.0 8.7 8.8 9.2  HGB 10.0* 9.6* 9.8* 9.5*  HCT 33.8* 32.9* 33.6* 32.5*  PLT 246 230 239 252    COAGS:  Recent Labs  02/16/16 1551 08/23/16 0543 08/23/16 1124  INR 1.03 1.12 1.22  APTT  --  26  --     BMP:  Recent Labs  08/23/16 0303 08/23/16 1124 08/24/16 0454 08/25/16 0922  NA 134* 135 141 137  K 2.8* 2.9* 3.0* 3.4*  CL 93* 95* 103 100*  CO2 28 25 28 27   GLUCOSE 330* 194* 162* 206*  BUN 66* 50* 28* 19  CALCIUM 9.3 8.8* 8.6* 9.1  CREATININE 2.07* 1.64* 1.17 1.10  GFRNONAA 31* 40* >60 >60  GFRAA 35* 47* >60 >60    LIVER FUNCTION TESTS:  Recent Labs  04/26/16 2220 07/13/16 2316 07/21/16 1607 08/23/16 1124  BILITOT 0.5 0.4 0.4 0.8  AST 44* 28 27 49*  ALT 28 29 21 24   ALKPHOS 114 76 72 101  PROT 7.0 6.8 6.5 7.1  ALBUMIN 3.0* 3.2* 3.2* 3.3*    Assessment and Plan:  Submassive bilateral pulmonary emboli, s/p EKOS 08/23/2016  Removal of sheath 08/24/2016  D/C on anticoagulation x at least 6 months.  Electronically Signed: Murrell Redden PA-C 08/25/2016, 11:58 AM   I spent a total of 15 Minutes at the the patient's bedside AND on the patient's [name] floor or unit, greater than 50% of which was counseling/coordinating care for f/u after PE lysis

## 2016-08-25 NOTE — Progress Notes (Signed)
PROGRESS NOTE    [name]  PIR:[id number] DOB: Aug 15, 1945 DOA: 08/23/2016 PCP: Volanda Napoleon, MD   Brief Narrative:  71 year old obese male came to the hospital from the rehabilitation facility with history of stroke, right lower extremity fracture, obstructive sleep apnea came with complaints of respiratory distress and hypoxia. Workup revealed submassive PE therefore he was started on heparin drip and sent to [facility] for EKOS. Now he status post thrombolysis and on heparin now.   Assessment & Plan:   Principal Problem:   Pulmonary embolism (HCC) Active Problems:   Diabetes (HCC)   Chronic diastolic CHF (congestive heart failure) (HCC)   Acute on chronic renal failure (HCC)   Acute on chronic respiratory failure (HCC)   Lactic acidosis   Electrolyte imbalance   Current use of proton pump inhibitor   OSA (obstructive sleep apnea)   History of bipolar disorder   History of gastric ulcer   History of asthma   History of chronic pain   Acute pulmonary embolism (HCC)   Pressure injury of skin  Submassive pulmonary embolism -Likely secondary to immobility at the rehabilitation facility from his body habitus -Continue heparin drip at this time. EKOS complete and the catheter is out. -He is hemodynamically stable and oxygenating well -He is due to start Xarelto -Continue to provide supplemental oxygen at this time -Echocardiogram shows ejection fraction of 60-65%, normal wall motion, and diastolic dysfunction. Technical difficulty due to body habitus. -Ultrasound lower extremity Doppler shows subacute DVT involving right distal femoral vein, bilateral popliteal veins and bilateral posterior tibial veins.   chronic diastolic congestive heart failure, compensated -Continue his home medications at this time  Anemia of chronic disease -No signs of active bleeding at this time. Continue to monitor hemoglobin closely especially now that he is going to be on  anticoagulation. Platelets are within normal limits but we will continue to monitor.  Obstructive sleep apnea -CPAP as needed at night.  Diabetes type 2 -Hold metformin at this time -Continue with the insulin sliding scale and Accu-Cheks  Hypothyroidism -On Synthroid  History of chronic pain -Continue gabapentin and OxyIR.   DVT prophylaxis: Hep Drip Code Status: Full Family Communication:  Patient comprehends well  Disposition Plan: Cont Inpatient monitoring today. Likely discharge in next 24-48 hrs.   Consultants:   None  Procedures:   EKOS  Antimicrobials:   None   Subjective: No acute overnight events. No complaints this morning. I explained him the differences between NOAC and Coumadin.   Objective: Vitals:   08/25/16 1200 08/25/16 1255 08/25/16 1300 08/25/16 1400  BP: 135/60 135/60 (!) 138/56 (!) 121/56  Pulse: 95 88 84 100  Resp: (!) 24 16 (!) 23 19  Temp:      TempSrc:      SpO2: 96% 96% 100% 92%  Weight:      Height:        Intake/Output Summary (Last 24 hours) at 08/25/16 1425 Last data filed at 08/25/16 1400  Gross per 24 hour  Intake             1095 ml  Output             2450 ml  Net            -1355 ml   Filed Weights   08/23/16 0945 08/24/16 0500 08/25/16 0356  Weight: (!) 159.3 kg (351 lb 3.1 oz) (!) 159.7 kg (352 lb 1.2 oz) 72.6 kg (160 lb)    Examination:  General exam: Appears calm and comfortable; obese Respiratory system: Clear to auscultation. Respiratory effort normal. Cardiovascular system: S1 & S2 heard, RRR. No JVD, murmurs, rubs, gallops or clicks. No pedal edema. Gastrointestinal system: Abdomen is nondistended, soft and nontender. No organomegaly or masses felt. Normal bowel sounds heard. Central nervous system: Alert and oriented. No focal neurological deficits. Extremities: Symmetric 5 x 5 power. Skin: No rashes, lesions or ulcers Psychiatry: Judgement and insight appear normal. Mood & affect appropriate.      Data Reviewed:   CBC:  Recent Labs Lab 08/23/16 0303  08/23/16 1654 08/23/16 2254 08/24/16 0454 08/24/16 1048 08/25/16 0621  WBC 15.2*  < > 11.0* 10.0 8.7 8.8 9.2  NEUTROABS 13.4*  --   --   --   --   --   --   HGB 12.1*  < > 10.3* 10.0* 9.6* 9.8* 9.5*  HCT 38.4*  < > 34.4* 33.8* 32.9* 33.6* 32.5*  MCV 80.7  < > 82.3 83.3 84.1 84.6 86.2  PLT 335  < > 271 246 230 239 252  < > = values in this interval not displayed. Basic Metabolic Panel:  Recent Labs Lab 08/23/16 0303 08/23/16 1124 08/24/16 0454 08/25/16 0922  NA 134* 135 141 137  K 2.8* 2.9* 3.0* 3.4*  CL 93* 95* 103 100*  CO2 28 25 28 27   GLUCOSE 330* 194* 162* 206*  BUN 66* 50* 28* 19  CREATININE 2.07* 1.64* 1.17 1.10  CALCIUM 9.3 8.8* 8.6* 9.1  MG  --  2.8* 2.7*  --   PHOS  --  3.3 3.0  --    GFR: Estimated Creatinine Clearance: 57.6 mL/min (by C-G formula based on SCr of 1.1 mg/dL). Liver Function Tests:  Recent Labs Lab 08/23/16 1124  AST 49*  ALT 24  ALKPHOS 101  BILITOT 0.8  PROT 7.1  ALBUMIN 3.3*    Recent Labs Lab 08/23/16 1124  LIPASE 21  AMYLASE 63   No results for input(s): AMMONIA in the last 168 hours. Coagulation Profile:  Recent Labs Lab 08/23/16 0543 08/23/16 1124  INR 1.12 1.22   Cardiac Enzymes:  Recent Labs Lab 08/23/16 0303 08/23/16 1124 08/23/16 1638 08/23/16 2238  TROPONINI 0.08* 0.08* 0.13* 0.10*   BNP (last 3 results) No results for input(s): PROBNP in the last 8760 hours. HbA1C:  Recent Labs  08/24/16 1048  HGBA1C 6.8*   CBG:  Recent Labs Lab 08/24/16 1245 08/24/16 1808 08/24/16 2153 08/25/16 0753 08/25/16 1133  GLUCAP 162* 186* 164* 155* 189*   Lipid Profile: No results for input(s): CHOL, HDL, LDLCALC, TRIG, CHOLHDL, LDLDIRECT in the last 72 hours. Thyroid Function Tests: No results for input(s): TSH, T4TOTAL, FREET4, T3FREE, THYROIDAB in the last 72 hours. Anemia Panel: No results for input(s): VITAMINB12, FOLATE, FERRITIN,  TIBC, IRON, RETICCTPCT in the last 72 hours. Sepsis Labs:  Recent Labs Lab 08/23/16 0403 08/23/16 0735 08/23/16 1124 08/23/16 1725  PROCALCITON  --   --  0.10  --   LATICACIDVEN 3.2* 2.4* 2.8* 1.6    Recent Results (from the past 240 hour(s))  Blood culture (routine x 2)     Status: None (Preliminary result)   Collection Time: 08/23/16  4:03 AM  Result Value Ref Range Status   Specimen Description BLOOD LEFT ANTECUBITAL  Final   Special Requests   Final    BOTTLES DRAWN AEROBIC AND ANAEROBIC Blood Culture adequate volume   Culture NO GROWTH 2 DAYS  Final   Report Status PENDING  Incomplete  Blood culture (routine x 2)     Status: None (Preliminary result)   Collection Time: 08/23/16  4:03 AM  Result Value Ref Range Status   Specimen Description BLOOD LEFT ARM  Final   Special Requests   Final    BOTTLES DRAWN AEROBIC AND ANAEROBIC Blood Culture adequate volume   Culture NO GROWTH 2 DAYS  Final   Report Status PENDING  Incomplete  Urine culture     Status: Abnormal   Collection Time: 08/23/16  5:43 AM  Result Value Ref Range Status   Specimen Description URINE, RANDOM  Final   Special Requests NONE  Final   Culture MULTIPLE SPECIES PRESENT, SUGGEST RECOLLECTION (A)  Final   Report Status 08/24/2016 FINAL  Final  MRSA PCR Screening     Status: Abnormal   Collection Time: 08/23/16  9:40 AM  Result Value Ref Range Status   MRSA by PCR POSITIVE (A) NEGATIVE Final    Comment:        The GeneXpert MRSA Assay (FDA approved for NASAL specimens only), is one component of a comprehensive MRSA colonization surveillance program. It is not intended to diagnose MRSA infection nor to guide or monitor treatment for MRSA infections. RESULT CALLED TO, READ BACK BY AND VERIFIED WITH: J JAMES,RN AT 1224 08/23/16 BY L BENFIELD          Radiology Studies: Ir Angiogram Pulmonary Bilateral Selective  Result Date: 08/25/2016 INDICATION: Sub massive bilateral pulmonary emboli with  shortness of breath and right heart strain. EXAM: 1. ULTRASOUND GUIDANCE FOR VENOUS ACCESS X2 2. BILATERAL PULMONARY ARTERIOGRAPHY 3. FLUOROSCOPIC GUIDED PLACEMENT OF BILATERAL PULMONARY ARTERIAL LYTIC INFUSION CATHETERS COMPARISON:  Chest CTA - 08/23/2016 MEDICATIONS: Intravenous Fentanyl and Versed were administered as conscious sedation during continuous monitoring of the patient's level of consciousness and physiological / cardiorespiratory status by the radiology RN, with a total moderate sedation time of 33 minutes. CONTRAST:  Isovue 5 mL FLUOROSCOPY TIME:  4 minutes 12 second (218  uGym2 DAP) COMPLICATIONS: None immediate TECHNIQUE: Informed written consent was obtained from the patient after a discussion of the risks, benefits and alternatives to treatment. Questions regarding the procedure were encouraged and answered. A timeout was performed prior to the initiation of the procedure. Ultrasound scanning was performed of the right IJ vein demonstrating adequate size and patency. As such, the right internal jugular vein was selected for vascular access. The right neck was prepped and draped in the usual sterile fashion, and a sterile drape was applied covering the operative field. Maximum barrier sterile technique with sterile gowns and gloves were used for the procedure. A timeout was performed prior to the initiation of the procedure. Local anesthesia was provided with 1% lidocaine. Under direct ultrasound guidance, the right internal jugular vein was accessed with a micro puncture sheath ultimately allowing placement of a 6 French vascular sheath. With the use of a glidewire, a 5 French angled pigtail catheter was advanced into the right main pulmonary artery. Pressure measurements were then obtained from the right pulmonary artery. Limited right pulmonary arteriogram was performed with a hand injection. Over an exchange length Rosen wire, the pigtail catheter was exchanged for a 105/18 cm multi side-hole  EKOS ultrasound assisted infusion catheter. Slightly caudal to the initial access, the right internal jugular vein was again accessed with a micropuncture sheath ultimately allowing placement of a 6French vascular sheath. With the use of a glidewire, a pigtail catheter was advanced into the left main pulmonary artery and a  limited left pulmonary arteriogram was performed with a hand injection. Over an exchange length Rosen wire, the pigtail catheter was exchanged for a 105/12 cm multi side-hole EKOS ultrasound assisted infusion catheter. A postprocedural fluoroscopic image was obtained of the check demonstrating final catheter positioning. Both vascular sheath were secured at the right neck with 0 Prolene sutures. The external catheter tubing was secured at the right chest and the lytic therapy was initiated. The patient tolerated the procedure well without immediate postprocedural complication. FINDINGS: Acquired pressure measurements: Right main pulmonary artery:  51/31(40)mmHg   (normal: < 25/10) Limited pulmonary arteriograms demonstrates moderate incompletely occlusive pulmonary embolism within the distal aspect of the right and left pulmonary arteries and segmental branches, similar to that seen on previous CTA. Both the right and left pulmonary arteries are noted to be markedly enlarged. Following the procedure, both ultrasound assisted infusion catheter tips terminate within the distal aspects of the bilateral lower lobe sub segmental pulmonary arteries. IMPRESSION: 1. Successful fluoroscopic guided initiation of bilateral ultrasound assisted catheter directed pulmonary arterial lysis for sub massive pulmonary embolism and right-sided heart strain. 2. Markedly elevated pressure measurements within the right main pulmonary artery compatible with critical pulmonary arterial hypertension. PLAN: - repeat pressure measurements 12-24 hrs post lysis, and either removal of the catheters or continuation of the  catheter directed thrombolysis. Electronically Signed   By: Lucrezia Europe M.D.   On: 08/25/2016 08:30   Ir Angiogram Selective Each Additional Vessel  Result Date: 08/25/2016 INDICATION: Sub massive bilateral pulmonary emboli with shortness of breath and right heart strain. EXAM: 1. ULTRASOUND GUIDANCE FOR VENOUS ACCESS X2 2. BILATERAL PULMONARY ARTERIOGRAPHY 3. FLUOROSCOPIC GUIDED PLACEMENT OF BILATERAL PULMONARY ARTERIAL LYTIC INFUSION CATHETERS COMPARISON:  Chest CTA - 08/23/2016 MEDICATIONS: Intravenous Fentanyl and Versed were administered as conscious sedation during continuous monitoring of the patient's level of consciousness and physiological / cardiorespiratory status by the radiology RN, with a total moderate sedation time of 33 minutes. CONTRAST:  Isovue 5 mL FLUOROSCOPY TIME:  4 minutes 12 second (218  uGym2 DAP) COMPLICATIONS: None immediate TECHNIQUE: Informed written consent was obtained from the patient after a discussion of the risks, benefits and alternatives to treatment. Questions regarding the procedure were encouraged and answered. A timeout was performed prior to the initiation of the procedure. Ultrasound scanning was performed of the right IJ vein demonstrating adequate size and patency. As such, the right internal jugular vein was selected for vascular access. The right neck was prepped and draped in the usual sterile fashion, and a sterile drape was applied covering the operative field. Maximum barrier sterile technique with sterile gowns and gloves were used for the procedure. A timeout was performed prior to the initiation of the procedure. Local anesthesia was provided with 1% lidocaine. Under direct ultrasound guidance, the right internal jugular vein was accessed with a micro puncture sheath ultimately allowing placement of a 6 French vascular sheath. With the use of a glidewire, a 5 French angled pigtail catheter was advanced into the right main pulmonary artery. Pressure  measurements were then obtained from the right pulmonary artery. Limited right pulmonary arteriogram was performed with a hand injection. Over an exchange length Rosen wire, the pigtail catheter was exchanged for a 105/18 cm multi side-hole EKOS ultrasound assisted infusion catheter. Slightly caudal to the initial access, the right internal jugular vein was again accessed with a micropuncture sheath ultimately allowing placement of a 6French vascular sheath. With the use of a glidewire, a pigtail catheter was advanced into the  left main pulmonary artery and a limited left pulmonary arteriogram was performed with a hand injection. Over an exchange length Rosen wire, the pigtail catheter was exchanged for a 105/12 cm multi side-hole EKOS ultrasound assisted infusion catheter. A postprocedural fluoroscopic image was obtained of the check demonstrating final catheter positioning. Both vascular sheath were secured at the right neck with 0 Prolene sutures. The external catheter tubing was secured at the right chest and the lytic therapy was initiated. The patient tolerated the procedure well without immediate postprocedural complication. FINDINGS: Acquired pressure measurements: Right main pulmonary artery:  51/31(40)mmHg   (normal: < 25/10) Limited pulmonary arteriograms demonstrates moderate incompletely occlusive pulmonary embolism within the distal aspect of the right and left pulmonary arteries and segmental branches, similar to that seen on previous CTA. Both the right and left pulmonary arteries are noted to be markedly enlarged. Following the procedure, both ultrasound assisted infusion catheter tips terminate within the distal aspects of the bilateral lower lobe sub segmental pulmonary arteries. IMPRESSION: 1. Successful fluoroscopic guided initiation of bilateral ultrasound assisted catheter directed pulmonary arterial lysis for sub massive pulmonary embolism and right-sided heart strain. 2. Markedly elevated  pressure measurements within the right main pulmonary artery compatible with critical pulmonary arterial hypertension. PLAN: - repeat pressure measurements 12-24 hrs post lysis, and either removal of the catheters or continuation of the catheter directed thrombolysis. Electronically Signed   By: Lucrezia Europe M.D.   On: 08/25/2016 08:30   Ir Angiogram Selective Each Additional Vessel  Result Date: 08/25/2016 INDICATION: Sub massive bilateral pulmonary emboli with shortness of breath and right heart strain. EXAM: 1. ULTRASOUND GUIDANCE FOR VENOUS ACCESS X2 2. BILATERAL PULMONARY ARTERIOGRAPHY 3. FLUOROSCOPIC GUIDED PLACEMENT OF BILATERAL PULMONARY ARTERIAL LYTIC INFUSION CATHETERS COMPARISON:  Chest CTA - 08/23/2016 MEDICATIONS: Intravenous Fentanyl and Versed were administered as conscious sedation during continuous monitoring of the patient's level of consciousness and physiological / cardiorespiratory status by the radiology RN, with a total moderate sedation time of 33 minutes. CONTRAST:  Isovue 5 mL FLUOROSCOPY TIME:  4 minutes 12 second (218  uGym2 DAP) COMPLICATIONS: None immediate TECHNIQUE: Informed written consent was obtained from the patient after a discussion of the risks, benefits and alternatives to treatment. Questions regarding the procedure were encouraged and answered. A timeout was performed prior to the initiation of the procedure. Ultrasound scanning was performed of the right IJ vein demonstrating adequate size and patency. As such, the right internal jugular vein was selected for vascular access. The right neck was prepped and draped in the usual sterile fashion, and a sterile drape was applied covering the operative field. Maximum barrier sterile technique with sterile gowns and gloves were used for the procedure. A timeout was performed prior to the initiation of the procedure. Local anesthesia was provided with 1% lidocaine. Under direct ultrasound guidance, the right internal jugular vein  was accessed with a micro puncture sheath ultimately allowing placement of a 6 French vascular sheath. With the use of a glidewire, a 5 French angled pigtail catheter was advanced into the right main pulmonary artery. Pressure measurements were then obtained from the right pulmonary artery. Limited right pulmonary arteriogram was performed with a hand injection. Over an exchange length Rosen wire, the pigtail catheter was exchanged for a 105/18 cm multi side-hole EKOS ultrasound assisted infusion catheter. Slightly caudal to the initial access, the right internal jugular vein was again accessed with a micropuncture sheath ultimately allowing placement of a 6French vascular sheath. With the use of a glidewire, a  pigtail catheter was advanced into the left main pulmonary artery and a limited left pulmonary arteriogram was performed with a hand injection. Over an exchange length Rosen wire, the pigtail catheter was exchanged for a 105/12 cm multi side-hole EKOS ultrasound assisted infusion catheter. A postprocedural fluoroscopic image was obtained of the check demonstrating final catheter positioning. Both vascular sheath were secured at the right neck with 0 Prolene sutures. The external catheter tubing was secured at the right chest and the lytic therapy was initiated. The patient tolerated the procedure well without immediate postprocedural complication. FINDINGS: Acquired pressure measurements: Right main pulmonary artery:  51/31(40)mmHg   (normal: < 25/10) Limited pulmonary arteriograms demonstrates moderate incompletely occlusive pulmonary embolism within the distal aspect of the right and left pulmonary arteries and segmental branches, similar to that seen on previous CTA. Both the right and left pulmonary arteries are noted to be markedly enlarged. Following the procedure, both ultrasound assisted infusion catheter tips terminate within the distal aspects of the bilateral lower lobe sub segmental pulmonary  arteries. IMPRESSION: 1. Successful fluoroscopic guided initiation of bilateral ultrasound assisted catheter directed pulmonary arterial lysis for sub massive pulmonary embolism and right-sided heart strain. 2. Markedly elevated pressure measurements within the right main pulmonary artery compatible with critical pulmonary arterial hypertension. PLAN: - repeat pressure measurements 12-24 hrs post lysis, and either removal of the catheters or continuation of the catheter directed thrombolysis. Electronically Signed   By: Lucrezia Europe M.D.   On: 08/25/2016 08:30   Ir US Guide Vasc Access Right  Result Date: 08/25/2016 INDICATION: Sub massive bilateral pulmonary emboli with shortness of breath and right heart strain. EXAM: 1. ULTRASOUND GUIDANCE FOR VENOUS ACCESS X2 2. BILATERAL PULMONARY ARTERIOGRAPHY 3. FLUOROSCOPIC GUIDED PLACEMENT OF BILATERAL PULMONARY ARTERIAL LYTIC INFUSION CATHETERS COMPARISON:  Chest CTA - 08/23/2016 MEDICATIONS: Intravenous Fentanyl and Versed were administered as conscious sedation during continuous monitoring of the patient's level of consciousness and physiological / cardiorespiratory status by the radiology RN, with a total moderate sedation time of 33 minutes. CONTRAST:  Isovue 5 mL FLUOROSCOPY TIME:  4 minutes 12 second (218  uGym2 DAP) COMPLICATIONS: None immediate TECHNIQUE: Informed written consent was obtained from the patient after a discussion of the risks, benefits and alternatives to treatment. Questions regarding the procedure were encouraged and answered. A timeout was performed prior to the initiation of the procedure. Ultrasound scanning was performed of the right IJ vein demonstrating adequate size and patency. As such, the right internal jugular vein was selected for vascular access. The right neck was prepped and draped in the usual sterile fashion, and a sterile drape was applied covering the operative field. Maximum barrier sterile technique with sterile gowns and  gloves were used for the procedure. A timeout was performed prior to the initiation of the procedure. Local anesthesia was provided with 1% lidocaine. Under direct ultrasound guidance, the right internal jugular vein was accessed with a micro puncture sheath ultimately allowing placement of a 6 French vascular sheath. With the use of a glidewire, a 5 French angled pigtail catheter was advanced into the right main pulmonary artery. Pressure measurements were then obtained from the right pulmonary artery. Limited right pulmonary arteriogram was performed with a hand injection. Over an exchange length Rosen wire, the pigtail catheter was exchanged for a 105/18 cm multi side-hole EKOS ultrasound assisted infusion catheter. Slightly caudal to the initial access, the right internal jugular vein was again accessed with a micropuncture sheath ultimately allowing placement of a 6French vascular sheath. With  the use of a glidewire, a pigtail catheter was advanced into the left main pulmonary artery and a limited left pulmonary arteriogram was performed with a hand injection. Over an exchange length Rosen wire, the pigtail catheter was exchanged for a 105/12 cm multi side-hole EKOS ultrasound assisted infusion catheter. A postprocedural fluoroscopic image was obtained of the check demonstrating final catheter positioning. Both vascular sheath were secured at the right neck with 0 Prolene sutures. The external catheter tubing was secured at the right chest and the lytic therapy was initiated. The patient tolerated the procedure well without immediate postprocedural complication. FINDINGS: Acquired pressure measurements: Right main pulmonary artery:  51/31(40)mmHg   (normal: < 25/10) Limited pulmonary arteriograms demonstrates moderate incompletely occlusive pulmonary embolism within the distal aspect of the right and left pulmonary arteries and segmental branches, similar to that seen on previous CTA. Both the right and left  pulmonary arteries are noted to be markedly enlarged. Following the procedure, both ultrasound assisted infusion catheter tips terminate within the distal aspects of the bilateral lower lobe sub segmental pulmonary arteries. IMPRESSION: 1. Successful fluoroscopic guided initiation of bilateral ultrasound assisted catheter directed pulmonary arterial lysis for sub massive pulmonary embolism and right-sided heart strain. 2. Markedly elevated pressure measurements within the right main pulmonary artery compatible with critical pulmonary arterial hypertension. PLAN: - repeat pressure measurements 12-24 hrs post lysis, and either removal of the catheters or continuation of the catheter directed thrombolysis. Electronically Signed   By: Lucrezia Europe M.D.   On: 08/25/2016 08:30   Dg Chest Port 1 View  Result Date: 08/24/2016 CLINICAL DATA:  71 year old male with pulmonary embolus. Lysis. Subsequent encounter. EXAM: PORTABLE CHEST 1 VIEW COMPARISON:  None. FINDINGS: Pulmonary artery catheters in place with tips projecting at the level of the right lower lobe pulmonary artery and left lower lobe pulmonary artery. No pneumothorax. Pulmonary vascular congestion. Elevated right hemidiaphragm with right base subsegmental atelectasis. Cardiomegaly. Fractured sternal wires. IMPRESSION: Pulmonary artery catheters in place at the level of the right lower lobe pulmonary artery and left lower lobe pulmonary artery. Pulmonary vascular congestion. Elevated right hemidiaphragm with right base subsegmental atelectasis. Cardiomegaly. Electronically Signed   By: Genia Del M.D.   On: 08/24/2016 10:41   Ir Infusion Thrombol Arterial Initial (ms)  Result Date: 08/25/2016 INDICATION: Sub massive bilateral pulmonary emboli with shortness of breath and right heart strain. EXAM: 1. ULTRASOUND GUIDANCE FOR VENOUS ACCESS X2 2. BILATERAL PULMONARY ARTERIOGRAPHY 3. FLUOROSCOPIC GUIDED PLACEMENT OF BILATERAL PULMONARY ARTERIAL LYTIC INFUSION  CATHETERS COMPARISON:  Chest CTA - 08/23/2016 MEDICATIONS: Intravenous Fentanyl and Versed were administered as conscious sedation during continuous monitoring of the patient's level of consciousness and physiological / cardiorespiratory status by the radiology RN, with a total moderate sedation time of 33 minutes. CONTRAST:  Isovue 5 mL FLUOROSCOPY TIME:  4 minutes 12 second (218  uGym2 DAP) COMPLICATIONS: None immediate TECHNIQUE: Informed written consent was obtained from the patient after a discussion of the risks, benefits and alternatives to treatment. Questions regarding the procedure were encouraged and answered. A timeout was performed prior to the initiation of the procedure. Ultrasound scanning was performed of the right IJ vein demonstrating adequate size and patency. As such, the right internal jugular vein was selected for vascular access. The right neck was prepped and draped in the usual sterile fashion, and a sterile drape was applied covering the operative field. Maximum barrier sterile technique with sterile gowns and gloves were used for the procedure. A timeout was performed prior to  the initiation of the procedure. Local anesthesia was provided with 1% lidocaine. Under direct ultrasound guidance, the right internal jugular vein was accessed with a micro puncture sheath ultimately allowing placement of a 6 French vascular sheath. With the use of a glidewire, a 5 French angled pigtail catheter was advanced into the right main pulmonary artery. Pressure measurements were then obtained from the right pulmonary artery. Limited right pulmonary arteriogram was performed with a hand injection. Over an exchange length Rosen wire, the pigtail catheter was exchanged for a 105/18 cm multi side-hole EKOS ultrasound assisted infusion catheter. Slightly caudal to the initial access, the right internal jugular vein was again accessed with a micropuncture sheath ultimately allowing placement of a 6French  vascular sheath. With the use of a glidewire, a pigtail catheter was advanced into the left main pulmonary artery and a limited left pulmonary arteriogram was performed with a hand injection. Over an exchange length Rosen wire, the pigtail catheter was exchanged for a 105/12 cm multi side-hole EKOS ultrasound assisted infusion catheter. A postprocedural fluoroscopic image was obtained of the check demonstrating final catheter positioning. Both vascular sheath were secured at the right neck with 0 Prolene sutures. The external catheter tubing was secured at the right chest and the lytic therapy was initiated. The patient tolerated the procedure well without immediate postprocedural complication. FINDINGS: Acquired pressure measurements: Right main pulmonary artery:  51/31(40)mmHg   (normal: < 25/10) Limited pulmonary arteriograms demonstrates moderate incompletely occlusive pulmonary embolism within the distal aspect of the right and left pulmonary arteries and segmental branches, similar to that seen on previous CTA. Both the right and left pulmonary arteries are noted to be markedly enlarged. Following the procedure, both ultrasound assisted infusion catheter tips terminate within the distal aspects of the bilateral lower lobe sub segmental pulmonary arteries. IMPRESSION: 1. Successful fluoroscopic guided initiation of bilateral ultrasound assisted catheter directed pulmonary arterial lysis for sub massive pulmonary embolism and right-sided heart strain. 2. Markedly elevated pressure measurements within the right main pulmonary artery compatible with critical pulmonary arterial hypertension. PLAN: - repeat pressure measurements 12-24 hrs post lysis, and either removal of the catheters or continuation of the catheter directed thrombolysis. Electronically Signed   By: Lucrezia Europe M.D.   On: 08/25/2016 08:30   Ir Infusion Thrombol Arterial Initial (ms)  Result Date: 08/25/2016 INDICATION: Sub massive bilateral  pulmonary emboli with shortness of breath and right heart strain. EXAM: 1. ULTRASOUND GUIDANCE FOR VENOUS ACCESS X2 2. BILATERAL PULMONARY ARTERIOGRAPHY 3. FLUOROSCOPIC GUIDED PLACEMENT OF BILATERAL PULMONARY ARTERIAL LYTIC INFUSION CATHETERS COMPARISON:  Chest CTA - 08/23/2016 MEDICATIONS: Intravenous Fentanyl and Versed were administered as conscious sedation during continuous monitoring of the patient's level of consciousness and physiological / cardiorespiratory status by the radiology RN, with a total moderate sedation time of 33 minutes. CONTRAST:  Isovue 5 mL FLUOROSCOPY TIME:  4 minutes 12 second (218  uGym2 DAP) COMPLICATIONS: None immediate TECHNIQUE: Informed written consent was obtained from the patient after a discussion of the risks, benefits and alternatives to treatment. Questions regarding the procedure were encouraged and answered. A timeout was performed prior to the initiation of the procedure. Ultrasound scanning was performed of the right IJ vein demonstrating adequate size and patency. As such, the right internal jugular vein was selected for vascular access. The right neck was prepped and draped in the usual sterile fashion, and a sterile drape was applied covering the operative field. Maximum barrier sterile technique with sterile gowns and gloves were used for the procedure.  A timeout was performed prior to the initiation of the procedure. Local anesthesia was provided with 1% lidocaine. Under direct ultrasound guidance, the right internal jugular vein was accessed with a micro puncture sheath ultimately allowing placement of a 6 French vascular sheath. With the use of a glidewire, a 5 French angled pigtail catheter was advanced into the right main pulmonary artery. Pressure measurements were then obtained from the right pulmonary artery. Limited right pulmonary arteriogram was performed with a hand injection. Over an exchange length Rosen wire, the pigtail catheter was exchanged for a  105/18 cm multi side-hole EKOS ultrasound assisted infusion catheter. Slightly caudal to the initial access, the right internal jugular vein was again accessed with a micropuncture sheath ultimately allowing placement of a 6French vascular sheath. With the use of a glidewire, a pigtail catheter was advanced into the left main pulmonary artery and a limited left pulmonary arteriogram was performed with a hand injection. Over an exchange length Rosen wire, the pigtail catheter was exchanged for a 105/12 cm multi side-hole EKOS ultrasound assisted infusion catheter. A postprocedural fluoroscopic image was obtained of the check demonstrating final catheter positioning. Both vascular sheath were secured at the right neck with 0 Prolene sutures. The external catheter tubing was secured at the right chest and the lytic therapy was initiated. The patient tolerated the procedure well without immediate postprocedural complication. FINDINGS: Acquired pressure measurements: Right main pulmonary artery:  51/31(40)mmHg   (normal: < 25/10) Limited pulmonary arteriograms demonstrates moderate incompletely occlusive pulmonary embolism within the distal aspect of the right and left pulmonary arteries and segmental branches, similar to that seen on previous CTA. Both the right and left pulmonary arteries are noted to be markedly enlarged. Following the procedure, both ultrasound assisted infusion catheter tips terminate within the distal aspects of the bilateral lower lobe sub segmental pulmonary arteries. IMPRESSION: 1. Successful fluoroscopic guided initiation of bilateral ultrasound assisted catheter directed pulmonary arterial lysis for sub massive pulmonary embolism and right-sided heart strain. 2. Markedly elevated pressure measurements within the right main pulmonary artery compatible with critical pulmonary arterial hypertension. PLAN: - repeat pressure measurements 12-24 hrs post lysis, and either removal of the catheters or  continuation of the catheter directed thrombolysis. Electronically Signed   By: Lucrezia Europe M.D.   On: 08/25/2016 08:30   Ir Jacolyn Reedy F/u Elizabeth Sauer Art/ven Final Day (ms)  Result Date: 08/24/2016 INDICATION: 71 year old male with a history of pulmonary embolus. Status post 12 hours of tPA infusion for catheter directed, ultrasound accelerated lysis EXAM: BEDSIDE TRANSDUCTION OF PULMONARY CATHETERS COMPARISON:  08/23/2016, CT CHEST 08/23/2016 MEDICATIONS: None ANESTHESIA/SEDATION: None FLUOROSCOPY TIME:  None COMPLICATIONS: None TECHNIQUE: Patient remained in the ICU, position on a bed. Sterile technique was used to transduce the indwelling catheters. Catheters were then withdrawn and sterile dressings placed after removal of the sheath. FINDINGS: Follow-up pressures:  28/21 (25) IMPRESSION: Status post bilateral pulmonary artery thrombolysis with ultrasound accelerated catheter directed tPA infusion, with follow-up demonstrating reduction of pulmonary artery pressures. Signed, Dulcy Fanny. Earleen Newport, DO [facility] Electronically Signed   By: Corrie Mckusick D.O.   On: 08/24/2016 12:27        Scheduled Meds: . budesonide (PULMICORT) nebulizer solution  0.25 mg Nebulization BID  . buPROPion  100 mg Oral BID  . Chlorhexidine Gluconate Cloth  6 each Topical Q0600  . docusate sodium  100 mg Oral TID  . gabapentin  400 mg Oral TID  . insulin aspart  0-20 Units Subcutaneous  TID WC  . ipratropium-albuterol  3 mL Nebulization QID  . montelukast  10 mg Oral QHS  . mupirocin cream   Topical Daily  . mupirocin ointment  1 application Nasal BID  . pantoprazole  40 mg Oral Daily  . potassium chloride  40 mEq Oral Once  . [START ON 08/26/2016] Rivaroxaban  15 mg Oral BID WC  . [START ON 09/16/2016] rivaroxaban  20 mg Oral Q supper  . senna  2 tablet Oral Daily  . sodium chloride flush  3 mL Intravenous Q12H  . ziprasidone  80 mg Oral BID WC   Continuous  Infusions: . sodium chloride Stopped (08/24/16 1125)  . sodium chloride Stopped (08/24/16 1125)  . sodium chloride Stopped (08/24/16 1125)  . sodium chloride Stopped (08/24/16 1125)  . heparin 1,500 Units/hr (08/25/16 0358)     LOS: 2 days    Time spent: 35 mins    Abbigail Anstey Arsenio Loader, MD [facility] Pager [phone]   If 7PM-7AM, please contact night-coverage [email or web address] Password [email or web address] 08/25/2016, 2:25 PM

## 2016-08-25 NOTE — Progress Notes (Addendum)
ANTICOAGULATION CONSULT NOTE - Follow Up Consult  Pharmacy Consult for Heparin  Indication: pulmonary embolus, on EKOS protocol  Assessment: 71 y/o M with bilateral PE on EKOS protocol. Pharmacy consulted for heparin management. Heparin level therapeutic (0.45) on 1500 units/hr. Hemoglobin down to 9.5, platelets wnl. No overt bleeding noted.   Per discussion with patient, he would like to be transitioned to Xarelto when medically ready. He doesn't want the additional doctor visits with warfarin, and prefers once daily dosing after the initial 3 weeks. We discussed the differences between DOACs and warfarin, including benefits of less monitoring and less drug-drug interactions, as well as risks of no available reversal agent.    Goal of Therapy:  Heparin level 0.3-0.7 units/ml Monitor platelets by anticoagulation protocol: Yes   Plan: -Cont heparin 1500 units/hr -Transition to Xarelto tomorrow morning at 0800 -Case management consult for Xarelto coverage on discharge -Monitor s/sx's of bleeding  Patient Measurements: Height: 5\' 7"  (170.2 cm) Weight: 160 lb (72.6 kg) IBW/kg (Calculated) : 66.1  Vital Signs: Temp: 98.9 F (37.2 C) (04/03 1140) Temp Source: Oral (04/03 1140) BP: 135/60 (04/03 1200) Pulse Rate: 95 (04/03 1200)  Labs:  Recent Labs  08/23/16 0543 08/23/16 1124 08/23/16 1638  08/23/16 2238  08/24/16 0454 08/24/16 1000 08/24/16 1048 08/24/16 1809 08/25/16 0621 08/25/16 0922  HGB  --  10.4*  --   < >  --   < > 9.6*  --  9.8*  --  9.5*  --   HCT  --  34.5*  --   < >  --   < > 32.9*  --  33.6*  --  32.5*  --   PLT  --  283  --   < >  --   < > 230  --  239  --  252  --   APTT 26  --   --   --   --   --   --   --   --   --   --   --   LABPROT 14.5 15.4*  --   --   --   --   --   --   --   --   --   --   INR 1.12 1.22  --   --   --   --   --   --   --   --   --   --   HEPARINUNFRC <0.10* 0.72*  --   < >  --   < >  --  0.49  --  0.43 0.45  --   CREATININE  --   1.64*  --   --   --   --  1.17  --   --   --   --  1.10  TROPONINI  --  0.08* 0.13*  --  0.10*  --   --   --   --   --   --   --   < > = values in this interval not displayed.  Estimated Creatinine Clearance: 57.6 mL/min (by C-G formula based on SCr of 1.1 mg/dL).   Belia Heman, PharmD PGY1 Pharmacy Resident [phone] (Pager) 08/25/2016 12:25 PM

## 2016-08-26 ENCOUNTER — Encounter (HOSPITAL_COMMUNITY): Payer: Self-pay

## 2016-08-26 DIAGNOSIS — Z87898 Personal history of other specified conditions: Secondary | ICD-10-CM

## 2016-08-26 DIAGNOSIS — G4733 Obstructive sleep apnea (adult) (pediatric): Secondary | ICD-10-CM

## 2016-08-26 DIAGNOSIS — E878 Other disorders of electrolyte and fluid balance, not elsewhere classified: Secondary | ICD-10-CM

## 2016-08-26 LAB — GLUCOSE, CAPILLARY
GLUCOSE-CAPILLARY: 198 mg/dL — AB (ref 65–99)
GLUCOSE-CAPILLARY: 145 mg/dL — AB (ref 65–99)
Glucose-Capillary: 162 mg/dL — ABNORMAL HIGH (ref 65–99)
Glucose-Capillary: 171 mg/dL — ABNORMAL HIGH (ref 65–99)

## 2016-08-26 LAB — CBC
HEMATOCRIT: 34.5 % — AB (ref 39.0–52.0)
HEMOGLOBIN: 10 g/dL — AB (ref 13.0–17.0)
MCH: 24.4 pg — ABNORMAL LOW (ref 26.0–34.0)
MCHC: 29 g/dL — AB (ref 30.0–36.0)
MCV: 84.4 fL (ref 78.0–100.0)
Platelets: 271 10*3/uL (ref 150–400)
RBC: 4.09 MIL/uL — AB (ref 4.22–5.81)
RDW: 16.4 % — ABNORMAL HIGH (ref 11.5–15.5)
WBC: 11 10*3/uL — ABNORMAL HIGH (ref 4.0–10.5)

## 2016-08-26 MED ORDER — METOPROLOL TARTRATE 12.5 MG HALF TABLET
12.5000 mg | ORAL_TABLET | Freq: Two times a day (BID) | ORAL | Status: DC
  Administered 2016-08-26 – 2016-08-27 (×3): 12.5 mg via ORAL
  Filled 2016-08-26 (×4): qty 1

## 2016-08-26 MED ORDER — GABAPENTIN 400 MG PO CAPS
400.0000 mg | ORAL_CAPSULE | Freq: Three times a day (TID) | ORAL | Status: DC
  Administered 2016-08-26 – 2016-08-27 (×4): 400 mg via ORAL
  Filled 2016-08-26 (×6): qty 1

## 2016-08-26 MED ORDER — FUROSEMIDE 10 MG/ML IJ SOLN
60.0000 mg | Freq: Two times a day (BID) | INTRAMUSCULAR | Status: DC
  Administered 2016-08-26 – 2016-08-27 (×3): 60 mg via INTRAVENOUS
  Filled 2016-08-26 (×5): qty 6

## 2016-08-26 MED ORDER — BUPROPION HCL ER (SR) 100 MG PO TB12
100.0000 mg | ORAL_TABLET | Freq: Every day | ORAL | Status: DC
  Administered 2016-08-27: 100 mg via ORAL
  Filled 2016-08-26 (×2): qty 1

## 2016-08-26 MED ORDER — POTASSIUM CHLORIDE CRYS ER 20 MEQ PO TBCR
40.0000 meq | EXTENDED_RELEASE_TABLET | Freq: Once | ORAL | Status: AC
  Administered 2016-08-26: 40 meq via ORAL
  Filled 2016-08-26: qty 2

## 2016-08-26 MED ORDER — ZIPRASIDONE HCL 80 MG PO CAPS: 80.0000 mg | ORAL_CAPSULE | Freq: Two times a day (BID) | ORAL | Status: DC

## 2016-08-26 MED ORDER — LEVOTHYROXINE SODIUM 100 MCG PO TABS
100.0000 ug | ORAL_TABLET | Freq: Every day | ORAL | Status: DC
  Administered 2016-08-27: 100 ug via ORAL
  Filled 2016-08-26: qty 1

## 2016-08-26 MED ORDER — ATORVASTATIN CALCIUM 80 MG PO TABS
80.0000 mg | ORAL_TABLET | Freq: Every evening | ORAL | Status: DC
  Administered 2016-08-26 – 2016-08-27 (×2): 80 mg via ORAL
  Filled 2016-08-26 (×2): qty 1

## 2016-08-26 MED ORDER — MOMETASONE FURO-FORMOTEROL FUM 100-5 MCG/ACT IN AERO
2.0000 | INHALATION_SPRAY | Freq: Two times a day (BID) | RESPIRATORY_TRACT | Status: DC
  Administered 2016-08-27: 2 via RESPIRATORY_TRACT
  Filled 2016-08-26 (×2): qty 8.8

## 2016-08-26 MED ORDER — INSULIN GLARGINE 100 UNIT/ML ~~LOC~~ SOLN
14.0000 [IU] | Freq: Every day | SUBCUTANEOUS | Status: DC
  Administered 2016-08-26: 14 [IU] via SUBCUTANEOUS
  Filled 2016-08-26 (×3): qty 0.14

## 2016-08-26 MED ORDER — METOPROLOL TARTRATE 50 MG PO TABS: 50.0000 mg | ORAL_TABLET | Freq: Two times a day (BID) | ORAL | Status: DC

## 2016-08-26 MED ORDER — FUROSEMIDE 40 MG PO TABS: 40.0000 mg | ORAL_TABLET | Freq: Two times a day (BID) | ORAL | Status: DC

## 2016-08-26 NOTE — NC FL2 (Signed)
[place] LEVEL OF CARE SCREENING TOOL     IDENTIFICATION  Patient Name: [name] Birthdate: April 19, 1946 Sex: male Admission Date (Current Location): 08/23/2016  [facility] [place] Number:  Herbalist and Address:  [facility], [place] [address] [place], [place] [address]      Provider Number: [id number]  Attending Physician Name and Address:  Cherene Altes, MD  Relative Name and Phone Number:  Akira Adelsberger, [phone]    Current Level of Care: Hospital Recommended Level of Care: [place] Prior Approval Number:    Date Approved/Denied:   PASRR Number: [id number]  Discharge Plan: SNF    Current Diagnoses: Patient Active Problem List   Diagnosis Date Noted  . Pressure injury of skin 08/24/2016  . Pulmonary embolism ([place]) 08/23/2016  . SIRS (systemic inflammatory response syndrome) ([place]) 08/23/2016  . Acute on chronic renal failure ([place]) 08/23/2016  . Acute on chronic respiratory failure ([place]) 08/23/2016  . Lactic acidosis 08/23/2016  . Electrolyte imbalance 08/23/2016  . Current use of proton pump inhibitor 08/23/2016  . OSA (obstructive sleep apnea) 08/23/2016  . History of bipolar disorder 08/23/2016  . History of gastric ulcer 08/23/2016  . History of asthma 08/23/2016  . History of chronic pain 08/23/2016  . Acute pulmonary embolism ([place]) 08/23/2016  . Chest pain 07/14/2016  . UTI (urinary tract infection) 04/27/2016  . CAD (coronary artery disease) 04/27/2016  . Diabetes ([place]) 04/27/2016  . HTN (hypertension) 04/27/2016  . Chronic diastolic CHF (congestive heart failure) ([place]) 04/27/2016  . CKD (chronic kidney disease), stage III 04/27/2016  . Chest pain, rule out acute myocardial infarction 02/16/2016    Orientation RESPIRATION BLADDER Height & Weight     Self, Time, Situation, Place  Normal Incontinent Weight: (!) 356 lb 0.7 oz (161.5 kg) Height:  5\' 7"  (170.2 cm)   BEHAVIORAL SYMPTOMS/MOOD NEUROLOGICAL BOWEL NUTRITION STATUS      Incontinent Diet (Heart Healthy)  AMBULATORY STATUS COMMUNICATION OF NEEDS Skin   Extensive Assist Verbally Normal                       Personal Care Assistance Level of Assistance  Bathing, Feeding, Dressing Bathing Assistance: Maximum assistance Feeding assistance: Independent Dressing Assistance: Maximum assistance     Functional Limitations Info  Sight, Hearing, Speech Sight Info: Adequate Hearing Info: Adequate Speech Info: Adequate    SPECIAL CARE FACTORS FREQUENCY  PT (By licensed PT), OT (By licensed OT)     PT Frequency: 5x week OT Frequency: 5x week            Contractures Contractures Info: Not present    Additional Factors Info  Code Status, Allergies Code Status Info: Full Code Allergies Info: CEPHALOSPORINS, DILAUDID HYDROMORPHONE HCL, HYDROCODONE-ACETAMINOPHEN, MORPHINE AND RELATED, TETRACYCLINE, TETRACYCLINES & RELATED, PENICILLINS, SULFA ANTIBIOTICS            Current Medications (08/26/2016):  This is the current hospital active medication list Current Facility-Administered Medications  Medication Dose Route Frequency Provider Last Rate Last Dose  . 0.9 %  sodium chloride infusion  250 mL Intravenous PRN Brand Males, MD      . atorvastatin (LIPITOR) tablet 80 mg  80 mg Oral QPM Cherene Altes, MD   80 mg at 08/26/16 1714  . buPROPion (WELLBUTRIN SR) 12 hr tablet 100 mg  100 mg Oral Daily Cherene Altes, MD      . Chlorhexidine Gluconate Cloth 2 %  PADS 6 each  6 each Topical Q0600 Brand Males, MD   6 each at 08/26/16 1000  . docusate sodium (COLACE) capsule 100 mg  100 mg Oral TID Ankit Chirag Amin, MD   100 mg at 08/26/16 1714  . furosemide (LASIX) injection 60 mg  60 mg Intravenous BID Cherene Altes, MD   60 mg at 08/26/16 1715  . gabapentin (NEURONTIN) capsule 400 mg  400 mg Oral TID Cherene Altes, MD   400 mg at 08/26/16 1714  . insulin aspart  (novoLOG) injection 0-20 Units  0-20 Units Subcutaneous TID WC Ankit Arsenio Loader, MD   3 Units at 08/26/16 1715  . insulin glargine (LANTUS) injection 14 Units  14 Units Subcutaneous QHS Cherene Altes, MD      . Derrill Memo ON 08/27/2016] levothyroxine (SYNTHROID, LEVOTHROID) tablet 100 mcg  100 mcg Oral QAC breakfast Cherene Altes, MD      . metoprolol tartrate (LOPRESSOR) tablet 12.5 mg  12.5 mg Oral BID Cherene Altes, MD   12.5 mg at 08/26/16 1008  . mometasone-formoterol (DULERA) 100-5 MCG/ACT inhaler 2 puff  2 puff Inhalation BID Cherene Altes, MD      . montelukast (SINGULAIR) tablet 10 mg  10 mg Oral QHS Brand Males, MD   10 mg at 08/25/16 2126  . mupirocin cream (BACTROBAN) 2 %   Topical Daily Brand Males, MD      . mupirocin ointment (BACTROBAN) 2 % 1 application  1 application Nasal BID Brand Males, MD   1 application at 03/40/35 0836  . oxyCODONE (Oxy IR/ROXICODONE) immediate release tablet 5 mg  5 mg Oral Q6H PRN Dellia Nims, MD   5 mg at 08/24/16 2108  . pantoprazole (PROTONIX) EC tablet 40 mg  40 mg Oral Daily Brand Males, MD   40 mg at 08/26/16 1009  . Rivaroxaban (XARELTO) tablet 15 mg  15 mg Oral BID WC Ankit Arsenio Loader, MD   15 mg at 08/26/16 1714  . [START ON 09/16/2016] rivaroxaban (XARELTO) tablet 20 mg  20 mg Oral Q supper Ankit Chirag Amin, MD      . senna (SENOKOT) tablet 17.2 mg  2 tablet Oral Daily Ankit Arsenio Loader, MD   17.2 mg at 08/26/16 2481     Discharge Medications: Please see discharge summary for a list of discharge medications.  Relevant Imaging Results:  Relevant Lab Results:   Additional Information [phone]  Wende Neighbors, LCSW

## 2016-08-26 NOTE — Clinical Social Work Note (Signed)
Clinical Social Work Assessment  Patient Details  Name: [name] MRN: [id number] Date of Birth: 08/18/1945  Date of referral:  08/26/16               Reason for consult:  Discharge Planning                Permission sought to share information with:  Family Supports Permission granted to share information::  Yes, Verbal Permission Granted  Name::     Kalif Kattner  Agency::     Relationship::  daughter  Contact Information:  [phone]  Housing/Transportation Living arrangements for the past 2 months:  Apartment Source of Information:  Patient Patient Interpreter Needed:  None Criminal Activity/Legal Involvement Pertinent to Current Situation/Hospitalization:  No - Comment as needed Significant Relationships:  Adult Children Lives with:  Self Do you feel safe going back to the place where you live?  Yes Need for family participation in patient care:  No (Coment)  Care giving concerns:  Patient has support from daughters  Social Worker assessment / plan:  CSW met patient at bedside to discuss discharge plan. Patient stated he was recently at [facility] and is agreeable to return to facility. Patient stated he was at [facility] for a month before going home for a couple of hours before re-admitted into the hospital. CSW to complete necessary paperwork and initiate SNF search on patient behalf. CSW remains available for support and to facilitate patient discharge need once medically ready.  Employment status:  Retired Forensic scientist:  Medicare PT Recommendations:  [place] / Referral to community resources:  [place]  Patient/Family's Response to care:  Patient verbalized appreciation and understanding for CSW role and involvement in care. Patient agreeable with current discharge plan to SNF  Patient/Family's Understanding of and Emotional Response to Diagnosis, Current Treatment, and Prognosis:  Patient with good  understanding of current medical state and limitations around most recent hospitalization. Patient agreeable with SNF placement.  Emotional Assessment Appearance:  Appears stated age Attitude/Demeanor/Rapport:  Other Affect (typically observed):  Pleasant, Calm Orientation:  Oriented to Situation, Oriented to  Time, Oriented to Place, Oriented to Self Alcohol / Substance use:    Psych involvement (Current and /or in the community):  No (Comment)  Discharge Needs  Concerns to be addressed:  No discharge needs identified Readmission within the last 30 days:  No Current discharge risk:  None Barriers to Discharge:  No Barriers Identified   Wende Neighbors, LCSW 08/26/2016, 7:10 PM

## 2016-08-26 NOTE — Care Management Note (Signed)
Case Management Note  Patient Details  Name: [name] MRN: [id number] Date of Birth: 07/19/1945  Subjective/Objective:       Pt transferred from [facility] with subamssive PE - for EKOS               Action/Plan:  PTA from home.  Per attending likely to discharge to SNF - PT ordered.  CM submitted benefit check for Xarelto and will provide free 30 day card if pt discharges home.  CM will continue to follow for discharge needs   Expected Discharge Date:                  Expected Discharge Plan:  [place]  In-House Referral:  Clinical Social Work  Discharge planning Services  CM Consult  Post Acute Care Choice:    Choice offered to:     DME Arranged:    DME Agency:     HH Arranged:    [place] Agency:     Status of Service:  In process, will continue to follow  If discussed at Long Length of Stay Meetings, dates discussed:    Additional Comments:  Maryclare Labrador, RN 08/26/2016, 1:32 PM

## 2016-08-26 NOTE — Progress Notes (Signed)
[place] TEAM 1 - Stepdown/ICU TEAM  MARCELLES CLINARD  JEH:[id number] DOB: [birth year] DOA: 08/23/2016 PCP: Volanda Napoleon, MD    Brief Narrative:  71 year old obese male who presented to [facility] ED from a rehab facility with a history of stroke, right lower extremity fracture, and obstructive sleep apnea with complaints of respiratory distress and hypoxia. Workup revealed submassive PE therefore he was started on heparin drip and transferred to [facility] for EKOS.   Significant Events: 4/1 admit to [facility] ICU from [facility] 4/1 TTE - mod LVH - EF 60-65% - no WMA - grade 1 DD - RV poorly visualized 4/1 EKOS in IR  4/2 venous duplex - subactue DVT R distal femoral vein, popliteal vein, and posterior tibial veins - subacute DVT L popliteal and posterior tibial   Subjective: The pt is resting comfortably.  He denies cp, n/v, abdom pain, or current sob.    Assessment & Plan:  Submassive pulmonary embolism -Likely secondary to immobility at the rehabilitation facility from his body habitus and recent R fibula fx requiring "walking boot" -EKOS complete and the catheter is out. -due to start Xarelto -Echocardiogram shows ejection fraction of 60-65%, normal wall motion, and diastolic dysfunction. Technical difficulty due to body habitus. -Ultrasound lower extremity Doppler shows subacute DVT involving right distal femoral vein, bilateral popliteal veins and bilateral posterior tibial veins.   Chronic diastolic congestive heart failure -grossly volume overloaded on exam w/ 3+ B LE edema - baseline wgt appears to be ~160kg - diurese - follow Is/Os and daily weights   Filed Weights   08/24/16 0500 08/25/16 0356 08/26/16 0500  Weight: (!) 159.7 kg (352 lb 1.2 oz) 72.6 kg (160 lb) (!) 161.5 kg (356 lb 0.7 oz)    Anemia of chronic disease No active bleeding at present - Hgb stable   Recent Labs Lab 08/23/16 2254 08/24/16 0454 08/24/16 1048 08/25/16 0621 08/26/16 0149  HGB 10.0* 9.6* 9.8*  9.5* 10.0*     CAD s/p CABG x2 asymptomatic   Obstructive sleep apnea CPAP QHS  DM2 CBG elevated - adjust tx and follow   Hypothyroidism Cont Synthroid  History of chronic pain Continue gabapentin and OxyIR  Mild hypokalemia  Replace and follow - check Mg  Morbid obesity - Body mass index is 55.76 kg/m.   MRSA screen +   DVT prophylaxis: IV heparin > Xarelto  Code Status: FULL CODE Family Communication: no family present at time of exam  Disposition Plan: transfer to tele bed - begin PT/OT - will most likely require placement in a rehab facility for long term rehab   Consultants:  PCCM IR  Antimicrobials:  none   Objective: Blood pressure (!) 112/57, pulse 86, temperature 99.1 F (37.3 C), temperature source Oral, resp. rate (!) 26, height 5\' 7"  (1.702 m), weight (!) 161.5 kg (356 lb 0.7 oz), SpO2 98 %.  Intake/Output Summary (Last 24 hours) at 08/26/16 0837 Last data filed at 08/26/16 0400  Gross per 24 hour  Intake              500 ml  Output             1000 ml  Net             -500 ml   Filed Weights   08/24/16 0500 08/25/16 0356 08/26/16 0500  Weight: (!) 159.7 kg (352 lb 1.2 oz) 72.6 kg (160 lb) (!) 161.5 kg (356 lb 0.7 oz)    Examination: General: No  acute respiratory distress Lungs: Clear to auscultation bilaterally without wheezes or crackles - distant HS Cardiovascular: Regular rate and rhythm without murmur gallop or rub normal S1 and S2 Abdomen: Nontender, morbidly obese, soft, bowel sounds positive, no rebound, no ascites, no appreciable mass Extremities: 3+ pitting B LE edema   CBC:  Recent Labs Lab 08/23/16 0303  08/23/16 2254 08/24/16 0454 08/24/16 1048 08/25/16 0621 08/26/16 0149  WBC 15.2*  < > 10.0 8.7 8.8 9.2 11.0*  NEUTROABS 13.4*  --   --   --   --   --   --   HGB 12.1*  < > 10.0* 9.6* 9.8* 9.5* 10.0*  HCT 38.4*  < > 33.8* 32.9* 33.6* 32.5* 34.5*  MCV 80.7  < > 83.3 84.1 84.6 86.2 84.4  PLT 335  < > 246 230 239 252  271  < > = values in this interval not displayed. Basic Metabolic Panel:  Recent Labs Lab 08/23/16 0303 08/23/16 1124 08/24/16 0454 08/25/16 0922  NA 134* 135 141 137  K 2.8* 2.9* 3.0* 3.4*  CL 93* 95* 103 100*  CO2 28 25 28 27   GLUCOSE 330* 194* 162* 206*  BUN 66* 50* 28* 19  CREATININE 2.07* 1.64* 1.17 1.10  CALCIUM 9.3 8.8* 8.6* 9.1  MG  --  2.8* 2.7*  --   PHOS  --  3.3 3.0  --    GFR: Estimated Creatinine Clearance: 90.9 mL/min (by C-G formula based on SCr of 1.1 mg/dL).  Liver Function Tests:  Recent Labs Lab 08/23/16 1124  AST 49*  ALT 24  ALKPHOS 101  BILITOT 0.8  PROT 7.1  ALBUMIN 3.3*    Recent Labs Lab 08/23/16 1124  LIPASE 21  AMYLASE 63    Coagulation Profile:  Recent Labs Lab 08/23/16 0543 08/23/16 1124  INR 1.12 1.22    Cardiac Enzymes:  Recent Labs Lab 08/23/16 0303 08/23/16 1124 08/23/16 1638 08/23/16 2238  TROPONINI 0.08* 0.08* 0.13* 0.10*    HbA1C: Hemoglobin A1C  Date/Time Value Ref Range Status  07/09/2011 03:01 AM 6.3 4.2 - 6.3 % Final    Comment:    The American Diabetes Association recommends that a primary goal of therapy should be <7% and that physicians should reevaluate the treatment regimen in patients with HbA1c values consistently >8%.    Hgb A1c MFr Bld  Date/Time Value Ref Range Status  08/24/2016 10:48 AM 6.8 (H) 4.8 - 5.6 % Final    Comment:    (NOTE)         Pre-diabetes: 5.7 - 6.4         Diabetes: >6.4         Glycemic control for adults with diabetes: <7.0     CBG:  Recent Labs Lab 08/25/16 0753 08/25/16 1133 08/25/16 1616 08/25/16 2357 08/26/16 0740  GLUCAP 155* 189* 174* 169* 162*    Recent Results (from the past 240 hour(s))  Blood culture (routine x 2)     Status: None (Preliminary result)   Collection Time: 08/23/16  4:03 AM  Result Value Ref Range Status   Specimen Description BLOOD LEFT ANTECUBITAL  Final   Special Requests   Final    BOTTLES DRAWN AEROBIC AND ANAEROBIC  Blood Culture adequate volume   Culture NO GROWTH 3 DAYS  Final   Report Status PENDING  Incomplete  Blood culture (routine x 2)     Status: None (Preliminary result)   Collection Time: 08/23/16  4:03 AM  Result Value  Ref Range Status   Specimen Description BLOOD LEFT ARM  Final   Special Requests   Final    BOTTLES DRAWN AEROBIC AND ANAEROBIC Blood Culture adequate volume   Culture NO GROWTH 3 DAYS  Final   Report Status PENDING  Incomplete  Urine culture     Status: Abnormal   Collection Time: 08/23/16  5:43 AM  Result Value Ref Range Status   Specimen Description URINE, RANDOM  Final   Special Requests NONE  Final   Culture MULTIPLE SPECIES PRESENT, SUGGEST RECOLLECTION (A)  Final   Report Status 08/24/2016 FINAL  Final  MRSA PCR Screening     Status: Abnormal   Collection Time: 08/23/16  9:40 AM  Result Value Ref Range Status   MRSA by PCR POSITIVE (A) NEGATIVE Final    Comment:        The GeneXpert MRSA Assay (FDA approved for NASAL specimens only), is one component of a comprehensive MRSA colonization surveillance program. It is not intended to diagnose MRSA infection nor to guide or monitor treatment for MRSA infections. RESULT CALLED TO, READ BACK BY AND VERIFIED WITH: J JAMES,RN AT 1224 08/23/16 BY L BENFIELD      Scheduled Meds: . budesonide (PULMICORT) nebulizer solution  0.25 mg Nebulization BID  . buPROPion  100 mg Oral BID  . Chlorhexidine Gluconate Cloth  6 each Topical Q0600  . docusate sodium  100 mg Oral TID  . gabapentin  400 mg Oral TID  . insulin aspart  0-20 Units Subcutaneous TID WC  . ipratropium-albuterol  3 mL Nebulization QID  . montelukast  10 mg Oral QHS  . mupirocin cream   Topical Daily  . mupirocin ointment  1 application Nasal BID  . pantoprazole  40 mg Oral Daily  . Rivaroxaban  15 mg Oral BID WC  . [START ON 09/16/2016] rivaroxaban  20 mg Oral Q supper  . senna  2 tablet Oral Daily  . sodium chloride flush  3 mL Intravenous Q12H  .  ziprasidone  80 mg Oral BID WC   Continuous Infusions: . sodium chloride Stopped (08/24/16 1125)  . sodium chloride Stopped (08/24/16 1125)  . sodium chloride Stopped (08/24/16 1125)  . sodium chloride Stopped (08/24/16 1125)     LOS: 3 days   Cherene Altes, MD [facility] Office  [phone] Pager - Text Page per Amion as per below:  On-Call/Text Page:      [email or web address]      password [email or web address]  If 7PM-7AM, please contact night-coverage [email or web address] Password [facility] 08/26/2016, 8:37 AM

## 2016-08-26 NOTE — Clinical Social Work Placement (Signed)
   CLINICAL SOCIAL WORK PLACEMENT  NOTE  Date:  08/26/2016  Patient Details  Name: [name] MRN: [id number] Date of Birth: [birth year]  Clinical Social Work is seeking post-discharge placement for this patient at the [place] level of care (*CSW will initial, date and re-position this form in  chart as items are completed):  Yes   Patient/family provided with [place] Work Department's list of facilities offering this level of care within the geographic area requested by the patient (or if unable, by the patient's family).  Yes   Patient/family informed of their freedom to choose among providers that offer the needed level of care, that participate in Medicare, Medicaid or managed care program needed by the patient, have an available bed and are willing to accept the patient.  Yes   Patient/family informed of [place]'s ownership interest in [facility] and [facility], as well as of the fact that they are under no obligation to receive care at these facilities.  PASRR submitted to EDS on       PASRR number received on       Existing PASRR number confirmed on 08/26/16     FL2 transmitted to all facilities in geographic area requested by pt/family on       FL2 transmitted to all facilities within larger geographic area on       Patient informed that his/her managed care company has contracts with or will negotiate with certain facilities, including the following:            Patient/family informed of bed offers received.  Patient chooses bed at       Physician recommends and patient chooses bed at      Patient to be transferred to   on  .  Patient to be transferred to facility by       Patient family notified on   of transfer.  Name of family member notified:        PHYSICIAN Please sign FL2     Additional Comment:    _______________________________________________ Wende Neighbors, LCSW 08/26/2016, 7:15 PM

## 2016-08-26 NOTE — Evaluation (Signed)
Physical Therapy Evaluation Patient Details Name: [name] MRN: [id number] DOB: [birth year] Today's Date: 08/26/2016   History of Present Illness  71 year old obese male with self given hx of rt eye stroke 5 years ago on plavix, and rt LE fractures 3 weeks ago and on SNF rehab, Obesity OSA on CPAP QHS with ? Compliance  Admitted 08/23/16 to [facility] ER with resp distress and hypoxemia, lacti acidosis, AKI and found to have Submassive PE.  Clinical Impression  Pt admitted with above diagnosis. Pt currently with functional limitations due to the deficits listed below (see PT Problem List). Pt limited by SOB and dec SpO2 into 80s on 2LO2 with all movement. Pt will benefit from skilled PT to increase their independence and safety with mobility to allow discharge to the venue listed below.       Follow Up Recommendations SNF    Equipment Recommendations  None recommended by PT    Recommendations for Other Services       Precautions / Restrictions Precautions Precautions: Fall Precaution Comments: obesesity with R LE fx Required Braces or Orthoses: Other Brace/Splint Other Brace/Splint: R Cam boot Restrictions Weight Bearing Restrictions: Yes RLE Weight Bearing: Weight bearing as tolerated (with cam boot)      Mobility  Bed Mobility Overal bed mobility: Needs Assistance Bed Mobility: Supine to Sit;Sit to Supine     Supine to sit: Mod assist Sit to supine: Mod assist;+2 for physical assistance   General bed mobility comments: due to body habitus, modAx2 for LE managment back into bed. modAx1 with trunk elevation and to scoot to EOB due to body habitus, labored effort. SpO2 >905 during transfers  Transfers Overall transfer level: Needs assistance Equipment used: 2 person hand held assist Transfers: Sit to/from Stand Sit to Stand: Mod assist;+2 physical assistance         General transfer comment: used rocking momentum, modA to achieve full upright  posture  Ambulation/Gait             General Gait Details: limited to 4 side steps to [facility] due to significant SOB, 3/4 on DOE scale. Pt SpO2 dec to 88% with side stepping, sat back down on EOB, returned to >90 s/p 30 sec. pt was on 2LO2 via [place] Mobility    Modified Rankin (Stroke Patients Only)       Balance Overall balance assessment: Needs assistance Sitting-balance support: Feet supported;Bilateral upper extremity supported Sitting balance-Leahy Scale: Poor Sitting balance - Comments: pt with difficulty breathing and could only tolerate 15 min at EOB prior to returning to supine. Pt placed in chair position in bed                                     Pertinent Vitals/Pain Pain Assessment: No/denies pain    Home Living Family/patient expects to be discharged to:: Skilled nursing facility                 Additional Comments: was at [facility] for rehab after R LE fracture    Prior Function Level of Independence: Needs assistance   Gait / Transfers Assistance Needed: was walking with RW with PT at SNF, short distances with chair follow  ADL's / Homemaking Assistance Needed: assist from SNF staff for dressing/bathing        Hand Dominance  Dominant Hand: Right    Extremity/Trunk Assessment   Upper Extremity Assessment Upper Extremity Assessment: Overall WFL for tasks assessed    Lower Extremity Assessment Lower Extremity Assessment: RLE deficits/detail RLE Deficits / Details: generalized weakness from recent fracture    Cervical / Trunk Assessment Cervical / Trunk Assessment: Other exceptions Cervical / Trunk Exceptions: weak due to body habitus  Communication   Communication: No difficulties  Cognition Arousal/Alertness: Awake/alert Behavior During Therapy: WFL for tasks assessed/performed Overall Cognitive Status: Within Functional Limits for tasks assessed                                         General Comments General comments (skin integrity, edema, etc.): Pt with dressing to R knee    Exercises     Assessment/Plan    PT Assessment Patient needs continued PT services  PT Problem List Decreased strength;Decreased activity tolerance;Decreased range of motion;Decreased balance;Decreased mobility;Cardiopulmonary status limiting activity;Decreased knowledge of use of DME       PT Treatment Interventions DME instruction;Gait training;Functional mobility training;Therapeutic activities;Therapeutic exercise    PT Goals (Current goals can be found in the Care Plan section)  Acute Rehab PT Goals Patient Stated Goal: go back to rehab PT Goal Formulation: With patient Time For Goal Achievement: 09/02/16 Potential to Achieve Goals: Good    Frequency Min 3X/week   Barriers to discharge        Co-evaluation               End of Session Equipment Utilized During Treatment: Oxygen (2Lo2 via [place]) Activity Tolerance: Patient tolerated treatment well Patient left: in bed;with call bell/phone within reach;with nursing/sitter in room Nurse Communication: Mobility status PT Visit Diagnosis: Muscle weakness (generalized) (M62.81)    Time: 1410-1437 PT Time Calculation (min) (ACUTE ONLY): 27 min   Charges:   PT Evaluation $PT Eval Moderate Complexity: 1 Procedure PT Treatments $Therapeutic Activity: 8-22 mins   PT G Codes:       Kittie Plater, PT, DPT Pager #: [phone] Office #: [phone]   Tifani Dack M Nasiir Monts 08/26/2016, 3:04 PM

## 2016-08-27 ENCOUNTER — Encounter (HOSPITAL_COMMUNITY): Payer: Self-pay | Admitting: Internal Medicine

## 2016-08-27 DIAGNOSIS — R7989 Other specified abnormal findings of blood chemistry: Secondary | ICD-10-CM

## 2016-08-27 DIAGNOSIS — E1169 Type 2 diabetes mellitus with other specified complication: Secondary | ICD-10-CM

## 2016-08-27 DIAGNOSIS — R778 Other specified abnormalities of plasma proteins: Secondary | ICD-10-CM | POA: Diagnosis present

## 2016-08-27 DIAGNOSIS — R748 Abnormal levels of other serum enzymes: Secondary | ICD-10-CM

## 2016-08-27 DIAGNOSIS — Z22322 Carrier or suspected carrier of Methicillin resistant Staphylococcus aureus: Secondary | ICD-10-CM

## 2016-08-27 DIAGNOSIS — I2489 Other forms of acute ischemic heart disease: Secondary | ICD-10-CM

## 2016-08-27 DIAGNOSIS — N179 Acute kidney failure, unspecified: Secondary | ICD-10-CM

## 2016-08-27 DIAGNOSIS — I82409 Acute embolism and thrombosis of unspecified deep veins of unspecified lower extremity: Secondary | ICD-10-CM | POA: Diagnosis present

## 2016-08-27 DIAGNOSIS — I248 Other forms of acute ischemic heart disease: Secondary | ICD-10-CM

## 2016-08-27 HISTORY — DX: Other forms of acute ischemic heart disease: I24.8

## 2016-08-27 HISTORY — DX: Other specified abnormal findings of blood chemistry: R79.89

## 2016-08-27 HISTORY — DX: Carrier or suspected carrier of methicillin resistant Staphylococcus aureus: Z22.322

## 2016-08-27 HISTORY — DX: Other specified abnormalities of plasma proteins: R77.8

## 2016-08-27 HISTORY — DX: Other forms of acute ischemic heart disease: I24.89

## 2016-08-27 HISTORY — DX: Acute embolism and thrombosis of unspecified deep veins of unspecified lower extremity: I82.409

## 2016-08-27 HISTORY — DX: Acute kidney failure, unspecified: N17.9

## 2016-08-27 LAB — BASIC METABOLIC PANEL
ANION GAP: 10 (ref 5–15)
BUN: 22 mg/dL — ABNORMAL HIGH (ref 6–20)
CHLORIDE: 92 mmol/L — AB (ref 101–111)
CO2: 32 mmol/L (ref 22–32)
Calcium: 9.6 mg/dL (ref 8.9–10.3)
Creatinine, Ser: 1.28 mg/dL — ABNORMAL HIGH (ref 0.61–1.24)
GFR calc Af Amer: >60 mL/min (ref >60)
GFR calc non Af Amer: 55 mL/min — ABNORMAL LOW (ref >60)
GLUCOSE: 171 mg/dL — AB (ref 65–99)
POTASSIUM: 3.7 mmol/L (ref 3.5–5.1)
Sodium: 134 mmol/L — ABNORMAL LOW (ref 135–145)

## 2016-08-27 LAB — CBC
HEMATOCRIT: 36.6 % — AB (ref 39.0–52.0)
HEMOGLOBIN: 10.9 g/dL — AB (ref 13.0–17.0)
MCH: 25 pg — ABNORMAL LOW (ref 26.0–34.0)
MCHC: 29.8 g/dL — ABNORMAL LOW (ref 30.0–36.0)
MCV: 83.9 fL (ref 78.0–100.0)
Platelets: 306 10*3/uL (ref 150–400)
RBC: 4.36 MIL/uL (ref 4.22–5.81)
RDW: 16.3 % — ABNORMAL HIGH (ref 11.5–15.5)
WBC: 12.7 10*3/uL — AB (ref 4.0–10.5)

## 2016-08-27 LAB — GLUCOSE, CAPILLARY
GLUCOSE-CAPILLARY: 223 mg/dL — AB (ref 65–99)
Glucose-Capillary: 158 mg/dL — ABNORMAL HIGH (ref 65–99)
Glucose-Capillary: 135 mg/dL — ABNORMAL HIGH (ref 65–99)

## 2016-08-27 LAB — MAGNESIUM: Magnesium: 1.9 mg/dL (ref 1.7–2.4)

## 2016-08-27 MED ORDER — RIVAROXABAN 15 MG PO TABS: 15.0000 mg | ORAL_TABLET | Freq: Two times a day (BID) | ORAL | Status: DC

## 2016-08-27 MED ORDER — ONDANSETRON HCL 4 MG/2ML IJ SOLN
4.0000 mg | Freq: Once | INTRAMUSCULAR | Status: AC
  Administered 2016-08-27: 4 mg via INTRAVENOUS
  Filled 2016-08-27: qty 2

## 2016-08-27 MED ORDER — ALBUTEROL SULFATE (2.5 MG/3ML) 0.083% IN NEBU: 2.5000 mg | INHALATION_SOLUTION | Freq: Once | RESPIRATORY_TRACT | Status: DC

## 2016-08-27 NOTE — Discharge Summary (Addendum)
Physician Discharge Summary  [name] FBP:[id number] DOB: 1946-01-10 DOA: 08/23/2016  PCP: Volanda Napoleon, MD  Admit date: 08/23/2016 Discharge date: 08/27/2016  Admitted From: Home Discharge disposition: SNF   Recommendations for Outpatient Follow-Up:   1. Needs close F/U of glycemic control. 2. Note: Plavix discontinued, may need close F/U with cardiologist to make sure this is OK. 3. Recommend close F/U of renal function.   Discharge Diagnosis:   Principal Problem:   Pulmonary embolism (HCC) Active Problems:   Diabetes (HCC)   Chronic diastolic CHF (congestive heart failure) (HCC)   Acute on chronic renal failure (HCC)   Acute on chronic respiratory failure (HCC)   Lactic acidosis   Electrolyte imbalance   Current use of proton pump inhibitor   OSA (obstructive sleep apnea)   History of bipolar disorder   History of gastric ulcer   History of asthma   History of chronic pain   Acute pulmonary embolism ([place])   2 areas of full thickness wounds right knee   AKI (acute kidney injury) ([place])   Demand ischemia (HCC)   Elevated troponin   MRSA carrier   DVT (deep venous thrombosis) ([place])    Discharge Condition: Improved.  Diet recommendation: Low sodium, heart healthy.  Carbohydrate-modified.    History of Present Illness:   [name] is an 71 y.o. male who presented to [facility] ED from a rehab facility with a history of stroke, right lower extremity fracture, and obstructive sleep apnea with complaints of respiratory distress and hypoxia. Workup revealed submassive PE therefore he was started on heparin drip and transferred to [facility] for EKOS.   Hospital Course by Problem:   Principle problem: Submassive pulmonary embolism/DVT --Likely secondary to immobility at the rehabilitation facility from his body habitus and recent R fibula fx requiring "walking boot" -EKOScomplete and the catheter is out. -Xarelto started 08/26/16. -Echocardiogram  shows ejection fraction of 60-65%, normal wall motion, and diastolic dysfunction. Technical difficulty due to body habitus. -Ultrasound lower extremity Doppler shows subacute DVT involving right distal femoral vein, bilateral popliteal veins and bilateral posterior tibial veins.   Active problems: Chronic diastolic congestive heart failure/troponin elevation/demand ischemia -Grossly volume overloaded on exam w/ 3+ B LE edema - baseline wgt appears to be ~160kg - diuresing with lasix 60 mg BID - continue to follow Is/Os and daily weights. Weight up 12 lbs, ? Accurate. Troponin elevation likely from demand ischemia related to PE and CHF. Resume pre-hospital diuretics.      Filed Weights   08/25/16 0356 08/26/16 0500 08/27/16 0705  Weight: 72.6 kg (160 lb) (!) 161.5 kg (356 lb 0.7 oz) (!) 166.9 kg (368 lb)    Anemia of chronic disease No active bleeding at present - Hgb stable.  Hemoglobin & Hematocrit  Labs (Brief)          Component Value Date/Time   HGB 10.9 (L) 08/27/2016 0217   HGB 10.2 (L) 08/21/2014 0558   HCT 36.6 (L) 08/27/2016 0217   HCT 34.3 (L) 08/21/2014 8242     CAD s/p CABG x2 Remains asymptomatic.  Obstructive sleep apnea Continue CPAP QHS.  DM2 CBGs elevated but < 180. Needs close outpatient F/U of glycemic control. CBG (last 3)   Recent Labs (last 2 labs)    Recent Labs  08/26/16 1652 08/26/16 2109 08/27/16 0701  GLUCAP 145* 171* 158*      Hypothyroidism ContinueSynthroid.  History of chronic pain Continue gabapentin.  Mild hypokalemia  Potassium and magnesium  WNL today.  Morbid obesity Body mass index is 57.64 kg/m.  AKI Creatinine elevated on admission, trended down subsequently, now rising again. GFR OK.  Monitor.  MRSA screen + Decontamination therapy ordered.       2 areas of full thickness wounds right knee: present prior to admission      1.5X1.5X.2cm and 2.2X2.8X.3cm; bith sites are dark red and  fluctuant, small amt yellow drainage, no odor.      1 area of partial thickness wound; 1.2X1.2X.1cm, pink and moist, no odor, drainage, or fluctuance.       Dressing procedure/placement/frequency: Consider wound care center referral.   Medical Consultants:    None.   Discharge Exam:   Vitals:   08/26/16 2114 08/27/16 0705  BP: (!) 125/50 (!) 126/50  Pulse: 95 88  Resp: (!) 22 20  Temp: 99.2 F (37.3 C) 98.6 F (37 C)   Vitals:   08/26/16 1745 08/26/16 1802 08/26/16 2114 08/27/16 0705  BP:  (!) 144/61 (!) 125/50 (!) 126/50  Pulse: 81 89 95 88  Resp:  20 (!) 22 20  Temp: 98.1 F (36.7 C) 98.6 F (37 C) 99.2 F (37.3 C) 98.6 F (37 C)  TempSrc: Other (Comment) Oral Oral Oral  SpO2: 96% 96% 94% 96%  Weight:    (!) 166.9 kg (368 lb)  Height:        General exam: Appears calm and comfortable. Morbidly obese. Respiratory system: Clear to auscultation but diminished throughout. Respiratory effort normal. Cardiovascular system: S1 & S2 heard, RRR. No JVD,  rubs, gallops or clicks. No murmurs. Gastrointestinal system: Abdomen is nondistended, soft and nontender. No organomegaly or masses felt. Normal bowel sounds heard. Central nervous system: Alert and oriented. No focal neurological deficits. Extremities: No clubbing,  or cyanosis. 2-3+ edema. Skin: No rashes, lesions or ulcers. Psychiatry: Judgement and insight appear normal. Mood & affect appropriate.    The results of significant diagnostics from this hospitalization (including imaging, microbiology, ancillary and laboratory) are listed below for reference.     Procedures and Diagnostic Studies:   Ct Angio Chest Pe W And/or Wo Contrast  Result Date: 08/23/2016 CLINICAL DATA:  Shortness of breath. EXAM: CT ANGIOGRAPHY CHEST WITH CONTRAST TECHNIQUE: Multidetector CT imaging of the chest was performed using the standard protocol during bolus administration of intravenous contrast. Multiplanar CT image reconstructions and  MIPs were obtained to evaluate the vascular anatomy. CONTRAST:  75 cc Isovue 370 IV COMPARISON:  Chest radiograph earlier this day.  Chest CT 03/02/2015 FINDINGS: Cardiovascular: Examination is positive for bilateral acute pulmonary emboli. Filling defects on the right involves the distal main, upper and lower lobar arteries extending into the subsegmental branches. On the left thrombus within the distal main, upper and lower lobar artery is. There is evidence of right heart strain RV to LV ratio 1.26. Thoracic aorta is normal in caliber, patient is post CABG. Common origin of the brachiocephalic and left common carotid artery Mediastinum/Nodes: Mediastinal lipomatosis. No adenopathy. Visualized thyroid gland is unremarkable. The esophagus is decompressed. Lungs/Pleura: Breathing motion artifact obscures evaluation particularly at the lung bases. Subsegmental linear atelectasis in the left lower lobe and lingula. 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 Subpleural fat on the left, no pleural effusion. No evidence pulmonary infarct. Upper Abdomen: No acute abnormality.  Right renal cyst. Musculoskeletal: There are no acute or suspicious osseous abnormalities. Degenerative change in the thoracic spine with bridging anterior osteophytes. Review of the  MIP images confirms the above findings. IMPRESSION: Positive for bilateral acute PE with moderate thromboembolic burden. There is CT evidence of right heart strain (RV/LV Ratio = 1.26) consistent with at least submassive (intermediate risk) PE. The presence of right heart strain has been associated with an increased risk of morbidity and mortality. Please activate Code PE by paging [phone]. Critical Value/emergent results were called by telephone at the time of interpretation on 08/23/2016 at 5:09 am to Dr. Nance Pear , who verbally acknowledged these results. Electronically Signed   By: Jeb Levering M.D.   On: 08/23/2016 05:10   Ir Angiogram Pulmonary Bilateral Selective  Result Date: 08/25/2016 INDICATION: Sub massive bilateral pulmonary emboli with shortness of breath and right heart strain. EXAM: 1. ULTRASOUND GUIDANCE FOR VENOUS ACCESS X2 2. BILATERAL PULMONARY ARTERIOGRAPHY 3. FLUOROSCOPIC GUIDED PLACEMENT OF BILATERAL PULMONARY ARTERIAL LYTIC INFUSION CATHETERS COMPARISON:  Chest CTA - 08/23/2016 MEDICATIONS: Intravenous Fentanyl and Versed were administered as conscious sedation during continuous monitoring of the patient's level of consciousness and physiological / cardiorespiratory status by the radiology RN, with a total moderate sedation time of 33 minutes. CONTRAST:  Isovue 5 mL FLUOROSCOPY TIME:  4 minutes 12 second (218  uGym2 DAP) COMPLICATIONS: None immediate TECHNIQUE: Informed written consent was obtained from the patient after a discussion of the risks, benefits and alternatives to treatment. Questions regarding the procedure were encouraged and answered. A timeout was performed prior to the initiation of the procedure. Ultrasound scanning was performed of the right IJ vein demonstrating adequate size and patency. As such, the right internal jugular vein was selected for vascular access. The right neck was prepped and draped in the usual sterile fashion, and a sterile drape was applied covering the operative field. Maximum barrier sterile technique with sterile gowns and gloves were used for the procedure. A timeout was performed prior to the initiation of the procedure. Local anesthesia was provided with 1% lidocaine. Under direct ultrasound guidance, the right internal jugular vein was accessed with a micro puncture sheath ultimately allowing placement of a 6 French vascular sheath. With the use of a glidewire, a 5 French angled pigtail catheter was advanced into the right main pulmonary artery. Pressure measurements were then obtained from the right pulmonary artery. Limited  right pulmonary arteriogram was performed with a hand injection. Over an exchange length Rosen wire, the pigtail catheter was exchanged for a 105/18 cm multi side-hole EKOS ultrasound assisted infusion catheter. Slightly caudal to the initial access, the right internal jugular vein was again accessed with a micropuncture sheath ultimately allowing placement of a 6French vascular sheath. With the use of a glidewire, a pigtail catheter was advanced into the left main pulmonary artery and a limited left pulmonary arteriogram was performed with a hand injection. Over an exchange length Rosen wire, the pigtail catheter was exchanged for a 105/12 cm multi side-hole EKOS ultrasound assisted infusion catheter. A postprocedural fluoroscopic image was obtained of the check demonstrating final catheter positioning. Both vascular sheath were secured at the right neck with 0 Prolene sutures. The external catheter tubing was secured at the right chest and the lytic therapy was initiated. The patient tolerated the procedure well without immediate postprocedural complication. FINDINGS: Acquired pressure measurements: Right main pulmonary artery:  51/31(40)mmHg   (normal: < 25/10) Limited pulmonary arteriograms demonstrates moderate incompletely occlusive pulmonary embolism within the distal aspect of the right and left pulmonary arteries and segmental branches, similar to that seen on previous CTA. Both the right and left pulmonary arteries  are noted to be markedly enlarged. Following the procedure, both ultrasound assisted infusion catheter tips terminate within the distal aspects of the bilateral lower lobe sub segmental pulmonary arteries. IMPRESSION: 1. Successful fluoroscopic guided initiation of bilateral ultrasound assisted catheter directed pulmonary arterial lysis for sub massive pulmonary embolism and right-sided heart strain. 2. Markedly elevated pressure measurements within the right main pulmonary artery compatible  with critical pulmonary arterial hypertension. PLAN: - repeat pressure measurements 12-24 hrs post lysis, and either removal of the catheters or continuation of the catheter directed thrombolysis. Electronically Signed   By: Lucrezia Europe M.D.   On: 08/25/2016 08:30   Ir Angiogram Selective Each Additional Vessel  Result Date: 08/25/2016 INDICATION: Sub massive bilateral pulmonary emboli with shortness of breath and right heart strain. EXAM: 1. ULTRASOUND GUIDANCE FOR VENOUS ACCESS X2 2. BILATERAL PULMONARY ARTERIOGRAPHY 3. FLUOROSCOPIC GUIDED PLACEMENT OF BILATERAL PULMONARY ARTERIAL LYTIC INFUSION CATHETERS COMPARISON:  Chest CTA - 08/23/2016 MEDICATIONS: Intravenous Fentanyl and Versed were administered as conscious sedation during continuous monitoring of the patient's level of consciousness and physiological / cardiorespiratory status by the radiology RN, with a total moderate sedation time of 33 minutes. CONTRAST:  Isovue 5 mL FLUOROSCOPY TIME:  4 minutes 12 second (218  uGym2 DAP) COMPLICATIONS: None immediate TECHNIQUE: Informed written consent was obtained from the patient after a discussion of the risks, benefits and alternatives to treatment. Questions regarding the procedure were encouraged and answered. A timeout was performed prior to the initiation of the procedure. Ultrasound scanning was performed of the right IJ vein demonstrating adequate size and patency. As such, the right internal jugular vein was selected for vascular access. The right neck was prepped and draped in the usual sterile fashion, and a sterile drape was applied covering the operative field. Maximum barrier sterile technique with sterile gowns and gloves were used for the procedure. A timeout was performed prior to the initiation of the procedure. Local anesthesia was provided with 1% lidocaine. Under direct ultrasound guidance, the right internal jugular vein was accessed with a micro puncture sheath ultimately allowing placement  of a 6 French vascular sheath. With the use of a glidewire, a 5 French angled pigtail catheter was advanced into the right main pulmonary artery. Pressure measurements were then obtained from the right pulmonary artery. Limited right pulmonary arteriogram was performed with a hand injection. Over an exchange length Rosen wire, the pigtail catheter was exchanged for a 105/18 cm multi side-hole EKOS ultrasound assisted infusion catheter. Slightly caudal to the initial access, the right internal jugular vein was again accessed with a micropuncture sheath ultimately allowing placement of a 6French vascular sheath. With the use of a glidewire, a pigtail catheter was advanced into the left main pulmonary artery and a limited left pulmonary arteriogram was performed with a hand injection. Over an exchange length Rosen wire, the pigtail catheter was exchanged for a 105/12 cm multi side-hole EKOS ultrasound assisted infusion catheter. A postprocedural fluoroscopic image was obtained of the check demonstrating final catheter positioning. Both vascular sheath were secured at the right neck with 0 Prolene sutures. The external catheter tubing was secured at the right chest and the lytic therapy was initiated. The patient tolerated the procedure well without immediate postprocedural complication. FINDINGS: Acquired pressure measurements: Right main pulmonary artery:  51/31(40)mmHg   (normal: < 25/10) Limited pulmonary arteriograms demonstrates moderate incompletely occlusive pulmonary embolism within the distal aspect of the right and left pulmonary arteries and segmental branches, similar to that seen on previous CTA. Both  the right and left pulmonary arteries are noted to be markedly enlarged. Following the procedure, both ultrasound assisted infusion catheter tips terminate within the distal aspects of the bilateral lower lobe sub segmental pulmonary arteries. IMPRESSION: 1. Successful fluoroscopic guided initiation of  bilateral ultrasound assisted catheter directed pulmonary arterial lysis for sub massive pulmonary embolism and right-sided heart strain. 2. Markedly elevated pressure measurements within the right main pulmonary artery compatible with critical pulmonary arterial hypertension. PLAN: - repeat pressure measurements 12-24 hrs post lysis, and either removal of the catheters or continuation of the catheter directed thrombolysis. Electronically Signed   By: Lucrezia Europe M.D.   On: 08/25/2016 08:30   Ir Angiogram Selective Each Additional Vessel  Result Date: 08/25/2016 INDICATION: Sub massive bilateral pulmonary emboli with shortness of breath and right heart strain. EXAM: 1. ULTRASOUND GUIDANCE FOR VENOUS ACCESS X2 2. BILATERAL PULMONARY ARTERIOGRAPHY 3. FLUOROSCOPIC GUIDED PLACEMENT OF BILATERAL PULMONARY ARTERIAL LYTIC INFUSION CATHETERS COMPARISON:  Chest CTA - 08/23/2016 MEDICATIONS: Intravenous Fentanyl and Versed were administered as conscious sedation during continuous monitoring of the patient's level of consciousness and physiological / cardiorespiratory status by the radiology RN, with a total moderate sedation time of 33 minutes. CONTRAST:  Isovue 5 mL FLUOROSCOPY TIME:  4 minutes 12 second (218  uGym2 DAP) COMPLICATIONS: None immediate TECHNIQUE: Informed written consent was obtained from the patient after a discussion of the risks, benefits and alternatives to treatment. Questions regarding the procedure were encouraged and answered. A timeout was performed prior to the initiation of the procedure. Ultrasound scanning was performed of the right IJ vein demonstrating adequate size and patency. As such, the right internal jugular vein was selected for vascular access. The right neck was prepped and draped in the usual sterile fashion, and a sterile drape was applied covering the operative field. Maximum barrier sterile technique with sterile gowns and gloves were used for the procedure. A timeout was  performed prior to the initiation of the procedure. Local anesthesia was provided with 1% lidocaine. Under direct ultrasound guidance, the right internal jugular vein was accessed with a micro puncture sheath ultimately allowing placement of a 6 French vascular sheath. With the use of a glidewire, a 5 French angled pigtail catheter was advanced into the right main pulmonary artery. Pressure measurements were then obtained from the right pulmonary artery. Limited right pulmonary arteriogram was performed with a hand injection. Over an exchange length Rosen wire, the pigtail catheter was exchanged for a 105/18 cm multi side-hole EKOS ultrasound assisted infusion catheter. Slightly caudal to the initial access, the right internal jugular vein was again accessed with a micropuncture sheath ultimately allowing placement of a 6French vascular sheath. With the use of a glidewire, a pigtail catheter was advanced into the left main pulmonary artery and a limited left pulmonary arteriogram was performed with a hand injection. Over an exchange length Rosen wire, the pigtail catheter was exchanged for a 105/12 cm multi side-hole EKOS ultrasound assisted infusion catheter. A postprocedural fluoroscopic image was obtained of the check demonstrating final catheter positioning. Both vascular sheath were secured at the right neck with 0 Prolene sutures. The external catheter tubing was secured at the right chest and the lytic therapy was initiated. The patient tolerated the procedure well without immediate postprocedural complication. FINDINGS: Acquired pressure measurements: Right main pulmonary artery:  51/31(40)mmHg   (normal: < 25/10) Limited pulmonary arteriograms demonstrates moderate incompletely occlusive pulmonary embolism within the distal aspect of the right and left pulmonary arteries and segmental branches, similar to  that seen on previous CTA. Both the right and left pulmonary arteries are noted to be markedly  enlarged. Following the procedure, both ultrasound assisted infusion catheter tips terminate within the distal aspects of the bilateral lower lobe sub segmental pulmonary arteries. IMPRESSION: 1. Successful fluoroscopic guided initiation of bilateral ultrasound assisted catheter directed pulmonary arterial lysis for sub massive pulmonary embolism and right-sided heart strain. 2. Markedly elevated pressure measurements within the right main pulmonary artery compatible with critical pulmonary arterial hypertension. PLAN: - repeat pressure measurements 12-24 hrs post lysis, and either removal of the catheters or continuation of the catheter directed thrombolysis. Electronically Signed   By: Lucrezia Europe M.D.   On: 08/25/2016 08:30   Ir US Guide Vasc Access Right  Result Date: 08/25/2016 INDICATION: Sub massive bilateral pulmonary emboli with shortness of breath and right heart strain. EXAM: 1. ULTRASOUND GUIDANCE FOR VENOUS ACCESS X2 2. BILATERAL PULMONARY ARTERIOGRAPHY 3. FLUOROSCOPIC GUIDED PLACEMENT OF BILATERAL PULMONARY ARTERIAL LYTIC INFUSION CATHETERS COMPARISON:  Chest CTA - 08/23/2016 MEDICATIONS: Intravenous Fentanyl and Versed were administered as conscious sedation during continuous monitoring of the patient's level of consciousness and physiological / cardiorespiratory status by the radiology RN, with a total moderate sedation time of 33 minutes. CONTRAST:  Isovue 5 mL FLUOROSCOPY TIME:  4 minutes 12 second (218  uGym2 DAP) COMPLICATIONS: None immediate TECHNIQUE: Informed written consent was obtained from the patient after a discussion of the risks, benefits and alternatives to treatment. Questions regarding the procedure were encouraged and answered. A timeout was performed prior to the initiation of the procedure. Ultrasound scanning was performed of the right IJ vein demonstrating adequate size and patency. As such, the right internal jugular vein was selected for vascular access. The right neck was  prepped and draped in the usual sterile fashion, and a sterile drape was applied covering the operative field. Maximum barrier sterile technique with sterile gowns and gloves were used for the procedure. A timeout was performed prior to the initiation of the procedure. Local anesthesia was provided with 1% lidocaine. Under direct ultrasound guidance, the right internal jugular vein was accessed with a micro puncture sheath ultimately allowing placement of a 6 French vascular sheath. With the use of a glidewire, a 5 French angled pigtail catheter was advanced into the right main pulmonary artery. Pressure measurements were then obtained from the right pulmonary artery. Limited right pulmonary arteriogram was performed with a hand injection. Over an exchange length Rosen wire, the pigtail catheter was exchanged for a 105/18 cm multi side-hole EKOS ultrasound assisted infusion catheter. Slightly caudal to the initial access, the right internal jugular vein was again accessed with a micropuncture sheath ultimately allowing placement of a 6French vascular sheath. With the use of a glidewire, a pigtail catheter was advanced into the left main pulmonary artery and a limited left pulmonary arteriogram was performed with a hand injection. Over an exchange length Rosen wire, the pigtail catheter was exchanged for a 105/12 cm multi side-hole EKOS ultrasound assisted infusion catheter. A postprocedural fluoroscopic image was obtained of the check demonstrating final catheter positioning. Both vascular sheath were secured at the right neck with 0 Prolene sutures. The external catheter tubing was secured at the right chest and the lytic therapy was initiated. The patient tolerated the procedure well without immediate postprocedural complication. FINDINGS: Acquired pressure measurements: Right main pulmonary artery:  51/31(40)mmHg   (normal: < 25/10) Limited pulmonary arteriograms demonstrates moderate incompletely occlusive  pulmonary embolism within the distal aspect of the right and left  pulmonary arteries and segmental branches, similar to that seen on previous CTA. Both the right and left pulmonary arteries are noted to be markedly enlarged. Following the procedure, both ultrasound assisted infusion catheter tips terminate within the distal aspects of the bilateral lower lobe sub segmental pulmonary arteries. IMPRESSION: 1. Successful fluoroscopic guided initiation of bilateral ultrasound assisted catheter directed pulmonary arterial lysis for sub massive pulmonary embolism and right-sided heart strain. 2. Markedly elevated pressure measurements within the right main pulmonary artery compatible with critical pulmonary arterial hypertension. PLAN: - repeat pressure measurements 12-24 hrs post lysis, and either removal of the catheters or continuation of the catheter directed thrombolysis. Electronically Signed   By: Lucrezia Europe M.D.   On: 08/25/2016 08:30   Dg Chest Port 1 View  Result Date: 08/24/2016 CLINICAL DATA:  71 year old male with pulmonary embolus. Lysis. Subsequent encounter. EXAM: PORTABLE CHEST 1 VIEW COMPARISON:  None. FINDINGS: Pulmonary artery catheters in place with tips projecting at the level of the right lower lobe pulmonary artery and left lower lobe pulmonary artery. No pneumothorax. Pulmonary vascular congestion. Elevated right hemidiaphragm with right base subsegmental atelectasis. Cardiomegaly. Fractured sternal wires. IMPRESSION: Pulmonary artery catheters in place at the level of the right lower lobe pulmonary artery and left lower lobe pulmonary artery. Pulmonary vascular congestion. Elevated right hemidiaphragm with right base subsegmental atelectasis. Cardiomegaly. Electronically Signed   By: Genia Del M.D.   On: 08/24/2016 10:41   Dg Chest Port 1 View  Result Date: 08/23/2016 CLINICAL DATA:  Bilateral pulmonary emboli. EXAM: PORTABLE CHEST 1 VIEW COMPARISON:  Chest radiographs and chest CTA  obtained earlier today FINDINGS: Stable mildly enlarged cardiac silhouette and post CABG changes. Stable elevation of the right hemidiaphragm. Clear lungs. Thoracic spine degenerative changes. IMPRESSION: No acute abnormality.  Stable cardiomegaly Electronically Signed   By: Claudie Revering M.D.   On: 08/23/2016 10:56   Dg Chest Portable 1 View  Result Date: 08/23/2016 CLINICAL DATA:  Acute onset of respiratory distress. Decreased O2 saturation. Initial encounter. EXAM: PORTABLE CHEST 1 VIEW COMPARISON:  Chest radiograph performed 07/13/2016 FINDINGS: The lungs are well-aerated. There is elevation of the right hemidiaphragm. Mild bibasilar atelectasis or scarring is noted. There is no evidence of pleural effusion or pneumothorax. The cardiomediastinal silhouette is mildly enlarged. The patient is status post median sternotomy. No acute osseous abnormalities are seen. IMPRESSION: Elevation of the right hemidiaphragm. Mild bibasilar atelectasis or scarring. Mild cardiomegaly. Electronically Signed   By: Garald Balding M.D.   On: 08/23/2016 03:15   Ir Infusion Thrombol Arterial Initial (ms)  Result Date: 08/25/2016 INDICATION: Sub massive bilateral pulmonary emboli with shortness of breath and right heart strain. EXAM: 1. ULTRASOUND GUIDANCE FOR VENOUS ACCESS X2 2. BILATERAL PULMONARY ARTERIOGRAPHY 3. FLUOROSCOPIC GUIDED PLACEMENT OF BILATERAL PULMONARY ARTERIAL LYTIC INFUSION CATHETERS COMPARISON:  Chest CTA - 08/23/2016 MEDICATIONS: Intravenous Fentanyl and Versed were administered as conscious sedation during continuous monitoring of the patient's level of consciousness and physiological / cardiorespiratory status by the radiology RN, with a total moderate sedation time of 33 minutes. CONTRAST:  Isovue 5 mL FLUOROSCOPY TIME:  4 minutes 12 second (218  uGym2 DAP) COMPLICATIONS: None immediate TECHNIQUE: Informed written consent was obtained from the patient after a discussion of the risks, benefits and  alternatives to treatment. Questions regarding the procedure were encouraged and answered. A timeout was performed prior to the initiation of the procedure. Ultrasound scanning was performed of the right IJ vein demonstrating adequate size and patency. As such, the right internal  jugular vein was selected for vascular access. The right neck was prepped and draped in the usual sterile fashion, and a sterile drape was applied covering the operative field. Maximum barrier sterile technique with sterile gowns and gloves were used for the procedure. A timeout was performed prior to the initiation of the procedure. Local anesthesia was provided with 1% lidocaine. Under direct ultrasound guidance, the right internal jugular vein was accessed with a micro puncture sheath ultimately allowing placement of a 6 French vascular sheath. With the use of a glidewire, a 5 French angled pigtail catheter was advanced into the right main pulmonary artery. Pressure measurements were then obtained from the right pulmonary artery. Limited right pulmonary arteriogram was performed with a hand injection. Over an exchange length Rosen wire, the pigtail catheter was exchanged for a 105/18 cm multi side-hole EKOS ultrasound assisted infusion catheter. Slightly caudal to the initial access, the right internal jugular vein was again accessed with a micropuncture sheath ultimately allowing placement of a 6French vascular sheath. With the use of a glidewire, a pigtail catheter was advanced into the left main pulmonary artery and a limited left pulmonary arteriogram was performed with a hand injection. Over an exchange length Rosen wire, the pigtail catheter was exchanged for a 105/12 cm multi side-hole EKOS ultrasound assisted infusion catheter. A postprocedural fluoroscopic image was obtained of the check demonstrating final catheter positioning. Both vascular sheath were secured at the right neck with 0 Prolene sutures. The external catheter  tubing was secured at the right chest and the lytic therapy was initiated. The patient tolerated the procedure well without immediate postprocedural complication. FINDINGS: Acquired pressure measurements: Right main pulmonary artery:  51/31(40)mmHg   (normal: < 25/10) Limited pulmonary arteriograms demonstrates moderate incompletely occlusive pulmonary embolism within the distal aspect of the right and left pulmonary arteries and segmental branches, similar to that seen on previous CTA. Both the right and left pulmonary arteries are noted to be markedly enlarged. Following the procedure, both ultrasound assisted infusion catheter tips terminate within the distal aspects of the bilateral lower lobe sub segmental pulmonary arteries. IMPRESSION: 1. Successful fluoroscopic guided initiation of bilateral ultrasound assisted catheter directed pulmonary arterial lysis for sub massive pulmonary embolism and right-sided heart strain. 2. Markedly elevated pressure measurements within the right main pulmonary artery compatible with critical pulmonary arterial hypertension. PLAN: - repeat pressure measurements 12-24 hrs post lysis, and either removal of the catheters or continuation of the catheter directed thrombolysis. Electronically Signed   By: Lucrezia Europe M.D.   On: 08/25/2016 08:30   Ir Infusion Thrombol Arterial Initial (ms)  Result Date: 08/25/2016 INDICATION: Sub massive bilateral pulmonary emboli with shortness of breath and right heart strain. EXAM: 1. ULTRASOUND GUIDANCE FOR VENOUS ACCESS X2 2. BILATERAL PULMONARY ARTERIOGRAPHY 3. FLUOROSCOPIC GUIDED PLACEMENT OF BILATERAL PULMONARY ARTERIAL LYTIC INFUSION CATHETERS COMPARISON:  Chest CTA - 08/23/2016 MEDICATIONS: Intravenous Fentanyl and Versed were administered as conscious sedation during continuous monitoring of the patient's level of consciousness and physiological / cardiorespiratory status by the radiology RN, with a total moderate sedation time of 33  minutes. CONTRAST:  Isovue 5 mL FLUOROSCOPY TIME:  4 minutes 12 second (218  uGym2 DAP) COMPLICATIONS: None immediate TECHNIQUE: Informed written consent was obtained from the patient after a discussion of the risks, benefits and alternatives to treatment. Questions regarding the procedure were encouraged and answered. A timeout was performed prior to the initiation of the procedure. Ultrasound scanning was performed of the right IJ vein demonstrating adequate size and  patency. As such, the right internal jugular vein was selected for vascular access. The right neck was prepped and draped in the usual sterile fashion, and a sterile drape was applied covering the operative field. Maximum barrier sterile technique with sterile gowns and gloves were used for the procedure. A timeout was performed prior to the initiation of the procedure. Local anesthesia was provided with 1% lidocaine. Under direct ultrasound guidance, the right internal jugular vein was accessed with a micro puncture sheath ultimately allowing placement of a 6 French vascular sheath. With the use of a glidewire, a 5 French angled pigtail catheter was advanced into the right main pulmonary artery. Pressure measurements were then obtained from the right pulmonary artery. Limited right pulmonary arteriogram was performed with a hand injection. Over an exchange length Rosen wire, the pigtail catheter was exchanged for a 105/18 cm multi side-hole EKOS ultrasound assisted infusion catheter. Slightly caudal to the initial access, the right internal jugular vein was again accessed with a micropuncture sheath ultimately allowing placement of a 6French vascular sheath. With the use of a glidewire, a pigtail catheter was advanced into the left main pulmonary artery and a limited left pulmonary arteriogram was performed with a hand injection. Over an exchange length Rosen wire, the pigtail catheter was exchanged for a 105/12 cm multi side-hole EKOS ultrasound  assisted infusion catheter. A postprocedural fluoroscopic image was obtained of the check demonstrating final catheter positioning. Both vascular sheath were secured at the right neck with 0 Prolene sutures. The external catheter tubing was secured at the right chest and the lytic therapy was initiated. The patient tolerated the procedure well without immediate postprocedural complication. FINDINGS: Acquired pressure measurements: Right main pulmonary artery:  51/31(40)mmHg   (normal: < 25/10) Limited pulmonary arteriograms demonstrates moderate incompletely occlusive pulmonary embolism within the distal aspect of the right and left pulmonary arteries and segmental branches, similar to that seen on previous CTA. Both the right and left pulmonary arteries are noted to be markedly enlarged. Following the procedure, both ultrasound assisted infusion catheter tips terminate within the distal aspects of the bilateral lower lobe sub segmental pulmonary arteries. IMPRESSION: 1. Successful fluoroscopic guided initiation of bilateral ultrasound assisted catheter directed pulmonary arterial lysis for sub massive pulmonary embolism and right-sided heart strain. 2. Markedly elevated pressure measurements within the right main pulmonary artery compatible with critical pulmonary arterial hypertension. PLAN: - repeat pressure measurements 12-24 hrs post lysis, and either removal of the catheters or continuation of the catheter directed thrombolysis. Electronically Signed   By: Lucrezia Europe M.D.   On: 08/25/2016 08:30   Ir Jacolyn Reedy F/u Elizabeth Sauer Art/ven Final Day (ms)  Result Date: 08/24/2016 INDICATION: 71 year old male with a history of pulmonary embolus. Status post 12 hours of tPA infusion for catheter directed, ultrasound accelerated lysis EXAM: BEDSIDE TRANSDUCTION OF PULMONARY CATHETERS COMPARISON:  08/23/2016, CT CHEST 08/23/2016 MEDICATIONS: None ANESTHESIA/SEDATION: None FLUOROSCOPY TIME:  None COMPLICATIONS: None TECHNIQUE:  Patient remained in the ICU, position on a bed. Sterile technique was used to transduce the indwelling catheters. Catheters were then withdrawn and sterile dressings placed after removal of the sheath. FINDINGS: Follow-up pressures:  28/21 (25) IMPRESSION: Status post bilateral pulmonary artery thrombolysis with ultrasound accelerated catheter directed tPA infusion, with follow-up demonstrating reduction of pulmonary artery pressures. Signed, Dulcy Fanny. Earleen Newport, DO [facility] Electronically Signed   By: Corrie Mckusick D.O.   On: 08/24/2016 12:27     Labs:   Basic Metabolic Panel:  Recent Labs  Lab 08/23/16 0303 08/23/16 1124 08/24/16 0454 08/25/16 0922 08/27/16 0217  NA 134* 135 141 137 134*  K 2.8* 2.9* 3.0* 3.4* 3.7  CL 93* 95* 103 100* 92*  CO2 28 25 28 27  32  GLUCOSE 330* 194* 162* 206* 171*  BUN 66* 50* 28* 19 22*  CREATININE 2.07* 1.64* 1.17 1.10 1.28*  CALCIUM 9.3 8.8* 8.6* 9.1 9.6  MG  --  2.8* 2.7*  --  1.9  PHOS  --  3.3 3.0  --   --    GFR Estimated Creatinine Clearance: 79.7 mL/min (A) (by C-G formula based on SCr of 1.28 mg/dL (H)). Liver Function Tests:  Recent Labs Lab 08/23/16 1124  AST 49*  ALT 24  ALKPHOS 101  BILITOT 0.8  PROT 7.1  ALBUMIN 3.3*    Recent Labs Lab 08/23/16 1124  LIPASE 21  AMYLASE 63   No results for input(s): AMMONIA in the last 168 hours. Coagulation profile  Recent Labs Lab 08/23/16 0543 08/23/16 1124  INR 1.12 1.22    CBC:  Recent Labs Lab 08/23/16 0303  08/24/16 0454 08/24/16 1048 08/25/16 0621 08/26/16 0149 08/27/16 0217  WBC 15.2*  < > 8.7 8.8 9.2 11.0* 12.7*  NEUTROABS 13.4*  --   --   --   --   --   --   HGB 12.1*  < > 9.6* 9.8* 9.5* 10.0* 10.9*  HCT 38.4*  < > 32.9* 33.6* 32.5* 34.5* 36.6*  MCV 80.7  < > 84.1 84.6 86.2 84.4 83.9  PLT 335  < > 230 239 252 271 306  < > = values in this interval not displayed. Cardiac Enzymes:  Recent Labs Lab  08/23/16 0303 08/23/16 1124 08/23/16 1638 08/23/16 2238  TROPONINI 0.08* 0.08* 0.13* 0.10*   BNP: Invalid input(s): POCBNP CBG:  Recent Labs Lab 08/26/16 0740 08/26/16 1150 08/26/16 1652 08/26/16 2109 08/27/16 0701  GLUCAP 162* 198* 145* 171* 158*   D-Dimer No results for input(s): DDIMER in the last 72 hours. Hgb A1c No results for input(s): HGBA1C in the last 72 hours. Lipid Profile No results for input(s): CHOL, HDL, LDLCALC, TRIG, CHOLHDL, LDLDIRECT in the last 72 hours. Thyroid function studies No results for input(s): TSH, T4TOTAL, T3FREE, THYROIDAB in the last 72 hours.  Invalid input(s): FREET3 Anemia work up No results for input(s): VITAMINB12, FOLATE, FERRITIN, TIBC, IRON, RETICCTPCT in the last 72 hours. Microbiology Recent Results (from the past 240 hour(s))  Blood culture (routine x 2)     Status: None (Preliminary result)   Collection Time: 08/23/16  4:03 AM  Result Value Ref Range Status   Specimen Description BLOOD LEFT ANTECUBITAL  Final   Special Requests   Final    BOTTLES DRAWN AEROBIC AND ANAEROBIC Blood Culture adequate volume   Culture NO GROWTH 4 DAYS  Final   Report Status PENDING  Incomplete  Blood culture (routine x 2)     Status: None (Preliminary result)   Collection Time: 08/23/16  4:03 AM  Result Value Ref Range Status   Specimen Description BLOOD LEFT ARM  Final   Special Requests   Final    BOTTLES DRAWN AEROBIC AND ANAEROBIC Blood Culture adequate volume   Culture NO GROWTH 4 DAYS  Final   Report Status PENDING  Incomplete  Urine culture     Status: Abnormal   Collection Time: 08/23/16  5:43 AM  Result Value Ref Range Status   Specimen Description URINE, RANDOM  Final   Special Requests  NONE  Final   Culture MULTIPLE SPECIES PRESENT, SUGGEST RECOLLECTION (A)  Final   Report Status 08/24/2016 FINAL  Final  MRSA PCR Screening     Status: Abnormal   Collection Time: 08/23/16  9:40 AM  Result Value Ref Range Status   MRSA by  PCR POSITIVE (A) NEGATIVE Final    Comment:        The GeneXpert MRSA Assay (FDA approved for NASAL specimens only), is one component of a comprehensive MRSA colonization surveillance program. It is not intended to diagnose MRSA infection nor to guide or monitor treatment for MRSA infections. RESULT CALLED TO, READ BACK BY AND VERIFIED WITH: J JAMES,RN AT 1224 08/23/16 BY L BENFIELD      Discharge Instructions:   Discharge Instructions    Call MD for:  difficulty breathing, headache or visual disturbances    Complete by:  As directed    Call MD for:  extreme fatigue    Complete by:  As directed    Call MD for:  persistant dizziness or light-headedness    Complete by:  As directed    Call MD for:  severe uncontrolled pain    Complete by:  As directed    Diet - low sodium heart healthy    Complete by:  As directed    Diet Carb Modified    Complete by:  As directed    Increase activity slowly    Complete by:  As directed      Allergies as of 08/27/2016      Reactions   Cephalosporins Other (See Comments)   Patient doesn't recall the reaction   Dilaudid [hydromorphone Hcl] Other (See Comments)   "CANNOT HEAR, CANNOT THINK"   Hydrocodone-acetaminophen Nausea And Vomiting   Morphine And Related Other (See Comments)   Patient was told by a doctor that this "would kill" him   Tetracycline Other (See Comments)   Patient was told by a doctor that this "would kill" him   Tetracyclines & Related Other (See Comments)   Patient doesn't recall the reaction   Penicillins Rash   Has patient had a PCN reaction causing immediate rash, facial/tongue/throat swelling, SOB or lightheadedness with hypotension: Yes Has patient had a PCN reaction causing severe rash involving mucus membranes or skin necrosis: No Has patient had a PCN reaction that required hospitalization No Has patient had a PCN reaction occurring within the last 10 years: No If all of the above answers are "NO", then may  proceed with Cephalosporin use.   Sulfa Antibiotics Rash      Medication List    STOP taking these medications   clopidogrel 75 MG tablet Commonly known as:  PLAVIX   ibuprofen 600 MG tablet Commonly known as:  ADVIL,MOTRIN   oxyCODONE 5 MG immediate release tablet Commonly known as:  ROXICODONE   traMADol 50 MG tablet Commonly known as:  ULTRAM     TAKE these medications   amLODipine 10 MG tablet Commonly known as:  NORVASC Take 10 mg by mouth daily.   aspirin EC 81 MG tablet Take 81 mg by mouth daily.   atorvastatin 80 MG tablet Commonly known as:  LIPITOR Take 80 mg by mouth every evening.   BELSOMRA 10 MG Tabs Generic drug:  Suvorexant Take 10 mg by mouth at bedtime.   buPROPion 100 MG 12 hr tablet Commonly known as:  WELLBUTRIN SR Take 100 mg by mouth daily.   celecoxib 200 MG capsule Commonly known as:  CELEBREX  Take 200 mg by mouth 2 (two) times daily.   cetirizine 10 MG tablet Commonly known as:  ZYRTEC Take 10 mg by mouth daily.   collagenase ointment Commonly known as:  SANTYL Apply 1 application topically See admin instructions. Apply to wound bed (nickel thick) daily for wound care   docusate sodium 100 MG capsule Commonly known as:  COLACE Take 100 mg by mouth 3 (three) times daily.   fluticasone 50 MCG/ACT nasal spray Commonly known as:  FLONASE Place 1 spray into both nostrils daily.   Fluticasone-Salmeterol 100-50 MCG/DOSE Aepb Commonly known as:  ADVAIR Inhale 1 puff into the lungs 2 (two) times daily.   furosemide 40 MG tablet Commonly known as:  LASIX Take 40 mg by mouth 2 (two) times daily.   gabapentin 400 MG capsule Commonly known as:  NEURONTIN Take 400 mg by mouth 3 (three) times daily.   hydrALAZINE 25 MG tablet Commonly known as:  APRESOLINE Take 25 mg by mouth 3 (three) times daily.   hydrochlorothiazide 12.5 MG capsule Commonly known as:  MICROZIDE Take 12.5 mg by mouth daily.   ipratropium-albuterol 0.5-2.5  (3) MG/3ML Soln Commonly known as:  DUONEB Inhale 3 mLs into the lungs every 4 (four) hours as needed (shortness of breath/wheezing).   iron polysaccharides 150 MG capsule Commonly known as:  NIFEREX Take 150 mg by mouth daily.   isosorbide mononitrate 60 MG 24 hr tablet Commonly known as:  IMDUR Take 60 mg by mouth daily.   levothyroxine 100 MCG tablet Commonly known as:  SYNTHROID, LEVOTHROID Take 100 mcg by mouth daily before breakfast.   magnesium oxide 400 MG tablet Commonly known as:  MAG-OX Take 400 mg by mouth daily.   meclizine 25 MG tablet Commonly known as:  ANTIVERT Take 25 mg by mouth 2 (two) times daily as needed for dizziness.   metFORMIN 500 MG tablet Commonly known as:  GLUCOPHAGE Take 500 mg by mouth 2 (two) times daily with a meal.   metolazone 5 MG tablet Commonly known as:  ZAROXOLYN Take 5 mg by mouth daily.   metoprolol 50 MG tablet Commonly known as:  LOPRESSOR Take 50 mg by mouth 2 (two) times daily.   montelukast 10 MG tablet Commonly known as:  SINGULAIR Take 1 tablet by mouth at bedtime.   nystatin powder Generic drug:  nystatin Apply topically See admin instructions. Apply to abdominal folds and groin topically twice daily   pantoprazole 40 MG tablet Commonly known as:  PROTONIX Take 40 mg by mouth daily.   PROAIR HFA 108 (90 Base) MCG/ACT inhaler Generic drug:  albuterol Inhale 2 puffs into the lungs every 4 (four) hours as needed for wheezing or shortness of breath.   Rivaroxaban 15 MG Tabs tablet Commonly known as:  XARELTO Take 1 tablet (15 mg total) by mouth 2 (two) times daily with a meal.   senna-docusate 8.6-50 MG tablet Commonly known as:  Senokot-S Take 2 tablets by mouth daily.   torsemide 10 MG tablet Commonly known as:  DEMADEX Take 10 mg by mouth daily.   Vitamin D3 50000 units Caps Take 50,000 Units by mouth every Thursday.   ziprasidone 80 MG capsule Commonly known as:  GEODON Take 80 mg by mouth 2 (two)  times daily with a meal.      Contact information for after-discharge care    [facility] Follow up.   Specialty:  [place] information: [address]  [place] [id number]               Time coordinating discharge: 35 minutes.  Signed:  Aydenn Gervin  Pager [phone] [facility] 08/27/2016, 10:52 AM

## 2016-08-27 NOTE — Care Management Note (Signed)
Case Management Note Marvetta Gibbons RN, BSN Unit 2W-Case Manager [phone]  Patient Details  Name: BOB DAVERSA MRN: [id number] Date of Birth: 29-Jul-1945  Subjective/Objective:   Pt admitted as tx from [facility] with PE for  EKOS                 Action/Plan: PTA Pt from home was recently at [facility] for rehab- recommendations from PT for [place] consulted for placement needs- per CSW plan for d/c to [facility]- as pt did not want to return to [facility].  Pt for d/c to SNF on 4/5- CSW assisting with SNF placement- Pt started on Xarelto and is a preferred med on Medicaid list.   Expected Discharge Date:  08/27/16               Expected Discharge Plan:  [place]  In-House Referral:  Clinical Social Work  Discharge planning Services  CM Consult, Medication Assistance  Post Acute Care Choice:  NA Choice offered to:  NA  DME Arranged:    DME Agency:     HH Arranged:    [place] Agency:     Status of Service:  Completed, signed off  If discussed at [facility] of Stay Meetings, dates discussed:    Discharge Disposition: skilled facility   Additional Comments:  Dawayne Patricia, RN 08/27/2016, 11:00 AM

## 2016-08-27 NOTE — Discharge Instructions (Signed)
Bleeding Precautions When on Anticoagulant Therapy WHAT IS ANTICOAGULANT THERAPY? Anticoagulant therapy is taking medicine to prevent or reduce blood clots. It is also called blood thinner therapy. Blood clots that form in your blood vessels can be dangerous. They can break loose and travel to your heart, lungs, or brain. This increases your risk of a heart attack or stroke. Anticoagulant therapy causes blood to clot more slowly. You may need anticoagulant therapy if you have:  A medical condition that increases the likelihood that blood clots will form.  A heart defect or a problem with heart rhythm. It is also a common treatment after heart surgery, such as valve replacement. WHAT ARE COMMON TYPES OF ANTICOAGULANT THERAPY? Anticoagulant medicine can be injected or taken by mouth.If you need anticoagulant therapy quickly at the hospital, the medicine may be injected under your skin or given through an IV tube. Heparin is a common example of an anticoagulant that you may get at the hospital. Most anticoagulant therapy is in the form of pills that you take at home every day. These may include:  Aspirin. This common blood thinner works by preventing blood cells (platelets) from sticking together to form a clot. Aspirin is not as strong as anticoagulants that slow down the time that it takes for your body to form a clot.  Clopidogrel. This is a newer type of drug that affects platelets. It is stronger than aspirin.  Warfarin. This is the most common anticoagulant. It changes the way your body uses vitamin K, a vitamin that helps your blood to clot. The risk of bleeding is higher with warfarin than with aspirin. You will need frequent blood tests to make sure you are taking the safest amount.  New anticoagulants. Several new drugs have been approved. They are all taken by mouth. Studies show that these drugs work as well as warfarin. They do not require blood testing. They may cause less bleeding  risk than warfarin. WHAT DO I NEED TO REMEMBER WHEN TAKING ANTICOAGULANT THERAPY? Anticoagulant therapy decreases your risk of forming a blood clot, but it increases your risk of bleeding. Work closely with your health care provider to make sure you are taking your medicine safely. These tips can help:  Learn ways to reduce your risk of bleeding.  If you are taking warfarin:  Have blood tests as ordered by your health care provider.  Do not make any sudden changes to your diet. Vitamin K in your diet can make warfarin less effective.  Do not get pregnant. This medicine may cause birth defects.  Take your medicine at the same time every day. If you forget to take your medicine, take it as soon as you remember. If you miss a whole day, do not double your dose of medicine. Take your normal dose and call your health care provider to check in.  Do not stop taking your medicine on your own.  Tell your health care provider before you start taking any new medicine, vitamin, or herbal product. Some of these could interfere with your therapy.  Tell all of your health care providers that you are on anticoagulant therapy.  Do not have surgery, medical procedures, or dental work until you tell your health care provider that you are on anticoagulant therapy. WHAT CAN AFFECT HOW ANTICOAGULANTS WORK? Certain foods, vitamins, medicines, supplements, and herbal medicines change the way that anticoagulant therapy works. They may increase or decrease the effects of your anticoagulant therapy. Either result can be dangerous for you.  Many over-the-counter medicines for pain, colds, or stomach problems interfere with anticoagulant therapy. Take these only as told by your health care provider.  Do not drink alcohol. It can interfere with your medicine and increase your risk of an injury that causes bleeding.  If you are taking warfarin, do not begin eating more foods that contain vitamin K. These include  leafy green vegetables. Ask your health care provider if you should avoid any foods. WHAT ARE SOME WAYS TO PREVENT BLEEDING? You can prevent bleeding by taking certain precautions:  Be extra careful when you use knives, scissors, or other sharp objects.  Use an electric razor instead of a blade.  Do not use toothpicks.  Use a soft toothbrush.  Wear shoes that have nonskid soles.  Use bath mats and handrails in your bathroom.  Wear gloves while you do yard work.  Wear a helmet when you ride a bike.  Wear your seat belt.  Prevent falls by removing loose rugs and extension cords from areas where you walk.  Do not play contact sports or participate in other activities that have a high risk of injury. [place] PROVIDER? Call your health care provider if:  You miss a dose of medicine:  And you are not sure what to do.  For more than one day.  You have:  Menstrual bleeding that is heavier than normal.  Blood in your urine.  A bloody nose or bleeding gums.  Easy bruising.  Blood in your stool (feces) or have black and tarry stool.  Side effects from your medicine.  You feel weak or dizzy.  You become pregnant. Seek immediate medical care if:  You have bleeding that will not stop.  You have sudden and severe headache or belly pain.  You vomit or you cough up bright red blood.  You have a severe blow to your head. WHAT ARE SOME QUESTIONS TO ASK MY HEALTH CARE PROVIDER?  What is the best anticoagulant therapy for my condition?  What side effects should I watch for?  When should I take my medicine? What should I do if I forget to take it?  Will I need to have regular blood tests?  Do I need to change my diet? Are there foods or drinks that I should avoid?  What activities are safe for me?  What should I do if I want to get pregnant? This information is not intended to replace advice given to you by your health care provider.  Make sure you discuss any questions you have with your health care provider. Document Released: 04/22/2015 Document Reviewed: 04/22/2015 Elsevier Interactive Patient Education  2017 [place] on my medicine - XARELTO (rivaroxaban)  This medication education was reviewed with me or my healthcare representative as part of my discharge preparation.    WHY WAS XARELTO PRESCRIBED FOR YOU? Xarelto was prescribed to treat blood clots that may have been found in the veins of your legs (deep vein thrombosis) or in your lungs (pulmonary embolism) and to reduce the risk of them occurring again.  What do you need to know about Xarelto? The starting dose is one 15 mg tablet taken TWICE daily with food for the FIRST 21 DAYS then on April 25th the dose is changed to one 20 mg tablet taken ONCE A DAY with your evening meal.  DO NOT stop taking Xarelto without talking to the health care provider who prescribed the medication.  Refill your prescription for  20 mg tablets before you run out.  After discharge, you should have regular check-up appointments with your healthcare provider that is prescribing your Xarelto.  In the future your dose may need to be changed if your kidney function changes by a significant amount.  What do you do if you miss a dose? If you are taking Xarelto TWICE DAILY and you miss a dose, take it as soon as you remember. You may take two 15 mg tablets (total 30 mg) at the same time then resume your regularly scheduled 15 mg twice daily the next day.  If you are taking Xarelto ONCE DAILY and you miss a dose, take it as soon as you remember on the same day then continue your regularly scheduled once daily regimen the next day. Do not take two doses of Xarelto at the same time.   Important Safety Information Xarelto is a blood thinner medicine that can cause bleeding. You should call your healthcare provider right away if you experience any of the  following: ? Bleeding from an injury or your nose that does not stop. ? Unusual colored urine (red or dark brown) or unusual colored stools (red or black). ? Unusual bruising for unknown reasons. ? A serious fall or if you hit your head (even if there is no bleeding).  Some medicines may interact with Xarelto and might increase your risk of bleeding while on Xarelto. To help avoid this, consult your healthcare provider or pharmacist prior to using any new prescription or non-prescription medications, including herbals, vitamins, non-steroidal anti-inflammatory drugs (NSAIDs) and supplements.  This website has more information on Xarelto: [email or web address]

## 2016-08-27 NOTE — Progress Notes (Addendum)
LCSW following for disposition of needs: New SNF.  Patient has a bed offer at [facility]. Call placed to [facility] in reference of discharge today in which he was agreeable.  LCSW notified MD and RN CM.   Plan is for DC. Patient's daughters are aware of plan.  Spoke with Carlis Abbott regarding plan, Not happy with bed offer and wanting different facility, however patient has this only offer.  She is aware and understands plans for SNF at [facility].   All paperwork sent to facility. Will arrange for transport after lunch 2pm. Report:  [phone]    Lane Hacker, MSW Clinical Social Work: System Wide Float Coverage for :  [phone]

## 2016-08-27 NOTE — Progress Notes (Signed)
Called report to [facility].   Arnell Sieving, RN

## 2016-08-27 NOTE — Clinical Social Work Placement (Signed)
   CLINICAL SOCIAL WORK PLACEMENT  NOTE  Date:  08/27/2016  Patient Details  Name: [name] MRN: [id number] Date of Birth: Sep 08, 1945  Clinical Social Work is seeking post-discharge placement for this patient at the [place] level of care (*CSW will initial, date and re-position this form in  chart as items are completed):  Yes   Patient/family provided with [place] Work Department's list of facilities offering this level of care within the geographic area requested by the patient (or if unable, by the patient's family).  Yes   Patient/family informed of their freedom to choose among providers that offer the needed level of care, that participate in Medicare, Medicaid or managed care program needed by the patient, have an available bed and are willing to accept the patient.  Yes   Patient/family informed of [facility]'s ownership interest in [facility] and [facility], as well as of the fact that they are under no obligation to receive care at these facilities.  PASRR submitted to EDS on       PASRR number received on       Existing PASRR number confirmed on 08/26/16     FL2 transmitted to all facilities in geographic area requested by pt/family on       FL2 transmitted to all facilities within larger geographic area on     08/27/2016   Patient informed that his/her managed care company has contracts with or will negotiate with certain facilities, including the following:            Patient/family informed of bed offers received.  08/27/2016   Patient chooses bed at      [facility]  Physician recommends and patient chooses bed at     SNF Patient to be transferred to   on  .  08/27/2016   Patient to be transferred to facility by     EMS  Patient family notified on   of transfer.  Daughter  Name of family member notified:        PHYSICIAN Please sign FL2     Additional Comment:     _______________________________________________ Lilly Cove, LCSW 08/27/2016, 11:03 AM

## 2016-08-27 NOTE — Evaluation (Signed)
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 Compliance  Admitted 08/23/16 to [facility] with resp distress and hypoxemia, lacti acidosis, AKI and found to have Submassive PE.   Clinical Impression   Pt reports he required assist from staff at SNF with ADL PTA. Currently pt requires max assist for sit to stand from EOB then min assist for side stepping x4 to [facility] for repositioning. Pt able to complete grooming task x1 with set up sitting EOB but needed max assist to don/doff CAM boot on RLE. DOE 3/4 with activity; SpO2=88% on RA. Educated pt on breathing strategies but required max cues to utilize during session. Recommend return to SNF for continued rehab prior to return home. Pt would benefit from continued skilled OT to address established goals.    Follow Up Recommendations  SNF;Supervision/Assistance - 24 hour    Equipment Recommendations  Other (comment) (TBD at next venue)    Recommendations for Other Services       Precautions / Restrictions Precautions Precautions: Fall Precaution Comments: obesesity with R LE fx Required Braces or Orthoses: Other Brace/Splint Other Brace/Splint: R Cam boot Restrictions Weight Bearing Restrictions: Yes RLE Weight Bearing: Weight bearing as tolerated (with CAM boot)      Mobility Bed Mobility Overal bed mobility: Needs Assistance Bed Mobility: Supine to Sit;Sit to Supine     Supine to sit: Min assist;HOB elevated Sit to supine: Mod assist   General bed mobility comments: Increased time required due to SOB with activity but pt able to manage LEs to EOB; min assist for trunk elevation to sitting. Mod assist for LEs back to bed and repositioning once in  supine.  Transfers Overall transfer level: Needs assistance Equipment used: Rolling walker (2 wheeled) Transfers: Sit to/from Stand Sit to Stand: Max assist         General transfer comment: Max assist to boost up from EOB x1. Cues for upright posture. Min assist for side stepping x4 to [facility].    Balance Overall balance assessment: Needs assistance Sitting-balance support: Feet supported;Single extremity supported Sitting balance-Leahy Scale: Fair     Standing balance support: Bilateral upper extremity supported Standing balance-Leahy Scale: Poor                             ADL either performed or assessed with clinical judgement   ADL Overall ADL's : Needs assistance/impaired Eating/Feeding: Set up;Bed level   Grooming: Set up;Supervision/safety;Sitting;Wash/dry face   Upper Body Bathing: Moderate assistance;Sitting   Lower Body Bathing: Maximal assistance;Sit to/from stand   Upper Body Dressing : Minimal assistance;Sitting   Lower Body Dressing: Maximal assistance;Sit to/from stand Lower Body Dressing Details (indicate cue type and reason): to don/doff CAM boot               General ADL Comments: Max assist for sit to stand from EOB x1, min assist for side stepping x4 to [facility] for repositioning. SpO2=88% on RA with activity; educated on pursed lip breathing. Pt needs constant cues for breathing strategies. DOE 3/4 with activity.     Vision         Perception     Praxis  Pertinent Vitals/Pain Pain Assessment: No/denies pain     Hand Dominance Right   Extremity/Trunk Assessment Upper Extremity Assessment Upper Extremity Assessment: Overall WFL for tasks assessed   Lower Extremity Assessment Lower Extremity Assessment: Defer to PT evaluation   Cervical / Trunk Assessment Cervical / Trunk Assessment: Other exceptions Cervical / Trunk Exceptions: weak due to body habitus   Communication Communication Communication: No difficulties    Cognition Arousal/Alertness: Awake/alert Behavior During Therapy: WFL for tasks assessed/performed Overall Cognitive Status: Within Functional Limits for tasks assessed                                     General Comments       Exercises     Shoulder Instructions      Home Living Family/patient expects to be discharged to:: Skilled nursing facility                                 Additional Comments: was at [facility] for rehab after R LE fracture      Prior Functioning/Environment Level of Independence: Needs assistance  Gait / Transfers Assistance Needed: was walking with RW with PT at SNF, short distances with chair follow ADL's / Homemaking Assistance Needed: assist from SNF staff for dressing/bathing at bed level or sitting on BSC. BSC for toileting.            OT Problem List: Decreased strength;Decreased activity tolerance;Impaired balance (sitting and/or standing);Cardiopulmonary status limiting activity;Obesity      OT Treatment/Interventions: Self-care/ADL training;Therapeutic exercise;Energy conservation;DME and/or AE instruction;Therapeutic activities;Patient/family education;Balance training    OT Goals(Current goals can be found in the care plan section) Acute Rehab OT Goals Patient Stated Goal: rehab closer to home OT Goal Formulation: With patient Time For Goal Achievement: 09/10/16 Potential to Achieve Goals: Good ADL Goals Pt Will Perform Lower Body Bathing: with min assist;sit to/from stand;with adaptive equipment Pt Will Perform Lower Body Dressing: with min assist;with adaptive equipment;sit to/from stand Pt Will Transfer to Toilet: with min guard assist;stand pivot transfer;bedside commode Pt/caregiver will Perform Home Exercise Program: Increased strength;Both right and left upper extremity;With theraband;Independently;With written HEP provided Additional ADL Goal #1: Pt will independently verbally recall  pursed lip breathing technique and utilize during ADL.  OT Frequency: Min 2X/week   Barriers to D/C:            Co-evaluation              End of Session Equipment Utilized During Treatment: Rolling walker;Other (comment) (R CAM boot)  Activity Tolerance: Patient tolerated treatment well Patient left: in bed;with call bell/phone within reach  OT Visit Diagnosis: Unsteadiness on feet (R26.81);Other abnormalities of gait and mobility (R26.89)                Time: 8032-1224 OT Time Calculation (min): 25 min Charges:  OT General Charges $OT Visit: 1 Procedure OT Evaluation $OT Eval Moderate Complexity: 1 Procedure OT Treatments $Self Care/Home Management : 8-22 mins G-Codes:     Mel Almond A. Ulice Brilliant, M.S., OTR/L Pager: [phone] 08/27/2016, 9:26 AM

## 2016-08-27 NOTE — Progress Notes (Signed)
Pt had slight wheezing after transfer to stretcher. After 5 min pt stopped wheezing. VSS 124/88  ; oxygen 94% on RA. Pt stated his breathing felt better after a few min. MD was paged. New order was given for Neb treatment but not administered because pt stopped wheezing. Report given to transport. Pt transported to [facility] with [facility].   Arnell Sieving, RN

## 2016-08-28 ENCOUNTER — Ambulatory Visit: Payer: Medicare Other | Admitting: Surgery

## 2016-08-28 LAB — CULTURE, BLOOD (ROUTINE X 2)
Culture: NO GROWTH
Culture: NO GROWTH
Special Requests: ADEQUATE
Special Requests: ADEQUATE

## 2016-08-31 ENCOUNTER — Encounter: Payer: Medicare Other | Attending: Surgery | Admitting: Surgery

## 2016-08-31 DIAGNOSIS — Z85828 Personal history of other malignant neoplasm of skin: Secondary | ICD-10-CM | POA: Diagnosis not present

## 2016-08-31 DIAGNOSIS — Z833 Family history of diabetes mellitus: Secondary | ICD-10-CM | POA: Diagnosis not present

## 2016-08-31 DIAGNOSIS — L97812 Non-pressure chronic ulcer of other part of right lower leg with fat layer exposed: Secondary | ICD-10-CM | POA: Insufficient documentation

## 2016-08-31 DIAGNOSIS — M12561 Traumatic arthropathy, right knee: Secondary | ICD-10-CM | POA: Diagnosis not present

## 2016-08-31 DIAGNOSIS — Z8249 Family history of ischemic heart disease and other diseases of the circulatory system: Secondary | ICD-10-CM | POA: Diagnosis not present

## 2016-08-31 DIAGNOSIS — K219 Gastro-esophageal reflux disease without esophagitis: Secondary | ICD-10-CM | POA: Insufficient documentation

## 2016-08-31 DIAGNOSIS — E7800 Pure hypercholesterolemia, unspecified: Secondary | ICD-10-CM | POA: Insufficient documentation

## 2016-08-31 DIAGNOSIS — Z885 Allergy status to narcotic agent status: Secondary | ICD-10-CM | POA: Insufficient documentation

## 2016-08-31 DIAGNOSIS — I509 Heart failure, unspecified: Secondary | ICD-10-CM | POA: Insufficient documentation

## 2016-08-31 DIAGNOSIS — Z87891 Personal history of nicotine dependence: Secondary | ICD-10-CM | POA: Insufficient documentation

## 2016-08-31 DIAGNOSIS — M70861 Other soft tissue disorders related to use, overuse and pressure, right lower leg: Secondary | ICD-10-CM | POA: Insufficient documentation

## 2016-08-31 DIAGNOSIS — E11622 Type 2 diabetes mellitus with other skin ulcer: Secondary | ICD-10-CM | POA: Diagnosis present

## 2016-08-31 DIAGNOSIS — I110 Hypertensive heart disease with heart failure: Secondary | ICD-10-CM | POA: Insufficient documentation

## 2016-08-31 DIAGNOSIS — Z888 Allergy status to other drugs, medicaments and biological substances status: Secondary | ICD-10-CM | POA: Diagnosis not present

## 2016-08-31 DIAGNOSIS — Z882 Allergy status to sulfonamides status: Secondary | ICD-10-CM | POA: Insufficient documentation

## 2016-08-31 DIAGNOSIS — Z951 Presence of aortocoronary bypass graft: Secondary | ICD-10-CM | POA: Diagnosis not present

## 2016-08-31 DIAGNOSIS — I2510 Atherosclerotic heart disease of native coronary artery without angina pectoris: Secondary | ICD-10-CM | POA: Insufficient documentation

## 2016-08-31 DIAGNOSIS — D649 Anemia, unspecified: Secondary | ICD-10-CM | POA: Insufficient documentation

## 2016-08-31 DIAGNOSIS — L89892 Pressure ulcer of other site, stage 2: Secondary | ICD-10-CM | POA: Insufficient documentation

## 2016-08-31 DIAGNOSIS — Z880 Allergy status to penicillin: Secondary | ICD-10-CM | POA: Insufficient documentation

## 2016-08-31 DIAGNOSIS — Z8673 Personal history of transient ischemic attack (TIA), and cerebral infarction without residual deficits: Secondary | ICD-10-CM | POA: Insufficient documentation

## 2016-08-31 DIAGNOSIS — J449 Chronic obstructive pulmonary disease, unspecified: Secondary | ICD-10-CM | POA: Diagnosis not present

## 2016-09-01 NOTE — Progress Notes (Signed)
GRAEDEN, BITNER ([id number]) Visit Report for 08/31/2016 Arrival Information Details Patient Name: TELLY, JAWAD. Date of Service: 08/31/2016 8:00 AM Medical Record Number: [id number] Patient Account Number: [id number] Date of Birth/Sex: 1945/11/03 (71 y.o. Male) Treating RN: Ahmed Prima Primary Care Cynde Menard: [name] Dupre Other Clinician: Referring Dilan Fullenwider: [name] Dupre Treating Aleenah Homen/Extender: Frann Rider in Treatment: 2 Visit Information History Since Last Visit All ordered tests and consults were completed: No Patient Arrived: Wheel Chair Added or deleted any medications: No Arrival Time: 08:06 Any new allergies or adverse reactions: No Accompanied By: self Had a fall or experienced change in No Transfer Assistance: EasyPivot activities of daily living that may affect Patient Lift risk of falls: Patient Identification Verified: Yes Signs or symptoms of abuse/neglect since last No Secondary Verification Process Yes visito Completed: Hospitalized since last visit: No Patient Requires Transmission- No Has Dressing in Place as Prescribed: Yes Based Precautions: Pain Present Now: No Patient Has Alerts: No Electronic Signature(s) Signed: 08/31/2016 4:32:54 PM By: Alric Quan Entered By: Alric Quan on 08/31/2016 08:12:49 Westerlund, [name] ([id number]) -------------------------------------------------------------------------------- Encounter Discharge Information Details Patient Name: [name] Date of Service: 08/31/2016 8:00 AM Medical Record Number: [id number] Patient Account Number: [id number] Date of Birth/Sex: Aug 03, 1945 (71 y.o. Male) Treating RN: Ahmed Prima Primary Care Taim Wurm: [name] Dupre Other Clinician: Referring Rumi Kolodziej: [name] Dupre Treating Jayten Gabbard/Extender: Frann Rider in Treatment: 2 Encounter Discharge Information Items Discharge Pain Level: 0 Discharge Condition: Stable Ambulatory  Status: Wheelchair Discharge Destination: Nursing Home Transportation: Other Accompanied By: self Schedule Follow-up Appointment: Yes Medication Reconciliation completed No and provided to Patient/Care Kyria Bumgardner: Provided on Clinical Summary of Care: 08/31/2016 Form Type Recipient Paper Patient JE Electronic Signature(s) Signed: 08/31/2016 4:32:54 PM By: Alric Quan Previous Signature: 08/31/2016 8:54:26 AM Version By: Ruthine Dose Entered By: Alric Quan on 08/31/2016 09:21:06 Woodford, [name] ([id number]) -------------------------------------------------------------------------------- Lower Extremity Assessment Details Patient Name: [name] Date of Service: 08/31/2016 8:00 AM Medical Record Number: [id number] Patient Account Number: [id number] Date of Birth/Sex: June 30, 1945 (71 y.o. Male) Treating RN: Ahmed Prima Primary Care Ellieana Dolecki: [name] Dupre Other Clinician: Referring Leslyn Monda: [name] Dupre Treating Baudelia Schroepfer/Extender: Frann Rider in Treatment: 2 Vascular Assessment Pulses: Dorsalis Pedis Palpable: [Right:Yes] Posterior Tibial Extremity colors, hair growth, and conditions: Extremity Color: [Right:Normal] Temperature of Extremity: [Right:Warm] Capillary Refill: [Right:< 3 seconds] Electronic Signature(s) Signed: 08/31/2016 4:32:54 PM By: Alric Quan Entered By: Alric Quan on 08/31/2016 08:25:36 Fassnacht, [name] ([id number]) -------------------------------------------------------------------------------- Multi Wound Chart Details Patient Name: [name] Date of Service: 08/31/2016 8:00 AM Medical Record Number: [id number] Patient Account Number: [id number] Date of Birth/Sex: [birth year] (71 y.o. 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] [2:the Lower Extremity] [3:the Lower Extremity] Comorbid History: [1:Anemia, Asthma, Arrhythmia, Congestive Arrhythmia, Congestive Arrhythmia, Congestive Heart Failure, Coronary Heart Failure, Coronary Heart Failure, Coronary Artery Disease, Hypertension, Type II Diabetes, Osteoarthritis Diabetes,  Osteoarthritis Diabetes, Osteoarthritis] [2:Anemia, Asthma, Artery Disease, Hypertension, Type II] [3:Anemia, Asthma, Artery Disease, Hypertension, Type II] Date Acquired: [1:07/23/2016] [2:07/23/2016] [3:07/23/2016] Weeks of Treatment: [1:2] [2:2] [3:2]  Wound Status: [1:Open] [2:Open] [3:Open] Measurements L x W x D 1.4x2.4x0.8 [2:1.1x1.5x0.1] [3:1x1x0.1] (cm) Area (cm) : [1:2.639] [2:1.296] [3:0.785] Volume (cm) : [1:2.111] [2:0.13] [3:0.079] % Reduction in Area: [1:66.40%] [2:58.80%] [3:86.20%] % Reduction in Volume: -168.90% [2:58.60%] [3:86.10%] Starting Position 1 12 [2:9] (o'clock): Ending Position 1 [1:12] (o'clock): Maximum Distance 1 4 [2:1] (cm): Undermining: [1:Yes] [2:Yes] [3:No] Classification: [1:Unable to visualize wound Unable to visualize wound Grade 1 bed] [2:bed] Exudate Amount: [1:Large] [2:Large] [3:Large] Exudate Type: Serosanguineous Serosanguineous Serosanguineous Exudate Color: red, brown red, brown red, brown Wound Margin: Flat and Intact Flat and Intact Distinct, outline attached Granulation Amount: Large (67-100%) Medium (34-66%) Large (67-100%) Granulation Quality: N/A Red Pink,  Pale Necrotic Amount: Small (1-33%) Medium (34-66%) Small (1-33%) Necrotic Tissue: Eschar, Adherent Slough Eschar, Adherent [place] Exposed Structures: Fat Layer (Subcutaneous Fat Layer (Subcutaneous Fat Layer (Subcutaneous Tissue) Exposed: Yes Tissue) Exposed: Yes Tissue) Exposed: Yes Fascia: No Fascia: No Fascia: No Tendon: No Tendon: No Tendon: No Muscle: No Muscle: No Muscle: No Joint: No Joint: No Joint: No Bone: No Bone: No Bone: No Epithelialization: None None None Periwound Skin Texture: Excoriation: No Scarring: Yes Excoriation: No Induration: No Excoriation: No Induration: No Callus: No Induration: No Callus: No Crepitus: No Callus: No Crepitus: No Rash: No Crepitus: No Rash: No Scarring: No Rash: No Scarring: No Periwound Skin Maceration: No Maceration: No Maceration: No Moisture: Dry/Scaly: No Dry/Scaly: No Dry/Scaly: No Periwound Skin Color: Erythema: Yes Erythema: Yes Erythema: Yes Atrophie Blanche: No Atrophie Blanche: No Atrophie Blanche: No Cyanosis: No Cyanosis: No Cyanosis: No Ecchymosis: No Ecchymosis: No Ecchymosis: No Hemosiderin Staining: No Hemosiderin Staining: No Hemosiderin Staining: No Mottled: No Mottled: No Mottled: No Pallor: No Pallor: No Pallor: No Rubor: No Rubor: No Rubor: No Erythema Location: Circumferential Circumferential Circumferential Temperature: No Abnormality No Abnormality No Abnormality Tenderness on Yes Yes Yes Palpation: Wound Preparation: Ulcer Cleansing: Ulcer Cleansing: Ulcer Cleansing: Rinsed/Irrigated with Rinsed/Irrigated with Rinsed/Irrigated with Saline Saline Saline Topical Anesthetic Topical Anesthetic Topical Anesthetic Applied: Other: Lidocaine Applied: Other: Lidocaine Applied: Other: lidocain 4% 4% 4% Treatment Notes Electronic Signature(s) Signed: 08/31/2016 9:13:46 AM By: Christin Fudge MD, FACS Entered By: Christin Fudge on 08/31/2016 09:13:46 Wehrman, [name] ([id number]) [name] ([id number]) -------------------------------------------------------------------------------- [place] Details Patient Name: [name] Date of Service: 08/31/2016 8:00 AM Medical Record Number: [id number] Patient Account Number: [id number] Date of Birth/Sex: 05-24-1946 (72 y.o. Male) Treating RN: Carolyne Fiscal, Debi Primary Care Gwendolen Hewlett: [name] Dupre Other Clinician: Referring Artavious Trebilcock: [name] Dupre Treating Deontrey Massi/Extender: Frann Rider in Treatment: 2 Active Inactive ` Orientation to the Wound Care Program Nursing Diagnoses: Knowledge deficit related to the wound healing center program Goals: Patient/caregiver will verbalize understanding of the [place] Program Date Initiated: 08/14/2016 Target Resolution Date: 12/14/2016 Goal Status: Active Interventions: Provide education on orientation to the wound center Notes: ` Pressure Nursing Diagnoses: Knowledge deficit related to causes and risk factors for pressure ulcer development Knowledge deficit related to management of pressures ulcers Potential for impaired tissue integrity related to pressure, friction, moisture, and shear Goals: Patient will remain free from development of additional pressure ulcers Date Initiated: 08/14/2016 Target Resolution Date: 12/14/2016 Goal Status: Active Patient will remain free of pressure ulcers Date Initiated: 08/14/2016 Target Resolution Date: 12/14/2016 Goal Status: Active Patient/caregiver will verbalize risk factors for pressure ulcer development Date Initiated: 08/14/2016 Target Resolution Date: 12/14/2016 Goal Status: Active Patient/caregiver will verbalize understanding of pressure ulcer management Date Initiated: 08/14/2016 Target Resolution Date: 12/14/2016 Goal Status: Active  HEATHER, STREEPER ([id number]) Interventions: Assess: immobility, friction, shearing, incontinence upon admission and as  needed Assess offloading mechanisms upon admission and as needed Assess potential for pressure ulcer upon admission and as needed Provide education on pressure ulcers Treatment Activities: Patient referred for seating evaluation to ensure proper offloading : 08/14/2016 Pressure reduction/relief device ordered : 08/14/2016 Notes: ` Wound/Skin Impairment Nursing Diagnoses: Impaired tissue integrity Knowledge deficit related to ulceration/compromised skin integrity Goals: Patient/caregiver will verbalize understanding of skin care regimen Date Initiated: 08/14/2016 Target Resolution Date: 12/14/2016 Goal Status: Active Ulcer/skin breakdown will have a volume reduction of 30% by week 4 Date Initiated: 08/14/2016 Target Resolution Date: 12/14/2016 Goal Status: Active Ulcer/skin breakdown will have a volume reduction of 50% by week 8 Date Initiated: 08/14/2016 Target Resolution Date: 12/14/2016 Goal Status: Active Ulcer/skin breakdown will have a volume reduction of 80% by week 12 Date Initiated: 08/14/2016 Target Resolution Date: 12/14/2016 Goal Status: Active Ulcer/skin breakdown will heal within 14 weeks Date Initiated: 08/14/2016 Target Resolution Date: 12/14/2016 Goal Status: Active Interventions: Assess patient/caregiver ability to obtain necessary supplies Assess patient/caregiver ability to perform ulcer/skin care regimen upon admission and as needed Assess ulceration(s) every visit Provide education on ulcer and skin care Treatment Activities: Referred to DME Giamarie Bueche for dressing supplies : 08/14/2016 Skin care regimen initiated : 08/14/2016 TAJON, MORING ([id number]) Topical wound management initiated : 08/14/2016 Notes: Electronic Signature(s) Signed: 08/31/2016 4:32:54 PM By: Alric Quan Entered By: Alric Quan on 08/31/2016 08:25:40 Binette, [name] ([id number]) -------------------------------------------------------------------------------- Pain Assessment  Details Patient Name: [name] Date of Service: 08/31/2016 8:00 AM Medical Record Number: [id number] Patient Account Number: [id number] Date of Birth/Sex: 02-08-1946 (71 y.o. Male) Treating RN: Ahmed Prima Primary Care Samanthajo Payano: [name] Dupre Other Clinician: Referring Jmari Pelc: [name] Dupre Treating Laisha Rau/Extender: Frann Rider in Treatment: 2 Active Problems Location of Pain Severity and Description of Pain Patient Has Paino No Site Locations With Dressing Change: No Pain Management and Medication Current Pain Management: Electronic Signature(s) Signed: 08/31/2016 4:32:54 PM By: Alric Quan Entered By: Alric Quan on 08/31/2016 08:12:55 Nierenberg, [name] ([id number]) -------------------------------------------------------------------------------- Patient/Caregiver Education Details Patient Name: [name] Date of Service: 08/31/2016 8:00 AM Medical Record Number: [id number] Patient Account Number: [id number] Date of Birth/Gender: May 21, 1946 (71 y.o. Male) Treating RN: Ahmed Prima Primary Care Physician: [name] Dupre Other Clinician: Referring Physician: [name] Dupre Treating Physician/Extender: Frann Rider in Treatment: 2 Education Assessment Education Provided To: Patient Education Topics Provided Wound/Skin Impairment: Handouts: Other: change dressing as ordered Methods: Demonstration, Explain/Verbal Responses: State content correctly Electronic Signature(s) Signed: 08/31/2016 4:32:54 PM By: Alric Quan Entered By: Alric Quan on 08/31/2016 08:28:25 Klebba, [name] ([id number]) -------------------------------------------------------------------------------- Wound Assessment Details Patient Name: Aber, Daltyn T. Date of Service: 08/31/2016 8:00 AM Medical Record Number: [id number] Patient Account Number: [id number] Date of Birth/Sex: [birth year] (71 y.o. Male) Treating RN: Ahmed Prima Primary Care Datha Kissinger: [name] Dupre Other Clinician: Referring Bari Handshoe: [name] Dupre Treating Jr Milliron/Extender: Frann Rider in Treatment: 2 Wound Status Wound Number: 1 Primary Diabetic Wound/Ulcer of the Lower Etiology: Extremity Wound Location: Right Knee - Anterior Wound Open Wounding Event: Pressure Injury Status: Date Acquired: 07/23/2016 Comorbid Anemia, Asthma, Arrhythmia, Weeks Of Treatment: 2 History: Congestive Heart Failure, Coronary Clustered Wound: No Artery Disease, Hypertension, Type II Diabetes, Osteoarthritis Photos Photo Uploaded By: Alric Quan on 08/31/2016 11:38:50 Wound Measurements Length: (cm) 1.4 % Reduction in Width: (cm) 2.4 % Reduction in Depth: (cm) 0.8 Epithelializati Area: (cm) 2.639 Undermining: Volume: (cm) 2.111 Starting Po Ending Posit Maximum Dist  Area: 66.4% Volume: -168.9% on: None Yes sition (o'clock): 12 ion (o'clock): 12 ance: (cm) 4 Wound Description Classification: Unable to visualize wound bed Wound Margin: Flat and Intact Exudate Amount: Large Exudate Type: Serosanguineous Exudate Color: red, brown Foul Odor After Cleansing: No Slough/Fibrino Yes Wound Bed Schwebke, Sylas T. ([id number]) Granulation Amount: Large (67-100%) Exposed Structure Necrotic Amount: Small (1-33%) Fascia Exposed: No Necrotic Quality: Eschar, Adherent Slough Fat Layer (Subcutaneous Tissue) Exposed: Yes Tendon Exposed: No Muscle Exposed: No Joint Exposed: No Bone Exposed: No Periwound Skin Texture Texture Color No Abnormalities Noted: No No Abnormalities Noted: No Callus: No Atrophie Blanche: No Crepitus: No Cyanosis: No Excoriation: No Ecchymosis: No Induration: No Erythema: Yes Rash: No Erythema Location: Circumferential Scarring: No Hemosiderin Staining: No Mottled: No Moisture Pallor: No No Abnormalities Noted: No Rubor: No Dry / Scaly: No Maceration: No Temperature / Pain Temperature: No  Abnormality Tenderness on Palpation: Yes Wound Preparation Ulcer Cleansing: Rinsed/Irrigated with Saline Topical Anesthetic Applied: Other: Lidocaine 4%, Treatment Notes Wound #1 (Right, Anterior Knee) 1. Cleansed with: Clean wound with Normal Saline 2. Anesthetic Topical Lidocaine 4% cream to wound bed prior to debridement 3. Peri-wound Care: Skin Prep 4. Dressing Applied: Santyl Ointment Plain packing gauze 5. Secondary Dressing Applied ABD Pad Dry Gauze 7. Secured with Recruitment consultant) Signed: 08/31/2016 4:32:54 PM By: Marvis Repress ([id number]) Entered By: Alric Quan on 08/31/2016 08:20:45 [name] ([id number]) -------------------------------------------------------------------------------- Wound Assessment Details Patient Name: Hollar, Adarrius T. Date of Service: 08/31/2016 8:00 AM Medical Record Number: [id number] Patient Account Number: [id number] Date of Birth/Sex: 27-Oct-1945 (71 y.o. Male) Treating RN: Ahmed Prima Primary Care Emireth Cockerham: [name] Dupre Other Clinician: Referring Maribelle Hopple: [name] Dupre Treating Chrisoula Zegarra/Extender: Frann Rider in Treatment: 2 Wound Status Wound Number: 2 Primary Diabetic Wound/Ulcer of the Lower Etiology: Extremity Wound Location: Right Knee - Medial Wound Open Wounding Event: Pressure Injury Status: Date Acquired: 07/23/2016 Comorbid Anemia, Asthma, Arrhythmia, Weeks Of Treatment: 2 History: Congestive Heart Failure, Coronary Clustered Wound: No Artery Disease, Hypertension, Type II Diabetes, Osteoarthritis Photos Wound Measurements Length: (cm) 1.1 % Reduction in Width: (cm) 1.5 % Reduction in Depth: (cm) 0.1 Epithelializati Area: (cm) 1.296 Tunneling: Volume: (cm) 0.13 Undermining: Starting Pos Maximum Dist Area: 58.8% Volume: 58.6% on: None No Yes ition (o'clock): 9 ance: (cm) 1 Wound Description Classification: Unable to visualize wound bed  Foul Odor After Wound Margin: Flat and Intact Slough/Fibrino Exudate Amount: Large Exudate Type: Serosanguineous Exudate Color: red, brown Cleansing: No Yes Wound Bed Granulation Amount: Medium (34-66%) Exposed Structure Olenik, Vaiden T. ([id number]) Granulation Quality: Red Fascia Exposed: No Necrotic Amount: Medium (34-66%) Fat Layer (Subcutaneous Tissue) Exposed: Yes Necrotic Quality: Eschar, Adherent Slough Tendon Exposed: No Muscle Exposed: No Joint Exposed: No Bone Exposed: No Periwound Skin Texture Texture Color No Abnormalities Noted: No No Abnormalities Noted: No Callus: No Atrophie Blanche: No Crepitus: No Cyanosis: No Excoriation: No Ecchymosis: No Induration: No Erythema: Yes Rash: No Erythema Location: Circumferential Scarring: Yes Hemosiderin Staining: No Mottled: No Moisture Pallor: No No Abnormalities Noted: No Rubor: No Dry / Scaly: No Maceration: No Temperature / Pain Temperature: No Abnormality Tenderness on Palpation: Yes Wound Preparation Ulcer Cleansing: Rinsed/Irrigated with Saline Topical Anesthetic Applied: Other: Lidocaine 4%, Assessment Notes The picture label states lateral but that is a mistake it is the medial. Treatment Notes Wound #2 (Right, Medial Knee) 1. Cleansed with: Clean wound with Normal Saline 2. Anesthetic Topical Lidocaine 4% cream to wound bed prior to debridement 3. Peri-wound Care: Skin Prep 4.  Dressing Applied: Santyl Ointment 5. Secondary Dressing Applied ABD Pad Dry Gauze 7. Secured with [employer]) VIKAS, WEGMANN ([id number]) Signed: 08/31/2016 4:32:54 PM By: Alric Quan Entered By: Alric Quan on 08/31/2016 11:39:59 Chick, [name] ([id number]) -------------------------------------------------------------------------------- Wound Assessment Details Patient Name: Zilka, Amiel T. Date of Service: 08/31/2016 8:00 AM Medical Record Number: [id number] Patient Account  Number: [id number] Date of Birth/Sex: [birth year] (71 y.o. Male) Treating RN: Ahmed Prima Primary Care Alle Difabio: [name] Dupre Other Clinician: Referring Alantis Bethune: [name] Dupre Treating Rosellen Lichtenberger/Extender: Frann Rider in Treatment: 2 Wound Status Wound Number: 3 Primary Diabetic Wound/Ulcer of the Lower Etiology: Extremity Wound Location: Right Knee - Lateral Wound Open Wounding Event: Pressure Injury Status: Date Acquired: 07/23/2016 Comorbid Anemia, Asthma, Arrhythmia, Weeks Of Treatment: 2 History: Congestive Heart Failure, Coronary Clustered Wound: No Artery Disease, Hypertension, Type II Diabetes, Osteoarthritis Photos Wound Measurements Length: (cm) 1 Width: (cm) 1 Depth: (cm) 0.1 Area: (cm) 0.785 Volume: (cm) 0.079 % Reduction in Area: 86.2% % Reduction in Volume: 86.1% Epithelialization: None Tunneling: No Undermining: No Wound Description Classification: Grade 1 Foul Odor Aft Wound Margin: Distinct, outline attached Slough/Fibrin Exudate Amount: Large Exudate Type: Serosanguineous Exudate Color: red, brown er Cleansing: No o Yes Wound Bed Granulation Amount: Large (67-100%) Exposed Structure Granulation Quality: Pink, Pale Fascia Exposed: No Necrotic Amount: Small (1-33%) Fat Layer (Subcutaneous Tissue) Exposed: Yes Necrotic Quality: Adherent Slough Tendon Exposed: No Waln, Doroteo T. ([id number]) Muscle Exposed: No Joint Exposed: No Bone Exposed: No Periwound Skin Texture Texture Color No Abnormalities Noted: No No Abnormalities Noted: No Callus: No Atrophie Blanche: No Crepitus: No Cyanosis: No Excoriation: No Ecchymosis: No Induration: No Erythema: Yes Rash: No Erythema Location: Circumferential Scarring: No Hemosiderin Staining: No Mottled: No Moisture Pallor: No No Abnormalities Noted: No Rubor: No Dry / Scaly: No Maceration: No Temperature / Pain Temperature: No Abnormality Tenderness on Palpation:  Yes Wound Preparation Ulcer Cleansing: Rinsed/Irrigated with Saline Topical Anesthetic Applied: Other: lidocain 4%, Assessment Notes The picture label states medial but that is a mistake it is the lateral. Treatment Notes Wound #3 (Right, Lateral Knee) 1. Cleansed with: Clean wound with Normal Saline 2. Anesthetic Topical Lidocaine 4% cream to wound bed prior to debridement 3. Peri-wound Care: Skin Prep 4. Dressing Applied: Santyl Ointment Plain packing gauze 5. Secondary Dressing Applied ABD Pad Dry Gauze 7. Secured with Recruitment consultant) Signed: 08/31/2016 4:32:54 PM By: Alric Quan Entered By: Alric Quan on 08/31/2016 11:40:32 Dauphinais, [name] ([id number]) Bostock, [name] ([id number]) -------------------------------------------------------------------------------- [place] Details Patient Name: [name] Date of Service: 08/31/2016 8:00 AM Medical Record Number: [id number] Patient Account Number: [id number] Date of Birth/Sex: 25-Jul-1945 (71 y.o. Male) Treating RN: Ahmed Prima Primary Care Veera Stapleton: [name] Dupre Other Clinician: Referring Frankie Zito: [name] Dupre Treating Daichi Moris/Extender: Frann Rider in Treatment: 2 Vital Signs Time Taken: 08:12 Temperature (F): 98.5 Height (in): 67 Pulse (bpm): 78 Weight (lbs): 357 Respiratory Rate (breaths/min): 18 Body Mass Index (BMI): 55.9 Blood Pressure (mmHg): 154/59 Reference Range: 80 - 120 mg / dl Electronic Signature(s) Signed: 08/31/2016 4:32:54 PM By: Alric Quan Entered By: Alric Quan on 08/31/2016 08:13:39

## 2016-09-01 NOTE — Progress Notes (Signed)
[name] [name] ([id number]) Visit Report for 08/31/2016 Chief Complaint Document Details Patient Name: [name] [name] MONICA. Date of Service: 08/31/2016 8:00 AM Medical Record Number: [id number] Patient Account Number: [id number] Date of Birth/Sex: [birth year] (71 y.o. Male) Treating RN: Ahmed Prima Primary Care Provider: [name] Dupre Other Clinician: Referring Provider: [name] Dupre Treating Provider/Extender: Frann Rider in Treatment: 2 Information Obtained from: Patient Chief Complaint Patient is at the clinic for treatment of an open pressure ulcer to the area of the right knee for about 3 weeks now Electronic Signature(s) Signed: 08/31/2016 9:17:17 AM By: Christin Fudge MD, FACS Entered By: Christin Fudge on 08/31/2016 09:17:16 [name] [name] [name] ([id number]) -------------------------------------------------------------------------------- Debridement Details Patient Name: [name] [name]. Date of Service: 08/31/2016 8:00 AM Medical Record Number: [id number] Patient Account Number: [id number] Date of Birth/Sex: Aug 11, 1945 (71 y.o. Male) Treating RN: Ahmed Prima Primary Care Provider: [name] Dupre Other Clinician: Referring Provider: [name] Dupre Treating Provider/Extender: Frann Rider in Treatment: 2 Debridement Performed for Wound #1 Right,Anterior Knee Assessment: Performed By: Physician Christin Fudge, MD Debridement: Debridement Pre-procedure Yes - 08:30 Verification/Time Out Taken: Start Time: 08:30 Pain Control: Lidocaine 4% Topical Solution Level: Skin/Subcutaneous Tissue Total Area Debrided (L x 1.4 (cm) x 2.4 (cm) = 3.36 (cm) W): Tissue and other Viable, Non-Viable, Fibrin/Slough, Subcutaneous material debrided: Instrument: Forceps Bleeding: None End Time: 08:33 Procedural Pain: 0 Post Procedural Pain: 0 Response to Treatment: Procedure was tolerated well Post Debridement Measurements of Total Wound Length: (cm) 1.4 Width:  (cm) 2.4 Depth: (cm) 0.8 Volume: (cm) 2.111 Character of Wound/Ulcer Post Requires Further Debridement Debridement: Severity of Tissue Post Debridement: Fat layer exposed Post Procedure Diagnosis Same as Pre-procedure Electronic Signature(s) Signed: 08/31/2016 9:15:23 AM By: Christin Fudge MD, FACS Signed: 08/31/2016 4:32:54 PM By: Alric Quan Entered By: Christin Fudge on 08/31/2016 09:15:23 [name] [name] ([id number]) -------------------------------------------------------------------------------- Debridement Details Patient Name: [name] [name]. Date of Service: 08/31/2016 8:00 AM Medical Record Number: [id number] Patient Account Number: [id number] Date of Birth/Sex: December 26, 1945 (71 y.o. Male) Treating RN: Ahmed Prima Primary Care Provider: [name] Dupre Other Clinician: Referring Provider: [name] Dupre Treating Provider/Extender: Frann Rider in Treatment: 2 Debridement Performed for Wound #2 Right,Medial Knee Assessment: Performed By: Physician Christin Fudge, MD Debridement: Debridement Pre-procedure Yes - 08:30 Verification/Time Out Taken: Start Time: 08:30 Pain Control: Lidocaine 4% Topical Solution Level: Skin/Subcutaneous Tissue Total Area Debrided (L x 1.1 (cm) x 1.5 (cm) = 1.65 (cm) W): Tissue and other Viable, Non-Viable, Fibrin/Slough, Subcutaneous material debrided: Instrument: Forceps Bleeding: None End Time: 08:33 Procedural Pain: 0 Post Procedural Pain: 0 Response to Treatment: Procedure was tolerated well Post Debridement Measurements of Total Wound Length: (cm) 1.1 Width: (cm) 1.5 Depth: (cm) 0.3 Volume: (cm) 0.389 Character of Wound/Ulcer Post Requires Further Debridement Debridement: Severity of Tissue Post Debridement: Fat layer exposed Post Procedure Diagnosis Same as Pre-procedure Electronic Signature(s) Signed: 08/31/2016 9:16:42 AM By: Christin Fudge MD, FACS Signed: 08/31/2016 4:32:54 PM By: Alric Quan Entered By: Christin Fudge on 08/31/2016 09:16:42 [name] [name] [name] ([id number]) -------------------------------------------------------------------------------- Debridement Details Patient Name: [name] [name]. Date of Service: 08/31/2016 8:00 AM Medical Record Number: [id number] Patient Account Number: [id number] Date of Birth/Sex: 09-Jan-1946 (71 y.o. Male) Treating RN: Ahmed Prima Primary Care Provider: [name] Dupre Other Clinician: Referring Provider: [name] Dupre Treating Provider/Extender: Frann Rider in Treatment: 2 Debridement Performed for Wound #3 Right,Lateral Knee Assessment: Performed By: Physician Christin Fudge, MD Debridement: Debridement Pre-procedure Yes - 08:30 Verification/Time Out Taken: Start Time: 08:30 Pain Control: Lidocaine 4% Topical Solution Level:  Skin/Subcutaneous Tissue Total Area Debrided (L x 1 (cm) x 1 (cm) = 1 (cm) W): Tissue and other Viable, Non-Viable, Fibrin/Slough, Subcutaneous material debrided: Instrument: Forceps Bleeding: None End Time: 08:33 Procedural Pain: 0 Post Procedural Pain: 0 Response to Treatment: Procedure was tolerated well Post Debridement Measurements of Total Wound Length: (cm) 1 Width: (cm) 1 Depth: (cm) 0.3 Volume: (cm) 0.236 Character of Wound/Ulcer Post Requires Further Debridement Debridement: Severity of Tissue Post Debridement: Fat layer exposed Post Procedure Diagnosis Same as Pre-procedure Electronic Signature(s) Signed: 08/31/2016 9:17:09 AM By: Christin Fudge MD, FACS Signed: 08/31/2016 4:32:54 PM By: Alric Quan Entered By: Christin Fudge on 08/31/2016 09:17:09 [name] [name] [name] ([id number]) -------------------------------------------------------------------------------- HPI Details Patient Name: [name] [name]. Date of Service: 08/31/2016 8:00 AM Medical Record Number: [id number] Patient Account Number: [id number] Date of Birth/Sex: [birth year] (71 y.o. Male) Treating  RN: Ahmed Prima Primary Care Provider: [name] Dupre Other Clinician: Referring Provider: [name] Dupre Treating Provider/Extender: Frann Rider in Treatment: 2 History of Present Illness Location: right knee swelling with skin ulceration Quality: Patient reports experiencing a dull pain to affected area(s). Severity: Patient states wound are getting worse. Duration: Patient has had the wound for <4 weeks prior to presenting for treatment Timing: Pain in wound is Intermittent (comes and goes Context: The wound occurred when the patient had a cast placed over his right lower extremity and with the cast was removed he had a swelling of the knee with problems with his skin Modifying Factors: Other treatment(s) tried include:orthopedics Associated Signs and Symptoms: swelling of both lower extremities with large amount of swelling of the knee HPI Description: 71 year old gentleman who has been living in an independent living facility for long-term care has had recurrent falls and recently had a large hematoma on the right knee. Known to be on aspirin and Plavix for history of CABG and was seen in the ER at the end of February after a fall. past medical history of arthritis, congestive heart failure, diabetes mellitus, hypertension, status post CABG o2, status post cholecystectomy, hernia repair and kidney surgery. recent workup for the fall showed negative clinical findings in his extremities and it was thought that he was falling due to deconditioning. a x-ray showed a right fibular neck fracture and was placed in a splint. he was asked to be seen by orthopedic surgeon. the patient cannot tell me which orthopedic doctor had seen him and I'm not able to get any notes from [place], regarding his orthopedic care. The patient says twice during this last week he has had bloody fluid shooting out of his knee and has drained all over his boot and the flow. 08/21/2016 -- he was seen  by the nurse practitioner at the facility and and distend she aspirated about 60 mL of hemorrhagic fluid from his right knee. 08/31/2016 -- since I saw the patient last he was admitted to the hospital at [facility] between 08/23/2016 2 08/27/2016 and was treated with a pulmonary embolism. he had EKOS complete, was started on Xarelto, and also treated for chronic diastolic congestive heart failure. he was asked to follow-up with the wound center and continue management locally Electronic Signature(s) Signed: 08/31/2016 9:20:49 AM By: Christin Fudge MD, FACS Entered By: Christin Fudge on 08/31/2016 09:20:48 [name] [name] ([id number]) -------------------------------------------------------------------------------- Physical Exam Details Patient Name: [name] [name]. Date of Service: 08/31/2016 8:00 AM Medical Record Number: [id number] Patient Account Number: [id number] Date of Birth/Sex: 05-16-1946 (71 y.o. Male) Treating RN: Ahmed Prima Primary Care Provider: [name] Dupre  Other Clinician: Referring Provider: [name] Dupre Treating Provider/Extender: Frann Rider in Treatment: 2 Constitutional . Pulse regular. Respirations normal and unlabored. Afebrile. . Eyes Nonicteric. Reactive to light. Ears, Nose, Mouth, and Throat Lips, teeth, and gums WNL.[name] Moist mucosa without lesions. Neck supple and nontender. No palpable supraclavicular or cervical adenopathy. Normal sized without goiter. Respiratory WNL. No retractions.. Breath sounds WNL, No rubs, rales, rhonchi, or wheeze.. Cardiovascular Heart rhythm and rate regular, no murmur or gallop.. Pedal Pulses WNL. No clubbing, cyanosis or edema. Chest Breasts symmetical and no nipple discharge.. Breast tissue WNL, no masses, lumps, or tenderness.. Lymphatic No adneopathy. No adenopathy. No adenopathy. Musculoskeletal Adexa without tenderness or enlargement.. Digits and nails w/o clubbing, cyanosis, infection,  petechiae, ischemia, or inflammatory conditions.. Integumentary (Hair, Skin) No suspicious lesions. No crepitus or fluctuance. No peri-wound warmth or erythema. No masses.[name] Psychiatric Judgement and insight Intact.. No evidence of depression, anxiety, or agitation.. Notes the wounds on the right knee continue to have subcutaneous debris and both are communicating under the skin bridge. Debridement was done with a forcep and moist saline gauze and a lot of the fibrofatty tissue and clot was removed. The was no active bleeding. Electronic Signature(s) Signed: 08/31/2016 9:21:29 AM By: Christin Fudge MD, FACS Entered By: Christin Fudge on 08/31/2016 84:13:24 [name] [name] ([id number]) -------------------------------------------------------------------------------- Physician Orders Details Patient Name: [name] [name]. Date of Service: 08/31/2016 8:00 AM Medical Record Number: [id number] Patient Account Number: [id number] Date of Birth/Sex: 23-May-1946 (71 y.o. Male) Treating RN: Ahmed Prima Primary Care Provider: [name] Dupre Other Clinician: Referring Provider: [name] Dupre Treating Provider/Extender: Frann Rider in Treatment: 2 Verbal / Phone Orders: Yes Clinician: Pinkerton, Debi Read Back and Verified: Yes Diagnosis Coding Wound Cleansing Wound #1 Right,Anterior Knee o Clean wound with Normal Saline. o Cleanse wound with mild soap and water Wound #2 Right,Medial Knee o Clean wound with Normal Saline. o Cleanse wound with mild soap and water Wound #3 Right,Lateral Knee o Clean wound with Normal Saline. o Cleanse wound with mild soap and water Anesthetic Wound #1 Right,Anterior Knee o Topical Lidocaine 4% cream applied to wound bed prior to debridement - Only in the Wound Clinic Wound #2 Right,Medial Knee o Topical Lidocaine 4% cream applied to wound bed prior to debridement - Only in the Wound Clinic Wound #3 Right,Lateral Knee o  Topical Lidocaine 4% cream applied to wound bed prior to debridement - Only in the Wound Clinic Skin Barriers/Peri-Wound Care Wound #1 Right,Anterior Knee o Skin Prep Wound #2 Right,Medial Knee o Skin Prep Wound #3 Right,Lateral Knee o Skin Prep Primary Wound Dressing Bilski, Rakesh [name]. ([id number]) Wound #1 Right,Anterior Knee o Santyl Ointment o Plain packing gauze - with the santyl Wound #2 Right,Medial Knee o Santyl Ointment Wound #3 Right,Lateral Knee o Santyl Ointment o Plain packing gauze - with the santyl Secondary Dressing Wound #1 Right,Anterior Knee o ABD pad o Dry Gauze Wound #2 Right,Medial Knee o ABD pad o Dry Gauze Wound #3 Right,Lateral Knee o ABD pad o Dry Gauze Dressing Change Frequency Wound #1 Right,Anterior Knee o Change dressing every day. Wound #2 Right,Medial Knee o Change dressing every day. Wound #3 Right,Lateral Knee o Change dressing every day. Follow-up Appointments Wound #1 Right,Anterior Knee o Return Appointment in 1 week. Wound #2 Right,Medial Knee o Return Appointment in 1 week. Wound #3 Right,Lateral Knee o Return Appointment in 1 week. Additional Orders / Instructions Wound #1 Right,Anterior Knee o Activity as tolerated Spurgin, Ra [name]. ([id number]) o Other: - NEEDS  TO BE SEEN THE ORTHOPEDIC FOR RE-EVALUATION OF RIGHT KNEE. Wound #2 Right,Medial Knee o Activity as tolerated o Other: - NEEDS TO BE SEEN THE ORTHOPEDIC FOR RE-EVALUATION OF RIGHT KNEE. Wound #3 Right,Lateral Knee o Activity as tolerated o Other: - NEEDS TO BE SEEN THE ORTHOPEDIC FOR RE-EVALUATION OF RIGHT KNEE. Electronic Signature(s) Signed: 08/31/2016 4:12:24 PM By: Christin Fudge MD, FACS Signed: 08/31/2016 4:32:54 PM By: Alric Quan Entered By: Alric Quan on 08/31/2016 09:20:05 [name] [name] [name] [name] ([id number]) -------------------------------------------------------------------------------- Problem List  Details Patient Name: Poehlman, Laray [name]. Date of Service: 08/31/2016 8:00 AM Medical Record Number: [id number] Patient Account Number: [id number] Date of Birth/Sex: 05/16/1946 (71 y.o. 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, right knee 08/14/2016 Yes L97.812 Non-pressure chronic ulcer of other part of right lower leg 08/14/2016 Yes with fat layer exposed L89.892 Pressure ulcer of other site, stage 2 08/14/2016 Yes M70.861 Other soft tissue disorders related to use, overuse and 08/14/2016 Yes pressure, right lower leg Inactive Problems Resolved Problems Electronic Signature(s) Signed: 08/31/2016 9:13:42 AM By: Christin Fudge MD, FACS Entered By: Christin Fudge on 08/31/2016 09:13:41 [name] [name] [name] ([id number]) -------------------------------------------------------------------------------- Progress Note Details Patient Name: [name] [name]. Date of Service: 08/31/2016 8:00 AM Medical Record Number: [id number] Patient Account Number: [id number] Date of Birth/Sex: July 11, 1945 (71 y.o. Male) Treating RN: Ahmed Prima Primary Care Provider: [name] Dupre Other Clinician: Referring Provider: [name] Dupre Treating Provider/Extender: Frann Rider in Treatment: 2 Subjective Chief Complaint Information obtained from Patient Patient is at the clinic for treatment of an open pressure ulcer to the area of the right knee for about 3 weeks now History of Present Illness (HPI) The following HPI elements were documented for the patient's wound: Location: right knee swelling with skin ulceration Quality: Patient reports experiencing a dull pain to affected  area(s). Severity: Patient states wound are getting worse. Duration: Patient has had the wound for Timing: Pain in wound is Intermittent (comes and goes Context: The wound occurred when the patient had a cast placed over his right lower extremity and with the cast was removed he had a swelling of the knee with problems with his skin Modifying Factors: Other treatment(s) tried include:orthopedics Associated Signs and Symptoms: swelling of both lower extremities with large amount of swelling of the knee 71 year old gentleman who has been living in an independent living facility for long-term care has had recurrent falls and recently had a large hematoma on the right knee. Known to be on aspirin and Plavix for history of CABG and was seen in the ER at the end of February after a fall. past medical history of arthritis, congestive heart failure, diabetes mellitus, hypertension, status post CABG o2, status post cholecystectomy, hernia repair and kidney surgery. recent workup for the fall showed negative clinical findings in his extremities and it was thought that he was falling due to deconditioning. a x-ray showed a right fibular neck fracture and was placed in a splint. he was asked to be seen by orthopedic surgeon. the patient cannot tell me which orthopedic doctor had seen him and I'm not able to get any notes from [place], regarding his orthopedic care. The patient says twice during this last week he has had bloody fluid shooting out of his knee and has drained all over  his boot and the flow. 08/21/2016 -- he was seen by the nurse practitioner at the facility and and distend she aspirated about 60 mL of hemorrhagic fluid from his right knee. 08/31/2016 -- since I saw the patient last he was admitted to the hospital at [facility] between 08/23/2016 2 08/27/2016 and was treated with a pulmonary embolism. he had EKOS complete, was started on Xarelto, and also treated for chronic diastolic  congestive heart failure. he was asked to follow-up with the wound center and continue management locally [name] [name] [name]. ([id number]) Objective Constitutional Pulse regular. Respirations normal and unlabored. Afebrile. Vitals Time Taken: 8:12 AM, Height: 67 in, Weight: 357 lbs, BMI: 55.9, Temperature: 98.5 F, Pulse: 78 bpm, Respiratory Rate: 18 breaths/min, Blood Pressure: 154/59 mmHg. Eyes Nonicteric. Reactive to light. Ears, Nose, Mouth, and Throat Lips, teeth, and gums WNL.[name] Moist mucosa without lesions. Neck supple and nontender. No palpable supraclavicular or cervical adenopathy. Normal sized without goiter. Respiratory WNL. No retractions.. Breath sounds WNL, No rubs, rales, rhonchi, or wheeze.. Cardiovascular Heart rhythm and rate regular, no murmur or gallop.. Pedal Pulses WNL. No clubbing, cyanosis or edema. Chest Breasts symmetical and no nipple discharge.. Breast tissue WNL, no masses, lumps, or tenderness.. Lymphatic No adneopathy. No adenopathy. No adenopathy. Musculoskeletal Adexa without tenderness or enlargement.. Digits and nails w/o clubbing, cyanosis, infection, petechiae, ischemia, or inflammatory conditions.[name] Psychiatric Judgement and insight Intact.. No evidence of depression, anxiety, or agitation.. General Notes: the wounds on the right knee continue to have subcutaneous debris and both are communicating under the skin bridge. Debridement was done with a forcep and moist saline gauze and a lot of the fibrofatty tissue and clot was removed. The was no active bleeding. Integumentary (Hair, Skin) No suspicious lesions. No crepitus or fluctuance. No peri-wound warmth or erythema. No masses.. Wound #1 status is Open. Original cause of wound was Pressure Injury. The wound is located on the Right,Anterior Knee. The wound measures 1.4cm length x 2.4cm width x 0.8cm depth; 2.639cm^2 area and Bowen, Shanan [name]. ([id number]) 2.111cm^3 volume. There is Fat Layer  (Subcutaneous Tissue) Exposed exposed. There is undermining starting at 12:00 and ending at 12:00 with a maximum distance of 4cm. There is a large amount of serosanguineous drainage noted. The wound margin is flat and intact. There is large (67-100%) granulation within the wound bed. There is a small (1-33%) amount of necrotic tissue within the wound bed including Eschar and Adherent Slough. The periwound skin appearance exhibited: Erythema. The periwound skin appearance did not exhibit: Callus, Crepitus, Excoriation, Induration, Rash, Scarring, Dry/Scaly, Maceration, Atrophie Blanche, Cyanosis, Ecchymosis, Hemosiderin Staining, Mottled, Pallor, Rubor. The surrounding wound skin color is noted with erythema which is circumferential. Periwound temperature was noted as No Abnormality. The periwound has tenderness on palpation. Wound #2 status is Open. Original cause of wound was Pressure Injury. The wound is located on the Right,Medial Knee. The wound measures 1.1cm length x 1.5cm width x 0.1cm depth; 1.296cm^2 area and 0.13cm^3 volume. There is Fat Layer (Subcutaneous Tissue) Exposed exposed. There is no tunneling noted, however, there is undermining starting at 9:00 and ending at :00 with a maximum distance of 1cm. There is a large amount of serosanguineous drainage noted. The wound margin is flat and intact. There is medium (34-66%) red granulation within the wound bed. There is a medium (34-66%) amount of necrotic tissue within the wound bed including Eschar and Adherent Slough. The periwound skin appearance exhibited: Scarring, Erythema. The periwound skin appearance did not exhibit: Callus, Crepitus,  Excoriation, Induration, Rash, Dry/Scaly, Maceration, Atrophie Blanche, Cyanosis, Ecchymosis, Hemosiderin Staining, Mottled, Pallor, Rubor. The surrounding wound skin color is noted with erythema which is circumferential. Periwound temperature was noted as No Abnormality. The periwound  has tenderness on palpation. General Notes: The picture label states lateral but that is a mistake it is the medial. Wound #3 status is Open. Original cause of wound was Pressure Injury. The wound is located on the Right,Lateral Knee. The wound measures 1cm length x 1cm width x 0.1cm depth; 0.785cm^2 area and 0.079cm^3 volume. There is Fat Layer (Subcutaneous Tissue) Exposed exposed. There is no tunneling or undermining noted. There is a large amount of serosanguineous drainage noted. The wound margin is distinct with the outline attached to the wound base. There is large (67-100%) pink, pale granulation within the wound bed. There is a small (1-33%) amount of necrotic tissue within the wound bed including Adherent Slough. The periwound skin appearance exhibited: Erythema. The periwound skin appearance did not exhibit: Callus, Crepitus, Excoriation, Induration, Rash, Scarring, Dry/Scaly, Maceration, Atrophie Blanche, Cyanosis, Ecchymosis, Hemosiderin Staining, Mottled, Pallor, Rubor. The surrounding wound skin color is noted with erythema which is circumferential. Periwound temperature was noted as No Abnormality. The periwound has tenderness on palpation. General Notes: The picture label states medial but that is a mistake it is the lateral. 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 ([id number]) Procedures Wound #1 Wound #1 is a Diabetic Wound/Ulcer of the Lower Extremity located on the Right,Anterior Knee . There was a Skin/Subcutaneous Tissue Debridement ([id number]) debridement with total area of 3.36 sq cm performed by Christin Fudge, MD. with the  following instrument(s): Forceps to remove Viable and Non-Viable tissue/material including Fibrin/Slough and Subcutaneous after achieving pain control using Lidocaine 4% Topical Solution. A time out was conducted at 08:30, prior to the start of the procedure. There was no bleeding. The procedure was tolerated well with a pain level of 0 throughout and a pain level of 0 following the procedure. Post Debridement Measurements: 1.4cm length x 2.4cm width x 0.8cm depth; 2.111cm^3 volume. Character of Wound/Ulcer Post Debridement requires further debridement. Severity of Tissue Post Debridement is: Fat layer exposed. Post procedure Diagnosis Wound #1: Same as Pre-Procedure Wound #2 Wound #2 is a Diabetic Wound/Ulcer of the Lower Extremity located on the Right,Medial Knee . There was a Skin/Subcutaneous Tissue Debridement ([id number]) debridement with total area of 1.65 sq cm performed by Christin Fudge, MD. with the following instrument(s): Forceps to remove Viable and Non-Viable tissue/material including Fibrin/Slough and Subcutaneous after achieving pain control using Lidocaine 4% Topical Solution. A time out was conducted at 08:30, prior to the start of the procedure. There was no bleeding. The procedure was tolerated well with a pain level of 0 throughout and a pain level of 0 following the procedure. Post Debridement Measurements: 1.1cm length x 1.5cm width x 0.3cm depth; 0.389cm^3 volume. Character of Wound/Ulcer Post Debridement requires further debridement. Severity of Tissue Post Debridement is: Fat layer exposed. 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 There was a Skin/Subcutaneous Tissue Debridement ([id number]) debridement with total area  of 1 sq cm performed by Christin Fudge, MD. with the following instrument(s): Forceps to remove Viable and  Non-Viable tissue/material including Fibrin/Slough and Subcutaneous after achieving pain control using Lidocaine 4% Topical Solution. A time out was conducted at 08:30, prior to the start of the procedure. There was no bleeding. The procedure was tolerated well with a pain level of 0 throughout and a pain level of 0 following the procedure. Post Debridement Measurements: 1cm length x 1cm width x 0.3cm depth; 0.236cm^3 volume. Character of Wound/Ulcer Post Debridement requires further debridement. Severity of Tissue Post Debridement is: Fat layer exposed. Post procedure Diagnosis Wound #3: Same as Pre-Procedure [name] [name] [name]. ([id number]) Plan Wound Cleansing: Wound #1 Right,Anterior Knee: Clean wound with Normal Saline. Cleanse wound with mild soap and water Wound #2 Right,Medial Knee: Clean wound with Normal Saline. Cleanse wound with mild soap and water Wound #3 Right,Lateral Knee: Clean wound with Normal Saline. Cleanse wound with mild soap and water Anesthetic: Wound #1 Right,Anterior Knee: Topical Lidocaine 4% cream applied to wound bed prior to debridement - Only in the Wound Clinic Wound #2 Right,Medial Knee: Topical Lidocaine 4% cream applied to wound bed prior to debridement - Only in the Wound Clinic Wound #3 Right,Lateral Knee: Topical Lidocaine 4% cream applied to wound bed prior to debridement - Only in the Wound Clinic Skin Barriers/Peri-Wound Care: Wound #1 Right,Anterior Knee: Skin Prep Wound #2 Right,Medial Knee: Skin Prep Wound #3 Right,Lateral Knee: Skin Prep Primary Wound Dressing: Wound #1 Right,Anterior Knee: Santyl Ointment Plain packing gauze - with the santyl Wound #2 Right,Medial Knee: Santyl Ointment Wound #3 Right,Lateral Knee: Santyl Ointment Plain packing gauze - with the santyl Secondary Dressing: Wound #1 Right,Anterior Knee: ABD pad Dry Gauze Wound #2 Right,Medial Knee: ABD pad Dry Gauze Wound #3 Right,Lateral Knee: ABD pad Dry  Gauze Dressing Change Frequency: Wound #1 Right,Anterior Knee: Helvey, [name] [name]. ([id number]) Change dressing every day. Wound #2 Right,Medial Knee: Change dressing every day. Wound #3 Right,Lateral Knee: Change dressing every day. Follow-up Appointments: Wound #1 Right,Anterior Knee: Return Appointment in 1 week. Wound #2 Right,Medial Knee: Return Appointment in 1 week. Wound #3 Right,Lateral Knee: Return Appointment in 1 week. Additional Orders / Instructions: Wound #1 Right,Anterior Knee: Activity as tolerated Other: - NEEDS TO BE SEEN THE ORTHOPEDIC FOR RE-EVALUATION OF RIGHT KNEE. Wound #2 Right,Medial Knee: Activity as tolerated Other: - NEEDS TO BE SEEN THE ORTHOPEDIC FOR RE-EVALUATION OF RIGHT KNEE. Wound #3 Right,Lateral Knee: Activity as tolerated Other: - NEEDS TO BE SEEN THE ORTHOPEDIC FOR RE-EVALUATION OF RIGHT KNEE. I have reviewed his recent admission for pulmonary embolus and the medications have been reviewed by me. after sharp debridement today I have recommended: 1. Santyl ointment locally to the wounds of the right knee and covered with a light Kerlix bandage. 2. I have asked him to see his orthopedic surgeon regarding further care, for the wound and ballotable swelling of his right knee. 3. Review back at the wound center next week Electronic Signature(s) Signed: 08/31/2016 2:57:20 PM By: Christin Fudge MD, FACS Previous Signature: 08/31/2016 9:22:33 AM Version By: Christin Fudge MD, FACS Entered By: Christin Fudge on 08/31/2016 14:57:20 [name] [name] [name] [name] ([id number]) -------------------------------------------------------------------------------- SuperBill Details Patient Name: [name] [name]. Date of Service: 08/31/2016 Medical Record Number: [id number] Patient Account Number: [id number] Date of Birth/Sex: 05-15-1946 (71 y.o. 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 L89.892 Pressure ulcer of other site, stage 2 M70.861 Other soft tissue disorders related to use, overuse and pressure, right lower leg Facility Procedures CPT4: Description Modifier Quantity Code [id number] 11042 - DEB SUBQ TISSUE 20 SQ CM/< 1 ICD-10 Description Diagnosis E11.622 Type 2 diabetes mellitus with other skin ulcer L89.892 Pressure ulcer of other site, stage 2 M70.861 Other soft tissue disorders  related to use, overuse and pressure, right lower leg Physician Procedures CPT4: Description Modifier Quantity Code [id number] 37943 - WC PHYS LEVEL 3 - EST PT 25 1 ICD-10 Description Diagnosis 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 ([id number]) Electronic Signature(s) Signed: 08/31/2016 9:22:54 AM By: Christin Fudge MD, FACS Entered By: Christin Fudge on 08/31/2016 09:22:54

## 2016-09-07 ENCOUNTER — Encounter: Payer: Medicare Other | Admitting: Surgery

## 2016-09-07 DIAGNOSIS — E11622 Type 2 diabetes mellitus with other skin ulcer: Secondary | ICD-10-CM | POA: Diagnosis not present

## 2016-09-07 NOTE — Progress Notes (Addendum)
[name], [name] ([id number]) Visit Report for 09/07/2016 Chief Complaint Document Details Patient Name: [name], [name]. Date of Service: 09/07/2016 12:30 PM Medical Record Number: [id number] Patient Account Number: [id number] Date of Birth/Sex: 1945/11/07 (71 y.o. Male) Treating RN: Ahmed Prima Primary Care Provider: [name] Dupre Other Clinician: Referring Provider: [name] Dupre Treating Provider/Extender: Frann Rider in Treatment: 3 Information Obtained from: Patient Chief Complaint Patient is at the clinic for treatment of an open pressure ulcer to the area of the right knee for about 3 weeks now Electronic Signature(s) Signed: 09/07/2016 1:33:02 PM By: Christin Fudge MD, FACS Entered By: Christin Fudge on 09/07/2016 13:33:02 [place], [name] ([id number]) -------------------------------------------------------------------------------- HPI Details Patient Name: [name] Gathers T. Date of Service: 09/07/2016 12:30 PM Medical Record Number: [id number] Patient Account Number: [id number] Date of Birth/Sex: 1945/06/20 (71 y.o. Male) Treating RN: Ahmed Prima Primary Care Provider: [name] Dupre Other Clinician: Referring Provider: [name] Dupre Treating Provider/Extender: Frann Rider in Treatment: 3 History of Present Illness Location: right knee swelling with skin ulceration Quality: Patient reports experiencing a dull pain to affected area(s). Severity: Patient states wound are getting worse. Duration: Patient has had the wound for <4 weeks prior to presenting for treatment Timing: Pain in wound is Intermittent (comes and goes Context: The wound occurred when the patient had a cast placed over his right lower extremity and with the cast was removed he had a swelling of the knee with problems with his skin Modifying Factors: Other treatment(s) tried include:orthopedics Associated Signs and Symptoms: swelling of both lower extremities with large  amount of swelling of the knee HPI Description: 71 year old gentleman who has been living in an independent living facility for long-term care has had recurrent falls and recently had a large hematoma on the right knee. Known to be on aspirin and Plavix for history of CABG and was seen in the ER at the end of February after a fall. past medical history of arthritis, congestive heart failure, diabetes mellitus, hypertension, status post CABG o2, status post cholecystectomy, hernia repair and kidney surgery. recent workup for the fall showed negative clinical findings in his extremities and it was thought that he was falling due to deconditioning. a x-ray showed a right fibular neck fracture and was placed in a splint. he was asked to be seen by orthopedic surgeon. the patient cannot tell me which orthopedic doctor had seen him and I'm not able to get any notes from [facility], regarding his orthopedic care. The patient says twice during this last week he has had bloody fluid shooting out of his knee and has drained all over his boot and the flow. 08/21/2016 -- he was seen by the nurse practitioner at the facility and and distend she aspirated about 60 mL of hemorrhagic fluid from his right knee. 08/31/2016 -- since I saw the patient last he was admitted to the hospital at [facility] between 08/23/2016 2 08/27/2016 and was treated with a pulmonary embolism. he had EKOS complete, was started on Xarelto, and also treated for chronic diastolic congestive heart failure. he was asked to follow-up with the wound center and continue management locally. 09/07/2016 -- he has an appointment to see his orthopedic surgeon later this week Electronic Signature(s) Signed: 09/07/2016 1:33:32 PM By: Christin Fudge MD, FACS Entered By: Christin Fudge on 09/07/2016 13:33:32 [name], [name] ([id number]) -------------------------------------------------------------------------------- Physical Exam Details Patient Name:  [name] Gathers T. Date of Service: 09/07/2016 12:30 PM Medical Record Number: [id number] Patient Account Number: [id number] Date of Birth/Sex: 27-Apr-1946 (  71 y.o. Male) Treating RN: Carolyne Fiscal, Debi Primary Care Provider: [name] Dupre Other Clinician: Referring Provider: [name] Dupre Treating Provider/Extender: Frann Rider in Treatment: 3 Constitutional . Pulse regular. Respirations normal and unlabored. Afebrile. . Eyes Nonicteric. Reactive to light. Ears, Nose, Mouth, and Throat Lips, teeth, and gums WNL.[name] Moist mucosa without lesions. Neck supple and nontender. No palpable supraclavicular or cervical adenopathy. Normal sized without goiter. Respiratory WNL. No retractions.. Cardiovascular Pedal Pulses WNL. No clubbing, cyanosis or edema. Lymphatic No adneopathy. No adenopathy. No adenopathy. Musculoskeletal Adexa without tenderness or enlargement.. Digits and nails w/o clubbing, cyanosis, infection, petechiae, ischemia, or inflammatory conditions.. Integumentary (Hair, Skin) No suspicious lesions. No crepitus or fluctuance. No peri-wound warmth or erythema. No masses.[name] Psychiatric Judgement and insight Intact.. No evidence of depression, anxiety, or agitation.. Notes the wound on his right knee continues to have large amount of undermining and tunneling and I have been able to wash out with moist saline gauze a lot of clots. There Is no active bleeding. Electronic Signature(s) Signed: 09/07/2016 1:34:33 PM By: Christin Fudge MD, FACS Entered By: Christin Fudge on 09/07/2016 13:34:32 [name], [name] ([id number]) -------------------------------------------------------------------------------- Physician Orders Details Patient Name: [name] Gathers T. Date of Service: 09/07/2016 12:30 PM Medical Record Number: [id number] Patient Account Number: [id number] Date of Birth/Sex: [birth year] (71 y.o. 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 o Cleanse wound with mild soap and water Wound #2 Right,Medial Knee o Clean wound with Normal Saline. o Cleanse wound with mild soap and water Wound #3 Right,Lateral Knee o Clean wound with Normal Saline. o Cleanse wound with mild soap and water Anesthetic Wound #1 Right,Anterior Knee o Topical Lidocaine 4% cream applied to wound bed prior to debridement - Only in the Wound Clinic Wound #2 Right,Medial Knee o Topical Lidocaine 4% cream applied to wound bed prior to debridement - Only in the Wound Clinic Wound #3 Right,Lateral Knee o Topical Lidocaine 4% cream applied to wound bed prior to debridement - Only in the Wound Clinic Skin Barriers/Peri-Wound Care Wound #1 Right,Anterior Knee Current, Branndon T. ([id number]) o Skin Prep Wound #2 Right,Medial Knee o Skin Prep Wound #3 Right,Lateral Knee o Skin Prep Primary Wound Dressing Wound #1 Right,Anterior Knee o Aquacel Ag - rope Wound #2 Right,Medial Knee o Aquacel Ag - rope Wound #3 Right,Lateral Knee o Aquacel Ag Secondary Dressing Wound #1 Right,Anterior Knee o Dry Gauze o Boardered Foam Dressing Wound #2 Right,Medial Knee o Dry Gauze o Boardered Foam Dressing Wound #3 Right,Lateral Knee o ABD pad o Boardered Foam  Dressing Dressing Change Frequency Wound #1 Right,Anterior Knee o Change dressing every day. Wound #2 Right,Medial Knee o Change dressing every day. Wound #3 Right,Lateral Knee o Change dressing every day. Follow-up Appointments Wound #1 Right,Anterior Knee o Return Appointment in 1 week. Wound #2 Right,Medial Knee o Return Appointment in 1 week. [name], [name] ([id number]) Wound #3 Right,Lateral Knee o Return Appointment in 1 week.  Additional Orders / Instructions Wound #1 Right,Anterior Knee o Activity as tolerated o Other: - NEEDS TO BE SEEN THE ORTHOPEDIC FOR RE-EVALUATION OF RIGHT KNEE. Wound #2 Right,Medial Knee o Activity as tolerated o Other: - NEEDS TO BE SEEN THE ORTHOPEDIC FOR RE-EVALUATION OF RIGHT KNEE. Wound #3 Right,Lateral Knee o Activity as tolerated o Other: - NEEDS TO BE SEEN THE ORTHOPEDIC FOR RE-EVALUATION OF RIGHT KNEE. Electronic Signature(s) Signed: 09/07/2016 4:10:34 PM By: Christin Fudge MD, FACS Signed: 09/07/2016 4:31:30 PM By: Alric Quan Entered By: Alric Quan on 09/07/2016 13:35:08 [name], [name] ([id number]) -------------------------------------------------------------------------------- Problem List Details Patient Name: [name] Gathers T. Date of Service: 09/07/2016 12:30 PM Medical Record Number: [id number] Patient Account Number: [id number] Date of Birth/Sex: 1945/11/19 (71 y.o. 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, right knee 08/14/2016 Yes L97.812 Non-pressure chronic ulcer of other part of right lower leg 08/14/2016 Yes with fat layer exposed L89.892 Pressure ulcer of other  site, stage 2 08/14/2016 Yes M70.861 Other soft tissue disorders related to use, overuse and 08/14/2016 Yes pressure, right lower leg Inactive Problems Resolved Problems Electronic Signature(s) Signed: 09/07/2016 1:32:47 PM By: Christin Fudge MD, FACS Previous Signature: 09/07/2016 12:49:25 PM Version By: Christin Fudge MD, FACS Entered By: Christin Fudge on 09/07/2016 13:32:46 [name], [name] ([id number]) -------------------------------------------------------------------------------- Progress Note Details Patient Name: [name] Gathers T. Date of Service: 09/07/2016 12:30 PM Medical Record Number: [id number] Patient Account Number: [id number] Date of Birth/Sex: July 05, 1945 (71 y.o. Male) Treating RN: Ahmed Prima Primary Care Provider: [name] Dupre Other Clinician: Referring Provider: [name] Dupre Treating Provider/Extender: Frann Rider in Treatment: 3 Subjective Chief Complaint Information obtained from Patient Patient is at the clinic for treatment of an open pressure ulcer to the area of the right knee for about 3 weeks now History of Present Illness (HPI) The following HPI elements were documented for the patient's wound: Location: right knee swelling with skin ulceration Quality: Patient reports experiencing a dull pain to affected area(s). Severity: Patient states wound are getting worse. Duration: Patient has had the wound for Timing: Pain in wound is Intermittent (comes and goes Context: The wound occurred when the patient had a cast placed over his right lower extremity and with the cast was removed he had a swelling of the knee with problems with his skin Modifying Factors: Other treatment(s) tried include:orthopedics Associated Signs and Symptoms: swelling of both lower extremities with large amount of swelling of the knee 71 year old gentleman who has been living in an independent living facility for long-term care has had recurrent falls and recently had  a large hematoma on the right knee. Known to be on aspirin and Plavix for history of CABG and was seen in the ER at the end of February after a fall. past medical history of arthritis, congestive heart failure, diabetes mellitus, hypertension, status post CABG o2, status post cholecystectomy, hernia repair and kidney surgery. recent workup for the fall showed negative clinical findings in his extremities and it was thought that he was falling due to deconditioning. a x-ray showed a right fibular neck fracture and was placed in a splint. he was asked to be seen by orthopedic surgeon. the patient cannot tell me which orthopedic doctor had seen him and I'm not able to get any notes from [place],  regarding his orthopedic care. The patient says twice during this last week he has had bloody fluid shooting out of his knee and has drained all over his boot and the flow. 08/21/2016 -- he was seen by the nurse practitioner at the facility and and distend she aspirated about 60 mL of hemorrhagic fluid from his right knee. 08/31/2016 -- since I saw the patient last he was admitted to the hospital at [facility] between 08/23/2016 2 08/27/2016 and was treated with a pulmonary embolism. he had EKOS complete, was started on Xarelto, and also treated for chronic diastolic congestive heart failure. he was asked to follow-up with the wound center and continue management locally. 09/07/2016 -- he has an appointment to see his orthopedic surgeon later this week [name], [name] ([id number]) Objective Constitutional Pulse regular. Respirations normal and unlabored. Afebrile. Vitals Time Taken: 12:59 PM, Height: 67 in, Weight: 357 lbs, BMI: 55.9, Temperature: 98.5 F, Pulse: 62 bpm, Respiratory Rate: 18 breaths/min, Blood Pressure: 110/72 mmHg. Eyes Nonicteric. Reactive to light. Ears, Nose, Mouth, and Throat Lips, teeth, and gums WNL.[name] Moist mucosa without lesions. Neck supple and nontender. No palpable  supraclavicular or cervical adenopathy. Normal sized without goiter. Respiratory WNL. No retractions.. Cardiovascular Pedal Pulses WNL. No clubbing, cyanosis or edema. Lymphatic No adneopathy. No adenopathy. No adenopathy. Musculoskeletal Adexa without tenderness or enlargement.. Digits and nails w/o clubbing, cyanosis, infection, petechiae, ischemia, or inflammatory conditions.[name] Psychiatric Judgement and insight Intact.. No evidence of depression, anxiety, or agitation.. General Notes: the wound on his right knee continues to have large amount of undermining and tunneling and I have been able to wash out with moist saline gauze a lot of clots. There Is no active bleeding. Integumentary (Hair, Skin) No suspicious lesions. No crepitus or fluctuance. No peri-wound warmth or erythema. No masses.. Wound #1 status is Open. Original cause of wound was Pressure Injury. The wound is located on the Right,Anterior Knee. The wound measures 1.4cm length x 2.8cm width x 1cm depth; 3.079cm^2 area and 3.079cm^3 volume. There is Fat Layer (Subcutaneous Tissue) Exposed exposed. There is no tunneling [name], [name] ([id number]) noted, however, there is undermining starting at 12:00 and ending at 12:00 with a maximum distance of 5.7cm. There is a large amount of serosanguineous drainage noted. The wound margin is flat and intact. There is large (67-100%) granulation within the wound bed. There is a small (1-33%) amount of necrotic tissue within the wound bed including Adherent Slough. The periwound skin appearance exhibited: Erythema. The periwound skin appearance did not exhibit: Callus, Crepitus, Excoriation, Induration, Rash, Scarring, Dry/Scaly, Maceration, Atrophie Blanche, Cyanosis, Ecchymosis, Hemosiderin Staining, Mottled, Pallor, Rubor. The surrounding wound skin color is noted with erythema which is circumferential. Periwound temperature was noted as No Abnormality. The periwound has tenderness  on palpation. General Notes: anterior and medial wound connect Wound #2 status is Open. Original cause of wound was Pressure Injury. The wound is located on the Right,Medial Knee. The wound measures 1cm length x 1.4cm width x 0.9cm depth; 1.1cm^2 area and 0.99cm^3 volume. There is Fat Layer (Subcutaneous Tissue) Exposed exposed. There is no tunneling noted, however, there is undermining starting at 6:00 and ending at 12:00 with a maximum distance of 0.8cm. There is a large amount of serosanguineous drainage noted. The wound margin is flat and intact. There is medium (34-66%) red granulation within the wound bed. There is a medium (34-66%) amount of necrotic tissue within the wound bed including Eschar and Adherent Slough. The periwound skin appearance exhibited: Scarring,  Erythema. The periwound skin appearance did not exhibit: Callus, Crepitus, Excoriation, Induration, Rash, Dry/Scaly, Maceration, Atrophie Blanche, Cyanosis, Ecchymosis, Hemosiderin Staining, Mottled, Pallor, Rubor. The surrounding wound skin color is noted with erythema which is circumferential. Periwound temperature was noted as No Abnormality. The periwound has tenderness on palpation. General Notes: anterior and medial wound connect Wound #3 status is Open. Original cause of wound was Pressure Injury. The wound is located on the Right,Lateral Knee. The wound measures 0.2cm length x 0.9cm width x 0.1cm depth; 0.141cm^2 area and 0.014cm^3 volume. There is Fat Layer (Subcutaneous Tissue) Exposed exposed. There is no tunneling or undermining noted. There is a large amount of serosanguineous drainage noted. The wound margin is distinct with the outline attached to the wound base. There is large (67-100%) pink, pale granulation within the wound bed. There is a small (1-33%) amount of necrotic tissue within the wound bed including Adherent Slough. The periwound skin appearance exhibited: Erythema. The periwound skin appearance did  not exhibit: Callus, Crepitus, Excoriation, Induration, Rash, Scarring, Dry/Scaly, Maceration, Atrophie Blanche, Cyanosis, Ecchymosis, Hemosiderin Staining, Mottled, Pallor, Rubor. The surrounding wound skin color is noted with erythema which is circumferential. Periwound temperature was noted as No Abnormality. The periwound has tenderness on palpation. 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 ([id number]) Plan Wound Cleansing: Wound #1 Right,Anterior Knee: Clean wound with Normal Saline. Cleanse wound with mild soap and water Wound #2 Right,Medial Knee: Clean wound with Normal Saline. Cleanse wound with mild soap and water Wound #3 Right,Lateral Knee: Clean wound with Normal Saline. Cleanse wound with mild soap and water Anesthetic: Wound #1 Right,Anterior Knee: Topical Lidocaine 4% cream applied to wound bed prior to debridement - Only in the Wound Clinic Wound #2 Right,Medial Knee: Topical Lidocaine 4% cream applied to wound bed prior to debridement - Only in the Wound Clinic Wound #3 Right,Lateral Knee: Topical Lidocaine 4% cream applied to wound bed prior to debridement - Only in the Wound Clinic Skin Barriers/Peri-Wound Care: Wound #1 Right,Anterior Knee: Skin Prep Wound #2 Right,Medial Knee: Skin Prep Wound #3 Right,Lateral Knee: Skin Prep Primary Wound Dressing: Wound #1 Right,Anterior Knee: Aquacel Ag - rope Wound #2 Right,Medial Knee: Aquacel Ag - rope Wound #3 Right,Lateral Knee: Aquacel Ag Secondary Dressing: Wound #1 Right,Anterior Knee: Dry Gauze Boardered Foam Dressing Wound #2 Right,Medial Knee: Dry  Gauze Boardered Foam Dressing Wound #3 Right,Lateral Knee: ABD pad Boardered Foam Dressing Dressing Change Frequency: Ellington, Malick T. ([id number]) Wound #1 Right,Anterior Knee: Change dressing every day. Wound #2 Right,Medial Knee: Change dressing every day. Wound #3 Right,Lateral Knee: Change dressing every day. Follow-up Appointments: Wound #1 Right,Anterior Knee: Return Appointment in 1 week. Wound #2 Right,Medial Knee: Return Appointment in 1 week. Wound #3 Right,Lateral Knee: Return Appointment in 1 week. Additional Orders / Instructions: Wound #1 Right,Anterior Knee: Activity as tolerated Other: - NEEDS TO BE SEEN THE ORTHOPEDIC FOR RE-EVALUATION OF RIGHT KNEE. Wound #2 Right,Medial Knee: Activity as tolerated Other: - NEEDS TO BE SEEN THE ORTHOPEDIC FOR RE-EVALUATION OF RIGHT KNEE. Wound #3 Right,Lateral Knee: Activity as tolerated Other: - NEEDS TO BE SEEN THE ORTHOPEDIC FOR RE-EVALUATION OF RIGHT KNEE. after review today I have recommended: 1. Silver alginate  rope to be packed into the wounds of the right knee and covered with a light Kerlix bandage. 2. I have asked him to see his orthopedic surgeon regarding further care, for the wound and ballotable swelling of his right knee. 3. If the orthopedic surgeon has no objection and this wound does not communicate with his knee joint, we would recommend a wound VAC application to this knee 4. Review back at the wound center next week Electronic Signature(s) Signed: 09/07/2016 1:36:33 PM By: Christin Fudge MD, FACS Entered By: Christin Fudge on 09/07/2016 13:36:33 Novitsky, [name] ([id number]) -------------------------------------------------------------------------------- SuperBill Details Patient Name: [name] Gathers T. Date of Service: 09/07/2016 Medical Record Number: [id number] Patient Account Number: [id number] Date of Birth/Sex: 03/26/1946 (71 y.o. 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 L89.892 Pressure ulcer of other site, stage 2 M70.861 Other soft tissue disorders related to use, overuse and pressure, right lower leg Facility Procedures CPT4 Code: [id number] Description: 99214 - WOUND CARE VISIT-LEV 4 EST PT Modifier: Quantity: 1 Physician Procedures CPT4: Description Modifier Quantity Code [id number] 99213 - WC PHYS LEVEL 3 - EST PT 1 ICD-10 Description Diagnosis 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

## 2016-09-08 NOTE — Progress Notes (Signed)
SHELTON, SQUARE ([id number]) Visit Report for 09/07/2016 Arrival Information Details Patient Name: [name], [name]. Date of Service: 09/07/2016 12:30 PM Medical Record Number: [id number] Patient Account Number: [id number] Date of Birth/Sex: 07/10/1945 (71 y.o. Male) Treating RN: Ahmed Prima Primary Care Alphonso Gregson: [name] Dupre Other Clinician: Referring Soul Hackman: [name] Dupre Treating Felipe Paluch/Extender: Frann Rider in Treatment: 3 Visit Information History Since Last Visit All ordered tests and consults were completed: No Patient Arrived: Wheel Chair Added or deleted any medications: No Arrival Time: 12:55 Any new allergies or adverse reactions: No Accompanied By: self Had a fall or experienced change in No activities of daily living that may affect Transfer Assistance: None risk of falls: Patient Identification Verified: Yes Signs or symptoms of abuse/neglect since last No Secondary Verification Process Yes visito Completed: Hospitalized since last visit: No Patient Requires Transmission-Based No Has Dressing in Place as Prescribed: Yes Precautions: Pain Present Now: No Patient Has Alerts: No Electronic Signature(s) Signed: 09/07/2016 4:31:30 PM By: Alric Quan Entered By: Alric Quan on 09/07/2016 12:59:10 Weisbecker, [name] ([id number]) -------------------------------------------------------------------------------- Clinic Level of Care Assessment Details Patient Name: [name]. Date of Service: 09/07/2016 12:30 PM Medical Record Number: [id number] Patient Account Number: [id number] Date of Birth/Sex: Sep 25, 1945 (71 y.o. Male) Treating RN: Ahmed Prima Primary Care Koki Buxton: [name] Dupre Other Clinician: Referring Greogry Goodwyn: [name] Dupre Treating Antiono Ettinger/Extender: Frann Rider in Treatment: 3 Clinic Level of Care Assessment Items TOOL 4 Quantity Score X - Use when only an EandM is performed on FOLLOW-UP visit 1  0 ASSESSMENTS - Nursing Assessment / Reassessment X - Reassessment of Co-morbidities (includes updates in patient status) 1 10 X - Reassessment of Adherence to Treatment Plan 1 5 ASSESSMENTS - Wound and Skin Assessment / Reassessment []  - Simple Wound Assessment / Reassessment - one wound 0 X - Complex Wound Assessment / Reassessment - multiple wounds 3 5 []  - Dermatologic / Skin Assessment (not related to wound area) 0 ASSESSMENTS - Focused Assessment []  - Circumferential Edema Measurements - multi extremities 0 []  - Nutritional Assessment / Counseling / Intervention 0 []  - Lower Extremity Assessment (monofilament, tuning fork, pulses) 0 []  - Peripheral Arterial Disease Assessment (using hand held doppler) 0 ASSESSMENTS - Ostomy and/or Continence Assessment and Care []  - Incontinence Assessment and Management 0 []  - Ostomy Care Assessment and Management (repouching, etc.) 0 PROCESS - Coordination of Care []  - Simple Patient / Family Education for ongoing care 0 X - Complex (extensive) Patient / Family Education for ongoing care 1 20 X - Staff obtains Programmer, systems, Records, Test Results / Process Orders 1 10 X - Staff telephones HHA, Nursing Homes / Clarify orders / etc 1 10 []  - Routine Transfer to another Facility (non-emergent condition) 0 [name], [name] ([id number]) []  - Routine Hospital Admission (non-emergent condition) 0 []  - New Admissions / Biomedical engineer / Ordering NPWT, Apligraf, etc. 0 []  - Emergency Hospital Admission (emergent condition) 0 X - Simple Discharge Coordination 1 10 []  - Complex (extensive) Discharge Coordination 0 PROCESS - Special Needs []  - Pediatric / Minor Patient Management 0 []  - Isolation Patient Management 0 []  - Hearing / Language / Visual special needs 0 []  - Assessment of Community assistance (transportation, D/C planning, etc.) 0 []  - Additional assistance / Altered mentation 0 []  - Support Surface(s) Assessment (bed, cushion, seat,  etc.) 0 INTERVENTIONS - Wound Cleansing / Measurement []  - Simple Wound Cleansing - one wound 0 X - Complex Wound Cleansing - multiple wounds 3 5 X - Wound  Imaging (photographs - any number of wounds) 1 5 []  - Wound Tracing (instead of photographs) 0 []  - Simple Wound Measurement - one wound 0 X - Complex Wound Measurement - multiple wounds 3 5 INTERVENTIONS - Wound Dressings []  - Small Wound Dressing one or multiple wounds 0 X - Medium Wound Dressing one or multiple wounds 1 15 []  - Large Wound Dressing one or multiple wounds 0 X - Application of Medications - topical 1 5 []  - Application of Medications - injection 0 INTERVENTIONS - Miscellaneous []  - External ear exam 0 [name], [name] ([id number]) []  - Specimen Collection (cultures, biopsies, blood, body fluids, etc.) 0 []  - Specimen(s) / Culture(s) sent or taken to Lab for analysis 0 []  - Patient Transfer (multiple staff / Harrel Lemon Lift / Similar devices) 0 []  - Simple Staple / Suture removal (25 or less) 0 []  - Complex Staple / Suture removal (26 or more) 0 []  - Hypo / Hyperglycemic Management (close monitor of Blood Glucose) 0 []  - Ankle / Brachial Index (ABI) - do not check if billed separately 0 X - Vital Signs 1 5 Has the patient been seen at the hospital within the last three years: Yes Total Score: 140 Level Of Care: New/Established - Level 4 Electronic Signature(s) Signed: 09/07/2016 4:31:30 PM By: Alric Quan Entered By: Alric Quan on 09/07/2016 14:20:24 [name], [name] ([id number]) -------------------------------------------------------------------------------- Encounter Discharge Information Details Patient Name: [name] Gathers T. Date of Service: 09/07/2016 12:30 PM Medical Record Number: [id number] Patient Account Number: [id number] Date of Birth/Sex: 24-Jun-1945 (71 y.o. Male) Treating RN: Ahmed Prima Primary Care Caroleen Stoermer: [name] Dupre Other Clinician: Referring Narayan Scull: [name] Dupre Treating Shemeika Starzyk/Extender: Frann Rider in Treatment: 3 Encounter Discharge Information Items Discharge Pain Level: 0 Discharge Condition: Stable Ambulatory Status: Wheelchair Discharge Destination: Nursing Home Transportation: Other Accompanied By: self Schedule Follow-up Appointment: Yes Medication Reconciliation completed and provided to Patient/Care No Netasha Wehrli: Provided on Clinical Summary of Care: 09/07/2016 Form Type Recipient Paper Patient JE Electronic Signature(s) Signed: 09/07/2016 4:31:30 PM By: Alric Quan Previous Signature: 09/07/2016 1:34:54 PM Version By: Ruthine Dose Entered By: Alric Quan on 09/07/2016 14:22:09 [name], [name] ([id number]) -------------------------------------------------------------------------------- Lower Extremity Assessment Details Patient Name: [name] Gathers T. Date of Service: 09/07/2016 12:30 PM Medical Record Number: [id number] Patient Account Number: [id number] Date of Birth/Sex: 04-15-1946 (71 y.o. Male) Treating RN: Ahmed Prima Primary Care Kennesha Brewbaker: [name] Dupre Other Clinician: Referring Sharde Gover: [name] Dupre Treating Calirose Mccance/Extender: Frann Rider in Treatment: 3 Vascular Assessment Pulses: Dorsalis Pedis Palpable: [Right:Yes] Posterior Tibial Extremity colors, hair growth, and conditions: Extremity Color: [Right:Normal] Temperature of Extremity: [Right:Warm] Capillary Refill: [Right:< 3 seconds] Electronic Signature(s) Signed: 09/07/2016 4:31:30 PM By: Alric Quan Entered By: Alric Quan on 09/07/2016 13:17:14 [name], [name] ([id number]) -------------------------------------------------------------------------------- Multi Wound Chart Details Patient Name: [name] Gathers T. Date of Service: 09/07/2016 12:30 PM Medical Record Number: [id number] Patient Account Number: [id number] Date of Birth/Sex: [birth year] (71 y.o. 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] [2:the Lower Extremity] [3:the Lower Extremity] Comorbid History: [1:Anemia,  Asthma, Arrhythmia, Congestive Arrhythmia, Congestive Arrhythmia, Congestive Heart Failure, Coronary Heart Failure, Coronary Heart Failure, Coronary Artery Disease, Hypertension, Type II Diabetes, Osteoarthritis Diabetes,  Osteoarthritis Diabetes, Osteoarthritis] [2:Anemia, Asthma, Artery Disease, Hypertension, Type II] [3:Anemia, Asthma, Artery Disease, Hypertension, Type II] Date Acquired: [1:07/23/2016] [2:07/23/2016] [3:07/23/2016] Weeks of Treatment: [1:3] [2:3] [3:3] Wound Status: [1:Open] [2:Open] [3:Open] Measurements L x W x D 1.4x2.8x1 [2:1x1.4x0.9] [3:0.2x0.9x0.1] (cm) Area (cm) : [1:3.079] [2:1.1] [3:0.141] Volume (cm) : [1:3.079] [2:0.99] [3:0.014] % Reduction in Area: [1:60.80%] [2:65.00%] [3:97.50%] % Reduction in Volume: -292.20% [2:-215.30%] [3:97.50%] Starting Position 1 12 [2:6] (o'clock): Ending Position 1 [1:12] [2:12] (o'clock): Maximum Distance 1 5.7 [2:0.8] (cm): Undermining: [1:Yes] [2:Yes] [3:No] Classification: [1:Unable to visualize wound Unable to visualize wound Grade 1 bed] [2:bed] Exudate Amount: [1:Large] [2:Large] [3:Large] Exudate Type: Serosanguineous Serosanguineous Serosanguineous Exudate Color: red, brown  red, brown red, brown Wound Margin: Flat and Intact Flat and Intact Distinct, outline attached Granulation Amount: Large (67-100%) Medium (34-66%) Large (67-100%) Granulation Quality: N/A Red Pink, Pale Necrotic Amount: Small (1-33%) Medium (34-66%) Small (1-33%) Necrotic Tissue: Adherent Slough Eschar, Adherent Slough Adherent [employer] Exposed Structures: Fat Layer (Subcutaneous Fat Layer (Subcutaneous Fat Layer (Subcutaneous Tissue) Exposed: Yes Tissue) Exposed: Yes Tissue) Exposed: Yes Fascia: No Fascia: No Fascia: No Tendon: No Tendon: No Tendon: No Muscle: No Muscle: No Muscle: No Joint: No Joint: No Joint: No Bone: No Bone: No Bone: No Epithelialization: None None None Periwound Skin Texture: Excoriation: No Scarring: Yes Excoriation: No Induration: No Excoriation: No Induration: No Callus: No Induration: No Callus: No Crepitus: No Callus: No Crepitus: No Rash: No Crepitus: No Rash: No Scarring: No Rash: No Scarring: No Periwound Skin Maceration: No Maceration: No Maceration: No Moisture: Dry/Scaly: No Dry/Scaly: No Dry/Scaly: No Periwound Skin Color: Erythema: Yes Erythema: Yes Erythema: Yes Atrophie Blanche: No Atrophie Blanche: No Atrophie Blanche: No Cyanosis: No Cyanosis: No Cyanosis: No Ecchymosis: No Ecchymosis: No Ecchymosis: No Hemosiderin Staining: No Hemosiderin Staining: No Hemosiderin Staining: No Mottled: No Mottled: No Mottled: No Pallor: No Pallor: No Pallor: No Rubor: No Rubor: No Rubor: No Erythema Location: Circumferential Circumferential Circumferential Temperature: No Abnormality No Abnormality No Abnormality Tenderness on Yes Yes Yes Palpation: Wound Preparation: Ulcer Cleansing: Ulcer Cleansing: Ulcer Cleansing: Rinsed/Irrigated with Rinsed/Irrigated with Rinsed/Irrigated with Saline Saline Saline Topical Anesthetic Topical Anesthetic Topical Anesthetic Applied: Other: Lidocaine Applied: Other: Lidocaine  Applied: Other: lidocain 4% 4% 4% Assessment Notes: anterior and medial anterior and medial N/A wound connect wound connect Treatment Notes Electronic Signature(s) Signed: 09/07/2016 1:32:54 PM By: Christin Fudge MD, FACS [name], [name] ([id number]) Entered By: Christin Fudge on 09/07/2016 13:32:53 [name], [name] ([id number]) -------------------------------------------------------------------------------- [place] Details Patient Name: [name] Gathers T. Date of Service: 09/07/2016 12:30 PM Medical Record Number: [id number] Patient Account Number: [id number] Date of Birth/Sex: 13-Jan-1946 (71 y.o. Male) Treating RN: Ahmed Prima Primary Care Julee Stoll: [name] Dupre Other Clinician: Referring Olivette Beckmann: [name] Dupre Treating Havoc Sanluis/Extender: Frann Rider in Treatment: 3 Active Inactive ` Orientation to the Wound Care Program Nursing Diagnoses: Knowledge deficit related to the wound healing center program Goals: Patient/caregiver will verbalize understanding of the [place] [facility] Program Date Initiated: 08/14/2016 Target Resolution Date: 12/14/2016 Goal Status: Active Interventions: Provide education on orientation to the wound center Notes: ` Pressure Nursing Diagnoses: Knowledge deficit related to causes and risk factors for pressure ulcer development Knowledge deficit related to management of pressures ulcers Potential for impaired tissue integrity related to pressure, friction, moisture, and shear Goals: Patient will remain free from development of additional  pressure ulcers Date Initiated: 08/14/2016 Target Resolution Date: 12/14/2016 Goal Status: Active Patient will remain free of pressure ulcers Date Initiated: 08/14/2016 Target Resolution Date: 12/14/2016 Goal Status: Active Patient/caregiver will verbalize risk factors for pressure ulcer development Date Initiated: 08/14/2016 Target Resolution Date: 12/14/2016 Goal Status:  Active Patient/caregiver will verbalize understanding of pressure ulcer management Date Initiated: 08/14/2016 Target Resolution Date: 12/14/2016 Goal Status: Active [name], [name] ([id number]) Interventions: Assess: immobility, friction, shearing, incontinence upon admission and as needed Assess offloading mechanisms upon admission and as needed Assess potential for pressure ulcer upon admission and as needed Provide education on pressure ulcers Treatment Activities: Patient referred for seating evaluation to ensure proper offloading : 08/14/2016 Pressure reduction/relief device ordered : 08/14/2016 Notes: ` Wound/Skin Impairment Nursing Diagnoses: Impaired tissue integrity Knowledge deficit related to ulceration/compromised skin integrity Goals: Patient/caregiver will verbalize understanding of skin care regimen Date Initiated: 08/14/2016 Target Resolution Date: 12/14/2016 Goal Status: Active Ulcer/skin breakdown will have a volume reduction of 30% by week 4 Date Initiated: 08/14/2016 Target Resolution Date: 12/14/2016 Goal Status: Active Ulcer/skin breakdown will have a volume reduction of 50% by week 8 Date Initiated: 08/14/2016 Target Resolution Date: 12/14/2016 Goal Status: Active Ulcer/skin breakdown will have a volume reduction of 80% by week 12 Date Initiated: 08/14/2016 Target Resolution Date: 12/14/2016 Goal Status: Active Ulcer/skin breakdown will heal within 14 weeks Date Initiated: 08/14/2016 Target Resolution Date: 12/14/2016 Goal Status: Active Interventions: Assess patient/caregiver ability to obtain necessary supplies Assess patient/caregiver ability to perform ulcer/skin care regimen upon admission and as needed Assess ulceration(s) every visit Provide education on ulcer and skin care Treatment Activities: Referred to DME Georges Victorio for dressing supplies : 08/14/2016 Skin care regimen initiated : 08/14/2016 [name], [name] ([id number]) Topical wound management  initiated : 08/14/2016 Notes: Electronic Signature(s) Signed: 09/07/2016 4:31:30 PM By: Alric Quan Entered By: Alric Quan on 09/07/2016 13:18:23 [name], [name] ([id number]) -------------------------------------------------------------------------------- Pain Assessment Details Patient Name: [name] Gathers T. Date of Service: 09/07/2016 12:30 PM Medical Record Number: [id number] Patient Account Number: [id number] Date of Birth/Sex: 07/01/1945 (71 y.o. Male) Treating RN: Ahmed Prima Primary Care Zalen Sequeira: [name] Dupre Other Clinician: Referring Anie Juniel: [name] Dupre Treating Hava Massingale/Extender: Frann Rider in Treatment: 3 Active Problems Location of Pain Severity and Description of Pain Patient Has Paino No Site Locations With Dressing Change: No Pain Management and Medication Current Pain Management: Electronic Signature(s) Signed: 09/07/2016 4:31:30 PM By: Alric Quan Entered By: Alric Quan on 09/07/2016 12:59:16 [name], [name] ([id number]) -------------------------------------------------------------------------------- Patient/Caregiver Education Details Patient Name: [name]. Date of Service: 09/07/2016 12:30 PM Medical Record Number: [id number] Patient Account Number: [id number] Date of Birth/Gender: 06-03-1945 (71 y.o. Male) Treating RN: Ahmed Prima Primary Care Physician: [name] Dupre Other Clinician: Referring Physician: [name] Dupre Treating Physician/Extender: Frann Rider in Treatment: 3 Education Assessment Education Provided To: Patient Education Topics Provided Wound/Skin Impairment: Handouts: Other: change dressing as ordered Methods: Demonstration, Explain/Verbal Responses: State content correctly Electronic Signature(s) Signed: 09/07/2016 4:31:30 PM By: Alric Quan Entered By: Alric Quan on 09/07/2016 14:22:20 [name], [name]  ([id number]) -------------------------------------------------------------------------------- Wound Assessment Details Patient Name: [name] Gathers T. Date of Service: 09/07/2016 12:30 PM Medical Record Number: [id number] Patient Account Number: [id number] Date of Birth/Sex: December 10, 1945 (71 y.o. Male) Treating RN: Ahmed Prima Primary Care Stephene Alegria: [name] Dupre Other Clinician: Referring Marye Eagen: [name] Dupre Treating Dois Juarbe/Extender: Frann Rider in Treatment: 3 Wound Status Wound Number: 1 Primary Diabetic Wound/Ulcer of the Lower Etiology: Extremity Wound Location: Right Knee - Anterior Wound Open Wounding Event: Pressure Injury Status:  Date Acquired: 07/23/2016 Comorbid Anemia, Asthma, Arrhythmia, Weeks Of Treatment: 3 History: Congestive Heart Failure, Coronary Clustered Wound: No Artery Disease, Hypertension, Type II Diabetes, Osteoarthritis Photos Photo Uploaded By: Alric Quan on 09/07/2016 16:27:13 Wound Measurements Length: (cm) 1.4 % Reduction in Width: (cm) 2.8 % Reduction in Depth: (cm) 1 Epithelializati Area: (cm) 3.079 Tunneling: Volume: (cm) 3.079 Undermining: Starting Pos Ending Posit Maximum Dist Area: 60.8% Volume: -292.2% on: None No Yes ition (o'clock): 12 ion (o'clock): 12 ance: (cm) 5.7 Wound Description Classification: Unable to visualize wound bed Wound Margin: Flat and Intact Exudate Amount: Large Exudate Type: Serosanguineous Exudate Color: red, brown Schermer, Ala T. ([id number]) Foul Odor After Cleansing: No Slough/Fibrino Yes Wound Bed Granulation Amount: Large (67-100%) Exposed Structure Necrotic Amount: Small (1-33%) Fascia Exposed: No Necrotic Quality: Adherent Slough Fat Layer (Subcutaneous Tissue) Exposed: Yes Tendon Exposed: No Muscle Exposed: No Joint Exposed: No Bone Exposed: No Periwound Skin Texture Texture Color No Abnormalities Noted: No No Abnormalities Noted: No Callus:  No Atrophie Blanche: No Crepitus: No Cyanosis: No Excoriation: No Ecchymosis: No Induration: No Erythema: Yes Rash: No Erythema Location: Circumferential Scarring: No Hemosiderin Staining: No Mottled: No Moisture Pallor: No No Abnormalities Noted: No Rubor: No Dry / Scaly: No Maceration: No Temperature / Pain Temperature: No Abnormality Tenderness on Palpation: Yes Wound Preparation Ulcer Cleansing: Rinsed/Irrigated with Saline Topical Anesthetic Applied: Other: Lidocaine 4%, Assessment Notes anterior and medial wound connect Treatment Notes Wound #1 (Right, Anterior Knee) 1. Cleansed with: Clean wound with Normal Saline 2. Anesthetic Topical Lidocaine 4% cream to wound bed prior to debridement 3. Peri-wound Care: Skin Prep 4. Dressing Applied: Aquacel Ag 5. Secondary Dressing Applied Bordered Foam Dressing Dry Gauze Electronic Signature(s) TYRI, ELMORE ([id number]) Signed: 09/07/2016 4:31:30 PM By: Alric Quan Entered By: Alric Quan on 09/07/2016 13:09:02 Arenz, [name] ([id number]) -------------------------------------------------------------------------------- Wound Assessment Details Patient Name: [name] Gathers T. Date of Service: 09/07/2016 12:30 PM Medical Record Number: [id number] Patient Account Number: [id number] Date of Birth/Sex: 04-May-1946 (71 y.o. Male) Treating RN: Ahmed Prima Primary Care Caillou Minus: [name] Dupre Other Clinician: Referring Nylia Gavina: [name] Dupre Treating Celestine Bougie/Extender: Frann Rider in Treatment: 3 Wound Status Wound Number: 2 Primary Diabetic Wound/Ulcer of the Lower Etiology: Extremity Wound Location: Right Knee - Medial Wound Open Wounding Event: Pressure Injury Status: Date Acquired: 07/23/2016 Comorbid Anemia, Asthma, Arrhythmia, Weeks Of Treatment: 3 History: Congestive Heart Failure, Coronary Clustered Wound: No Artery Disease, Hypertension, Type II Diabetes,  Osteoarthritis Photos Photo Uploaded By: Alric Quan on 09/07/2016 16:27:13 Wound Measurements Length: (cm) 1 % Reduction in Width: (cm) 1.4 % Reduction in Depth: (cm) 0.9 Epithelializati Area: (cm) 1.1 Tunneling: Volume: (cm) 0.99 Undermining: Starting Pos Ending Posit Maximum Dist Area: 65% Volume: -215.3% on: None No Yes ition (o'clock): 6 ion (o'clock): 12 ance: (cm) 0.8 Wound Description Classification: Unable to visualize wound bed Wound Margin: Flat and Intact Exudate Amount: Large Exudate Type: Serosanguineous Exudate Color: red, brown Scheid, Fender T. ([id number]) Foul Odor After Cleansing: No Slough/Fibrino Yes Wound Bed Granulation Amount: Medium (34-66%) Exposed Structure Granulation Quality: Red Fascia Exposed: No Necrotic Amount: Medium (34-66%) Fat Layer (Subcutaneous Tissue) Exposed: Yes Necrotic Quality: Eschar, Adherent Slough Tendon Exposed: No Muscle Exposed: No Joint Exposed: No Bone Exposed: No Periwound Skin Texture Texture Color No Abnormalities Noted: No No Abnormalities Noted: No Callus: No Atrophie Blanche: No Crepitus: No Cyanosis: No Excoriation: No Ecchymosis: No Induration: No Erythema: Yes Rash: No Erythema Location: Circumferential Scarring: Yes Hemosiderin Staining: No Mottled: No Moisture Pallor: No No Abnormalities Noted:  No Rubor: No Dry / Scaly: No Maceration: No Temperature / Pain Temperature: No Abnormality Tenderness on Palpation: Yes Wound Preparation Ulcer Cleansing: Rinsed/Irrigated with Saline Topical Anesthetic Applied: Other: Lidocaine 4%, Assessment Notes anterior and medial wound connect Treatment Notes Wound #2 (Right, Medial Knee) 1. Cleansed with: Clean wound with Normal Saline 2. Anesthetic Topical Lidocaine 4% cream to wound bed prior to debridement 3. Peri-wound Care: Skin Prep 4. Dressing Applied: Aquacel Ag 5. Secondary Dressing Applied Bordered Foam Dressing Dry  Gauze Electronic Signature(s) KASIM, MCCORKLE ([id number]) Signed: 09/07/2016 4:31:30 PM By: Alric Quan Entered By: Alric Quan on 09/07/2016 13:09:49 Mumby, [name] ([id number]) -------------------------------------------------------------------------------- Wound Assessment Details Patient Name: [name] Gathers T. Date of Service: 09/07/2016 12:30 PM Medical Record Number: [id number] Patient Account Number: [id number] Date of Birth/Sex: [birth year] (71 y.o. Male) Treating RN: Ahmed Prima Primary Care Abayomi Pattison: [name] Dupre Other Clinician: Referring Demetrice Combes: [name] Dupre Treating Michaelyn Wall/Extender: Frann Rider in Treatment: 3 Wound Status Wound Number: 3 Primary Diabetic Wound/Ulcer of the Lower Etiology: Extremity Wound Location: Right Knee - Lateral Wound Open Wounding Event: Pressure Injury Status: Date Acquired: 07/23/2016 Comorbid Anemia, Asthma, Arrhythmia, Weeks Of Treatment: 3 History: Congestive Heart Failure, Coronary Clustered Wound: No Artery Disease, Hypertension, Type II Diabetes, Osteoarthritis Photos Photo Uploaded By: Alric Quan on 09/07/2016 16:27:30 Wound Measurements Length: (cm) 0.2 Width: (cm) 0.9 Depth: (cm) 0.1 Area: (cm) 0.141 Volume: (cm) 0.014 % Reduction in Area: 97.5% % Reduction in Volume: 97.5% Epithelialization: None Tunneling: No Undermining: No Wound Description Classification: Grade 1 Foul Odor Aft Wound Margin: Distinct, outline attached Slough/Fibrin Exudate Amount: Large Exudate Type: Serosanguineous Exudate Color: red, brown er Cleansing: No o Yes Wound Bed Granulation Amount: Large (67-100%) Exposed Structure Granulation Quality: Pink, Pale Fascia Exposed: No Necrotic Amount: Small (1-33%) Fat Layer (Subcutaneous Tissue) Exposed: Yes Thalman, Bruk T. ([id number]) Necrotic Quality: Adherent Slough Tendon Exposed: No Muscle Exposed: No Joint Exposed: No Bone Exposed:  No Periwound Skin Texture Texture Color No Abnormalities Noted: No No Abnormalities Noted: No Callus: No Atrophie Blanche: No Crepitus: No Cyanosis: No Excoriation: No Ecchymosis: No Induration: No Erythema: Yes Rash: No Erythema Location: Circumferential Scarring: No Hemosiderin Staining: No Mottled: No Moisture Pallor: No No Abnormalities Noted: No Rubor: No Dry / Scaly: No Maceration: No Temperature / Pain Temperature: No Abnormality Tenderness on Palpation: Yes Wound Preparation Ulcer Cleansing: Rinsed/Irrigated with Saline Topical Anesthetic Applied: Other: lidocain 4%, Treatment Notes Wound #3 (Right, Lateral Knee) 1. Cleansed with: Clean wound with Normal Saline 2. Anesthetic Topical Lidocaine 4% cream to wound bed prior to debridement 3. Peri-wound Care: Skin Prep 4. Dressing Applied: Aquacel Ag 5. Secondary Dressing Applied Bordered Foam Dressing Dry Gauze Electronic Signature(s) Signed: 09/07/2016 4:31:30 PM By: Alric Quan Entered By: Alric Quan on 09/07/2016 13:08:02 [place], [name] ([id number]) -------------------------------------------------------------------------------- [place] Details Patient Name: [name] Gathers T. Date of Service: 09/07/2016 12:30 PM Medical Record Number: [id number] Patient Account Number: [id number] Date of Birth/Sex: 06/11/1945 (71 y.o. Male) Treating RN: Ahmed Prima Primary Care Kristy Schomburg: [name] Dupre Other Clinician: Referring Blimie Vaness: [name] Dupre Treating Labrandon Knoch/Extender: Frann Rider in Treatment: 3 Vital Signs Time Taken: 12:59 Temperature (F): 98.5 Height (in): 67 Pulse (bpm): 62 Weight (lbs): 357 Respiratory Rate (breaths/min): 18 Body Mass Index (BMI): 55.9 Blood Pressure (mmHg): 110/72 Reference Range: 80 - 120 mg / dl Electronic Signature(s) Signed: 09/07/2016 4:31:30 PM By: Alric Quan Entered By: Alric Quan on 09/07/2016 13:00:05

## 2016-09-14 ENCOUNTER — Encounter: Payer: Medicare Other | Admitting: Surgery

## 2016-09-14 DIAGNOSIS — E11622 Type 2 diabetes mellitus with other skin ulcer: Secondary | ICD-10-CM | POA: Diagnosis not present

## 2016-09-14 NOTE — Progress Notes (Signed)
JAMS, TRICKETT ([id number]) Visit Report for 09/14/2016 Chief Complaint Document Details Patient Name: [name] [name] PECKHAM. Date of Service: 09/14/2016 12:30 PM Medical Record Number: [id number] Patient Account Number: [id number] Date of Birth/Sex: Jan 25, 1946 (71 y.o. Male) Treating RN: [name] [name] Primary Care Provider: [name] [name] Other Clinician: Referring Provider: [name] [name] Treating Provider/Extender: [name] [name] in Treatment: 4 Information Obtained from: Patient Chief Complaint Patient is at the clinic for treatment of an open pressure ulcer to the area of the right knee for about 3 weeks now Electronic Signature(s) Signed: 09/14/2016 1:51:36 PM By: [name] Fudge MD, FACS Entered By: [name] [name] on 09/14/2016 13:51:36 [name] [name] ([id number]) -------------------------------------------------------------------------------- Debridement Details Patient Name: [name] [name] T. Date of Service: 09/14/2016 12:30 PM Medical Record Number: [id number] Patient Account Number: [id number] Date of Birth/Sex: 02-12-1946 (71 y.o. Male) Treating RN: [name] [name] Primary Care Provider: [name] [name] Other Clinician: Referring Provider: [name] [name] Treating Provider/Extender: [name] [name] in Treatment: 4 Debridement Performed for Wound #1 Right,Anterior Knee Assessment: Performed By: Physician [name] Fudge, MD Debridement: Debridement Pre-procedure Yes - 13:09 Verification/Time Out Taken: Start Time: 13:10 Pain Control: Lidocaine 4% Topical Solution Level: Skin/Subcutaneous Tissue Total Area Debrided (L x 0.3 (cm) x 1 (cm) = 0.3 (cm) W): Tissue and other Viable, Non-Viable, Subcutaneous material debrided: Instrument: Forceps, Scissors Bleeding: Moderate Hemostasis Achieved: Silver Nitrate End Time: 13:11 Procedural Pain: 0 Post Procedural Pain: 0 Response to Treatment: Procedure was tolerated well Post Debridement Measurements  of Total Wound Length: (cm) 0.3 Width: (cm) 0.3 Depth: (cm) 0.7 Volume: (cm) 0.049 Character of Wound/Ulcer Post Requires Further Debridement Debridement: Severity of Tissue Post Debridement: Fat layer exposed Post Procedure Diagnosis Same as Pre-procedure Electronic Signature(s) Signed: 09/14/2016 1:51:28 PM By: [name] Fudge MD, FACS Signed: 09/14/2016 4:14:56 PM By: Alric Quan Entered By: [name] [name] on 09/14/2016 13:51:28 [name] [name] ([id number]) [name] [name] T. ([id number]) -------------------------------------------------------------------------------- HPI Details Patient Name: [name] [name] T. Date of Service: 09/14/2016 12:30 PM Medical Record Number: [id number] Patient Account Number: [id number] Date of Birth/Sex: [birth year] (71 y.o. Male) Treating RN: [name] [name] Primary Care Provider: [name] [name] Other Clinician: Referring Provider: [name] [name] Treating Provider/Extender: [name] [name] in Treatment: 4 History of Present Illness Location: right knee swelling with skin ulceration Quality: Patient reports experiencing a dull pain to affected area(s). Severity: Patient states wound are getting worse. Duration: Patient has had the wound for <4 weeks prior to presenting for treatment Timing: Pain in wound is Intermittent (comes and goes Context: The wound occurred when the patient had a cast placed over his right lower extremity and with the cast was removed he had a swelling of the knee with problems with his skin Modifying Factors: Other treatment(s) tried include:orthopedics Associated Signs and Symptoms: swelling of both lower extremities with large amount of swelling of the knee HPI Description: 71 year old gentleman who has been living in an independent living facility for long-term care has had recurrent falls and recently had a large hematoma on the right knee. Known to be on aspirin and Plavix for history of CABG and was seen  in the ER at the end of February after a fall. past medical history of arthritis, congestive heart failure, diabetes mellitus, hypertension, status post CABG o2, status post cholecystectomy, hernia repair and kidney surgery. recent workup for the fall showed negative clinical findings in his extremities and it was thought that he was falling due to deconditioning. a x-ray showed a right fibular neck fracture and was placed in a splint. he was  asked to be seen by orthopedic surgeon. the patient cannot tell me which orthopedic doctor had seen him and I'm not able to get any notes from [place], regarding his orthopedic care. The patient says twice during this last week he has had bloody fluid shooting out of his knee and has drained all over his boot and the flow. 08/21/2016 -- he was seen by the nurse practitioner at the facility and and distend she aspirated about 60 mL of hemorrhagic fluid from his right knee. 08/31/2016 -- since I saw the patient last he was admitted to the hospital at [facility] between 08/23/2016 2 08/27/2016 and was treated with a pulmonary embolism. he had EKOS complete, was started on Xarelto, and also treated for chronic diastolic congestive heart failure. he was asked to follow-up with the wound center and continue management locally. 09/07/2016 -- he has an appointment to see his orthopedic surgeon later this week. 09/14/2016 -- he did see the orthopedic service and they have asked him to continue with physical therapy. They discontinued the boot and the x-ray done showed a healed fracture. They said it was okay to use a wound VAC if needed Electronic Signature(s) Signed: 09/14/2016 1:52:58 PM By: [name] Fudge MD, FACS Entered By: [name] [name] on 09/14/2016 13:52:58 [name] [name] ([id number]) [name] [name] ([id number]) -------------------------------------------------------------------------------- Physical Exam Details Patient Name: [name] [name] T. Date of  Service: 09/14/2016 12:30 PM Medical Record Number: [id number] Patient Account Number: [id number] Date of Birth/Sex: [birth year] (71 y.o. Male) Treating RN: [name] [name] Primary Care Provider: [name] [name] Other Clinician: Referring Provider: [name] [name] Treating Provider/Extender: [name] [name] in Treatment: 4 Constitutional . Pulse regular. Respirations normal and unlabored. Afebrile. . Eyes Nonicteric. Reactive to light. Ears, Nose, Mouth, and Throat Lips, teeth, and gums WNL.[name] Moist mucosa without lesions. Neck supple and nontender. No palpable supraclavicular or cervical adenopathy. Normal sized without goiter. Respiratory WNL. No retractions.. Breath sounds WNL, No rubs, rales, rhonchi, or wheeze.. Cardiovascular Heart rhythm and rate regular, no murmur or gallop.. Pedal Pulses WNL. No clubbing, cyanosis or edema. Chest Breasts symmetical and no nipple discharge.. Breast tissue WNL, no masses, lumps, or tenderness.. Lymphatic No adneopathy. No adenopathy. No adenopathy. Musculoskeletal Adexa without tenderness or enlargement.. Digits and nails w/o clubbing, cyanosis, infection, petechiae, ischemia, or inflammatory conditions.. Integumentary (Hair, Skin) No suspicious lesions. No crepitus or fluctuance. No peri-wound warmth or erythema. No masses.[name] Psychiatric Judgement and insight Intact.. No evidence of depression, anxiety, or agitation.. Notes there is a small skin bridge in the area which is partially necrotic and using a forcep and scissors sharply remove the skin bridge so that this entire wound is now one. Bleeding controlled with Silver nitrate. The base of the wound is fairly clean, and the undermining towards the 9:00 position a significant Electronic Signature(s) Signed: 09/14/2016 1:53:56 PM By: [name] Fudge MD, FACS Entered By: [name] [name] on 09/14/2016 13:53:55 Dwan, [name] [name]  ([id number]) -------------------------------------------------------------------------------- Physician Orders Details Patient Name: [name] [name] T. Date of Service: 09/14/2016 12:30 PM Medical Record Number: [id number] Patient Account Number: [id number] Date of Birth/Sex: Sep 26, 1945 (71 y.o. Male) Treating RN: [name] [name] Primary Care Provider: [name] [name] Other Clinician: Referring Provider: [name] [name] Treating Provider/Extender: [name] [name] in Treatment: 4 Verbal / Phone Orders: Yes Clinician: Pinkerton, Debi Read Back and Verified: Yes Diagnosis Coding Wound Cleansing Wound #1 Right,Anterior Knee o Clean wound with Normal Saline. o Cleanse wound with mild soap and water Anesthetic Wound #1 Right,Anterior Knee o Topical Lidocaine 4% cream  applied to wound bed prior to debridement - Only in the Wound Clinic Skin Barriers/Peri-Wound Care Wound #1 Right,Anterior Knee o Skin Prep o Moisturizing lotion - right lower leg below knee no where near wound Primary Wound Dressing Wound #1 Right,Anterior Knee o Aquacel Ag - rope until wound vac is placed by facility Secondary Dressing Wound #1 Right,Anterior Knee o ABD pad - until wound vac is placed by facility o Dry Gauze - until wound vac is placed by facility Dressing Change Frequency Wound #1 Right,Anterior Knee o Change dressing every day. - until wound vac is placed by facility Follow-up Appointments Wound #1 Right,Anterior Knee o Return Appointment in 1 week. Edema Control o Elevate legs to the level of the heart and pump ankles as often as possible [name] [name] T. ([id number]) Additional Orders / Instructions Wound #1 Right,Anterior Knee o Increase protein intake. o Activity as tolerated Negative Pressure Wound Therapy Wound #1 Right,Anterior Knee o Wound VAC settings at 125/130 mmHg continuous pressure. Use BLACK/GREEN foam to wound cavity. Use WHITE foam to fill  any tunnel/s and/or undermining. Change VAC dressing 3 X WEEK. Change canister as indicated when full. Nurse may titrate settings and frequency of dressing changes as clinically indicated. - to be started by [facility] as soon as possible white foam into the tunnel from 12 o'clock to 6 o'clock then black foam to the top make sure the skin is draped correctly o Number of foam/gauze pieces used in the dressing = Electronic Signature(s) Signed: 09/14/2016 3:55:33 PM By: [name] Fudge MD, FACS Signed: 09/14/2016 4:14:56 PM By: Alric Quan Entered By: Alric Quan on 09/14/2016 13:30:23 Gehring, [name] [name] ([id number]) -------------------------------------------------------------------------------- Problem List Details Patient Name: [name] [name] T. Date of Service: 09/14/2016 12:30 PM Medical Record Number: [id number] Patient Account Number: [id number] Date of Birth/Sex: 12/25/1945 (71 y.o. 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, right knee 08/14/2016 Yes L97.812 Non-pressure chronic ulcer of other part of right lower leg 08/14/2016 Yes with fat layer exposed L89.892 Pressure ulcer of other site, stage 2 08/14/2016 Yes M70.861 Other soft tissue disorders related to use, overuse and 08/14/2016 Yes pressure, right lower leg Inactive Problems Resolved Problems Electronic Signature(s) Signed: 09/14/2016 1:50:49 PM By: [name] Fudge MD, FACS Entered By: [name] [name] on 09/14/2016 13:50:49 Starks, [name] [name] ([id number]) -------------------------------------------------------------------------------- Progress Note Details Patient Name: [name] [name]  T. Date of Service: 09/14/2016 12:30 PM Medical Record Number: [id number] Patient Account Number: [id number] Date of Birth/Sex: December 19, 1945 (71 y.o. Male) Treating RN: [name] [name] Primary Care Provider: [name] [name] Other Clinician: Referring Provider: [name] [name] Treating Provider/Extender: [name] [name] in Treatment: 4 Subjective Chief Complaint Information obtained from Patient Patient is at the clinic for treatment of an open pressure ulcer to the area of the right knee for about 3 weeks now History of Present Illness (HPI) The following HPI elements were documented for the patient's wound: Location: right knee swelling with skin ulceration Quality: Patient reports experiencing a dull pain to affected area(s). Severity: Patient states wound are getting worse. Duration: Patient has had the wound for Timing: Pain in wound is Intermittent (comes and goes Context: The wound occurred when the patient had a cast placed over his right lower extremity and with the cast was removed he had a swelling  of the knee with problems with his skin Modifying Factors: Other treatment(s) tried include:orthopedics Associated Signs and Symptoms: swelling of both lower extremities with large amount of swelling of the knee 70 year old gentleman who has been living in an independent living facility for long-term care has had recurrent falls and recently had a large hematoma on the right knee. Known to be on aspirin and Plavix for history of CABG and was seen in the ER at the end of February after a fall. past medical history of arthritis, congestive heart failure, diabetes mellitus, hypertension, status post CABG o2, status post cholecystectomy, hernia repair and kidney surgery. recent workup for the fall showed negative clinical findings in his extremities and it was thought that he was falling due to deconditioning. a x-ray showed a right fibular neck fracture and was placed in a  splint. he was asked to be seen by orthopedic surgeon. the patient cannot tell me which orthopedic doctor had seen him and I'm not able to get any notes from [place], regarding his orthopedic care. The patient says twice during this last week he has had bloody fluid shooting out of his knee and has drained all over his boot and the flow. 08/21/2016 -- he was seen by the nurse practitioner at the facility and and distend she aspirated about 60 mL of hemorrhagic fluid from his right knee. 08/31/2016 -- since I saw the patient last he was admitted to the hospital at [facility] between 08/23/2016 2 08/27/2016 and was treated with a pulmonary embolism. he had EKOS complete, was started on Xarelto, and also treated for chronic diastolic congestive heart failure. he was asked to follow-up with the wound center and continue management locally. 09/07/2016 -- he has an appointment to see his orthopedic surgeon later this week. 09/14/2016 -- he did see the orthopedic service and they have asked him to continue with physical therapy. DESHUN, SEDIVY ([id number]) They discontinued the boot and the x-ray done showed a healed fracture. They said it was okay to use a wound VAC if needed Objective Constitutional Pulse regular. Respirations normal and unlabored. Afebrile. Vitals Time Taken: 12:40 PM, Height: 67 in, Weight: 357 lbs, BMI: 55.9, Temperature: 99.5 F, Pulse: 94 bpm, Respiratory Rate: 18 breaths/min, Blood Pressure: 121/61 mmHg. Eyes Nonicteric. Reactive to light. Ears, Nose, Mouth, and Throat Lips, teeth, and gums WNL.[name] Moist mucosa without lesions. Neck supple and nontender. No palpable supraclavicular or cervical adenopathy. Normal sized without goiter. Respiratory WNL. No retractions.. Breath sounds WNL, No rubs, rales, rhonchi, or wheeze.. Cardiovascular Heart rhythm and rate regular, no murmur or gallop.. Pedal Pulses WNL. No clubbing, cyanosis or edema. Chest Breasts symmetical and no  nipple discharge.. Breast tissue WNL, no masses, lumps, or tenderness.. Lymphatic No adneopathy. No adenopathy. No adenopathy. Musculoskeletal Adexa without tenderness or enlargement.. Digits and nails w/o clubbing, cyanosis, infection, petechiae, ischemia, or inflammatory conditions.[name] Psychiatric Judgement and insight Intact.. No evidence of depression, anxiety, or agitation.. General Notes: there is a small skin bridge in the area which is partially necrotic and using a forcep and scissors sharply remove the skin bridge so that this entire wound is now one. Bleeding controlled with Silver nitrate. The base of the wound is fairly clean, and the undermining towards the 9:00 position a significant [name] [name] T. ([id number]) Integumentary (Hair, Skin) No suspicious lesions. No crepitus or fluctuance. No peri-wound warmth or erythema. No masses.. Wound #1 status is Open. Original cause of wound was Pressure Injury. The wound is located on the  Right,Anterior Knee. The wound measures 1.3cm length x 4.4cm width x 0.8cm depth; 4.492cm^2 area and 3.594cm^3 volume. There is Fat Layer (Subcutaneous Tissue) Exposed exposed. There is no undermining noted, however, there is tunneling at 9:00 with a [name] distance of 5.4cm. There is a large amount of serosanguineous drainage noted. The wound margin is flat and intact. There is large (67-100%) granulation within the wound bed. There is a small (1-33%) amount of necrotic tissue within the wound bed including Adherent Slough. The periwound skin appearance exhibited: Erythema. The periwound skin appearance did not exhibit: Callus, Crepitus, Excoriation, Induration, Rash, Scarring, Dry/Scaly, Maceration, Atrophie Blanche, Cyanosis, Ecchymosis, Hemosiderin Staining, Mottled, Pallor, Rubor. The surrounding wound skin color is noted with erythema which is circumferential. Periwound temperature was noted as No Abnormality. The periwound has tenderness on  palpation. General Notes: arterial and medial wound are communicating Wound #3 status is Open. Original cause of wound was Pressure Injury. The wound is located on the Right,Lateral Knee. The wound measures 0cm length x 0cm width x 0cm depth; 0cm^2 area and 0cm^3 volume. There is Fat Layer (Subcutaneous Tissue) Exposed exposed. There is no tunneling or undermining noted. There is a none present amount of drainage noted. The wound margin is distinct with the outline attached to the wound base. There is no granulation within the wound bed. There is no necrotic tissue within the wound bed. The periwound skin appearance did not exhibit: Callus, Crepitus, Excoriation, Induration, Rash, Scarring, Dry/Scaly, Maceration, Atrophie Blanche, Cyanosis, Ecchymosis, Hemosiderin Staining, Mottled, Pallor, Rubor, Erythema. Periwound temperature was noted as No Abnormality. The periwound has tenderness on palpation. 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 There was a Skin/Subcutaneous Tissue Debridement ([id number]) debridement with total area of 0.3 sq cm [name] [name] T. ([id number]) performed by [name] Fudge, MD. with the following instrument(s): Forceps and Scissors to remove Viable and Non-Viable tissue/material including Subcutaneous after achieving pain control using Lidocaine 4% Topical Solution. A time out was conducted at 13:09, prior to the start of the procedure. A Moderate amount of bleeding was controlled with Silver Nitrate. The procedure was tolerated  well with a pain level of 0 throughout and a pain level of 0 following the procedure. Post Debridement Measurements: 0.3cm length x 0.3cm width x 0.7cm depth; 0.049cm^3 volume. Character of Wound/Ulcer Post Debridement requires further debridement. Severity of Tissue Post Debridement is: Fat layer exposed. Post procedure Diagnosis Wound #1: Same as Pre-Procedure Plan Wound Cleansing: Wound #1 Right,Anterior Knee: Clean wound with Normal Saline. Cleanse wound with mild soap and water Anesthetic: Wound #1 Right,Anterior Knee: Topical Lidocaine 4% cream applied to wound bed prior to debridement - Only in the Wound Clinic Skin Barriers/Peri-Wound Care: Wound #1 Right,Anterior Knee: Skin Prep Moisturizing lotion - right lower leg below knee no where near wound Primary Wound Dressing: Wound #1 Right,Anterior Knee: Aquacel Ag - rope until wound vac is placed by facility Secondary Dressing: Wound #1 Right,Anterior Knee: ABD pad - until wound vac is placed by facility Dry Gauze - until wound vac is placed by facility Dressing Change Frequency: Wound #1 Right,Anterior Knee:  Change dressing every day. - until wound vac is placed by facility Follow-up Appointments: Wound #1 Right,Anterior Knee: Return Appointment in 1 week. Edema Control: Elevate legs to the level of the heart and pump ankles as often as possible Additional Orders / Instructions: Wound #1 Right,Anterior Knee: Increase protein intake. Activity as tolerated Negative Pressure Wound Therapy: Wound #1 Right,Anterior Knee: Wound VAC settings at 125/130 mmHg continuous pressure. Use BLACK/GREEN foam to wound cavity. [name] ([id number]) Use WHITE foam to fill any tunnel/s and/or undermining. Change VAC dressing 3 X WEEK. Change canister as indicated when full. Nurse may titrate settings and frequency of dressing changes as clinically indicated. - to be started by [facility] as soon as possible white foam  into the tunnel from 12 o'clock to 6 o'clock then black foam to the top make sure the skin is draped correctly Number of foam/gauze pieces used in the dressing = after review today I have recommended: 1. Silver alginate rope to be packed into the wounds of the right knee and covered with a light Kerlix bandage. 2. he has seen his orthopedic surgeon regarding further care and I have reviewed the note regarding his fracture 3. If the orthopedic surgeon has no objection and hence we would recommend a wound VAC application to this knee --to be changed 3 times a week 4. Review back at the wound center next week Notes addendum we got the note from the orthopedic office where he was seen by [facility] in [place] and was seen by the PA Carlynn Spry. -- The x-ray which was done was reviewed and there is good healing of the fracture and from the orthopedic point of view he has healed well and they have asked him to continue with good control of his diabetes and management by the wound center including using a wound VAC if required. Electronic Signature(s) Signed: 09/14/2016 3:24:23 PM By: [name] Fudge MD, FACS Previous Signature: 09/14/2016 1:55:12 PM Version By: [name] Fudge MD, FACS Entered By: [name] [name] on 09/14/2016 15:24:15 Schirtzinger, [name] [name] ([id number]) -------------------------------------------------------------------------------- SuperBill Details Patient Name: [name] [name] T. Date of Service: 09/14/2016 Medical Record Number: [id number] Patient Account Number: [id number] Date of Birth/Sex: [birth year] (71 y.o. Male) Treating RN: [name] [name] Primary Care Provider: [name] [name] Other Clinician: Referring Provider: [name] [name] Treating Provider/Extender: [name] [name] in Treatment: 4 Diagnosis Coding ICD-10 Codes Code Description [id number] 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, right  knee L97.812 Non-pressure chronic ulcer of other part of right lower leg with fat layer exposed L89.892 Pressure ulcer of other site, stage 2 M70.861 Other soft tissue disorders related to use, overuse and pressure, right lower leg Facility Procedures CPT4: Description Modifier Quantity Code [id number] 11042 - DEB SUBQ TISSUE 20 SQ CM/< 1 ICD-10 Description Diagnosis 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, right knee L97.812 Non-pressure chronic ulcer of  other part of right lower leg with fat layer exposed L89.892 Pressure ulcer of other site, stage 2 Physician Procedures CPT4: Description Modifier Quantity Code [id number] - WC PHYS LEVEL 3 - EST PT 25 1 ICD-10 Description Diagnosis 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

## 2016-09-15 NOTE — Progress Notes (Signed)
KHOA, OPDAHL ([id number]) Visit Report for 09/14/2016 Arrival Information Details Patient Name: [name], [name]. Date of Service: 09/14/2016 12:30 PM Medical Record Number: [id number] Patient Account Number: [id number] Date of Birth/Sex: 04-29-1946 (71 y.o. Male) Treating RN: Ahmed Prima Primary Care Myer Bohlman: [name] Dupre Other Clinician: Referring Tymar Polyak: [name] Dupre Treating Pranish Akhavan/Extender: Frann Rider in Treatment: 4 Visit Information History Since Last Visit All ordered tests and consults were completed: No Patient Arrived: Wheel Chair Added or deleted any medications: No Arrival Time: 12:38 Any new allergies or adverse reactions: No Accompanied By: caregiver Had a fall or experienced change in No activities of daily living that may affect Transfer Assistance: None risk of falls: Patient Identification Verified: Yes Signs or symptoms of abuse/neglect since last No Secondary Verification Process Yes visito Completed: Hospitalized since last visit: No Patient Requires Transmission-Based No Has Dressing in Place as Prescribed: Yes Precautions: Pain Present Now: No Patient Has Alerts: No Electronic Signature(s) Signed: 09/14/2016 4:14:56 PM By: Alric Quan Entered By: Alric Quan on 09/14/2016 12:39:32 [name], [name] ([id number]) -------------------------------------------------------------------------------- Encounter Discharge Information Details Patient Name: [name] Gathers T. Date of Service: 09/14/2016 12:30 PM Medical Record Number: [id number] Patient Account Number: [id number] Date of Birth/Sex: 1946-05-10 (71 y.o. Male) Treating RN: Ahmed Prima Primary Care Marigene Erler: [name] Dupre Other Clinician: Referring Areta Terwilliger: [name] Dupre Treating Jourdan Durbin/Extender: Frann Rider in Treatment: 4 Encounter Discharge Information Items Discharge Pain Level: 0 Discharge Condition: Stable Ambulatory Status:  Wheelchair Nursing Discharge Destination: Home Transportation: Private Auto Accompanied By: caregiver Schedule Follow-up Appointment: Yes Medication Reconciliation completed No and provided to Patient/Care Helvi Royals: Clinical Summary of Care: Electronic Signature(s) Signed: 09/14/2016 4:30:12 PM By: Alric Quan Entered By: Alric Quan on 09/14/2016 16:29:10 [name], [name] ([id number]) -------------------------------------------------------------------------------- Lower Extremity Assessment Details Patient Name: [name] Gathers T. Date of Service: 09/14/2016 12:30 PM Medical Record Number: [id number] Patient Account Number: [id number] Date of Birth/Sex: [birth year] (71 y.o. Male) Treating RN: Ahmed Prima Primary Care Allexa Acoff: [name] Dupre Other Clinician: Referring Hobert Poplaski: [name] Dupre Treating Denzal Meir/Extender: Frann Rider in Treatment: 4 Vascular Assessment Pulses: Dorsalis Pedis Palpable: [Right:Yes] Posterior Tibial Extremity colors, hair growth, and conditions: Extremity Color: [Right:Normal] Temperature of Extremity: [Right:Warm] Capillary Refill: [Right:< 3 seconds] Electronic Signature(s) Signed: 09/14/2016 4:14:56 PM By: Alric Quan Entered By: Alric Quan on 09/14/2016 12:52:51 [name], [name] ([id number]) -------------------------------------------------------------------------------- Multi Wound Chart Details Patient Name: [name] Gathers T. Date of Service: 09/14/2016 12:30 PM Medical Record Number: [id number] Patient Account Number: [id number] Date of Birth/Sex: 02-Feb-1946 (71 y.o. Male) Treating RN: Ahmed Prima Primary Care Khalessi Blough: [name] Dupre Other Clinician: Referring Janelli Welling: [name] Dupre Treating Shayanna Thatch/Extender: Frann Rider in Treatment: 4 Vital Signs Height(in): 67 Pulse(bpm): 94 Weight(lbs): 357 Blood Pressure 121/61 (mmHg): Body Mass Index(BMI): 56 Temperature(F):  99.5 Respiratory Rate 18 (breaths/min): Photos: [1:No Photos] [3:No Photos] [N/A:N/A] Wound Location: [1:Right, Anterior Knee] [3:Right Knee - Lateral] [N/A:N/A] Wounding Event: [1:Pressure Injury] [3:Pressure Injury] [N/A:N/A] Primary Etiology: [1:Diabetic Wound/Ulcer of the Lower Extremity] [3:Diabetic Wound/Ulcer of the Lower Extremity] [N/A:N/A] Comorbid History: [1:Anemia, Asthma, Arrhythmia, Congestive Heart Failure, Coronary Artery Disease, Hypertension, Type II Diabetes, Osteoarthritis] [3:Anemia, Asthma, Arrhythmia, Congestive Heart Failure, Coronary Artery Disease, Hypertension, Type II  Diabetes, Osteoarthritis] [N/A:N/A] Date Acquired: [1:07/23/2016] [3:07/23/2016] [N/A:N/A] Weeks of Treatment: [1:4] [3:4] [N/A:N/A] Wound Status: [1:Open] [3:Open] [N/A:N/A] Measurements L x W x D 1.3x4.4x0.8 [3:0x0x0] [N/A:N/A] (cm) Area (cm) : [1:4.492] [3:0] [N/A:N/A] Volume (cm) : [1:3.594] [3:0] [N/A:N/A] % Reduction in Area: [1:42.80%] [3:100.00%] [N/A:N/A] % Reduction in Volume: -357.80% [3:100.00%] [N/A:N/A] Position 1 (o'clock):  9 Maximum Distance 1 5.4 (cm): Tunneling: [1:Yes] [3:No] [N/A:N/A] Classification: [1:Grade 1] [3:Grade 1] [N/A:N/A] Exudate Amount: [1:Large] [3:None Present] [N/A:N/A] Exudate Type: [1:Serosanguineous] [3:N/A] [N/A:N/A] Exudate Color: [1:red, brown] [3:N/A] [N/A:N/A] Wound Margin: [1:Flat and Intact] [3:Distinct, outline attached] [N/A:N/A] Granulation Amount: [1:Large (67-100%)] [3:None Present (0%)] [N/A:N/A] Necrotic Amount: Small (1-33%) None Present (0%) N/A Exposed Structures: Fat Layer (Subcutaneous Fat Layer (Subcutaneous N/A Tissue) Exposed: Yes Tissue) Exposed: Yes Fascia: No Fascia: No Tendon: No Tendon: No Muscle: No Muscle: No Joint: No Joint: No Bone: No Bone: No Epithelialization: None Large (67-100%) N/A Debridement: Debridement (13244- N/A N/A 11047) Pre-procedure 13:09 N/A N/A Verification/Time Out Taken: Pain Control:  Lidocaine 4% Topical N/A N/A Solution Tissue Debrided: Subcutaneous N/A N/A Level: Skin/Subcutaneous N/A N/A Tissue Debridement Area (sq 0.09 N/A N/A cm): Instrument: Forceps, Scissors N/A N/A Bleeding: Moderate N/A N/A Hemostasis Achieved: Silver Nitrate N/A N/A Procedural Pain: 0 N/A N/A Post Procedural Pain: 0 N/A N/A Debridement Treatment Procedure was tolerated N/A N/A Response: well Post Debridement 0.3x0.3x0.7 N/A N/A Measurements L x W x D (cm) Post Debridement 0.049 N/A N/A Volume: (cm) Periwound Skin Texture: Excoriation: No Excoriation: No N/A Induration: No Induration: No Callus: No Callus: No Crepitus: No Crepitus: No Rash: No Rash: No Scarring: No Scarring: No Periwound Skin Maceration: No Maceration: No N/A Moisture: Dry/Scaly: No Dry/Scaly: No Periwound Skin Color: Erythema: Yes Atrophie Blanche: No N/A Atrophie Blanche: No Cyanosis: No Cyanosis: No Ecchymosis: No Ecchymosis: No Erythema: No Hemosiderin Staining: No Hemosiderin Staining: No Mottled: No Mottled: No Pallor: No Pallor: No Rubor: No Rubor: No Erythema Location: Circumferential N/A N/A Mccully, Korin T. ([id number]) Temperature: No Abnormality No Abnormality N/A Tenderness on Yes Yes N/A Palpation: Wound Preparation: Ulcer Cleansing: Ulcer Cleansing: N/A Rinsed/Irrigated with Rinsed/Irrigated with Saline Saline Topical Anesthetic Topical Anesthetic Applied: Other: Lidocaine Applied: None 4% Assessment Notes: arterial and medial wound N/A N/A are communicating Procedures Performed: Debridement N/A N/A Treatment Notes Electronic Signature(s) Signed: 09/14/2016 1:51:03 PM By: Christin Fudge MD, FACS Entered By: Christin Fudge on 09/14/2016 13:51:03 [name], [name] ([id number]) -------------------------------------------------------------------------------- [place]alee Details Patient Name: [name] Gathers T. Date of Service: 09/14/2016 12:30 PM Medical  Record Number: [id number] Patient Account Number: [id number] Date of Birth/Sex: 21-Nov-1945 (71 y.o. Male) Treating RN: Carolyne Fiscal, Debi Primary Care Brienne Liguori: [name] Dupre Other Clinician: Referring Letanya Froh: [name] Dupre Treating Katianna Mcclenney/Extender: Frann Rider in Treatment: 4 Active Inactive ` Orientation to the Wound Care Program Nursing Diagnoses: Knowledge deficit related to the wound healing center program Goals: Patient/caregiver will verbalize understanding of the [facility] Date Initiated: 08/14/2016 Target Resolution Date: 12/14/2016 Goal Status: Active Interventions: Provide education on orientation to the wound center Notes: ` Pressure Nursing Diagnoses: Knowledge deficit related to causes and risk factors for pressure ulcer development Knowledge deficit related to management of pressures ulcers Potential for impaired tissue integrity related to pressure, friction, moisture, and shear Goals: Patient will remain free from development of additional pressure ulcers Date Initiated: 08/14/2016 Target Resolution Date: 12/14/2016 Goal Status: Active Patient will remain free of pressure ulcers Date Initiated: 08/14/2016 Target Resolution Date: 12/14/2016 Goal Status: Active Patient/caregiver will verbalize risk factors for pressure ulcer development Date Initiated: 08/14/2016 Target Resolution Date: 12/14/2016 Goal Status: Active Patient/caregiver will verbalize understanding of pressure ulcer management Date Initiated: 08/14/2016 Target Resolution Date: 12/14/2016 Goal Status: Active EARNIE, BECHARD T. ([id number]) Interventions: Assess: immobility, friction, shearing, incontinence upon admission and as needed Assess offloading mechanisms upon admission and as needed Assess potential for pressure ulcer upon  admission and as needed Provide education on pressure ulcers Treatment Activities: Patient referred for seating evaluation to ensure  proper offloading : 08/14/2016 Pressure reduction/relief device ordered : 08/14/2016 Notes: ` Wound/Skin Impairment Nursing Diagnoses: Impaired tissue integrity Knowledge deficit related to ulceration/compromised skin integrity Goals: Patient/caregiver will verbalize understanding of skin care regimen Date Initiated: 08/14/2016 Target Resolution Date: 12/14/2016 Goal Status: Active Ulcer/skin breakdown will have a volume reduction of 30% by week 4 Date Initiated: 08/14/2016 Target Resolution Date: 12/14/2016 Goal Status: Active Ulcer/skin breakdown will have a volume reduction of 50% by week 8 Date Initiated: 08/14/2016 Target Resolution Date: 12/14/2016 Goal Status: Active Ulcer/skin breakdown will have a volume reduction of 80% by week 12 Date Initiated: 08/14/2016 Target Resolution Date: 12/14/2016 Goal Status: Active Ulcer/skin breakdown will heal within 14 weeks Date Initiated: 08/14/2016 Target Resolution Date: 12/14/2016 Goal Status: Active Interventions: Assess patient/caregiver ability to obtain necessary supplies Assess patient/caregiver ability to perform ulcer/skin care regimen upon admission and as needed Assess ulceration(s) every visit Provide education on ulcer and skin care Treatment Activities: Referred to DME Amando Ishikawa for dressing supplies : 08/14/2016 Skin care regimen initiated : 08/14/2016 AAHIL, FREDIN ([id number]) Topical wound management initiated : 08/14/2016 Notes: Electronic Signature(s) Signed: 09/14/2016 4:14:56 PM By: Alric Quan Entered By: Alric Quan on 09/14/2016 12:52:58 Staff, [name] ([id number]) -------------------------------------------------------------------------------- Pain Assessment Details Patient Name: [name] Gathers T. Date of Service: 09/14/2016 12:30 PM Medical Record Number: [id number] Patient Account Number: [id number] Date of Birth/Sex: March 19, 1946 (71 y.o. Male) Treating RN: Ahmed Prima Primary Care Keshawn Sundberg:  [name] Dupre Other Clinician: Referring Cythnia Osmun: [name] Dupre Treating Calisa Luckenbaugh/Extender: Frann Rider in Treatment: 4 Active Problems Location of Pain Severity and Description of Pain Patient Has Paino No Site Locations With Dressing Change: No Pain Management and Medication Current Pain Management: Electronic Signature(s) Signed: 09/14/2016 4:14:56 PM By: Alric Quan Entered By: Alric Quan on 09/14/2016 12:39:46 [name], [name] ([id number]) -------------------------------------------------------------------------------- Patient/Caregiver Education Details Patient Name: [name] Mule. Date of Service: 09/14/2016 12:30 PM Medical Record Number: [id number] Patient Account Number: [id number] Date of Birth/Gender: 27-Sep-1945 (71 y.o. Male) Treating RN: Ahmed Prima Primary Care Physician: [name] Dupre Other Clinician: Referring Physician: [name] Dupre Treating Physician/Extender: Frann Rider in Treatment: 4 Education Assessment Education Provided To: Patient Education Topics Provided Wound/Skin Impairment: Handouts: Other: change dressing as ordered Methods: Demonstration, Explain/Verbal Responses: State content correctly Electronic Signature(s) Signed: 09/14/2016 4:30:12 PM By: Alric Quan Entered By: Alric Quan on 09/14/2016 16:29:20 [name], [name] ([id number]) -------------------------------------------------------------------------------- Wound Assessment Details Patient Name: [name] Gathers T. Date of Service: 09/14/2016 12:30 PM Medical Record Number: [id number] Patient Account Number: [id number] Date of Birth/Sex: 11/11/1945 (71 y.o. Male) Treating RN: Ahmed Prima Primary Care Melaney Tellefsen: [name] Dupre Other Clinician: Referring Darinda [place]: [name] Dupre Treating Sefora Tietje/Extender: Frann Rider in Treatment: 4 Wound Status Wound Number: 1 Primary Diabetic Wound/Ulcer of the  Lower Etiology: Extremity Wound Location: Right, Anterior Knee Wound Open Wounding Event: Pressure Injury Status: Date Acquired: 07/23/2016 Comorbid Anemia, Asthma, Arrhythmia, Weeks Of Treatment: 4 History: Congestive Heart Failure, Coronary Clustered Wound: No Artery Disease, Hypertension, Type II Diabetes, Osteoarthritis Photos Photo Uploaded By: Alric Quan on 09/14/2016 16:00:37 Wound Measurements Length: (cm) 1.3 % Reduction in Width: (cm) 4.4 % Reduction in Depth: (cm) 0.8 Epithelializati Area: (cm) 4.492 Tunneling: Volume: (cm) 3.594 Position (o Maximum Dist Area: 42.8% Volume: -357.8% on: None Yes 'clock): 9 ance: (cm) 5.4 Undermining: No Wound Description Classification: Grade 1 Wound Margin: Flat and Intact Exudate Amount: Large Exudate Type: Serosanguineous Exudate Color:  red, brown Foul Odor After Cleansing: No Slough/Fibrino Yes Wound Bed [name], [name] ([id number]) Granulation Amount: Large (67-100%) Exposed Structure Necrotic Amount: Small (1-33%) Fascia Exposed: No Necrotic Quality: Adherent Slough Fat Layer (Subcutaneous Tissue) Exposed: Yes Tendon Exposed: No Muscle Exposed: No Joint Exposed: No Bone Exposed: No Periwound Skin Texture Texture Color No Abnormalities Noted: No No Abnormalities Noted: No Callus: No Atrophie Blanche: No Crepitus: No Cyanosis: No Excoriation: No Ecchymosis: No Induration: No Erythema: Yes Rash: No Erythema Location: Circumferential Scarring: No Hemosiderin Staining: No Mottled: No Moisture Pallor: No No Abnormalities Noted: No Rubor: No Dry / Scaly: No Maceration: No Temperature / Pain Temperature: No Abnormality Tenderness on Palpation: Yes Wound Preparation Ulcer Cleansing: Rinsed/Irrigated with Saline Topical Anesthetic Applied: Other: Lidocaine 4%, Assessment Notes arterial and medial wound are communicating Treatment Notes Wound #1 (Right, Anterior Knee) 1. Cleansed  with: Clean wound with Normal Saline 2. Anesthetic Topical Lidocaine 4% cream to wound bed prior to debridement 4. Dressing Applied: Aquacel Ag 5. Secondary Dressing Applied ABD Pad Dry Gauze 7. Secured with Recruitment consultant) Signed: 09/14/2016 4:14:56 PM By: [name] ([id number]) Entered By: Alric Quan on 09/14/2016 13:13:47 [name], [name] ([id number]) -------------------------------------------------------------------------------- Wound Assessment Details Patient Name: [name] Gathers T. Date of Service: 09/14/2016 12:30 PM Medical Record Number: [id number] Patient Account Number: [id number] Date of Birth/Sex: [birth year] (72 y.o. Male) Treating RN: Carolyne Fiscal, Debi Primary Care Aviana Shevlin: [name] Dupre Other Clinician: Referring Emmons Toth: [name] Dupre Treating Shavonn Convey/Extender: Frann Rider in Treatment: 4 Wound Status Wound Number: 3 Primary Diabetic Wound/Ulcer of the Lower Etiology: Extremity Wound Location: Right Knee - Lateral Wound Open Wounding Event: Pressure Injury Status: Date Acquired: 07/23/2016 Comorbid Anemia, Asthma, Arrhythmia, Weeks Of Treatment: 4 History: Congestive Heart Failure, Coronary Clustered Wound: No Artery Disease, Hypertension, Type II Diabetes, Osteoarthritis Photos Photo Uploaded By: Alric Quan on 09/14/2016 16:00:38 Wound Measurements Length: (cm) 0 % Reductio Width: (cm) 0 % Reductio Depth: (cm) 0 Epithelial Area: (cm) 0 Tunneling Volume: (cm) 0 Undermini n in Area: 100% n in Volume: 100% ization: Large (67-100%) : No ng: No Wound Description Classification: Grade 1 Wound Margin: Distinct, outline attached Exudate Amount: None Present Foul Odor After Cleansing: No Slough/Fibrino No Wound Bed Granulation Amount: None Present (0%) Exposed Structure Necrotic Amount: None Present (0%) Fascia Exposed: No Fat Layer (Subcutaneous Tissue) Exposed: Yes Tendon  Exposed: No Muscle Exposed: No [name], [name] ([id number]) Joint Exposed: No Bone Exposed: No Periwound Skin Texture Texture Color No Abnormalities Noted: No No Abnormalities Noted: No Callus: No Atrophie Blanche: No Crepitus: No Cyanosis: No Excoriation: No Ecchymosis: No Induration: No Erythema: No Rash: No Hemosiderin Staining: No Scarring: No Mottled: No Pallor: No Moisture Rubor: No No Abnormalities Noted: No Dry / Scaly: No Temperature / Pain Maceration: No Temperature: No Abnormality Tenderness on Palpation: Yes Wound Preparation Ulcer Cleansing: Rinsed/Irrigated with Saline Topical Anesthetic Applied: None Electronic Signature(s) Signed: 09/14/2016 4:14:56 PM By: Alric Quan Entered By: Alric Quan on 09/14/2016 12:47:28 [name], [name] ([id number]) -------------------------------------------------------------------------------- Vitals Details Patient Name: [name] Gathers T. Date of Service: 09/14/2016 12:30 PM Medical Record Number: [id number] Patient Account Number: [id number] Date of Birth/Sex: Jan 10, 1946 (71 y.o. Male) Treating RN: Ahmed Prima Primary Care [name]: [name] Dupre Other Clinician: Referring Verline Kong: [name] Dupre Treating Durand Wittmeyer/Extender: Frann Rider in Treatment: 4 Vital Signs Time Taken: 12:40 Temperature (F): 99.5 Height (in): 67 Pulse (bpm): 94 Weight (lbs): 357 Respiratory Rate (breaths/min): 18 Body Mass Index (BMI): 55.9 Blood Pressure (mmHg): 121/61 Reference Range:  80 - 120 mg / dl Electronic Signature(s) Signed: 09/14/2016 4:14:56 PM By: Alric Quan Entered By: Alric Quan on 09/14/2016 12:42:23

## 2016-09-21 ENCOUNTER — Emergency Department: Payer: Medicare Other

## 2016-09-21 ENCOUNTER — Inpatient Hospital Stay
Admission: EM | Admit: 2016-09-21 | Discharge: 2016-09-23 | DRG: 602 | Disposition: A | Discharge status: Skilled Nursing Facility | Payer: Medicare Other | Attending: Internal Medicine | Admitting: Internal Medicine

## 2016-09-21 ENCOUNTER — Inpatient Hospital Stay: Payer: Medicare Other

## 2016-09-21 ENCOUNTER — Encounter: Payer: Self-pay | Admitting: Emergency Medicine

## 2016-09-21 ENCOUNTER — Encounter: Payer: Medicare Other | Admitting: Surgery

## 2016-09-21 DIAGNOSIS — L0390 Cellulitis, unspecified: Secondary | ICD-10-CM | POA: Diagnosis present

## 2016-09-21 DIAGNOSIS — I2510 Atherosclerotic heart disease of native coronary artery without angina pectoris: Secondary | ICD-10-CM | POA: Diagnosis present

## 2016-09-21 DIAGNOSIS — J449 Chronic obstructive pulmonary disease, unspecified: Secondary | ICD-10-CM | POA: Diagnosis present

## 2016-09-21 DIAGNOSIS — Z882 Allergy status to sulfonamides status: Secondary | ICD-10-CM

## 2016-09-21 DIAGNOSIS — E1122 Type 2 diabetes mellitus with diabetic chronic kidney disease: Secondary | ICD-10-CM | POA: Diagnosis present

## 2016-09-21 DIAGNOSIS — E1165 Type 2 diabetes mellitus with hyperglycemia: Secondary | ICD-10-CM | POA: Diagnosis present

## 2016-09-21 DIAGNOSIS — L02415 Cutaneous abscess of right lower limb: Secondary | ICD-10-CM

## 2016-09-21 DIAGNOSIS — Z79899 Other long term (current) drug therapy: Secondary | ICD-10-CM

## 2016-09-21 DIAGNOSIS — S8011XA Contusion of right lower leg, initial encounter: Secondary | ICD-10-CM | POA: Diagnosis present

## 2016-09-21 DIAGNOSIS — E876 Hypokalemia: Secondary | ICD-10-CM | POA: Diagnosis present

## 2016-09-21 DIAGNOSIS — F329 Major depressive disorder, single episode, unspecified: Secondary | ICD-10-CM | POA: Diagnosis present

## 2016-09-21 DIAGNOSIS — L89893 Pressure ulcer of other site, stage 3: Secondary | ICD-10-CM | POA: Diagnosis present

## 2016-09-21 DIAGNOSIS — E785 Hyperlipidemia, unspecified: Secondary | ICD-10-CM | POA: Diagnosis present

## 2016-09-21 DIAGNOSIS — L03115 Cellulitis of right lower limb: Secondary | ICD-10-CM | POA: Diagnosis present

## 2016-09-21 DIAGNOSIS — Z6841 Body Mass Index (BMI) 40.0 and over, adult: Secondary | ICD-10-CM | POA: Diagnosis not present

## 2016-09-21 DIAGNOSIS — Z951 Presence of aortocoronary bypass graft: Secondary | ICD-10-CM

## 2016-09-21 DIAGNOSIS — Z7984 Long term (current) use of oral hypoglycemic drugs: Secondary | ICD-10-CM

## 2016-09-21 DIAGNOSIS — I130 Hypertensive heart and chronic kidney disease with heart failure and stage 1 through stage 4 chronic kidney disease, or unspecified chronic kidney disease: Secondary | ICD-10-CM | POA: Diagnosis present

## 2016-09-21 DIAGNOSIS — N183 Chronic kidney disease, stage 3 (moderate): Secondary | ICD-10-CM | POA: Diagnosis present

## 2016-09-21 DIAGNOSIS — Z86711 Personal history of pulmonary embolism: Secondary | ICD-10-CM

## 2016-09-21 DIAGNOSIS — Z7982 Long term (current) use of aspirin: Secondary | ICD-10-CM | POA: Diagnosis not present

## 2016-09-21 DIAGNOSIS — Z8673 Personal history of transient ischemic attack (TIA), and cerebral infarction without residual deficits: Secondary | ICD-10-CM

## 2016-09-21 DIAGNOSIS — G4733 Obstructive sleep apnea (adult) (pediatric): Secondary | ICD-10-CM | POA: Diagnosis present

## 2016-09-21 DIAGNOSIS — E11622 Type 2 diabetes mellitus with other skin ulcer: Secondary | ICD-10-CM | POA: Diagnosis not present

## 2016-09-21 DIAGNOSIS — Z85828 Personal history of other malignant neoplasm of skin: Secondary | ICD-10-CM

## 2016-09-21 DIAGNOSIS — N179 Acute kidney failure, unspecified: Secondary | ICD-10-CM | POA: Diagnosis present

## 2016-09-21 DIAGNOSIS — R609 Edema, unspecified: Secondary | ICD-10-CM | POA: Diagnosis present

## 2016-09-21 DIAGNOSIS — Z22322 Carrier or suspected carrier of Methicillin resistant Staphylococcus aureus: Secondary | ICD-10-CM

## 2016-09-21 DIAGNOSIS — D649 Anemia, unspecified: Secondary | ICD-10-CM | POA: Diagnosis present

## 2016-09-21 DIAGNOSIS — M1990 Unspecified osteoarthritis, unspecified site: Secondary | ICD-10-CM | POA: Diagnosis present

## 2016-09-21 DIAGNOSIS — I5032 Chronic diastolic (congestive) heart failure: Secondary | ICD-10-CM | POA: Diagnosis present

## 2016-09-21 DIAGNOSIS — Z881 Allergy status to other antibiotic agents status: Secondary | ICD-10-CM

## 2016-09-21 DIAGNOSIS — E871 Hypo-osmolality and hyponatremia: Secondary | ICD-10-CM | POA: Diagnosis present

## 2016-09-21 DIAGNOSIS — Z885 Allergy status to narcotic agent status: Secondary | ICD-10-CM

## 2016-09-21 DIAGNOSIS — Z7901 Long term (current) use of anticoagulants: Secondary | ICD-10-CM

## 2016-09-21 DIAGNOSIS — Z8711 Personal history of peptic ulcer disease: Secondary | ICD-10-CM

## 2016-09-21 DIAGNOSIS — Z87891 Personal history of nicotine dependence: Secondary | ICD-10-CM

## 2016-09-21 HISTORY — DX: Cellulitis, unspecified: L03.90

## 2016-09-21 LAB — CBC WITH DIFFERENTIAL/PLATELET
BASOS ABS: 0.1 10*3/uL (ref 0–0.1)
BASOS PCT: 1 %
Eosinophils Absolute: 0.1 10*3/uL (ref 0–0.7)
Eosinophils Relative: 0 %
HEMATOCRIT: 37.1 % — AB (ref 40.0–52.0)
HEMOGLOBIN: 11.5 g/dL — AB (ref 13.0–18.0)
Lymphocytes Relative: 2 %
Lymphs Abs: 0.4 10*3/uL — ABNORMAL LOW (ref 1.0–3.6)
MCH: 24.1 pg — ABNORMAL LOW (ref 26.0–34.0)
MCHC: 30.9 g/dL — ABNORMAL LOW (ref 32.0–36.0)
MCV: 78 fL — ABNORMAL LOW (ref 80.0–100.0)
MONOS PCT: 6 %
Monocytes Absolute: 0.9 10*3/uL (ref 0.2–1.0)
NEUTROS PCT: 91 %
Neutro Abs: 15.1 10*3/uL — ABNORMAL HIGH (ref 1.4–6.5)
Platelets: 431 10*3/uL (ref 150–440)
RBC: 4.75 MIL/uL (ref 4.40–5.90)
RDW: 17.5 % — ABNORMAL HIGH (ref 11.5–14.5)
WBC: 16.5 10*3/uL — AB (ref 3.8–10.6)

## 2016-09-21 LAB — HEPATIC FUNCTION PANEL
ALT: 24 U/L (ref 17–63)
AST: 35 U/L (ref 15–41)
Albumin: 3 g/dL — ABNORMAL LOW (ref 3.5–5.0)
Alkaline Phosphatase: 86 U/L (ref 38–126)
Bilirubin, Direct: <0.1 mg/dL — ABNORMAL LOW (ref 0.1–0.5)
Total Bilirubin: 0.6 mg/dL (ref 0.3–1.2)
Total Protein: 8.4 g/dL — ABNORMAL HIGH (ref 6.5–8.1)

## 2016-09-21 LAB — BASIC METABOLIC PANEL
ANION GAP: 16 — AB (ref 5–15)
BUN: 72 mg/dL — ABNORMAL HIGH (ref 6–20)
CALCIUM: 9.3 mg/dL (ref 8.9–10.3)
CO2: 31 mmol/L (ref 22–32)
Chloride: 84 mmol/L — ABNORMAL LOW (ref 101–111)
Creatinine, Ser: 2.15 mg/dL — ABNORMAL HIGH (ref 0.61–1.24)
GFR calc Af Amer: 34 mL/min — ABNORMAL LOW (ref >60)
GFR, EST NON AFRICAN AMERICAN: 29 mL/min — AB (ref >60)
GLUCOSE: 244 mg/dL — AB (ref 65–99)
Potassium: 3.4 mmol/L — ABNORMAL LOW (ref 3.5–5.1)
Sodium: 131 mmol/L — ABNORMAL LOW (ref 135–145)

## 2016-09-21 LAB — LACTIC ACID, PLASMA: Lactic Acid, Venous: 3.3 mmol/L (ref 0.5–1.9)

## 2016-09-21 LAB — SEDIMENTATION RATE: Sed Rate: 44 mm/hr — ABNORMAL HIGH (ref 0–20)

## 2016-09-21 LAB — PROTIME-INR
INR: 1.67
PROTHROMBIN TIME: 19.9 s — AB (ref 11.4–15.2)

## 2016-09-21 LAB — PROCALCITONIN: PROCALCITONIN: 0.2 ng/mL

## 2016-09-21 MED ORDER — INSULIN ASPART 100 UNIT/ML ~~LOC~~ SOLN
0.0000 [IU] | Freq: Every day | SUBCUTANEOUS | Status: DC
  Administered 2016-09-22: 2 [IU] via SUBCUTANEOUS
  Filled 2016-09-21: qty 2

## 2016-09-21 MED ORDER — POTASSIUM CHLORIDE CRYS ER 20 MEQ PO TBCR
20.0000 meq | EXTENDED_RELEASE_TABLET | Freq: Once | ORAL | Status: AC
  Administered 2016-09-22: 20 meq via ORAL
  Filled 2016-09-21: qty 1

## 2016-09-21 MED ORDER — VANCOMYCIN HCL IN DEXTROSE 1-5 GM/200ML-% IV SOLN: 1000.0000 mg | Freq: Once | INTRAVENOUS | Status: DC

## 2016-09-21 MED ORDER — SODIUM CHLORIDE 0.9 % IV SOLN
INTRAVENOUS | Status: DC
  Administered 2016-09-22: 01:00:00 via INTRAVENOUS

## 2016-09-21 MED ORDER — VANCOMYCIN HCL 10 G IV SOLR
1500.0000 mg | Freq: Once | INTRAVENOUS | Status: AC
  Administered 2016-09-22: 1500 mg via INTRAVENOUS
  Filled 2016-09-21 (×2): qty 1500

## 2016-09-21 MED ORDER — SODIUM CHLORIDE 0.9 % IV BOLUS (SEPSIS): 1000.0000 mL | Freq: Once | INTRAVENOUS | Status: DC

## 2016-09-21 MED ORDER — INSULIN ASPART 100 UNIT/ML ~~LOC~~ SOLN
0.0000 [IU] | Freq: Three times a day (TID) | SUBCUTANEOUS | Status: DC
  Administered 2016-09-22 (×2): 4 [IU] via SUBCUTANEOUS
  Administered 2016-09-22 – 2016-09-23 (×2): 3 [IU] via SUBCUTANEOUS
  Filled 2016-09-21: qty 4
  Filled 2016-09-21: qty 3
  Filled 2016-09-21: qty 4
  Filled 2016-09-21: qty 3

## 2016-09-21 MED ORDER — CLINDAMYCIN PHOSPHATE 600 MG/50ML IV SOLN
600.0000 mg | Freq: Once | INTRAVENOUS | Status: AC
  Administered 2016-09-21: 600 mg via INTRAVENOUS
  Filled 2016-09-21: qty 50

## 2016-09-21 MED ORDER — SODIUM CHLORIDE 0.9 % IV BOLUS (SEPSIS)
500.0000 mL | Freq: Once | INTRAVENOUS | Status: AC
  Administered 2016-09-21: 500 mL via INTRAVENOUS

## 2016-09-21 NOTE — ED Provider Notes (Signed)
[facility] Emergency Department Provider Note  ____________________________________________   First MD Initiated Contact with Patient 09/21/16 1905     (approximate)  I have reviewed the triage vital signs and the nursing notes.   HISTORY  Chief Complaint Wound Check    HPI [name] is a 71 y.o. male who is sent to the emergency department by his wound care physician Dr. Con Memos for IV antibiotics, inpatient admission, and orthopedic evaluation. The patient has a complex history that began with a traumatic fracture of his proximal right tibia that was complicated by a hematoma in a wound that is not healed. He has been followed by wound care and has had a wound VAC to his wound to his right leg and today was seen by the physician who left the following note:  "The patient is noted to have a large abscess starting at the right lateral knee joint and going down to the right lateral compartment with significant fluctuation an extensive amount of clots, seropurulent fluid and a ballotable swelling.  This is beyond the scope with outpatient treatment and he will need to go to the OR for a proper evacuation of this collection with possible placement of a wound VAC. Appropriate supportive care, including IV antibiotics and orthopedic intervention has been recommended and the patient is agreeable."  The patient reports having a wound VAC in place until it was changed today at which point he had a dressing applied and he came to the emergency department via EMS. He denies fevers or chills. He denies chest pain or shortness of breath. He denies abdominal pain nausea or vomiting.   Past Medical History:  Diagnosis Date  . Arthritis   . Asthma   . CHF (congestive heart failure) ([place])   . Diabetes ([place])   . Heart trouble   . History of stomach ulcers   . Hypertension   . 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 OSA (obstructive sleep apnea) 08/23/2016  . Pulmonary embolism  ([place]) 08/23/2016  . S/P CABG x 2   . Skin cancer   . Stroke [facility])     Patient Active Problem List   Diagnosis Date Noted  . Cellulitis 09/21/2016  . AKI (acute kidney injury) ([place]) 08/27/2016  . Demand ischemia ([place]) 08/27/2016  . Elevated troponin 08/27/2016  . 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 DVT (deep venous thrombosis) ([place]) 08/27/2016  . Pressure injury of skin 08/24/2016  . Pulmonary embolism ([place]) 08/23/2016  . SIRS (systemic inflammatory response syndrome) ([place]) 08/23/2016  . Acute on chronic renal failure ([employer]) 08/23/2016  . Acute on chronic respiratory failure ([place]) 08/23/2016  . Lactic acidosis 08/23/2016  . Electrolyte imbalance 08/23/2016  . Current use of proton pump inhibitor 08/23/2016  . OSA (obstructive sleep apnea) 08/23/2016  . History of bipolar disorder 08/23/2016  . History of gastric ulcer 08/23/2016  . History of asthma 08/23/2016  . History of chronic pain 08/23/2016  . Acute pulmonary embolism ([place]) 08/23/2016  . Chest pain 07/14/2016  . UTI (urinary tract infection) 04/27/2016  . CAD (coronary artery disease) 04/27/2016  . Diabetes ([place]) 04/27/2016  . HTN (hypertension) 04/27/2016  . Chronic diastolic CHF (congestive heart failure) ([place]) 04/27/2016  . CKD (chronic kidney disease), stage III 04/27/2016  . Chest pain, rule out acute myocardial infarction 02/16/2016    Past Surgical History:  Procedure Laterality Date  . CARDIAC SURGERY    . CHOLECYSTECTOMY    . GALLBLADDER SURGERY    . HEART BYPASS    .  HERNIA REPAIR    . IR ANGIOGRAM PULMONARY BILATERAL SELECTIVE  08/23/2016  . IR ANGIOGRAM SELECTIVE EACH ADDITIONAL VESSEL  08/23/2016  . IR ANGIOGRAM SELECTIVE EACH ADDITIONAL VESSEL  08/23/2016  . IR INFUSION THROMBOL ARTERIAL INITIAL (MS)  08/23/2016  . IR INFUSION THROMBOL ARTERIAL INITIAL (MS)  08/23/2016  . IR THROMB F/U EVAL ART/VEN FINAL DAY (MS)  08/24/2016  . IR US GUIDE VASC ACCESS RIGHT  08/23/2016  . KIDNEY SURGERY      Prior to Admission  medications   Medication Sig Start Date End Date Taking? Authorizing Provider  albuterol (PROAIR HFA) 108 (90 Base) MCG/ACT inhaler Inhale 2 puffs into the lungs every 4 (four) hours as needed for wheezing or shortness of breath.   Yes Historical Provider, MD  amLODipine (NORVASC) 10 MG tablet Take 10 mg by mouth daily.  05/31/13  Yes Historical Provider, MD  aspirin EC 81 MG tablet Take 81 mg by mouth daily.   Yes Historical Provider, MD  atorvastatin (LIPITOR) 80 MG tablet Take 80 mg by mouth every evening.   Yes Historical Provider, MD  buPROPion (WELLBUTRIN SR) 100 MG 12 hr tablet Take 100 mg by mouth daily.  05/31/13  Yes Historical Provider, MD  celecoxib (CELEBREX) 200 MG capsule Take 200 mg by mouth 2 (two) times daily.  05/21/16  Yes Historical Provider, MD  cetirizine (ZYRTEC) 10 MG tablet Take 10 mg by mouth daily.   Yes Historical Provider, MD  Cholecalciferol (VITAMIN D3) 50000 units CAPS Take 50,000 Units by mouth every Thursday.    Yes Historical Provider, MD  collagenase (SANTYL) ointment Apply 1 application topically See admin instructions. Apply to wound bed (nickel thick) daily for wound care   Yes Historical Provider, MD  docusate sodium (COLACE) 100 MG capsule Take 100 mg by mouth 3 (three) times daily.   Yes Historical Provider, MD  fluticasone (FLONASE) 50 MCG/ACT nasal spray Place 1 spray into both nostrils daily.   Yes Historical Provider, MD  Fluticasone-Salmeterol (ADVAIR) 100-50 MCG/DOSE AEPB Inhale 1 puff into the lungs 2 (two) times daily.   Yes Historical Provider, MD  furosemide (LASIX) 40 MG tablet Take 40 mg by mouth 2 (two) times daily.   Yes Historical Provider, MD  gabapentin (NEURONTIN) 400 MG capsule Take 400 mg by mouth 3 (three) times daily.  05/31/13  Yes Historical Provider, MD  hydrALAZINE (APRESOLINE) 25 MG tablet Take 25 mg by mouth 3 (three) times daily.    Yes Historical Provider, MD  hydrochlorothiazide (MICROZIDE) 12.5 MG capsule Take 12.5 mg by mouth  daily.   Yes Historical Provider, MD  ipratropium-albuterol (DUONEB) 0.5-2.5 (3) MG/3ML SOLN Inhale 3 mLs into the lungs every 4 (four) hours as needed (shortness of breath/wheezing).  05/01/16  Yes Historical Provider, MD  iron polysaccharides (NIFEREX) 150 MG capsule Take 150 mg by mouth daily.   Yes Historical Provider, MD  isosorbide mononitrate (IMDUR) 60 MG 24 hr tablet Take 60 mg by mouth daily.  05/31/13  Yes Historical Provider, MD  levothyroxine (SYNTHROID, LEVOTHROID) 100 MCG tablet Take 100 mcg by mouth daily before breakfast.  05/31/13  Yes Historical Provider, MD  magnesium oxide (MAG-OX) 400 MG tablet Take 400 mg by mouth daily.   Yes Historical Provider, MD  meclizine (ANTIVERT) 25 MG tablet Take 25 mg by mouth 2 (two) times daily as needed for dizziness.  05/31/13  Yes Historical Provider, MD  metFORMIN (GLUCOPHAGE) 500 MG tablet Take 500 mg by mouth 2 (two) times daily with  a meal.   Yes Historical Provider, MD  metolazone (ZAROXOLYN) 5 MG tablet Take 5 mg by mouth daily.   Yes Historical Provider, MD  metoprolol (LOPRESSOR) 50 MG tablet Take 50 mg by mouth 2 (two) times daily.  05/31/13  Yes Historical Provider, MD  montelukast (SINGULAIR) 10 MG tablet Take 1 tablet by mouth at bedtime.    Yes Historical Provider, MD  nystatin (NYSTATIN) powder Apply topically See admin instructions. Apply to abdominal folds and groin topically twice daily   Yes Historical Provider, MD  oxycodone (OXY-IR) 5 MG capsule Take 5 mg by mouth every 4 (four) hours as needed.   Yes Historical Provider, MD  pantoprazole (PROTONIX) 40 MG tablet Take 40 mg by mouth daily.    Yes Historical Provider, MD  Rivaroxaban (XARELTO) 15 MG TABS tablet Take 1 tablet (15 mg total) by mouth 2 (two) times daily with a meal. 08/27/16  Yes Christina P Rama, MD  senna-docusate (SENOKOT-S) 8.6-50 MG tablet Take 2 tablets by mouth daily.   Yes Historical Provider, MD  Suvorexant (BELSOMRA) 10 MG TABS Take 10 mg by mouth at bedtime.   Yes  Historical Provider, MD  ziprasidone (GEODON) 80 MG capsule Take 80 mg by mouth 2 (two) times daily with a meal.   Yes Historical Provider, MD  torsemide (DEMADEX) 10 MG tablet Take 10 mg by mouth daily.    Historical Provider, MD    Allergies Cephalosporins; Dilaudid [hydromorphone hcl]; Hydrocodone-acetaminophen; Morphine and related; Tetracycline; Tetracyclines & related; Penicillins; and Sulfa antibiotics  Family History  Problem Relation Age of Onset  . Diabetes Mother   . Heart attack Father   . Diabetes Brother   . Heart attack Brother     Social History Social History  Substance Use Topics  . Smoking status: Former Smoker    Quit date: 03/15/1963  . Smokeless tobacco: Never Used  . Alcohol use No    Review of Systems Constitutional: No fever/chills Eyes: No visual changes. ENT: No sore throat. Cardiovascular: Denies chest pain. Respiratory: Denies shortness of breath. Gastrointestinal: No abdominal pain.  No nausea, no vomiting.  No diarrhea.  No constipation. Genitourinary: Negative for dysuria. Musculoskeletal: Negative for back pain. Skin: Negative for rash. Neurological: Negative for headaches, focal weakness or numbness.  10-point ROS otherwise negative.  ____________________________________________   PHYSICAL EXAM:  VITAL SIGNS: ED Triage Vitals  Enc Vitals Group     BP 09/21/16 1703 (!) 144/55     Pulse Rate 09/21/16 1703 93     Resp 09/21/16 1703 (!) 22     Temp 09/21/16 1703 97.4 F (36.3 C)     Temp Source 09/21/16 1703 Oral     SpO2 09/21/16 1703 95 %     Weight 09/21/16 1701 (!) 367 lb (166.5 kg)     Height --      Head Circumference --      Peak Flow --      Pain Score --      Pain Loc --      Pain Edu? --      Excl. in [place]? --     Constitutional: Alert and oriented x 4 In no acute respiratory distress Eyes: PERRL EOMI. Head: Atraumatic. Nose: No congestion/rhinnorhea. Mouth/Throat: No trismus Neck: No stridor.     Cardiovascular: Normal rate, regular rhythm. Grossly normal heart sounds.  Good peripheral circulation. Respiratory: Elevated respiratory effort.  No retractions. Lungs CTAB and moving good air Gastrointestinal: Morbidly obese nontender Musculoskeletal: Right knee  with 4 cm x 2 cm wound on the medial aspect weeping thin fluid no surrounding crepitus Neurologic:  Normal speech and language. No gross focal neurologic deficits are appreciated. Skin:  Skin is warm, dry and intact. No rash noted. Psychiatric: Mood and affect are normal. Speech and behavior are normal.    ____________________________________________   DIFFERENTIAL  Septic joint, necrotizing soft tissue infection, cellulitis, abscess ____________________________________________   LABS (all labs ordered are listed, but only abnormal results are displayed)  Labs Reviewed  BASIC METABOLIC PANEL - Abnormal; Notable for the following:       Result Value   Sodium 131 (*)    Potassium 3.4 (*)    Chloride 84 (*)    Glucose, Bld 244 (*)    BUN 72 (*)    Creatinine, Ser 2.15 (*)    GFR calc non Af Amer 29 (*)    GFR calc Af Amer 34 (*)    Anion gap 16 (*)    All other components within normal limits  HEPATIC FUNCTION PANEL - Abnormal; Notable for the following:    Total Protein 8.4 (*)    Albumin 3.0 (*)    Bilirubin, Direct <0.1 (*)    All other components within normal limits  CBC WITH DIFFERENTIAL/PLATELET - Abnormal; Notable for the following:    WBC 16.5 (*)    Hemoglobin 11.5 (*)    HCT 37.1 (*)    MCV 78.0 (*)    MCH 24.1 (*)    MCHC 30.9 (*)    RDW 17.5 (*)    Neutro Abs 15.1 (*)    Lymphs Abs 0.4 (*)    All other components within normal limits  LACTIC ACID, PLASMA - Abnormal; Notable for the following:    Lactic Acid, Venous 3.3 (*)    All other components within normal limits  LACTIC ACID, PLASMA - Abnormal; Notable for the following:    Lactic Acid, Venous 2.7 (*)    All other components within  normal limits  PROTIME-INR - Abnormal; Notable for the following:    Prothrombin Time 19.9 (*)    All other components within normal limits  C-REACTIVE PROTEIN - Abnormal; Notable for the following:    CRP 13.1 (*)    All other components within normal limits  SEDIMENTATION RATE - Abnormal; Notable for the following:    Sed Rate 44 (*)    All other components within normal limits  GLUCOSE, CAPILLARY - Abnormal; Notable for the following:    Glucose-Capillary 229 (*)    All other components within normal limits  BASIC METABOLIC PANEL - Abnormal; Notable for the following:    Sodium 132 (*)    Potassium 2.7 (*)    Chloride 88 (*)    CO2 33 (*)    Glucose, Bld 164 (*)    BUN 65 (*)    Creatinine, Ser 1.90 (*)    GFR calc non Af Amer 34 (*)    GFR calc Af Amer 39 (*)    All other components within normal limits  CBC - Abnormal; Notable for the following:    WBC 12.3 (*)    RBC 4.33 (*)    Hemoglobin 10.5 (*)    HCT 33.8 (*)    MCV 78.0 (*)    MCH 24.1 (*)    MCHC 31.0 (*)    RDW 17.3 (*)    All other components within normal limits  GLUCOSE, CAPILLARY - Abnormal; Notable for the following:  Glucose-Capillary 176 (*)    All other components within normal limits  GLUCOSE, CAPILLARY - Abnormal; Notable for the following:    Glucose-Capillary 146 (*)    All other components within normal limits  GLUCOSE, CAPILLARY - Abnormal; Notable for the following:    Glucose-Capillary 162 (*)    All other components within normal limits  BASIC METABOLIC PANEL - Abnormal; Notable for the following:    Potassium 3.2 (*)    Chloride 95 (*)    Glucose, Bld 158 (*)    BUN 53 (*)    Creatinine, Ser 1.27 (*)    Calcium 8.8 (*)    GFR calc non Af Amer 55 (*)    All other components within normal limits  CULTURE, BLOOD (ROUTINE X 2)  CULTURE, BLOOD (ROUTINE X 2)  AEROBIC CULTURE (SUPERFICIAL SPECIMEN)  MRSA PCR SCREENING  AEROBIC/ANAEROBIC CULTURE (SURGICAL/DEEP WOUND)  PROCALCITONIN    LACTIC ACID, PLASMA  MAGNESIUM    Elevated white count and lactate are consistent with infection __________________________________________  EKG   ____________________________________________  RADIOLOGY  X-ray shows healing fracture with soft tissue ulcer ____________________________________________   PROCEDURES  Procedure(s) performed: no  Procedures  Critical Care performed: yes  CRITICAL CARE Performed by: Darel Hong   Total critical care time: 35 minutes  Critical care time was exclusive of separately billable procedures and treating other patients.  Critical care was necessary to treat or prevent imminent or life-threatening deterioration.  Critical care was time spent personally by me on the following activities: development of treatment plan with patient and/or surrogate as well as nursing, discussions with consultants, evaluation of patient's response to treatment, examination of patient, obtaining history from patient or surrogate, ordering and performing treatments and interventions, ordering and review of laboratory studies, ordering and review of radiographic studies, pulse oximetry and re-evaluation of patient's condition.   Observation: no ____________________________________________   INITIAL IMPRESSION / ASSESSMENT AND PLAN / ED COURSE  Pertinent labs & imaging results that were available during my care of the patient were reviewed by me and considered in my medical decision making (see chart for details).  On arrival the patient is hemodynamically stable with no signs of necrotizing soft tissue infection. X-ray and labs are pending after which point I will touch base with his orthopedic surgeon.     ----------------------------------------- 8:43 PM on 09/21/2016 -----------------------------------------  I discussed the case with Dr. Sabra Heck of [facility] who will kindly consult on the patient.    _Appreciate the patient's elevated  white count and lactate, however he has significant congestive heart failure and at this time 30 cc/kg of IV fluid would cause more harm than good and is contraindicated. ___________________________________________   FINAL CLINICAL IMPRESSION(S) / ED DIAGNOSES  Final diagnoses:  Cellulitis, unspecified cellulitis site      NEW MEDICATIONS STARTED DURING THIS VISIT:  Current Discharge Medication List       Note:  This document was prepared using Dragon voice recognition software and may include unintentional dictation errors.     Darel Hong, MD 09/22/16 1444

## 2016-09-21 NOTE — ED Triage Notes (Signed)
Pt to ed via [facility] with reports of wound clinic forgetting to put the sponge in with wound vac. Pt with PE x2 weeks ago and is on coumadin. Pt would not let ems check blood sugar in route to ed.  All vss per ems. When the nurse at the wound clinic pulled the wound vac off the wound bled and soaked up three chucks.

## 2016-09-21 NOTE — ED Notes (Signed)
Pt requested banana and mustard for leg cramp, informed that there are not bananas in ED but given 2 packets of mustard.

## 2016-09-21 NOTE — H&P (Addendum)
[facility] NAME: [name]    MR#:  [id number]  DATE OF BIRTH:  [birth year]  DATE OF ADMISSION:  09/21/2016  PRIMARY CARE PHYSICIAN: Volanda Napoleon, MD   REQUESTING/REFERRING PHYSICIAN:   CHIEF COMPLAINT:   Chief Complaint  Patient presents with  . Wound Check    HISTORY OF PRESENT ILLNESS: [name]  is a 71 y.o. male with a known history of Congestive heart failure, diabetes mellitus, hypertension, sleep apnea, pulmonary embolism on Xarelto for anticoagulation as the right knee wound and patient has a wound VAC. Today he went to wound care center clinic and the wound the wound VAC a lot of blood was out of the right knee wound area. He has a 7.5/8 cm horizontal wound below right patella area. And he also has a lot of swelling around the right knee and the upper part of the right shin. No history of any fever and chills. Has pain in the right lower extremity. Compliant with his anti-coagulation medication. Patient was evaluated in the emergency room he was found to have right knee cellulitis he was started on antibiotics. Hospitalist service was consulted for further care of the patient.  PAST MEDICAL HISTORY:   Past Medical History:  Diagnosis Date  . Arthritis   . Asthma   . CHF (congestive heart failure) ([place])   . Diabetes ([place])   . Heart trouble   . History of stomach ulcers   . Hypertension   . 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 OSA (obstructive sleep apnea) 08/23/2016  . Pulmonary embolism ([place]) 08/23/2016  . S/P CABG x 2   . Skin cancer   . Stroke [facility])     PAST SURGICAL HISTORY: Past Surgical History:  Procedure Laterality Date  . CARDIAC SURGERY    . CHOLECYSTECTOMY    . GALLBLADDER SURGERY    . HEART BYPASS    . HERNIA REPAIR    . IR ANGIOGRAM PULMONARY BILATERAL SELECTIVE  08/23/2016  . IR ANGIOGRAM SELECTIVE EACH ADDITIONAL VESSEL  08/23/2016  . IR ANGIOGRAM SELECTIVE EACH ADDITIONAL VESSEL  08/23/2016   . IR INFUSION THROMBOL ARTERIAL INITIAL (MS)  08/23/2016  . IR INFUSION THROMBOL ARTERIAL INITIAL (MS)  08/23/2016  . IR THROMB F/U EVAL ART/VEN FINAL DAY (MS)  08/24/2016  . IR US GUIDE VASC ACCESS RIGHT  08/23/2016  . KIDNEY SURGERY      SOCIAL HISTORY:  Social History  Substance Use Topics  . Smoking status: Former Smoker    Quit date: 03/15/1963  . Smokeless tobacco: Never Used  . Alcohol use No    FAMILY HISTORY:  Family History  Problem Relation Age of Onset  . Diabetes Mother   . Heart attack Father   . Diabetes Brother   . Heart attack Brother     DRUG ALLERGIES:  Allergies  Allergen Reactions  . Cephalosporins Other (See Comments)    Patient doesn't recall the reaction  . Dilaudid [Hydromorphone Hcl] Other (See Comments)    "CANNOT HEAR, CANNOT THINK"    . Hydrocodone-Acetaminophen Nausea And Vomiting  . Morphine And Related Other (See Comments)    Patient was told by a doctor that this "would kill" him  . Tetracycline Other (See Comments)    Patient was told by a doctor that this "would kill" him  . Tetracyclines & Related Other (See Comments)    Patient doesn't recall the reaction  . Penicillins Rash    Has  patient had a PCN reaction causing immediate rash, facial/tongue/throat swelling, SOB or lightheadedness with hypotension: Yes Has patient had a PCN reaction causing severe rash involving mucus membranes or skin necrosis: No Has patient had a PCN reaction that required hospitalization No Has patient had a PCN reaction occurring within the last 10 years: No If all of the above answers are "NO", then may proceed with Cephalosporin use.   . Sulfa Antibiotics Rash    REVIEW OF SYSTEMS:   CONSTITUTIONAL: No fever, fatigue or weakness.  EYES: No blurred or double vision.  EARS, NOSE, AND THROAT: No tinnitus or ear pain.  RESPIRATORY: No cough, shortness of breath, wheezing or hemoptysis.  CARDIOVASCULAR: No chest pain, orthopnea, edema.  GASTROINTESTINAL:  No nausea, vomiting, diarrhea or abdominal pain.  GENITOURINARY: No dysuria, hematuria.  ENDOCRINE: No polyuria, nocturia,  HEMATOLOGY: No anemia, easy bruising or bleeding SKIN: gaping wound right knee area MUSCULOSKELETAL: Has right knee pain and swelling Bleeding from wound NEUROLOGIC: No tingling, numbness, weakness.  PSYCHIATRY: No anxiety or depression.   MEDICATIONS AT HOME:  Prior to Admission medications   Medication Sig Start Date End Date Taking? Authorizing Provider  albuterol (PROAIR HFA) 108 (90 Base) MCG/ACT inhaler Inhale 2 puffs into the lungs every 4 (four) hours as needed for wheezing or shortness of breath.   Yes Historical Provider, MD  amLODipine (NORVASC) 10 MG tablet Take 10 mg by mouth daily.  05/31/13  Yes Historical Provider, MD  aspirin EC 81 MG tablet Take 81 mg by mouth daily.   Yes Historical Provider, MD  atorvastatin (LIPITOR) 80 MG tablet Take 80 mg by mouth every evening.   Yes Historical Provider, MD  buPROPion (WELLBUTRIN SR) 100 MG 12 hr tablet Take 100 mg by mouth daily.  05/31/13  Yes Historical Provider, MD  celecoxib (CELEBREX) 200 MG capsule Take 200 mg by mouth 2 (two) times daily.  05/21/16  Yes Historical Provider, MD  cetirizine (ZYRTEC) 10 MG tablet Take 10 mg by mouth daily.   Yes Historical Provider, MD  Cholecalciferol (VITAMIN D3) 50000 units CAPS Take 50,000 Units by mouth every Thursday.    Yes Historical Provider, MD  collagenase (SANTYL) ointment Apply 1 application topically See admin instructions. Apply to wound bed (nickel thick) daily for wound care   Yes Historical Provider, MD  docusate sodium (COLACE) 100 MG capsule Take 100 mg by mouth 3 (three) times daily.   Yes Historical Provider, MD  fluticasone (FLONASE) 50 MCG/ACT nasal spray Place 1 spray into both nostrils daily.   Yes Historical Provider, MD  Fluticasone-Salmeterol (ADVAIR) 100-50 MCG/DOSE AEPB Inhale 1 puff into the lungs 2 (two) times daily.   Yes Historical Provider, MD   furosemide (LASIX) 40 MG tablet Take 40 mg by mouth 2 (two) times daily.   Yes Historical Provider, MD  gabapentin (NEURONTIN) 400 MG capsule Take 400 mg by mouth 3 (three) times daily.  05/31/13  Yes Historical Provider, MD  hydrALAZINE (APRESOLINE) 25 MG tablet Take 25 mg by mouth 3 (three) times daily.    Yes Historical Provider, MD  hydrochlorothiazide (MICROZIDE) 12.5 MG capsule Take 12.5 mg by mouth daily.   Yes Historical Provider, MD  ipratropium-albuterol (DUONEB) 0.5-2.5 (3) MG/3ML SOLN Inhale 3 mLs into the lungs every 4 (four) hours as needed (shortness of breath/wheezing).  05/01/16  Yes Historical Provider, MD  iron polysaccharides (NIFEREX) 150 MG capsule Take 150 mg by mouth daily.   Yes Historical Provider, MD  isosorbide mononitrate (IMDUR) 60 MG  24 hr tablet Take 60 mg by mouth daily.  05/31/13  Yes Historical Provider, MD  levothyroxine (SYNTHROID, LEVOTHROID) 100 MCG tablet Take 100 mcg by mouth daily before breakfast.  05/31/13  Yes Historical Provider, MD  magnesium oxide (MAG-OX) 400 MG tablet Take 400 mg by mouth daily.   Yes Historical Provider, MD  meclizine (ANTIVERT) 25 MG tablet Take 25 mg by mouth 2 (two) times daily as needed for dizziness.  05/31/13  Yes Historical Provider, MD  metFORMIN (GLUCOPHAGE) 500 MG tablet Take 500 mg by mouth 2 (two) times daily with a meal.   Yes Historical Provider, MD  metolazone (ZAROXOLYN) 5 MG tablet Take 5 mg by mouth daily.   Yes Historical Provider, MD  metoprolol (LOPRESSOR) 50 MG tablet Take 50 mg by mouth 2 (two) times daily.  05/31/13  Yes Historical Provider, MD  montelukast (SINGULAIR) 10 MG tablet Take 1 tablet by mouth at bedtime.    Yes Historical Provider, MD  nystatin (NYSTATIN) powder Apply topically See admin instructions. Apply to abdominal folds and groin topically twice daily   Yes Historical Provider, MD  oxycodone (OXY-IR) 5 MG capsule Take 5 mg by mouth every 4 (four) hours as needed.   Yes Historical Provider, MD   pantoprazole (PROTONIX) 40 MG tablet Take 40 mg by mouth daily.    Yes Historical Provider, MD  Rivaroxaban (XARELTO) 15 MG TABS tablet Take 1 tablet (15 mg total) by mouth 2 (two) times daily with a meal. 08/27/16  Yes Christina P Rama, MD  senna-docusate (SENOKOT-S) 8.6-50 MG tablet Take 2 tablets by mouth daily.   Yes Historical Provider, MD  Suvorexant (BELSOMRA) 10 MG TABS Take 10 mg by mouth at bedtime.   Yes Historical Provider, MD  ziprasidone (GEODON) 80 MG capsule Take 80 mg by mouth 2 (two) times daily with a meal.   Yes Historical Provider, MD  torsemide (DEMADEX) 10 MG tablet Take 10 mg by mouth daily.    Historical Provider, MD      PHYSICAL EXAMINATION:   VITAL SIGNS: Blood pressure 134/63, pulse 80, temperature 97.4 F (36.3 C), temperature source Oral, resp. rate (!) 22, weight (!) 166.5 kg (367 lb), SpO2 99 %.  GENERAL:  71 y.o.-year-old patient lying in the bed with no acute distress.  EYES: Pupils equal, round, reactive to light and accommodation. No scleral icterus. Extraocular muscles intact.  HEENT: Head atraumatic, normocephalic. Oropharynx and nasopharynx clear.  NECK:  Supple, no jugular venous distention. No thyroid enlargement, no tenderness.  LUNGS: Normal breath sounds bilaterally, no wheezing, rales,rhonchi or crepitation. No use of accessory muscles of respiration.  CARDIOVASCULAR: S1, S2 normal. No murmurs, rubs, or gallops.  ABDOMEN: Soft, nontender, nondistended. Bowel sounds present. No organomegaly or mass.  EXTREMITIES: right knee swollen 7.5/8 cm horizontal gaping wound below patella with swelling of right knee Tenderness right knee and upper 1/3 of right leg NEUROLOGIC: Cranial nerves II through XII are intact. Muscle strength 5/5 in all extremities. Sensation intact. Gait not checked.  PSYCHIATRIC: The patient is alert and oriented x 3.  SKIN: gaping wound below patella right knee  LABORATORY PANEL:   CBC  Recent Labs Lab 09/21/16 1919  WBC  16.5*  HGB 11.5*  HCT 37.1*  PLT 431  MCV 78.0*  MCH 24.1*  MCHC 30.9*  RDW 17.5*  LYMPHSABS 0.4*  MONOABS 0.9  EOSABS 0.1  BASOSABS 0.1   ------------------------------------------------------------------------------------------------------------------  Chemistries   Recent Labs Lab 09/21/16 1919  NA 131*  K  3.4*  CL 84*  CO2 31  GLUCOSE 244*  BUN 72*  CREATININE 2.15*  CALCIUM 9.3  AST 35  ALT 24  ALKPHOS 86  BILITOT 0.6   ------------------------------------------------------------------------------------------------------------------ estimated creatinine clearance is 47.4 mL/min (A) (by C-G formula based on SCr of 2.15 mg/dL (H)). ------------------------------------------------------------------------------------------------------------------ No results for input(s): TSH, T4TOTAL, T3FREE, THYROIDAB in the last 72 hours.  Invalid input(s): FREET3   Coagulation profile  Recent Labs Lab 09/21/16 1919  INR 1.67   ------------------------------------------------------------------------------------------------------------------- No results for input(s): DDIMER in the last 72 hours. -------------------------------------------------------------------------------------------------------------------  Cardiac Enzymes No results for input(s): CKMB, TROPONINI, MYOGLOBIN in the last 168 hours.  Invalid input(s): CK ------------------------------------------------------------------------------------------------------------------ Invalid input(s): POCBNP  ---------------------------------------------------------------------------------------------------------------  Urinalysis    Component Value Date/Time   COLORURINE YELLOW (A) 08/23/2016 0543   APPEARANCEUR CLEAR (A) 08/23/2016 0543   APPEARANCEUR Hazy 08/20/2014 1116   LABSPEC 1.016 08/23/2016 0543   LABSPEC 1.016 08/20/2014 1116   PHURINE 5.0 08/23/2016 0543   GLUCOSEU 150 (A) 08/23/2016 0543   GLUCOSEU  Negative 08/20/2014 1116   HGBUR SMALL (A) 08/23/2016 0543   BILIRUBINUR NEGATIVE 08/23/2016 0543   BILIRUBINUR Negative 08/20/2014 1116   KETONESUR NEGATIVE 08/23/2016 0543   PROTEINUR NEGATIVE 08/23/2016 0543   NITRITE NEGATIVE 08/23/2016 0543   LEUKOCYTESUR NEGATIVE 08/23/2016 0543   LEUKOCYTESUR 3+ 08/20/2014 1116     RADIOLOGY: Dg Knee Complete 4 Views Right  Result Date: 09/21/2016 CLINICAL DATA:  Right knee wound. EXAM: RIGHT KNEE - COMPLETE 4+ VIEW COMPARISON:  Radiographs of July 20, 2016. FINDINGS: Minimally displaced fracture is seen involving the proximal fibula. Severe narrowing of medial joint space is noted with osteophyte formation. Moderate narrowing of patellofemoral space is noted with osteophyte formation. No joint effusion is noted. Soft tissue ulceration is seen anterior to the knee. IMPRESSION: Soft tissue ulceration or wound is noted anteriorly. Severe degenerative joint disease is noted. Minimally displaced proximal right fibular fracture. Electronically Signed   By: Marijo Conception, M.D.   On: 09/21/2016 19:47    EKG: Orders placed or performed during the hospital encounter of 08/23/16  . EKG 12-Lead  . EKG 12-Lead    IMPRESSION AND PLAN: 71 year old male patient with history of pulmonary embolism, congestive heart failure, diabetes mellitus, asthma, sleep apnea, CVA presented to the emergency room with increased swelling in the right knee and tenderness and bleeding. Admitting diagnosis 1. Right knee and leg hematoma 2. Right knee cellulitis and abscess 3. Acute kidney injury 4. Hyponatremia 5. Hypokalemia Treatment plan Admit patient to medical floor Start patient on IV vancomycin antibiotic and IV levaquin antibiotic CT scan of the right knee without contrast to assess for hematoma Orthopedic surgery consultation for right knee cellulitis and hematoma IV fluid hydration Hold diuretics Potassium supplementation We'll hold xarelto Supportive  care. All the records are reviewed and case discussed with ED provider. Management plans discussed with the patient, family and they are in agreement.  CODE STATUS:FULL CODE Surrogate decision maker : Daughter Code Status History    Date Active Date Inactive Code Status Order ID Comments User Context   08/23/2016 10:40 AM 08/27/2016 11:21 PM Full Code [id number]  Brand Males, MD Inpatient   07/14/2016  8:10 AM 07/14/2016  7:07 PM Full Code [id number]  Saundra Shelling, MD ED   04/27/2016  2:28 AM 04/28/2016  5:18 PM Full Code [id number]  Lance Coon, MD Inpatient   02/16/2016  8:34 PM 02/17/2016  3:40 PM Full Code [id number]  Harvie Bridge, DO ED  Advance Directive Documentation     Most Recent Value  Type of Advance Directive  Living will  Pre-existing out of facility DNR order (yellow form or pink MOST form)  -  "MOST" Form in Place?  -       TOTAL TIME TAKING CARE OF THIS PATIENT: 52 minutes.    Saundra Shelling M.D on 09/21/2016 at 11:36 PM  Between 7am to 6pm - Pager - [phone]  After 6pm go to [email or web address] - password [email or web address] [facility]  Office  [phone]  CC: Primary care physician; [name] Dupre AHMED, MD   CT knee reviewed Large hematoma right knee and right leg.

## 2016-09-21 NOTE — ED Notes (Signed)
Pt daughter states pt broke R leg and developed a hematoma to R knee. Aspirated recently and pt developed PE in each lung. PT was in ICU, switched from eliquis (for TIA) to [place]. Pt was at wound clinic and had wound vac taken out today, they noticed knee was bleeding with clots per daughter. Pt knee wrapped. Pt R shin red. Pt alert, oriented. Daughter states pt is at [place] care for PT. States pt has been walking on R leg. Pt states MRSA in the nose.

## 2016-09-22 LAB — BASIC METABOLIC PANEL
ANION GAP: 9 (ref 5–15)
Anion gap: 11 (ref 5–15)
BUN: 65 mg/dL — ABNORMAL HIGH (ref 6–20)
BUN: 53 mg/dL — ABNORMAL HIGH (ref 6–20)
CALCIUM: 8.8 mg/dL — AB (ref 8.9–10.3)
CHLORIDE: 88 mmol/L — AB (ref 101–111)
CO2: 33 mmol/L — ABNORMAL HIGH (ref 22–32)
CO2: 31 mmol/L (ref 22–32)
CREATININE: 1.9 mg/dL — AB (ref 0.61–1.24)
Calcium: 9.1 mg/dL (ref 8.9–10.3)
Chloride: 95 mmol/L — ABNORMAL LOW (ref 101–111)
Creatinine, Ser: 1.27 mg/dL — ABNORMAL HIGH (ref 0.61–1.24)
GFR calc Af Amer: >60 mL/min (ref >60)
GFR calc non Af Amer: 34 mL/min — ABNORMAL LOW (ref >60)
GFR, EST AFRICAN AMERICAN: 39 mL/min — AB (ref >60)
GFR, EST NON AFRICAN AMERICAN: 55 mL/min — AB (ref >60)
Glucose, Bld: 164 mg/dL — ABNORMAL HIGH (ref 65–99)
Glucose, Bld: 158 mg/dL — ABNORMAL HIGH (ref 65–99)
POTASSIUM: 2.7 mmol/L — AB (ref 3.5–5.1)
Potassium: 3.2 mmol/L — ABNORMAL LOW (ref 3.5–5.1)
SODIUM: 132 mmol/L — AB (ref 135–145)
Sodium: 135 mmol/L (ref 135–145)

## 2016-09-22 LAB — GLUCOSE, CAPILLARY
GLUCOSE-CAPILLARY: 176 mg/dL — AB (ref 65–99)
GLUCOSE-CAPILLARY: 188 mg/dL — AB (ref 65–99)
GLUCOSE-CAPILLARY: 210 mg/dL — AB (ref 65–99)
GLUCOSE-CAPILLARY: 229 mg/dL — AB (ref 65–99)
Glucose-Capillary: 146 mg/dL — ABNORMAL HIGH (ref 65–99)
Glucose-Capillary: 162 mg/dL — ABNORMAL HIGH (ref 65–99)
Glucose-Capillary: 171 mg/dL — ABNORMAL HIGH (ref 65–99)

## 2016-09-22 LAB — LACTIC ACID, PLASMA
Lactic Acid, Venous: 2.7 mmol/L (ref 0.5–1.9)
Lactic Acid, Venous: 1.9 mmol/L (ref 0.5–1.9)

## 2016-09-22 LAB — CBC
HCT: 33.8 % — ABNORMAL LOW (ref 40.0–52.0)
Hemoglobin: 10.5 g/dL — ABNORMAL LOW (ref 13.0–18.0)
MCH: 24.1 pg — ABNORMAL LOW (ref 26.0–34.0)
MCHC: 31 g/dL — ABNORMAL LOW (ref 32.0–36.0)
MCV: 78 fL — AB (ref 80.0–100.0)
PLATELETS: 401 10*3/uL (ref 150–440)
RBC: 4.33 MIL/uL — AB (ref 4.40–5.90)
RDW: 17.3 % — AB (ref 11.5–14.5)
WBC: 12.3 10*3/uL — AB (ref 3.8–10.6)

## 2016-09-22 LAB — MRSA PCR SCREENING: MRSA by PCR: NEGATIVE

## 2016-09-22 LAB — MAGNESIUM: MAGNESIUM: 2.4 mg/dL (ref 1.7–2.4)

## 2016-09-22 LAB — C-REACTIVE PROTEIN: CRP: 13.1 mg/dL — AB (ref <1.0)

## 2016-09-22 MED ORDER — VITAMIN K1 10 MG/ML IJ SOLN: 10.0000 mg | Freq: Once | INTRAMUSCULAR | Status: DC

## 2016-09-22 MED ORDER — ACETAMINOPHEN 325 MG PO TABS
650.0000 mg | ORAL_TABLET | Freq: Four times a day (QID) | ORAL | Status: DC | PRN
  Administered 2016-09-22 – 2016-09-23 (×2): 650 mg via ORAL
  Filled 2016-09-22 (×2): qty 2

## 2016-09-22 MED ORDER — RIVAROXABAN 15 MG PO TABS
15.0000 mg | ORAL_TABLET | Freq: Two times a day (BID) | ORAL | Status: DC
  Administered 2016-09-22: 15 mg via ORAL
  Filled 2016-09-22: qty 1

## 2016-09-22 MED ORDER — DOCUSATE SODIUM 100 MG PO CAPS
100.0000 mg | ORAL_CAPSULE | Freq: Three times a day (TID) | ORAL | Status: DC
  Administered 2016-09-22 – 2016-09-23 (×4): 100 mg via ORAL
  Filled 2016-09-22 (×4): qty 1

## 2016-09-22 MED ORDER — ACETAMINOPHEN 650 MG RE SUPP: 650.0000 mg | Freq: Four times a day (QID) | RECTAL | Status: DC | PRN

## 2016-09-22 MED ORDER — GABAPENTIN 400 MG PO CAPS
400.0000 mg | ORAL_CAPSULE | Freq: Three times a day (TID) | ORAL | Status: DC
  Administered 2016-09-22 – 2016-09-23 (×4): 400 mg via ORAL
  Filled 2016-09-22 (×4): qty 1

## 2016-09-22 MED ORDER — METOPROLOL TARTRATE 50 MG PO TABS
50.0000 mg | ORAL_TABLET | Freq: Two times a day (BID) | ORAL | Status: DC
Start: 2016-09-22 — End: 2016-09-23
  Administered 2016-09-22: 50 mg via ORAL
  Filled 2016-09-22: qty 1

## 2016-09-22 MED ORDER — ENOXAPARIN SODIUM 40 MG/0.4ML ~~LOC~~ SOLN: 40.0000 mg | Freq: Two times a day (BID) | SUBCUTANEOUS | Status: DC

## 2016-09-22 MED ORDER — HYDRALAZINE HCL 50 MG PO TABS
25.0000 mg | ORAL_TABLET | Freq: Three times a day (TID) | ORAL | Status: DC
  Administered 2016-09-22 (×2): 25 mg via ORAL
  Filled 2016-09-22 (×2): qty 2

## 2016-09-22 MED ORDER — POTASSIUM CHLORIDE CRYS ER 20 MEQ PO TBCR
20.0000 meq | EXTENDED_RELEASE_TABLET | Freq: Once | ORAL | Status: AC
  Administered 2016-09-22: 20 meq via ORAL
  Filled 2016-09-22: qty 1

## 2016-09-22 MED ORDER — BUPROPION HCL ER (SR) 100 MG PO TB12
100.0000 mg | ORAL_TABLET | Freq: Every day | ORAL | Status: DC
  Administered 2016-09-22 – 2016-09-23 (×2): 100 mg via ORAL
  Filled 2016-09-22 (×2): qty 1

## 2016-09-22 MED ORDER — FLUTICASONE PROPIONATE 50 MCG/ACT NA SUSP
1.0000 | Freq: Every day | NASAL | Status: DC
  Administered 2016-09-22 – 2016-09-23 (×2): 1 via NASAL
  Filled 2016-09-22: qty 16

## 2016-09-22 MED ORDER — OXYCODONE HCL 5 MG PO TABS
5.0000 mg | ORAL_TABLET | ORAL | Status: DC | PRN
  Administered 2016-09-22 – 2016-09-23 (×2): 5 mg via ORAL
  Filled 2016-09-22 (×2): qty 1

## 2016-09-22 MED ORDER — SUVOREXANT 10 MG PO TABS: 10.0000 mg | ORAL_TABLET | Freq: Every day | ORAL | Status: DC

## 2016-09-22 MED ORDER — SODIUM CHLORIDE 0.9 % IV SOLN: 250.0000 mL | INTRAVENOUS | Status: DC | PRN

## 2016-09-22 MED ORDER — VANCOMYCIN HCL 10 G IV SOLR
1500.0000 mg | INTRAVENOUS | Status: DC
  Administered 2016-09-22 – 2016-09-23 (×2): 1500 mg via INTRAVENOUS
  Filled 2016-09-22 (×3): qty 1500

## 2016-09-22 MED ORDER — POTASSIUM CHLORIDE CRYS ER 20 MEQ PO TBCR
40.0000 meq | EXTENDED_RELEASE_TABLET | Freq: Once | ORAL | Status: AC
  Administered 2016-09-22: 40 meq via ORAL
  Filled 2016-09-22: qty 2

## 2016-09-22 MED ORDER — ALBUTEROL SULFATE (2.5 MG/3ML) 0.083% IN NEBU
3.0000 mL | INHALATION_SOLUTION | RESPIRATORY_TRACT | Status: DC | PRN
  Administered 2016-09-23: 3 mL via RESPIRATORY_TRACT
  Filled 2016-09-22: qty 3

## 2016-09-22 MED ORDER — POTASSIUM CHLORIDE CRYS ER 20 MEQ PO TBCR
20.0000 meq | EXTENDED_RELEASE_TABLET | ORAL | Status: AC
  Administered 2016-09-22: 20 meq via ORAL
  Filled 2016-09-22: qty 1

## 2016-09-22 MED ORDER — ATORVASTATIN CALCIUM 20 MG PO TABS
80.0000 mg | ORAL_TABLET | Freq: Every evening | ORAL | Status: DC
  Administered 2016-09-22: 80 mg via ORAL
  Filled 2016-09-22: qty 4

## 2016-09-22 MED ORDER — MECLIZINE HCL 25 MG PO TABS: 25.0000 mg | ORAL_TABLET | Freq: Two times a day (BID) | ORAL | Status: DC | PRN

## 2016-09-22 MED ORDER — LEVOTHYROXINE SODIUM 50 MCG PO TABS
100.0000 ug | ORAL_TABLET | Freq: Every day | ORAL | Status: DC
  Administered 2016-09-22 – 2016-09-23 (×2): 100 ug via ORAL
  Filled 2016-09-22 (×2): qty 2

## 2016-09-22 MED ORDER — SODIUM CHLORIDE 0.9% FLUSH
3.0000 mL | Freq: Two times a day (BID) | INTRAVENOUS | Status: DC
  Administered 2016-09-22: 3 mL via INTRAVENOUS

## 2016-09-22 MED ORDER — SODIUM CHLORIDE 0.9% FLUSH: 3.0000 mL | INTRAVENOUS | Status: DC | PRN

## 2016-09-22 MED ORDER — MAGNESIUM OXIDE 400 (241.3 MG) MG PO TABS
400.0000 mg | ORAL_TABLET | Freq: Every day | ORAL | Status: DC
  Administered 2016-09-22 – 2016-09-23 (×2): 400 mg via ORAL
  Filled 2016-09-22 (×2): qty 1

## 2016-09-22 MED ORDER — ISOSORBIDE MONONITRATE ER 30 MG PO TB24
60.0000 mg | ORAL_TABLET | Freq: Every day | ORAL | Status: DC
  Administered 2016-09-22: 60 mg via ORAL
  Filled 2016-09-22: qty 2

## 2016-09-22 MED ORDER — ZIPRASIDONE HCL 80 MG PO CAPS
80.0000 mg | ORAL_CAPSULE | Freq: Two times a day (BID) | ORAL | Status: DC
  Administered 2016-09-22 – 2016-09-23 (×3): 80 mg via ORAL
  Filled 2016-09-22: qty 4
  Filled 2016-09-22 (×2): qty 1

## 2016-09-22 MED ORDER — SENNOSIDES-DOCUSATE SODIUM 8.6-50 MG PO TABS
2.0000 | ORAL_TABLET | Freq: Every day | ORAL | Status: DC
  Administered 2016-09-22 – 2016-09-23 (×2): 2 via ORAL
  Filled 2016-09-22 (×2): qty 2

## 2016-09-22 MED ORDER — MOMETASONE FURO-FORMOTEROL FUM 100-5 MCG/ACT IN AERO
2.0000 | INHALATION_SPRAY | Freq: Two times a day (BID) | RESPIRATORY_TRACT | Status: DC
  Administered 2016-09-22 – 2016-09-23 (×3): 2 via RESPIRATORY_TRACT
  Filled 2016-09-22: qty 8.8

## 2016-09-22 MED ORDER — PANTOPRAZOLE SODIUM 40 MG PO TBEC
40.0000 mg | DELAYED_RELEASE_TABLET | Freq: Every day | ORAL | Status: DC
  Administered 2016-09-22 – 2016-09-23 (×2): 40 mg via ORAL
  Filled 2016-09-22 (×2): qty 1

## 2016-09-22 MED ORDER — LEVOFLOXACIN IN D5W 750 MG/150ML IV SOLN
750.0000 mg | INTRAVENOUS | Status: DC
  Administered 2016-09-23: 750 mg via INTRAVENOUS
  Filled 2016-09-22: qty 150

## 2016-09-22 MED ORDER — AMLODIPINE BESYLATE 5 MG PO TABS
10.0000 mg | ORAL_TABLET | Freq: Every day | ORAL | Status: DC
  Administered 2016-09-22: 10 mg via ORAL
  Filled 2016-09-22 (×2): qty 2

## 2016-09-22 MED ORDER — POLYSACCHARIDE IRON COMPLEX 150 MG PO CAPS
150.0000 mg | ORAL_CAPSULE | Freq: Every day | ORAL | Status: DC
  Administered 2016-09-22: 150 mg via ORAL
  Filled 2016-09-22 (×2): qty 1

## 2016-09-22 MED ORDER — LEVOFLOXACIN IN D5W 750 MG/150ML IV SOLN
750.0000 mg | Freq: Once | INTRAVENOUS | Status: AC
  Administered 2016-09-22: 750 mg via INTRAVENOUS
  Filled 2016-09-22: qty 150

## 2016-09-22 MED ORDER — LEVOFLOXACIN IN D5W 750 MG/150ML IV SOLN: 750.0000 mg | INTRAVENOUS | Status: DC

## 2016-09-22 MED ORDER — IPRATROPIUM-ALBUTEROL 0.5-2.5 (3) MG/3ML IN SOLN: 3.0000 mL | RESPIRATORY_TRACT | Status: DC | PRN

## 2016-09-22 NOTE — Progress Notes (Signed)
Pharmacy Antibiotic Note  [name] is a 71 y.o. male admitted on 09/21/2016 with cellulitis/wound infection.  Pharmacy has been consulted for vancomycin and Levaquin dosing.  Plan: DW 106kg  Vd 74L kei 0.043 hr-1  t1/2 16 hours Vancomycin 1500 mg q 18 hours ordered with stacked dosing. Level before 5th dose. Goal trough 15-20.  Levaquin 750 mg q 24 hours ordered.  Weight: (!) 367 lb (166.5 kg)  Temp (24hrs), Avg:98.1 F (36.7 C), Min:97.4 F (36.3 C), Max:98.8 F (37.1 C)   Recent Labs Lab 09/21/16 1919 09/21/16 2318 09/22/16 0329  WBC 16.5*  --  12.3*  CREATININE 2.15*  --  1.90*  LATICACIDVEN 3.3* 2.7* 1.9    Estimated Creatinine Clearance: 53.6 mL/min (A) (by C-G formula based on SCr of 1.9 mg/dL (H)).    Allergies  Allergen Reactions  . Cephalosporins Other (See Comments)    Patient doesn't recall the reaction  . Dilaudid [Hydromorphone Hcl] Other (See Comments)    "CANNOT HEAR, CANNOT THINK"    . Hydrocodone-Acetaminophen Nausea And Vomiting  . Morphine And Related Other (See Comments)    Patient was told by a doctor that this "would kill" him  . Tetracycline Other (See Comments)    Patient was told by a doctor that this "would kill" him  . Tetracyclines & Related Other (See Comments)    Patient doesn't recall the reaction  . Penicillins Rash    Has patient had a PCN reaction causing immediate rash, facial/tongue/throat swelling, SOB or lightheadedness with hypotension: Yes Has patient had a PCN reaction causing severe rash involving mucus membranes or skin necrosis: No Has patient had a PCN reaction that required hospitalization No Has patient had a PCN reaction occurring within the last 10 years: No If all of the above answers are "NO", then may proceed with Cephalosporin use.   . Sulfa Antibiotics Rash    Antimicrobials this admission: Levaquin vancomycin 5/1 >>    >>   Dose adjustments this admission:   Microbiology results: 4/30 BCx:  pending 4/30 WoundCx: pending  5/1  MRSA PCR: (-)    Thank you for allowing pharmacy to be a part of this patient's care.  Derrian Poli S 09/22/2016 4:50 AM

## 2016-09-22 NOTE — Progress Notes (Signed)
PT Cancellation Note  Patient Details Name: [name] MRN: [id number] DOB: June 07, 1945   Cancelled Treatment:    Reason Eval/Treat Not Completed: Patient declined, no reason specified Pt was more interested in eating dinner (asked for help opening his vinegar for his greens) than working with PT today.  His K+ was 2.7 this AM, had improved to 3.2 this afternoon. Will hold PT exam until later date.  Kreg Shropshire, DPT 09/22/2016, 4:21 PM

## 2016-09-22 NOTE — Consult Note (Signed)
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   Has been seen by Dr. Con Memos at the [facility].  Debridement and discontinued NPWT.  Will treat topically.  Pending MD consults for orthopedic service and surgery services.  Wound type:Infectious, stage 3 pressure injury Pressure Injury POA: Yes Measurement: 1.8 cm x 5.6 cm x 0.4 cm  Wound FBX:UXYBF red, friable and tender.  Drainage (amount, consistency, odor) Bleeding with dressing changes.   Periwound:erythema and edema Dressing procedure/placement/frequency:Cleanse right knee with NS and pat gently dry.  Apply Aquacel Ag to right knee.  Change on MOnday and Thursday and PRN soilage.  Will not follow at this time.  Please re-consult if needed.  Domenic Moras RN BSN [place] Pager [phone]

## 2016-09-22 NOTE — Progress Notes (Signed)
MEDICATION RELATED CONSULT NOTE- follow up  Pharmacy Consult for Electrolyte Monitoring/Replacement Indication: hypokalemia  Allergies  Allergen Reactions  . Cephalosporins Other (See Comments)    Patient doesn't recall the reaction  . Dilaudid [Hydromorphone Hcl] Other (See Comments)    "CANNOT HEAR, CANNOT THINK"    . Hydrocodone-Acetaminophen Nausea And Vomiting  . Morphine And Related Other (See Comments)    Patient was told by a doctor that this "would kill" him  . Tetracycline Other (See Comments)    Patient was told by a doctor that this "would kill" him  . Tetracyclines & Related Other (See Comments)    Patient doesn't recall the reaction  . Penicillins Rash    Has patient had a PCN reaction causing immediate rash, facial/tongue/throat swelling, SOB or lightheadedness with hypotension: Yes Has patient had a PCN reaction causing severe rash involving mucus membranes or skin necrosis: No Has patient had a PCN reaction that required hospitalization No Has patient had a PCN reaction occurring within the last 10 years: No If all of the above answers are "NO", then may proceed with Cephalosporin use.   . Sulfa Antibiotics Rash    Patient Measurements: Height: 5\' 6"  (167.6 cm) (patient stated but unsure) Weight: (!) 367 lb (166.5 kg) IBW/kg (Calculated) : 63.8 Adjusted Body Weight:    Vital Signs: Temp: 98.8 F (37.1 C) (05/01 0743) Temp Source: Oral (05/01 0743) BP: 128/55 (05/01 0743) Pulse Rate: 81 (05/01 0743) Intake/Output from previous day: 04/30 0701 - 05/01 0700 In: 550 [IV Piggyback:550] Out: -  Intake/Output from this shift: Total I/O In: 740 [P.O.:240; I.V.:500] Out: -   Labs:  Recent Labs  09/21/16 1919 09/22/16 0329 09/22/16 1225  WBC 16.5* 12.3*  --   HGB 11.5* 10.5*  --   HCT 37.1* 33.8*  --   PLT 431 401  --   CREATININE 2.15* 1.90* 1.27*  MG  --  2.4  --   ALBUMIN 3.0*  --   --   PROT 8.4*  --   --   AST 35  --   --   ALT 24  --    --   ALKPHOS 86  --   --   BILITOT 0.6  --   --   BILIDIR <0.1*  --   --   IBILI NOT CALCULATED  --   --    Lab Results  Component Value Date   K 3.2 (L) 09/22/2016   Estimated Creatinine Clearance: 79.2 mL/min (A) (by C-G formula based on SCr of 1.27 mg/dL (H)).  Medical History: Past Medical History:  Diagnosis Date  . Arthritis   . Asthma   . CHF (congestive heart failure) ([place])   . Diabetes ([place])   . Heart trouble   . History of stomach ulcers   . Hypertension   . 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 OSA (obstructive sleep apnea) 08/23/2016  . Pulmonary embolism ([place]) 08/23/2016  . S/P CABG x 2   . Skin cancer   . Stroke [facility])     Medications:  Scheduled:  . amLODipine  10 mg Oral Daily  . atorvastatin  80 mg Oral QPM  . buPROPion  100 mg Oral Daily  . docusate sodium  100 mg Oral TID  . fluticasone  1 spray Each Nare Daily  . gabapentin  400 mg Oral TID  . hydrALAZINE  25 mg Oral TID  . insulin aspart  0-20 Units Subcutaneous TID WC  . insulin aspart  0-5  Units Subcutaneous QHS  . iron polysaccharides  150 mg Oral Daily  . isosorbide mononitrate  60 mg Oral Daily  . levothyroxine  100 mcg Oral QAC breakfast  . magnesium oxide  400 mg Oral Daily  . metoprolol  50 mg Oral BID  . mometasone-formoterol  2 puff Inhalation BID  . pantoprazole  40 mg Oral Daily  . potassium chloride  20 mEq Oral Q2H  . senna-docusate  2 tablet Oral Daily  . sodium chloride flush  3 mL Intravenous Q12H  . Suvorexant  10 mg Oral QHS  . ziprasidone  80 mg Oral BID WC    Assessment: 71 yo M admitted with Cellulitits, knee hematoma.  Patient on lasix, hydrochlorothiazide, metolazone, torsemide, mag-ox PTA (per [facility])  Goal of Therapy:  Electrolytes WNL  Plan:  K 2.7,  Mag 2.4  Scr 1.90 (labs @ 0329) Patient received KCL PO 47meq @ 0637 and KCL PO 25meq @ 0507. Currently ordered Mag-Ox 400 daily  Will recheck Potassium level at 1200.  5/1:  K @ 1225= 3.2.   Scr 1.27 Will give  KCL 27meq PO q2h x 2 doses.  Diuretics are on hold. F/u electrolytes in am.   Nekeya Briski A 09/22/2016,1:34 PM

## 2016-09-22 NOTE — Consult Note (Signed)
SURGICAL CONSULTATION NOTE (initial) - cpt's: 62836, [phone], [phone]  HISTORY OF PRESENT ILLNESS (HPI):  71 y.o. morbidly obese male presented to [facility] ED yesterday upon referral from Dr. Con Memos at the wound clinic for Right leg pain, swelling, and drainage from a Right knee wound. Patient reports that he fell and fractured his Right fibula several weeks ago. At the time, he was taking aspirin and Plavix s/p CABG, but only a small hematoma was appreciated following his fall. Patient was fitted into a fixating boot to stabilize the fracture, after which patient developed a PE and was started on Xarelto. It's not clear when the hematoma enlarged, but he then developed a pressure wound of his Right knee that has been treated with serial debridements and wound VAC to promote drainage by patient's wound care physician. When patient presented yesterday to the wound clinic he is reported to have had "a large abscess starting at the right lateral knee joint and going down to the right lateral compartment with significant fluctuation and extensive amount of clots, seropurulent fluid, and swelling." This was felt to be beyond the scope of outpatient treatment, and he was referred to [facility] for orthopedic evacuation of fluid collection with possible placement of a wound VAC. Patient was quite somnolent upon initial exam, but was able to indicate he'd just recently received narcotic pain medication, and he was completely lucent and accurate in providing a relatively detailed history upon follow-up evaluation. He otherwise denies fever/chills, CP, or SOB.  Surgery is consulted by medical physician Dr. Benjie Karvonen in this context for evaluation and management of large Right leg infected hematoma (abscess).  PAST MEDICAL HISTORY (PMH):  Past Medical History:  Diagnosis Date  . Arthritis   . Asthma   . CHF (congestive heart failure) ([place])   . Diabetes ([place])   . Heart trouble   . History of stomach ulcers   . Hypertension   .  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 OSA (obstructive sleep apnea) 08/23/2016  . Pulmonary embolism ([place]) 08/23/2016  . S/P CABG x 2   . Skin cancer   . Stroke [facility])      PAST SURGICAL HISTORY ([place]):  Past Surgical History:  Procedure Laterality Date  . CARDIAC SURGERY    . CHOLECYSTECTOMY    . GALLBLADDER SURGERY    . HEART BYPASS    . HERNIA REPAIR    . IR ANGIOGRAM PULMONARY BILATERAL SELECTIVE  08/23/2016  . IR ANGIOGRAM SELECTIVE EACH ADDITIONAL VESSEL  08/23/2016  . IR ANGIOGRAM SELECTIVE EACH ADDITIONAL VESSEL  08/23/2016  . IR INFUSION THROMBOL ARTERIAL INITIAL (MS)  08/23/2016  . IR INFUSION THROMBOL ARTERIAL INITIAL (MS)  08/23/2016  . IR THROMB F/U EVAL ART/VEN FINAL DAY (MS)  08/24/2016  . IR US GUIDE VASC ACCESS RIGHT  08/23/2016  . KIDNEY SURGERY       MEDICATIONS:  Prior to Admission medications   Medication Sig Start Date End Date Taking? Authorizing Provider  albuterol (PROAIR HFA) 108 (90 Base) MCG/ACT inhaler Inhale 2 puffs into the lungs every 4 (four) hours as needed for wheezing or shortness of breath.   Yes Historical Provider, MD  amLODipine (NORVASC) 10 MG tablet Take 10 mg by mouth daily.  05/31/13  Yes Historical Provider, MD  aspirin EC 81 MG tablet Take 81 mg by mouth daily.   Yes Historical Provider, MD  atorvastatin (LIPITOR) 80 MG tablet Take 80 mg by mouth every evening.   Yes Historical Provider, MD  buPROPion [facility] SR) 100  MG 12 hr tablet Take 100 mg by mouth daily.  05/31/13  Yes Historical Provider, MD  celecoxib (CELEBREX) 200 MG capsule Take 200 mg by mouth 2 (two) times daily.  05/21/16  Yes Historical Provider, MD  cetirizine (ZYRTEC) 10 MG tablet Take 10 mg by mouth daily.   Yes Historical Provider, MD  Cholecalciferol (VITAMIN D3) 50000 units CAPS Take 50,000 Units by mouth every Thursday.    Yes Historical Provider, MD  collagenase (SANTYL) ointment Apply 1 application topically See admin instructions. Apply to wound bed (nickel thick) daily for wound care   Yes  Historical Provider, MD  docusate sodium (COLACE) 100 MG capsule Take 100 mg by mouth 3 (three) times daily.   Yes Historical Provider, MD  fluticasone (FLONASE) 50 MCG/ACT nasal spray Place 1 spray into both nostrils daily.   Yes Historical Provider, MD  Fluticasone-Salmeterol (ADVAIR) 100-50 MCG/DOSE AEPB Inhale 1 puff into the lungs 2 (two) times daily.   Yes Historical Provider, MD  furosemide (LASIX) 40 MG tablet Take 40 mg by mouth 2 (two) times daily.   Yes Historical Provider, MD  gabapentin (NEURONTIN) 400 MG capsule Take 400 mg by mouth 3 (three) times daily.  05/31/13  Yes Historical Provider, MD  hydrALAZINE (APRESOLINE) 25 MG tablet Take 25 mg by mouth 3 (three) times daily.    Yes Historical Provider, MD  hydrochlorothiazide (MICROZIDE) 12.5 MG capsule Take 12.5 mg by mouth daily.   Yes Historical Provider, MD  ipratropium-albuterol (DUONEB) 0.5-2.5 (3) MG/3ML SOLN Inhale 3 mLs into the lungs every 4 (four) hours as needed (shortness of breath/wheezing).  05/01/16  Yes Historical Provider, MD  iron polysaccharides (NIFEREX) 150 MG capsule Take 150 mg by mouth daily.   Yes Historical Provider, MD  isosorbide mononitrate (IMDUR) 60 MG 24 hr tablet Take 60 mg by mouth daily.  05/31/13  Yes Historical Provider, MD  levothyroxine (SYNTHROID, LEVOTHROID) 100 MCG tablet Take 100 mcg by mouth daily before breakfast.  05/31/13  Yes Historical Provider, MD  magnesium oxide (MAG-OX) 400 MG tablet Take 400 mg by mouth daily.   Yes Historical Provider, MD  meclizine (ANTIVERT) 25 MG tablet Take 25 mg by mouth 2 (two) times daily as needed for dizziness.  05/31/13  Yes Historical Provider, MD  metFORMIN (GLUCOPHAGE) 500 MG tablet Take 500 mg by mouth 2 (two) times daily with a meal.   Yes Historical Provider, MD  metolazone (ZAROXOLYN) 5 MG tablet Take 5 mg by mouth daily.   Yes Historical Provider, MD  metoprolol (LOPRESSOR) 50 MG tablet Take 50 mg by mouth 2 (two) times daily.  05/31/13  Yes Historical  Provider, MD  montelukast (SINGULAIR) 10 MG tablet Take 1 tablet by mouth at bedtime.    Yes Historical Provider, MD  nystatin (NYSTATIN) powder Apply topically See admin instructions. Apply to abdominal folds and groin topically twice daily   Yes Historical Provider, MD  oxycodone (OXY-IR) 5 MG capsule Take 5 mg by mouth every 4 (four) hours as needed.   Yes Historical Provider, MD  pantoprazole (PROTONIX) 40 MG tablet Take 40 mg by mouth daily.    Yes Historical Provider, MD  Rivaroxaban (XARELTO) 15 MG TABS tablet Take 1 tablet (15 mg total) by mouth 2 (two) times daily with a meal. 08/27/16  Yes Christina P Rama, MD  senna-docusate (SENOKOT-S) 8.6-50 MG tablet Take 2 tablets by mouth daily.   Yes Historical Provider, MD  Suvorexant (BELSOMRA) 10 MG TABS Take 10 mg by mouth at  bedtime.   Yes Historical Provider, MD  ziprasidone (GEODON) 80 MG capsule Take 80 mg by mouth 2 (two) times daily with a meal.   Yes Historical Provider, MD  torsemide (DEMADEX) 10 MG tablet Take 10 mg by mouth daily.    Historical Provider, MD     ALLERGIES:  Allergies  Allergen Reactions  . Cephalosporins Other (See Comments)    Patient doesn't recall the reaction  . Dilaudid [Hydromorphone Hcl] Other (See Comments)    "CANNOT HEAR, CANNOT THINK"    . Hydrocodone-Acetaminophen Nausea And Vomiting  . Morphine And Related Other (See Comments)    Patient was told by a doctor that this "would kill" him  . Tetracycline Other (See Comments)    Patient was told by a doctor that this "would kill" him  . Tetracyclines & Related Other (See Comments)    Patient doesn't recall the reaction  . Penicillins Rash    Has patient had a PCN reaction causing immediate rash, facial/tongue/throat swelling, SOB or lightheadedness with hypotension: Yes Has patient had a PCN reaction causing severe rash involving mucus membranes or skin necrosis: No Has patient had a PCN reaction that required hospitalization No Has patient had a  PCN reaction occurring within the last 10 years: No If all of the above answers are "NO", then may proceed with Cephalosporin use.   . Sulfa Antibiotics Rash     SOCIAL HISTORY:  Social History   Social History  . Marital status: Divorced    Spouse name: N/A  . Number of children: N/A  . Years of education: N/A   Occupational History  . disabled    Social History Main Topics  . Smoking status: Former Smoker    Quit date: 03/15/1963  . Smokeless tobacco: Never Used  . Alcohol use No  . Drug use: No  . Sexual activity: No   Other Topics Concern  . Not on file   Social History Narrative  . No narrative on file    FAMILY HISTORY:  Family History  Problem Relation Age of Onset  . Diabetes Mother   . Heart attack Father   . Diabetes Brother   . Heart attack Brother      REVIEW OF SYSTEMS:  Constitutional: denies weight loss, fever, chills, or sweats  Eyes: denies any other vision changes, history of eye injury  ENT: denies sore throat, hearing problems  Respiratory: denies shortness of breath, wheezing  Cardiovascular: denies chest pain, palpitations  Gastrointestinal: denies abdominal pain, N/V, or diarrhea Genitourinary: denies burning with urination or urinary frequency Musculoskeletal: denies any other joint pains or cramps  Skin: denies any other rashes or skin discolorations except as per HPI Neurological: denies any other headache, dizziness, weakness  Psychiatric: denies any other depression, anxiety   All other review of systems were negative   VITAL SIGNS:  Temp:  [97.4 F (36.3 C)-98.8 F (37.1 C)] 98.8 F (37.1 C) (05/01 0743) Pulse Rate:  [78-93] 81 (05/01 0743) Resp:  [20-26] 20 (05/01 0743) BP: (112-144)/(50-76) 128/55 (05/01 0743) SpO2:  [92 %-99 %] 94 % (05/01 0743) Weight:  [367 lb (166.5 kg)] 367 lb (166.5 kg) (04/30 1701)       Weight: (!) 367 lb (166.5 kg)     INTAKE/OUTPUT:  This shift: No intake/output data recorded.  Last 2  shifts: @IOLAST2SHIFTS @   PHYSICAL EXAM:  Constitutional:  -- Morbidly obese body habitus  -- Initially somnolent when first examined, though able to communicate he'd just recently  received narcotic pain medication, then subsequently awake, alert, oriented x3, and able to provide a rather detailed history consistent with his history otherwise documented in various notes in patient's EMR chart  Eyes:  -- Pupils equally round and reactive to light  -- No scleral icterus  Ear, nose, and throat:  -- No jugular venous distension  Pulmonary:  -- No crackles  -- Equal breath sounds bilaterally -- Breathing non-labored at rest Cardiovascular:  -- S1, S2 present  -- No pericardial rubs Gastrointestinal:  -- Abdomen soft, morbidly obese, nontender, nondistended, no guarding/rebound  -- No abdominal masses appreciated, pulsatile or otherwise  Musculoskeletal and Integumentary:  -- Wounds or skin discoloration: Right leg 24 cm (length) x 10 cm (width) to fascia on exam and CT imaging with Right knee stage 3 pressure sore that communicates with the infected hematoma, though separated by septations, scar tissue, and loculations limiting drainage and decompression + moderate tender fluctuance along the hematoma's length and overlying ecchymosis and cellulitis -- Extremities: B/L UE and LE FROM, hands and feet warm, B/L non-pitting edema vs obesity + Right leg 1+ pitting edema Neurologic:  -- Motor function: intact and symmetric -- Sensation: intact and symmetric  Labs:  CBC Latest Ref Rng & Units 09/22/2016 09/21/2016 08/27/2016  WBC 3.8 - 10.6 K/uL 12.3(H) 16.5(H) 12.7(H)  Hemoglobin 13.0 - 18.0 g/dL 10.5(L) 11.5(L) 10.9(L)  Hematocrit 40.0 - 52.0 % 33.8(L) 37.1(L) 36.6(L)  Platelets 150 - 440 K/uL 401 431 306   BMP:  Lab Results  Component Value Date   GLUCOSE 164 (H) 09/22/2016   GLUCOSE 130 (H) 08/22/2014   CO2 33 (H) 09/22/2016   CO2 27 08/22/2014   BUN 65 (H) 09/22/2016   BUN 11  08/22/2014   CREATININE 1.90 (H) 09/22/2016   CREATININE 0.82 08/22/2014   CALCIUM 9.1 09/22/2016   CALCIUM 8.5 (L) 08/22/2014     Imaging studies:  Right Leg CT Without Contrast (09/22/2016) - personally reviewed and interpreted prior to intervention 1. Skin thickening and soft tissue ulceration anteriorly, inferior to the patella. Large mildly dense fluid collection anteriorly, spanning from the inferior aspect of the patella to the midshaft of the tibia, suspect that this may represent a hematoma or possible infected hemorrhagic fluid collection. Further evaluation limited without contrast. 2. Healing nondisplaced proximal fibular fracture. No periostitis or bony erosive changes. 3. Moderate severe arthritis of the right knee.  Assessment/Plan: (ICD-10's: L02.415) 71 y.o. male with Right leg 24 cm x 10 cm large, painful, and partially draining infected and loculated hematoma (abscess) several weeks s/p traumatic fall with healing Right fibular fracture, complicated by anticoagulation for PE and antiplatelet therapy for CAD s/p CABG as well as by pertinent comorbidities including DM, HTN, CAD s/p CABG, CHF, COPD, OSA, osteoarthritis, and GERD with PUD.   - antibiotics as per medical team  - all risks, benefits, and alternatives to evacuation of infected partially draining Right leg hematoma discussed with the patient, all of his questions were answered to his expressed satisfaction, he elected to proceed, and informed consent was accordingly obtained at that time  - upon confirming patient and procedure with a brief timeout, suction canister and wound VAC were prepared, open partially draining stage 3 Right knee pressure wound was draped, and Yankauer suction was advanced into the loculated infected large hematoma and used to disrupt loculations/septations within the abscess cavity, which was likewise drained and suctioned using the Yankauer suction. >250 mL of purulent dark red fluid  consistent with infected old  hematoma was drained with immediate and substantial relief reported by the patient. Kerlix gauze roll was then packed into the cavity using the Yankauer suction, and a small negative pressure dressing was applied over the end of the Kerlix gauze roll overlying the Right knee stage 3 pressure wound to promote and maintain continuity of drainage from the entire wound cavity, and VAC dressing was placed to suction and seal was confirmed.  - okay with discharge per primary medical team and follow-up with patient's wound care team  All of the above findings and recommendations were discussed with the patient, orthopedic surgeon, patient's medical physician, and RN, and all of patient's questions were answered to his expressed satisfaction.  Thank you for the opportunity to participate in this patient's care.   -- Marilynne Drivers Rosana Hoes, MD, [place]: [facility] - Partnering for exceptional care. Office: [phone]

## 2016-09-22 NOTE — Progress Notes (Addendum)
Patient in bed alert and oriented eating meal. MD placed wound vac at bedside on left knee. Dressing clean dry and intact. Patient not complaining of any pain. Patient unable to do PT today. Patient reoriented to call bell system and room. Patient does not have VTE ordered, MD notified, no new orders.   [name], RN

## 2016-09-22 NOTE — Consult Note (Signed)
ORTHOPAEDIC CONSULTATION  REQUESTING PHYSICIAN: Bettey Costa, MD  Chief Complaint: Right leg swelling and redness  HPI: [name] is a 71 y.o. male who complains of  right leg swelling and redness.  He developed an ulceration along the medial side of his right knee several weeks ago.  Prior to that he had been treated for a nondisplaced proximal fibular fracture with an ankle-foot orthosis that did not reach this level.  He has been seen by the wound care clinic and was noted to have purulence yesterday.  He was referred to the emergency room.  White blood count was 16.5 thousand yesterday and is down to 12,300 today.  Potassium is 2.7 is being replaced.  Lactic acid is normal now.  Patient is morbidly obese at 360 pounds.  He has complex medical history including asthma, CHF, diabetes, heart disease, hypertension, COPD, pulmonary embolism, coronary bypass surgery 2, and stroke.  He has had cardiac surgery, gallbladder surgery, hernia repair, and kidney surgery.  He is allergic to cephalosporins and penicillins.  He is allergic to sulfa.  X-rays of the knee show a healed fibula fracture and mild arthritis.  CT of the lower leg showed soft tissue thickening and edema consistent with cellulitis.  There was some fluid accumulation inferior to the patella.  He has been afebrile.  He has been very somnolent laterally secondary to his COPD and lack of CPAP. Past Medical History:  Diagnosis Date  . Arthritis   . Asthma   . CHF (congestive heart failure) ([place])   . Diabetes ([place])   . Heart trouble   . History of stomach ulcers   . Hypertension   . 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 OSA (obstructive sleep apnea) 08/23/2016  . Pulmonary embolism ([place]) 08/23/2016  . S/P CABG x 2   . Skin cancer   . Stroke [facility])    Past Surgical History:  Procedure Laterality Date  . CARDIAC SURGERY    . CHOLECYSTECTOMY    . GALLBLADDER SURGERY    . HEART BYPASS    . HERNIA REPAIR    . IR ANGIOGRAM PULMONARY BILATERAL  SELECTIVE  08/23/2016  . IR ANGIOGRAM SELECTIVE EACH ADDITIONAL VESSEL  08/23/2016  . IR ANGIOGRAM SELECTIVE EACH ADDITIONAL VESSEL  08/23/2016  . IR INFUSION THROMBOL ARTERIAL INITIAL (MS)  08/23/2016  . IR INFUSION THROMBOL ARTERIAL INITIAL (MS)  08/23/2016  . IR THROMB F/U EVAL ART/VEN FINAL DAY (MS)  08/24/2016  . IR US GUIDE VASC ACCESS RIGHT  08/23/2016  . KIDNEY SURGERY     Social History   Social History  . Marital status: Divorced    Spouse name: N/A  . Number of children: N/A  . Years of education: N/A   Occupational History  . disabled    Social History Main Topics  . Smoking status: Former Smoker    Quit date: 03/15/1963  . Smokeless tobacco: Never Used  . Alcohol use No  . Drug use: No  . Sexual activity: No   Other Topics Concern  . None   Social History Narrative  . None   Family History  Problem Relation Age of Onset  . Diabetes Mother   . Heart attack Father   . Diabetes Brother   . Heart attack Brother    Allergies  Allergen Reactions  . Cephalosporins Other (See Comments)    Patient doesn't recall the reaction  . Dilaudid [Hydromorphone Hcl] Other (See Comments)    "CANNOT HEAR, CANNOT THINK"    .  Hydrocodone-Acetaminophen Nausea And Vomiting  . Morphine And Related Other (See Comments)    Patient was told by a doctor that this "would kill" him  . Tetracycline Other (See Comments)    Patient was told by a doctor that this "would kill" him  . Tetracyclines & Related Other (See Comments)    Patient doesn't recall the reaction  . Penicillins Rash    Has patient had a PCN reaction causing immediate rash, facial/tongue/throat swelling, SOB or lightheadedness with hypotension: Yes Has patient had a PCN reaction causing severe rash involving mucus membranes or skin necrosis: No Has patient had a PCN reaction that required hospitalization No Has patient had a PCN reaction occurring within the last 10 years: No If all of the above answers are "NO", then may  proceed with Cephalosporin use.   . Sulfa Antibiotics Rash   Prior to Admission medications   Medication Sig Start Date End Date Taking? Authorizing Provider  albuterol (PROAIR HFA) 108 (90 Base) MCG/ACT inhaler Inhale 2 puffs into the lungs every 4 (four) hours as needed for wheezing or shortness of breath.   Yes Historical Provider, MD  amLODipine (NORVASC) 10 MG tablet Take 10 mg by mouth daily.  05/31/13  Yes Historical Provider, MD  aspirin EC 81 MG tablet Take 81 mg by mouth daily.   Yes Historical Provider, MD  atorvastatin (LIPITOR) 80 MG tablet Take 80 mg by mouth every evening.   Yes Historical Provider, MD  buPROPion (WELLBUTRIN SR) 100 MG 12 hr tablet Take 100 mg by mouth daily.  05/31/13  Yes Historical Provider, MD  celecoxib (CELEBREX) 200 MG capsule Take 200 mg by mouth 2 (two) times daily.  05/21/16  Yes Historical Provider, MD  cetirizine (ZYRTEC) 10 MG tablet Take 10 mg by mouth daily.   Yes Historical Provider, MD  Cholecalciferol (VITAMIN D3) 50000 units CAPS Take 50,000 Units by mouth every Thursday.    Yes Historical Provider, MD  collagenase (SANTYL) ointment Apply 1 application topically See admin instructions. Apply to wound bed (nickel thick) daily for wound care   Yes Historical Provider, MD  docusate sodium (COLACE) 100 MG capsule Take 100 mg by mouth 3 (three) times daily.   Yes Historical Provider, MD  fluticasone (FLONASE) 50 MCG/ACT nasal spray Place 1 spray into both nostrils daily.   Yes Historical Provider, MD  Fluticasone-Salmeterol (ADVAIR) 100-50 MCG/DOSE AEPB Inhale 1 puff into the lungs 2 (two) times daily.   Yes Historical Provider, MD  furosemide (LASIX) 40 MG tablet Take 40 mg by mouth 2 (two) times daily.   Yes Historical Provider, MD  gabapentin (NEURONTIN) 400 MG capsule Take 400 mg by mouth 3 (three) times daily.  05/31/13  Yes Historical Provider, MD  hydrALAZINE (APRESOLINE) 25 MG tablet Take 25 mg by mouth 3 (three) times daily.    Yes Historical  Provider, MD  hydrochlorothiazide (MICROZIDE) 12.5 MG capsule Take 12.5 mg by mouth daily.   Yes Historical Provider, MD  ipratropium-albuterol (DUONEB) 0.5-2.5 (3) MG/3ML SOLN Inhale 3 mLs into the lungs every 4 (four) hours as needed (shortness of breath/wheezing).  05/01/16  Yes Historical Provider, MD  iron polysaccharides (NIFEREX) 150 MG capsule Take 150 mg by mouth daily.   Yes Historical Provider, MD  isosorbide mononitrate (IMDUR) 60 MG 24 hr tablet Take 60 mg by mouth daily.  05/31/13  Yes Historical Provider, MD  levothyroxine (SYNTHROID, LEVOTHROID) 100 MCG tablet Take 100 mcg by mouth daily before breakfast.  05/31/13  Yes Historical  Provider, MD  magnesium oxide (MAG-OX) 400 MG tablet Take 400 mg by mouth daily.   Yes Historical Provider, MD  meclizine (ANTIVERT) 25 MG tablet Take 25 mg by mouth 2 (two) times daily as needed for dizziness.  05/31/13  Yes Historical Provider, MD  metFORMIN (GLUCOPHAGE) 500 MG tablet Take 500 mg by mouth 2 (two) times daily with a meal.   Yes Historical Provider, MD  metolazone (ZAROXOLYN) 5 MG tablet Take 5 mg by mouth daily.   Yes Historical Provider, MD  metoprolol (LOPRESSOR) 50 MG tablet Take 50 mg by mouth 2 (two) times daily.  05/31/13  Yes Historical Provider, MD  montelukast (SINGULAIR) 10 MG tablet Take 1 tablet by mouth at bedtime.    Yes Historical Provider, MD  nystatin (NYSTATIN) powder Apply topically See admin instructions. Apply to abdominal folds and groin topically twice daily   Yes Historical Provider, MD  oxycodone (OXY-IR) 5 MG capsule Take 5 mg by mouth every 4 (four) hours as needed.   Yes Historical Provider, MD  pantoprazole (PROTONIX) 40 MG tablet Take 40 mg by mouth daily.    Yes Historical Provider, MD  Rivaroxaban (XARELTO) 15 MG TABS tablet Take 1 tablet (15 mg total) by mouth 2 (two) times daily with a meal. 08/27/16  Yes Christina P Rama, MD  senna-docusate (SENOKOT-S) 8.6-50 MG tablet Take 2 tablets by mouth daily.   Yes Historical  Provider, MD  Suvorexant (BELSOMRA) 10 MG TABS Take 10 mg by mouth at bedtime.   Yes Historical Provider, MD  ziprasidone (GEODON) 80 MG capsule Take 80 mg by mouth 2 (two) times daily with a meal.   Yes Historical Provider, MD  torsemide (DEMADEX) 10 MG tablet Take 10 mg by mouth daily.    Historical Provider, MD   Ct Knee Right Wo Contrast  Result Date: 09/22/2016 CLINICAL DATA:  Large open wound below the knee EXAM: CT OF THE right KNEE WITHOUT CONTRAST TECHNIQUE: Multidetector CT imaging of the right knee was performed according to the standard protocol. Multiplanar CT image reconstructions were also generated. COMPARISON:  Radiograph 09/21/2016 FINDINGS: Bones/Joint/Cartilage Healing nondisplaced proximal fibular fracture with sclerotic margins. Moderate severe narrowing of the medial joint space compartment with subchondral sclerosis and bony spurring. Mild degenerative changes of the lateral compartment with bony spurring. Moderate to marked patellofemoral degenerative changes greater on the lateral side with medial and lateral osteophytes. Superior and inferior patellar spurring. No large suprapatellar effusion. No obvious calcified loose bodies within the joint. Ligaments Suboptimally assessed by CT. Muscles and Tendons Diffuse fatty atrophy of the distal thigh and proximal calf muscles. Quadriceps tendon grossly intact. Patellar tendon grossly intact. Soft tissues Large amount of soft tissue edema and skin thickening anterior to the patella, lower knee, and proximal tibia and fibula. Soft tissue ulceration slightly inferior to the patella with scattered gas pockets in the anterior soft tissues. Large mildly hyperdense collection within the subcutaneous soft tissues anteriorly, this begins approximately at the inferior aspect of the patella and extends inferiorly to about the midshaft of the tibia. This measures approximately 20 more cm craniocaudad by 3.8 cm AP by 7.4 cm transverse. There is diffuse  subcutaneous edema within the posterior as well. IMPRESSION: 1. Skin thickening and soft tissue ulceration anteriorly, inferior to the patella. Large mildly dense fluid collection anteriorly, spanning from the inferior aspect of the patella to the midshaft of the tibia, suspect that this may represent a hematoma or possible infected hemorrhagic fluid collection. Further evaluation limited without  contrast. 2. Healing nondisplaced proximal fibular fracture. No periostitis or bony erosive changes. 3. Moderate severe arthritis of the right knee. Electronically Signed   By: Donavan Foil M.D.   On: 09/22/2016 01:14   Dg Knee Complete 4 Views Right  Result Date: 09/21/2016 CLINICAL DATA:  Right knee wound. EXAM: RIGHT KNEE - COMPLETE 4+ VIEW COMPARISON:  Radiographs of July 20, 2016. FINDINGS: Minimally displaced fracture is seen involving the proximal fibula. Severe narrowing of medial joint space is noted with osteophyte formation. Moderate narrowing of patellofemoral space is noted with osteophyte formation. No joint effusion is noted. Soft tissue ulceration is seen anterior to the knee. IMPRESSION: Soft tissue ulceration or wound is noted anteriorly. Severe degenerative joint disease is noted. Minimally displaced proximal right fibular fracture. Electronically Signed   By: Marijo Conception, M.D.   On: 09/21/2016 19:47    Positive ROS: All other systems have been reviewed and were otherwise negative with the exception of those mentioned in the HPI and as above.  Physical Exam: General: Patient is somnolent but arouses to stimulation.  He falls asleep easily. Cardiovascular: No pedal edema Respiratory: Somewhat labored breathing. GI: No organomegaly, abdomen is soft and non-tender Skin: No lesions in the area of chief complaint Neurologic: Sensation intact distally Psychiatric: Patient is competent for consent with normal mood and affect Lymphatic: No axillary or cervical  lymphadenopathy  MUSCULOSKELETAL: Right leg has some soft tissue swelling and mild redness.  There is a ulceration medial to the knee, which is superficial.  The some when necessary fluid can be expressed from this.  Minimal warmth or pain.  Neurological is grossly intact distally.  Assessment: Cellulitis right leg, with draining ulceration medial to the knee.  Plan: Clinically the patient is responding to IV antibiotics.  A case could be made for further conservative treatment or irrigation and debridement.  He is certainly a high risk patient for surgery.  The hospital's general surgery service is going to evaluate the patient as well.  There is no orthopedic issue here concerning bone or joint.  I would be glad to let them assume care of the case.    Park Breed, MD [phone]   09/22/2016 12:51 PM

## 2016-09-22 NOTE — Progress Notes (Signed)
Repeat lactic acid decreasing CT right knee reviewed Has large hematoma Hold xarelto Orthopedic and surgery consultation Monitor hemoglobin and hemtocrit

## 2016-09-22 NOTE — Progress Notes (Signed)
HAL, NORRINGTON ([id number]) Visit Report for 09/21/2016 Arrival Information Details Patient Name: [name], [name]. Date of Service: 09/21/2016 3:30 PM Medical Record Number: [id number] Patient Account Number: [id number] Date of Birth/Sex: 29-Jun-1945 (71 y.o. Male) Treating RN: Ahmed Prima Primary Care Sebastiana Wuest: [name] Dupre Other Clinician: Referring Keng Jewel: [name] Dupre Treating Faigy Stretch/Extender: Frann Rider in Treatment: 5 Visit Information History Since Last Visit All ordered tests and consults were completed: No Patient Arrived: Wheel Chair Added or deleted any medications: No Arrival Time: 15:47 Any new allergies or adverse reactions: No Accompanied By: self Had a fall or experienced change in No Transfer Assistance: EasyPivot activities of daily living that may affect Patient Lift risk of falls: Patient Identification Verified: Yes Signs or symptoms of abuse/neglect since last No Secondary Verification Process Yes visito Completed: Hospitalized since last visit: No Patient Requires Transmission- No Has Dressing in Place as Prescribed: Yes Based Precautions: Pain Present Now: Yes Patient Has Alerts: No Electronic Signature(s) Signed: 09/21/2016 4:47:36 PM By: Alric Quan Entered By: Alric Quan on 09/21/2016 15:50:21 Bushnell, [name] ([id number]) -------------------------------------------------------------------------------- Clinic Level of Care Assessment Details Patient Name: [name]. Date of Service: 09/21/2016 3:30 PM Medical Record Number: [id number] Patient Account Number: [id number] Date of Birth/Sex: [birth year] (71 y.o. Male) Treating RN: Ahmed Prima Primary Care Manjot Beumer: [name] Dupre Other Clinician: Referring Deveron Shamoon: [name] Dupre Treating Charnell Peplinski/Extender: Frann Rider in Treatment: 5 Clinic Level of Care Assessment Items TOOL 4 Quantity Score X - Use when only an EandM is performed  on FOLLOW-UP visit 1 0 ASSESSMENTS - Nursing Assessment / Reassessment X - Reassessment of Co-morbidities (includes updates in patient status) 1 10 X - Reassessment of Adherence to Treatment Plan 1 5 ASSESSMENTS - Wound and Skin Assessment / Reassessment X - Simple Wound Assessment / Reassessment - one wound 1 5 []  - Complex Wound Assessment / Reassessment - multiple wounds 0 []  - Dermatologic / Skin Assessment (not related to wound area) 0 ASSESSMENTS - Focused Assessment []  - Circumferential Edema Measurements - multi extremities 0 []  - Nutritional Assessment / Counseling / Intervention 0 []  - Lower Extremity Assessment (monofilament, tuning fork, pulses) 0 []  - Peripheral Arterial Disease Assessment (using hand held doppler) 0 ASSESSMENTS - Ostomy and/or Continence Assessment and Care []  - Incontinence Assessment and Management 0 []  - Ostomy Care Assessment and Management (repouching, etc.) 0 PROCESS - Coordination of Care []  - Simple Patient / Family Education for ongoing care 0 X - Complex (extensive) Patient / Family Education for ongoing care 1 20 X - Staff obtains Programmer, systems, Records, Test Results / Process Orders 1 10 X - Staff telephones HHA, Nursing Homes / Clarify orders / etc 1 10 X - Routine Transfer to another Facility (non-emergent condition) 1 10 [name], [name] ([id number]) []  - Routine Hospital Admission (non-emergent condition) 0 []  - New Admissions / Biomedical engineer / Ordering NPWT, Apligraf, etc. 0 []  - Emergency Hospital Admission (emergent condition) 0 X - Simple Discharge Coordination 1 10 []  - Complex (extensive) Discharge Coordination 0 PROCESS - Special Needs []  - Pediatric / Minor Patient Management 0 []  - Isolation Patient Management 0 []  - Hearing / Language / Visual special needs 0 []  - Assessment of Community assistance (transportation, D/C planning, etc.) 0 []  - Additional assistance / Altered mentation 0 []  - Support Surface(s) Assessment  (bed, cushion, seat, etc.) 0 INTERVENTIONS - Wound Cleansing / Measurement []  - Simple Wound Cleansing - one wound 0 X - Complex Wound Cleansing - multiple wounds 1  5 X - Wound Imaging (photographs - any number of wounds) 1 5 []  - Wound Tracing (instead of photographs) 0 []  - Simple Wound Measurement - one wound 0 X - Complex Wound Measurement - multiple wounds 1 5 INTERVENTIONS - Wound Dressings []  - Small Wound Dressing one or multiple wounds 0 []  - Medium Wound Dressing one or multiple wounds 0 X - Large Wound Dressing one or multiple wounds 1 20 X - Application of Medications - topical 1 5 []  - Application of Medications - injection 0 INTERVENTIONS - Miscellaneous []  - External ear exam 0 [name], [name] ([id number]) []  - Specimen Collection (cultures, biopsies, blood, body fluids, etc.) 0 []  - Specimen(s) / Culture(s) sent or taken to Lab for analysis 0 X - Patient Transfer (multiple staff / Harrel Lemon Lift / Similar devices) 1 10 []  - Simple Staple / Suture removal (25 or less) 0 []  - Complex Staple / Suture removal (26 or more) 0 []  - Hypo / Hyperglycemic Management (close monitor of Blood Glucose) 0 []  - Ankle / Brachial Index (ABI) - do not check if billed separately 0 X - Vital Signs 1 5 Has the patient been seen at the hospital within the last three years: Yes Total Score: 135 Level Of Care: New/Established - Level 4 Electronic Signature(s) Signed: 09/21/2016 4:47:36 PM By: Alric Quan Entered By: Alric Quan on 09/21/2016 16:38:45 [name], [name] ([id number]) -------------------------------------------------------------------------------- General Visit Notes Details Patient Name: [name]. Date of Service: 09/21/2016 3:30 PM Medical Record Number: [id number] Patient Account Number: [id number] Date of Birth/Sex: 1945-10-07 (71 y.o. Male) Treating RN: Ahmed Prima Primary Care Shakeem Stern: [name] Dupre Other Clinician: Cornell Barman Referring Keithan Dileonardo:  [name] Dupre Treating Dezeray Puccio/Extender: Frann Rider in Treatment: 5 Notes I took the wound vac off and started to clean wound and then purulent, serosanguinous drainage started pouring our if the wound from the lateral side with chunks of white stuff and blood clots. I called for another nurse Maudie Mercury, RN came in the room I at that time had already changed one chux, we then had to change it again, and then a third time. I went and got Dr. Con Memos and Dr. Con Memos suggested pt to go to ER so EMS was called pt went non=emergent traffic to [facility]. Electronic Signature(s) Signed: 09/21/2016 4:47:36 PM By: Alric Quan Entered By: Alric Quan on 09/21/2016 16:42:57 [name], [name] ([id number]) -------------------------------------------------------------------------------- Lower Extremity Assessment Details Patient Name: [name] Gathers T. Date of Service: 09/21/2016 3:30 PM Medical Record Number: [id number] Patient Account Number: [id number] Date of Birth/Sex: [birth year] (71 y.o. Male) Treating RN: Ahmed Prima Primary Care Burnell Hurta: [name] Dupre Other Clinician: Referring Jordany Russett: [name] Dupre Treating Brelynn Wheller/Extender: Frann Rider in Treatment: 5 Vascular Assessment Pulses: Dorsalis Pedis Palpable: [Right:Yes] Posterior Tibial Extremity colors, hair growth, and conditions: Extremity Color: [Right:Normal] Temperature of Extremity: [Right:Warm] Capillary Refill: [Right:< 3 seconds] Electronic Signature(s) Signed: 09/21/2016 4:47:36 PM By: Alric Quan Entered By: Alric Quan on 09/21/2016 16:21:44 [name], [name] ([id number]) -------------------------------------------------------------------------------- Multi Wound Chart Details Patient Name: [name] Gathers T. Date of Service: 09/21/2016 3:30 PM Medical Record Number: [id number] Patient Account Number: [id number] Date of Birth/Sex: 01-Dec-1945 (71 y.o. Male) Treating RN: Ahmed Prima Primary Care Anyely Cunning: [name] Dupre Other Clinician: Referring Wang Granada: [name] Dupre Treating Elick Aguilera/Extender: Frann Rider in Treatment: 5 Vital Signs Height(in): 67 Pulse(bpm): 75 Weight(lbs): 357 Blood Pressure 114/58 (mmHg): Body Mass Index(BMI): 56 Temperature(F): 97.4 Respiratory Rate 16 (breaths/min): Photos: [1:No Photos] [N/A:N/A] Wound Location: [1:Right Knee - Anterior] [N/A:N/A]  Wounding Event: [1:Pressure Injury] [N/A:N/A] Primary Etiology: [1:Diabetic Wound/Ulcer of the Lower Extremity] [N/A:N/A] Comorbid History: [1:Anemia, Asthma, Arrhythmia, Congestive Heart Failure, Coronary Artery Disease, Hypertension, Type II Diabetes, Osteoarthritis] [N/A:N/A] Date Acquired: [1:07/23/2016] [N/A:N/A] Weeks of Treatment: [1:5] [N/A:N/A] Wound Status: [1:Open] [N/A:N/A] Measurements L x W x D 1.4x4.6x0.5 [N/A:N/A] (cm) Area (cm) : [1:5.058] [N/A:N/A] Volume (cm) : [1:2.529] [N/A:N/A] % Reduction in Area: [1:35.60%] [N/A:N/A] % Reduction in Volume: -222.20% [N/A:N/A] Starting Position 1 11 (o'clock): Ending Position 1 [1:7] (o'clock): Maximum Distance 1 7.8 (cm): Undermining: [1:Yes] [N/A:N/A] Classification: [1:Grade 1] [N/A:N/A] Exudate Amount: [1:Large] [N/A:N/A] Exudate Type: [1:Purulent] [N/A:N/A] Exudate Color: yellow, brown, green N/A N/A Wound Margin: Flat and Intact N/A N/A Granulation Amount: Large (67-100%) N/A N/A Necrotic Amount: Small (1-33%) N/A N/A Exposed Structures: Fat Layer (Subcutaneous N/A N/A Tissue) Exposed: Yes Fascia: No Tendon: No Muscle: No Joint: No Bone: No Epithelialization: None N/A N/A Periwound Skin Texture: Excoriation: No N/A N/A Induration: No Callus: No Crepitus: No Rash: No Scarring: No Periwound Skin Maceration: No N/A N/A Moisture: Dry/Scaly: No Periwound Skin Color: Erythema: Yes N/A N/A Atrophie Blanche: No Cyanosis: No Ecchymosis: No Hemosiderin Staining: No Mottled:  No Pallor: No Rubor: No Erythema Location: Circumferential N/A N/A Temperature: No Abnormality N/A N/A Tenderness on Yes N/A N/A Palpation: Wound Preparation: Ulcer Cleansing: N/A N/A Rinsed/Irrigated with Saline Topical Anesthetic Applied: Other: Lidocaine 4% Treatment Notes Electronic Signature(s) Signed: 09/21/2016 4:26:25 PM By: Christin Fudge MD, FACS Entered By: Christin Fudge on 09/21/2016 16:26:25 [name] ([id number]) -------------------------------------------------------------------------------- [place] Details Patient Name: [name] Gathers T. Date of Service: 09/21/2016 3:30 PM Medical Record Number: [id number] Patient Account Number: [id number] Date of Birth/Sex: [birth year] (71 y.o. Male) Treating RN: Carolyne Fiscal, Debi Primary Care Charrisse Masley: [name] Dupre Other Clinician: Referring Duan Scharnhorst: [name] Dupre Treating Donicia Druck/Extender: Frann Rider in Treatment: 5 Active Inactive ` Orientation to the Wound Care Program Nursing Diagnoses: Knowledge deficit related to the wound healing center program Goals: Patient/caregiver will verbalize understanding of the [place] Program Date Initiated: 08/14/2016 Target Resolution Date: 12/14/2016 Goal Status: Active Interventions: Provide education on orientation to the wound center Notes: ` Pressure Nursing Diagnoses: Knowledge deficit related to causes and risk factors for pressure ulcer development Knowledge deficit related to management of pressures ulcers Potential for impaired tissue integrity related to pressure, friction, moisture, and shear Goals: Patient will remain free from development of additional pressure ulcers Date Initiated: 08/14/2016 Target Resolution Date: 12/14/2016 Goal Status: Active Patient will remain free of pressure ulcers Date Initiated: 08/14/2016 Target Resolution Date: 12/14/2016 Goal Status: Active Patient/caregiver will verbalize risk  factors for pressure ulcer development Date Initiated: 08/14/2016 Target Resolution Date: 12/14/2016 Goal Status: Active Patient/caregiver will verbalize understanding of pressure ulcer management Date Initiated: 08/14/2016 Target Resolution Date: 12/14/2016 Goal Status: Active [name], [name] ([id number]) Interventions: Assess: immobility, friction, shearing, incontinence upon admission and as needed Assess offloading mechanisms upon admission and as needed Assess potential for pressure ulcer upon admission and as needed Provide education on pressure ulcers Treatment Activities: Patient referred for seating evaluation to ensure proper offloading : 08/14/2016 Pressure reduction/relief device ordered : 08/14/2016 Notes: ` Wound/Skin Impairment Nursing Diagnoses: Impaired tissue integrity Knowledge deficit related to ulceration/compromised skin integrity Goals: Patient/caregiver will verbalize understanding of skin care regimen Date Initiated: 08/14/2016 Target Resolution Date: 12/14/2016 Goal Status: Active Ulcer/skin breakdown will have a volume reduction of 30% by week 4 Date Initiated: 08/14/2016 Target Resolution Date: 12/14/2016 Goal Status: Active Ulcer/skin breakdown will have a volume reduction of 50%  by week 8 Date Initiated: 08/14/2016 Target Resolution Date: 12/14/2016 Goal Status: Active Ulcer/skin breakdown will have a volume reduction of 80% by week 12 Date Initiated: 08/14/2016 Target Resolution Date: 12/14/2016 Goal Status: Active Ulcer/skin breakdown will heal within 14 weeks Date Initiated: 08/14/2016 Target Resolution Date: 12/14/2016 Goal Status: Active Interventions: Assess patient/caregiver ability to obtain necessary supplies Assess patient/caregiver ability to perform ulcer/skin care regimen upon admission and as needed Assess ulceration(s) every visit Provide education on ulcer and skin care Treatment Activities: Referred to DME Jennie Hannay for dressing  supplies : 08/14/2016 Skin care regimen initiated : 08/14/2016 [name], [name] ([id number]) Topical wound management initiated : 08/14/2016 Notes: Electronic Signature(s) Signed: 09/21/2016 4:47:36 PM By: Alric Quan Entered By: Alric Quan on 09/21/2016 16:21:48 [name], [name] ([id number]) -------------------------------------------------------------------------------- Pain Assessment Details Patient Name: [name] Gathers T. Date of Service: 09/21/2016 3:30 PM Medical Record Number: [id number] Patient Account Number: [id number] Date of Birth/Sex: Sep 01, 1945 (71 y.o. Male) Treating RN: Ahmed Prima Primary Care Leina Babe: [name] Dupre Other Clinician: Referring Ulyana Pitones: [name] Dupre Treating Rubee Vega/Extender: Frann Rider in Treatment: 5 Active Problems Location of Pain Severity and Description of Pain Patient Has Paino Yes Site Locations Pain Location: Pain in Ulcers With Dressing Change: Yes Rate the pain. Current Pain Level: 6 Character of Pain Describe the Pain: Aching Pain Management and Medication Current Pain Management: Electronic Signature(s) Signed: 09/21/2016 4:47:36 PM By: Alric Quan Entered By: Alric Quan on 09/21/2016 15:50:32 [name], [name] ([id number]) -------------------------------------------------------------------------------- Wound Assessment Details Patient Name: [name] Gathers T. Date of Service: 09/21/2016 3:30 PM Medical Record Number: [id number] Patient Account Number: [id number] Date of Birth/Sex: 1945/10/14 (71 y.o. Male) Treating RN: Ahmed Prima Primary Care Maylene Crocker: [name] Dupre Other Clinician: Referring Dahmir Epperly: [name] Dupre Treating Abigaile Rossie/Extender: Frann Rider in Treatment: 5 Wound Status Wound Number: 1 Primary Diabetic Wound/Ulcer of the Lower Etiology: Extremity Wound Location: Right Knee - Anterior Wound Open Wounding Event: Pressure Injury Status: Date  Acquired: 07/23/2016 Comorbid Anemia, Asthma, Arrhythmia, Weeks Of Treatment: 5 History: Congestive Heart Failure, Coronary Clustered Wound: No Artery Disease, Hypertension, Type II Diabetes, Osteoarthritis Photos Photo Uploaded By: Alric Quan on 09/21/2016 16:45:02 Wound Measurements Length: (cm) 1.4 % Reduction in Width: (cm) 4.6 % Reduction in Depth: (cm) 0.5 Epithelializati Area: (cm) 5.058 Undermining: Volume: (cm) 2.529 Starting Po Ending Posit Maximum Dist Area: 35.6% Volume: -222.2% on: None Yes sition (o'clock): 11 ion (o'clock): 7 ance: (cm) 7.8 Wound Description Classification: Grade 1 Foul Odor After Wound Margin: Flat and Intact Slough/Fibrino Exudate Amount: Large Exudate Type: Purulent Exudate Color: yellow, brown, green Cleansing: No Yes Wound Bed [name], [name] ([id number]) Granulation Amount: Large (67-100%) Exposed Structure Necrotic Amount: Small (1-33%) Fascia Exposed: No Necrotic Quality: Adherent Slough Fat Layer (Subcutaneous Tissue) Exposed: Yes Tendon Exposed: No Muscle Exposed: No Joint Exposed: No Bone Exposed: No Periwound Skin Texture Texture Color No Abnormalities Noted: No No Abnormalities Noted: No Callus: No Atrophie Blanche: No Crepitus: No Cyanosis: No Excoriation: No Ecchymosis: No Induration: No Erythema: Yes Rash: No Erythema Location: Circumferential Scarring: No Hemosiderin Staining: No Mottled: No Moisture Pallor: No No Abnormalities Noted: No Rubor: No Dry / Scaly: No Maceration: No Temperature / Pain Temperature: No Abnormality Tenderness on Palpation: Yes Wound Preparation Ulcer Cleansing: Rinsed/Irrigated with Saline Topical Anesthetic Applied: Other: Lidocaine 4%, Electronic Signature(s) Signed: 09/21/2016 4:47:36 PM By: Alric Quan Entered By: Alric Quan on 09/21/2016 16:03:41 [name], [name]  ([id number]) -------------------------------------------------------------------------------- Vitals Details Patient Name: [name] Gathers T. Date of Service: 09/21/2016 3:30 PM Medical Record  Number: [id number] Patient Account Number: [id number] Date of Birth/Sex: Mar 17, 1946 (72 y.o. Male) Treating RN: Carolyne Fiscal, Debi Primary Care Demetric Dunnaway: [name] Dupre Other Clinician: Referring Hurshell Dino: [name] Dupre Treating Rickie Gutierres/Extender: Frann Rider in Treatment: 5 Vital Signs Time Taken: 15:50 Temperature (F): 97.4 Height (in): 67 Pulse (bpm): 75 Weight (lbs): 357 Respiratory Rate (breaths/min): 16 Body Mass Index (BMI): 55.9 Blood Pressure (mmHg): 114/58 Reference Range: 80 - 120 mg / dl Electronic Signature(s) Signed: 09/21/2016 4:47:36 PM By: Alric Quan Entered By: Alric Quan on 09/21/2016 15:54:12

## 2016-09-22 NOTE — Progress Notes (Signed)
Dr Estanislado Pandy notified of low blood pressure 98/31. Received order to hold blood pressure medication tonight

## 2016-09-22 NOTE — Clinical Social Work Note (Signed)
Clinical Social Work Assessment  Patient Details  Name: [name] MRN: [id number] Date of Birth: [birth year]  Date of referral:  09/22/16               Reason for consult:  Other (Comment Required) (From [facility] )                Permission sought to share information with:  Chartered certified accountant granted to share information::  Yes, Verbal Permission Granted  Name::      Company secretary::   [place]   Relationship::     Contact Information:     Housing/Transportation Living arrangements for the past 2 months:  [place], [place] of Information:  Patient, Facility Patient Interpreter Needed:  None Criminal Activity/Legal Involvement Pertinent to Current Situation/Hospitalization:  No - Comment as needed Significant Relationships:  Adult Children, Other Family Members Lives with:  Facility Resident Do you feel safe going back to the place where you live?  Yes Need for family participation in patient care:  Yes (Comment)  Care giving concerns:  Patient is a long term care resident at [facility].      Social Worker assessment / plan:  Holiday representative ([place]) reviewed chart and noted that patient is from [facility]. Per [facility] admissions coordinator at [facility] patient is a long term care resident and can return when stable. CSW met with patient alone at bedside to discuss D/C plan. Patient was alert and oriented X4 and reported that he has been at [facility] for several weeks. Per patient he was placed at [facility] from [place] at the beginning of April 2018. Patient reported that his daughter [name] is her HPOA and primary support. Patient is agreeable to return to [facility]. FL2 complete. CSW will continue to follow and assist as needed.     Employment status:  Retired Forensic scientist:  Medicaid In [place],  [employer] PT Recommendations:  Not assessed at this time [place] / Referral to community resources:  [place]  Patient/Family's Response to care:  Patient is agreeable to return to [facility].   Patient/Family's Understanding of and Emotional Response to Diagnosis, Current Treatment, and Prognosis:  Patient was pleasant and thanked CSW for visit.   Emotional Assessment Appearance:  Appears stated age Attitude/Demeanor/Rapport:    Affect (typically observed):  Accepting, Adaptable, Pleasant Orientation:  Oriented to Self, Oriented to Place, Oriented to  Time, Oriented to Situation Alcohol / Substance use:  Not Applicable Psych involvement (Current and /or in the community):  No (Comment)  Discharge Needs  Concerns to be addressed:  Discharge Planning Concerns Readmission within the last 30 days:  No Current discharge risk:  Dependent with Mobility Barriers to Discharge:  Continued Medical Work up   [employer], [employer], LCSW 09/22/2016, 10:00 AM

## 2016-09-22 NOTE — ED Notes (Signed)
Date and time results received: 09/22/16 0017 (use smartphrase ".now" to insert current time)  Test: Lactic acid 2.7 Critical Value: 2.7  Name of Provider Notified: Dr. Estanislado Pandy  Orders Received? Or Actions Taken?: Actions Taken: No

## 2016-09-22 NOTE — Progress Notes (Addendum)
[facility] NAME: [employer]    MR#:  [id number]  DATE OF BIRTH:  1946/02/13  SUBJECTIVE:   Patient sleeping soundly   REVIEW OF SYSTEMS:   patient snoring/sleeping  Does not want to wake up to talk  Tolerating Diet: yes      DRUG ALLERGIES:   Allergies  Allergen Reactions  . Cephalosporins Other (See Comments)    Patient doesn't recall the reaction  . Dilaudid [Hydromorphone Hcl] Other (See Comments)    "CANNOT HEAR, CANNOT THINK"    . Hydrocodone-Acetaminophen Nausea And Vomiting  . Morphine And Related Other (See Comments)    Patient was told by a doctor that this "would kill" him  . Tetracycline Other (See Comments)    Patient was told by a doctor that this "would kill" him  . Tetracyclines & Related Other (See Comments)    Patient doesn't recall the reaction  . Penicillins Rash    Has patient had a PCN reaction causing immediate rash, facial/tongue/throat swelling, SOB or lightheadedness with hypotension: Yes Has patient had a PCN reaction causing severe rash involving mucus membranes or skin necrosis: No Has patient had a PCN reaction that required hospitalization No Has patient had a PCN reaction occurring within the last 10 years: No If all of the above answers are "NO", then may proceed with Cephalosporin use.   . Sulfa Antibiotics Rash    VITALS:  Blood pressure (!) 128/55, pulse 81, temperature 98.8 F (37.1 C), temperature source Oral, resp. rate 20, height 5\' 6"  (1.676 m), weight (!) 166.5 kg (367 lb), SpO2 94 %.  PHYSICAL EXAMINATION:  Constitutional: Appears Morbidly obese. No distress. HENT: Normocephalic. .  Eyes:  no scleral icterus.  Neck: Normal ROM. Neck supple. No JVD. No tracheal deviation. CVS: RRR, S1/S2 +, no murmurs, no gallops, no carotid bruit.  Pulmonary: Effort and breath sounds normal, no stridor, rhonchi, wheezes, rales.  Abdominal: Soft. BS +,  no distension, tenderness,  rebound or guarding.  Musculoskeletal: Moves extremities   Right knee and lower leg with edema  Neuro: Sleeping Skin:  Skin: Right knee with erythema and swelling  Psychiatric: Sleeping.      LABORATORY PANEL:   CBC  Recent Labs Lab 09/22/16 0329  WBC 12.3*  HGB 10.5*  HCT 33.8*  PLT 401   ------------------------------------------------------------------------------------------------------------------  Chemistries   Recent Labs Lab 09/21/16 1919 09/22/16 0329  NA 131* 132*  K 3.4* 2.7*  CL 84* 88*  CO2 31 33*  GLUCOSE 244* 164*  BUN 72* 65*  CREATININE 2.15* 1.90*  CALCIUM 9.3 9.1  MG  --  2.4  AST 35  --   ALT 24  --   ALKPHOS 86  --   BILITOT 0.6  --    ------------------------------------------------------------------------------------------------------------------  Cardiac Enzymes No results for input(s): TROPONINI in the last 168 hours. ------------------------------------------------------------------------------------------------------------------  RADIOLOGY:  Ct Knee Right Wo Contrast  Result Date: 09/22/2016 CLINICAL DATA:  Large open wound below the knee EXAM: CT OF THE right KNEE WITHOUT CONTRAST TECHNIQUE: Multidetector CT imaging of the right knee was performed according to the standard protocol. Multiplanar CT image reconstructions were also generated. COMPARISON:  Radiograph 09/21/2016 FINDINGS: Bones/Joint/Cartilage Healing nondisplaced proximal fibular fracture with sclerotic margins. Moderate severe narrowing of the medial joint space compartment with subchondral sclerosis and bony spurring. Mild degenerative changes of the lateral compartment with bony spurring. Moderate to marked patellofemoral degenerative changes greater on the lateral side with medial  and lateral osteophytes. Superior and inferior patellar spurring. No large suprapatellar effusion. No obvious calcified loose bodies within the joint. Ligaments Suboptimally assessed by CT.  Muscles and Tendons Diffuse fatty atrophy of the distal thigh and proximal calf muscles. Quadriceps tendon grossly intact. Patellar tendon grossly intact. Soft tissues Large amount of soft tissue edema and skin thickening anterior to the patella, lower knee, and proximal tibia and fibula. Soft tissue ulceration slightly inferior to the patella with scattered gas pockets in the anterior soft tissues. Large mildly hyperdense collection within the subcutaneous soft tissues anteriorly, this begins approximately at the inferior aspect of the patella and extends inferiorly to about the midshaft of the tibia. This measures approximately 20 more cm craniocaudad by 3.8 cm AP by 7.4 cm transverse. There is diffuse subcutaneous edema within the posterior as well. IMPRESSION: 1. Skin thickening and soft tissue ulceration anteriorly, inferior to the patella. Large mildly dense fluid collection anteriorly, spanning from the inferior aspect of the patella to the midshaft of the tibia, suspect that this may represent a hematoma or possible infected hemorrhagic fluid collection. Further evaluation limited without contrast. 2. Healing nondisplaced proximal fibular fracture. No periostitis or bony erosive changes. 3. Moderate severe arthritis of the right knee. Electronically Signed   By: Donavan Foil M.D.   On: 09/22/2016 01:14   Dg Knee Complete 4 Views Right  Result Date: 09/21/2016 CLINICAL DATA:  Right knee wound. EXAM: RIGHT KNEE - COMPLETE 4+ VIEW COMPARISON:  Radiographs of July 20, 2016. FINDINGS: Minimally displaced fracture is seen involving the proximal fibula. Severe narrowing of medial joint space is noted with osteophyte formation. Moderate narrowing of patellofemoral space is noted with osteophyte formation. No joint effusion is noted. Soft tissue ulceration is seen anterior to the knee. IMPRESSION: Soft tissue ulceration or wound is noted anteriorly. Severe degenerative joint disease is noted. Minimally  displaced proximal right fibular fracture. Electronically Signed   By: Marijo Conception, M.D.   On: 09/21/2016 19:47     ASSESSMENT AND PLAN:    71 year old morbidly obese man with OSA who was sent by wound care physician for right knee hematoma.  1. Right knee hematoma/cellulitis: I spoke with orthopedics and surgery consultants he will come and evaluate patient. Currently on Levaquin/clindamycin  Wound care:Cleanse right knee with NS and pat gently dry.  Apply Aquacel Ag to right knee.  Change on MOnday and Thursday and PRN soilage.   2. OSA: Start CPAP  3. Diabetes: Continue ADA diet with sliding scale insulin  4. Chronic anemia on iron 5. Hyperlipidemia: Continue statin  6. Depression: Continue Wellbutrin  7. Essential hypertension: Continue Norvasc, isosorbide and metoprolol  8. Chronic Diastolic heart failure: No signs of exacerbation 9. Hypokalemia: Recheck and replete   10. Acute kidney injury: Hold nephrotoxic agents Continue gentle IV hydration Repeat BMP in a.m.  11. History of PE in April 2018: Off of and to coalition for now due to hematoma.   CODE STATUS: FULL  TOTAL TIME TAKING CARE OF THIS PATIENT: 28 minutes.     POSSIBLE D/C 2-4 days, DEPENDING ON CLINICAL CONDITION.   Sevrin Sally M.D on 09/22/2016 at 12:08 PM  Between 7am to 6pm - Pager - [phone] After 6pm go to [email or web address] - password [email or web address] [facility]  Office  [phone]  CC: Primary care physician; Volanda Napoleon, MD  Note: This dictation was prepared with Dragon dictation along with smaller phrase technology. Any transcriptional errors that result from this process  are unintentional.

## 2016-09-22 NOTE — Progress Notes (Signed)
Patient potassium 2.7 MD notified orders received to give 11meg of potassium once and then 1 hour later give 20 meq once

## 2016-09-22 NOTE — Progress Notes (Signed)
MEDICATION RELATED CONSULT NOTE - INITIAL   Pharmacy Consult for Electrolyte Monitoring/Replacement Indication: hypokalemia  Allergies  Allergen Reactions  . Cephalosporins Other (See Comments)    Patient doesn't recall the reaction  . Dilaudid [Hydromorphone Hcl] Other (See Comments)    "CANNOT HEAR, CANNOT THINK"    . Hydrocodone-Acetaminophen Nausea And Vomiting  . Morphine And Related Other (See Comments)    Patient was told by a doctor that this "would kill" him  . Tetracycline Other (See Comments)    Patient was told by a doctor that this "would kill" him  . Tetracyclines & Related Other (See Comments)    Patient doesn't recall the reaction  . Penicillins Rash    Has patient had a PCN reaction causing immediate rash, facial/tongue/throat swelling, SOB or lightheadedness with hypotension: Yes Has patient had a PCN reaction causing severe rash involving mucus membranes or skin necrosis: No Has patient had a PCN reaction that required hospitalization No Has patient had a PCN reaction occurring within the last 10 years: No If all of the above answers are "NO", then may proceed with Cephalosporin use.   . Sulfa Antibiotics Rash    Patient Measurements: Weight: (!) 367 lb (166.5 kg) Adjusted Body Weight:    Vital Signs: Temp: 98.8 F (37.1 C) (05/01 0743) Temp Source: Oral (05/01 0743) BP: 128/55 (05/01 0743) Pulse Rate: 81 (05/01 0743) Intake/Output from previous day: 04/30 0701 - 05/01 0700 In: 550 [IV Piggyback:550] Out: -  Intake/Output from this shift: No intake/output data recorded.  Labs:  Recent Labs  09/21/16 1919 09/22/16 0329  WBC 16.5* 12.3*  HGB 11.5* 10.5*  HCT 37.1* 33.8*  PLT 431 401  CREATININE 2.15* 1.90*  MG  --  2.4  ALBUMIN 3.0*  --   PROT 8.4*  --   AST 35  --   ALT 24  --   ALKPHOS 86  --   BILITOT 0.6  --   BILIDIR <0.1*  --   IBILI NOT CALCULATED  --    Lab Results  Component Value Date   K 2.7 (LL) 09/22/2016    Estimated Creatinine Clearance: 53.6 mL/min (A) (by C-G formula based on SCr of 1.9 mg/dL (H)).  Medical History: Past Medical History:  Diagnosis Date  . Arthritis   . Asthma   . CHF (congestive heart failure) ([place])   . Diabetes ([place])   . Heart trouble   . History of stomach ulcers   . Hypertension   . 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 OSA (obstructive sleep apnea) 08/23/2016  . Pulmonary embolism ([place]) 08/23/2016  . S/P CABG x 2   . Skin cancer   . Stroke [facility])     Medications:  Scheduled:  . amLODipine  10 mg Oral Daily  . atorvastatin  80 mg Oral QPM  . buPROPion  100 mg Oral Daily  . docusate sodium  100 mg Oral TID  . fluticasone  1 spray Each Nare Daily  . gabapentin  400 mg Oral TID  . hydrALAZINE  25 mg Oral TID  . insulin aspart  0-20 Units Subcutaneous TID WC  . insulin aspart  0-5 Units Subcutaneous QHS  . iron polysaccharides  150 mg Oral Daily  . isosorbide mononitrate  60 mg Oral Daily  . levothyroxine  100 mcg Oral QAC breakfast  . magnesium oxide  400 mg Oral Daily  . metoprolol  50 mg Oral BID  . mometasone-formoterol  2 puff Inhalation BID  . pantoprazole  40 mg Oral Daily  . senna-docusate  2 tablet Oral Daily  . sodium chloride flush  3 mL Intravenous Q12H  . Suvorexant  10 mg Oral QHS  . ziprasidone  80 mg Oral BID WC    Assessment: 71 yo M admitted with Cellulitits, knee hematoma.  Patient on lasix, hydrochlorothiazide, metolazone, torsemide, mag-ox PTA (per [facility])  Goal of Therapy:  Electrolytes WNL  Plan:  K 2.7,  Mag 2.4  Scr 1.90 (labs @ 0329) Patient received KCL PO 43meq @ 0637 and KCL PO 41meq @ 0507. Currently ordered Mag-Ox 400 daily  Will recheck Potassium level at 1200.   Cambell Stanek A 09/22/2016,9:43 AM

## 2016-09-22 NOTE — Progress Notes (Signed)
Patient complaining of back pain, orders received to give tylenol 650mg  every 6 hours PRN

## 2016-09-22 NOTE — Progress Notes (Addendum)
[name] ([id number]) Visit Report for 09/21/2016 Chief Complaint Document Details Patient Name: [name], [name]. Date of Service: 09/21/2016 3:30 PM Medical Record Number: [id number] Patient Account Number: [id number] Date of Birth/Sex: 12/02/1945 (71 y.o. Male) Treating RN: [name] Primary Care Provider: [name] [name] Other Clinician: Referring Provider: [name] [name] Treating Provider/Extender: [name] in Treatment: 5 Information Obtained from: Patient Chief Complaint Patient is at the clinic for treatment of an open pressure ulcer to the area of the right knee for about 3 weeks now Electronic Signature(s) Signed: 09/21/2016 4:26:31 PM By: [name] Entered By: [name] Fudge on 09/21/2016 16:26:31 Fritchman, [name] ([id number]) -------------------------------------------------------------------------------- HPI Details Patient Name: [name] Gathers T. Date of Service: 09/21/2016 3:30 PM Medical Record Number: [id number] Patient Account Number: [id number] Date of Birth/Sex: 1946/03/16 (71 y.o. Male) Treating RN: [name] Primary Care Provider: [name] [name] Other Clinician: Referring Provider: [name] [name] Treating Provider/Extender: [name] in Treatment: 5 History of Present Illness Location: right knee swelling with skin ulceration Quality: Patient reports experiencing a dull pain to affected area(s). Severity: Patient states wound are getting worse. Duration: Patient has had the wound for <4 weeks prior to presenting for treatment Timing: Pain in wound is Intermittent (comes and goes Context: The wound occurred when the patient had a cast placed over his right lower extremity and with the cast was removed he had a swelling of the knee with problems with his skin Modifying Factors: Other treatment(s) tried include:orthopedics Associated Signs and Symptoms: swelling of both lower extremities with large  amount of swelling of the knee HPI Description: 71 year old gentleman who has been living in an independent living facility for long-term care has had recurrent falls and recently had a large hematoma on the right knee. Known to be on aspirin and Plavix for history of CABG and was seen in the ER at the end of February after a fall. past medical history of arthritis, congestive heart failure, diabetes mellitus, hypertension, status post CABG o2, status post cholecystectomy, hernia repair and kidney surgery. recent workup for the fall showed negative clinical findings in his extremities and it was thought that he was falling due to deconditioning. a x-ray showed a right fibular neck fracture and was placed in a splint. he was asked to be seen by orthopedic surgeon. the patient cannot tell me which orthopedic doctor had seen him and I'm not able to get any notes from [facility], regarding his orthopedic care. The patient says twice during this last week he has had bloody fluid shooting out of his knee and has drained all over his boot and the flow. 08/21/2016 -- he was seen by the nurse practitioner at the facility and and distend she aspirated about 60 mL of hemorrhagic fluid from his right knee. 08/31/2016 -- since I saw the patient last he was admitted to the hospital at [facility] between 08/23/2016 2 08/27/2016 and was treated with a pulmonary embolism. he had EKOS complete, was started on Xarelto, and also treated for chronic diastolic congestive heart failure. he was asked to follow-up with the wound center and continue management locally. 09/07/2016 -- he has an appointment to see his orthopedic surgeon later this week. 09/14/2016 -- he did see the orthopedic service and they have asked him to continue with physical therapy. They discontinued the boot and the x-ray done showed a healed fracture. They said it was okay to use a wound VAC if needed. Addendum -- we got the note from the  Orthopedic  office where he was seen by [facility] orthopedics on [facility] in [place] and was seen by the PA [name]. -- The x-ray which was done was reviewed and there is good healing of the fracture and from the orthopedic point of view he has healed well and they have asked him to continue with good control of his diabetes and management by the wound center including using a wound VAC if required. [name] ([id number]) 09/21/2016 -- the patient's wound VAC had been applied 2 days ago, but the black sponge had not been placed laterally under the skin flap and into the area of the right of the knee, where there was a large cavity. On examination, after removal of his wound VAC, there is a large collection of seropurulent fluid and hematoma in the area starting at the lateral knee and going down to his right lateral compartment of the leg. Clinically, this is a huge infected abscess cavity Electronic Signature(s) Signed: 09/21/2016 4:30:22 PM By: [name] Entered By: [name] Fudge on 09/21/2016 16:30:21 [name], [name] ([id number]) -------------------------------------------------------------------------------- Physical Exam Details Patient Name: [name] Gathers T. Date of Service: 09/21/2016 3:30 PM Medical Record Number: [id number] Patient Account Number: [id number] Date of Birth/Sex: [birth year] (71 y.o. Male) Treating RN: [name] Primary Care Provider: [name] [name] Other Clinician: Referring Provider: [name] [name] Treating Provider/Extender: [name] in Treatment: 5 Constitutional . Pulse regular. Respirations normal and unlabored. Afebrile. . Eyes Nonicteric. Reactive to light. Ears, Nose, Mouth, and Throat Lips, teeth, and gums WNL.[name] Moist mucosa without lesions. Neck supple and nontender. No palpable supraclavicular or cervical adenopathy. Normal sized without goiter. Respiratory WNL. No retractions.. Breath sounds WNL, No  rubs, rales, rhonchi, or wheeze.. Cardiovascular Heart rhythm and rate regular, no murmur or gallop.. Pedal Pulses WNL. No clubbing, cyanosis or edema. Chest Breasts symmetical and no nipple discharge.. Breast tissue WNL, no masses, lumps, or tenderness.. Lymphatic No adneopathy. No adenopathy. No adenopathy. Musculoskeletal Adexa without tenderness or enlargement.. Digits and nails w/o clubbing, cyanosis, infection, petechiae, ischemia, or inflammatory conditions.. Integumentary (Hair, Skin) No suspicious lesions. No crepitus or fluctuance. No peri-wound warmth or erythema. No masses.[name] Psychiatric Judgement and insight Intact.. No evidence of depression, anxiety, or agitation.. Notes the large undermining at the 9:00 position was not appropriately dressed with a wound VAC black sponge and hence there is now a large collection of seropurulent fluid starting at the right lateral knee and going down into the right lateral compartment of his leg. This will need appropriate treatment in the OR with pulse lavage, incision and drainage and possible application of wound VAC. Electronic Signature(s) Signed: 09/21/2016 4:31:47 PM By: [name] Entered By: [name] Fudge on 09/21/2016 16:31:46 [name], [name] ([id number]) -------------------------------------------------------------------------------- Physician Orders Details Patient Name: [name] Gathers T. Date of Service: 09/21/2016 3:30 PM Medical Record Number: [id number] Patient Account Number: [id number] Date of Birth/Sex: 03-27-1946 (71 y.o. 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 Electronic Signature(s) Signed: 09/21/2016 4:45:53 PM By: [name] Signed: 09/21/2016 4:47:36 PM By: Alric Quan Entered By: Alric Quan on 09/21/2016 16:24:48 [name], [name] ([id number]) -------------------------------------------------------------------------------- Problem List Details Patient Name: [name], [name] Date of Service: 09/21/2016 3:30 PM Medical Record Number: [id number] Patient Account Number: [id number] Date of Birth/Sex: April 15, 1946 (71 y.o. 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, right knee 08/14/2016 Yes L97.812 Non-pressure chronic ulcer of other part of right lower leg 08/14/2016 Yes with fat layer exposed L89.892 Pressure ulcer of other site, stage 2 08/14/2016 Yes M70.861 Other soft tissue disorders related to use, overuse and 08/14/2016 Yes pressure, right lower leg M71.061 Abscess of bursa, right knee 09/21/2016 Yes Inactive Problems Resolved Problems Electronic Signature(s) Signed: 09/21/2016 4:26:19 PM By: [name] Previous Signature: 09/21/2016 4:12:38 PM Version By: [name] Entered By: [name] Fudge on 09/21/2016 16:26:19 [name], [name] ([id number]) -------------------------------------------------------------------------------- Progress Note Details Patient Name: [name] Gathers T. Date of Service: 09/21/2016 3:30 PM Medical Record Number: [id number] Patient Account Number: [id number] Date of Birth/Sex: Dec 26, 1945 (71 y.o. Male) Treating RN: [name] Primary Care Provider: [name] [name] Other Clinician: Referring Provider: [name] [name] Treating Provider/Extender: [name] in Treatment: 5 Subjective Chief Complaint Information obtained from Patient Patient is at the clinic for treatment of an open pressure ulcer to the area of the right knee for about 3 weeks now History of Present Illness (HPI) The following HPI elements were documented for the patient's wound: Location: right knee swelling with skin ulceration Quality: Patient reports experiencing a dull pain to affected area(s). Severity: Patient states wound are getting worse. Duration: Patient has had the wound for Timing: Pain in wound is Intermittent (comes and goes Context: The wound occurred when the patient had a cast placed over his right lower extremity and with the cast was removed he had a swelling of the knee with problems with his skin Modifying Factors: Other treatment(s) tried include:orthopedics Associated Signs and Symptoms: swelling of both lower extremities with large amount of swelling of the knee 71 year old gentleman who has been living in an independent living facility for long-term care has had recurrent falls and recently had a large hematoma on the right knee. Known to be on aspirin and Plavix for history of CABG and was seen in the ER at the end of February after a fall. past medical history of arthritis, congestive heart failure, diabetes mellitus, hypertension, status post CABG o2, status post cholecystectomy, hernia repair and  kidney surgery. recent workup for the fall showed negative clinical findings in his extremities and it was thought that he was falling due to deconditioning. a x-ray showed a right fibular neck fracture and was placed in a splint. he was asked to be seen by orthopedic surgeon. the patient cannot tell me which orthopedic doctor had seen him and I'm not able to get any notes from [place], regarding his orthopedic care. The patient says twice during this last week he has had bloody fluid shooting out of his knee and  has drained all over his boot and the flow. 08/21/2016 -- he was seen by the nurse practitioner at the facility and and distend she aspirated about 60 mL of hemorrhagic fluid from his right knee. 08/31/2016 -- since I saw the patient last he was admitted to the hospital at [facility] between 08/23/2016 2 08/27/2016 and was treated with a pulmonary embolism. he had EKOS complete, was started on Xarelto, and also treated for chronic diastolic congestive heart failure. he was asked to follow-up with the wound center and continue management locally. 09/07/2016 -- he has an appointment to see his orthopedic surgeon later this week. 09/14/2016 -- he did see the orthopedic service and they have asked him to continue with physical therapy. [name], [name] ([id number]) They discontinued the boot and the x-ray done showed a healed fracture. They said it was okay to use a wound VAC if needed. Addendum -- we got the note from the Orthopedic office where he was seen by [facility] orthopedics on [facility] in [place] and was seen by the PA [name]. -- The x-ray which was done was reviewed and there is good healing of the fracture and from the orthopedic point of view he has healed well and they have asked him to continue with good control of his diabetes and management by the wound center including using a wound VAC if required. 09/21/2016 -- the patient's wound VAC had been applied 2  days ago, but the black sponge had not been placed laterally under the skin flap and into the area of the right of the knee, where there was a large cavity. On examination, after removal of his wound VAC, there is a large collection of seropurulent fluid and hematoma in the area starting at the lateral knee and going down to his right lateral compartment of the leg. Clinically, this is a huge infected abscess cavity Objective Constitutional Pulse regular. Respirations normal and unlabored. Afebrile. Vitals Time Taken: 3:50 PM, Height: 67 in, Weight: 357 lbs, BMI: 55.9, Temperature: 97.4 F, Pulse: 75 bpm, Respiratory Rate: 16 breaths/min, Blood Pressure: 114/58 mmHg. Eyes Nonicteric. Reactive to light. Ears, Nose, Mouth, and Throat Lips, teeth, and gums WNL.[name] Moist mucosa without lesions. Neck supple and nontender. No palpable supraclavicular or cervical adenopathy. Normal sized without goiter. Respiratory WNL. No retractions.. Breath sounds WNL, No rubs, rales, rhonchi, or wheeze.. Cardiovascular Heart rhythm and rate regular, no murmur or gallop.. Pedal Pulses WNL. No clubbing, cyanosis or edema. Chest Breasts symmetical and no nipple discharge.. Breast tissue WNL, no masses, lumps, or tenderness.. Lymphatic No adneopathy. No adenopathy. No adenopathy. Musculoskeletal [name], [name] ([id number]) Adexa without tenderness or enlargement.. Digits and nails w/o clubbing, cyanosis, infection, petechiae, ischemia, or inflammatory conditions.[name] Psychiatric Judgement and insight Intact.. No evidence of depression, anxiety, or agitation.. General Notes: the large undermining at the 9:00 position was not appropriately dressed with a wound VAC black sponge and hence there is now a large collection of seropurulent fluid starting at the right lateral knee and going down into the right lateral compartment of his leg. This will need appropriate treatment in the OR with pulse lavage, incision  and drainage and possible application of wound VAC. Integumentary (Hair, Skin) No suspicious lesions. No crepitus or fluctuance. No peri-wound warmth or erythema. No masses.. Wound #1 status is Open. Original cause of wound was Pressure Injury. The wound is located on the Right,Anterior Knee. The wound measures 1.4cm length x 4.6cm width x 0.5cm depth; 5.058cm^2 area and  2.529cm^3 volume. There is Fat Layer (Subcutaneous Tissue) Exposed exposed. There is undermining starting at 11:00 and ending at 7:00 with a maximum distance of 7.8cm. There is a large amount of purulent drainage noted. The wound margin is flat and intact. There is large (67-100%) granulation within the wound bed. There is a small (1-33%) amount of necrotic tissue within the wound bed including Adherent Slough. The periwound skin appearance exhibited: Erythema. The periwound skin appearance did not exhibit: Callus, Crepitus, Excoriation, Induration, Rash, Scarring, Dry/Scaly, Maceration, Atrophie Blanche, Cyanosis, Ecchymosis, Hemosiderin Staining, Mottled, Pallor, Rubor. The surrounding wound skin color is noted with erythema which is circumferential. Periwound temperature was noted as No Abnormality. The periwound has tenderness on palpation. 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 [name], [name] ([id number]) This is beyond  the scope of outpatient treatment and he will need to go to the OR for a proper evacuation of this collection with possible placement of a wound VAC. Appropriate supportive care, including IV antibiotics and orthopedic intervention has been recommended and the patient is agreeable I have discussed the case with the charge nurse at the [facility]. Details of the case have been discussed and I will also fax my note to [phone]. Plan Secondary Dressing: Wound #1 Right,Anterior Knee: ABD pad Dry Gauze Conform/Kerlix Additional Orders / Instructions: Wound #1 Right,Anterior Knee: Other: - Pt to be sent to ER via EMS non emergent. The patient is noted to have a large abscess starting at the right lateral knee joint and going down to the right lateral compartment with significant fluctuation and extensive amount of clots, seropurulent fluid and a ballotable swelling. This is beyond the scope of outpatient treatment and he will need to go to the OR for a proper evacuation of this collection with possible placement of a wound VAC. Appropriate supportive care, including IV antibiotics and orthopedic intervention has been recommended and the patient is agreeable I have discussed the case with the charge nurse at the [facility]. Details of the case have been discussed and I will also fax my note to [phone]. Electronic Signature(s) Signed: 09/21/2016 4:36:07 PM By: [name] Entered By: [name] Fudge on 09/21/2016 16:36:07 [name], [name] ([id number]) -------------------------------------------------------------------------------- SuperBill Details Patient Name: [name] Gathers T. Date of Service: 09/21/2016 Medical Record Number: [id number] Patient Account Number: [id number] Date of Birth/Sex: 01/05/1946 (71 y.o. 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 L89.892 Pressure ulcer of other site, stage 2 M70.861 Other soft tissue disorders related to use, overuse and pressure, right lower leg M71.061 Abscess of bursa,  right knee Facility Procedures CPT4 Code: [id number] Description: 85909 - WOUND CARE VISIT-LEV 4 EST PT Modifier: Quantity: 1 Physician Procedures CPT4: Description Modifier Quantity Code [id number] 44695 - WC PHYS LEVEL 4 - EST PT 1 ICD-10 Description Diagnosis 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

## 2016-09-22 NOTE — NC FL2 (Signed)
Industry LEVEL OF CARE SCREENING TOOL     IDENTIFICATION  Patient Name: [name] Birthdate: Jul 07, 1945 Sex: male Admission Date (Current Location): 09/21/2016  [place] and [place] Number:  Engineering geologist and Address:  [facility], [address] [place], [place] [address]      Provider Number: [id number]  Attending Physician Name and Address:  Bettey Costa, MD  Relative Name and Phone Number:       Current Level of Care: Hospital Recommended Level of Care: [place] Prior Approval Number:    Date Approved/Denied:   PASRR Number:  ([id number] )  Discharge Plan: SNF    Current Diagnoses: Patient Active Problem List   Diagnosis Date Noted  . Cellulitis 09/21/2016  . AKI (acute kidney injury) ([place]) 08/27/2016  . Demand ischemia ([employer]) 08/27/2016  . Elevated troponin 08/27/2016  . 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 DVT (deep venous thrombosis) ([place]) 08/27/2016  . Pressure injury of skin 08/24/2016  . Pulmonary embolism ([place]) 08/23/2016  . SIRS (systemic inflammatory response syndrome) ([place]) 08/23/2016  . Acute on chronic renal failure ([place]) 08/23/2016  . Acute on chronic respiratory failure ([place]) 08/23/2016  . Lactic acidosis 08/23/2016  . Electrolyte imbalance 08/23/2016  . Current use of proton pump inhibitor 08/23/2016  . OSA (obstructive sleep apnea) 08/23/2016  . History of bipolar disorder 08/23/2016  . History of gastric ulcer 08/23/2016  . History of asthma 08/23/2016  . History of chronic pain 08/23/2016  . Acute pulmonary embolism ([place]) 08/23/2016  . Chest pain 07/14/2016  . UTI (urinary tract infection) 04/27/2016  . CAD (coronary artery disease) 04/27/2016  . Diabetes ([place]) 04/27/2016  . HTN (hypertension) 04/27/2016  . Chronic diastolic CHF (congestive heart failure) ([place]) 04/27/2016  . CKD (chronic kidney disease), stage III 04/27/2016  . Chest pain, rule out acute myocardial infarction  02/16/2016    Orientation RESPIRATION BLADDER Height & Weight     Self, Time, Situation, Place  Normal Continent Weight: (!) 367 lb (166.5 kg) Height:     BEHAVIORAL SYMPTOMS/MOOD NEUROLOGICAL BOWEL NUTRITION STATUS   (none)  (none) Continent Diet (Diet: Heart Healthy/ Carb Modified )  AMBULATORY STATUS COMMUNICATION OF NEEDS Skin   Extensive Assist Verbally PU Stage and Appropriate Care (pressure ulcer stage 1 on right leg )                       Personal Care Assistance Level of Assistance  Bathing, Feeding, Dressing Bathing Assistance: Limited assistance Feeding assistance: Independent Dressing Assistance: Limited assistance     Functional Limitations Info  Sight, Hearing, Speech Sight Info: Adequate Hearing Info: Adequate Speech Info: Adequate    SPECIAL CARE FACTORS FREQUENCY                       Contractures      Additional Factors Info  Code Status, Allergies, Isolation Precautions Code Status Info:  (Full Code. ) Allergies Info:  (Cephalosporins, Dilaudid Hydromorphone Hcl, Hydrocodone-acetaminophen, Morphine And Related, Tetracycline, Tetracyclines & Related, Penicillins, Sulfa Antibiotics)     Isolation Precautions Info:  (MRSA )     Current Medications (09/22/2016):  This is the current hospital active medication list Current Facility-Administered Medications  Medication Dose Route Frequency Provider Last Rate Last Dose  . 0.9 %  sodium chloride infusion  250 mL Intravenous PRN Pavan Pyreddy, MD      . 0.9 %  sodium chloride infusion   Intravenous  Continuous Saundra Shelling, MD 75 mL/hr at 09/22/16 0105    . acetaminophen (TYLENOL) tablet 650 mg  650 mg Oral Q6H PRN Saundra Shelling, MD       Or  . acetaminophen (TYLENOL) suppository 650 mg  650 mg Rectal Q6H PRN Pavan Pyreddy, MD      . albuterol (PROVENTIL) (2.5 MG/3ML) 0.083% nebulizer solution 3 mL  3 mL Inhalation Q4H PRN Pavan Pyreddy, MD      . amLODipine (NORVASC) tablet 10 mg  10 mg Oral  Daily Saundra Shelling, MD   10 mg at 09/22/16 0919  . atorvastatin (LIPITOR) tablet 80 mg  80 mg Oral QPM Pavan Pyreddy, MD      . buPROPion (WELLBUTRIN SR) 12 hr tablet 100 mg  100 mg Oral Daily Saundra Shelling, MD   100 mg at 09/22/16 0923  . docusate sodium (COLACE) capsule 100 mg  100 mg Oral TID Saundra Shelling, MD   100 mg at 09/22/16 0923  . fluticasone (FLONASE) 50 MCG/ACT nasal spray 1 spray  1 spray Each Nare Daily Saundra Shelling, MD   1 spray at 09/22/16 0922  . gabapentin (NEURONTIN) capsule 400 mg  400 mg Oral TID Saundra Shelling, MD   400 mg at 09/22/16 0923  . hydrALAZINE (APRESOLINE) tablet 25 mg  25 mg Oral TID Saundra Shelling, MD   25 mg at 09/22/16 0923  . insulin aspart (novoLOG) injection 0-20 Units  0-20 Units Subcutaneous TID WC Saundra Shelling, MD   3 Units at 09/22/16 0921  . insulin aspart (novoLOG) injection 0-5 Units  0-5 Units Subcutaneous QHS Saundra Shelling, MD   2 Units at 09/22/16 0103  . ipratropium-albuterol (DUONEB) 0.5-2.5 (3) MG/3ML nebulizer solution 3 mL  3 mL Inhalation Q4H PRN Pavan Pyreddy, MD      . iron polysaccharides (NIFEREX) capsule 150 mg  150 mg Oral Daily Saundra Shelling, MD   150 mg at 09/22/16 0923  . isosorbide mononitrate (IMDUR) 24 hr tablet 60 mg  60 mg Oral Daily Saundra Shelling, MD   60 mg at 09/22/16 0919  . [START ON 09/23/2016] levofloxacin (LEVAQUIN) IVPB 750 mg  750 mg Intravenous Q24H Pavan Pyreddy, MD      . levothyroxine (SYNTHROID, LEVOTHROID) tablet 100 mcg  100 mcg Oral QAC breakfast Saundra Shelling, MD   100 mcg at 09/22/16 0924  . magnesium oxide (MAG-OX) tablet 400 mg  400 mg Oral Daily Saundra Shelling, MD   400 mg at 09/22/16 0920  . meclizine (ANTIVERT) tablet 25 mg  25 mg Oral BID PRN Saundra Shelling, MD      . metoprolol (LOPRESSOR) tablet 50 mg  50 mg Oral BID Saundra Shelling, MD   50 mg at 09/22/16 0920  . mometasone-formoterol (DULERA) 100-5 MCG/ACT inhaler 2 puff  2 puff Inhalation BID Saundra Shelling, MD   2 puff at 09/22/16 0924  . oxyCODONE (Oxy  IR/ROXICODONE) immediate release tablet 5 mg  5 mg Oral Q4H PRN Saundra Shelling, MD   5 mg at 09/22/16 0327  . pantoprazole (PROTONIX) EC tablet 40 mg  40 mg Oral Daily Saundra Shelling, MD   40 mg at 09/22/16 0920  . senna-docusate (Senokot-S) tablet 2 tablet  2 tablet Oral Daily Saundra Shelling, MD   2 tablet at 09/22/16 0919  . sodium chloride flush (NS) 0.9 % injection 3 mL  3 mL Intravenous Q12H Saundra Shelling, MD   3 mL at 09/22/16 7616  . sodium chloride flush (NS) 0.9 % injection 3  mL  3 mL Intravenous PRN Saundra Shelling, MD      . Suvorexant TABS 10 mg  10 mg Oral QHS Pavan Pyreddy, MD      . vancomycin (VANCOCIN) 1,500 mg in sodium chloride 0.9 % 500 mL IVPB  1,500 mg Intravenous Q18H Pavan Pyreddy, MD      . ziprasidone (GEODON) capsule 80 mg  80 mg Oral BID WC Saundra Shelling, MD   80 mg at 09/22/16 9038     Discharge Medications: Please see discharge summary for a list of discharge medications.  Relevant Imaging Results:  Relevant Lab Results:   Additional Information  (SSN: [id number])  Felisa Zechman, Veronia Beets, LCSW

## 2016-09-22 NOTE — Plan of Care (Signed)
Problem: Education: Goal: Verbalization of understanding the information provided will improve Outcome: Progressing Patient verbalized that legs feels much better and that it doesn't feel as "tight".  Patient leg still red, cool, and bilateral plus 2 edema still present.  [name], RN

## 2016-09-23 DIAGNOSIS — R609 Edema, unspecified: Secondary | ICD-10-CM

## 2016-09-23 LAB — CBC
HCT: 32.8 % — ABNORMAL LOW (ref 40.0–52.0)
Hemoglobin: 10.3 g/dL — ABNORMAL LOW (ref 13.0–18.0)
MCH: 24.9 pg — ABNORMAL LOW (ref 26.0–34.0)
MCHC: 31.4 g/dL — AB (ref 32.0–36.0)
MCV: 79.3 fL — AB (ref 80.0–100.0)
PLATELETS: 375 10*3/uL (ref 150–440)
RBC: 4.13 MIL/uL — ABNORMAL LOW (ref 4.40–5.90)
RDW: 17.6 % — AB (ref 11.5–14.5)
WBC: 7.3 10*3/uL (ref 3.8–10.6)

## 2016-09-23 LAB — GLUCOSE, CAPILLARY
GLUCOSE-CAPILLARY: 183 mg/dL — AB (ref 65–99)
Glucose-Capillary: 148 mg/dL — ABNORMAL HIGH (ref 65–99)
Glucose-Capillary: 226 mg/dL — ABNORMAL HIGH (ref 65–99)

## 2016-09-23 LAB — BASIC METABOLIC PANEL
ANION GAP: 8 (ref 5–15)
BUN: 39 mg/dL — AB (ref 6–20)
CALCIUM: 8.8 mg/dL — AB (ref 8.9–10.3)
CO2: 29 mmol/L (ref 22–32)
CREATININE: 1.19 mg/dL (ref 0.61–1.24)
Chloride: 98 mmol/L — ABNORMAL LOW (ref 101–111)
GFR calc Af Amer: >60 mL/min (ref >60)
GFR, EST NON AFRICAN AMERICAN: 60 mL/min — AB (ref >60)
GLUCOSE: 153 mg/dL — AB (ref 65–99)
Potassium: 3.4 mmol/L — ABNORMAL LOW (ref 3.5–5.1)
Sodium: 135 mmol/L (ref 135–145)

## 2016-09-23 MED ORDER — POLYVINYL ALCOHOL 1.4 % OP SOLN
2.0000 [drp] | OPHTHALMIC | Status: DC | PRN
  Filled 2016-09-23: qty 15

## 2016-09-23 MED ORDER — LEVOFLOXACIN 750 MG PO TABS: 750.0000 mg | ORAL_TABLET | Freq: Every day | ORAL | 0 refills | Status: DC

## 2016-09-23 MED ORDER — RIVAROXABAN 20 MG PO TABS: 20.0000 mg | ORAL_TABLET | Freq: Every day | ORAL | Status: DC

## 2016-09-23 MED ORDER — LEVOFLOXACIN 750 MG PO TABS: 750.0000 mg | ORAL_TABLET | Freq: Every day | ORAL | 0 refills | Status: AC

## 2016-09-23 MED ORDER — POTASSIUM CHLORIDE CRYS ER 20 MEQ PO TBCR
40.0000 meq | EXTENDED_RELEASE_TABLET | Freq: Once | ORAL | Status: AC
  Administered 2016-09-23: 40 meq via ORAL
  Filled 2016-09-23: qty 2

## 2016-09-23 MED ORDER — RIVAROXABAN 20 MG PO TABS: 20.0000 mg | ORAL_TABLET | Freq: Every day | ORAL | 0 refills | Status: AC

## 2016-09-23 NOTE — Progress Notes (Signed)
Pharmacy note:  Patient started Xarelto 15 mg po BID at [facility] on 08/26/16 for Pulmonary embolism. Dosing for PE treatment is Xarelto (rivaroxaban) 15mg  po bidwm x 21 days then 20 mg daily w/supper.  Patient has completed 21 days of 15 mg po bid. Per discussion with Dr. Benjie Karvonen, will now transition patient to Xarelto 20mg  po Daily with supper.  Chinita Greenland PharmD Clinical Pharmacist 09/23/2016

## 2016-09-23 NOTE — Plan of Care (Signed)
Problem: Activity: Goal: Risk for activity intolerance will decrease Outcome: Progressing Patient up to chair with 2 assist. Patient feels comfortable. No pain at this time. Minimal drainage from wound vac.  [name], RN

## 2016-09-23 NOTE — Progress Notes (Signed)
RN spoke with [name] at [facility] and gave her report. All questions answered. Patient wound  vac intact with minimal drainage. Patient alert and oriented. Complaining of moderate pain in right leg. RN will medicate. EMS will be called for pick up.   [name], RN

## 2016-09-23 NOTE — Progress Notes (Signed)
Patient BP 90/47. MD notified. MD ordered for RN to hold all blood pressure medications. No new orders at this time.  [name], RN

## 2016-09-23 NOTE — Progress Notes (Signed)
Clinical Social Worker (CSW) contacted patient's daughter [name] to discuss D/C plan. [name] voiced concerns about [facility]. Per chart on patient's last admission [facility] was the only bed offer. CSW made daughter aware that per MD patient is stable for D/C today and [facility] has a wound vac for him. CSW also explained that barriers to patient's limited SNF bed offers is his weight. Daughter verbalized her understanding. CSW provided emotional support and discussed long term care options. Per daughter she will meet with the director or nursing at [facility] and has the ombudsman information. Daughter is agreeable for patient to return to [facility] today.   Patient is medically stable for D/C to [facility] today. CSW contacted [facility] admissions coordinator at [facility] and made him aware of daughters concerns. Per Marden Noble patient can return today to room 29-B. RN will call report and arrange EMS for transport. Per Marden Noble they have a wound vac for patient at facility. CSW sent D/C orders to [facility] via [facility]. Patient is aware of above. Patient's daughter [name] is aware of above. Please reconsult if future social work needs arise. CSW signing off.   [employer], LCSW [phone]

## 2016-09-23 NOTE — Progress Notes (Signed)
EMS here for pickup. Patient wound vac clamped off.   [name], RN

## 2016-09-23 NOTE — Evaluation (Signed)
Physical Therapy Evaluation Patient Details Name: [name] MRN: [id number] DOB: 1946-03-25 Today's Date: 09/23/2016   History of Present Illness  [name]  is a 71 y.o. male with a known history of congestive heart failure, diabetes mellitus, hypertension, sleep apnea, and pulmonary embolism on Xarelto for anticoagulation. Pt has a right knee wound and wound VAC. Today he went to wound care center clinic and was sent over to [facility]. He has a 7.5/8 cm horizontal wound below right patella area. And he also has a lot of swelling around the right knee and the upper part of the right shin. No history of any fever and chills. Has pain in the right lower extremity. Compliant with his anti-coagulation medication. Patient was evaluated in the emergency room he was found to have right knee cellulitis he was started on antibiotics. Hospitalist service was consulted for further care of the patient. Pt is now admitted for R knee cellulitis, AKI, hyponatremia, and hypokalemia.   Clinical Impression  Pt admitted with above diagnosis. Pt currently with functional limitations due to the deficits listed below (see PT Problem List). Pt is very weak and requires considerable assist for bed mobility, transfers, and ambulation. He is only able to ambulate a short distance from bed to recliner with heavy UE reliance on rolling walker. His respiration rate increases and his HR increases to 120bpm. SaO2 drops to 90% on room air and pt is visibly SOB and fatigued. Unable to ambulate farther at this time. Pt will need to return to SNF after discharge to continue his rehab. Pt will benefit from skilled PT services to address deficits in strength, balance, and mobility in order to return to full function at home.      Follow Up Recommendations SNF    Equipment Recommendations  None recommended by PT    Recommendations for Other Services       Precautions / Restrictions Precautions Precautions:  Fall Restrictions Weight Bearing Restrictions: No      Mobility  Bed Mobility Overal bed mobility: Needs Assistance Bed Mobility: Supine to Sit     Supine to sit: Min assist     General bed mobility comments: Pt requires verbal cues for rolling and minA+1 to go from R sidelying to sitting  Transfers Overall transfer level: Needs assistance Equipment used: Rolling walker (2 wheeled) (Bariatric) Transfers: Sit to/from Stand Sit to Stand: Mod assist;+2 physical assistance         General transfer comment: Pt requiring modA+2 for sit to stand transfers. Cues for hand placement and heavy UE reliance on rolling walker once in standing  Ambulation/Gait Ambulation/Gait assistance: Mod assist Ambulation Distance (Feet): 3 Feet Assistive device: Rolling walker (2 wheeled) (Bariatric) Gait Pattern/deviations: Decreased step length - right;Decreased step length - left Gait velocity: Decreased Gait velocity interpretation: <1.8 ft/sec, indicative of risk for recurrent falls General Gait Details: Pt takes small short steps from bed to recliner with heavy UE reliance on rolling walker. His respiration rate increases and his HR increases to 120bpm. SaO2 drops to 90% on room air and pt is visibly SOB and fatigued. Unable to ambulate farther at this time.  Stairs            Wheelchair Mobility    Modified Rankin (Stroke Patients Only)       Balance Overall balance assessment: Needs assistance Sitting-balance support: No upper extremity supported Sitting balance-Leahy Scale: Good     Standing balance support: No upper extremity supported Standing balance-Leahy Scale: Fair  Pertinent Vitals/Pain Pain Assessment: 0-10 Pain Score: 5  Pain Location: L hand IV site Pain Intervention(s): Monitored during session    Home Living Family/patient expects to be discharged to:: Skilled nursing facility                 Additional  Comments: Pt reports he is at [facility] for SNF currently.    Prior Function Level of Independence: Needs assistance   Gait / Transfers Assistance Needed: Was walking with RW with PT at SNF, short distances with chair follow  ADL's / Homemaking Assistance Needed: Assist from SNF staff for dressing/bathing at bed level or sitting on BSC. BSC for toileting.        Hand Dominance   Dominant Hand: Right    Extremity/Trunk Assessment   Upper Extremity Assessment Upper Extremity Assessment: Generalized weakness    Lower Extremity Assessment Lower Extremity Assessment: Generalized weakness       Communication   Communication: No difficulties  Cognition Arousal/Alertness: Awake/alert Behavior During Therapy: Flat affect Overall Cognitive Status: No family/caregiver present to determine baseline cognitive functioning                                 General Comments: Pt is AOx3, struggles to recall situation and is poor historian with respect to wound on RLE      General Comments      Exercises     Assessment/Plan    PT Assessment Patient needs continued PT services  PT Problem List Decreased strength;Decreased activity tolerance;Decreased balance;Decreased mobility;Decreased safety awareness;Obesity       PT Treatment Interventions DME instruction;Gait training;Functional mobility training;Therapeutic activities;Therapeutic exercise;Balance training;Neuromuscular re-education;Patient/family education    PT Goals (Current goals can be found in the Care Plan section)  Acute Rehab PT Goals Patient Stated Goal: Return to prior function PT Goal Formulation: With patient Time For Goal Achievement: 10/07/16 Potential to Achieve Goals: Fair    Frequency Min 2X/week   Barriers to discharge        Co-evaluation               AM-PAC PT "6 Clicks" Daily Activity  Outcome Measure Difficulty turning over in bed (including adjusting  bedclothes, sheets and blankets)?: Total Difficulty moving from lying on back to sitting on the side of the bed? : Total Difficulty sitting down on and standing up from a chair with arms (e.g., wheelchair, bedside commode, etc,.)?: Total Help needed moving to and from a bed to chair (including a wheelchair)?: A Lot Help needed walking in hospital room?: A Lot Help needed climbing 3-5 steps with a railing? : Total 6 Click Score: 8    End of Session Equipment Utilized During Treatment: Gait belt Activity Tolerance: Patient limited by fatigue Patient left: in chair;with call bell/phone within reach;with chair alarm set;with nursing/sitter in room;Other (comment) (Wound vac connected) Nurse Communication: Mobility status;Other (comment) (RN educated about +2, with walker for pivot) PT Visit Diagnosis: Unsteadiness on feet (R26.81);Muscle weakness (generalized) (M62.81);Other abnormalities of gait and mobility (R26.89)    Time: 7628-3151 PT Time Calculation (min) (ACUTE ONLY): 28 min   Charges:   PT Evaluation $PT Eval Moderate Complexity: 1 Procedure     PT G Codes:   PT G-Codes **NOT FOR INPATIENT CLASS** Functional Assessment Tool Used: AM-PAC 6 Clicks Basic Mobility Functional Limitation: Mobility: Walking and moving around Mobility: Walking and Moving Around Current Status (V6160): At least 80 percent but  less than 100 percent impaired, limited or restricted Mobility: Walking and Moving Around Goal Status [phone]): At least 60 percent but less than 80 percent impaired, limited or restricted    Phillips Grout PT, DPT    Hatem Cull 09/23/2016, 9:26 AM

## 2016-09-23 NOTE — Progress Notes (Signed)
MEDICATION RELATED CONSULT NOTE- follow up  Pharmacy Consult for Electrolyte Monitoring/Replacement Indication: hypokalemia  Allergies  Allergen Reactions  . Cephalosporins Other (See Comments)    Patient doesn't recall the reaction  . Dilaudid [Hydromorphone Hcl] Other (See Comments)    "CANNOT HEAR, CANNOT THINK"    . Hydrocodone-Acetaminophen Nausea And Vomiting  . Morphine And Related Other (See Comments)    Patient was told by a doctor that this "would kill" him  . Tetracycline Other (See Comments)    Patient was told by a doctor that this "would kill" him  . Tetracyclines & Related Other (See Comments)    Patient doesn't recall the reaction  . Penicillins Rash    Has patient had a PCN reaction causing immediate rash, facial/tongue/throat swelling, SOB or lightheadedness with hypotension: Yes Has patient had a PCN reaction causing severe rash involving mucus membranes or skin necrosis: No Has patient had a PCN reaction that required hospitalization No Has patient had a PCN reaction occurring within the last 10 years: No If all of the above answers are "NO", then may proceed with Cephalosporin use.   . Sulfa Antibiotics Rash    Patient Measurements: Height: 5\' 6"  (167.6 cm) (patient stated but unsure) Weight: (!) 367 lb (166.5 kg) IBW/kg (Calculated) : 63.8 Adjusted Body Weight:    Vital Signs: Temp: 97.8 F (36.6 C) (05/02 0745) Temp Source: Oral (05/02 0745) BP: 103/38 (05/02 0848) Pulse Rate: 81 (05/02 0848) Intake/Output from previous day: 05/01 0701 - 05/02 0700 In: 1858.8 [P.O.:240; I.V.:1118.8; IV Piggyback:500] Out: -  Intake/Output from this shift: Total I/O In: 1671.3 [I.V.:1171.3; IV Piggyback:500] Out: -   Labs:  Recent Labs  09/21/16 1919 09/22/16 0329 09/22/16 1225 09/23/16 0324  WBC 16.5* 12.3*  --  7.3  HGB 11.5* 10.5*  --  10.3*  HCT 37.1* 33.8*  --  32.8*  PLT 431 401  --  375  CREATININE 2.15* 1.90* 1.27* 1.19  MG  --  2.4  --    --   ALBUMIN 3.0*  --   --   --   PROT 8.4*  --   --   --   AST 35  --   --   --   ALT 24  --   --   --   ALKPHOS 86  --   --   --   BILITOT 0.6  --   --   --   BILIDIR <0.1*  --   --   --   IBILI NOT CALCULATED  --   --   --    Lab Results  Component Value Date   K 3.4 (L) 09/23/2016   Estimated Creatinine Clearance: 84.5 mL/min (by C-G formula based on SCr of 1.19 mg/dL).  Medical History: Past Medical History:  Diagnosis Date  . Arthritis   . Asthma   . CHF (congestive heart failure) ([place])   . Diabetes ([place])   . Heart trouble   . History of stomach ulcers   . Hypertension   . 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 OSA (obstructive sleep apnea) 08/23/2016  . Pulmonary embolism ([place]) 08/23/2016  . S/P CABG x 2   . Skin cancer   . Stroke [facility])     Medications:  Scheduled:  . amLODipine  10 mg Oral Daily  . atorvastatin  80 mg Oral QPM  . buPROPion  100 mg Oral Daily  . docusate sodium  100 mg Oral TID  . fluticasone  1 spray  Each Nare Daily  . gabapentin  400 mg Oral TID  . hydrALAZINE  25 mg Oral TID  . insulin aspart  0-20 Units Subcutaneous TID WC  . insulin aspart  0-5 Units Subcutaneous QHS  . iron polysaccharides  150 mg Oral Daily  . isosorbide mononitrate  60 mg Oral Daily  . levothyroxine  100 mcg Oral QAC breakfast  . magnesium oxide  400 mg Oral Daily  . metoprolol  50 mg Oral BID  . mometasone-formoterol  2 puff Inhalation BID  . pantoprazole  40 mg Oral Daily  . rivaroxaban  20 mg Oral Q supper  . senna-docusate  2 tablet Oral Daily  . sodium chloride flush  3 mL Intravenous Q12H  . ziprasidone  80 mg Oral BID WC    Assessment: 71 yo M admitted with Cellulitits, knee hematoma.  Patient on lasix, hydrochlorothiazide, metolazone, torsemide, mag-ox PTA (per [facility])  Goal of Therapy:  Electrolytes WNL  Plan:  K= 3.4.     Scr 1.19    (5/1 Mag= 2.4) Patient was ordered KCL 40 meq PO x 1 per MD. Patient on Mag-OX 400 PO daily Diuretics are on  hold. F/u electrolytes in am.   Hughey Rittenberry A 09/23/2016,9:29 AM

## 2016-09-23 NOTE — Progress Notes (Signed)
Blood pressure now 142/80. Patient c/o dry eyes, standing order carried out for natural tear. And PRN oxycodone for c/o pain 7/10. Will continue to monitor and endorse.

## 2016-09-23 NOTE — Discharge Summary (Addendum)
[facility] NAME: [name]    MR#:  [id number]  DATE OF BIRTH:  December 17, 1945  DATE OF ADMISSION:  09/21/2016 ADMITTING PHYSICIAN: Saundra Shelling, MD  DATE OF DISCHARGE: 09/23/2016  PRIMARY CARE PHYSICIAN: Volanda Napoleon, MD    ADMISSION DIAGNOSIS:  Swelling [R60.9]  DISCHARGE DIAGNOSIS:  Active Problems:   Cellulitis   SECONDARY DIAGNOSIS:   Past Medical History:  Diagnosis Date  . Arthritis   . Asthma   . CHF (congestive heart failure) ([place])   . Diabetes ([place])   . Heart trouble   . History of stomach ulcers   . Hypertension   . 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 OSA (obstructive sleep apnea) 08/23/2016  . Pulmonary embolism ([place]) 08/23/2016  . S/P CABG x 2   . Skin cancer   . Stroke [facility])     HOSPITAL COURSE:    71 year old male with morbid obesity, diabetes, CAD and hypertension who presented with a large and painful partially drained infected and loculated hematoma.   71 year old morbidly obese man with OSA who was sent by wound care physician for right knee hematoma.  1. Right knee hematoma/cellulitis: Patient had surgical evaluation and hematoma was drained at bedside by surgical services. Patient will continue on Levaquin. Patient has a wound VAC and will need follow-up with Dr. Con Memos at wound clinic. Patient is well-known to him. Hemoglobin remained stable As per wound consult, Wound care:Cleanse right knee with NS and pat gently dry. Apply Aquacel Ag to right knee. Change on MOnday and Thursday and PRN soilage.   2. OSA: Continue CPAP at night  3. Diabetes: Continue ADA diet and outpatient regimen   4. Chronic anemia on iron 5. Hyperlipidemia: Continue statin  6. Depression: Continue Wellbutrin  7. Essential hypertension: At times patient's blood pressure was low. He makes adjustments to blood pressure medications. He needs to have his blood pressure monitored at least twice a day.  8.  Chronic Diastolic heart failure: No signs of exacerbation 9. Hypokalemia: This was repleted.   10. Acute kidney injury: creatinine improved with IV fluids 11. History of PE in April 2018: patient will continue on Xarelto  DISCHARGE CONDITIONS AND DIET:   Stable Heart healthy diabetic diet  CONSULTS OBTAINED:  Treatment Team:  Earnestine Leys, MD Vickie Epley, MD  DRUG ALLERGIES:   Allergies  Allergen Reactions  . Cephalosporins Other (See Comments)    Patient doesn't recall the reaction  . Dilaudid [Hydromorphone Hcl] Other (See Comments)    "CANNOT HEAR, CANNOT THINK"    . Hydrocodone-Acetaminophen Nausea And Vomiting  . Morphine And Related Other (See Comments)    Patient was told by a doctor that this "would kill" him  . Tetracycline Other (See Comments)    Patient was told by a doctor that this "would kill" him  . Tetracyclines & Related Other (See Comments)    Patient doesn't recall the reaction  . Penicillins Rash    Has patient had a PCN reaction causing immediate rash, facial/tongue/throat swelling, SOB or lightheadedness with hypotension: Yes Has patient had a PCN reaction causing severe rash involving mucus membranes or skin necrosis: No Has patient had a PCN reaction that required hospitalization No Has patient had a PCN reaction occurring within the last 10 years: No If all of the above answers are "NO", then may proceed with Cephalosporin use.   . Sulfa Antibiotics Rash    DISCHARGE MEDICATIONS:   Current Discharge Medication  List    START taking these medications   Details  levofloxacin (LEVAQUIN) 750 MG tablet Take 1 tablet (750 mg total) by mouth daily. Qty: 4 tablet, Refills: 0      CONTINUE these medications which have CHANGED   Details  rivaroxaban (XARELTO) 20 MG TABS tablet Take 1 tablet (20 mg total) by mouth daily with supper. Qty: 30 tablet, Refills: 0      CONTINUE these medications which have NOT CHANGED   Details  albuterol  (PROAIR HFA) 108 (90 Base) MCG/ACT inhaler Inhale 2 puffs into the lungs every 4 (four) hours as needed for wheezing or shortness of breath.    amLODipine (NORVASC) 10 MG tablet Take 10 mg by mouth daily.     atorvastatin (LIPITOR) 80 MG tablet Take 80 mg by mouth every evening.    buPROPion (WELLBUTRIN SR) 100 MG 12 hr tablet Take 100 mg by mouth daily.     celecoxib (CELEBREX) 200 MG capsule Take 200 mg by mouth 2 (two) times daily.     cetirizine (ZYRTEC) 10 MG tablet Take 10 mg by mouth daily.    Cholecalciferol (VITAMIN D3) 50000 units CAPS Take 50,000 Units by mouth every Thursday.     collagenase (SANTYL) ointment Apply 1 application topically See admin instructions. Apply to wound bed (nickel thick) daily for wound care    docusate sodium (COLACE) 100 MG capsule Take 100 mg by mouth 3 (three) times daily.    fluticasone (FLONASE) 50 MCG/ACT nasal spray Place 1 spray into both nostrils daily.    Fluticasone-Salmeterol (ADVAIR) 100-50 MCG/DOSE AEPB Inhale 1 puff into the lungs 2 (two) times daily.    furosemide (LASIX) 40 MG tablet Take 40 mg by mouth 2 (two) times daily.    gabapentin (NEURONTIN) 400 MG capsule Take 400 mg by mouth 3 (three) times daily.     hydrALAZINE (APRESOLINE) 25 MG tablet Take 25 mg by mouth 3 (three) times daily.     ipratropium-albuterol (DUONEB) 0.5-2.5 (3) MG/3ML SOLN Inhale 3 mLs into the lungs every 4 (four) hours as needed (shortness of breath/wheezing).     iron polysaccharides (NIFEREX) 150 MG capsule Take 150 mg by mouth daily.    levothyroxine (SYNTHROID, LEVOTHROID) 100 MCG tablet Take 100 mcg by mouth daily before breakfast.     magnesium oxide (MAG-OX) 400 MG tablet Take 400 mg by mouth daily.    meclizine (ANTIVERT) 25 MG tablet Take 25 mg by mouth 2 (two) times daily as needed for dizziness.     metFORMIN (GLUCOPHAGE) 500 MG tablet Take 500 mg by mouth 2 (two) times daily with a meal.    metolazone (ZAROXOLYN) 5 MG tablet Take 5  mg by mouth daily.    metoprolol (LOPRESSOR) 50 MG tablet Take 50 mg by mouth 2 (two) times daily.     montelukast (SINGULAIR) 10 MG tablet Take 1 tablet by mouth at bedtime.     nystatin (NYSTATIN) powder Apply topically See admin instructions. Apply to abdominal folds and groin topically twice daily    oxycodone (OXY-IR) 5 MG capsule Take 5 mg by mouth every 4 (four) hours as needed.    pantoprazole (PROTONIX) 40 MG tablet Take 40 mg by mouth daily.     senna-docusate (SENOKOT-S) 8.6-50 MG tablet Take 2 tablets by mouth daily.    Suvorexant (BELSOMRA) 10 MG TABS Take 10 mg by mouth at bedtime.    ziprasidone (GEODON) 80 MG capsule Take 80 mg by mouth 2 (two) times daily  with a meal.    torsemide (DEMADEX) 10 MG tablet Take 10 mg by mouth daily.      STOP taking these medications     aspirin EC 81 MG tablet      hydrochlorothiazide (MICROZIDE) 12.5 MG capsule      isosorbide mononitrate (IMDUR) 60 MG 24 hr tablet           Today   CHIEF COMPLAINT:  o acute events overnight   VITAL SIGNS:  Blood pressure (!) 103/38, pulse 81, temperature 97.8 F (36.6 C), temperature source Oral, resp. rate 18, height 5\' 6"  (1.676 m), weight (!) 166.5 kg (367 lb), SpO2 95 %.   REVIEW OF SYSTEMS:  Review of Systems  Constitutional: Negative.  Negative for chills, fever and malaise/fatigue.  HENT: Negative.  Negative for ear discharge, ear pain, hearing loss, nosebleeds and sore throat.   Eyes: Negative.  Negative for blurred vision and pain.  Respiratory: Negative.  Negative for cough, hemoptysis, shortness of breath and wheezing.   Cardiovascular: Negative.  Negative for chest pain, palpitations and leg swelling.  Gastrointestinal: Negative.  Negative for abdominal pain, blood in stool, diarrhea, nausea and vomiting.  Genitourinary: Negative.  Negative for dysuria.  Musculoskeletal: Negative.  Negative for back pain.       Wound vac placed to knee  Skin: Negative.    Neurological: Negative for dizziness, tremors, speech change, focal weakness, seizures and headaches.  Endo/Heme/Allergies: Negative.  Does not bruise/bleed easily.  Psychiatric/Behavioral: Negative.  Negative for depression, hallucinations and suicidal ideas.     PHYSICAL EXAMINATION:  GENERAL:  71 y.o.-year-old patient lying in the bed with no acute distress. Morbidly obese NECK:  Supple, no jugular venous distention. No thyroid enlargement, no tenderness.  LUNGS: Normal breath sounds bilaterally, no wheezing, rales,rhonchi  No use of accessory muscles of respiration.  CARDIOVASCULAR: S1, S2 normal. No murmurs, rubs, or gallops.  ABDOMEN: Soft, non-tender, non-distended. Bowel sounds present. No organomegaly or mass.  EXTREMITIES: mild right lo pedal edema,patient has wound VAC no cyanosis, or clubbing.  PSYCHIATRIC: The patient is alert and oriented x 3.  SKIN: she has wound VAC placed right knee  DATA REVIEW:   CBC  Recent Labs Lab 09/23/16 0324  WBC 7.3  HGB 10.3*  HCT 32.8*  PLT 375    Chemistries   Recent Labs Lab 09/21/16 1919 09/22/16 0329  09/23/16 0324  NA 131* 132*  < > 135  K 3.4* 2.7*  < > 3.4*  CL 84* 88*  < > 98*  CO2 31 33*  < > 29  GLUCOSE 244* 164*  < > 153*  BUN 72* 65*  < > 39*  CREATININE 2.15* 1.90*  < > 1.19  CALCIUM 9.3 9.1  < > 8.8*  MG  --  2.4  --   --   AST 35  --   --   --   ALT 24  --   --   --   ALKPHOS 86  --   --   --   BILITOT 0.6  --   --   --   < > = values in this interval not displayed.  Cardiac Enzymes No results for input(s): TROPONINI in the last 168 hours.  Microbiology Results  @[email or web address] @  RADIOLOGY:  Ct Knee Right Wo Contrast  Result Date: 09/22/2016 CLINICAL DATA:  Large open wound below the knee EXAM: CT OF THE right KNEE WITHOUT CONTRAST TECHNIQUE: Multidetector CT imaging of the right knee was  performed according to the standard protocol. Multiplanar CT image reconstructions were also generated.  COMPARISON:  Radiograph 09/21/2016 FINDINGS: Bones/Joint/Cartilage Healing nondisplaced proximal fibular fracture with sclerotic margins. Moderate severe narrowing of the medial joint space compartment with subchondral sclerosis and bony spurring. Mild degenerative changes of the lateral compartment with bony spurring. Moderate to marked patellofemoral degenerative changes greater on the lateral side with medial and lateral osteophytes. Superior and inferior patellar spurring. No large suprapatellar effusion. No obvious calcified loose bodies within the joint. Ligaments Suboptimally assessed by CT. Muscles and Tendons Diffuse fatty atrophy of the distal thigh and proximal calf muscles. Quadriceps tendon grossly intact. Patellar tendon grossly intact. Soft tissues Large amount of soft tissue edema and skin thickening anterior to the patella, lower knee, and proximal tibia and fibula. Soft tissue ulceration slightly inferior to the patella with scattered gas pockets in the anterior soft tissues. Large mildly hyperdense collection within the subcutaneous soft tissues anteriorly, this begins approximately at the inferior aspect of the patella and extends inferiorly to about the midshaft of the tibia. This measures approximately 20 more cm craniocaudad by 3.8 cm AP by 7.4 cm transverse. There is diffuse subcutaneous edema within the posterior as well. IMPRESSION: 1. Skin thickening and soft tissue ulceration anteriorly, inferior to the patella. Large mildly dense fluid collection anteriorly, spanning from the inferior aspect of the patella to the midshaft of the tibia, suspect that this may represent a hematoma or possible infected hemorrhagic fluid collection. Further evaluation limited without contrast. 2. Healing nondisplaced proximal fibular fracture. No periostitis or bony erosive changes. 3. Moderate severe arthritis of the right knee. Electronically Signed   By: Donavan Foil M.D.   On: 09/22/2016 01:14   Dg  Knee Complete 4 Views Right  Result Date: 09/21/2016 CLINICAL DATA:  Right knee wound. EXAM: RIGHT KNEE - COMPLETE 4+ VIEW COMPARISON:  Radiographs of July 20, 2016. FINDINGS: Minimally displaced fracture is seen involving the proximal fibula. Severe narrowing of medial joint space is noted with osteophyte formation. Moderate narrowing of patellofemoral space is noted with osteophyte formation. No joint effusion is noted. Soft tissue ulceration is seen anterior to the knee. IMPRESSION: Soft tissue ulceration or wound is noted anteriorly. Severe degenerative joint disease is noted. Minimally displaced proximal right fibular fracture. Electronically Signed   By: Marijo Conception, M.D.   On: 09/21/2016 19:47      Current Discharge Medication List    START taking these medications   Details  levofloxacin (LEVAQUIN) 750 MG tablet Take 1 tablet (750 mg total) by mouth daily. Qty: 4 tablet, Refills: 0      CONTINUE these medications which have CHANGED   Details  rivaroxaban (XARELTO) 20 MG TABS tablet Take 1 tablet (20 mg total) by mouth daily with supper. Qty: 30 tablet, Refills: 0      CONTINUE these medications which have NOT CHANGED   Details  albuterol (PROAIR HFA) 108 (90 Base) MCG/ACT inhaler Inhale 2 puffs into the lungs every 4 (four) hours as needed for wheezing or shortness of breath.    amLODipine (NORVASC) 10 MG tablet Take 10 mg by mouth daily.     atorvastatin (LIPITOR) 80 MG tablet Take 80 mg by mouth every evening.    buPROPion (WELLBUTRIN SR) 100 MG 12 hr tablet Take 100 mg by mouth daily.     celecoxib (CELEBREX) 200 MG capsule Take 200 mg by mouth 2 (two) times daily.     cetirizine (ZYRTEC) 10 MG tablet Take 10  mg by mouth daily.    Cholecalciferol (VITAMIN D3) 50000 units CAPS Take 50,000 Units by mouth every Thursday.     collagenase (SANTYL) ointment Apply 1 application topically See admin instructions. Apply to wound bed (nickel thick) daily for wound care     docusate sodium (COLACE) 100 MG capsule Take 100 mg by mouth 3 (three) times daily.    fluticasone (FLONASE) 50 MCG/ACT nasal spray Place 1 spray into both nostrils daily.    Fluticasone-Salmeterol (ADVAIR) 100-50 MCG/DOSE AEPB Inhale 1 puff into the lungs 2 (two) times daily.    furosemide (LASIX) 40 MG tablet Take 40 mg by mouth 2 (two) times daily.    gabapentin (NEURONTIN) 400 MG capsule Take 400 mg by mouth 3 (three) times daily.     hydrALAZINE (APRESOLINE) 25 MG tablet Take 25 mg by mouth 3 (three) times daily.     ipratropium-albuterol (DUONEB) 0.5-2.5 (3) MG/3ML SOLN Inhale 3 mLs into the lungs every 4 (four) hours as needed (shortness of breath/wheezing).     iron polysaccharides (NIFEREX) 150 MG capsule Take 150 mg by mouth daily.    levothyroxine (SYNTHROID, LEVOTHROID) 100 MCG tablet Take 100 mcg by mouth daily before breakfast.     magnesium oxide (MAG-OX) 400 MG tablet Take 400 mg by mouth daily.    meclizine (ANTIVERT) 25 MG tablet Take 25 mg by mouth 2 (two) times daily as needed for dizziness.     metFORMIN (GLUCOPHAGE) 500 MG tablet Take 500 mg by mouth 2 (two) times daily with a meal.    metolazone (ZAROXOLYN) 5 MG tablet Take 5 mg by mouth daily.    metoprolol (LOPRESSOR) 50 MG tablet Take 50 mg by mouth 2 (two) times daily.     montelukast (SINGULAIR) 10 MG tablet Take 1 tablet by mouth at bedtime.     nystatin (NYSTATIN) powder Apply topically See admin instructions. Apply to abdominal folds and groin topically twice daily    oxycodone (OXY-IR) 5 MG capsule Take 5 mg by mouth every 4 (four) hours as needed.    pantoprazole (PROTONIX) 40 MG tablet Take 40 mg by mouth daily.     senna-docusate (SENOKOT-S) 8.6-50 MG tablet Take 2 tablets by mouth daily.    Suvorexant (BELSOMRA) 10 MG TABS Take 10 mg by mouth at bedtime.    ziprasidone (GEODON) 80 MG capsule Take 80 mg by mouth 2 (two) times daily with a meal.    torsemide (DEMADEX) 10 MG tablet Take 10  mg by mouth daily.      STOP taking these medications     aspirin EC 81 MG tablet      hydrochlorothiazide (MICROZIDE) 12.5 MG capsule      isosorbide mononitrate (IMDUR) 60 MG 24 hr tablet           Management plans discussed with the patient and he is in agreement. Stable for discharge snf  Patient should follow up with dr Con Memos  CODE STATUS:     Code Status Orders        Start     Ordered   09/22/16 0204  Full code  Continuous     09/22/16 0204    Code Status History    Date Active Date Inactive Code Status Order ID Comments User Context   08/23/2016 10:40 AM 08/27/2016 11:21 PM Full Code [id number]  Brand Males, MD Inpatient   07/14/2016  8:10 AM 07/14/2016  7:07 PM Full Code [id number]  Saundra Shelling, MD ED   04/27/2016  2:28 AM 04/28/2016  5:18 PM Full Code [id number]  Lance Coon, MD Inpatient   02/16/2016  8:34 PM 02/17/2016  3:40 PM Full Code [id number]  Harvie Bridge, DO ED    Advance Directive Documentation     Most Recent Value  Type of Advance Directive  Living will  Pre-existing out of facility DNR order (yellow form or pink MOST form)  -  "MOST" Form in Place?  -      TOTAL TIME TAKING CARE OF THIS PATIENT: 37 minutes.    Note: This dictation was prepared with Dragon dictation along with smaller phrase technology. Any transcriptional errors that result from this process are unintentional.  Kijuana Ruppel M.D on 09/23/2016 at 11:16 AM  Between 7am to 6pm - Pager - [phone] After 6pm go to [email or web address] - password [email or web address]  Office  [phone]  CC: Primary care physician; Volanda Napoleon, MD

## 2016-09-23 NOTE — Care Management Important Message (Signed)
Important Message  Patient Details  Name: [name] MRN: [id number] Date of Birth: Dec 22, 1945   Medicare Important Message Given:  Yes    Jolly Mango, RN 09/23/2016, 9:48 AM

## 2016-09-25 LAB — AEROBIC CULTURE W GRAM STAIN (SUPERFICIAL SPECIMEN)

## 2016-09-25 LAB — AEROBIC CULTURE  (SUPERFICIAL SPECIMEN)

## 2016-09-26 LAB — CULTURE, BLOOD (ROUTINE X 2)
CULTURE: NO GROWTH
Culture: NO GROWTH
Special Requests: ADEQUATE
Special Requests: ADEQUATE

## 2016-09-28 LAB — AEROBIC/ANAEROBIC CULTURE (SURGICAL/DEEP WOUND): SPECIAL REQUESTS: NORMAL

## 2016-09-28 LAB — AEROBIC/ANAEROBIC CULTURE W GRAM STAIN (SURGICAL/DEEP WOUND)

## 2016-10-01 ENCOUNTER — Encounter: Payer: Medicare Other | Attending: Surgery | Admitting: Surgery

## 2016-10-01 DIAGNOSIS — Z880 Allergy status to penicillin: Secondary | ICD-10-CM | POA: Insufficient documentation

## 2016-10-01 DIAGNOSIS — L89892 Pressure ulcer of other site, stage 2: Secondary | ICD-10-CM | POA: Insufficient documentation

## 2016-10-01 DIAGNOSIS — D649 Anemia, unspecified: Secondary | ICD-10-CM | POA: Insufficient documentation

## 2016-10-01 DIAGNOSIS — I2510 Atherosclerotic heart disease of native coronary artery without angina pectoris: Secondary | ICD-10-CM | POA: Insufficient documentation

## 2016-10-01 DIAGNOSIS — Z8673 Personal history of transient ischemic attack (TIA), and cerebral infarction without residual deficits: Secondary | ICD-10-CM | POA: Insufficient documentation

## 2016-10-01 DIAGNOSIS — M12561 Traumatic arthropathy, right knee: Secondary | ICD-10-CM | POA: Insufficient documentation

## 2016-10-01 DIAGNOSIS — Z85828 Personal history of other malignant neoplasm of skin: Secondary | ICD-10-CM | POA: Insufficient documentation

## 2016-10-01 DIAGNOSIS — K219 Gastro-esophageal reflux disease without esophagitis: Secondary | ICD-10-CM | POA: Insufficient documentation

## 2016-10-01 DIAGNOSIS — Z888 Allergy status to other drugs, medicaments and biological substances status: Secondary | ICD-10-CM | POA: Insufficient documentation

## 2016-10-01 DIAGNOSIS — Z882 Allergy status to sulfonamides status: Secondary | ICD-10-CM | POA: Insufficient documentation

## 2016-10-01 DIAGNOSIS — L97812 Non-pressure chronic ulcer of other part of right lower leg with fat layer exposed: Secondary | ICD-10-CM | POA: Insufficient documentation

## 2016-10-01 DIAGNOSIS — Z951 Presence of aortocoronary bypass graft: Secondary | ICD-10-CM | POA: Insufficient documentation

## 2016-10-01 DIAGNOSIS — I509 Heart failure, unspecified: Secondary | ICD-10-CM | POA: Insufficient documentation

## 2016-10-01 DIAGNOSIS — Z833 Family history of diabetes mellitus: Secondary | ICD-10-CM | POA: Insufficient documentation

## 2016-10-01 DIAGNOSIS — E7800 Pure hypercholesterolemia, unspecified: Secondary | ICD-10-CM | POA: Insufficient documentation

## 2016-10-01 DIAGNOSIS — Z87891 Personal history of nicotine dependence: Secondary | ICD-10-CM | POA: Insufficient documentation

## 2016-10-01 DIAGNOSIS — E11622 Type 2 diabetes mellitus with other skin ulcer: Secondary | ICD-10-CM | POA: Insufficient documentation

## 2016-10-01 DIAGNOSIS — M71061 Abscess of bursa, right knee: Secondary | ICD-10-CM | POA: Insufficient documentation

## 2016-10-01 DIAGNOSIS — Z885 Allergy status to narcotic agent status: Secondary | ICD-10-CM | POA: Insufficient documentation

## 2016-10-01 DIAGNOSIS — Z8249 Family history of ischemic heart disease and other diseases of the circulatory system: Secondary | ICD-10-CM | POA: Insufficient documentation

## 2016-10-01 DIAGNOSIS — J449 Chronic obstructive pulmonary disease, unspecified: Secondary | ICD-10-CM | POA: Insufficient documentation

## 2016-10-01 DIAGNOSIS — I110 Hypertensive heart disease with heart failure: Secondary | ICD-10-CM | POA: Insufficient documentation

## 2016-10-02 NOTE — Progress Notes (Addendum)
[name], [name] ([id number]) Visit Report for 10/01/2016 Chief Complaint Document Details Patient Name: [name], [name]. Date of Service: 10/01/2016 8:45 AM Medical Record Number: [id number] Patient Account Number: [id number] Date of Birth/Sex: [birth year] (71 y.o. Male) Treating RN: Ahmed Prima Primary Care Provider: [name] Dupre Other Clinician: Referring Provider: [name] Dupre Treating Provider/Extender: Frann Rider in Treatment: 6 Information Obtained from: Patient Chief Complaint Patient is at the clinic for treatment of an open pressure ulcer to the area of the right knee for about 3 weeks now Electronic Signature(s) Signed: 10/01/2016 9:51:33 AM By: Christin Fudge MD, FACS Entered By: Christin Fudge on 10/01/2016 09:51:32 [name] ([id number]) -------------------------------------------------------------------------------- HPI Details Patient Name: [name] Gathers T. Date of Service: 10/01/2016 8:45 AM Medical Record Number: [id number] Patient Account Number: [id number] Date of Birth/Sex: 11-20-1945 (71 y.o. Male) Treating RN: Ahmed Prima Primary Care Provider: [name] Dupre Other Clinician: Referring Provider: [name] Dupre Treating Provider/Extender: Frann Rider in Treatment: 6 History of Present Illness Location: right knee swelling with skin ulceration Quality: Patient reports experiencing a dull pain to affected area(s). Severity: Patient states wound are getting worse. Duration: Patient has had the wound for <4 weeks prior to presenting for treatment Timing: Pain in wound is Intermittent (comes and goes Context: The wound occurred when the patient had a cast placed over his right lower extremity and with the cast was removed he had a swelling of the knee with problems with his skin Modifying Factors: Other treatment(s) tried include:orthopedics Associated Signs and Symptoms: swelling of both lower extremities with large  amount of swelling of the knee HPI Description: 71 year old gentleman who has been living in an independent living facility for long-term care has had recurrent falls and recently had a large hematoma on the right knee. Known to be on aspirin and Plavix for history of CABG and was seen in the ER at the end of February after a fall. past medical history of arthritis, congestive heart failure, diabetes mellitus, hypertension, status post CABG o2, status post cholecystectomy, hernia repair and kidney surgery. recent workup for the fall showed negative clinical findings in his extremities and it was thought that he was falling due to deconditioning. a x-ray showed a right fibular neck fracture and was placed in a splint. he was asked to be seen by orthopedic surgeon. the patient cannot tell me which orthopedic doctor had seen him and I'm not able to get any notes from [place], regarding his orthopedic care. The patient says twice during this last week he has had bloody fluid shooting out of his knee and has drained all over his boot and the flow. 08/21/2016 -- he was seen by the nurse practitioner at the facility and and distend she aspirated about 60 mL of hemorrhagic fluid from his right knee. 08/31/2016 -- since I saw the patient last he was admitted to the hospital at [facility] between 08/23/2016 2 08/27/2016 and was treated with a pulmonary embolism. he had EKOS complete, was started on Xarelto, and also treated for chronic diastolic congestive heart failure. he was asked to follow-up with the wound center and continue management locally. 09/07/2016 -- he has an appointment to see his orthopedic surgeon later this week. 09/14/2016 -- he did see the orthopedic service and they have asked him to continue with physical therapy. They discontinued the boot and the x-ray done showed a healed fracture. They said it was okay to use a wound VAC if needed. Addendum -- we got the note from the  Orthopedic  office where he was seen by [facility] orthopedics on [facility] in [place] and was seen by the PA Carlynn Spry. -- The x-ray which was done was reviewed and there is good healing of the fracture and from the orthopedic point of view he has healed well and they have asked him to continue with good control of his diabetes and management by the wound center including using a wound VAC if required. [name] ([id number]) 09/21/2016 -- the patient's wound VAC had been applied 2 days ago, but the black sponge had not been placed laterally under the skin flap and into the area of the right of the knee, where there was a large cavity. On examination, after removal of his wound VAC, there is a large collection of seropurulent fluid and hematoma in the area starting at the lateral knee and going down to his right lateral compartment of the leg. Clinically, this is a huge infected abscess cavity 10/01/2016 -- patient was admitted between 09/21/2016 and 09/23/2016 with cellulitis of the right lower extremity with a large infected hematoma which was drained at the bedside by surgical services. he was treated with Levaquin and continues to be on Xarelto. the patient is back on a wound VAC which has not been functioning due to the large depth of the hematoma and organized clots. He is still on Keflex orally. Addendum : I spoke to his surgeon Dr. Tama High and we discussed options for further draining this large hematoma which I suspect will get infected sooner than later. I expressed the fact that the wound VAC does not seem to be functioning well and competent enough to drain this large cavity. He will call for him in the office Electronic Signature(s) Signed: 10/01/2016 2:24:00 PM By: Christin Fudge MD, FACS Previous Signature: 10/01/2016 9:52:16 AM Version By: Christin Fudge MD, FACS Previous Signature: 10/01/2016 9:22:27 AM Version By: Christin Fudge MD, FACS Entered By: Christin Fudge on  10/01/2016 14:23:59 [name], [name] ([id number]) -------------------------------------------------------------------------------- Physical Exam Details Patient Name: [name], [name] Date of Service: 10/01/2016 8:45 AM Medical Record Number: [id number] Patient Account Number: [id number] Date of Birth/Sex: 06/21/1945 (71 y.o. Male) Treating RN: Ahmed Prima Primary Care Provider: [name] Dupre Other Clinician: Referring Provider: [name] Dupre Treating Provider/Extender: Frann Rider in Treatment: 6 Constitutional . Pulse regular. Respirations normal and unlabored. Afebrile. . Eyes Nonicteric. Reactive to light. Ears, Nose, Mouth, and Throat Lips, teeth, and gums WNL.[name] Moist mucosa without lesions. Neck supple and nontender. No palpable supraclavicular or cervical adenopathy. Normal sized without goiter. Respiratory WNL. No retractions.. Breath sounds WNL, No rubs, rales, rhonchi, or wheeze.. Cardiovascular Heart rhythm and rate regular, no murmur or gallop.. Pedal Pulses WNL. No clubbing, cyanosis or edema. Chest Breasts symmetical and no nipple discharge.. Breast tissue WNL, no masses, lumps, or tenderness.. Lymphatic No adneopathy. No adenopathy. No adenopathy. Musculoskeletal Adexa without tenderness or enlargement.. Digits and nails w/o clubbing, cyanosis, infection, petechiae, ischemia, or inflammatory conditions.. Integumentary (Hair, Skin) No suspicious lesions. No crepitus or fluctuance. No peri-wound warmth or erythema. No masses.[name] Psychiatric Judgement and insight Intact.. No evidence of depression, anxiety, or agitation.. Notes the wound at the knee has a large tunneling down towards the 9:00 and 6:00 position and this has a cavity which is filled with hematoma, broken down fat globules and does not get sucked out by the wound VAC. I was able to express a lot of it. Electronic Signature(s) Signed: 10/01/2016 9:53:33 AM By: Christin Fudge MD,  FACS Entered By: Christin Fudge on 10/01/2016 09:53:33 [name], [name] ([id number]) -------------------------------------------------------------------------------- Physician Orders Details Patient Name: [name]. Date of Service: 10/01/2016 8:45 AM Medical Record Number: [id number] Patient Account Number: [id number] Date of Birth/Sex: 1945/09/22 (71 y.o. Male) Treating RN: Ahmed Prima Primary Care Provider: [name] Dupre Other Clinician: Referring Provider: [name] Dupre Treating Provider/Extender: Frann Rider in Treatment: 6 Verbal / Phone Orders: Yes Clinician: Carolyne Fiscal, Debi Read Back and Verified: Yes Diagnosis Coding Wound Cleansing Wound #1 Right,Anterior Knee o Clean wound with Normal Saline. o Cleanse wound with mild soap and water Anesthetic Wound #1 Right,Anterior Knee o Topical Lidocaine 4% cream applied to wound bed prior to debridement - Only in the Wound Clinic Primary Wound Dressing Wound #1 Right,Anterior Knee o Other: - saline moisten kerlix and pack from 9 o'clock to 6 o'clock Secondary Dressing Wound #1 Right,Anterior Knee o ABD pad o Dry Gauze o Conform/Kerlix o Other - tape Dressing Change Frequency Wound #1 Right,Anterior Knee o Other: - change dressing 3 times a DAY Follow-up Appointments Wound #1 Right,Anterior Knee o Return Appointment in 1 week. Edema Control Wound #1 Right,Anterior Knee o Elevate legs to the level of the heart and pump ankles as often as possible Additional Orders / Instructions Wound #1 Right,Anterior Knee Blish, Rasean T. ([id number]) o Increase protein intake. o Activity as tolerated Negative Pressure Wound Therapy Wound #1 Right,Anterior Knee o Discontinue NPWT. - Stop Wound Vac Medications-please add to medication list. Wound #1 Right,Anterior Knee o Other: - Vitamin C, Zinc, MVI Electronic Signature(s) Signed: 10/01/2016 2:56:18 PM By: Alric Quan Signed: 10/01/2016 4:13:28 PM By: Christin Fudge MD, FACS Entered By: Alric Quan on 10/01/2016 09:31:33 [name], [name] ([id number]) -------------------------------------------------------------------------------- Problem List Details Patient Name: [name], [name] Date of Service: 10/01/2016 8:45 AM Medical Record Number: [id number] Patient Account Number: [id number] Date of Birth/Sex: [birth year] (71 y.o. 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/14/2016 Yes with fat layer exposed L89.892 Pressure ulcer of other site, stage 2 08/14/2016 Yes M70.861 Other soft tissue disorders related to use, overuse and 08/14/2016 Yes pressure, right lower leg M71.061 Abscess of bursa, right knee 09/21/2016 Yes Inactive Problems Resolved Problems Electronic Signature(s) Signed: 10/01/2016 9:51:20 AM By: Christin Fudge MD, FACS Entered By: Christin Fudge on 10/01/2016 09:51:19 [name], [name] ([id number]) -------------------------------------------------------------------------------- Progress Note Details Patient Name: [name] Gathers T. Date of Service: 10/01/2016 8:45 AM Medical Record Number: [id number] Patient Account Number: [id number] Date of Birth/Sex: 06/04/1945 (71 y.o. Male) Treating RN: Ahmed Prima Primary Care Provider: [name] Dupre Other Clinician: Referring Provider: [name] Dupre Treating Provider/Extender: Frann Rider in Treatment: 6 Subjective Chief Complaint Information obtained from Patient Patient is at the clinic for treatment of  an open pressure ulcer to the area of the right knee for about 3 weeks now History of Present Illness (HPI) The following HPI elements were documented for the patient's wound: Location: right knee swelling with skin ulceration Quality: Patient reports experiencing a dull pain to affected area(s). Severity: Patient states wound are getting worse. Duration: Patient has had the wound for Timing: Pain in wound is Intermittent (comes and goes Context: The wound occurred when the patient had a cast  placed over his right lower extremity and with the cast was removed he had a swelling of the knee with problems with his skin Modifying Factors: Other treatment(s) tried include:orthopedics Associated Signs and Symptoms: swelling of both lower extremities with large amount of swelling of the knee 71 year old gentleman who has been living in an independent living facility for long-term care has had recurrent falls and recently had a large hematoma on the right knee. Known to be on aspirin and Plavix for history of CABG and was seen in the ER at the end of February after a fall. past medical history of arthritis, congestive heart failure, diabetes mellitus, hypertension, status post CABG o2, status post cholecystectomy, hernia repair and kidney surgery. recent workup for the fall showed negative clinical findings in his extremities and it was thought that he was falling due to deconditioning. a x-ray showed a right fibular neck fracture and was placed in a splint. he was asked to be seen by orthopedic surgeon. the patient cannot tell me which orthopedic doctor had seen him and I'm not able to get any notes from [place], regarding his orthopedic care. The patient says twice during this last week he has had bloody fluid shooting out of his knee and has drained all over his boot and the flow. 08/21/2016 -- he was seen by the nurse practitioner at the facility and and distend she aspirated about 60 mL of  hemorrhagic fluid from his right knee. 08/31/2016 -- since I saw the patient last he was admitted to the hospital at [facility] between 08/23/2016 2 08/27/2016 and was treated with a pulmonary embolism. he had EKOS complete, was started on Xarelto, and also treated for chronic diastolic congestive heart failure. he was asked to follow-up with the wound center and continue management locally. 09/07/2016 -- he has an appointment to see his orthopedic surgeon later this week. 09/14/2016 -- he did see the orthopedic service and they have asked him to continue with physical therapy. [name], [name] ([id number]) They discontinued the boot and the x-ray done showed a healed fracture. They said it was okay to use a wound VAC if needed. Addendum -- we got the note from the Orthopedic office where he was seen by [facility] orthopedics on [facility] in [place] and was seen by the PA Carlynn Spry. -- The x-ray which was done was reviewed and there is good healing of the fracture and from the orthopedic point of view he has healed well and they have asked him to continue with good control of his diabetes and management by the wound center including using a wound VAC if required. 09/21/2016 -- the patient's wound VAC had been applied 2 days ago, but the black sponge had not been placed laterally under the skin flap and into the area of the right of the knee, where there was a large cavity. On examination, after removal of his wound VAC, there is a large collection of seropurulent fluid and hematoma in the area starting at the lateral knee and going down to his right lateral compartment of the leg. Clinically, this is a huge infected abscess cavity 10/01/2016 -- patient was admitted between 09/21/2016 and 09/23/2016 with cellulitis of the right lower extremity with a large infected hematoma which was drained at the bedside by surgical services. he was treated with Levaquin and continues to be on  Xarelto. the patient is back on a wound VAC which has not been functioning due to the large depth of the  hematoma and organized clots. He is still on Keflex orally. Addendum : I spoke to his surgeon Dr. Tama High and we discussed options for further draining this large hematoma which I suspect will get infected sooner than later. I expressed the fact that the wound VAC does not seem to be functioning well and competent enough to drain this large cavity. He will call for him in the office Objective Constitutional Pulse regular. Respirations normal and unlabored. Afebrile. Vitals Time Taken: 8:49 AM, Height: 67 in, Weight: 357 lbs, BMI: 55.9, Temperature: 97.3 F, Pulse: 101 bpm, Respiratory Rate: 16 breaths/min, Blood Pressure: 128/44 mmHg. Eyes Nonicteric. Reactive to light. Ears, Nose, Mouth, and Throat Lips, teeth, and gums WNL.[name] Moist mucosa without lesions. Neck supple and nontender. No palpable supraclavicular or cervical adenopathy. Normal sized without goiter. Respiratory WNL. No retractions.. Breath sounds WNL, No rubs, rales, rhonchi, or wheeze.[name] [name] ([id number]) Cardiovascular Heart rhythm and rate regular, no murmur or gallop.. Pedal Pulses WNL. No clubbing, cyanosis or edema. Chest Breasts symmetical and no nipple discharge.. Breast tissue WNL, no masses, lumps, or tenderness.. Lymphatic No adneopathy. No adenopathy. No adenopathy. Musculoskeletal Adexa without tenderness or enlargement.. Digits and nails w/o clubbing, cyanosis, infection, petechiae, ischemia, or inflammatory conditions.[name] Psychiatric Judgement and insight Intact.. No evidence of depression, anxiety, or agitation.. General Notes: the wound at the knee has a large tunneling down towards the 9:00 and 6:00 position and this has a cavity which is filled with hematoma, broken down fat globules and does not get sucked out by the wound VAC. I was able to express a lot of it. Integumentary (Hair,  Skin) No suspicious lesions. No crepitus or fluctuance. No peri-wound warmth or erythema. No masses.. Wound #1 status is Open. Original cause of wound was Pressure Injury. The wound is located on the Right,Anterior Knee. The wound measures 1.8cm length x 3.4cm width x 0.6cm depth; 4.807cm^2 area and 2.884cm^3 volume. There is Fat Layer (Subcutaneous Tissue) Exposed exposed. There is no tunneling noted, however, there is undermining starting at 9:00 and ending at 7:00. There is a large amount of purulent drainage noted. The wound margin is flat and intact. There is large (67-100%) granulation within the wound bed. There is a small (1-33%) amount of necrotic tissue within the wound bed including Adherent Slough. The periwound skin appearance exhibited: Erythema. The periwound skin appearance did not exhibit: Callus, Crepitus, Excoriation, Induration, Rash, Scarring, Dry/Scaly, Maceration, Atrophie Blanche, Cyanosis, Ecchymosis, Hemosiderin Staining, Mottled, Pallor, Rubor. The surrounding wound skin color is noted with erythema which is circumferential. Periwound temperature was noted as No Abnormality. The periwound has tenderness on palpation. 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 ([id number]) M71.061 - Abscess of bursa, right knee I noted details of the patient's recent hospitalization after I had noted a large hematoma and possible abscess in the right lower extremity. Unfortunately, the bedside debridement has not been effective in continuously draining this fluid which we collects in the pocket in the  right anterior lateral leg. Large quantiy of organized clots and fat were removed today. I have discontinued the wound VAC and recommended a Kerlix gauze to be packed down  into the wound and change 3 times a day. I have put in a call to the attending surgeon, Dr. Tama High and will discuss the case further. I believe the patient will need an appropriate surgical evacuation of this hematoma and a large-bore JP drain to help keep this continuously drained. Hopefully, we will be able to get this organized for this unfortunate patient Plan Wound Cleansing: Wound #1 Right,Anterior Knee: Clean wound with Normal Saline. Cleanse wound with mild soap and water Anesthetic: Wound #1 Right,Anterior Knee: Topical Lidocaine 4% cream applied to wound bed prior to debridement - Only in the Wound Clinic Primary Wound Dressing: Wound #1 Right,Anterior Knee: Other: - saline moisten kerlix and pack from 9 o'clock to 6 o'clock Secondary Dressing: Wound #1 Right,Anterior Knee: ABD pad Dry Gauze Conform/Kerlix Other - tape Dressing Change Frequency: Wound #1 Right,Anterior Knee: Other: - change dressing 3 times a DAY Follow-up Appointments: Wound #1 Right,Anterior Knee: Return Appointment in 1 week. Edema Control: Wound #1 Right,Anterior Knee: Elevate legs to the level of the heart and pump ankles as often as possible Additional Orders / Instructions: Wound #1 Right,Anterior Knee: Increase protein intake. Activity as tolerated Negative Pressure Wound Therapy: [name], [name] T. ([id number]) Wound #1 Right,Anterior Knee: Discontinue NPWT. - Stop Wound Vac Medications-please add to medication list.: Wound #1 Right,Anterior Knee: Other: - Vitamin C, Zinc, MVI I noted details of the patient's recent hospitalization after I had noted a large hematoma and possible abscess in the right lower extremity. Unfortunately, the bedside debridement has not been effective in continuously draining this fluid  which we collects in the pocket in the right anterior lateral leg. Large quantiy of organized clots and fat were removed today. I have discontinued the wound VAC and recommended a Kerlix gauze to be packed down into the wound and change 3 times a day. I have put in a call to the attending surgeon, Dr. Tama High and will discuss the case further. I believe the patient will need an appropriate surgical evacuation of this hematoma and a large-bore JP drain to help keep this continuously drained. Hopefully, we will be able to get this organized for this unfortunate patient Notes Addendum : I spoke to his surgeon Dr. Tama High and we discussed options for further draining this large hematoma which I suspect will get infected sooner than later. I expressed the fact that the wound VAC does not seem to be functioning well and competent enough to drain this large cavity. He will call for him in the office Electronic Signature(s) Signed: 10/01/2016 2:24:25 PM By: Christin Fudge MD, FACS Previous Signature: 10/01/2016 9:57:34 AM Version By: Christin Fudge MD, FACS Entered By: Christin Fudge on 10/01/2016 14:24:24 [place], [name] ([id number]) -------------------------------------------------------------------------------- SuperBill Details Patient Name: [name] Gathers T. Date of Service: 10/01/2016 Medical Record Number: [id number] Patient Account Number: [id number] Date of Birth/Sex: 12-05-1945 (71 y.o. 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 L89.892 Pressure ulcer of other site, stage 2 M70.861 Other soft tissue  disorders related to use, overuse and pressure, right lower leg M71.061 Abscess of bursa, right knee Facility Procedures CPT4 Code: [id number] Description: 99213 - WOUND CARE VISIT-LEV 3 EST PT Modifier: Quantity: 1 Physician Procedures  CPT4: Description Modifier Quantity Code [id number] 93112 - WC PHYS LEVEL 3 - EST PT 1 ICD-10 Description Diagnosis E11.622 Type 2 diabetes mellitus with other skin ulcer L97.812 Non-pressure chronic ulcer of other part of right lower leg with fat layer  exposed M71.061 Abscess of bursa, right knee M70.861 Other soft tissue disorders related to use, overuse and pressure, right lower leg Electronic Signature(s) Signed: 10/01/2016 9:57:48 AM By: Christin Fudge MD, FACS Entered By: Christin Fudge on 10/01/2016 09:57:47

## 2016-10-03 NOTE — Progress Notes (Signed)
ZALYN, AMEND ([id number]) Visit Report for 10/01/2016 Arrival Information Details Patient Name: [name], [name]. Date of Service: 10/01/2016 8:45 AM Medical Record Number: [id number] Patient Account Number: [id number] Date of Birth/Sex: Aug 12, 1945 (72 y.o. Male) Treating RN: Ahmed Prima Primary Care Pearlee Arvizu: [name] Dupre Other Clinician: Referring Jeanita Carneiro: [name] Dupre Treating Maykayla Highley/Extender: Frann Rider in Treatment: 6 Visit Information History Since Last Visit All ordered tests and consults were completed: No Patient Arrived: Wheel Chair Added or deleted any medications: No Arrival Time: 08:49 Any new allergies or adverse reactions: No Accompanied By: self Had a fall or experienced change in No Transfer Assistance: EasyPivot activities of daily living that may affect Patient Lift risk of falls: Patient Identification Verified: Yes Signs or symptoms of abuse/neglect since last No Secondary Verification Process Yes visito Completed: Hospitalized since last visit: No Patient Requires Transmission- No Has Dressing in Place as Prescribed: Yes Based Precautions: Pain Present Now: No Patient Has Alerts: No Electronic Signature(s) Signed: 10/01/2016 2:56:18 PM By: Alric Quan Entered By: Alric Quan on 10/01/2016 08:49:30 [name], [name] ([id number]) -------------------------------------------------------------------------------- Clinic Level of Care Assessment Details Patient Name: [name]. Date of Service: 10/01/2016 8:45 AM Medical Record Number: [id number] Patient Account Number: [id number] Date of Birth/Sex: [birth year] (71 y.o. Male) Treating RN: Ahmed Prima Primary Care Bricyn Labrada: [name] Dupre Other Clinician: Referring Jurell Basista: [name] Dupre Treating Manmeet Arzola/Extender: Frann Rider in Treatment: 6 Clinic Level of Care Assessment Items TOOL 4 Quantity Score X - Use when only an EandM is performed on  FOLLOW-UP visit 1 0 ASSESSMENTS - Nursing Assessment / Reassessment X - Reassessment of Co-morbidities (includes updates in patient status) 1 10 X - Reassessment of Adherence to Treatment Plan 1 5 ASSESSMENTS - Wound and Skin Assessment / Reassessment X - Simple Wound Assessment / Reassessment - one wound 1 5 []  - Complex Wound Assessment / Reassessment - multiple wounds 0 []  - Dermatologic / Skin Assessment (not related to wound area) 0 ASSESSMENTS - Focused Assessment []  - Circumferential Edema Measurements - multi extremities 0 []  - Nutritional Assessment / Counseling / Intervention 0 []  - Lower Extremity Assessment (monofilament, tuning fork, pulses) 0 []  - Peripheral Arterial Disease Assessment (using hand held doppler) 0 ASSESSMENTS - Ostomy and/or Continence Assessment and Care []  - Incontinence Assessment and Management 0 []  - Ostomy Care Assessment and Management (repouching, etc.) 0 PROCESS - Coordination of Care X - Simple Patient / Family Education for ongoing care 1 15 []  - Complex (extensive) Patient / Family Education for ongoing care 0 []  - Staff obtains Programmer, systems, Records, Test Results / Process Orders 0 []  - Staff telephones HHA, Nursing Homes / Clarify orders / etc 0 []  - Routine Transfer to another Facility (non-emergent condition) 0 Doris, Jamarquis T. ([id number]) []  - Routine Hospital Admission (non-emergent condition) 0 []  - New Admissions / Biomedical engineer / Ordering NPWT, Apligraf, etc. 0 []  - Emergency Hospital Admission (emergent condition) 0 X - Simple Discharge Coordination 1 10 []  - Complex (extensive) Discharge Coordination 0 PROCESS - Special Needs []  - Pediatric / Minor Patient Management 0 []  - Isolation Patient Management 0 []  - Hearing / Language / Visual special needs 0 []  - Assessment of Community assistance (transportation, D/C planning, etc.) 0 []  - Additional assistance / Altered mentation 0 []  - Support Surface(s) Assessment (bed,  cushion, seat, etc.) 0 INTERVENTIONS - Wound Cleansing / Measurement []  - Simple Wound Cleansing - one wound 0 X - Complex Wound Cleansing - multiple wounds 1 5 X -  Wound Imaging (photographs - any number of wounds) 1 5 []  - Wound Tracing (instead of photographs) 0 []  - Simple Wound Measurement - one wound 0 X - Complex Wound Measurement - multiple wounds 1 5 INTERVENTIONS - Wound Dressings []  - Small Wound Dressing one or multiple wounds 0 []  - Medium Wound Dressing one or multiple wounds 0 X - Large Wound Dressing one or multiple wounds 1 20 X - Application of Medications - topical 1 5 []  - Application of Medications - injection 0 INTERVENTIONS - Miscellaneous []  - External ear exam 0 [name], [name] ([id number]) []  - Specimen Collection (cultures, biopsies, blood, body fluids, etc.) 0 []  - Specimen(s) / Culture(s) sent or taken to Lab for analysis 0 []  - Patient Transfer (multiple staff / Harrel Lemon Lift / Similar devices) 0 []  - Simple Staple / Suture removal (25 or less) 0 []  - Complex Staple / Suture removal (26 or more) 0 []  - Hypo / Hyperglycemic Management (close monitor of Blood Glucose) 0 []  - Ankle / Brachial Index (ABI) - do not check if billed separately 0 X - Vital Signs 1 5 Has the patient been seen at the hospital within the last three years: Yes Total Score: 90 Level Of Care: New/Established - Level 3 Electronic Signature(s) Signed: 10/01/2016 2:56:18 PM By: Alric Quan Entered By: Alric Quan on 10/01/2016 09:51:41 [name], [name] ([id number]) -------------------------------------------------------------------------------- Encounter Discharge Information Details Patient Name: [name] Gathers T. Date of Service: 10/01/2016 8:45 AM Medical Record Number: [id number] Patient Account Number: [id number] Date of Birth/Sex: [birth year] (71 y.o. Male) Treating RN: Ahmed Prima Primary Care Torri Langston: [name] Dupre Other Clinician: Referring Arrianna Catala:  [name] Dupre Treating Jaquesha Boroff/Extender: Frann Rider in Treatment: 6 Encounter Discharge Information Items Discharge Pain Level: 0 Discharge Condition: Stable Ambulatory Status: Wheelchair Discharge Destination: Nursing Home Transportation: Other Accompanied By: self Schedule Follow-up Appointment: Yes Medication Reconciliation completed and provided to Patient/Care No Amerika Nourse: Provided on Clinical Summary of Care: 10/01/2016 Form Type Recipient Paper Patient JE Electronic Signature(s) Signed: 10/01/2016 2:56:18 PM By: Alric Quan Previous Signature: 10/01/2016 9:42:13 AM Version By: Ruthine Dose Entered By: Alric Quan on 10/01/2016 09:49:36 [name], [name] ([id number]) -------------------------------------------------------------------------------- Lower Extremity Assessment Details Patient Name: [name] Gathers T. Date of Service: 10/01/2016 8:45 AM Medical Record Number: [id number] Patient Account Number: [id number] Date of Birth/Sex: 1946/01/22 (71 y.o. Male) Treating RN: Ahmed Prima Primary Care Eythan Jayne: [name] Dupre Other Clinician: Referring Aashika Carta: [name] Dupre Treating Demetric Dunnaway/Extender: Frann Rider in Treatment: 6 Vascular Assessment Pulses: Dorsalis Pedis Palpable: [Right:Yes] Posterior Tibial Extremity colors, hair growth, and conditions: Extremity Color: [Right:Normal] Temperature of Extremity: [Right:Warm] Capillary Refill: [Right:< 3 seconds] Electronic Signature(s) Signed: 10/01/2016 2:56:18 PM By: Alric Quan Entered By: Alric Quan on 10/01/2016 09:07:31 [name], [name] ([id number]) -------------------------------------------------------------------------------- Multi Wound Chart Details Patient Name: [name] Gathers T. Date of Service: 10/01/2016 8:45 AM Medical Record Number: [id number] Patient Account Number: [id number] Date of Birth/Sex: 1945/07/01 (71 y.o. Male) Treating RN: Ahmed Prima Primary Care Rita Vialpando: [name] Dupre Other Clinician: Referring Ewell Benassi: [name] Dupre Treating Christeena Krogh/Extender: Frann Rider in Treatment: 6 Vital Signs Height(in): 67 Pulse(bpm): 101 Weight(lbs): 357 Blood Pressure 128/44 (mmHg): Body Mass Index(BMI): 56 Temperature(F): 97.3 Respiratory Rate 16 (breaths/min): Photos: [1:No Photos] [N/A:N/A] Wound Location: [1:Right Knee - Anterior] [N/A:N/A] Wounding Event: [1:Pressure Injury] [N/A:N/A] Primary Etiology: [1:Diabetic Wound/Ulcer of the Lower Extremity] [N/A:N/A] Comorbid History: [1:Anemia, Asthma, Arrhythmia, Congestive Heart Failure, Coronary Artery Disease, Hypertension, Type II Diabetes, Osteoarthritis] [N/A:N/A] Date Acquired: [1:07/23/2016] [N/A:N/A] Weeks of Treatment: [1:6] [N/A:N/A]  Wound Status: [1:Open] [N/A:N/A] Measurements L x W x D 1.8x3.4x0.6 [N/A:N/A] (cm) Area (cm) : [1:4.807] [N/A:N/A] Volume (cm) : [1:2.884] [N/A:N/A] % Reduction in Area: [1:38.80%] [N/A:N/A] % Reduction in Volume: -267.40% [N/A:N/A] Starting Position 1 9 (o'clock): Ending Position 1 [1:7] (o'clock): Undermining: [1:Yes] [N/A:N/A] Classification: [1:Grade 1] [N/A:N/A] Exudate Amount: [1:Large] [N/A:N/A] Exudate Type: [1:Purulent] [N/A:N/A] Exudate Color: [1:yellow, brown, green] [N/A:N/A] Wound Margin: [1:Flat and Intact] [N/A:N/A] Granulation Amount: Large (67-100%) N/A N/A Necrotic Amount: Small (1-33%) N/A N/A Exposed Structures: Fat Layer (Subcutaneous N/A N/A Tissue) Exposed: Yes Fascia: No Tendon: No Muscle: No Joint: No Bone: No Epithelialization: None N/A N/A Periwound Skin Texture: Excoriation: No N/A N/A Induration: No Callus: No Crepitus: No Rash: No Scarring: No Periwound Skin Maceration: No N/A N/A Moisture: Dry/Scaly: No Periwound Skin Color: Erythema: Yes N/A N/A Atrophie Blanche: No Cyanosis: No Ecchymosis: No Hemosiderin Staining: No Mottled: No Pallor: No Rubor:  No Erythema Location: Circumferential N/A N/A Temperature: No Abnormality N/A N/A Tenderness on Yes N/A N/A Palpation: Wound Preparation: Ulcer Cleansing: N/A N/A Rinsed/Irrigated with Saline Topical Anesthetic Applied: Other: Lidocaine 4% Assessment Notes: undermining non- N/A N/A measurable Treatment Notes Wound #1 (Right, Anterior Knee) 1. Cleansed with: Clean wound with Normal Saline 2. Anesthetic Topical Lidocaine 4% cream to wound bed prior to debridement 4. Dressing Applied: Other dressing (specify in notes) 5. Secondary Dressing Applied [name], [name] ([id number]) ABD Pad Kerlix/Conform 7. Secured with Tape Notes saline moistened kerlix packed into undermining Electronic Signature(s) Signed: 10/01/2016 9:51:24 AM By: Christin Fudge MD, FACS Entered By: Christin Fudge on 10/01/2016 09:51:24 [name] ([id number]) -------------------------------------------------------------------------------- [place] Details Patient Name: [name] Gathers T. Date of Service: 10/01/2016 8:45 AM Medical Record Number: [id number] Patient Account Number: [id number] Date of Birth/Sex: 04-12-1946 (71 y.o. Male) Treating RN: Carolyne Fiscal, Debi Primary Care Arlind Klingerman: [name] Dupre Other Clinician: Referring Hodan Wurtz: [name] Dupre Treating Trayton Szabo/Extender: Frann Rider in Treatment: 6 Active Inactive ` Orientation to the Wound Care Program Nursing Diagnoses: Knowledge deficit related to the wound healing center program Goals: Patient/caregiver will verbalize understanding of the [place] Program Date Initiated: 08/14/2016 Target Resolution Date: 12/14/2016 Goal Status: Active Interventions: Provide education on orientation to the wound center Notes: ` Pressure Nursing Diagnoses: Knowledge deficit related to causes and risk factors for pressure ulcer development Knowledge deficit related to management of pressures  ulcers Potential for impaired tissue integrity related to pressure, friction, moisture, and shear Goals: Patient will remain free from development of additional pressure ulcers Date Initiated: 08/14/2016 Target Resolution Date: 12/14/2016 Goal Status: Active Patient will remain free of pressure ulcers Date Initiated: 08/14/2016 Target Resolution Date: 12/14/2016 Goal Status: Active Patient/caregiver will verbalize risk factors for pressure ulcer development Date Initiated: 08/14/2016 Target Resolution Date: 12/14/2016 Goal Status: Active Patient/caregiver will verbalize understanding of pressure ulcer management Date Initiated: 08/14/2016 Target Resolution Date: 12/14/2016 Goal Status: Active [name], [name] ([id number]) Interventions: Assess: immobility, friction, shearing, incontinence upon admission and as needed Assess offloading mechanisms upon admission and as needed Assess potential for pressure ulcer upon admission and as needed Provide education on pressure ulcers Treatment Activities: Patient referred for seating evaluation to ensure proper offloading : 08/14/2016 Pressure reduction/relief device ordered : 08/14/2016 Notes: ` Wound/Skin Impairment Nursing Diagnoses: Impaired tissue integrity Knowledge deficit related to ulceration/compromised skin integrity Goals: Patient/caregiver will verbalize understanding of skin care regimen Date Initiated: 08/14/2016 Target Resolution Date: 12/14/2016 Goal Status: Active Ulcer/skin breakdown will have a volume reduction of 30% by week 4 Date Initiated: 08/14/2016 Target  Resolution Date: 12/14/2016 Goal Status: Active Ulcer/skin breakdown will have a volume reduction of 50% by week 8 Date Initiated: 08/14/2016 Target Resolution Date: 12/14/2016 Goal Status: Active Ulcer/skin breakdown will have a volume reduction of 80% by week 12 Date Initiated: 08/14/2016 Target Resolution Date: 12/14/2016 Goal Status: Active Ulcer/skin breakdown  will heal within 14 weeks Date Initiated: 08/14/2016 Target Resolution Date: 12/14/2016 Goal Status: Active Interventions: Assess patient/caregiver ability to obtain necessary supplies Assess patient/caregiver ability to perform ulcer/skin care regimen upon admission and as needed Assess ulceration(s) every visit Provide education on ulcer and skin care Treatment Activities: Referred to DME Eiliana Drone for dressing supplies : 08/14/2016 Skin care regimen initiated : 08/14/2016 [name], [name] ([id number]) Topical wound management initiated : 08/14/2016 Notes: Electronic Signature(s) Signed: 10/01/2016 2:56:18 PM By: Alric Quan Entered By: Alric Quan on 10/01/2016 09:14:44 [name], [name] ([id number]) -------------------------------------------------------------------------------- Pain Assessment Details Patient Name: [name] Gathers T. Date of Service: 10/01/2016 8:45 AM Medical Record Number: [id number] Patient Account Number: [id number] Date of Birth/Sex: July 18, 1945 (71 y.o. Male) Treating RN: Ahmed Prima Primary Care Shary Lamos: [name] Dupre Other Clinician: Referring Raliyah Montella: [name] Dupre Treating Cherice Glennie/Extender: Frann Rider in Treatment: 6 Active Problems Location of Pain Severity and Description of Pain Patient Has Paino No Site Locations With Dressing Change: No Pain Management and Medication Current Pain Management: Electronic Signature(s) Signed: 10/01/2016 2:56:18 PM By: Alric Quan Entered By: Alric Quan on 10/01/2016 08:49:36 Kallenbach, [name] ([id number]) -------------------------------------------------------------------------------- Patient/Caregiver Education Details Patient Name: [name]. Date of Service: 10/01/2016 8:45 AM Medical Record Number: [id number] Patient Account Number: [id number] Date of Birth/Gender: 1945-10-22 (71 y.o. Male) Treating RN: Ahmed Prima Primary Care Physician: [name] Dupre Other Clinician: Referring Physician: [name] Dupre Treating Physician/Extender: Frann Rider in Treatment: 6 Education Assessment Education Provided To: Patient and Caregiver Education Topics Provided Wound/Skin Impairment: Handouts: Other: change dressing as ordered Methods: Demonstration, Explain/Verbal Responses: State content correctly Electronic Signature(s) Signed: 10/01/2016 2:56:18 PM By: Alric Quan Entered By: Alric Quan on 10/01/2016 09:49:59 [name], [name] ([id number]) -------------------------------------------------------------------------------- Wound Assessment Details Patient Name: [name] Gathers T. Date of Service: 10/01/2016 8:45 AM Medical Record Number: [id number] Patient Account Number: [id number] Date of Birth/Sex: 1946/04/25 (71 y.o. Male) Treating RN: Ahmed Prima Primary Care Donold Marotto: [name] Dupre Other Clinician: Referring Amiere Cawley: [name] Dupre Treating Shepherd Finnan/Extender: Frann Rider in Treatment: 6 Wound Status Wound Number: 1 Primary Diabetic Wound/Ulcer of the Lower Etiology: Extremity Wound Location: Right Knee - Anterior Wound Open Wounding Event: Pressure Injury Status: Date Acquired: 07/23/2016 Comorbid Anemia, Asthma, Arrhythmia, Weeks Of Treatment: 6 History: Congestive Heart Failure, Coronary Clustered Wound: No Artery Disease, Hypertension, Type II Diabetes, Osteoarthritis Photos Photo Uploaded By: Alric Quan on 10/01/2016 14:37:34 Wound Measurements Length: (cm) 1.8 % Reduction in Width: (cm) 3.4 % Reduction in Depth: (cm) 0.6 Epithelializati Area: (cm) 4.807 Tunneling: Volume: (cm) 2.884 Undermining: Starting Pos Ending Posit Area: 38.8% Volume: -267.4% on: None No Yes ition (o'clock): 9 ion (o'clock): 7 Wound Description Classification: Grade 1 Foul Odor After Wound Margin: Flat and Intact Slough/Fibrino Exudate Amount: Large Exudate Type:  Purulent Exudate Color: yellow, brown, green Cleansing: No Yes Wound Bed Strausser, Carole T. ([id number]) Granulation Amount: Large (67-100%) Exposed Structure Necrotic Amount: Small (1-33%) Fascia Exposed: No Necrotic Quality: Adherent Slough Fat Layer (Subcutaneous Tissue) Exposed: Yes Tendon Exposed: No Muscle Exposed: No Joint Exposed: No Bone Exposed: No Periwound Skin Texture Texture Color No Abnormalities Noted: No No Abnormalities Noted: No Callus: No Atrophie Blanche: No Crepitus: No Cyanosis: No Excoriation: No  Ecchymosis: No Induration: No Erythema: Yes Rash: No Erythema Location: Circumferential Scarring: No Hemosiderin Staining: No Mottled: No Moisture Pallor: No No Abnormalities Noted: No Rubor: No Dry / Scaly: No Maceration: No Temperature / Pain Temperature: No Abnormality Tenderness on Palpation: Yes Wound Preparation Ulcer Cleansing: Rinsed/Irrigated with Saline Topical Anesthetic Applied: Other: Lidocaine 4%, Assessment Notes undermining non-measurable Treatment Notes Wound #1 (Right, Anterior Knee) 1. Cleansed with: Clean wound with Normal Saline 2. Anesthetic Topical Lidocaine 4% cream to wound bed prior to debridement 4. Dressing Applied: Other dressing (specify in notes) 5. Secondary Dressing Applied ABD Pad Kerlix/Conform 7. Secured with Tape Notes saline moistened kerlix packed into undermining Fetch, Siaosi T. ([id number]) Electronic Signature(s) Signed: 10/01/2016 2:56:18 PM By: Alric Quan Entered By: Alric Quan on 10/01/2016 09:00:45 Paxton, [name] ([id number]) -------------------------------------------------------------------------------- Vitals Details Patient Name: [name] Gathers T. Date of Service: 10/01/2016 8:45 AM Medical Record Number: [id number] Patient Account Number: [id number] Date of Birth/Sex: Feb 27, 1946 (71 y.o. Male) Treating RN: Ahmed Prima Primary Care Nikoli Nasser: [name] Dupre Other  Clinician: Referring Haeden Hudock: [name] Dupre Treating Micky Sheller/Extender: Frann Rider in Treatment: 6 Vital Signs Time Taken: 08:49 Temperature (F): 97.3 Height (in): 67 Pulse (bpm): 101 Weight (lbs): 357 Respiratory Rate (breaths/min): 16 Body Mass Index (BMI): 55.9 Blood Pressure (mmHg): 128/44 Reference Range: 80 - 120 mg / dl Electronic Signature(s) Signed: 10/01/2016 2:56:18 PM By: Alric Quan Entered By: Alric Quan on 10/01/2016 08:52:37

## 2016-10-07 ENCOUNTER — Telehealth: Payer: Self-pay

## 2016-10-07 DIAGNOSIS — L02415 Cutaneous abscess of right lower limb: Secondary | ICD-10-CM

## 2016-10-07 NOTE — Telephone Encounter (Signed)
Patient was seen in the ED on 09/21/16. I have tried calling him to schedule him a follow up appointment with one of our surgeons but his phone kept ringing with no end. I will try to contact him again in a couple of days

## 2016-10-08 ENCOUNTER — Inpatient Hospital Stay: Payer: Medicare Other

## 2016-10-08 ENCOUNTER — Encounter: Payer: Self-pay | Admitting: Emergency Medicine

## 2016-10-08 ENCOUNTER — Inpatient Hospital Stay
Admission: EM | Admit: 2016-10-08 | Discharge: 2016-10-12 | DRG: 872 | Disposition: A | Discharge status: Skilled Nursing Facility | Payer: Medicare Other | Attending: Specialist | Admitting: Specialist

## 2016-10-08 ENCOUNTER — Emergency Department: Payer: Medicare Other

## 2016-10-08 ENCOUNTER — Encounter: Payer: Medicare Other | Admitting: Surgery

## 2016-10-08 DIAGNOSIS — E119 Type 2 diabetes mellitus without complications: Secondary | ICD-10-CM | POA: Diagnosis present

## 2016-10-08 DIAGNOSIS — I959 Hypotension, unspecified: Secondary | ICD-10-CM | POA: Diagnosis present

## 2016-10-08 DIAGNOSIS — Z79899 Other long term (current) drug therapy: Secondary | ICD-10-CM

## 2016-10-08 DIAGNOSIS — A419 Sepsis, unspecified organism: Secondary | ICD-10-CM | POA: Diagnosis present

## 2016-10-08 DIAGNOSIS — D649 Anemia, unspecified: Secondary | ICD-10-CM | POA: Diagnosis present

## 2016-10-08 DIAGNOSIS — N179 Acute kidney failure, unspecified: Secondary | ICD-10-CM | POA: Diagnosis present

## 2016-10-08 DIAGNOSIS — I110 Hypertensive heart disease with heart failure: Secondary | ICD-10-CM | POA: Diagnosis present

## 2016-10-08 DIAGNOSIS — F329 Major depressive disorder, single episode, unspecified: Secondary | ICD-10-CM | POA: Diagnosis present

## 2016-10-08 DIAGNOSIS — Z1623 Resistance to quinolones and fluoroquinolones: Secondary | ICD-10-CM | POA: Diagnosis present

## 2016-10-08 DIAGNOSIS — Z8673 Personal history of transient ischemic attack (TIA), and cerebral infarction without residual deficits: Secondary | ICD-10-CM

## 2016-10-08 DIAGNOSIS — Z7901 Long term (current) use of anticoagulants: Secondary | ICD-10-CM

## 2016-10-08 DIAGNOSIS — E871 Hypo-osmolality and hyponatremia: Secondary | ICD-10-CM | POA: Diagnosis present

## 2016-10-08 DIAGNOSIS — Z7984 Long term (current) use of oral hypoglycemic drugs: Secondary | ICD-10-CM | POA: Diagnosis not present

## 2016-10-08 DIAGNOSIS — L02415 Cutaneous abscess of right lower limb: Secondary | ICD-10-CM | POA: Diagnosis present

## 2016-10-08 DIAGNOSIS — R6520 Severe sepsis without septic shock: Secondary | ICD-10-CM | POA: Diagnosis present

## 2016-10-08 DIAGNOSIS — Z6841 Body Mass Index (BMI) 40.0 and over, adult: Secondary | ICD-10-CM

## 2016-10-08 DIAGNOSIS — R531 Weakness: Secondary | ICD-10-CM

## 2016-10-08 DIAGNOSIS — Z7951 Long term (current) use of inhaled steroids: Secondary | ICD-10-CM

## 2016-10-08 DIAGNOSIS — Z833 Family history of diabetes mellitus: Secondary | ICD-10-CM

## 2016-10-08 DIAGNOSIS — J449 Chronic obstructive pulmonary disease, unspecified: Secondary | ICD-10-CM | POA: Diagnosis present

## 2016-10-08 DIAGNOSIS — I5032 Chronic diastolic (congestive) heart failure: Secondary | ICD-10-CM | POA: Diagnosis present

## 2016-10-08 DIAGNOSIS — E039 Hypothyroidism, unspecified: Secondary | ICD-10-CM | POA: Diagnosis present

## 2016-10-08 DIAGNOSIS — I4581 Long QT syndrome: Secondary | ICD-10-CM | POA: Diagnosis present

## 2016-10-08 DIAGNOSIS — E785 Hyperlipidemia, unspecified: Secondary | ICD-10-CM | POA: Diagnosis present

## 2016-10-08 DIAGNOSIS — E860 Dehydration: Secondary | ICD-10-CM | POA: Diagnosis present

## 2016-10-08 DIAGNOSIS — J9610 Chronic respiratory failure, unspecified whether with hypoxia or hypercapnia: Secondary | ICD-10-CM | POA: Diagnosis present

## 2016-10-08 DIAGNOSIS — Z8711 Personal history of peptic ulcer disease: Secondary | ICD-10-CM

## 2016-10-08 DIAGNOSIS — Z85828 Personal history of other malignant neoplasm of skin: Secondary | ICD-10-CM

## 2016-10-08 DIAGNOSIS — Z86711 Personal history of pulmonary embolism: Secondary | ICD-10-CM

## 2016-10-08 DIAGNOSIS — Z87891 Personal history of nicotine dependence: Secondary | ICD-10-CM

## 2016-10-08 DIAGNOSIS — L0291 Cutaneous abscess, unspecified: Secondary | ICD-10-CM

## 2016-10-08 DIAGNOSIS — N289 Disorder of kidney and ureter, unspecified: Secondary | ICD-10-CM

## 2016-10-08 DIAGNOSIS — Z8249 Family history of ischemic heart disease and other diseases of the circulatory system: Secondary | ICD-10-CM

## 2016-10-08 DIAGNOSIS — G4733 Obstructive sleep apnea (adult) (pediatric): Secondary | ICD-10-CM | POA: Diagnosis present

## 2016-10-08 DIAGNOSIS — E876 Hypokalemia: Secondary | ICD-10-CM

## 2016-10-08 DIAGNOSIS — Z951 Presence of aortocoronary bypass graft: Secondary | ICD-10-CM

## 2016-10-08 DIAGNOSIS — Z880 Allergy status to penicillin: Secondary | ICD-10-CM

## 2016-10-08 HISTORY — DX: Cutaneous abscess, unspecified: L02.91

## 2016-10-08 LAB — CBC WITH DIFFERENTIAL/PLATELET
BASOS ABS: 0 10*3/uL (ref 0–0.1)
Basophils Relative: 0 %
EOS PCT: 2 %
Eosinophils Absolute: 0.2 10*3/uL (ref 0–0.7)
HEMATOCRIT: 36.1 % — AB (ref 40.0–52.0)
HEMOGLOBIN: 11.3 g/dL — AB (ref 13.0–18.0)
LYMPHS ABS: 1 10*3/uL (ref 1.0–3.6)
LYMPHS PCT: 10 %
MCH: 23.8 pg — AB (ref 26.0–34.0)
MCHC: 31.3 g/dL — ABNORMAL LOW (ref 32.0–36.0)
MCV: 76.2 fL — AB (ref 80.0–100.0)
Monocytes Absolute: 0.9 10*3/uL (ref 0.2–1.0)
Monocytes Relative: 9 %
NEUTROS PCT: 79 %
Neutro Abs: 7.5 10*3/uL — ABNORMAL HIGH (ref 1.4–6.5)
Platelets: 449 10*3/uL — ABNORMAL HIGH (ref 150–440)
RBC: 4.74 MIL/uL (ref 4.40–5.90)
RDW: 18.1 % — ABNORMAL HIGH (ref 11.5–14.5)
WBC: 9.6 10*3/uL (ref 3.8–10.6)

## 2016-10-08 LAB — COMPREHENSIVE METABOLIC PANEL
ALBUMIN: 3.5 g/dL (ref 3.5–5.0)
ALK PHOS: 72 U/L (ref 38–126)
ALT: 21 U/L (ref 17–63)
ANION GAP: 13 (ref 5–15)
AST: 29 U/L (ref 15–41)
BUN: 51 mg/dL — ABNORMAL HIGH (ref 6–20)
CALCIUM: 9.5 mg/dL (ref 8.9–10.3)
CHLORIDE: 79 mmol/L — AB (ref 101–111)
CO2: 34 mmol/L — AB (ref 22–32)
Creatinine, Ser: 2.32 mg/dL — ABNORMAL HIGH (ref 0.61–1.24)
GFR calc Af Amer: 31 mL/min — ABNORMAL LOW (ref >60)
GFR calc non Af Amer: 27 mL/min — ABNORMAL LOW (ref >60)
GLUCOSE: 196 mg/dL — AB (ref 65–99)
Potassium: 2.6 mmol/L — CL (ref 3.5–5.1)
Sodium: 126 mmol/L — ABNORMAL LOW (ref 135–145)
Total Bilirubin: 0.7 mg/dL (ref 0.3–1.2)
Total Protein: 8 g/dL (ref 6.5–8.1)

## 2016-10-08 LAB — LACTIC ACID, PLASMA
LACTIC ACID, VENOUS: 3.4 mmol/L — AB (ref 0.5–1.9)
Lactic Acid, Venous: 3 mmol/L (ref 0.5–1.9)

## 2016-10-08 LAB — URINALYSIS, COMPLETE (UACMP) WITH MICROSCOPIC
BACTERIA UA: NONE SEEN
Bilirubin Urine: NEGATIVE
GLUCOSE, UA: NEGATIVE mg/dL
HGB URINE DIPSTICK: NEGATIVE
KETONES UR: NEGATIVE mg/dL
LEUKOCYTES UA: NEGATIVE
NITRITE: NEGATIVE
PH: 6 (ref 5.0–8.0)
PROTEIN: NEGATIVE mg/dL
Specific Gravity, Urine: 1.01 (ref 1.005–1.030)

## 2016-10-08 LAB — GLUCOSE, CAPILLARY: GLUCOSE-CAPILLARY: 229 mg/dL — AB (ref 65–99)

## 2016-10-08 LAB — PROTIME-INR
INR: 1.12
Prothrombin Time: 14.5 seconds (ref 11.4–15.2)

## 2016-10-08 MED ORDER — BUPROPION HCL ER (SR) 100 MG PO TB12
100.0000 mg | ORAL_TABLET | Freq: Every day | ORAL | Status: DC
  Administered 2016-10-09 – 2016-10-12 (×4): 100 mg via ORAL
  Filled 2016-10-08 (×4): qty 1

## 2016-10-08 MED ORDER — ZIPRASIDONE HCL 80 MG PO CAPS: 80.0000 mg | ORAL_CAPSULE | Freq: Two times a day (BID) | ORAL | Status: DC

## 2016-10-08 MED ORDER — ASPIRIN 81 MG PO CHEW
81.0000 mg | CHEWABLE_TABLET | Freq: Every day | ORAL | Status: DC
  Administered 2016-10-09 – 2016-10-12 (×4): 81 mg via ORAL
  Filled 2016-10-08 (×4): qty 1

## 2016-10-08 MED ORDER — SODIUM CHLORIDE 0.9 % IV BOLUS (SEPSIS)
1000.0000 mL | Freq: Once | INTRAVENOUS | Status: AC
  Administered 2016-10-08: 1000 mL via INTRAVENOUS

## 2016-10-08 MED ORDER — MECLIZINE HCL 25 MG PO TABS: 25.0000 mg | ORAL_TABLET | Freq: Two times a day (BID) | ORAL | Status: DC | PRN

## 2016-10-08 MED ORDER — SODIUM CHLORIDE 0.9 % IV SOLN: 1.0000 g | Freq: Three times a day (TID) | INTRAVENOUS | Status: DC

## 2016-10-08 MED ORDER — SODIUM CHLORIDE 0.9 % IV SOLN
1.0000 g | Freq: Two times a day (BID) | INTRAVENOUS | Status: DC
  Administered 2016-10-08 – 2016-10-09 (×2): 1 g via INTRAVENOUS
  Filled 2016-10-08 (×3): qty 1

## 2016-10-08 MED ORDER — MONTELUKAST SODIUM 10 MG PO TABS
10.0000 mg | ORAL_TABLET | Freq: Every day | ORAL | Status: DC
  Administered 2016-10-08 – 2016-10-11 (×4): 10 mg via ORAL
  Filled 2016-10-08 (×4): qty 1

## 2016-10-08 MED ORDER — DEXTROSE 5 % IV SOLN
2.0000 g | Freq: Once | INTRAVENOUS | Status: AC
  Administered 2016-10-08: 2 g via INTRAVENOUS
  Filled 2016-10-08 (×2): qty 2

## 2016-10-08 MED ORDER — GABAPENTIN 400 MG PO CAPS
400.0000 mg | ORAL_CAPSULE | Freq: Every day | ORAL | Status: DC
  Administered 2016-10-09 – 2016-10-11 (×4): 400 mg via ORAL
  Filled 2016-10-08 (×4): qty 1

## 2016-10-08 MED ORDER — RIVAROXABAN 20 MG PO TABS
20.0000 mg | ORAL_TABLET | Freq: Every day | ORAL | Status: DC
  Administered 2016-10-09 – 2016-10-12 (×4): 20 mg via ORAL
  Filled 2016-10-08 (×4): qty 1

## 2016-10-08 MED ORDER — POTASSIUM CHLORIDE CRYS ER 20 MEQ PO TBCR
20.0000 meq | EXTENDED_RELEASE_TABLET | Freq: Two times a day (BID) | ORAL | Status: DC
  Administered 2016-10-08 – 2016-10-12 (×8): 20 meq via ORAL
  Filled 2016-10-08 (×8): qty 1

## 2016-10-08 MED ORDER — SENNOSIDES-DOCUSATE SODIUM 8.6-50 MG PO TABS
2.0000 | ORAL_TABLET | Freq: Every day | ORAL | Status: DC
  Administered 2016-10-10 – 2016-10-12 (×3): 2 via ORAL
  Filled 2016-10-08 (×3): qty 2

## 2016-10-08 MED ORDER — IPRATROPIUM-ALBUTEROL 0.5-2.5 (3) MG/3ML IN SOLN
3.0000 mL | RESPIRATORY_TRACT | Status: DC | PRN
  Administered 2016-10-08 – 2016-10-11 (×3): 3 mL via RESPIRATORY_TRACT
  Filled 2016-10-08 (×3): qty 3

## 2016-10-08 MED ORDER — DOXEPIN HCL 10 MG PO CAPS
10.0000 mg | ORAL_CAPSULE | Freq: Every day | ORAL | Status: DC
  Administered 2016-10-08 – 2016-10-11 (×4): 10 mg via ORAL
  Filled 2016-10-08 (×5): qty 1

## 2016-10-08 MED ORDER — FLUTICASONE PROPIONATE 50 MCG/ACT NA SUSP
1.0000 | Freq: Every day | NASAL | Status: DC
  Administered 2016-10-09 – 2016-10-12 (×4): 1 via NASAL
  Filled 2016-10-08: qty 16

## 2016-10-08 MED ORDER — SODIUM CHLORIDE 0.9 % IV BOLUS (SEPSIS): 1000.0000 mL | Freq: Once | INTRAVENOUS | Status: DC

## 2016-10-08 MED ORDER — SODIUM CHLORIDE 0.9 % IV SOLN
1750.0000 mg | INTRAVENOUS | Status: DC
  Administered 2016-10-09: 1750 mg via INTRAVENOUS
  Filled 2016-10-08 (×2): qty 1750

## 2016-10-08 MED ORDER — POTASSIUM CHLORIDE 10 MEQ/100ML IV SOLN
10.0000 meq | INTRAVENOUS | Status: DC | PRN
  Administered 2016-10-08: 10 meq via INTRAVENOUS
  Filled 2016-10-08: qty 100

## 2016-10-08 MED ORDER — DOCUSATE SODIUM 100 MG PO CAPS: 100.0000 mg | ORAL_CAPSULE | Freq: Two times a day (BID) | ORAL | Status: DC | PRN

## 2016-10-08 MED ORDER — VANCOMYCIN HCL IN DEXTROSE 1-5 GM/200ML-% IV SOLN
1000.0000 mg | Freq: Once | INTRAVENOUS | Status: AC
  Administered 2016-10-08: 1000 mg via INTRAVENOUS
  Filled 2016-10-08: qty 200

## 2016-10-08 MED ORDER — LEVOTHYROXINE SODIUM 100 MCG PO TABS
100.0000 ug | ORAL_TABLET | Freq: Every day | ORAL | Status: DC
  Administered 2016-10-09 – 2016-10-12 (×4): 100 ug via ORAL
  Filled 2016-10-08 (×4): qty 1

## 2016-10-08 MED ORDER — LEVOFLOXACIN IN D5W 750 MG/150ML IV SOLN
750.0000 mg | Freq: Once | INTRAVENOUS | Status: AC
  Administered 2016-10-08: 750 mg via INTRAVENOUS
  Filled 2016-10-08: qty 150

## 2016-10-08 MED ORDER — ZIPRASIDONE HCL 80 MG PO CAPS
80.0000 mg | ORAL_CAPSULE | Freq: Two times a day (BID) | ORAL | Status: DC
  Administered 2016-10-09 – 2016-10-12 (×8): 80 mg via ORAL
  Filled 2016-10-08 (×10): qty 1

## 2016-10-08 MED ORDER — MOMETASONE FURO-FORMOTEROL FUM 100-5 MCG/ACT IN AERO
2.0000 | INHALATION_SPRAY | Freq: Two times a day (BID) | RESPIRATORY_TRACT | Status: DC
  Administered 2016-10-08 – 2016-10-12 (×8): 2 via RESPIRATORY_TRACT
  Filled 2016-10-08: qty 8.8

## 2016-10-08 MED ORDER — POTASSIUM CHLORIDE IN NACL 40-0.9 MEQ/L-% IV SOLN
INTRAVENOUS | Status: DC
  Administered 2016-10-08 – 2016-10-09 (×2): 100 mL/h via INTRAVENOUS
  Filled 2016-10-08 (×3): qty 1000

## 2016-10-08 MED ORDER — ALBUTEROL SULFATE (2.5 MG/3ML) 0.083% IN NEBU: 2.5000 mg | INHALATION_SOLUTION | RESPIRATORY_TRACT | Status: DC | PRN

## 2016-10-08 MED ORDER — PANTOPRAZOLE SODIUM 40 MG PO TBEC
40.0000 mg | DELAYED_RELEASE_TABLET | Freq: Every day | ORAL | Status: DC
  Administered 2016-10-09 – 2016-10-12 (×4): 40 mg via ORAL
  Filled 2016-10-08 (×4): qty 1

## 2016-10-08 NOTE — ED Notes (Signed)
Date and time results received: 10/08/16 2108  (use smartphrase ".now" to insert current time)  Test: Critical Value: 3.4  Name of Provider Notified: lactic  Orders Received? Or Actions Taken?:

## 2016-10-08 NOTE — ED Notes (Addendum)
Patient off floor to CT.

## 2016-10-08 NOTE — ED Notes (Signed)
When patient was first placed in treatment room this RN took patient's blood pressure in his right upper arm and blood pressure was 64/38.  When this RN checked blood pressure in patient's wrist per EMS suggestion his blood pressure was 103/69.  MD aware.

## 2016-10-08 NOTE — H&P (Signed)
[place] [facility] [place] NAME: [name]    MR#:  [id number]  DATE OF BIRTH:  01/22/1946  DATE OF ADMISSION:  10/08/2016  PRIMARY CARE PHYSICIAN: Jodi Marble, MD   REQUESTING/REFERRING PHYSICIAN: Paduchowski  CHIEF COMPLAINT:   Chief Complaint  Patient presents with  . Weakness  . Hypotension    HISTORY OF PRESENT ILLNESS: [name]  is a 71 y.o. male with a known history of CHF, DM, Htn, PE, CABG, Skin ca, OSA- had infection and abscess on right knee 2 weeks ago- I & D done and sent home on levaquin. As per the culture results- he had MRSA and proteus- resistant to cipro. He was at [facility], cont to get worse, had fever and chills, right knee pain, went to wound care clinic today- and sent to ER. Noted to have worsening renal func, hypokalemia, also had hypotension and some lethargy after attempted drainage by ER physician. 3-4 ltr bolus given by ER.  PAST MEDICAL HISTORY:   Past Medical History:  Diagnosis Date  . Arthritis   . Asthma   . CHF (congestive heart failure) ([place])   . Diabetes ([place])   . Heart trouble   . History of stomach ulcers   . Hypertension   . 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 OSA (obstructive sleep apnea) 08/23/2016  . Pulmonary embolism ([place]) 08/23/2016  . S/P CABG x 2   . Skin cancer   . Stroke [facility])     PAST SURGICAL HISTORY: Past Surgical History:  Procedure Laterality Date  . CARDIAC SURGERY    . CHOLECYSTECTOMY    . GALLBLADDER SURGERY    . HEART BYPASS    . HERNIA REPAIR    . IR ANGIOGRAM PULMONARY BILATERAL SELECTIVE  08/23/2016  . IR ANGIOGRAM SELECTIVE EACH ADDITIONAL VESSEL  08/23/2016  . IR ANGIOGRAM SELECTIVE EACH ADDITIONAL VESSEL  08/23/2016  . IR INFUSION THROMBOL ARTERIAL INITIAL (MS)  08/23/2016  . IR INFUSION THROMBOL ARTERIAL INITIAL (MS)  08/23/2016  . IR THROMB F/U EVAL ART/VEN FINAL DAY (MS)  08/24/2016  . IR US GUIDE VASC ACCESS RIGHT  08/23/2016  . KIDNEY SURGERY      SOCIAL HISTORY:  Social  History  Substance Use Topics  . Smoking status: Former Smoker    Quit date: 03/15/1963  . Smokeless tobacco: Never Used  . Alcohol use No    FAMILY HISTORY:  Family History  Problem Relation Age of Onset  . Diabetes Mother   . Heart attack Father   . Diabetes Brother   . Heart attack Brother     DRUG ALLERGIES:  Allergies  Allergen Reactions  . Cephalosporins Other (See Comments)    Patient doesn't recall the reaction  . Dilaudid [Hydromorphone Hcl] Other (See Comments)    "CANNOT HEAR, CANNOT THINK"    . Hydrocodone-Acetaminophen Nausea And Vomiting  . Morphine And Related Other (See Comments)    Patient was told by a doctor that this "would kill" him  . Tetracycline Other (See Comments)    Patient was told by a doctor that this "would kill" him  . Tetracyclines & Related Other (See Comments)    Patient doesn't recall the reaction  . Penicillins Rash    Has patient had a PCN reaction causing immediate rash, facial/tongue/throat swelling, SOB or lightheadedness with hypotension: Yes Has patient had a PCN reaction causing severe rash involving mucus membranes or skin necrosis: No Has patient had a PCN reaction that required  hospitalization No Has patient had a PCN reaction occurring within the last 10 years: No If all of the above answers are "NO", then may proceed with Cephalosporin use.   . Sulfa Antibiotics Rash    REVIEW OF SYSTEMS:   CONSTITUTIONAL: positive for fever, fatigue or weakness.  EYES: No blurred or double vision.  EARS, NOSE, AND THROAT: No tinnitus or ear pain.  RESPIRATORY: No cough, shortness of breath, wheezing or hemoptysis.  CARDIOVASCULAR: No chest pain, orthopnea, edema.  GASTROINTESTINAL: No nausea, vomiting, diarrhea or abdominal pain.  GENITOURINARY: No dysuria, hematuria.  ENDOCRINE: No polyuria, nocturia,  HEMATOLOGY: No anemia, easy bruising or bleeding SKIN: No rash or lesion. MUSCULOSKELETAL: right knee joint pain or arthritis.    NEUROLOGIC: No tingling, numbness, weakness.  PSYCHIATRY: No anxiety or depression.   MEDICATIONS AT HOME:  Prior to Admission medications   Medication Sig Start Date End Date Taking? Authorizing Provider  albuterol (PROAIR HFA) 108 (90 Base) MCG/ACT inhaler Inhale 2 puffs into the lungs every 4 (four) hours as needed for wheezing or shortness of breath.   Yes [provider]  amLODipine (NORVASC) 10 MG tablet Take 10 mg by mouth daily.  05/31/13  Yes [provider]  aspirin 81 MG chewable tablet Chew 81 mg by mouth daily.   Yes [provider]  buPROPion (WELLBUTRIN SR) 100 MG 12 hr tablet Take 100 mg by mouth daily.  05/31/13  Yes [provider]  doxepin (SINEQUAN) 10 MG capsule Take 10 mg by mouth at bedtime.   Yes [provider]  fluticasone (FLONASE) 50 MCG/ACT nasal spray Place 1 spray into both nostrils daily.   Yes [provider]  Fluticasone-Salmeterol (ADVAIR) 100-50 MCG/DOSE AEPB Inhale 1 puff into the lungs 2 (two) times daily.   Yes [provider]  furosemide (LASIX) 40 MG tablet Take 40 mg by mouth 2 (two) times daily.   Yes [provider]  ipratropium-albuterol (DUONEB) 0.5-2.5 (3) MG/3ML SOLN Inhale 3 mLs into the lungs every 4 (four) hours as needed (shortness of breath/wheezing).  05/01/16  Yes [provider]  iron polysaccharides (NIFEREX) 150 MG capsule Take 150 mg by mouth daily.   Yes [provider]  meclizine (ANTIVERT) 25 MG tablet Take 25 mg by mouth 2 (two) times daily as needed for dizziness.  05/31/13  Yes [provider]  metolazone (ZAROXOLYN) 5 MG tablet Take 5 mg by mouth daily.   Yes [provider]  metoprolol (LOPRESSOR) 50 MG tablet Take 50 mg by mouth 2 (two) times daily.  05/31/13  Yes [provider]  montelukast (SINGULAIR) 10 MG tablet Take 1 tablet by mouth at bedtime.    Yes [provider]  pantoprazole (PROTONIX) 40 MG tablet  Take 40 mg by mouth daily.    Yes [provider]  rivaroxaban (XARELTO) 20 MG TABS tablet Take 1 tablet (20 mg total) by mouth daily with supper. 09/23/16  Yes Mody, Sital, MD  senna-docusate (SENOKOT-S) 8.6-50 MG tablet Take 2 tablets by mouth daily.   Yes [provider]  Suvorexant (BELSOMRA) 10 MG TABS Take 10 mg by mouth at bedtime.   Yes [provider]  ziprasidone (GEODON) 80 MG capsule Take 80 mg by mouth 2 (two) times daily with a meal.   Yes [provider]  levothyroxine (SYNTHROID, LEVOTHROID) 100 MCG tablet Take 100 mcg by mouth daily before breakfast.  05/31/13   [provider]      PHYSICAL EXAMINATION:  VITAL SIGNS: Blood pressure 98/61, pulse 73, temperature 98.2 F (36.8 C), temperature source Oral, resp. rate 20, height 5\' 7"  (1.702 m), weight (!) 155.2 kg (342 lb 1.6 oz), SpO2 96 %.  GENERAL:  71 y.o.-year-old patient lying in the bed with no acute distress.  EYES: Pupils equal, round, reactive to light and accommodation. No scleral icterus. Extraocular muscles intact.  HEENT: Head atraumatic, normocephalic. Oropharynx and nasopharynx clear.  NECK:  Supple, no jugular venous distention. No thyroid enlargement, no tenderness.  LUNGS: Normal breath sounds bilaterally, no wheezing, rales,rhonchi or crepitation. No use of accessory muscles of respiration.  CARDIOVASCULAR: S1, S2 normal. No murmurs, rubs, or gallops.  ABDOMEN: Soft, nontender, nondistended. Bowel sounds present. No organomegaly or mass.  EXTREMITIES: No pedal edema, cyanosis, or clubbing. Right knee swelling and painful. diatal pulses on both LL are palpable. NEUROLOGIC: Cranial nerves II through XII are intact. Muscle strength 4/5 in all extremities. Sensation intact. Gait not checked.  PSYCHIATRIC: The patient is alert and oriented x 3.  SKIN: No obvious rash, lesion, or ulcer.   LABORATORY PANEL:   CBC  Recent Labs Lab 10/08/16 1629  WBC 9.6  HGB 11.3*   HCT 36.1*  PLT 449*  MCV 76.2*  MCH 23.8*  MCHC 31.3*  RDW 18.1*  LYMPHSABS 1.0  MONOABS 0.9  EOSABS 0.2  BASOSABS 0.0   ------------------------------------------------------------------------------------------------------------------  Chemistries   Recent Labs Lab 10/08/16 1629  NA 126*  K 2.6*  CL 79*  CO2 34*  GLUCOSE 196*  BUN 51*  CREATININE 2.32*  CALCIUM 9.5  AST 29  ALT 21  ALKPHOS 72  BILITOT 0.7   ------------------------------------------------------------------------------------------------------------------ estimated creatinine clearance is 42 mL/min (A) (by C-G formula based on SCr of 2.32 mg/dL (H)). ------------------------------------------------------------------------------------------------------------------ No results for input(s): TSH, T4TOTAL, T3FREE, THYROIDAB in the last 72 hours.  Invalid input(s): FREET3   Coagulation profile  Recent Labs Lab 10/08/16 1629  INR 1.12   ------------------------------------------------------------------------------------------------------------------- No results for input(s): DDIMER in the last 72 hours. -------------------------------------------------------------------------------------------------------------------  Cardiac Enzymes No results for input(s): CKMB, TROPONINI, MYOGLOBIN in the last 168 hours.  Invalid input(s): CK ------------------------------------------------------------------------------------------------------------------ Invalid input(s): POCBNP  ---------------------------------------------------------------------------------------------------------------  Urinalysis    Component Value Date/Time   COLORURINE YELLOW (A) 10/08/2016 1652   APPEARANCEUR CLEAR (A) 10/08/2016 1652   APPEARANCEUR Hazy 08/20/2014 1116   LABSPEC 1.010 10/08/2016 1652   LABSPEC 1.016 08/20/2014 1116   PHURINE 6.0 10/08/2016 1652   GLUCOSEU NEGATIVE 10/08/2016 1652   GLUCOSEU Negative  08/20/2014 1116   HGBUR NEGATIVE 10/08/2016 1652   BILIRUBINUR NEGATIVE 10/08/2016 1652   BILIRUBINUR Negative 08/20/2014 1116   KETONESUR NEGATIVE 10/08/2016 1652   PROTEINUR NEGATIVE 10/08/2016 1652   NITRITE NEGATIVE 10/08/2016 1652   LEUKOCYTESUR NEGATIVE 10/08/2016 1652   LEUKOCYTESUR 3+ 08/20/2014 1116     RADIOLOGY: Dg Chest Port 1 View  Result Date: 10/08/2016 CLINICAL DATA:  Hypotensive, cough EXAM: PORTABLE CHEST 1 VIEW COMPARISON:  08/24/2016 FINDINGS: Post sternotomy changes. Fracture through the first 3 sternal wires as before. Low lung volumes with elevated right diaphragm and right basilar atelectasis. No large effusion. Cardiomegaly. Subsegmental atelectasis left base. IMPRESSION: Elevated right diaphragm with subsegmental atelectasis at the bilateral bases. Cardiomegaly without edema or definitive infiltrate. Electronically Signed   By: Donavan Foil M.D.   On: 10/08/2016 17:03    EKG: Orders placed or performed during the hospital encounter of 10/08/16  . ED EKG 12-Lead  . ED EKG 12-Lead    IMPRESSION AND PLAN:  *  Sepsis   Right knee abscess   MRSA infection    IV vanc and meropenem to cover cipro resistant proteus ( allergic to ceftriaxone and penicillin )   Blood cx sent    Will do Knee CT to check extent and get surgical consult.   ID consult.  * Ac renal failure   Hold lasix, hold BP meds   IV fluids.  * Hypokalemia    Replace with IV fluids, oral replace.  * PE-   On xarelto, cont for now.  * Hypothyroidism   Cont levothyroxine  * COPD   No exacerbation.   All the records are reviewed and case discussed with ED provider. Management plans discussed with the patient, family and they are in agreement.  CODE STATUS: full code. Code Status History    Date Active Date Inactive Code Status Order ID Comments User Context   09/22/2016  2:04 AM 09/23/2016  2:47 PM Full Code [id number]  Saundra Shelling, MD ED   08/23/2016 10:40 AM 08/27/2016 11:21 PM Full  Code [id number]  Brand Males, MD Inpatient   07/14/2016  8:10 AM 07/14/2016  7:07 PM Full Code [id number]  Saundra Shelling, MD ED   04/27/2016  2:28 AM 04/28/2016  5:18 PM Full Code [id number]  Lance Coon, MD Inpatient   02/16/2016  8:34 PM 02/17/2016  3:40 PM Full Code [id number]  Polo, Ubaldo Glassing, DO ED    Advance Directive Documentation     Most Recent Value  Type of Advance Directive  Living will  Pre-existing out of facility DNR order (yellow form or pink MOST form)  -  "MOST" Form in Place?  -       TOTAL TIME TAKING CARE OF THIS PATIENT: 50 minutes.    Vaughan Basta M.D on 10/08/2016   Between 7am to 6pm - Pager - [phone]  After 6pm go to [email or web address] - password [email or web address] [facility]  Office  [phone]  CC: Primary care physician; Jodi Marble, MD   Note: This dictation was prepared with Dragon dictation along with smaller phrase technology. Any transcriptional errors that result from this process are unintentional.

## 2016-10-08 NOTE — ED Notes (Signed)
Assisted pt with changing depends.

## 2016-10-08 NOTE — Progress Notes (Signed)
Pharmacy Antibiotic Note  [name] is a 71 y.o. male admitted on 10/08/2016 with infection and abscess on right knee tha continues to get worse. Patient had  I & D done 2 weeks ago and sent home on levaquin.   Pharmacy has been consulted for meropenem and vancomycin dosing. Patient received vancomycin 1g IV , Aztreonam 2g, and levofloxacin 750 mg IV x 1 dose in ED.   Had previous cultures positive for  MRSA and proteus- resistant to cipro  Plan: Ke: 0.038    Vd: 71   T1/2: 18.4  Will start patient on Vancomycin 1750mg  IV every 24 hours, with 6 hour stack dosing. Calculated trough at Css is 18. Trough level prior to 4th dose. Will monitor renal function and adjust dose as needed.  Will start meropenem 1g IV every 12 hours.    Height: 5\' 7"  (170.2 cm) Weight: (!) 342 lb 1.6 oz (155.2 kg) IBW/kg (Calculated) : 66.1  Temp (24hrs), Avg:98.2 F (36.8 C), Min:98.2 F (36.8 C), Max:98.2 F (36.8 C)   Recent Labs Lab 10/08/16 1629 10/08/16 1630  WBC 9.6  --   CREATININE 2.32*  --   LATICACIDVEN  --  3.0*    Estimated Creatinine Clearance: 42 mL/min (A) (by C-G formula based on SCr of 2.32 mg/dL (H)).    Allergies  Allergen Reactions  . Cephalosporins Other (See Comments)    Patient doesn't recall the reaction  . Dilaudid [Hydromorphone Hcl] Other (See Comments)    "CANNOT HEAR, CANNOT THINK"    . Hydrocodone-Acetaminophen Nausea And Vomiting  . Morphine And Related Other (See Comments)    Patient was told by a doctor that this "would kill" him  . Tetracycline Other (See Comments)    Patient was told by a doctor that this "would kill" him  . Tetracyclines & Related Other (See Comments)    Patient doesn't recall the reaction  . Penicillins Rash    Has patient had a PCN reaction causing immediate rash, facial/tongue/throat swelling, SOB or lightheadedness with hypotension: Yes Has patient had a PCN reaction causing severe rash involving mucus membranes or skin necrosis:  No Has patient had a PCN reaction that required hospitalization No Has patient had a PCN reaction occurring within the last 10 years: No If all of the above answers are "NO", then may proceed with Cephalosporin use.   . Sulfa Antibiotics Rash    Antimicrobials this admission: 5/17 vancomycin>>  5/17 meropenem >>   Dose adjustments this admission:  Microbiology results: 5/17 BCx: sent 5/17 UCx: sent   Thank you for allowing pharmacy to be a part of this patient's care.  [name] Dupre, PharmD, BCPS Clinical Pharmacist 10/08/2016 8:19 PM

## 2016-10-08 NOTE — ED Triage Notes (Signed)
Patient presents to the ED via EMS from the wound center.  Patient is a resident at [facility].  Patient was being treated at the wound center for a tunneling wound to his right knee.  Patient became weak and diaphoretic at the wound center, heart rate dropped to 25 and systolic blood pressure was in the 50s per staff.  Per EMS patient has been in NS rhythm.  Patient appears tired and lethargic at this time.  Will hold a conversation but quickly falls asleep with snoring respirations.

## 2016-10-08 NOTE — ED Notes (Signed)
MD at bedside suctioning wound

## 2016-10-08 NOTE — ED Provider Notes (Signed)
[facility] Emergency Department Provider Note  Time seen: 4:38 PM  I have reviewed the triage vital signs and the nursing notes.   HISTORY  Chief Complaint Weakness and Hypotension    HPI [name] is a 71 y.o. male with a past medical history of obesity, CHF, diabetes, hypertension, PE, CVA, who presents to the emergency department from wound clinic for hypotension and lethargy. According to report patient is currently being treated at wound clinic for a right knee wound/abscess. Patient states over the past 2 days he has had subjective fever at home. Today at wound clinic a was noted to have blood pressures in the 50s and to be very weak and lethargic, patient was sent to the ER for further evaluation. Patient is alert and oriented 4 currently. Patient states over the past 1-2 days he has felt very weak and fatigued. Denies any focal deficits. Denies headache. States he has had a cough over the past few days but denies any congestion. Denies abdominal pain. States nausea but denies vomiting or diarrhea. Denies dysuria.  Past Medical History:  Diagnosis Date  . Arthritis   . Asthma   . CHF (congestive heart failure) ([place])   . Diabetes ([place])   . Heart trouble   . History of stomach ulcers   . Hypertension   . 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 OSA (obstructive sleep apnea) 08/23/2016  . Pulmonary embolism ([place]) 08/23/2016  . S/P CABG x 2   . Skin cancer   . Stroke [facility])     Patient Active Problem List   Diagnosis Date Noted  . Abscess of right leg excluding foot   . Cellulitis 09/21/2016  . AKI (acute kidney injury) ([place]) 08/27/2016  . Demand ischemia ([place]) 08/27/2016  . Elevated troponin 08/27/2016  . 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 DVT (deep venous thrombosis) ([place]) 08/27/2016  . Pressure injury of skin 08/24/2016  . Pulmonary embolism ([place]) 08/23/2016  . SIRS (systemic inflammatory response syndrome) ([place]) 08/23/2016  . Acute on chronic renal failure ([place])  08/23/2016  . Acute on chronic respiratory failure ([place]) 08/23/2016  . Lactic acidosis 08/23/2016  . Electrolyte imbalance 08/23/2016  . Current use of proton pump inhibitor 08/23/2016  . OSA (obstructive sleep apnea) 08/23/2016  . History of bipolar disorder 08/23/2016  . History of gastric ulcer 08/23/2016  . History of asthma 08/23/2016  . History of chronic pain 08/23/2016  . Acute pulmonary embolism ([place]) 08/23/2016  . Chest pain 07/14/2016  . UTI (urinary tract infection) 04/27/2016  . CAD (coronary artery disease) 04/27/2016  . Diabetes ([place]) 04/27/2016  . HTN (hypertension) 04/27/2016  . Chronic diastolic CHF (congestive heart failure) ([place]) 04/27/2016  . CKD (chronic kidney disease), stage III 04/27/2016  . Chest pain, rule out acute myocardial infarction 02/16/2016    Past Surgical History:  Procedure Laterality Date  . CARDIAC SURGERY    . CHOLECYSTECTOMY    . GALLBLADDER SURGERY    . HEART BYPASS    . HERNIA REPAIR    . IR ANGIOGRAM PULMONARY BILATERAL SELECTIVE  08/23/2016  . IR ANGIOGRAM SELECTIVE EACH ADDITIONAL VESSEL  08/23/2016  . IR ANGIOGRAM SELECTIVE EACH ADDITIONAL VESSEL  08/23/2016  . IR INFUSION THROMBOL ARTERIAL INITIAL (MS)  08/23/2016  . IR INFUSION THROMBOL ARTERIAL INITIAL (MS)  08/23/2016  . IR THROMB F/U EVAL ART/VEN FINAL DAY (MS)  08/24/2016  . IR US GUIDE VASC ACCESS RIGHT  08/23/2016  . KIDNEY SURGERY      Prior  to Admission medications   Medication Sig Start Date End Date Taking? Authorizing Provider  albuterol (PROAIR HFA) 108 (90 Base) MCG/ACT inhaler Inhale 2 puffs into the lungs every 4 (four) hours as needed for wheezing or shortness of breath.    [provider]  amLODipine (NORVASC) 10 MG tablet Take 10 mg by mouth daily.  05/31/13   [provider]  atorvastatin (LIPITOR) 80 MG tablet Take 80 mg by mouth every evening.    [provider]  buPROPion (WELLBUTRIN SR) 100 MG 12 hr tablet Take 100 mg by mouth daily.  05/31/13    [provider]  celecoxib (CELEBREX) 200 MG capsule Take 200 mg by mouth 2 (two) times daily.  05/21/16   [provider]  cetirizine (ZYRTEC) 10 MG tablet Take 10 mg by mouth daily.    [provider]  Cholecalciferol (VITAMIN D3) 50000 units CAPS Take 50,000 Units by mouth every Thursday.     [provider]  collagenase (SANTYL) ointment Apply 1 application topically See admin instructions. Apply to wound bed (nickel thick) daily for wound care    [provider]  docusate sodium (COLACE) 100 MG capsule Take 100 mg by mouth 3 (three) times daily.    [provider]  fluticasone (FLONASE) 50 MCG/ACT nasal spray Place 1 spray into both nostrils daily.    [provider]  Fluticasone-Salmeterol (ADVAIR) 100-50 MCG/DOSE AEPB Inhale 1 puff into the lungs 2 (two) times daily.    [provider]  furosemide (LASIX) 40 MG tablet Take 40 mg by mouth 2 (two) times daily.    [provider]  gabapentin (NEURONTIN) 400 MG capsule Take 400 mg by mouth 3 (three) times daily.  05/31/13   [provider]  hydrALAZINE (APRESOLINE) 25 MG tablet Take 25 mg by mouth 3 (three) times daily.     [provider]  ipratropium-albuterol (DUONEB) 0.5-2.5 (3) MG/3ML SOLN Inhale 3 mLs into the lungs every 4 (four) hours as needed (shortness of breath/wheezing).  05/01/16   [provider]  iron polysaccharides (NIFEREX) 150 MG capsule Take 150 mg by mouth daily.    [provider]  levothyroxine (SYNTHROID, LEVOTHROID) 100 MCG tablet Take 100 mcg by mouth daily before breakfast.  05/31/13   [provider]  magnesium oxide (MAG-OX) 400 MG tablet Take 400 mg by mouth daily.    [provider]  meclizine (ANTIVERT) 25 MG tablet Take 25 mg by mouth 2 (two) times daily as needed for dizziness.  05/31/13   [provider]  metFORMIN (GLUCOPHAGE) 500 MG tablet Take 500 mg by mouth 2 (two) times  daily with a meal.    [provider]  metolazone (ZAROXOLYN) 5 MG tablet Take 5 mg by mouth daily.    [provider]  metoprolol (LOPRESSOR) 50 MG tablet Take 50 mg by mouth 2 (two) times daily.  05/31/13   [provider]  montelukast (SINGULAIR) 10 MG tablet Take 1 tablet by mouth at bedtime.     [provider]  nystatin (NYSTATIN) powder Apply topically See admin instructions. Apply to abdominal folds and groin topically twice daily    [provider]  oxycodone (OXY-IR) 5 MG capsule Take 5 mg by mouth every 4 (four) hours as needed.    [provider]  pantoprazole (PROTONIX) 40 MG tablet Take 40 mg by mouth daily.     [provider]  rivaroxaban (XARELTO) 20 MG TABS tablet Take 1  tablet (20 mg total) by mouth daily with supper. 09/23/16   Bettey Costa, MD  senna-docusate (SENOKOT-S) 8.6-50 MG tablet Take 2 tablets by mouth daily.    [provider]  Suvorexant (BELSOMRA) 10 MG TABS Take 10 mg by mouth at bedtime.    [provider]  torsemide (DEMADEX) 10 MG tablet Take 10 mg by mouth daily.    [provider]  ziprasidone (GEODON) 80 MG capsule Take 80 mg by mouth 2 (two) times daily with a meal.    [provider]    Allergies  Allergen Reactions  . Cephalosporins Other (See Comments)    Patient doesn't recall the reaction  . Dilaudid [Hydromorphone Hcl] Other (See Comments)    "CANNOT HEAR, CANNOT THINK"    . Hydrocodone-Acetaminophen Nausea And Vomiting  . Morphine And Related Other (See Comments)    Patient was told by a doctor that this "would kill" him  . Tetracycline Other (See Comments)    Patient was told by a doctor that this "would kill" him  . Tetracyclines & Related Other (See Comments)    Patient doesn't recall the reaction  . Penicillins Rash    Has patient had a PCN reaction causing immediate rash, facial/tongue/throat swelling, SOB or lightheadedness with hypotension:  Yes Has patient had a PCN reaction causing severe rash involving mucus membranes or skin necrosis: No Has patient had a PCN reaction that required hospitalization No Has patient had a PCN reaction occurring within the last 10 years: No If all of the above answers are "NO", then may proceed with Cephalosporin use.   . Sulfa Antibiotics Rash    Family History  Problem Relation Age of Onset  . Diabetes Mother   . Heart attack Father   . Diabetes Brother   . Heart attack Brother     Social History Social History  Substance Use Topics  . Smoking status: Former Smoker    Quit date: 03/15/1963  . Smokeless tobacco: Never Used  . Alcohol use No    Review of Systems Constitutional: Positive for subjective fever Eyes: Negative for visual changes. ENT: Negative for congestion Cardiovascular: Negative for chest pain. Respiratory: Negative for shortness of breath. Positive for cough Gastrointestinal: Negative for abdominal pain, vomiting and diarrhea. Genitourinary: Negative for dysuria. Musculoskeletal: Negative for back pain. Skin: Right knee wound/abscess. Patient denies any increased pain or swelling. Neurological: Negative for headaches, focal weakness or numbness. Positive for generalized weakness. All other ROS negative  ____________________________________________   PHYSICAL EXAM:  VITAL SIGNS: ED Triage Vitals  Enc Vitals Group     BP 10/08/16 1624 136/83     Pulse Rate 10/08/16 1624 73     Resp 10/08/16 1624 (!) 22     Temp 10/08/16 1624 98.2 F (36.8 C)     Temp Source 10/08/16 1624 Oral     SpO2 10/08/16 1624 94 %     Weight 10/08/16 1624 (!) 342 lb 1.6 oz (155.2 kg)     Height 10/08/16 1624 5\' 7"  (1.702 m)     Head Circumference --      Peak Flow --      Pain Score 10/08/16 1623 4     Pain Loc --      Pain Edu? --      Excl. in [place]? --     Constitutional: Alert and oriented4 however the patient does appear weak, somnolent. Eyes: Normal exam ENT    Head: Normocephalic and atraumatic.   Nose: No  congestion/rhinnorhea.   Mouth/Throat: Mucous membranes are moist. Cardiovascular: Normal rate, regular rhythm. No murmur Respiratory: Normal respiratory effort without tachypnea nor retractions. Breath sounds are clear  Gastrointestinal: Soft and nontender. No distention. Obese. Musculoskeletal: Patient has a packed abscess to the right lower extremity/right knee. No significant surrounding erythema or tenderness. No obvious sign of significant infection/burning infection. Neurologic:  Normal speech and language. No gross focal neurologic deficits Skin:  Skin is warm, abscess as described above Psychiatric: Mood and affect are normal.   ____________________________________________    EKG  EKG reviewed and interpreted by myself shows normal sinus rhythm at 69 bpm, narrow QRS, normal axis, prolonged QTC of 575 ms, nonspecific ST changes without ST elevation.  ____________________________________________    RADIOLOGY  Chest x-ray negative  ____________________________________________   INITIAL IMPRESSION / ASSESSMENT AND PLAN / ED COURSE  Pertinent labs & imaging results that were available during my care of the patient were reviewed by me and considered in my medical decision making (see chart for details).  Patient presents the emergency department with subjective fever at home although afebrile in the emergency department. Patient states generalized fatigue and weakness over the past 2 days. States cough over the past several days. Patient currently being treated at wound clinic for a right knee abscess. Given patient's reported fever at home with hypotension in the office sepsis protocols have been initiated. We'll initiate broad-spectrum antibiotics as well as IV fluids. Patient's upper arm blood pressures are reading in the mid 50s however forearm blood pressures are approximately 284 systolic.  Patient's labs are resulted  showing a normal urinalysis, chemistry consistent with hyponatremia as well as renal insufficiency and hypokalemia. We will start potassium repletion, patient receiving IV fluids. CBC is largely normal. Patient's lactic acid is elevated at 3.0 we will continue with broad-spectrum antibiotics. We'll admit to the hospital for further treatment.  CRITICAL CARE Performed by: Harvest Dark   Total critical care time: 30 minutes  Critical care time was exclusive of separately billable procedures and treating other patients.  Critical care was necessary to treat or prevent imminent or life-threatening deterioration.  Critical care was time spent personally by me on the following activities: development of treatment plan with patient and/or surrogate as well as nursing, discussions with consultants, evaluation of patient's response to treatment, examination of patient, obtaining history from patient or surrogate, ordering and performing treatments and interventions, ordering and review of laboratory studies, ordering and review of radiographic studies, pulse oximetry and re-evaluation of patient's condition. ____________________________________________   FINAL CLINICAL IMPRESSION(S) / ED DIAGNOSES  Sepsis Generalized weakness    Harvest Dark, MD 10/08/16 [phone]

## 2016-10-08 NOTE — Consult Note (Addendum)
SURGICAL CONSULTATION NOTE (initial) - cpt: 42683, [id number]  HISTORY OF PRESENT ILLNESS (HPI):  71 y.o. male presented today to [facility] ED upon referral from outpatient wound clinic for hypotension with SBP reportedly in the 50's mmHg and lethargy/weakness. Patient describes that he sometimes feels warm, but his daughter present in the room denies he's had any fevers per his rehab facility where he's been residing since discharge from [facility] on 5/2. During patient's prior admission, he was found to have a large draining purulent infected Right leg hematoma continuous with an open Right knee wound which has been treated at outpatient wound clinic following a fall with fracture while on therapeutic anticoagulation for PE and antiplatelet medications for CAD s/p CABG. Prior to discharge during last admission, 300 mL of purulent hematoma was evacuated and deep fluid cultures were obtained. Patient was discharged to rehab with levafloxacin, to which both the MRSA and Proteus organisms cultured were resistent. Per patient's pharmacist son-in-law and daughter, patient was also taking at his rehab 2 loop diuretics and a third diuretic when he typically takes only one loop diuretic. They both report communicating this to his facility, but they do not believe any regimen changes were made. Patient reports feeling thirstier than usual of the past few days, but has not noticed any increased urination. Patient currently otherwise denies any RLE pain, CP, or SOB. Daughter expresses significant concern that current facility is not a good fit for his care.  Surgery is consulted by medical physician Dr. Anselm Jungling in this context for evaluation and management of Right leg wound and possible abscess.  PAST MEDICAL HISTORY (PMH):  Past Medical History:  Diagnosis Date  . Arthritis   . Asthma   . CHF (congestive heart failure) ([place])   . Diabetes ([place])   . Heart trouble   . History of stomach ulcers   . Hypertension   . MRSA  carrier 08/27/2016  . OSA (obstructive sleep apnea) 08/23/2016  . Pulmonary embolism ([place]) 08/23/2016  . S/P CABG x 2   . Skin cancer   . Stroke [facility])      PAST SURGICAL HISTORY ([place]):  Past Surgical History:  Procedure Laterality Date  . CARDIAC SURGERY    . CHOLECYSTECTOMY    . GALLBLADDER SURGERY    . HEART BYPASS    . HERNIA REPAIR    . IR ANGIOGRAM PULMONARY BILATERAL SELECTIVE  08/23/2016  . IR ANGIOGRAM SELECTIVE EACH ADDITIONAL VESSEL  08/23/2016  . IR ANGIOGRAM SELECTIVE EACH ADDITIONAL VESSEL  08/23/2016  . IR INFUSION THROMBOL ARTERIAL INITIAL (MS)  08/23/2016  . IR INFUSION THROMBOL ARTERIAL INITIAL (MS)  08/23/2016  . IR THROMB F/U EVAL ART/VEN FINAL DAY (MS)  08/24/2016  . IR US GUIDE VASC ACCESS RIGHT  08/23/2016  . KIDNEY SURGERY       MEDICATIONS:  Prior to Admission medications   Medication Sig Start Date End Date Taking? Authorizing Provider  albuterol (PROAIR HFA) 108 (90 Base) MCG/ACT inhaler Inhale 2 puffs into the lungs every 4 (four) hours as needed for wheezing or shortness of breath.   Yes [provider]  amLODipine (NORVASC) 10 MG tablet Take 10 mg by mouth daily.  05/31/13  Yes [provider]  aspirin 81 MG chewable tablet Chew 81 mg by mouth daily.   Yes [provider]  buPROPion (WELLBUTRIN SR) 100 MG 12 hr tablet Take 100 mg by mouth daily.  05/31/13  Yes [provider]  doxepin (SINEQUAN) 10 MG capsule Take 10  mg by mouth at bedtime.   Yes [provider]  fluticasone (FLONASE) 50 MCG/ACT nasal spray Place 1 spray into both nostrils daily.   Yes [provider]  Fluticasone-Salmeterol (ADVAIR) 100-50 MCG/DOSE AEPB Inhale 1 puff into the lungs 2 (two) times daily.   Yes [provider]  furosemide (LASIX) 40 MG tablet Take 40 mg by mouth 2 (two) times daily.   Yes [provider]  ipratropium-albuterol (DUONEB) 0.5-2.5 (3) MG/3ML SOLN Inhale 3 mLs into the lungs every 4 (four) hours as needed  (shortness of breath/wheezing).  05/01/16  Yes [provider]  iron polysaccharides (NIFEREX) 150 MG capsule Take 150 mg by mouth daily.   Yes [provider]  meclizine (ANTIVERT) 25 MG tablet Take 25 mg by mouth 2 (two) times daily as needed for dizziness.  05/31/13  Yes [provider]  metolazone (ZAROXOLYN) 5 MG tablet Take 5 mg by mouth daily.   Yes [provider]  metoprolol (LOPRESSOR) 50 MG tablet Take 50 mg by mouth 2 (two) times daily.  05/31/13  Yes [provider]  montelukast (SINGULAIR) 10 MG tablet Take 1 tablet by mouth at bedtime.    Yes [provider]  pantoprazole (PROTONIX) 40 MG tablet Take 40 mg by mouth daily.    Yes [provider]  rivaroxaban (XARELTO) 20 MG TABS tablet Take 1 tablet (20 mg total) by mouth daily with supper. 09/23/16  Yes Mody, Sital, MD  senna-docusate (SENOKOT-S) 8.6-50 MG tablet Take 2 tablets by mouth daily.   Yes [provider]  Suvorexant (BELSOMRA) 10 MG TABS Take 10 mg by mouth at bedtime.   Yes [provider]  ziprasidone (GEODON) 80 MG capsule Take 80 mg by mouth 2 (two) times daily with a meal.   Yes [provider]  levothyroxine (SYNTHROID, LEVOTHROID) 100 MCG tablet Take 100 mcg by mouth daily before breakfast.  05/31/13   [provider]     ALLERGIES:  Allergies  Allergen Reactions  . Cephalosporins Other (See Comments)    Patient doesn't recall the reaction  . Dilaudid [Hydromorphone Hcl] Other (See Comments)    "CANNOT HEAR, CANNOT THINK"    . Hydrocodone-Acetaminophen Nausea And Vomiting  . Morphine And Related Other (See Comments)    Patient was told by a doctor that this "would kill" him  . Tetracycline Other (See Comments)    Patient was told by a doctor that this "would kill" him  . Tetracyclines & Related Other (See Comments)    Patient doesn't recall the reaction  . Penicillins Rash    Has patient had a PCN reaction causing  immediate rash, facial/tongue/throat swelling, SOB or lightheadedness with hypotension: Yes Has patient had a PCN reaction causing severe rash involving mucus membranes or skin necrosis: No Has patient had a PCN reaction that required hospitalization No Has patient had a PCN reaction occurring within the last 10 years: No If all of the above answers are "NO", then may proceed with Cephalosporin use.   . Sulfa Antibiotics Rash     SOCIAL HISTORY:  Social History   Social History  . Marital status: Divorced    Spouse name: N/A  . Number of children: N/A  . Years of education: N/A   Occupational History  . disabled    Social History Main Topics  . Smoking status: Former Smoker    Quit date: 03/15/1963  . Smokeless tobacco: Never Used  . Alcohol use No  . Drug  use: No  . Sexual activity: No   Other Topics Concern  . Not on file   Social History Narrative  . No narrative on file    The patient currently resides (home / rehab facility / nursing home): Rehab facility  The patient normally is (ambulatory / bedbound): Limited mobility with assist   FAMILY HISTORY:  Family History  Problem Relation Age of Onset  . Diabetes Mother   . Heart attack Father   . Diabetes Brother   . Heart attack Brother    REVIEW OF SYSTEMS:  Constitutional: denies weight loss, fever, chills, or sweats  Eyes: denies any other vision changes, history of eye injury  ENT: denies sore throat, hearing problems  Respiratory: denies shortness of breath, wheezing  Cardiovascular: denies chest pain, palpitations  Gastrointestinal: denies abdominal pain, N/V, or diarrhea Genitourinary: denies burning with urination or urinary frequency Musculoskeletal: denies any other joint pains or cramps  Skin: denies any other rashes or skin discolorations except as per HPI Neurological: denies any other headache, dizziness, weakness  Psychiatric: denies any other depression, anxiety   All other review of  systems were negative   VITAL SIGNS:  Temp:  [98.2 F (36.8 C)] 98.2 F (36.8 C) (05/17 1624) Pulse Rate:  [69-73] 73 (05/17 1913) Resp:  [15-22] 20 (05/17 1913) BP: (98-136)/(50-83) 98/61 (05/17 1913) SpO2:  [90 %-96 %] 96 % (05/17 1913) Weight:  [342 lb 1.6 oz (155.2 kg)] 342 lb 1.6 oz (155.2 kg) (05/17 1624)     Height: 5' 7"  (170.2 cm) Weight: (!) 342 lb 1.6 oz (155.2 kg) BMI (Calculated): 53.7   INTAKE/OUTPUT:  This shift: Total I/O In: 1199 [IV Piggyback:1199] Out: -   Last 2 shifts: @IOLAST2SHIFTS @   PHYSICAL EXAM:  Constitutional:  -- Obese body habitus  -- Awake, alert, and oriented x3, though patient's responses appear frequently delayed and either inappropriate for the question asked or inconsistent with the history/answers to questions provided by patient's daughter at patient's bedside Eyes:  -- Pupils equally round and reactive to light  -- No scleral icterus  Ear, nose, and throat:  -- No jugular venous distension  Pulmonary:  -- No crackles  -- Equal breath sounds bilaterally -- Breathing non-labored at rest Cardiovascular:  -- S1, S2 present  -- No pericardial rubs Gastrointestinal:  -- Abdomen soft, obese, nontender, nondistended, no guarding/rebound  -- No abdominal masses appreciated, pulsatile or otherwise  Musculoskeletal and Integumentary:  -- Wounds or skin discoloration: Right leg 24 cm (length) x 10 cm (width) subcutaneous cavity deep to what appears to be level of fascia without any cutaneous erythema and continuous with stage 3 Right knee pressure wound containing only a single 4" x 4" gauze packing before resistance is met to Yankauer suction, which drained only 60 mL of completely non-purulent blood when advanced through the scar tissue encountered. Once past the scar tissue, nearly an entire roll of cling (gauze) was able to be packed into the wound with only mild discomfort expressed by the patient. -- Extremities: B/L UE and LE FROM, hands and  feet warm, B/L non-pitting edema vs obesity Neurologic:  -- Motor function: intact and symmetric -- Sensation: intact and symmetric  Labs:  CBC:  Lab Results  Component Value Date   WBC 9.6 10/08/2016   RBC 4.74 10/08/2016   BMP:  Lab Results  Component Value Date   GLUCOSE 196 (H) 10/08/2016   GLUCOSE 130 (H) 08/22/2014   CO2 34 (H) 10/08/2016   CO2  27 08/22/2014   BUN 51 (H) 10/08/2016   BUN 11 08/22/2014   CREATININE 2.32 (H) 10/08/2016   CREATININE 0.82 08/22/2014   CALCIUM 9.5 10/08/2016   CALCIUM 8.5 (L) 08/22/2014   Coagulation:  Lab Results  Component Value Date   INR 1.12 10/08/2016   INR 1.1 08/20/2014   APTT 26 08/23/2016   APTT 29.8 06/05/2013     Assessment/Plan: (ICD-10's: R57.1, L02.415) 71 y.o. male with what appears to be primarily or entirely profound hypovolemic shock with AKI and corresponding hyperlactasemia +/- uncertain component of septic shock without any fever, leukocytosis, purulent fluid, or cellulitis, complicated by anticoagulation for PE and antiplatelet therapy for CAD s/p CABG as well as by pertinent comorbidities including DM, HTN, CAD s/p CABG, CHF, COPD, OSA, osteoarthritis, and GERD with PUD.   - fluid resuscitation as per primary medical team  - IV antibiotics appropriate for culture-positive MRSA and Proteus   - daily packing of entire wound cavity to prevent loculation and re-accumulation of hematoma  - will consider how to optimize and promote adequate drainage, but no surgical intervention indicated at this time  - medical management of patient's extensive medical co-morbidities as per medical team  - PT and disposition as per primary team  All of the above findings and recommendations were discussed with the patient, his daughter (bedside), his son-in-law (on phone), and patient's admitting medical physician who all express agreement with the above impression and plan, and all of patient's and his family's questions were answered  to their expressed satisfaction.  Thank you for the opportunity to participate in this patient's care.  -- Marilynne Drivers Rosana Hoes, MD, [place]: [facility] - Partnering for exceptional care. Office: [phone]

## 2016-10-09 DIAGNOSIS — L02415 Cutaneous abscess of right lower limb: Secondary | ICD-10-CM

## 2016-10-09 LAB — CBC
HCT: 31.1 % — ABNORMAL LOW (ref 40.0–52.0)
HEMOGLOBIN: 9.9 g/dL — AB (ref 13.0–18.0)
MCH: 24.5 pg — ABNORMAL LOW (ref 26.0–34.0)
MCHC: 31.8 g/dL — ABNORMAL LOW (ref 32.0–36.0)
MCV: 77.1 fL — ABNORMAL LOW (ref 80.0–100.0)
Platelets: 347 10*3/uL (ref 150–440)
RBC: 4.04 MIL/uL — ABNORMAL LOW (ref 4.40–5.90)
RDW: 17.4 % — ABNORMAL HIGH (ref 11.5–14.5)
WBC: 6.7 10*3/uL (ref 3.8–10.6)

## 2016-10-09 LAB — BASIC METABOLIC PANEL
ANION GAP: 7 (ref 5–15)
BUN: 38 mg/dL — ABNORMAL HIGH (ref 6–20)
CO2: 31 mmol/L (ref 22–32)
Calcium: 7.9 mg/dL — ABNORMAL LOW (ref 8.9–10.3)
Chloride: 94 mmol/L — ABNORMAL LOW (ref 101–111)
Creatinine, Ser: 1.45 mg/dL — ABNORMAL HIGH (ref 0.61–1.24)
GFR, EST AFRICAN AMERICAN: 54 mL/min — AB (ref >60)
GFR, EST NON AFRICAN AMERICAN: 47 mL/min — AB (ref >60)
Glucose, Bld: 146 mg/dL — ABNORMAL HIGH (ref 65–99)
Potassium: 3.4 mmol/L — ABNORMAL LOW (ref 3.5–5.1)
SODIUM: 132 mmol/L — AB (ref 135–145)

## 2016-10-09 LAB — MRSA PCR SCREENING: MRSA by PCR: POSITIVE — AB

## 2016-10-09 LAB — GLUCOSE, CAPILLARY
GLUCOSE-CAPILLARY: 178 mg/dL — AB (ref 65–99)
Glucose-Capillary: 195 mg/dL — ABNORMAL HIGH (ref 65–99)

## 2016-10-09 MED ORDER — CEFTRIAXONE SODIUM 1 G IJ SOLR: 1.0000 g | INTRAMUSCULAR | Status: DC

## 2016-10-09 MED ORDER — VANCOMYCIN HCL IN DEXTROSE 750-5 MG/150ML-% IV SOLN
750.0000 mg | Freq: Two times a day (BID) | INTRAVENOUS | Status: DC
  Administered 2016-10-09 – 2016-10-11 (×4): 750 mg via INTRAVENOUS
  Filled 2016-10-09 (×5): qty 150

## 2016-10-09 MED ORDER — SODIUM CHLORIDE 0.9 % IV SOLN
1.0000 g | Freq: Three times a day (TID) | INTRAVENOUS | Status: DC
  Filled 2016-10-09 (×2): qty 1

## 2016-10-09 MED ORDER — CHLORHEXIDINE GLUCONATE CLOTH 2 % EX PADS
6.0000 | MEDICATED_PAD | Freq: Every day | CUTANEOUS | Status: DC
  Administered 2016-10-10 – 2016-10-12 (×3): 6 via TOPICAL

## 2016-10-09 MED ORDER — INSULIN ASPART 100 UNIT/ML ~~LOC~~ SOLN
0.0000 [IU] | Freq: Three times a day (TID) | SUBCUTANEOUS | Status: DC
  Administered 2016-10-09: 2 [IU] via SUBCUTANEOUS
  Administered 2016-10-10: 1 [IU] via SUBCUTANEOUS
  Administered 2016-10-10: 3 [IU] via SUBCUTANEOUS
  Administered 2016-10-10 – 2016-10-11 (×2): 2 [IU] via SUBCUTANEOUS
  Administered 2016-10-11: 1 [IU] via SUBCUTANEOUS
  Administered 2016-10-11 – 2016-10-12 (×2): 2 [IU] via SUBCUTANEOUS
  Administered 2016-10-12: 1 [IU] via SUBCUTANEOUS
  Filled 2016-10-09 (×2): qty 2
  Filled 2016-10-09: qty 3
  Filled 2016-10-09: qty 2
  Filled 2016-10-09: qty 1
  Filled 2016-10-09: qty 2
  Filled 2016-10-09 (×2): qty 1

## 2016-10-09 MED ORDER — INSULIN ASPART 100 UNIT/ML ~~LOC~~ SOLN
0.0000 [IU] | Freq: Every day | SUBCUTANEOUS | Status: DC
Start: 2016-10-09 — End: 2016-10-12
  Filled 2016-10-09: qty 2

## 2016-10-09 MED ORDER — HYDROCODONE-ACETAMINOPHEN 7.5-325 MG PO TABS
1.0000 | ORAL_TABLET | Freq: Four times a day (QID) | ORAL | Status: DC | PRN
  Administered 2016-10-09 – 2016-10-10 (×4): 1 via ORAL
  Filled 2016-10-09 (×5): qty 1

## 2016-10-09 MED ORDER — MUPIROCIN 2 % EX OINT
1.0000 "application " | TOPICAL_OINTMENT | Freq: Two times a day (BID) | CUTANEOUS | Status: DC
  Administered 2016-10-09 – 2016-10-12 (×6): 1 via NASAL
  Filled 2016-10-09 (×2): qty 22

## 2016-10-09 MED ORDER — DEXTROSE 5 % IV SOLN
2.0000 g | INTRAVENOUS | Status: DC
  Administered 2016-10-09 – 2016-10-11 (×3): 2 g via INTRAVENOUS
  Filled 2016-10-09 (×4): qty 2

## 2016-10-09 NOTE — Progress Notes (Signed)
Pharmacy Antibiotic Note  [name] is a 71 y.o. male admitted on 10/08/2016 with infection and abscess on right knee tha continues to get worse. Patient had  I & D done 2 weeks ago and sent home on levaquin.   Pharmacy has been consulted for meropenem and vancomycin dosing  Plan: Will change to Vancomycin  750 mg IV q12 hours. Will check trough level prior to the 5th dose of regimen @ 1200 on 5/20.   Merrem: Change to 1 g IV q8 hours.   Height: 5\' 7"  (170.2 cm) Weight: (!) 342 lb 1.6 oz (155.2 kg) IBW/kg (Calculated) : 66.1  Temp (24hrs), Avg:98.2 F (36.8 C), Min:98.1 F (36.7 C), Max:98.4 F (36.9 C)   Recent Labs Lab 10/08/16 1629 10/08/16 1630 10/08/16 1950 10/09/16 0418  WBC 9.6  --   --  6.7  CREATININE 2.32*  --   --  1.45*  LATICACIDVEN  --  3.0* 3.4*  --     Estimated Creatinine Clearance: 67.2 mL/min (A) (by C-G formula based on SCr of 1.45 mg/dL (H)).    Allergies  Allergen Reactions  . Cephalosporins Other (See Comments)    Patient doesn't recall the reaction  . Dilaudid [Hydromorphone Hcl] Other (See Comments)    "CANNOT HEAR, CANNOT THINK"    . Hydrocodone-Acetaminophen Nausea And Vomiting  . Morphine And Related Other (See Comments)    Patient was told by a doctor that this "would kill" him  . Tetracycline Other (See Comments)    Patient was told by a doctor that this "would kill" him  . Tetracyclines & Related Other (See Comments)    Patient doesn't recall the reaction  . Penicillins Rash    Has patient had a PCN reaction causing immediate rash, facial/tongue/throat swelling, SOB or lightheadedness with hypotension: Yes Has patient had a PCN reaction causing severe rash involving mucus membranes or skin necrosis: No Has patient had a PCN reaction that required hospitalization No Has patient had a PCN reaction occurring within the last 10 years: No If all of the above answers are "NO", then may proceed with Cephalosporin use.   . Sulfa  Antibiotics Rash    Antimicrobials this admission: 5/17 vancomycin>>  5/17 meropenem >>   Dose adjustments this admission:  Microbiology results: 5/17 BCx: sent 5/17 UCx: sent   Thank you for allowing pharmacy to be a part of this patient's care.  Larene Beach, PharmD, BCPS Clinical Pharmacist 10/09/2016 12:01 PM

## 2016-10-09 NOTE — Progress Notes (Signed)
CRITICAL VALUE ALERT  Critical value received:  MRSA PCR +     Date of notification:  10/09/2016   Time of notification:  3291  Critical value read back:Yes.    Nurse who received alert:  A. Jimmye Norman  MD notified (1st page):  Dr Marcille Blanco  Time of first page:  0500  MD notified (2nd page):  Time of second page:  Responding MD:  Dr Marcille Blanco  Time MD responded:  0500

## 2016-10-09 NOTE — Progress Notes (Signed)
[place] [facility] [place] NAME: Mohab Ashby    MR#:  [id number]  DATE OF BIRTH:  09/14/1945  SUBJECTIVE:  Not much complaint, sleepy REVIEW OF SYSTEMS:  CONSTITUTIONAL: positive for fever, fatigue or weakness.  EYES: No blurred or double vision.  EARS, NOSE, AND THROAT: No tinnitus or ear pain.  RESPIRATORY: No cough, shortness of breath, wheezing or hemoptysis.  CARDIOVASCULAR: No chest pain, orthopnea, edema.  GASTROINTESTINAL: No nausea, vomiting, diarrhea or abdominal pain.  GENITOURINARY: No dysuria, hematuria.  ENDOCRINE: No polyuria, nocturia,  HEMATOLOGY: No anemia, easy bruising or bleeding SKIN: No rash or lesion. MUSCULOSKELETAL: right knee joint pain or arthritis.   NEUROLOGIC: No tingling, numbness, weakness.  PSYCHIATRY: No anxiety or depression.   Tolerating Diet: yes DRUG ALLERGIES:   Allergies  Allergen Reactions  . Cephalosporins Other (See Comments)    Patient doesn't recall the reaction  . Dilaudid [Hydromorphone Hcl] Other (See Comments)    "CANNOT HEAR, CANNOT THINK"    . Hydrocodone-Acetaminophen Nausea And Vomiting  . Morphine And Related Other (See Comments)    Patient was told by a doctor that this "would kill" him  . Tetracycline Other (See Comments)    Patient was told by a doctor that this "would kill" him  . Tetracyclines & Related Other (See Comments)    Patient doesn't recall the reaction  . Penicillins Rash    Has patient had a PCN reaction causing immediate rash, facial/tongue/throat swelling, SOB or lightheadedness with hypotension: Yes Has patient had a PCN reaction causing severe rash involving mucus membranes or skin necrosis: No Has patient had a PCN reaction that required hospitalization No Has patient had a PCN reaction occurring within the last 10 years: No If all of the above answers are "NO", then may proceed with Cephalosporin use.   . Sulfa Antibiotics Rash    VITALS:  Blood  pressure (!) 115/41, pulse 85, temperature 98 F (36.7 C), temperature source Oral, resp. rate (!) 22, height 5\' 7"  (1.702 m), weight (!) 155.2 kg (342 lb 1.6 oz), SpO2 96 %.  PHYSICAL EXAMINATION:  GENERAL:  71 y.o.-year-old patient lying in the bed with no acute distress.  EYES: Pupils equal, round, reactive to light and accommodation. No scleral icterus. Extraocular muscles intact.  HEENT: Head atraumatic, normocephalic. Oropharynx and nasopharynx clear.  NECK:  Supple, no jugular venous distention. No thyroid enlargement, no tenderness.  LUNGS: Normal breath sounds bilaterally, no wheezing, rales,rhonchi or crepitation. No use of accessory muscles of respiration.  CARDIOVASCULAR: S1, S2 normal. No murmurs, rubs, or gallops.  ABDOMEN: Soft, nontender, nondistended. Bowel sounds present. No organomegaly or mass.  EXTREMITIES: No pedal edema, cyanosis, or clubbing. Right knee swelling and painful.  NEUROLOGIC: Cranial nerves II through XII are intact. Muscle strength 4/5 in all extremities. Sensation intact. Gait not checked.  PSYCHIATRIC: The patient is alert and oriented x 3.  SKIN: No obvious rash, lesion, or ulcer.  LABORATORY PANEL:   CBC  Recent Labs Lab 10/09/16 0418  WBC 6.7  HGB 9.9*  HCT 31.1*  PLT 347   ------------------------------------------------------------------------------------------------------------------  Chemistries   Recent Labs Lab 10/08/16 1629 10/09/16 0418  NA 126* 132*  K 2.6* 3.4*  CL 79* 94*  CO2 34* 31  GLUCOSE 196* 146*  BUN 51* 38*  CREATININE 2.32* 1.45*  CALCIUM 9.5 7.9*  AST 29  --   ALT 21  --   ALKPHOS 72  --   BILITOT 0.7  --    ------------------------------------------------------------------------------------------------------------------  Cardiac Enzymes No results for input(s): TROPONINI in the last 168  hours. ------------------------------------------------------------------------------------------------------------------  RADIOLOGY:  Ct Knee Right Wo Contrast  Result Date: 10/08/2016 CLINICAL DATA:  Hypotension, lethargy and subjective fever at home for the past 2 days. Patient is being treated at wound clinic for right knee abscess/wound. EXAM: CT OF THE RIGHT KNEE WITHOUT CONTRAST TECHNIQUE: Multidetector CT imaging of the right knee was performed according to the standard protocol. Multiplanar CT image reconstructions were also generated. COMPARISON:  09/21/2016 FINDINGS: Bones/Joint/Cartilage Healing nondisplaced proximal fibular head fracture is again noted. Tricompartmental osteoarthritis of the knee. No new fracture is identified. No signify joint effusion is seen. No frank bone destruction to suggest osteomyelitis. Ligaments Suboptimally assessed by CT. Muscles and Tendons Nonacute Soft tissues There appears to have been some debridement with open wound noted of the soft tissues overlying the patellar tendon. There has been some interval decompression of the fluid cavity noted in this region. However there does appear to be persistent complex fluid collection along the anterolateral aspect of the pretibial soft tissues incompletely imaged on current exam estimated at 10 x 7.4 x 3.3 cm. IMPRESSION: 1. Persistent pretibial soft tissue fluid collection measuring approximately 10 x 7.4 x 3.3 cm without underlying osseous involvement. The abscess/fluid collection slightly more cephalad to this region and overlying the patellar tendon has been partially evacuated with open wound/ulceration present. 2. Healing fibular head fracture without significant change. No new osseous abnormality. 3. Osteoarthritis of the knee. Electronically Signed   By: Ashley Royalty M.D.   On: 10/08/2016 21:09   Dg Chest Port 1 View  Result Date: 10/08/2016 CLINICAL DATA:  Hypotensive, cough EXAM: PORTABLE CHEST 1 VIEW  COMPARISON:  08/24/2016 FINDINGS: Post sternotomy changes. Fracture through the first 3 sternal wires as before. Low lung volumes with elevated right diaphragm and right basilar atelectasis. No large effusion. Cardiomegaly. Subsegmental atelectasis left base. IMPRESSION: Elevated right diaphragm with subsegmental atelectasis at the bilateral bases. Cardiomegaly without edema or definitive infiltrate. Electronically Signed   By: Donavan Foil M.D.   On: 10/08/2016 17:03     ASSESSMENT AND PLAN:  71 year old morbidly obese man with OSA who was sent by wound care physician for right knee Infection/abscess  * Sepsis: Present on admission   Right knee abscess - Continue IV vancomycin and meropenem for culture-positive MRSA and Proteus (allergic to ceftriaxone and penicillin ) - daily packing of entire wound cavity to prevent loculation and re-accumulation of hematoma - Appreciate surgical input, no surgical intervention indicated at this time - ID consult. - Wound care nurse consult  * Ac renal failure   Hold lasix, hold BP meds  improving with  IV fluids. - Creatinine down to 1.45 from 2.3  * Hypokalemia Repleted and recheck  * PE-diagnosed in April 2018   On xarelto, cont for now.  * Hypothyroidism   Cont levothyroxine  * COPD   No exacerbation.  * OSA: CPAP  * Diabetes: Continue ADA diet with sliding scale insulin  * Chronic anemia on iron  * Hyperlipidemia: Continue statin  * Depression: Continue Wellbutrin  * Essential hypertension: Continue Norvasc, isosorbide and metoprolol  * Chronic Diastolic heart failure: No signs of exacerbation    CODE STATUS: FULL  TOTAL TIME TAKING CARE OF THIS PATIENT: 28 minutes.   POSSIBLE D/C 1-2 days, DEPENDING ON CLINICAL CONDITION.   Max Sane M.D on 10/09/2016 at 3:30 PM  Between 7am to 6pm - Pager - [phone] After 6pm go to [email or web address] -  password [email or web address] [facility]  Office   [phone]  CC: Primary care physician; Jodi Marble, MD  Note: This dictation was prepared with Dragon dictation along with smaller phrase technology. Any transcriptional errors that result from this process are unintentional.

## 2016-10-09 NOTE — Progress Notes (Signed)
CC: Right leg wound Subjective: This patient admitted the hospital for multiple medical problems and a right leg wound with prior hematoma. I have spoken with Dr. Rosana Hoes concerning his care doctor Davis's note delineates his findings well. Today the patient is sleeping and barely wakes up and is nonconversant. He does not wake up to voice nor to turning the lights on nor to his IV and sessile he BP being in barely wakes up to palpation and does not wake up during the dressing change.  Objective: Vital signs in last 24 hours: Temp:  [98.1 F (36.7 C)-98.4 F (36.9 C)] 98.1 F (36.7 C) (05/18 0423) Pulse Rate:  [66-73] 71 (05/18 0423) Resp:  [15-22] 18 (05/18 0423) BP: (98-136)/(50-83) 115/51 (05/18 0423) SpO2:  [90 %-99 %] 99 % (05/18 0423) Weight:  [342 lb 1.6 oz (155.2 kg)] 342 lb 1.6 oz (155.2 kg) (05/17 1624)    Intake/Output from previous day: 05/17 0701 - 05/18 0700 In: 3840.7 [P.O.:240; I.V.:501.7; IV [id number]] Out: -  Intake/Output this shift: No intake/output data recorded.  Physical exam:  Morbidly obese male patient vital signs are stable he is afebrile As above he does not wake up to voice or light stimulus but does wake up with palpation  Right leg wound is open without erythema no purulence no expressible purulence and no overlying erythema to suggest abscess  Lab Results: CBC   Recent Labs  10/08/16 1629 10/09/16 0418  WBC 9.6 6.7  HGB 11.3* 9.9*  HCT 36.1* 31.1*  PLT 449* 347   BMET  Recent Labs  10/08/16 1629 10/09/16 0418  NA 126* 132*  K 2.6* 3.4*  CL 79* 94*  CO2 34* 31  GLUCOSE 196* 146*  BUN 51* 38*  CREATININE 2.32* 1.45*  CALCIUM 9.5 7.9*   PT/INR  Recent Labs  10/08/16 1629  LABPROT 14.5  INR 1.12   ABG No results for input(s): PHART, HCO3 in the last 72 hours.  Invalid input(s): PCO2, PO2  Studies/Results: Ct Knee Right Wo Contrast  Result Date: 10/08/2016 CLINICAL DATA:  Hypotension, lethargy and subjective  fever at home for the past 2 days. Patient is being treated at wound clinic for right knee abscess/wound. EXAM: CT OF THE RIGHT KNEE WITHOUT CONTRAST TECHNIQUE: Multidetector CT imaging of the right knee was performed according to the standard protocol. Multiplanar CT image reconstructions were also generated. COMPARISON:  09/21/2016 FINDINGS: Bones/Joint/Cartilage Healing nondisplaced proximal fibular head fracture is again noted. Tricompartmental osteoarthritis of the knee. No new fracture is identified. No signify joint effusion is seen. No frank bone destruction to suggest osteomyelitis. Ligaments Suboptimally assessed by CT. Muscles and Tendons Nonacute Soft tissues There appears to have been some debridement with open wound noted of the soft tissues overlying the patellar tendon. There has been some interval decompression of the fluid cavity noted in this region. However there does appear to be persistent complex fluid collection along the anterolateral aspect of the pretibial soft tissues incompletely imaged on current exam estimated at 10 x 7.4 x 3.3 cm. IMPRESSION: 1. Persistent pretibial soft tissue fluid collection measuring approximately 10 x 7.4 x 3.3 cm without underlying osseous involvement. The abscess/fluid collection slightly more cephalad to this region and overlying the patellar tendon has been partially evacuated with open wound/ulceration present. 2. Healing fibular head fracture without significant change. No new osseous abnormality. 3. Osteoarthritis of the knee. Electronically Signed   By: Ashley Royalty M.D.   On: 10/08/2016 21:09   Dg Chest Port 1  View  Result Date: 10/08/2016 CLINICAL DATA:  Hypotensive, cough EXAM: PORTABLE CHEST 1 VIEW COMPARISON:  08/24/2016 FINDINGS: Post sternotomy changes. Fracture through the first 3 sternal wires as before. Low lung volumes with elevated right diaphragm and right basilar atelectasis. No large effusion. Cardiomegaly. Subsegmental atelectasis left  base. IMPRESSION: Elevated right diaphragm with subsegmental atelectasis at the bilateral bases. Cardiomegaly without edema or definitive infiltrate. Electronically Signed   By: Donavan Foil M.D.   On: 10/08/2016 17:03    Anti-infectives: Anti-infectives    Start     Dose/Rate Route Frequency Ordered Stop   10/09/16 0200  vancomycin (VANCOCIN) 1,750 mg in sodium chloride 0.9 % 500 mL IVPB     1,750 mg 250 mL/hr over 120 Minutes Intravenous Every 24 hours 10/08/16 2013     10/08/16 2200  meropenem (MERREM) 1 g in sodium chloride 0.9 % 100 mL IVPB  Status:  Discontinued     1 g 200 mL/hr over 30 Minutes Intravenous Every 8 hours 10/08/16 2013 10/08/16 2017   10/08/16 2200  meropenem (MERREM) 1 g in sodium chloride 0.9 % 100 mL IVPB     1 g 200 mL/hr over 30 Minutes Intravenous Every 12 hours 10/08/16 2017     10/08/16 1645  levofloxacin (LEVAQUIN) IVPB 750 mg     750 mg 100 mL/hr over 90 Minutes Intravenous  Once 10/08/16 1637 10/08/16 1855   10/08/16 1645  aztreonam (AZACTAM) 2 g in dextrose 5 % 50 mL IVPB     2 g 100 mL/hr over 30 Minutes Intravenous  Once 10/08/16 1637 10/08/16 1815   10/08/16 1645  vancomycin (VANCOCIN) IVPB 1000 mg/200 mL premix     1,000 mg 200 mL/hr over 60 Minutes Intravenous  Once 10/08/16 1637 10/09/16 0018      Assessment/Plan:  See Dr. Shann Medal note for complete description of this wound. At this point I see no need for surgical intervention. If this were to fail to drain properly at any point and inferior incision could be performed however the patient is anticoagulated on multiple antiplatelet medications and that would be difficult. At this point there is no sign of abscess snores or sign of active infection therefore I would not recommend any surgical intervention in this patient in agreement with Dr. Shann Medal note and with my discussion with him personally. No surgical needs at this time we'll sign off  Florene Glen, MD, FACS  10/09/2016

## 2016-10-09 NOTE — Consult Note (Signed)
[place] Nurse wound consult note Reason for Consult:Surgical evacuation of hematoma to right anterior leg below knee.  Surgical orders for daily NS moist kerlix packing to avoid premature closure of the wound or re-accumulation of hematoma.   Wound type: trauma wound, subsequently evacuated hematoma.  Pressure Injury POA: N/A Measurement: Right leg, per surgical notes: "Right leg 24 cm (length) x 10 cm (width) subcutaneous cavity deep to what appears to be level of fascia without any cutaneous erythema and continuous with stage 3 Right knee pressure wound containing only a single 4" x 4" gauze packing before resistance is met to Yankauer suction, which drained only 60 mL of completely non-purulent blood when advanced through the scar tissue encountered. Once past the scar tissue, nearly an entire roll of cling (gauze) was able to be packed into the wound with only mild discomfort expressed by the patient." Wound bed: Ruddy red Drainage (amount, consistency, odor) Moderate bloody effluent Periwound:Intact Dressing procedure/placement/frequency:Cleanse wound to right leg with NS.  Gently fill wound bed with NS moist kerlix (ensure that the entire depth is packed lightly- see notes) Change daily and PRN strikethrough.  Will not follow at this time.  Please re-consult if needed.  Domenic Moras RN BSN [place] Pager [phone]

## 2016-10-09 NOTE — Consult Note (Signed)
[facility] Infectious Disease     Reason for Consult: Wound infection     Referring Physician: Dolores Frame Date of Admission:  10/08/2016   Principal Problem:   Abscess of right leg excluding foot Active Problems:   Abscess   HPI: [name] is a 71 y.o. male admitted with worsening R knee wound on 5/17. He had increasing weakness, FC and knee pan. On admit had hypotension, wbc 9.6, no fevers.  He has multiple medical issues and this wound began as a large hematoma on the R knee.   This progressed to a chronic wound, followed at wound care center with a wound vac.  On 4/30 admitted and had drainage of the hematoma and he was dced on elvo. Cx with MRSA and proteus. Also per notes on keflex.   Past Medical History:  Diagnosis Date  . Arthritis   . Asthma   . CHF (congestive heart failure) ([place])   . Diabetes ([place])   . Heart trouble   . History of stomach ulcers   . Hypertension   . 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 OSA (obstructive sleep apnea) 08/23/2016  . Pulmonary embolism ([place]) 08/23/2016  . S/P CABG x 2   . Skin cancer   . Stroke [facility])    Past Surgical History:  Procedure Laterality Date  . CARDIAC SURGERY    . CHOLECYSTECTOMY    . GALLBLADDER SURGERY    . HEART BYPASS    . HERNIA REPAIR    . IR ANGIOGRAM PULMONARY BILATERAL SELECTIVE  08/23/2016  . IR ANGIOGRAM SELECTIVE EACH ADDITIONAL VESSEL  08/23/2016  . IR ANGIOGRAM SELECTIVE EACH ADDITIONAL VESSEL  08/23/2016  . IR INFUSION THROMBOL ARTERIAL INITIAL (MS)  08/23/2016  . IR INFUSION THROMBOL ARTERIAL INITIAL (MS)  08/23/2016  . IR THROMB F/U EVAL ART/VEN FINAL DAY (MS)  08/24/2016  . IR US GUIDE VASC ACCESS RIGHT  08/23/2016  . KIDNEY SURGERY     Social History  Substance Use Topics  . Smoking status: Former Smoker    Quit date: 03/15/1963  . Smokeless tobacco: Never Used  . Alcohol use No   Family History  Problem Relation Age of Onset  . Diabetes Mother   . Heart attack Father   . Diabetes Brother   . Heart attack  Brother     Allergies:  Allergies  Allergen Reactions  . Cephalosporins Other (See Comments)    Patient doesn't recall the reaction  . Dilaudid [Hydromorphone Hcl] Other (See Comments)    "CANNOT HEAR, CANNOT THINK"    . Hydrocodone-Acetaminophen Nausea And Vomiting  . Morphine And Related Other (See Comments)    Patient was told by a doctor that this "would kill" him  . Tetracycline Other (See Comments)    Patient was told by a doctor that this "would kill" him  . Tetracyclines & Related Other (See Comments)    Patient doesn't recall the reaction  . Penicillins Rash    Has patient had a PCN reaction causing immediate rash, facial/tongue/throat swelling, SOB or lightheadedness with hypotension: Yes Has patient had a PCN reaction causing severe rash involving mucus membranes or skin necrosis: No Has patient had a PCN reaction that required hospitalization No Has patient had a PCN reaction occurring within the last 10 years: No If all of the above answers are "NO", then may proceed with Cephalosporin use.   . Sulfa Antibiotics Rash    Current antibiotics: Antibiotics Given (last 72 hours)    Date/Time Action  Medication Dose Rate   10/08/16 1725 New Bag/Given   levofloxacin (LEVAQUIN) IVPB 750 mg 750 mg 100 mL/hr   10/08/16 1745 New Bag/Given   aztreonam (AZACTAM) 2 g in dextrose 5 % 50 mL IVPB 2 g 100 mL/hr   10/08/16 1844 New Bag/Given   vancomycin (VANCOCIN) IVPB 1000 mg/200 mL premix 1,000 mg 200 mL/hr   10/08/16 2300 New Bag/Given   meropenem (MERREM) 1 g in sodium chloride 0.9 % 100 mL IVPB 1 g 200 mL/hr   10/09/16 0200 New Bag/Given   vancomycin (VANCOCIN) 1,750 mg in sodium chloride 0.9 % 500 mL IVPB 1,750 mg 250 mL/hr   10/09/16 1042 New Bag/Given   meropenem (MERREM) 1 g in sodium chloride 0.9 % 100 mL IVPB 1 g 200 mL/hr   10/09/16 1315 New Bag/Given   vancomycin (VANCOCIN) IVPB 750 mg/150 ml premix 750 mg 150 mL/hr      MEDICATIONS: . aspirin  81 mg Oral  Daily  . buPROPion  100 mg Oral Daily  . Chlorhexidine Gluconate Cloth  6 each Topical Q0600  . doxepin  10 mg Oral QHS  . fluticasone  1 spray Each Nare Daily  . gabapentin  400 mg Oral QHS  . insulin aspart  0-5 Units Subcutaneous QHS  . insulin aspart  0-9 Units Subcutaneous TID WC  . levothyroxine  100 mcg Oral QAC breakfast  . mometasone-formoterol  2 puff Inhalation BID  . montelukast  10 mg Oral QHS  . mupirocin ointment  1 application Nasal BID  . pantoprazole  40 mg Oral Daily  . potassium chloride  20 mEq Oral BID  . rivaroxaban  20 mg Oral Q breakfast  . senna-docusate  2 tablet Oral Daily  . ziprasidone  80 mg Oral BID WC    Review of Systems - 11 systems reviewed and negative per HPI   OBJECTIVE: Temp:  [98 F (36.7 C)-98.4 F (36.9 C)] 98 F (36.7 C) (05/18 1208) Pulse Rate:  [66-85] 85 (05/18 1208) Resp:  [15-22] 22 (05/18 1208) BP: (98-136)/(41-83) 115/41 (05/18 1208) SpO2:  [90 %-99 %] 96 % (05/18 1208) Weight:  [155.2 kg (342 lb 1.6 oz)] 155.2 kg (342 lb 1.6 oz) (05/17 1624) Physical Exam  Constitutional: He is oriented to person, place, and time. Morbidly obese, lying in bed  HENT: anicteric Mouth/Throat: Oropharynx is clear and moist. No oropharyngeal exudate.  Cardiovascular: Normal rate, regular rhythm and normal heart sounds. Pulmonary/Chest: Effort normal and breath sounds normal. No respiratory distress. He has no wheezes.  Abdominal: Soft. Bowel sounds are normal. He exhibits no distension. There is no tenderness.  Lymphadenopathy: He has no cervical adenopathy.  Neurological: He is alert and oriented to person, place, and time.  Skin: R ant knee with approx 4 cm open area packed with gauze. There is no purulence, no odor. Only mild ttp Psychiatric: He has a normal mood and affect. His behavior is normal.   LABS: Results for orders placed or performed during the hospital encounter of 10/08/16 (from the past 48 hour(s))  Comprehensive metabolic  panel     Status: Abnormal   Collection Time: 10/08/16  4:29 PM  Result Value Ref Range   Sodium 126 (L) 135 - 145 mmol/L   Potassium 2.6 (LL) 3.5 - 5.1 mmol/L    Comment: CRITICAL RESULT CALLED TO, READ BACK BY AND VERIFIED WITH ANNA HOLT AT 1728 10/08/2016 BY TFK.    Chloride 79 (L) 101 - 111 mmol/L   CO2 34 (  H) 22 - 32 mmol/L   Glucose, Bld 196 (H) 65 - 99 mg/dL   BUN 51 (H) 6 - 20 mg/dL   Creatinine, Ser 2.32 (H) 0.61 - 1.24 mg/dL   Calcium 9.5 8.9 - 10.3 mg/dL   Total Protein 8.0 6.5 - 8.1 g/dL   Albumin 3.5 3.5 - 5.0 g/dL   AST 29 15 - 41 U/L   ALT 21 17 - 63 U/L   Alkaline Phosphatase 72 38 - 126 U/L   Total Bilirubin 0.7 0.3 - 1.2 mg/dL   GFR calc non Af Amer 27 (L) >60 mL/min   GFR calc Af Amer 31 (L) >60 mL/min    Comment: (NOTE) The eGFR has been calculated using the CKD EPI equation. This calculation has not been validated in all clinical situations. eGFR's persistently <60 mL/min signify possible Chronic Kidney Disease.    Anion gap 13 5 - 15  CBC with Differential     Status: Abnormal   Collection Time: 10/08/16  4:29 PM  Result Value Ref Range   WBC 9.6 3.8 - 10.6 K/uL   RBC 4.74 4.40 - 5.90 MIL/uL   Hemoglobin 11.3 (L) 13.0 - 18.0 g/dL   HCT 36.1 (L) 40.0 - 52.0 %   MCV 76.2 (L) 80.0 - 100.0 fL   MCH 23.8 (L) 26.0 - 34.0 pg   MCHC 31.3 (L) 32.0 - 36.0 g/dL   RDW 18.1 (H) 11.5 - 14.5 %   Platelets 449 (H) 150 - 440 K/uL   Neutrophils Relative % 79 %   Neutro Abs 7.5 (H) 1.4 - 6.5 K/uL   Lymphocytes Relative 10 %   Lymphs Abs 1.0 1.0 - 3.6 K/uL   Monocytes Relative 9 %   Monocytes Absolute 0.9 0.2 - 1.0 K/uL   Eosinophils Relative 2 %   Eosinophils Absolute 0.2 0 - 0.7 K/uL   Basophils Relative 0 %   Basophils Absolute 0.0 0 - 0.1 K/uL  Protime-INR     Status: None   Collection Time: 10/08/16  4:29 PM  Result Value Ref Range   Prothrombin Time 14.5 11.4 - 15.2 seconds   INR 1.12   Lactic acid, plasma     Status: Abnormal   Collection Time:  10/08/16  4:30 PM  Result Value Ref Range   Lactic Acid, Venous 3.0 (HH) 0.5 - 1.9 mmol/L    Comment: CRITICAL RESULT CALLED TO, READ BACK BY AND VERIFIED WITH ANNA HOLT AT 1728 10/08/2016 BY TFK.   Culture, blood (Routine x 2)     Status: None (Preliminary result)   Collection Time: 10/08/16  4:52 PM  Result Value Ref Range   Specimen Description BLOOD Blood Culture adequate volume    Special Requests BOTTLES DRAWN AEROBIC AND ANAEROBIC BLOOD LEFT ARM    Culture NO GROWTH < 24 HOURS    Report Status PENDING   Culture, blood (Routine x 2)     Status: None (Preliminary result)   Collection Time: 10/08/16  4:52 PM  Result Value Ref Range   Specimen Description BLOOD Blood Culture adequate volume    Special Requests      BOTTLES DRAWN AEROBIC AND ANAEROBIC RIGHT ANTECUBITAL   Culture NO GROWTH < 24 HOURS    Report Status PENDING   Urinalysis, Complete w Microscopic     Status: Abnormal   Collection Time: 10/08/16  4:52 PM  Result Value Ref Range   Color, Urine YELLOW (A) YELLOW   APPearance CLEAR (A) CLEAR  Specific Gravity, Urine 1.010 1.005 - 1.030   pH 6.0 5.0 - 8.0   Glucose, UA NEGATIVE NEGATIVE mg/dL   Hgb urine dipstick NEGATIVE NEGATIVE   Bilirubin Urine NEGATIVE NEGATIVE   Ketones, ur NEGATIVE NEGATIVE mg/dL   Protein, ur NEGATIVE NEGATIVE mg/dL   Nitrite NEGATIVE NEGATIVE   Leukocytes, UA NEGATIVE NEGATIVE   RBC / HPF 0-5 0 - 5 RBC/hpf   WBC, UA 0-5 0 - 5 WBC/hpf   Bacteria, UA NONE SEEN NONE SEEN   Squamous Epithelial / LPF 0-5 (A) NONE SEEN   Mucous PRESENT    Hyaline Casts, UA PRESENT   Lactic acid, plasma     Status: Abnormal   Collection Time: 10/08/16  7:50 PM  Result Value Ref Range   Lactic Acid, Venous 3.4 (HH) 0.5 - 1.9 mmol/L    Comment: CRITICAL RESULT CALLED TO, READ BACK BY AND VERIFIED WITH KALA KEEN AT 2112 10/08/2016 BY TFK.   MRSA PCR Screening     Status: Abnormal   Collection Time: 10/08/16 11:05 PM  Result Value Ref Range   MRSA by PCR  POSITIVE (A) NEGATIVE    Comment:        The GeneXpert MRSA Assay (FDA approved for NASAL specimens only), is one component of a comprehensive MRSA colonization surveillance program. It is not intended to diagnose MRSA infection nor to guide or monitor treatment for MRSA infections. RESULT CALLED TO, READ BACK BY AND VERIFIED WITH: Maxwell Marion ON 10/09/16 AT 0051 MNS   Basic metabolic panel     Status: Abnormal   Collection Time: 10/09/16  4:18 AM  Result Value Ref Range   Sodium 132 (L) 135 - 145 mmol/L   Potassium 3.4 (L) 3.5 - 5.1 mmol/L   Chloride 94 (L) 101 - 111 mmol/L   CO2 31 22 - 32 mmol/L   Glucose, Bld 146 (H) 65 - 99 mg/dL   BUN 38 (H) 6 - 20 mg/dL   Creatinine, Ser 1.45 (H) 0.61 - 1.24 mg/dL   Calcium 7.9 (L) 8.9 - 10.3 mg/dL   GFR calc non Af Amer 47 (L) >60 mL/min   GFR calc Af Amer 54 (L) >60 mL/min    Comment: (NOTE) The eGFR has been calculated using the CKD EPI equation. This calculation has not been validated in all clinical situations. eGFR's persistently <60 mL/min signify possible Chronic Kidney Disease.    Anion gap 7 5 - 15  CBC     Status: Abnormal   Collection Time: 10/09/16  4:18 AM  Result Value Ref Range   WBC 6.7 3.8 - 10.6 K/uL   RBC 4.04 (L) 4.40 - 5.90 MIL/uL   Hemoglobin 9.9 (L) 13.0 - 18.0 g/dL   HCT 31.1 (L) 40.0 - 52.0 %   MCV 77.1 (L) 80.0 - 100.0 fL   MCH 24.5 (L) 26.0 - 34.0 pg   MCHC 31.8 (L) 32.0 - 36.0 g/dL   RDW 17.4 (H) 11.5 - 14.5 %   Platelets 347 150 - 440 K/uL   No components found for: ESR, C REACTIVE PROTEIN MICRO: Recent Results (from the past 720 hour(s))  Culture, blood (routine x 2)     Status: None   Collection Time: 09/21/16  7:19 PM  Result Value Ref Range Status   Specimen Description BLOOD LEFT ANTECUBITAL  Final   Special Requests   Final    BOTTLES DRAWN AEROBIC AND ANAEROBIC Blood Culture adequate volume   Culture NO GROWTH 5 DAYS  Final   Report Status 09/26/2016 FINAL  Final  Culture, blood  (routine x 2)     Status: None   Collection Time: 09/21/16  7:19 PM  Result Value Ref Range Status   Specimen Description BLOOD LEFT HAND  Final   Special Requests   Final    BOTTLES DRAWN AEROBIC AND ANAEROBIC Blood Culture adequate volume   Culture NO GROWTH 5 DAYS  Final   Report Status 09/26/2016 FINAL  Final  Wound or Superficial Culture     Status: None   Collection Time: 09/21/16  7:19 PM  Result Value Ref Range Status   Specimen Description WOUND  Final   Special Requests NONE  Final   Gram Stain   Final    FEW WBC PRESENT, PREDOMINANTLY PMN FEW GRAM POSITIVE COCCI IN PAIRS Performed at [facility], [place] [address], [place], [place] [address]    Culture   Final    FEW PROTEUS MIRABILIS MODERATE METHICILLIN RESISTANT STAPHYLOCOCCUS AUREUS    Report Status 09/25/2016 FINAL  Final   Organism ID, Bacteria PROTEUS MIRABILIS  Final   Organism ID, Bacteria METHICILLIN RESISTANT STAPHYLOCOCCUS AUREUS  Final      Susceptibility   Methicillin resistant staphylococcus aureus - MIC*    CIPROFLOXACIN >=8 RESISTANT Resistant     ERYTHROMYCIN >=8 RESISTANT Resistant     GENTAMICIN <=0.5 SENSITIVE Sensitive     OXACILLIN >=4 RESISTANT Resistant     TETRACYCLINE <=1 SENSITIVE Sensitive     VANCOMYCIN <=0.5 SENSITIVE Sensitive     TRIMETH/SULFA <=10 SENSITIVE Sensitive     CLINDAMYCIN <=0.25 SENSITIVE Sensitive     RIFAMPIN <=0.5 SENSITIVE Sensitive     Inducible Clindamycin NEGATIVE Sensitive     * MODERATE METHICILLIN RESISTANT STAPHYLOCOCCUS AUREUS   Proteus mirabilis - MIC*    AMPICILLIN <=2 SENSITIVE Sensitive     CEFAZOLIN <=4 SENSITIVE Sensitive     CEFEPIME <=1 SENSITIVE Sensitive     CEFTAZIDIME <=1 SENSITIVE Sensitive     CEFTRIAXONE <=1 SENSITIVE Sensitive     CIPROFLOXACIN >=4 RESISTANT Resistant     GENTAMICIN >=16 RESISTANT Resistant     IMIPENEM 2 SENSITIVE Sensitive     TRIMETH/SULFA >=320 RESISTANT Resistant     AMPICILLIN/SULBACTAM <=2 SENSITIVE Sensitive      PIP/TAZO <=4 SENSITIVE Sensitive     * FEW PROTEUS MIRABILIS  MRSA PCR Screening     Status: None   Collection Time: 09/22/16  2:05 AM  Result Value Ref Range Status   MRSA by PCR NEGATIVE NEGATIVE Final    Comment:        The GeneXpert MRSA Assay (FDA approved for NASAL specimens only), is one component of a comprehensive MRSA colonization surveillance program. It is not intended to diagnose MRSA infection nor to guide or monitor treatment for MRSA infections.   Aerobic/Anaerobic Culture (surgical/deep wound)     Status: None   Collection Time: 09/23/16  2:30 AM  Result Value Ref Range Status   Specimen Description KNEE  Final   Special Requests Normal  Final   Gram Stain   Final    RARE WBC PRESENT,BOTH PMN AND MONONUCLEAR ABUNDANT GRAM NEGATIVE RODS ABUNDANT GRAM POSITIVE COCCI IN PAIRS    Culture   Final    ABUNDANT METHICILLIN RESISTANT STAPHYLOCOCCUS AUREUS WITHIN MIXED CULTURE NO ANAEROBES ISOLATED Performed at [facility], [place] [address] [place], [place] [address]    Report Status 09/28/2016 FINAL  Final   Organism ID, Bacteria  METHICILLIN RESISTANT STAPHYLOCOCCUS AUREUS  Final      Susceptibility   Methicillin resistant staphylococcus aureus - MIC*    CIPROFLOXACIN >=8 RESISTANT Resistant     ERYTHROMYCIN >=8 RESISTANT Resistant     GENTAMICIN <=0.5 SENSITIVE Sensitive     OXACILLIN >=4 RESISTANT Resistant     TETRACYCLINE <=1 SENSITIVE Sensitive     VANCOMYCIN <=0.5 SENSITIVE Sensitive     TRIMETH/SULFA <=10 SENSITIVE Sensitive     CLINDAMYCIN <=0.25 SENSITIVE Sensitive     RIFAMPIN <=0.5 SENSITIVE Sensitive     Inducible Clindamycin NEGATIVE Sensitive     * ABUNDANT METHICILLIN RESISTANT STAPHYLOCOCCUS AUREUS  Culture, blood (Routine x 2)     Status: None (Preliminary result)   Collection Time: 10/08/16  4:52 PM  Result Value Ref Range Status   Specimen Description BLOOD Blood Culture adequate volume  Final   Special Requests BOTTLES DRAWN  AEROBIC AND ANAEROBIC BLOOD LEFT ARM  Final   Culture NO GROWTH < 24 HOURS  Final   Report Status PENDING  Incomplete  Culture, blood (Routine x 2)     Status: None (Preliminary result)   Collection Time: 10/08/16  4:52 PM  Result Value Ref Range Status   Specimen Description BLOOD Blood Culture adequate volume  Final   Special Requests   Final    BOTTLES DRAWN AEROBIC AND ANAEROBIC RIGHT ANTECUBITAL   Culture NO GROWTH < 24 HOURS  Final   Report Status PENDING  Incomplete  MRSA PCR Screening     Status: Abnormal   Collection Time: 10/08/16 11:05 PM  Result Value Ref Range Status   MRSA by PCR POSITIVE (A) NEGATIVE Final    Comment:        The GeneXpert MRSA Assay (FDA approved for NASAL specimens only), is one component of a comprehensive MRSA colonization surveillance program. It is not intended to diagnose MRSA infection nor to guide or monitor treatment for MRSA infections. RESULT CALLED TO, READ BACK BY AND VERIFIED WITH: Maxwell Marion ON 10/09/16 AT 0051 MNS     IMAGING: Ct Knee Right Wo Contrast  Result Date: 10/08/2016 CLINICAL DATA:  Hypotension, lethargy and subjective fever at home for the past 2 days. Patient is being treated at wound clinic for right knee abscess/wound. EXAM: CT OF THE RIGHT KNEE WITHOUT CONTRAST TECHNIQUE: Multidetector CT imaging of the right knee was performed according to the standard protocol. Multiplanar CT image reconstructions were also generated. COMPARISON:  09/21/2016 FINDINGS: Bones/Joint/Cartilage Healing nondisplaced proximal fibular head fracture is again noted. Tricompartmental osteoarthritis of the knee. No new fracture is identified. No signify joint effusion is seen. No frank bone destruction to suggest osteomyelitis. Ligaments Suboptimally assessed by CT. Muscles and Tendons Nonacute Soft tissues There appears to have been some debridement with open wound noted of the soft tissues overlying the patellar tendon. There has been some  interval decompression of the fluid cavity noted in this region. However there does appear to be persistent complex fluid collection along the anterolateral aspect of the pretibial soft tissues incompletely imaged on current exam estimated at 10 x 7.4 x 3.3 cm. IMPRESSION: 1. Persistent pretibial soft tissue fluid collection measuring approximately 10 x 7.4 x 3.3 cm without underlying osseous involvement. The abscess/fluid collection slightly more cephalad to this region and overlying the patellar tendon has been partially evacuated with open wound/ulceration present. 2. Healing fibular head fracture without significant change. No new osseous abnormality. 3. Osteoarthritis of the knee. Electronically Signed   By: Ashley Royalty  M.D.   On: 10/08/2016 21:09   Ct Knee Right Wo Contrast  Result Date: 09/22/2016 CLINICAL DATA:  Large open wound below the knee EXAM: CT OF THE right KNEE WITHOUT CONTRAST TECHNIQUE: Multidetector CT imaging of the right knee was performed according to the standard protocol. Multiplanar CT image reconstructions were also generated. COMPARISON:  Radiograph 09/21/2016 FINDINGS: Bones/Joint/Cartilage Healing nondisplaced proximal fibular fracture with sclerotic margins. Moderate severe narrowing of the medial joint space compartment with subchondral sclerosis and bony spurring. Mild degenerative changes of the lateral compartment with bony spurring. Moderate to marked patellofemoral degenerative changes greater on the lateral side with medial and lateral osteophytes. Superior and inferior patellar spurring. No large suprapatellar effusion. No obvious calcified loose bodies within the joint. Ligaments Suboptimally assessed by CT. Muscles and Tendons Diffuse fatty atrophy of the distal thigh and proximal calf muscles. Quadriceps tendon grossly intact. Patellar tendon grossly intact. Soft tissues Large amount of soft tissue edema and skin thickening anterior to the patella, lower knee, and  proximal tibia and fibula. Soft tissue ulceration slightly inferior to the patella with scattered gas pockets in the anterior soft tissues. Large mildly hyperdense collection within the subcutaneous soft tissues anteriorly, this begins approximately at the inferior aspect of the patella and extends inferiorly to about the midshaft of the tibia. This measures approximately 20 more cm craniocaudad by 3.8 cm AP by 7.4 cm transverse. There is diffuse subcutaneous edema within the posterior as well. IMPRESSION: 1. Skin thickening and soft tissue ulceration anteriorly, inferior to the patella. Large mildly dense fluid collection anteriorly, spanning from the inferior aspect of the patella to the midshaft of the tibia, suspect that this may represent a hematoma or possible infected hemorrhagic fluid collection. Further evaluation limited without contrast. 2. Healing nondisplaced proximal fibular fracture. No periostitis or bony erosive changes. 3. Moderate severe arthritis of the right knee. Electronically Signed   By: Donavan Foil M.D.   On: 09/22/2016 01:14   Dg Chest Port 1 View  Result Date: 10/08/2016 CLINICAL DATA:  Hypotensive, cough EXAM: PORTABLE CHEST 1 VIEW COMPARISON:  08/24/2016 FINDINGS: Post sternotomy changes. Fracture through the first 3 sternal wires as before. Low lung volumes with elevated right diaphragm and right basilar atelectasis. No large effusion. Cardiomegaly. Subsegmental atelectasis left base. IMPRESSION: Elevated right diaphragm with subsegmental atelectasis at the bilateral bases. Cardiomegaly without edema or definitive infiltrate. Electronically Signed   By: Donavan Foil M.D.   On: 10/08/2016 17:03   Dg Knee Complete 4 Views Right  Result Date: 09/21/2016 CLINICAL DATA:  Right knee wound. EXAM: RIGHT KNEE - COMPLETE 4+ VIEW COMPARISON:  Radiographs of July 20, 2016. FINDINGS: Minimally displaced fracture is seen involving the proximal fibula. Severe narrowing of medial joint  space is noted with osteophyte formation. Moderate narrowing of patellofemoral space is noted with osteophyte formation. No joint effusion is noted. Soft tissue ulceration is seen anterior to the knee. IMPRESSION: Soft tissue ulceration or wound is noted anteriorly. Severe degenerative joint disease is noted. Minimally displaced proximal right fibular fracture. Electronically Signed   By: Marijo Conception, M.D.   On: 09/21/2016 19:47    Assessment:   [name] is a 71 y.o. male with R ant knee wound following a fall and development of a large hematoma. Admission 4/30-5/2 with large infected hematoma evacuated with cx with MRSA and Proteus. Dced on levo but was resistant. He is now readmitted with weakness and ARF, dehydration. His wbc was nml no fevers. Wound looks  relatively clean and is being managed by surgery. His CT shows some continued fluid collection but unclear to me if CT done before or after surgeon packed the dressing and broke up loculations.  He will need adequate abx coverage to help this heal and will need to follow with surgery and wound care. He has multiple drug allergies including pcn, tetracycline and bactrim.  Recommendations Continue vanco for MRSA Change meropenem to ceftriaxone for the proteus- he has tolerated this in past. After surgery feels wound is fully drained can dc on oral clindamycin and oral keflex for coverage of both the MRSA and Proteus. Would rec a 4 week course of abx therapy with follow up with me in 3-4 weeks.   Thank you very much for allowing me to participate in the care of this patient. Please call with questions.   Cheral Marker. Ola Spurr, MD

## 2016-10-09 NOTE — NC FL2 (Signed)
[place] LEVEL OF CARE SCREENING TOOL     IDENTIFICATION  Patient Name: [name] Birthdate: Jul 20, 1945 Sex: male Admission Date (Current Location): 10/08/2016  [facility] and [place] Number:  Engineering geologist and Address:  [facility], [address] [place], [place] [address]      Provider Number: [id number]  Attending Physician Name and Address:  Max Sane, MD  Relative Name and Phone Number:       Current Level of Care: Hospital Recommended Level of Care: [place] Prior Approval Number:    Date Approved/Denied:   PASRR Number:    Discharge Plan: SNF    Current Diagnoses: Patient Active Problem List   Diagnosis Date Noted  . Abscess 10/08/2016  . Abscess of right leg excluding foot   . Cellulitis 09/21/2016  . AKI (acute kidney injury) ([place]) 08/27/2016  . Demand ischemia ([place]) 08/27/2016  . Elevated troponin 08/27/2016  . 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 DVT (deep venous thrombosis) ([place]) 08/27/2016  . Pressure injury of skin 08/24/2016  . Pulmonary embolism ([place]) 08/23/2016  . SIRS (systemic inflammatory response syndrome) ([place]) 08/23/2016  . Acute on chronic renal failure ([place]) 08/23/2016  . Acute on chronic respiratory failure ([place]) 08/23/2016  . Lactic acidosis 08/23/2016  . Electrolyte imbalance 08/23/2016  . Current use of proton pump inhibitor 08/23/2016  . OSA (obstructive sleep apnea) 08/23/2016  . History of bipolar disorder 08/23/2016  . History of gastric ulcer 08/23/2016  . History of asthma 08/23/2016  . History of chronic pain 08/23/2016  . Acute pulmonary embolism ([place]) 08/23/2016  . Chest pain 07/14/2016  . UTI (urinary tract infection) 04/27/2016  . CAD (coronary artery disease) 04/27/2016  . Diabetes ([place]) 04/27/2016  . HTN (hypertension) 04/27/2016  . Chronic diastolic CHF (congestive heart failure) ([place]) 04/27/2016  . CKD (chronic kidney disease), stage III 04/27/2016  .  Chest pain, rule out acute myocardial infarction 02/16/2016    Orientation RESPIRATION BLADDER Height & Weight     Self, Time, Situation, Place  Normal Incontinent Weight: (!) 342 lb 1.6 oz (155.2 kg) Height:  5\' 7"  (170.2 cm)  BEHAVIORAL SYMPTOMS/MOOD NEUROLOGICAL BOWEL NUTRITION STATUS   (none)  (none) Incontinent Diet (regular)  AMBULATORY STATUS COMMUNICATION OF NEEDS Skin   Limited Assist Verbally Surgical wounds                       Personal Care Assistance Level of Assistance  Bathing, Dressing Bathing Assistance: Limited assistance Feeding assistance: Limited assistance Dressing Assistance: Limited assistance     Functional Limitations Info   (no issues)          SPECIAL CARE FACTORS FREQUENCY  PT (By licensed PT)                    Contractures Contractures Info: Not present    Additional Factors Info  Code Status, Allergies Code Status Info: full Allergies Info: cephalosporings, dilaudid; tetracycline, hydrocodone, morphine; pcns' sulfa     Isolation Precautions Info:  (mrsa)     Current Medications (10/09/2016):  This is the current hospital active medication list Current Facility-Administered Medications  Medication Dose Route Frequency Provider Last Rate Last Dose  . albuterol (PROVENTIL) (2.5 MG/3ML) 0.083% nebulizer solution 2.5 mg  2.5 mg Inhalation Q4H PRN Vaughan Basta, MD      . aspirin chewable tablet 81 mg  81 mg Oral Daily Vaughan Basta, MD   81 mg at 10/09/16  2836  . buPROPion (WELLBUTRIN SR) 12 hr tablet 100 mg  100 mg Oral Daily Vaughan Basta, MD   100 mg at 10/09/16 0844  . Chlorhexidine Gluconate Cloth 2 % PADS 6 each  6 each Topical Q0600 Max Sane, MD      . docusate sodium (COLACE) capsule 100 mg  100 mg Oral BID PRN Vaughan Basta, MD      . doxepin (SINEQUAN) capsule 10 mg  10 mg Oral QHS Vaughan Basta, MD   10 mg at 10/08/16 2301  . fluticasone (FLONASE) 50 MCG/ACT nasal spray  1 spray  1 spray Each Nare Daily Vaughan Basta, MD   1 spray at 10/09/16 0845  . gabapentin (NEURONTIN) capsule 400 mg  400 mg Oral Corwin Levins, MD   400 mg at 10/09/16 0117  . ipratropium-albuterol (DUONEB) 0.5-2.5 (3) MG/3ML nebulizer solution 3 mL  3 mL Inhalation Q4H PRN Vaughan Basta, MD   3 mL at 10/08/16 2323  . levothyroxine (SYNTHROID, LEVOTHROID) tablet 100 mcg  100 mcg Oral QAC breakfast Vaughan Basta, MD   100 mcg at 10/09/16 0844  . meclizine (ANTIVERT) tablet 25 mg  25 mg Oral BID PRN Vaughan Basta, MD      . meropenem (MERREM) 1 g in sodium chloride 0.9 % 100 mL IVPB  1 g Intravenous Q12H Hallaji, Sheema M, RPH 200 mL/hr at 10/09/16 1042 1 g at 10/09/16 1042  . mometasone-formoterol (DULERA) 100-5 MCG/ACT inhaler 2 puff  2 puff Inhalation BID Vaughan Basta, MD   2 puff at 10/09/16 0845  . montelukast (SINGULAIR) tablet 10 mg  10 mg Oral QHS Vaughan Basta, MD   10 mg at 10/08/16 2300  . mupirocin ointment (BACTROBAN) 2 % 1 application  1 application Nasal BID Max Sane, MD      . pantoprazole (PROTONIX) EC tablet 40 mg  40 mg Oral Daily Vaughan Basta, MD   40 mg at 10/09/16 0844  . potassium chloride 10 mEq in 100 mL IVPB  10 mEq Intravenous Q1H PRN Harvest Dark, MD   Stopped at 10/09/16 0018  . potassium chloride SA (K-DUR,KLOR-CON) CR tablet 20 mEq  20 mEq Oral BID Vaughan Basta, MD   20 mEq at 10/09/16 0844  . rivaroxaban (XARELTO) tablet 20 mg  20 mg Oral Q breakfast Vaughan Basta, MD   20 mg at 10/09/16 0844  . senna-docusate (Senokot-S) tablet 2 tablet  2 tablet Oral Daily Vaughan Basta, MD      . sodium chloride 0.9 % bolus 1,000 mL  1,000 mL Intravenous Once Harvest Dark, MD       And  . sodium chloride 0.9 % bolus 1,000 mL  1,000 mL Intravenous Once Harvest Dark, MD      . vancomycin (VANCOCIN) 1,750 mg in sodium chloride 0.9 % 500 mL IVPB  1,750 mg Intravenous  Q24H Hallaji, Sheema M, RPH   Stopped at 10/09/16 0400  . ziprasidone (GEODON) capsule 80 mg  80 mg Oral BID WC Lance Coon, MD   80 mg at 10/09/16 6294     Discharge Medications: Please see discharge summary for a list of discharge medications.  Relevant Imaging Results:  Relevant Lab Results:   Additional Information ss: [id number]  Shela Leff, LCSW
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
## 2016-10-09 NOTE — Progress Notes (Signed)
Inpatient Diabetes Program Recommendations  AACE/ADA: New Consensus Statement on Inpatient Glycemic Control (2015)  Target Ranges:  Prepandial:   less than 140 mg/dL      Peak postprandial:   less than 180 mg/dL (1-2 hours)      Critically ill patients:  140 - 180 mg/dL  Results for ARDIT, DANH (MRN [id number]) as of 10/09/2016 09:13  Ref. Range 10/08/2016 16:29 10/09/2016 04:18  Glucose Latest Ref Range: 65 - 99 mg/dL 196 (H) 146 (H)   Results for BROXTON, BROADY (MRN [id number]) as of 10/09/2016 09:13  Ref. Range 10/08/2016 15:33  Glucose-Capillary Latest Ref Range: 65 - 99 mg/dL 229 (H)  Results for AJDIN, MACKE (MRN [id number]) as of 10/09/2016 09:13  Ref. Range 08/24/2016 10:48  Hemoglobin A1C Latest Ref Range: 4.8 - 5.6 % 6.8 (H)   Review of Glycemic Control  Diabetes history: DM2 Outpatient Diabetes medications: None Current orders for Inpatient glycemic control: None  Inpatient Diabetes Program Recommendations:  Correction (SSI): While inpatient, please consider ordering CBGs with Novolog correction scale ACHS. Diet: Please consider discontinuing Regular diet and ordering Carb Modified diet.  Thanks, Barnie Alderman, RN, MSN, CDE Diabetes Coordinator Inpatient Diabetes Program [phone] (Team Pager from 8am to 5pm)

## 2016-10-10 LAB — CBC
HCT: 32.9 % — ABNORMAL LOW (ref 40.0–52.0)
HEMOGLOBIN: 10.3 g/dL — AB (ref 13.0–18.0)
MCH: 24.2 pg — ABNORMAL LOW (ref 26.0–34.0)
MCHC: 31.3 g/dL — AB (ref 32.0–36.0)
MCV: 77.1 fL — ABNORMAL LOW (ref 80.0–100.0)
Platelets: 353 10*3/uL (ref 150–440)
RBC: 4.26 MIL/uL — AB (ref 4.40–5.90)
RDW: 17.9 % — ABNORMAL HIGH (ref 11.5–14.5)
WBC: 7.1 10*3/uL (ref 3.8–10.6)

## 2016-10-10 LAB — GLUCOSE, CAPILLARY
GLUCOSE-CAPILLARY: 169 mg/dL — AB (ref 65–99)
GLUCOSE-CAPILLARY: 157 mg/dL — AB (ref 65–99)
Glucose-Capillary: 148 mg/dL — ABNORMAL HIGH (ref 65–99)
Glucose-Capillary: 218 mg/dL — ABNORMAL HIGH (ref 65–99)
Glucose-Capillary: 145 mg/dL — ABNORMAL HIGH (ref 65–99)

## 2016-10-10 LAB — BASIC METABOLIC PANEL
ANION GAP: 8 (ref 5–15)
BUN: 23 mg/dL — ABNORMAL HIGH (ref 6–20)
CHLORIDE: 94 mmol/L — AB (ref 101–111)
CO2: 33 mmol/L — ABNORMAL HIGH (ref 22–32)
CREATININE: 1 mg/dL (ref 0.61–1.24)
Calcium: 9.1 mg/dL (ref 8.9–10.3)
GFR calc non Af Amer: >60 mL/min (ref >60)
Glucose, Bld: 137 mg/dL — ABNORMAL HIGH (ref 65–99)
POTASSIUM: 3.3 mmol/L — AB (ref 3.5–5.1)
SODIUM: 135 mmol/L (ref 135–145)

## 2016-10-10 LAB — C-REACTIVE PROTEIN

## 2016-10-10 LAB — SEDIMENTATION RATE: SED RATE: 33 mm/h — AB (ref 0–20)

## 2016-10-10 NOTE — Progress Notes (Addendum)
Pharmacy Antibiotic Note  STEPFON [name] is a 71 y.o. male admitted on 10/08/2016 with infection and abscess on right knee tha continues to get worse. Patient had  I & D done 2 weeks ago and sent home on levaquin.   Pharmacy has been consulted for meropenem and vancomycin dosing  Plan: Continue vancomycin 750 mg iv q 12 h for now.  With dramatic improvement in SCr, will move vancomycin trough up to tonight to assess therapy. It is possible that the patient will require a larger dose.   Merrem: Change to 1 g IV q8 hours.   Height: 5\' 7"  (170.2 cm) Weight: (!) 342 lb 1.6 oz (155.2 kg) IBW/kg (Calculated) : 66.1  Temp (24hrs), Avg:98.4 F (36.9 C), Min:98.2 F (36.8 C), Max:98.5 F (36.9 C)   Recent Labs Lab 10/08/16 1629 10/08/16 1630 10/08/16 1950 10/09/16 0418 10/10/16 0501  WBC 9.6  --   --  6.7 7.1  CREATININE 2.32*  --   --  1.45* 1.00  LATICACIDVEN  --  3.0* 3.4*  --   --     Estimated Creatinine Clearance: 97.5 mL/min (by C-G formula based on SCr of 1 mg/dL).    Allergies  Allergen Reactions  . Dilaudid [Hydromorphone Hcl] Other (See Comments)    "CANNOT HEAR, CANNOT THINK"    . Hydrocodone-Acetaminophen Nausea And Vomiting  . Morphine And Related Other (See Comments)    Patient was told by a doctor that this "would kill" him  . Tetracycline Other (See Comments)    Patient was told by a doctor that this "would kill" him  . Tetracyclines & Related Other (See Comments)    Patient doesn't recall the reaction  . Penicillins Rash    Has patient had a PCN reaction causing immediate rash, facial/tongue/throat swelling, SOB or lightheadedness with hypotension: Yes Has patient had a PCN reaction causing severe rash involving mucus membranes or skin necrosis: No Has patient had a PCN reaction that required hospitalization No Has patient had a PCN reaction occurring within the last 10 years: No If all of the above answers are "NO", then may proceed with Cephalosporin  use.   . Sulfa Antibiotics Rash    Antimicrobials this admission: 5/17 vancomycin>>  5/17 meropenem >> 5/18 Ceftriaxone 5/18 >>  Dose adjustments this admission: 5/20 00:00 vanc level 14. Changed to 1 gram q 12 hours. Level before 4th new dose.   Microbiology results: 5/17 BCx: NGTD 5/17 UCx: 30 K Providencia 5/17 MRSA PCR: positive Knee culture: MRSA   Thank you for allowing pharmacy to be a part of this patient's care.  Napoleon Form, PharmD, BCPS Clinical Pharmacist 10/10/2016 12:19 PM

## 2016-10-10 NOTE — Progress Notes (Signed)
CC: Leg pain Subjective: Patient is much more awake today. He states his pain is near the wound. Denies fevers or chills. Objective: Vital signs in last 24 hours: Temp:  [98 F (36.7 C)-98.5 F (36.9 C)] 98.5 F (36.9 C) (05/19 0415) Pulse Rate:  [81-98] 81 (05/19 0415) Resp:  [19-22] 19 (05/19 0415) BP: (115-135)/(41-56) 134/56 (05/19 0415) SpO2:  [94 %-99 %] 99 % (05/19 0415) Last BM Date: 10/10/16  Intake/Output from previous day: 05/18 0701 - 05/19 0700 In: 710 [P.O.:360; IV Piggyback:350] Out: -  Intake/Output this shift: No intake/output data recorded.  Physical exam:  Right leg wound with no erythema minimal drainage nonpurulent  Lab Results: CBC   Recent Labs  10/09/16 0418 10/10/16 0501  WBC 6.7 7.1  HGB 9.9* 10.3*  HCT 31.1* 32.9*  PLT 347 353   BMET  Recent Labs  10/09/16 0418 10/10/16 0501  NA 132* 135  K 3.4* 3.3*  CL 94* 94*  CO2 31 33*  GLUCOSE 146* 137*  BUN 38* 23*  CREATININE 1.45* 1.00  CALCIUM 7.9* 9.1   PT/INR  Recent Labs  10/08/16 1629  LABPROT 14.5  INR 1.12   ABG No results for input(s): PHART, HCO3 in the last 72 hours.  Invalid input(s): PCO2, PO2  Studies/Results: Ct Knee Right Wo Contrast  Result Date: 10/08/2016 CLINICAL DATA:  Hypotension, lethargy and subjective fever at home for the past 2 days. Patient is being treated at wound clinic for right knee abscess/wound. EXAM: CT OF THE RIGHT KNEE WITHOUT CONTRAST TECHNIQUE: Multidetector CT imaging of the right knee was performed according to the standard protocol. Multiplanar CT image reconstructions were also generated. COMPARISON:  09/21/2016 FINDINGS: Bones/Joint/Cartilage Healing nondisplaced proximal fibular head fracture is again noted. Tricompartmental osteoarthritis of the knee. No new fracture is identified. No signify joint effusion is seen. No frank bone destruction to suggest osteomyelitis. Ligaments Suboptimally assessed by CT. Muscles and Tendons Nonacute  Soft tissues There appears to have been some debridement with open wound noted of the soft tissues overlying the patellar tendon. There has been some interval decompression of the fluid cavity noted in this region. However there does appear to be persistent complex fluid collection along the anterolateral aspect of the pretibial soft tissues incompletely imaged on current exam estimated at 10 x 7.4 x 3.3 cm. IMPRESSION: 1. Persistent pretibial soft tissue fluid collection measuring approximately 10 x 7.4 x 3.3 cm without underlying osseous involvement. The abscess/fluid collection slightly more cephalad to this region and overlying the patellar tendon has been partially evacuated with open wound/ulceration present. 2. Healing fibular head fracture without significant change. No new osseous abnormality. 3. Osteoarthritis of the knee. Electronically Signed   By: Ashley Royalty M.D.   On: 10/08/2016 21:09   Dg Chest Port 1 View  Result Date: 10/08/2016 CLINICAL DATA:  Hypotensive, cough EXAM: PORTABLE CHEST 1 VIEW COMPARISON:  08/24/2016 FINDINGS: Post sternotomy changes. Fracture through the first 3 sternal wires as before. Low lung volumes with elevated right diaphragm and right basilar atelectasis. No large effusion. Cardiomegaly. Subsegmental atelectasis left base. IMPRESSION: Elevated right diaphragm with subsegmental atelectasis at the bilateral bases. Cardiomegaly without edema or definitive infiltrate. Electronically Signed   By: Donavan Foil M.D.   On: 10/08/2016 17:03    Anti-infectives: Anti-infectives    Start     Dose/Rate Route Frequency Ordered Stop   10/09/16 1800  meropenem (MERREM) 1 g in sodium chloride 0.9 % 100 mL IVPB  Status:  Discontinued  1 g 200 mL/hr over 30 Minutes Intravenous Every 8 hours 10/09/16 1209 10/09/16 1556   10/09/16 1700  cefTRIAXone (ROCEPHIN) 1 g in dextrose 5 % 50 mL IVPB  Status:  Discontinued     1 g 100 mL/hr over 30 Minutes Intravenous Every 24 hours  10/09/16 1602 10/09/16 1612   10/09/16 1700  cefTRIAXone (ROCEPHIN) 2 g in dextrose 5 % 50 mL IVPB     2 g 100 mL/hr over 30 Minutes Intravenous Every 24 hours 10/09/16 1612     10/09/16 1230  vancomycin (VANCOCIN) IVPB 750 mg/150 ml premix     750 mg 150 mL/hr over 60 Minutes Intravenous Every 12 hours 10/09/16 1209     10/09/16 0200  vancomycin (VANCOCIN) 1,750 mg in sodium chloride 0.9 % 500 mL IVPB  Status:  Discontinued     1,750 mg 250 mL/hr over 120 Minutes Intravenous Every 24 hours 10/08/16 2013 10/09/16 1209   10/08/16 2200  meropenem (MERREM) 1 g in sodium chloride 0.9 % 100 mL IVPB  Status:  Discontinued     1 g 200 mL/hr over 30 Minutes Intravenous Every 8 hours 10/08/16 2013 10/08/16 2017   10/08/16 2200  meropenem (MERREM) 1 g in sodium chloride 0.9 % 100 mL IVPB  Status:  Discontinued     1 g 200 mL/hr over 30 Minutes Intravenous Every 12 hours 10/08/16 2017 10/09/16 1209   10/08/16 1645  levofloxacin (LEVAQUIN) IVPB 750 mg     750 mg 100 mL/hr over 90 Minutes Intravenous  Once 10/08/16 1637 10/08/16 1855   10/08/16 1645  aztreonam (AZACTAM) 2 g in dextrose 5 % 50 mL IVPB     2 g 100 mL/hr over 30 Minutes Intravenous  Once 10/08/16 1637 10/08/16 1815   10/08/16 1645  vancomycin (VANCOCIN) IVPB 1000 mg/200 mL premix     1,000 mg 200 mL/hr over 60 Minutes Intravenous  Once 10/08/16 1637 10/09/16 0018      Assessment/Plan:  Recommend continuing IV antibiotics for now but could likely go home in the next day or 2. Wound is healing well and packing is appropriate.  Florene Glen, MD, FACS  10/10/2016

## 2016-10-10 NOTE — Progress Notes (Signed)
[facility] NAME: Fredy Gladu    MR#:  [id number]  DATE OF BIRTH:  [birth year]  SUBJECTIVE:  Not much complaint, s/p surgery, some pain in leg. REVIEW OF SYSTEMS:  CONSTITUTIONAL: positive for fever, fatigue or weakness.  EYES: No blurred or double vision.  EARS, NOSE, AND THROAT: No tinnitus or ear pain.  RESPIRATORY: No cough, shortness of breath, wheezing or hemoptysis.  CARDIOVASCULAR: No chest pain, orthopnea, edema.  GASTROINTESTINAL: No nausea, vomiting, diarrhea or abdominal pain.  GENITOURINARY: No dysuria, hematuria.  ENDOCRINE: No polyuria, nocturia,  HEMATOLOGY: No anemia, easy bruising or bleeding SKIN: No rash or lesion. MUSCULOSKELETAL: right knee joint pain or arthritis.   NEUROLOGIC: No tingling, numbness, weakness.  PSYCHIATRY: No anxiety or depression.   Tolerating Diet: yes DRUG ALLERGIES:   Allergies  Allergen Reactions  . Dilaudid [Hydromorphone Hcl] Other (See Comments)    "CANNOT HEAR, CANNOT THINK"    . Hydrocodone-Acetaminophen Nausea And Vomiting  . Morphine And Related Other (See Comments)    Patient was told by a doctor that this "would kill" him  . Tetracycline Other (See Comments)    Patient was told by a doctor that this "would kill" him  . Tetracyclines & Related Other (See Comments)    Patient doesn't recall the reaction  . Penicillins Rash    Has patient had a PCN reaction causing immediate rash, facial/tongue/throat swelling, SOB or lightheadedness with hypotension: Yes Has patient had a PCN reaction causing severe rash involving mucus membranes or skin necrosis: No Has patient had a PCN reaction that required hospitalization No Has patient had a PCN reaction occurring within the last 10 years: No If all of the above answers are "NO", then may proceed with Cephalosporin use.   . Sulfa Antibiotics Rash    VITALS:  Blood pressure (!) 170/64, pulse (!) 120, temperature 98.6 F (37  C), temperature source Oral, resp. rate 19, height 5\' 7"  (1.702 m), weight (!) 155.2 kg (342 lb 1.6 oz), SpO2 95 %.  PHYSICAL EXAMINATION:  GENERAL:  71 y.o.-year-old patient lying in the bed with no acute distress.  EYES: Pupils equal, round, reactive to light and accommodation. No scleral icterus. Extraocular muscles intact.  HEENT: Head atraumatic, normocephalic. Oropharynx and nasopharynx clear.  NECK:  Supple, no jugular venous distention. No thyroid enlargement, no tenderness.  LUNGS: Normal breath sounds bilaterally, no wheezing, rales,rhonchi or crepitation. No use of accessory muscles of respiration.  CARDIOVASCULAR: S1, S2 normal. No murmurs, rubs, or gallops.  ABDOMEN: Soft, nontender, nondistended. Bowel sounds present. No organomegaly or mass.  EXTREMITIES: No pedal edema, cyanosis, or clubbing. Right knee swelling and painful.  NEUROLOGIC: Cranial nerves II through XII are intact. Muscle strength 4/5 in all extremities. Sensation intact. Gait not checked.  PSYCHIATRIC: The patient is alert and oriented x 3.  SKIN: No obvious rash, lesion, or ulcer.  LABORATORY PANEL:   CBC  Recent Labs Lab 10/10/16 0501  WBC 7.1  HGB 10.3*  HCT 32.9*  PLT 353   ------------------------------------------------------------------------------------------------------------------  Chemistries   Recent Labs Lab 10/08/16 1629  10/10/16 0501  NA 126*  < > 135  K 2.6*  < > 3.3*  CL 79*  < > 94*  CO2 34*  < > 33*  GLUCOSE 196*  < > 137*  BUN 51*  < > 23*  CREATININE 2.32*  < > 1.00  CALCIUM 9.5  < > 9.1  AST 29  --   --  ALT 21  --   --   ALKPHOS 72  --   --   BILITOT 0.7  --   --   < > = values in this interval not displayed. ------------------------------------------------------------------------------------------------------------------  Cardiac Enzymes No results for input(s): TROPONINI in the last 168  hours. ------------------------------------------------------------------------------------------------------------------  RADIOLOGY:  Ct Knee Right Wo Contrast  Result Date: 10/08/2016 CLINICAL DATA:  Hypotension, lethargy and subjective fever at home for the past 2 days. Patient is being treated at wound clinic for right knee abscess/wound. EXAM: CT OF THE RIGHT KNEE WITHOUT CONTRAST TECHNIQUE: Multidetector CT imaging of the right knee was performed according to the standard protocol. Multiplanar CT image reconstructions were also generated. COMPARISON:  09/21/2016 FINDINGS: Bones/Joint/Cartilage Healing nondisplaced proximal fibular head fracture is again noted. Tricompartmental osteoarthritis of the knee. No new fracture is identified. No signify joint effusion is seen. No frank bone destruction to suggest osteomyelitis. Ligaments Suboptimally assessed by CT. Muscles and Tendons Nonacute Soft tissues There appears to have been some debridement with open wound noted of the soft tissues overlying the patellar tendon. There has been some interval decompression of the fluid cavity noted in this region. However there does appear to be persistent complex fluid collection along the anterolateral aspect of the pretibial soft tissues incompletely imaged on current exam estimated at 10 x 7.4 x 3.3 cm. IMPRESSION: 1. Persistent pretibial soft tissue fluid collection measuring approximately 10 x 7.4 x 3.3 cm without underlying osseous involvement. The abscess/fluid collection slightly more cephalad to this region and overlying the patellar tendon has been partially evacuated with open wound/ulceration present. 2. Healing fibular head fracture without significant change. No new osseous abnormality. 3. Osteoarthritis of the knee. Electronically Signed   By: Ashley Royalty M.D.   On: 10/08/2016 21:09     ASSESSMENT AND PLAN:  71 year old morbidly obese man with OSA who was sent by wound care physician for right knee  Infection/abscess  * Sepsis: Present on admission   Right knee abscess - Continue IV vancomycin and ceftriaxone for culture-positive MRSA and Proteus as per ID physician - daily packing of entire wound cavity to prevent loculation and re-accumulation of hematoma - Appreciate surgical input, no surgical intervention indicated at this time. - Wound care nurse consult - ID suggested ABx for 3 weeks.  * Ac renal failure   Hold lasix, hold BP meds  improving with  IV fluids. - Creatinine down to 1.0 from 2.3  * Hypokalemia Repleted and recheck  * PE-diagnosed in April 2018   On xarelto, cont for now.  * Hypothyroidism   Cont levothyroxine  * COPD   No exacerbation.  * OSA: CPAP  * Diabetes: Continue ADA diet with sliding scale insulin  * Chronic anemia on iron  * Hyperlipidemia: Continue statin  * Depression: Continue Wellbutrin  * Essential hypertension: Continue Norvasc, isosorbide and metoprolol  * Chronic Diastolic heart failure: No signs of exacerbation    CODE STATUS: FULL  TOTAL TIME TAKING CARE OF THIS PATIENT: 28 minutes.   POSSIBLE D/C 1-2 days, DEPENDING ON CLINICAL CONDITION.   Vaughan Basta M.D on 10/10/2016 at 8:39 PM  Between 7am to 6pm - Pager - [phone] After 6pm go to [email or web address] - password [email or web address] [facility]  Office  [phone]  CC: Primary care physician; Jodi Marble, MD  Note: This dictation was prepared with Dragon dictation along with smaller phrase technology. Any transcriptional errors that result from this process are unintentional.

## 2016-10-10 NOTE — Progress Notes (Addendum)
BERTHEL, BAGNALL ([id number]) Visit Report for 10/08/2016 Arrival Information Details Patient Name: [name], [name]. Date of Service: 10/08/2016 2:45 PM Medical Record Number: [id number] Patient Account Number: [id number] Date of Birth/Sex: 03/23/1946 (71 y.o. Male) Treating RN: Cornell Barman Primary Care Shavonte Zhao: [name] Dupre Other Clinician: Referring Keilly Fatula: [name] Dupre Treating Riven Mabile/Extender: Frann Rider in Treatment: 7 Visit Information History Since Last Visit Added or deleted any medications: No Patient Arrived: Wheel Chair Any new allergies or adverse reactions: No Arrival Time: 14:29 Had a fall or experienced change in No activities of daily living that may affect Accompanied By: self risk of falls: Transfer Assistance: Hoyer Lift Signs or symptoms of abuse/neglect since last No Patient Identification Verified: Yes visito Secondary Verification Process Yes Hospitalized since last visit: No Completed: Has Dressing in Place as Prescribed: Yes Patient Requires Transmission-Based No Pain Present Now: No Precautions: Patient Has Alerts: No Electronic Signature(s) Signed: 10/08/2016 5:15:52 PM By: Gretta Cool, RN, BSN, Kim RN, BSN Entered By: Gretta Cool, RN, BSN, Kim on 10/08/2016 14:43:49 [name], [name] ([id number]) -------------------------------------------------------------------------------- Clinic Level of Care Assessment Details Patient Name: [name] Gathers T. Date of Service: 10/08/2016 2:45 PM Medical Record Number: [id number] Patient Account Number: [id number] Date of Birth/Sex: 17-Nov-1945 (71 y.o. Male) Treating RN: Cornell Barman Primary Care Kenadi Miltner: [name] Dupre Other Clinician: Referring Hudson Lehmkuhl: [name] Dupre Treating Kanitra Purifoy/Extender: Frann Rider in Treatment: 7 Clinic Level of Care Assessment Items TOOL 4 Quantity Score []  - Use when only an EandM is performed on FOLLOW-UP visit 0 ASSESSMENTS - Nursing Assessment /  Reassessment []  - Reassessment of Co-morbidities (includes updates in patient status) 0 X - Reassessment of Adherence to Treatment Plan 1 5 ASSESSMENTS - Wound and Skin Assessment / Reassessment X - Simple Wound Assessment / Reassessment - one wound 1 5 []  - Complex Wound Assessment / Reassessment - multiple wounds 0 []  - Dermatologic / Skin Assessment (not related to wound area) 0 ASSESSMENTS - Focused Assessment []  - Circumferential Edema Measurements - multi extremities 0 []  - Nutritional Assessment / Counseling / Intervention 0 []  - Lower Extremity Assessment (monofilament, tuning fork, pulses) 0 []  - Peripheral Arterial Disease Assessment (using hand held doppler) 0 ASSESSMENTS - Ostomy and/or Continence Assessment and Care []  - Incontinence Assessment and Management 0 []  - Ostomy Care Assessment and Management (repouching, etc.) 0 PROCESS - Coordination of Care X - Simple Patient / Family Education for ongoing care 1 15 []  - Complex (extensive) Patient / Family Education for ongoing care 0 X - Staff obtains Programmer, systems, Records, Test Results / Process Orders 1 10 []  - Staff telephones HHA, Nursing Homes / Clarify orders / etc 0 []  - Routine Transfer to another Facility (non-emergent condition) 0 Koons, Marguerite T. ([id number]) []  - Routine Hospital Admission (non-emergent condition) 0 []  - New Admissions / Biomedical engineer / Ordering NPWT, Apligraf, etc. 0 []  - Emergency Hospital Admission (emergent condition) 0 X - Simple Discharge Coordination 1 10 []  - Complex (extensive) Discharge Coordination 0 PROCESS - Special Needs []  - Pediatric / Minor Patient Management 0 []  - Isolation Patient Management 0 []  - Hearing / Language / Visual special needs 0 []  - Assessment of Community assistance (transportation, D/C planning, etc.) 0 []  - Additional assistance / Altered mentation 0 []  - Support Surface(s) Assessment (bed, cushion, seat, etc.) 0 INTERVENTIONS - Wound Cleansing /  Measurement X - Simple Wound Cleansing - one wound 1 5 []  - Complex Wound Cleansing - multiple wounds 0 X - Wound Imaging (photographs - any  number of wounds) 1 5 []  - Wound Tracing (instead of photographs) 0 X - Simple Wound Measurement - one wound 1 5 []  - Complex Wound Measurement - multiple wounds 0 INTERVENTIONS - Wound Dressings []  - Small Wound Dressing one or multiple wounds 0 X - Medium Wound Dressing one or multiple wounds 1 15 []  - Large Wound Dressing one or multiple wounds 0 []  - Application of Medications - topical 0 []  - Application of Medications - injection 0 INTERVENTIONS - Miscellaneous []  - External ear exam 0 [name], [name] ([id number]) []  - Specimen Collection (cultures, biopsies, blood, body fluids, etc.) 0 []  - Specimen(s) / Culture(s) sent or taken to Lab for analysis 0 []  - Patient Transfer (multiple staff / Harrel Lemon Lift / Similar devices) 0 []  - Simple Staple / Suture removal (25 or less) 0 []  - Complex Staple / Suture removal (26 or more) 0 []  - Hypo / Hyperglycemic Management (close monitor of Blood Glucose) 0 []  - Ankle / Brachial Index (ABI) - do not check if billed separately 0 X - Vital Signs 1 5 Has the patient been seen at the hospital within the last three years: Yes Total Score: 80 Level Of Care: New/Established - Level 3 Electronic Signature(s) Signed: 10/08/2016 5:15:52 PM By: Gretta Cool, RN, BSN, Kim RN, BSN Entered By: Gretta Cool, RN, BSN, Kim on 10/08/2016 15:17:20 [name], [name] ([id number]) -------------------------------------------------------------------------------- Encounter Discharge Information Details Patient Name: [name] Gathers T. Date of Service: 10/08/2016 2:45 PM Medical Record Number: [id number] Patient Account Number: [id number] Date of Birth/Sex: 12-15-1945 (71 y.o. Male) Treating RN: Cornell Barman Primary Care Angeligue Bowne: [name] Dupre Other Clinician: Referring Mabeline Varas: [name] Dupre Treating Lou Loewe/Extender: Frann Rider in Treatment: 7 Encounter Discharge Information Items Schedule Follow-up Appointment: No Medication Reconciliation completed No and provided to Patient/Care Krystle Polcyn: Provided on Clinical Summary of Care: 10/08/2016 Form Type Recipient Paper Patient JE Electronic Signature(s) Signed: 10/08/2016 3:34:43 PM By: Ruthine Dose Entered By: Ruthine Dose on 10/08/2016 15:34:42 Carnegie, [name] ([id number]) -------------------------------------------------------------------------------- General Visit Notes Details Patient Name: [name] Gathers T. Date of Service: 10/08/2016 2:45 PM Medical Record Number: [id number] Patient Account Number: [id number] Date of Birth/Sex: 02/09/1946 (71 y.o. Male) Treating RN: Cornell Barman Primary Care Narciso Stoutenburg: [name] Dupre Other Clinician: Referring Umair Rosiles: [name] Dupre Treating Cap Massi/Extender: Frann Rider in Treatment: 7 Notes Upon arriving to the clinic today patient who is usually an easy 1 person pivot from the wheelchair to the treatment chair required 3 of [place] to help him stand. Once standing the patient began turning to sit into the treatment chair and his knees buckled. Patient was lowered to the floor. The patient was not injured. Once lowered to the floor we used the lift to raise the patient from the floor into the treatment chair. 14:22 Patients vitals were checked. Temperature 97.5; pulse 63; BP 87/41. MD Notified of patient BP. MD came in to check on the patient. Patient's blood pressure was checked again 15:02 BP Recheck (patient laying flat) 122/49. Pulse 76. 15:12 80/34 pulse 76. MD Notified. EMS for non-emergent pick up patient. 15:20 94/42, pulse 71 O2 (Room Air) 94. 15:36 EMS called for Emergent transport: BP 79/24 Heart rate 72; O2 94. Blood sugar 229. Patient in and out of consciousness and sweating. Electronic Signature(s) Signed: 10/08/2016 4:00:33 PM By: Gretta Cool, RN, BSN, Kim RN, BSN Entered By: Gretta Cool,  RN, BSN, Kim on 10/08/2016 16:00:33 [name], [name] ([id number]) -------------------------------------------------------------------------------- Lower Extremity Assessment Details Patient Name: [name] Gathers T. Date of Service: 10/08/2016 2:45  PM Medical Record Number: [id number] Patient Account Number: [id number] Date of Birth/Sex: 02-17-1946 (71 y.o. Male) Treating RN: Cornell Barman Primary Care Sherryann Frese: [name] Dupre Other Clinician: Referring Aiyah Scarpelli: [name] Dupre Treating Cass Edinger/Extender: Frann Rider in Treatment: 7 Vascular Assessment Pulses: Dorsalis Pedis Palpable: [Right:Yes] Posterior Tibial Extremity colors, hair growth, and conditions: Extremity Color: [Right:Normal] Temperature of Extremity: [Right:Warm] Capillary Refill: [Right:< 3 seconds] Electronic Signature(s) Signed: 10/08/2016 5:15:52 PM By: Gretta Cool, RN, BSN, Kim RN, BSN Entered By: Gretta Cool, RN, BSN, Kim on 10/08/2016 14:51:34 [name], [name] ([id number]) -------------------------------------------------------------------------------- Multi Wound Chart Details Patient Name: [name] Gathers T. Date of Service: 10/08/2016 2:45 PM Medical Record Number: [id number] Patient Account Number: [id number] Date of Birth/Sex: [birth year] (71 y.o. Male) Treating RN: Cornell Barman Primary Care Akeelah Seppala: [name] Dupre Other Clinician: Referring Eryn Krejci: [name] Dupre Treating Khamil Lamica/Extender: Frann Rider in Treatment: 7 Vital Signs Height(in): 67 Pulse(bpm): 63 Weight(lbs): 357 Blood Pressure 87/41 (mmHg): Body Mass Index(BMI): 56 Temperature(F): 97.5 Respiratory Rate 16 (breaths/min): Photos: [1:No Photos] [N/A:N/A] Wound Location: [1:Right Knee - Anterior] [N/A:N/A] Wounding Event: [1:Pressure Injury] [N/A:N/A] Primary Etiology: [1:Diabetic Wound/Ulcer of the Lower Extremity] [N/A:N/A] Comorbid History: [1:Anemia, Asthma, Arrhythmia, Congestive Heart Failure, Coronary Artery  Disease, Hypertension, Type II Diabetes, Osteoarthritis] [N/A:N/A] Date Acquired: [1:07/23/2016] [N/A:N/A] Weeks of Treatment: [1:7] [N/A:N/A] Wound Status: [1:Open] [N/A:N/A] Measurements L x W x D 2x3.5x0.4 [N/A:N/A] (cm) Area (cm) : [1:5.498] [N/A:N/A] Volume (cm) : [1:2.199] [N/A:N/A] % Reduction in Area: [1:30.00%] [N/A:N/A] % Reduction in Volume: -180.10% [N/A:N/A] Starting Position 1 4 (o'clock): Ending Position 1 [1:10] (o'clock): Maximum Distance 1 2.7 (cm): Undermining: [1:Yes] [N/A:N/A] Classification: [1:Grade 1] [N/A:N/A] Exudate Amount: [1:Large] [N/A:N/A] Exudate Type: [1:Purulent] [N/A:N/A] Exudate Color: yellow, brown, green N/A N/A Wound Margin: Flat and Intact N/A N/A Granulation Amount: Large (67-100%) N/A N/A Necrotic Amount: Small (1-33%) N/A N/A Exposed Structures: Fat Layer (Subcutaneous N/A N/A Tissue) Exposed: Yes Fascia: No Tendon: No Muscle: No Joint: No Bone: No Epithelialization: None N/A N/A Periwound Skin Texture: Excoriation: No N/A N/A Induration: No Callus: No Crepitus: No Rash: No Scarring: No Periwound Skin Maceration: No N/A N/A Moisture: Dry/Scaly: No Periwound Skin Color: Erythema: Yes N/A N/A Atrophie Blanche: No Cyanosis: No Ecchymosis: No Hemosiderin Staining: No Mottled: No Pallor: No Rubor: No Erythema Location: Circumferential N/A N/A Temperature: No Abnormality N/A N/A Tenderness on Yes N/A N/A Palpation: Wound Preparation: Ulcer Cleansing: N/A N/A Rinsed/Irrigated with Saline Topical Anesthetic Applied: Other: Lidocaine 4% Treatment Notes Electronic Signature(s) Signed: 10/08/2016 3:16:53 PM By: Christin Fudge MD, FACS Entered By: Christin Fudge on 10/08/2016 15:16:52 [name], [name] ([id number]) -------------------------------------------------------------------------------- [place] Details Patient Name: [name] Gathers T. Date of Service: 10/08/2016 2:45 PM Medical Record Number:  [id number] Patient Account Number: [id number] Date of Birth/Sex: 1946-05-09 (71 y.o. Male) Treating RN: Cornell Barman Primary Care Zerek Litsey: [name] Dupre Other Clinician: Referring Aisha Greenberger: [name] Dupre Treating Christee Mervine/Extender: Frann Rider in Treatment: 7 Active Inactive ` Orientation to the Wound Care Program Nursing Diagnoses: Knowledge deficit related to the wound healing center program Goals: Patient/caregiver will verbalize understanding of the [place] Program Date Initiated: 08/14/2016 Target Resolution Date: 12/14/2016 Goal Status: Active Interventions: Provide education on orientation to the wound center Notes: ` Pressure Nursing Diagnoses: Knowledge deficit related to causes and risk factors for pressure ulcer development Knowledge deficit related to management of pressures ulcers Potential for impaired tissue integrity related to pressure, friction, moisture, and shear Goals: Patient will remain free from development of additional pressure ulcers Date Initiated: 08/14/2016 Target Resolution Date: 12/14/2016 Goal  Status: Active Patient will remain free of pressure ulcers Date Initiated: 08/14/2016 Target Resolution Date: 12/14/2016 Goal Status: Active Patient/caregiver will verbalize risk factors for pressure ulcer development Date Initiated: 08/14/2016 Target Resolution Date: 12/14/2016 Goal Status: Active Patient/caregiver will verbalize understanding of pressure ulcer management Date Initiated: 08/14/2016 Target Resolution Date: 12/14/2016 Goal Status: Active [name], [name] ([id number]) Interventions: Assess: immobility, friction, shearing, incontinence upon admission and as needed Assess offloading mechanisms upon admission and as needed Assess potential for pressure ulcer upon admission and as needed Provide education on pressure ulcers Treatment Activities: Patient referred for seating evaluation to ensure proper offloading :  08/14/2016 Pressure reduction/relief device ordered : 08/14/2016 Notes: ` Wound/Skin Impairment Nursing Diagnoses: Impaired tissue integrity Knowledge deficit related to ulceration/compromised skin integrity Goals: Patient/caregiver will verbalize understanding of skin care regimen Date Initiated: 08/14/2016 Target Resolution Date: 12/14/2016 Goal Status: Active Ulcer/skin breakdown will have a volume reduction of 30% by week 4 Date Initiated: 08/14/2016 Target Resolution Date: 12/14/2016 Goal Status: Active Ulcer/skin breakdown will have a volume reduction of 50% by week 8 Date Initiated: 08/14/2016 Target Resolution Date: 12/14/2016 Goal Status: Active Ulcer/skin breakdown will have a volume reduction of 80% by week 12 Date Initiated: 08/14/2016 Target Resolution Date: 12/14/2016 Goal Status: Active Ulcer/skin breakdown will heal within 14 weeks Date Initiated: 08/14/2016 Target Resolution Date: 12/14/2016 Goal Status: Active Interventions: Assess patient/caregiver ability to obtain necessary supplies Assess patient/caregiver ability to perform ulcer/skin care regimen upon admission and as needed Assess ulceration(s) every visit Provide education on ulcer and skin care Treatment Activities: Referred to DME Gladie Gravette for dressing supplies : 08/14/2016 Skin care regimen initiated : 08/14/2016 MORIAH, [name] ([id number]) Topical wound management initiated : 08/14/2016 Notes: Electronic Signature(s) Signed: 10/08/2016 5:15:52 PM By: Gretta Cool, RN, BSN, Kim RN, BSN Entered By: Gretta Cool, RN, BSN, Kim on 10/08/2016 15:14:47 [name], [name] ([id number]) -------------------------------------------------------------------------------- Pain Assessment Details Patient Name: [name] Gathers T. Date of Service: 10/08/2016 2:45 PM Medical Record Number: [id number] Patient Account Number: [id number] Date of Birth/Sex: 07/05/1945 (71 y.o. Male) Treating RN: Cornell Barman Primary Care Kieran Nachtigal: [name] Dupre Other Clinician: Referring Rasheena Talmadge: [name] Dupre Treating Xenia Nile/Extender: Frann Rider in Treatment: 7 Active Problems Location of Pain Severity and Description of Pain Patient Has Paino No Site Locations With Dressing Change: No Pain Management and Medication Current Pain Management: Electronic Signature(s) Signed: 10/08/2016 5:15:52 PM By: Gretta Cool, RN, BSN, Kim RN, BSN Entered By: Gretta Cool, RN, BSN, Kim on 10/08/2016 14:44:55 Mindel, [name] ([id number]) -------------------------------------------------------------------------------- Wound Assessment Details Patient Name: [name] Gathers T. Date of Service: 10/08/2016 2:45 PM Medical Record Number: [id number] Patient Account Number: [id number] Date of Birth/Sex: 03-17-1946 (71 y.o. Male) Treating RN: Cornell Barman Primary Care Prabhleen Montemayor: [name] Dupre Other Clinician: Referring Jashanti Clinkscale: [name] Dupre Treating Ziah Turvey/Extender: Frann Rider in Treatment: 7 Wound Status Wound Number: 1 Primary Diabetic Wound/Ulcer of the Lower Etiology: Extremity Wound Location: Right Knee - Anterior Wound Open Wounding Event: Pressure Injury Status: Date Acquired: 07/23/2016 Comorbid Anemia, Asthma, Arrhythmia, Weeks Of Treatment: 7 History: Congestive Heart Failure, Coronary Clustered Wound: No Artery Disease, Hypertension, Type II Diabetes, Osteoarthritis Photos Photo Uploaded By: Gretta Cool, RN, BSN, Kim on 10/08/2016 17:19:45 Wound Measurements Length: (cm) 2 % Reduction in Width: (cm) 3.5 % Reduction in Depth: (cm) 0.4 Epithelializati Area: (cm) 5.498 Undermining: Volume: (cm) 2.199 Starting Po Ending Posit Maximum Dist Area: 30% Volume: -180.1% on: None Yes sition (o'clock): 4 ion (o'clock): 10 ance: (cm) 2.7 Wound Description Classification: Grade 1 Foul Odor After Wound Margin: Flat and  Intact Slough/Fibrino Exudate Amount: Large Exudate Type: Purulent Exudate Color: yellow, brown,  green Cleansing: No Yes Wound Bed Granulation Amount: Large (67-100%) Exposed Structure Necrotic Amount: Small (1-33%) Fascia Exposed: No [name], [name] ([id number]) Necrotic Quality: Adherent Slough Fat Layer (Subcutaneous Tissue) Exposed: Yes Tendon Exposed: No Muscle Exposed: No Joint Exposed: No Bone Exposed: No Periwound Skin Texture Texture Color No Abnormalities Noted: No No Abnormalities Noted: No Callus: No Atrophie Blanche: No Crepitus: No Cyanosis: No Excoriation: No Ecchymosis: No Induration: No Erythema: Yes Rash: No Erythema Location: Circumferential Scarring: No Hemosiderin Staining: No Mottled: No Moisture Pallor: No No Abnormalities Noted: No Rubor: No Dry / Scaly: No Maceration: No Temperature / Pain Temperature: No Abnormality Tenderness on Palpation: Yes Wound Preparation Ulcer Cleansing: Rinsed/Irrigated with Saline Topical Anesthetic Applied: Other: Lidocaine 4%, Treatment Notes Wound #1 (Right, Anterior Knee) 1. Cleansed with: Clean wound with Normal Saline 4. Dressing Applied: Saline moistened guaze 5. Secondary Dressing Applied Gauze and Kerlix/Conform 7. Secured with Tape Notes saline moistened kerlix packed into undermining Electronic Signature(s) Signed: 10/08/2016 5:15:52 PM By: Gretta Cool, RN, BSN, Kim RN, BSN Entered By: Gretta Cool, RN, BSN, Kim on 10/08/2016 14:49:00 [name], [name] ([id number]) -------------------------------------------------------------------------------- [place] Details Patient Name: [name] Gathers T. Date of Service: 10/08/2016 2:45 PM Medical Record Number: [id number] Patient Account Number: [id number] Date of Birth/Sex: Feb 05, 1946 (71 y.o. Male) Treating RN: Cornell Barman Primary Care Doyce Saling: [name] Dupre Other Clinician: Referring Kandace Elrod: [name] Dupre Treating Jazel Nimmons/Extender: Frann Rider in Treatment: 7 Vital Signs Time Taken: 14:22 Temperature (F): 97.5 Height (in):  67 Pulse (bpm): 63 Weight (lbs): 357 Respiratory Rate (breaths/min): 16 Body Mass Index (BMI): 55.9 Blood Pressure (mmHg): 87/41 Capillary Blood Glucose (mg/dl): 229 Reference Range: 80 - 120 mg / dl Pulse Oximetry (%): 92 Notes MD Notified of patient BP. MD came in to check on the patient. 15:02 BP Recheck (patient laying flat) 122/49. Pulse 76. 15:12 80/34 pulse 76. MD Notified. EMS for non-emergent pick up patient. 15:20 94/42, pulse 71 O2 (Room Air) 94. 15:36 EMS called for Emergent transport: BP 79/24 Heart rate 72; O2 94. Blood sugar 229. Patient in and out of consciousness and sweating. Electronic Signature(s) Signed: 10/09/2016 9:53:53 AM By: Lorine Bears RCP, RRT, CHT Previous Signature: 10/08/2016 5:15:52 PM Version By: Gretta Cool RN, BSN, Kim RN, BSN Previous Signature: 10/08/2016 2:58:28 PM Version By: Gretta Cool RN, BSN, Kim RN, BSN Entered By: Lorine Bears on 10/09/2016 09:53:53

## 2016-10-10 NOTE — Progress Notes (Addendum)
TEION, BALLIN ([id number]) Visit Report for 10/08/2016 Chief Complaint Document Details Patient Name: [name], [name]. Date of Service: 10/08/2016 2:45 PM Medical Record Number: [id number] Patient Account Number: [id number] Date of Birth/Sex: 23-Aug-1945 (71 y.o. Male) Treating RN: Cornell Barman Primary Care Provider: [name] Dupre Other Clinician: Referring Provider: [name] Dupre Treating Provider/Extender: Frann Rider in Treatment: 7 Information Obtained from: Patient Chief Complaint Patient is at the clinic for treatment of an open pressure ulcer to the area of the right knee for about 3 weeks now Electronic Signature(s) Signed: 10/08/2016 3:17:01 PM By: Christin Fudge MD, FACS Entered By: Christin Fudge on 10/08/2016 15:17:01 [name] ([id number]) -------------------------------------------------------------------------------- HPI Details Patient Name: [name] Gathers T. Date of Service: 10/08/2016 2:45 PM Medical Record Number: [id number] Patient Account Number: [id number] Date of Birth/Sex: 1946-05-10 (71 y.o. Male) Treating RN: Cornell Barman Primary Care Provider: [name] Dupre Other Clinician: Referring Provider: [name] Dupre Treating Provider/Extender: Frann Rider in Treatment: 7 History of Present Illness Location: right knee swelling with skin ulceration Quality: Patient reports experiencing a dull pain to affected area(s). Severity: Patient states wound are getting worse. Duration: Patient has had the wound for <4 weeks prior to presenting for treatment Timing: Pain in wound is Intermittent (comes and goes Context: The wound occurred when the patient had a cast placed over his right lower extremity and with the cast was removed he had a swelling of the knee with problems with his skin Modifying Factors: Other treatment(s) tried include:orthopedics Associated Signs and Symptoms: swelling of both lower extremities with large amount of  swelling of the knee HPI Description: 71 year old gentleman who has been living in an independent living facility for long-term care has had recurrent falls and recently had a large hematoma on the right knee. Known to be on aspirin and Plavix for history of CABG and was seen in the ER at the end of February after a fall. past medical history of arthritis, congestive heart failure, diabetes mellitus, hypertension, status post CABG o2, status post cholecystectomy, hernia repair and kidney surgery. recent workup for the fall showed negative clinical findings in his extremities and it was thought that he was falling due to deconditioning. a x-ray showed a right fibular neck fracture and was placed in a splint. he was asked to be seen by orthopedic surgeon. the patient cannot tell me which orthopedic doctor had seen him and I'm not able to get any notes from [facility], regarding his orthopedic care. The patient says twice during this last week he has had bloody fluid shooting out of his knee and has drained all over his boot and the flow. 08/21/2016 -- he was seen by the nurse practitioner at the facility and and distend she aspirated about 60 mL of hemorrhagic fluid from his right knee. 08/31/2016 -- since I saw the patient last he was admitted to the hospital at [facility] between 08/23/2016 2 08/27/2016 and was treated with a pulmonary embolism. he had EKOS complete, was started on Xarelto, and also treated for chronic diastolic congestive heart failure. he was asked to follow-up with the wound center and continue management locally. 09/07/2016 -- he has an appointment to see his orthopedic surgeon later this week. 09/14/2016 -- he did see the orthopedic service and they have asked him to continue with physical therapy. They discontinued the boot and the x-ray done showed a healed fracture. They said it was okay to use a wound VAC if needed. Addendum -- we got the note from the  Orthopedic office  where he was seen by [facility] orthopedics on [facility] in [place] and was seen by the PA Carlynn Spry. -- The x-ray which was done was reviewed and there is good healing of the fracture and from the orthopedic point of view he has healed well and they have asked him to continue with good control of his diabetes and management by the wound center including using a wound VAC if required. [name], [name] ([id number]) 09/21/2016 -- the patient's wound VAC had been applied 2 days ago, but the black sponge had not been placed laterally under the skin flap and into the area of the right of the knee, where there was a large cavity. On examination, after removal of his wound VAC, there is a large collection of seropurulent fluid and hematoma in the area starting at the lateral knee and going down to his right lateral compartment of the leg. Clinically, this is a huge infected abscess cavity 10/01/2016 -- patient was admitted between 09/21/2016 and 09/23/2016 with cellulitis of the right lower extremity with a large infected hematoma which was drained at the bedside by surgical services. he was treated with Levaquin and continues to be on Xarelto. the patient is back on a wound VAC which has not been functioning due to the large depth of the hematoma and organized clots. He is still on Keflex orally. Addendum : I spoke to his surgeon Dr. Tama High and we discussed options for further draining this large hematoma which I suspect will get infected sooner than later. I expressed the fact that the wound VAC does not seem to be functioning well and competent enough to drain this large cavity. He will call for him in the office. [id number] 2018 -- the patient has hypotension in the sitting position and is a bit lethargic as compared to his normal self. I spoke personally to his nurse at the nursing homes, Ms Judson Roch and discussed his management including the fact that he had to have a surgical  consultation for the right lower leg, with Dr. Tama High. Prior to the patient's discharge from the wound center he continues to have hypotension and hence I'm going to talk to the ER physician and have him evaluated in the ER. Electronic Signature(s) Signed: 10/08/2016 3:18:58 PM By: Christin Fudge MD, FACS Entered By: Christin Fudge on 10/08/2016 15:18:57 Hoelzer, Zadie [name] ([id number]) -------------------------------------------------------------------------------- Physical Exam Details Patient Name: [name] Gathers T. Date of Service: 10/08/2016 2:45 PM Medical Record Number: [id number] Patient Account Number: [id number] Date of Birth/Sex: 1946-01-25 (71 y.o. Male) Treating RN: Cornell Barman Primary Care Provider: [name] Dupre Other Clinician: Referring Provider: [name] Dupre Treating Provider/Extender: Frann Rider in Treatment: 7 Constitutional . Pulse regular. Respirations normal and unlabored. Afebrile. . Eyes Nonicteric. Reactive to light. Ears, Nose, Mouth, and Throat Lips, teeth, and gums WNL.[name] Moist mucosa without lesions. Neck supple and nontender. No palpable supraclavicular or cervical adenopathy. Normal sized without goiter. Respiratory WNL. No retractions.. Breath sounds WNL, No rubs, rales, rhonchi, or wheeze.. Cardiovascular Heart rhythm and rate regular, no murmur or gallop.. Pedal Pulses WNL. No clubbing, cyanosis or edema. Chest Breasts symmetical and no nipple discharge.. Breast tissue WNL, no masses, lumps, or tenderness.. Lymphatic No adneopathy. No adenopathy. No adenopathy. Musculoskeletal Adexa without tenderness or enlargement.. Digits and nails w/o clubbing, cyanosis, infection, petechiae, ischemia, or inflammatory conditions.. Integumentary (Hair, Skin) No suspicious lesions. No crepitus or fluctuance. No peri-wound warmth or erythema. No masses.[name] Psychiatric Judgement and insight Intact.[name] No  evidence of depression, anxiety, or  agitation.. Notes the wound of the right knee does not communicate with the hematoma in his lateral compartment. This may be getting infected and in keeping with his hypotension I'm going to err on the side of sending him to the ER for further evaluation Electronic Signature(s) Signed: 10/08/2016 3:19:52 PM By: Christin Fudge MD, FACS Entered By: Christin Fudge on 10/08/2016 15:19:51 Nater, Zadie [name] ([id number]) -------------------------------------------------------------------------------- Physician Orders Details Patient Name: [name] Gathers T. Date of Service: 10/08/2016 2:45 PM Medical Record Number: [id number] Patient Account Number: [id number] Date of Birth/Sex: 10-May-1946 (71 y.o. Male) Treating RN: Cornell Barman Primary Care Provider: [name] Dupre Other Clinician: Referring Provider: [name] Dupre Treating Provider/Extender: Frann Rider in Treatment: 7 Verbal / Phone Orders: No Diagnosis Coding Blood Glucose Testing Wound #1 Right,Anterior Knee o Finger stick in clinic as a component of the assessment of: Wound Cleansing Wound #1 Right,Anterior Knee o Clean wound with Normal Saline. o Cleanse wound with mild soap and water Primary Wound Dressing Wound #1 Right,Anterior Knee o Other: - saline moisten kerlix and pack from 9 o'clock to 6 o'clock Secondary Dressing Wound #1 Right,Anterior Knee o ABD pad o Dry Gauze o Conform/Kerlix o Other - tape Dressing Change Frequency Wound #1 Right,Anterior Knee o Other: - change dressing 3 times a DAY Follow-up Appointments Wound #1 Right,Anterior Knee o Return Appointment in 1 week. Edema Control Wound #1 Right,Anterior Knee o Elevate legs to the level of the heart and pump ankles as often as possible Additional Orders / Instructions Wound #1 Right,Anterior Knee o Increase protein intake. NATHON, STEFANSKI ([id number]) o Activity as tolerated Medications-please add to medication  list. Wound #1 Right,Anterior Knee o Other: - Vitamin C, Zinc, MVI Services and Therapies o Admission- Emergency Department - EMS called for emergent transport. Electronic Signature(s) Signed: 10/08/2016 4:13:25 PM By: Christin Fudge MD, FACS Signed: 10/08/2016 5:15:52 PM By: Gretta Cool RN, BSN, Kim RN, BSN Entered By: Gretta Cool, RN, BSN, Kim on 10/08/2016 15:35:18 Grassia, Zadie [name] ([id number]) -------------------------------------------------------------------------------- Problem List Details Patient Name: [name] Gathers T. Date of Service: 10/08/2016 2:45 PM Medical Record Number: [id number] Patient Account Number: [id number] Date of Birth/Sex: 06-17-1945 (71 y.o. 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 Date of Service: 10/08/2016 2:45 PM Medical Record Number: [id number] Patient Account Number: [id number] Date of Birth/Sex: Dec 28, 1945 (71 y.o. Male) Treating RN: Cornell Barman Primary Care Provider:  [name] Dupre Other Clinician: Referring Provider: [name] Dupre Treating Provider/Extender: Frann Rider in Treatment: 7 Subjective Chief Complaint Information obtained from Patient Patient is at the clinic for treatment of an open pressure ulcer to the area of the right knee for about 3 weeks now History of Present Illness (HPI) The following HPI elements were documented for  the patient's wound: Location: right knee swelling with skin ulceration Quality: Patient reports experiencing a dull pain to affected area(s). Severity: Patient states wound are getting worse. Duration: Patient has had the wound for Timing: Pain in wound is Intermittent (comes and goes Context: The wound occurred when the patient had a cast placed over his right lower extremity and with the cast was removed he had a swelling of the knee with problems with his skin Modifying Factors: Other treatment(s) tried include:orthopedics Associated Signs and Symptoms: swelling of both lower extremities with large amount of swelling of the knee 71 year old gentleman who has been living in an independent living facility for long-term care has had recurrent falls and recently had a large hematoma on the right knee. Known to be on aspirin and Plavix for history of CABG and was seen in the ER at the end of February after a fall. past medical history of arthritis, congestive heart failure, diabetes mellitus, hypertension, status post CABG o2, status post cholecystectomy, hernia repair and kidney surgery. recent workup for the fall showed negative clinical findings in his extremities and it was thought that he was falling due to deconditioning. a x-ray showed a right fibular neck fracture and was placed in a splint. he was asked to be seen by orthopedic surgeon. the patient cannot tell me which orthopedic doctor had seen him and I'm not able to get any notes from [facility], regarding his orthopedic care. The patient  says twice during this last week he has had bloody fluid shooting out of his knee and has drained all over his boot and the flow. 08/21/2016 -- he was seen by the nurse practitioner at the facility and and distend she aspirated about 60 mL of hemorrhagic fluid from his right knee. 08/31/2016 -- since I saw the patient last he was admitted to the hospital at [facility] between 08/23/2016 2 08/27/2016 and was treated with a pulmonary embolism. he had EKOS complete, was started on Xarelto, and also treated for chronic diastolic congestive heart failure. he was asked to follow-up with the wound center and continue management locally. 09/07/2016 -- he has an appointment to see his orthopedic surgeon later this week. 09/14/2016 -- he did see the orthopedic service and they have asked him to continue with physical therapy. VITOR, OVERBAUGH ([id number]) They discontinued the boot and the x-ray done showed a healed fracture. They said it was okay to use a wound VAC if needed. Addendum -- we got the note from the Orthopedic office where he was seen by [facility] orthopedics on [facility] in [place] and was seen by the PA Carlynn Spry. -- The x-ray which was done was reviewed and there is good healing of the fracture and from the orthopedic point of view he has healed well and they have asked him to continue with good control of his diabetes and management by the wound center including using a wound VAC if required. 09/21/2016 -- the patient's wound VAC had been applied 2 days ago, but the black sponge had not been placed laterally under the skin flap and into the area of the right of the knee, where there was a large cavity. On examination, after removal of his wound VAC, there is a large collection of seropurulent fluid and hematoma in the area starting at the lateral knee and going down to his right lateral compartment of the leg. Clinically, this is a huge infected abscess cavity 10/01/2016  -- patient was admitted between 09/21/2016 and  09/23/2016 with cellulitis of the right lower extremity with a large infected hematoma which was drained at the bedside by surgical services. he was treated with Levaquin and continues to be on Xarelto. the patient is back on a wound VAC which has not been functioning due to the large depth of the hematoma and organized clots. He is still on Keflex orally. Addendum : I spoke to his surgeon Dr. Tama High and we discussed options for further draining this large hematoma which I suspect will get infected sooner than later. I expressed the fact that the wound VAC does not seem to be functioning well and competent enough to drain this large cavity. He will call for him in the office. [id number] 2018 -- the patient has hypotension in the sitting position and is a bit lethargic as compared to his normal self. I spoke personally to his nurse at the nursing homes, Ms Judson Roch and discussed his management including the fact that he had to have a surgical consultation for the right lower leg, with Dr. Tama High. Prior to the patient's discharge from the wound center he continues to have hypotension and hence I'm going to talk to the ER physician and have him evaluated in the ER. Objective Constitutional Pulse regular. Respirations normal and unlabored. Afebrile. Vitals Time Taken: 2:22 PM, Height: 67 in, Weight: 357 lbs, BMI: 55.9, Temperature: 97.5 F, Pulse: 63 bpm, Respiratory Rate: 16 breaths/min, Blood Pressure: 87/41 mmHg, Capillary Blood Glucose: 229 mg/dl, Pulse Oximetry: 92 %. General Notes: MD Notified of patient BP. MD came in to check on the patient. 15:02 BP Recheck (patient laying flat) 122/49. Pulse 76. 15:12 80/34 pulse 76. MD Notified. EMS for non-emergent pick up patient. 15:20 94/42, pulse 71 O2 (Room Air) 94. 15:36 EMS called for Emergent transport: BP 79/24 Heart rate 72; O2 94. Blood sugar 229. Patient in and out of consciousness and  sweating. Eyes Main, Francisco T. ([id number]) Nonicteric. Reactive to light. Ears, Nose, Mouth, and Throat Lips, teeth, and gums WNL.[name] Moist mucosa without lesions. Neck supple and nontender. No palpable supraclavicular or cervical adenopathy. Normal sized without goiter. Respiratory WNL. No retractions.. Breath sounds WNL, No rubs, rales, rhonchi, or wheeze.. Cardiovascular Heart rhythm and rate regular, no murmur or gallop.. Pedal Pulses WNL. No clubbing, cyanosis or edema. Chest Breasts symmetical and no nipple discharge.. Breast tissue WNL, no masses, lumps, or tenderness.. Lymphatic No adneopathy. No adenopathy. No adenopathy. Musculoskeletal Adexa without tenderness or enlargement.. Digits and nails w/o clubbing, cyanosis, infection, petechiae, ischemia, or inflammatory conditions.[name] Psychiatric Judgement and insight Intact.. No evidence of depression, anxiety, or agitation.. General Notes: the wound of the right knee does not communicate with the hematoma in his lateral compartment. This may be getting infected and in keeping with his hypotension I'm going to err on the side of sending him to the ER for further evaluation Integumentary (Hair, Skin) No suspicious lesions. No crepitus or fluctuance. No peri-wound warmth or erythema. No masses.. Wound #1 status is Open. Original cause of wound was Pressure Injury. The wound is located on the Right,Anterior Knee. The wound measures 2cm length x 3.5cm width x 0.4cm depth; 5.498cm^2 area and 2.199cm^3 volume. There is Fat Layer (Subcutaneous Tissue) Exposed exposed. There is undermining starting at 4:00 and ending at 10:00 with a maximum distance of 2.7cm. There is a large amount of purulent drainage noted. The wound margin is flat and intact. There is large (67-100%) granulation within the wound bed. There is a small (1-33%) amount of  necrotic tissue within the wound bed including Adherent Slough. The periwound skin appearance  exhibited: Erythema. The periwound skin appearance did not exhibit: Callus, Crepitus, Excoriation, Induration, Rash, Scarring, Dry/Scaly, Maceration, Atrophie Blanche, Cyanosis, Ecchymosis, Hemosiderin Staining, Mottled, Pallor, Rubor. The surrounding wound skin color is noted with erythema which is circumferential. Periwound temperature was noted as No Abnormality. The periwound has tenderness on palpation. 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 Tama High last week, regarding possible drainage of this right lateral compartment of the leg hematoma which is apt to be infected soon. Today the patient is more lethargic, has hypotension and in view of this we are going to recommend he be sent to the ER for further evaluation. Local wound care has been done and we will await transport to take him to the ER and I will speak to the ER team personally. Plan Blood Glucose Testing: Wound #1 Right,Anterior Knee: Finger stick in clinic as a component of the assessment of: Wound Cleansing: Wound #1 Right,Anterior Knee: Clean wound with Normal Saline. Cleanse wound with mild soap and water Primary Wound Dressing: Wound #1 Right,Anterior Knee: Other: - saline moisten kerlix and pack from 9 o'clock to 6 o'clock Secondary Dressing: Wound #1 Right,Anterior Knee: ABD pad Dry Gauze Conform/Kerlix Other - tape Dressing Change Frequency: Wound #1 Right,Anterior Knee: Other: - change dressing 3 times a DAY Follow-up Appointments: Wound #1 Right,Anterior Knee: Return  Appointment in 1 week. Edema Control: Wound #1 Right,Anterior Knee: Yore, Cora T. ([id number]) Elevate legs to the level of the heart and pump ankles as often as possible Additional Orders / Instructions: Wound #1 Right,Anterior Knee: Increase protein intake. Activity as tolerated Medications-please add to medication list.: Wound #1 Right,Anterior Knee: Other: - Vitamin C, Zinc, MVI Services and Therapies ordered were: Admission- Emergency Department - EMS called for emergent transport. I had spoken to his surgeon Dr. Tama High last week, regarding possible drainage of this right lateral compartment of the leg hematoma which is apt to be infected soon. Today the patient is more lethargic, has hypotension and in view of this we are going to recommend he be sent to the ER for further evaluation. Local wound care has been done and we will await transport to take him to the ER and I will speak to the ER team personally. Notes Addendum: I have spoken to the ER physician Dr. Jimmye Norman and discussed the patient's care in detail and Dr. Jimmye Norman has accepted his transfer over to the ER and will do the need for left contacting the surgeon Electronic Signature(s) Signed: 10/09/2016 4:06:25 PM By: Christin Fudge MD, FACS Previous Signature: 10/08/2016 4:15:52 PM Version By: Christin Fudge MD, FACS Previous Signature: 10/08/2016 3:29:24 PM Version By: Christin Fudge MD, FACS Previous Signature: 10/08/2016 3:22:57 PM Version By: Christin Fudge MD, FACS Entered By: Christin Fudge on 10/09/2016 16:06:25 Bruso, Zadie [name] ([id number]) -------------------------------------------------------------------------------- SuperBill Details Patient Name: [name] Gathers T. Date of Service: 10/08/2016 Medical Record Number: [id number] Patient Account Number: [id number] Date of Birth/Sex: Apr 29, 1946 (71 y.o. 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 L89.892 Pressure ulcer of other site, stage 2 M70.861 Other soft tissue disorders related to use, overuse and pressure, right lower leg M71.061 Abscess of bursa, right knee Facility Procedures CPT4 Code: [id number] Description: 99213 - WOUND CARE VISIT-LEV 3 EST PT Modifier: Quantity: 1 Physician Procedures CPT4: Description Modifier Quantity Code [id number] 38937 - WC PHYS LEVEL 4 - EST PT 1 ICD-10 Description Diagnosis E11.622 Type 2 diabetes mellitus with other skin ulcer L97.812 Non-pressure chronic ulcer of other part of right lower leg with fat layer  exposed M71.061 Abscess of bursa, right knee M70.861 Other soft tissue disorders related to use, overuse and pressure, right lower leg Electronic Signature(s) Signed: 10/08/2016 3:23:17 PM By: Christin Fudge MD, FACS Entered By: Christin Fudge on 10/08/2016 15:23:17

## 2016-10-11 LAB — GLUCOSE, CAPILLARY
GLUCOSE-CAPILLARY: 129 mg/dL — AB (ref 65–99)
GLUCOSE-CAPILLARY: 166 mg/dL — AB (ref 65–99)
GLUCOSE-CAPILLARY: 199 mg/dL — AB (ref 65–99)
Glucose-Capillary: 132 mg/dL — ABNORMAL HIGH (ref 65–99)
Glucose-Capillary: 167 mg/dL — ABNORMAL HIGH (ref 65–99)

## 2016-10-11 LAB — URINE CULTURE: Culture: 30000 — AB

## 2016-10-11 LAB — VANCOMYCIN, TROUGH: Vancomycin Tr: 14 ug/mL — ABNORMAL LOW (ref 15–20)

## 2016-10-11 MED ORDER — ACETAMINOPHEN 325 MG PO TABS
650.0000 mg | ORAL_TABLET | Freq: Four times a day (QID) | ORAL | Status: DC | PRN
  Administered 2016-10-11: 650 mg via ORAL
  Filled 2016-10-11: qty 2

## 2016-10-11 MED ORDER — DIPHENHYDRAMINE HCL 25 MG PO CAPS
25.0000 mg | ORAL_CAPSULE | Freq: Every evening | ORAL | Status: DC | PRN
  Administered 2016-10-11: 25 mg via ORAL
  Filled 2016-10-11: qty 1

## 2016-10-11 MED ORDER — VANCOMYCIN HCL IN DEXTROSE 1-5 GM/200ML-% IV SOLN
1000.0000 mg | Freq: Two times a day (BID) | INTRAVENOUS | Status: DC
  Administered 2016-10-11 – 2016-10-12 (×3): 1000 mg via INTRAVENOUS
  Filled 2016-10-11 (×5): qty 200

## 2016-10-11 NOTE — Progress Notes (Signed)
[facility] [place] NAME: [name]    MR#:  [id number]  DATE OF BIRTH:  [birth year]  SUBJECTIVE:  Not much complaint, s/p surgery, some pain in leg.  REVIEW OF SYSTEMS:  CONSTITUTIONAL: positive for fever, fatigue or weakness.  EYES: No blurred or double vision.  EARS, NOSE, AND THROAT: No tinnitus or ear pain.  RESPIRATORY: No cough, shortness of breath, wheezing or hemoptysis.  CARDIOVASCULAR: No chest pain, orthopnea, edema.  GASTROINTESTINAL: No nausea, vomiting, diarrhea or abdominal pain.  GENITOURINARY: No dysuria, hematuria.  ENDOCRINE: No polyuria, nocturia,  HEMATOLOGY: No anemia, easy bruising or bleeding SKIN: No rash or lesion. MUSCULOSKELETAL: right knee joint pain or arthritis.   NEUROLOGIC: No tingling, numbness, weakness.  PSYCHIATRY: No anxiety or depression.   Tolerating Diet: yes DRUG ALLERGIES:   Allergies  Allergen Reactions  . Dilaudid [Hydromorphone Hcl] Other (See Comments)    "CANNOT HEAR, CANNOT THINK"    . Hydrocodone-Acetaminophen Nausea And Vomiting  . Morphine And Related Other (See Comments)    Patient was told by a doctor that this "would kill" him  . Tetracycline Other (See Comments)    Patient was told by a doctor that this "would kill" him  . Tetracyclines & Related Other (See Comments)    Patient doesn't recall the reaction  . Penicillins Rash    Has patient had a PCN reaction causing immediate rash, facial/tongue/throat swelling, SOB or lightheadedness with hypotension: Yes Has patient had a PCN reaction causing severe rash involving mucus membranes or skin necrosis: No Has patient had a PCN reaction that required hospitalization No Has patient had a PCN reaction occurring within the last 10 years: No If all of the above answers are "NO", then may proceed with Cephalosporin use.   . Sulfa Antibiotics Rash    VITALS:  Blood pressure (!) 149/52, pulse (!) 105, temperature 98.4 F  (36.9 C), temperature source Oral, resp. rate 18, height 5\' 7"  (1.702 m), weight (!) 155.2 kg (342 lb 1.6 oz), SpO2 93 %.  PHYSICAL EXAMINATION:  GENERAL:  71 y.o.-year-old patient lying in the bed with no acute distress.  EYES: Pupils equal, round, reactive to light and accommodation. No scleral icterus. Extraocular muscles intact.  HEENT: Head atraumatic, normocephalic. Oropharynx and nasopharynx clear.  NECK:  Supple, no jugular venous distention. No thyroid enlargement, no tenderness.  LUNGS: Normal breath sounds bilaterally, no wheezing, rales,rhonchi or crepitation. No use of accessory muscles of respiration.  CARDIOVASCULAR: S1, S2 normal. No murmurs, rubs, or gallops.  ABDOMEN: Soft, nontender, nondistended. Bowel sounds present. No organomegaly or mass.  EXTREMITIES: No pedal edema, cyanosis, or clubbing. Right knee swelling and painful. Dressing present. NEUROLOGIC: Cranial nerves II through XII are intact. Muscle strength 4/5 in all extremities. Sensation intact. Gait not checked.  PSYCHIATRIC: The patient is alert and oriented x 3.  SKIN: No obvious rash, lesion, or ulcer.  LABORATORY PANEL:   CBC  Recent Labs Lab 10/10/16 0501  WBC 7.1  HGB 10.3*  HCT 32.9*  PLT 353   ------------------------------------------------------------------------------------------------------------------  Chemistries   Recent Labs Lab 10/08/16 1629  10/10/16 0501  NA 126*  < > 135  K 2.6*  < > 3.3*  CL 79*  < > 94*  CO2 34*  < > 33*  GLUCOSE 196*  < > 137*  BUN 51*  < > 23*  CREATININE 2.32*  < > 1.00  CALCIUM 9.5  < > 9.1  AST 29  --   --  ALT 21  --   --   ALKPHOS 72  --   --   BILITOT 0.7  --   --   < > = values in this interval not displayed. ------------------------------------------------------------------------------------------------------------------  Cardiac Enzymes No results for input(s): TROPONINI in the last 168  hours. ------------------------------------------------------------------------------------------------------------------  RADIOLOGY:  No results found.   ASSESSMENT AND PLAN:  71 year old morbidly obese man with OSA who was sent by wound care physician for right knee Infection/abscess  * Sepsis: Present on admission   Right knee abscess - Continue IV vancomycin and ceftriaxone for culture-positive MRSA and Proteus as per ID physician - daily packing of entire wound cavity to prevent loculation and re-accumulation of hematoma - Appreciate surgical input, no surgical intervention indicated at this time. Cont dressing and packing. - Wound care nurse consult - ID suggested ABx for 3 weeks.  * Ac renal failure   Hold lasix, hold BP meds  improving with  IV fluids. - Creatinine down to 1.0 from 2.3  * Hypokalemia Repleted and recheck  * PE-diagnosed in April 2018   On xarelto, cont for now.  * Hypothyroidism   Cont levothyroxine  * COPD   No exacerbation.  * OSA: CPAP  * Diabetes: Continue ADA diet with sliding scale insulin  * Chronic anemia on iron  * Hyperlipidemia: Continue statin  * Depression: Continue Wellbutrin  * Essential hypertension: Continue Norvasc, isosorbide and metoprolol  * Chronic Diastolic heart failure: No signs of exacerbation  Pt does not want to go back to his NH, need to go to other place, SW to help tomorrow with that.  CODE STATUS: FULL  TOTAL TIME TAKING CARE OF THIS PATIENT: 25 minutes.   POSSIBLE D/C 1-2 days, DEPENDING ON CLINICAL CONDITION.   Vaughan Basta M.D on 10/11/2016 at 1:09 PM  Between 7am to 6pm - Pager - [phone] After 6pm go to [email or web address] - password [email or web address] [facility]  Office  [phone]  CC: Primary care physician; Jodi Marble, MD  Note: This dictation was prepared with Dragon dictation along with smaller phrase technology. Any transcriptional errors that result  from this process are unintentional.

## 2016-10-12 LAB — GLUCOSE, CAPILLARY
GLUCOSE-CAPILLARY: 137 mg/dL — AB (ref 65–99)
GLUCOSE-CAPILLARY: 153 mg/dL — AB (ref 65–99)

## 2016-10-12 MED ORDER — CLINDAMYCIN HCL 300 MG PO CAPS: 300.0000 mg | ORAL_CAPSULE | Freq: Three times a day (TID) | ORAL | 0 refills | Status: AC

## 2016-10-12 MED ORDER — CEPHALEXIN 500 MG PO CAPS: 500.0000 mg | ORAL_CAPSULE | Freq: Four times a day (QID) | ORAL | Status: DC

## 2016-10-12 NOTE — Care Management Important Message (Signed)
Important Message  Patient Details  Name: [name] MRN: [id number] Date of Birth: 11-17-1945   Medicare Important Message Given:  Yes    Beverly Sessions, RN 10/12/2016, 3:26 PM

## 2016-10-12 NOTE — Clinical Social Work Note (Signed)
CSW spoke with patient and he has gotten his daughter and informed her that he is going back to [facility] today. Shela Leff MSW,LCSW [phone]

## 2016-10-12 NOTE — Discharge Summary (Signed)
[facility] [place] NAME: [name]    MR#:  [id number]  DATE OF BIRTH:  [birth year]  DATE OF ADMISSION:  10/08/2016 ADMITTING PHYSICIAN: Vaughan Basta, MD  DATE OF DISCHARGE: 10/12/2016  PRIMARY CARE PHYSICIAN: Jodi Marble, MD    ADMISSION DIAGNOSIS:  Hypokalemia [E87.6] Abscess [L02.91] Hyponatremia [E87.1] Weakness [R53.1] Renal insufficiency [N28.9] Sepsis, due to unspecified organism ([place]) [A41.9]  DISCHARGE DIAGNOSIS:  Principal Problem:   Abscess of right leg excluding foot Active Problems:   Abscess   SECONDARY DIAGNOSIS:   Past Medical History:  Diagnosis Date  . Arthritis   . Asthma   . CHF (congestive heart failure) ([place])   . Diabetes ([place])   . Heart trouble   . History of stomach ulcers   . Hypertension   . 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 OSA (obstructive sleep apnea) 08/23/2016  . Pulmonary embolism ([place]) 08/23/2016  . S/P CABG x 2   . Skin cancer   . Stroke [facility])     HOSPITAL COURSE:   71 year old morbidly obese man with OSA, History of PE, COPD, chronic respiratory failure, morbid obesity, diabetes, history of previous CVA who presented to the hospital sent by wound care physician for right knee Infection/abscess  * Sepsis: Present on admission due to Right knee abscess -Patient was treated with IV antibiotics with vancomycin, ceftriaxone. Wound cultures was positive for Proteus and MRSA in the past. -Patient was seen by surgical services and did not require any urgent surgical intervention with local wound care. After IV antibiotics and local wound care patient's symptoms have improved. -Patient was also seen by infectious disease who agreed with this management and recommended a 3 week course of oral antibiotics with follow-up with them as an outpatient. Patient presently being discharged on oral clindamycin and Keflex and follow-up with infectious disease as an outpatient.  * Ac renal  failure-secondary to sepsis and dehydration. -Patient's hemodynamics have improved, his diuretics were held, he was given IV fluids and his creatinine is improved and now back to baseline.  * Hypokalemia-this has been repleted now resolved.  * PE-diagnosed in April 2018 -Patient will continue his Xarelto.  * Hypothyroidism-patient will continue Synthroid.  * COPD-no acute exacerbation while in the hospital, patient will continue duo nebs as needed.  * OSA: pt. Will CPAP  * Depression: pt. Will Continue Wellbutrin  * Essential hypertension: pt. Will Continue Norvasc, metoprolol  * Chronic Diastolic heart failure: No signs of exacerbation - pt. Will resume his Lasix, Metolazone as renal function has improved and pt. Will cont. Metoprolol.   DISCHARGE CONDITIONS:   Stable.   CONSULTS OBTAINED:  Treatment Team:  Leonel Ramsay, MD  DRUG ALLERGIES:   Allergies  Allergen Reactions  . Dilaudid [Hydromorphone Hcl] Other (See Comments)    "CANNOT HEAR, CANNOT THINK"    . Hydrocodone-Acetaminophen Nausea And Vomiting  . Morphine And Related Other (See Comments)    Patient was told by a doctor that this "would kill" him  . Tetracycline Other (See Comments)    Patient was told by a doctor that this "would kill" him  . Tetracyclines & Related Other (See Comments)    Patient doesn't recall the reaction  . Penicillins Rash    Has patient had a PCN reaction causing immediate rash, facial/tongue/throat swelling, SOB or lightheadedness with hypotension: Yes Has patient had a PCN reaction causing severe rash involving mucus membranes or skin necrosis: No Has patient had a PCN  reaction that required hospitalization No Has patient had a PCN reaction occurring within the last 10 years: No If all of the above answers are "NO", then may proceed with Cephalosporin use.   . Sulfa Antibiotics Rash    DISCHARGE MEDICATIONS:   Allergies as of 10/12/2016      Reactions    Dilaudid [hydromorphone Hcl] Other (See Comments)   "CANNOT HEAR, CANNOT THINK"   Hydrocodone-acetaminophen Nausea And Vomiting   Morphine And Related Other (See Comments)   Patient was told by a doctor that this "would kill" him   Tetracycline Other (See Comments)   Patient was told by a doctor that this "would kill" him   Tetracyclines & Related Other (See Comments)   Patient doesn't recall the reaction   Penicillins Rash   Has patient had a PCN reaction causing immediate rash, facial/tongue/throat swelling, SOB or lightheadedness with hypotension: Yes Has patient had a PCN reaction causing severe rash involving mucus membranes or skin necrosis: No Has patient had a PCN reaction that required hospitalization No Has patient had a PCN reaction occurring within the last 10 years: No If all of the above answers are "NO", then may proceed with Cephalosporin use.   Sulfa Antibiotics Rash      Medication List    TAKE these medications   amLODipine 10 MG tablet Commonly known as:  NORVASC Take 10 mg by mouth daily.   aspirin 81 MG chewable tablet Chew 81 mg by mouth daily.   BELSOMRA 10 MG Tabs Generic drug:  Suvorexant Take 10 mg by mouth at bedtime.   buPROPion 100 MG 12 hr tablet Commonly known as:  WELLBUTRIN SR Take 100 mg by mouth daily.   cephALEXin 500 MG capsule Commonly known as:  KEFLEX Take 1 capsule (500 mg total) by mouth 4 (four) times daily. Start taking on:  10/29/2016   clindamycin 300 MG capsule Commonly known as:  CLEOCIN Take 1 capsule (300 mg total) by mouth 3 (three) times daily.   doxepin 10 MG capsule Commonly known as:  SINEQUAN Take 10 mg by mouth at bedtime.   fluticasone 50 MCG/ACT nasal spray Commonly known as:  FLONASE Place 1 spray into both nostrils daily.   Fluticasone-Salmeterol 100-50 MCG/DOSE Aepb Commonly known as:  ADVAIR Inhale 1 puff into the lungs 2 (two) times daily.   furosemide 40 MG tablet Commonly known as:  LASIX Take  40 mg by mouth 2 (two) times daily.   ipratropium-albuterol 0.5-2.5 (3) MG/3ML Soln Commonly known as:  DUONEB Inhale 3 mLs into the lungs every 4 (four) hours as needed (shortness of breath/wheezing).   iron polysaccharides 150 MG capsule Commonly known as:  NIFEREX Take 150 mg by mouth daily.   levothyroxine 100 MCG tablet Commonly known as:  SYNTHROID, LEVOTHROID Take 100 mcg by mouth daily before breakfast.   meclizine 25 MG tablet Commonly known as:  ANTIVERT Take 25 mg by mouth 2 (two) times daily as needed for dizziness.   metolazone 5 MG tablet Commonly known as:  ZAROXOLYN Take 5 mg by mouth daily.   metoprolol tartrate 50 MG tablet Commonly known as:  LOPRESSOR Take 50 mg by mouth 2 (two) times daily.   montelukast 10 MG tablet Commonly known as:  SINGULAIR Take 1 tablet by mouth at bedtime.   pantoprazole 40 MG tablet Commonly known as:  PROTONIX Take 40 mg by mouth daily.   PROAIR HFA 108 (90 Base) MCG/ACT inhaler Generic drug:  albuterol Inhale 2 puffs  into the lungs every 4 (four) hours as needed for wheezing or shortness of breath.   rivaroxaban 20 MG Tabs tablet Commonly known as:  XARELTO Take 1 tablet (20 mg total) by mouth daily with supper.   senna-docusate 8.6-50 MG tablet Commonly known as:  Senokot-S Take 2 tablets by mouth daily.   ziprasidone 80 MG capsule Commonly known as:  GEODON Take 80 mg by mouth 2 (two) times daily with a meal.         DISCHARGE INSTRUCTIONS:   DIET:  Cardiac diet  DISCHARGE CONDITION:  Stable  ACTIVITY:  Activity as tolerated  OXYGEN:  Home Oxygen: No.   Oxygen Delivery: room air  DISCHARGE LOCATION:  nursing home   If you experience worsening of your admission symptoms, develop shortness of breath, life threatening emergency, suicidal or homicidal thoughts you must seek medical attention immediately by calling 911 or calling your MD immediately  if symptoms less severe.  You Must read  complete instructions/literature along with all the possible adverse reactions/side effects for all the Medicines you take and that have been prescribed to you. Take any new Medicines after you have completely understood and accpet all the possible adverse reactions/side effects.   Please note  You were cared for by a hospitalist during your hospital stay. If you have any questions about your discharge medications or the care you received while you were in the hospital after you are discharged, you can call the unit and asked to speak with the hospitalist on call if the hospitalist that took care of you is not available. Once you are discharged, your primary care physician will handle any further medical issues. Please note that NO REFILLS for any discharge medications will be authorized once you are discharged, as it is imperative that you return to your primary care physician (or establish a relationship with a primary care physician if you do not have one) for your aftercare needs so that they can reassess your need for medications and monitor your lab values.     Today   Right knee pain has improved.  No fever, hemodynamically stable.  No other acute complaints or events overnight. Will d/c back to SNF today.   VITAL SIGNS:  Blood pressure 113/61, pulse 98, temperature 98 F (36.7 C), resp. rate 16, height 5\' 7"  (1.702 m), weight (!) 155.2 kg (342 lb 1.6 oz), SpO2 94 %.  I/O:   Intake/Output Summary (Last 24 hours) at 10/12/16 1411 Last data filed at 10/12/16 0900  Gross per 24 hour  Intake             1006 ml  Output                0 ml  Net             1006 ml    PHYSICAL EXAMINATION:  GENERAL:  71 y.o.-year-old obese patient lying in the bed in no acute distress.  EYES: Pupils equal, round, reactive to light. No scleral icterus. Extraocular muscles intact.  HEENT: Head atraumatic, normocephalic. Oropharynx and nasopharynx clear.  NECK:  Supple, no jugular venous distention. No  thyroid enlargement, no tenderness.  LUNGS: Normal breath sounds bilaterally, no wheezing, rales,rhonchi. No use of accessory muscles of respiration.  CARDIOVASCULAR: S1, S2 normal. No murmurs, rubs, or gallops.  ABDOMEN: Soft, non-tender, non-distended. Bowel sounds present. No organomegaly or mass.  EXTREMITIES: No pedal edema, cyanosis, or clubbing. Right knee dressing in place with no acute drainage.  NEUROLOGIC: Cranial nerves II through XII are intact. No focal motor or sensory defecits b/l. Globally weak.  PSYCHIATRIC: The patient is alert and oriented x 3.  SKIN: No obvious rash, lesion, or ulcer.   DATA REVIEW:   CBC  Recent Labs Lab 10/10/16 0501  WBC 7.1  HGB 10.3*  HCT 32.9*  PLT 353    Chemistries   Recent Labs Lab 10/08/16 1629  10/10/16 0501  NA 126*  < > 135  K 2.6*  < > 3.3*  CL 79*  < > 94*  CO2 34*  < > 33*  GLUCOSE 196*  < > 137*  BUN 51*  < > 23*  CREATININE 2.32*  < > 1.00  CALCIUM 9.5  < > 9.1  AST 29  --   --   ALT 21  --   --   ALKPHOS 72  --   --   BILITOT 0.7  --   --   < > = values in this interval not displayed.  Cardiac Enzymes No results for input(s): TROPONINI in the last 168 hours.  Microbiology Results  Results for orders placed or performed during the hospital encounter of 10/08/16  Culture, blood (Routine x 2)     Status: None (Preliminary result)   Collection Time: 10/08/16  4:52 PM  Result Value Ref Range Status   Specimen Description BLOOD Blood Culture adequate volume  Final   Special Requests BOTTLES DRAWN AEROBIC AND ANAEROBIC BLOOD LEFT ARM  Final   Culture NO GROWTH 4 DAYS  Final   Report Status PENDING  Incomplete  Culture, blood (Routine x 2)     Status: None (Preliminary result)   Collection Time: 10/08/16  4:52 PM  Result Value Ref Range Status   Specimen Description BLOOD Blood Culture adequate volume  Final   Special Requests   Final    BOTTLES DRAWN AEROBIC AND ANAEROBIC RIGHT ANTECUBITAL   Culture NO  GROWTH 4 DAYS  Final   Report Status PENDING  Incomplete  Urine culture     Status: Abnormal   Collection Time: 10/08/16  4:52 PM  Result Value Ref Range Status   Specimen Description URINE, CLEAN CATCH  Final   Special Requests NONE  Final   Culture 30,000 COLONIES/mL PROVIDENCIA STUARTII (A)  Final   Report Status 10/11/2016 FINAL  Final   Organism ID, Bacteria PROVIDENCIA STUARTII (A)  Final      Susceptibility   Providencia stuartii - MIC*    AMPICILLIN >=32 RESISTANT Resistant     CEFAZOLIN >=64 RESISTANT Resistant     CEFTRIAXONE <=1 SENSITIVE Sensitive     CIPROFLOXACIN >=4 RESISTANT Resistant     GENTAMICIN RESISTANT Resistant     IMIPENEM 4 SENSITIVE Sensitive     NITROFURANTOIN 128 RESISTANT Resistant     TRIMETH/SULFA >=320 RESISTANT Resistant     AMPICILLIN/SULBACTAM >=32 RESISTANT Resistant     PIP/TAZO <=4 SENSITIVE Sensitive     * 30,000 COLONIES/mL PROVIDENCIA STUARTII  MRSA PCR Screening     Status: Abnormal   Collection Time: 10/08/16 11:05 PM  Result Value Ref Range Status   MRSA by PCR POSITIVE (A) NEGATIVE Final    Comment:        The GeneXpert MRSA Assay (FDA approved for NASAL specimens only), is one component of a comprehensive MRSA colonization surveillance program. It is not intended to diagnose MRSA infection nor to guide or monitor treatment for MRSA infections. RESULT CALLED TO, READ BACK BY AND  VERIFIED WITH: Maxwell Marion ON 10/09/16 AT 0051 MNS     RADIOLOGY:  No results found.    Management plans discussed with the patient, family and they are in agreement.  CODE STATUS:     Code Status Orders        Start     Ordered   10/08/16 2212  Full code  Continuous     10/08/16 2211    Code Status History    Date Active Date Inactive Code Status Order ID Comments User Context   09/22/2016  2:04 AM 09/23/2016  2:47 PM Full Code [id number]  Saundra Shelling, MD ED   08/23/2016 10:40 AM 08/27/2016 11:21 PM Full Code [id number]  Brand Males, MD Inpatient   07/14/2016  8:10 AM 07/14/2016  7:07 PM Full Code [id number]  Saundra Shelling, MD ED   04/27/2016  2:28 AM 04/28/2016  5:18 PM Full Code [id number]  Lance Coon, MD Inpatient   02/16/2016  8:34 PM 02/17/2016  3:40 PM Full Code [id number]  Harrison, Ubaldo Glassing, DO ED    Advance Directive Documentation     Most Recent Value  Type of Advance Directive  Living will  Pre-existing out of facility DNR order (yellow form or pink MOST form)  -  "MOST" Form in Place?  -      TOTAL TIME TAKING CARE OF THIS PATIENT: 40 minutes.    Henreitta Leber M.D on 10/12/2016 at 2:11 PM  Between 7am to 6pm - Pager - [phone]  After 6pm go to [email or web address] - Proofreader  [facility]  Office  [phone]  CC: Primary care physician; Jodi Marble, MD

## 2016-10-12 NOTE — Progress Notes (Signed)
10/12/2016  4:06 PM  [name] to be D/C'd Skilled nursing facility per MD order.  Discussed prescriptions and follow up appointments with the patient. Prescriptions given to patient, medication list explained in detail. Pt verbalized understanding.  Allergies as of 10/12/2016      Reactions   Dilaudid [hydromorphone Hcl] Other (See Comments)   "CANNOT HEAR, CANNOT THINK"   Hydrocodone-acetaminophen Nausea And Vomiting   Morphine And Related Other (See Comments)   Patient was told by a doctor that this "would kill" him   Tetracycline Other (See Comments)   Patient was told by a doctor that this "would kill" him   Tetracyclines & Related Other (See Comments)   Patient doesn't recall the reaction   Penicillins Rash   Has patient had a PCN reaction causing immediate rash, facial/tongue/throat swelling, SOB or lightheadedness with hypotension: Yes Has patient had a PCN reaction causing severe rash involving mucus membranes or skin necrosis: No Has patient had a PCN reaction that required hospitalization No Has patient had a PCN reaction occurring within the last 10 years: No If all of the above answers are "NO", then may proceed with Cephalosporin use.   Sulfa Antibiotics Rash      Medication List    TAKE these medications   amLODipine 10 MG tablet Commonly known as:  NORVASC Take 10 mg by mouth daily.   aspirin 81 MG chewable tablet Chew 81 mg by mouth daily.   BELSOMRA 10 MG Tabs Generic drug:  Suvorexant Take 10 mg by mouth at bedtime.   buPROPion 100 MG 12 hr tablet Commonly known as:  WELLBUTRIN SR Take 100 mg by mouth daily.   cephALEXin 500 MG capsule Commonly known as:  KEFLEX Take 1 capsule (500 mg total) by mouth 4 (four) times daily. Start taking on:  10/29/2016   clindamycin 300 MG capsule Commonly known as:  CLEOCIN Take 1 capsule (300 mg total) by mouth 3 (three) times daily.   doxepin 10 MG capsule Commonly known as:  SINEQUAN Take 10 mg by mouth at  bedtime.   fluticasone 50 MCG/ACT nasal spray Commonly known as:  FLONASE Place 1 spray into both nostrils daily.   Fluticasone-Salmeterol 100-50 MCG/DOSE Aepb Commonly known as:  ADVAIR Inhale 1 puff into the lungs 2 (two) times daily.   furosemide 40 MG tablet Commonly known as:  LASIX Take 40 mg by mouth 2 (two) times daily.   ipratropium-albuterol 0.5-2.5 (3) MG/3ML Soln Commonly known as:  DUONEB Inhale 3 mLs into the lungs every 4 (four) hours as needed (shortness of breath/wheezing).   iron polysaccharides 150 MG capsule Commonly known as:  NIFEREX Take 150 mg by mouth daily.   levothyroxine 100 MCG tablet Commonly known as:  SYNTHROID, LEVOTHROID Take 100 mcg by mouth daily before breakfast.   meclizine 25 MG tablet Commonly known as:  ANTIVERT Take 25 mg by mouth 2 (two) times daily as needed for dizziness.   metolazone 5 MG tablet Commonly known as:  ZAROXOLYN Take 5 mg by mouth daily.   metoprolol tartrate 50 MG tablet Commonly known as:  LOPRESSOR Take 50 mg by mouth 2 (two) times daily.   montelukast 10 MG tablet Commonly known as:  SINGULAIR Take 1 tablet by mouth at bedtime.   pantoprazole 40 MG tablet Commonly known as:  PROTONIX Take 40 mg by mouth daily.   PROAIR HFA 108 (90 Base) MCG/ACT inhaler Generic drug:  albuterol Inhale 2 puffs into the lungs every 4 (four)  hours as needed for wheezing or shortness of breath.   rivaroxaban 20 MG Tabs tablet Commonly known as:  XARELTO Take 1 tablet (20 mg total) by mouth daily with supper.   senna-docusate 8.6-50 MG tablet Commonly known as:  Senokot-S Take 2 tablets by mouth daily.   ziprasidone 80 MG capsule Commonly known as:  GEODON Take 80 mg by mouth 2 (two) times daily with a meal.       Vitals:   10/12/16 0426 10/12/16 1220  BP: (!) 120/59 113/61  Pulse: 91 98  Resp: 18 16  Temp: 98.8 F (37.1 C) 98 F (36.7 C)    Skin clean, dry and intact without evidence of skin break  down, no evidence of skin tears noted. IV catheter discontinued intact. Site without signs and symptoms of complications. Dressing and pressure applied. Pt denies pain at this time. No complaints noted.  An After Visit Summary was printed and given to the patient. Patient escorted and D/C via EMS.  [name]

## 2016-10-12 NOTE — Clinical Social Work Note (Addendum)
Patient has had no new bed offers. [facility] will not offer because they stated that there is something wrong with his insurance and [employer] is not primary. CSW spoke with patient and informed him that there were no new bed offers and that [facility] would have to be the option or making arrangements for home. Patient chose to go to [facility]. CSW asked patient if he wanted [place] to call his daughters. Patient replied that he would contact his daughters to inform them if he is discharged today. Shela Leff MSW,LCSW [phone]

## 2016-10-12 NOTE — Progress Notes (Signed)
[facility] PROGRESS NOTE Date of Admission:  10/08/2016     ID: [name] is a 71 y.o. male with knee abscess and wound  Principal Problem:   Abscess of right leg excluding foot Active Problems:   Abscess   Subjective: No fevers, no pain, for dc to SNF Today  ROS  Eleven systems are reviewed and negative except per hpi  Medications:  Antibiotics Given (last 72 hours)    Date/Time Action Medication Dose Rate   10/09/16 1725 New Bag/Given   cefTRIAXone (ROCEPHIN) 2 g in dextrose 5 % 50 mL IVPB 2 g 100 mL/hr   10/09/16 2313 New Bag/Given   vancomycin (VANCOCIN) IVPB 750 mg/150 ml premix 750 mg 150 mL/hr   10/10/16 1106 New Bag/Given   vancomycin (VANCOCIN) IVPB 750 mg/150 ml premix 750 mg 150 mL/hr   10/10/16 1709 New Bag/Given   cefTRIAXone (ROCEPHIN) 2 g in dextrose 5 % 50 mL IVPB 2 g 100 mL/hr   10/11/16 0045 New Bag/Given   vancomycin (VANCOCIN) IVPB 750 mg/150 ml premix 750 mg 150 mL/hr   10/11/16 1238 New Bag/Given   vancomycin (VANCOCIN) IVPB 1000 mg/200 mL premix 1,000 mg 200 mL/hr   10/11/16 1655 New Bag/Given   cefTRIAXone (ROCEPHIN) 2 g in dextrose 5 % 50 mL IVPB 2 g 100 mL/hr   10/11/16 2340 New Bag/Given   vancomycin (VANCOCIN) IVPB 1000 mg/200 mL premix 1,000 mg 200 mL/hr   10/12/16 1152 New Bag/Given   vancomycin (VANCOCIN) IVPB 1000 mg/200 mL premix 1,000 mg 200 mL/hr     . aspirin  81 mg Oral Daily  . buPROPion  100 mg Oral Daily  . Chlorhexidine Gluconate Cloth  6 each Topical Q0600  . doxepin  10 mg Oral QHS  . fluticasone  1 spray Each Nare Daily  . gabapentin  400 mg Oral QHS  . insulin aspart  0-5 Units Subcutaneous QHS  . insulin aspart  0-9 Units Subcutaneous TID WC  . levothyroxine  100 mcg Oral QAC breakfast  . mometasone-formoterol  2 puff Inhalation BID  . montelukast  10 mg Oral QHS  . mupirocin ointment  1 application Nasal BID  . pantoprazole  40 mg Oral Daily  . potassium chloride  20 mEq Oral BID  .  rivaroxaban  20 mg Oral Q breakfast  . senna-docusate  2 tablet Oral Daily  . ziprasidone  80 mg Oral BID WC    Objective: Vital signs in last 24 hours: Temp:  [98 F (36.7 C)-98.8 F (37.1 C)] 98 F (36.7 C) (05/21 1220) Pulse Rate:  [91-107] 98 (05/21 1220) Resp:  [16-24] 16 (05/21 1220) BP: (113-120)/(51-61) 113/61 (05/21 1220) SpO2:  [94 %-96 %] 94 % (05/21 1220) Constitutional: He is oriented to person, place, and time. Morbidly obese, lying in bed  HENT: anicteric Mouth/Throat: Oropharynx is clear and moist. No oropharyngeal exudate.  Cardiovascular: Normal rate, regular rhythm and normal heart sounds. Pulmonary/Chest: Effort normal and breath sounds normal. No respiratory distress. He has no wheezes.  Abdominal: Soft. Bowel sounds are normal. He exhibits no distension. There is no tenderness.  Lymphadenopathy: He has no cervical adenopathy.  Neurological: He is alert and oriented to person, place, and time.  Skin: R ant knee with approx 4 cm open area packed with gauze. There is no purulence, no odor. Only mild ttp Psychiatric: He has a normal mood and affect. His behavior is normal.   Lab Results  Recent Labs  10/10/16 0501  WBC 7.1  HGB 10.3*  HCT 32.9*  NA 135  K 3.3*  CL 94*  CO2 33*  BUN 23*  CREATININE 1.00    Microbiology: Results for orders placed or performed during the hospital encounter of 10/08/16  Culture, blood (Routine x 2)     Status: None (Preliminary result)   Collection Time: 10/08/16  4:52 PM  Result Value Ref Range Status   Specimen Description BLOOD Blood Culture adequate volume  Final   Special Requests BOTTLES DRAWN AEROBIC AND ANAEROBIC BLOOD LEFT ARM  Final   Culture NO GROWTH 4 DAYS  Final   Report Status PENDING  Incomplete  Culture, blood (Routine x 2)     Status: None (Preliminary result)   Collection Time: 10/08/16  4:52 PM  Result Value Ref Range Status   Specimen Description BLOOD Blood Culture adequate volume  Final    Special Requests   Final    BOTTLES DRAWN AEROBIC AND ANAEROBIC RIGHT ANTECUBITAL   Culture NO GROWTH 4 DAYS  Final   Report Status PENDING  Incomplete  Urine culture     Status: Abnormal   Collection Time: 10/08/16  4:52 PM  Result Value Ref Range Status   Specimen Description URINE, CLEAN CATCH  Final   Special Requests NONE  Final   Culture 30,000 COLONIES/mL PROVIDENCIA STUARTII (A)  Final   Report Status 10/11/2016 FINAL  Final   Organism ID, Bacteria PROVIDENCIA STUARTII (A)  Final      Susceptibility   Providencia stuartii - MIC*    AMPICILLIN >=32 RESISTANT Resistant     CEFAZOLIN >=64 RESISTANT Resistant     CEFTRIAXONE <=1 SENSITIVE Sensitive     CIPROFLOXACIN >=4 RESISTANT Resistant     GENTAMICIN RESISTANT Resistant     IMIPENEM 4 SENSITIVE Sensitive     NITROFURANTOIN 128 RESISTANT Resistant     TRIMETH/SULFA >=320 RESISTANT Resistant     AMPICILLIN/SULBACTAM >=32 RESISTANT Resistant     PIP/TAZO <=4 SENSITIVE Sensitive     * 30,000 COLONIES/mL PROVIDENCIA STUARTII  MRSA PCR Screening     Status: Abnormal   Collection Time: 10/08/16 11:05 PM  Result Value Ref Range Status   MRSA by PCR POSITIVE (A) NEGATIVE Final    Comment:        The GeneXpert MRSA Assay (FDA approved for NASAL specimens only), is one component of a comprehensive MRSA colonization surveillance program. It is not intended to diagnose MRSA infection nor to guide or monitor treatment for MRSA infections. RESULT CALLED TO, READ BACK BY AND VERIFIED WITH: [name] ON 10/09/16 AT 0051 MNS      Studies/Results: No results found.  Assessment/Plan: [name] is a 71 y.o. male with R ant knee wound following a fall and development of a large hematoma. Admission 4/30-5/2 with large infected hematoma evacuated with cx with MRSA and Proteus. Dced on levo but was resistant. He is now readmitted with weakness and ARF, dehydration. His wbc was nml no fevers. Wound looks relatively clean and  is being managed by surgery. His CT shows some continued fluid collection but unclear to me if CT done before or after surgeon packed the dressing and broke up loculations. He will need adequate abx coverage to help this heal and will need to follow with surgery and wound care. He has multiple drug allergies including pcn, tetracycline and bactrim.  Recommendations Since clinically improving can dc on oral clindamycin and oral keflex for coverage of both the MRSA and Proteus.  Would rec a 4 week course of abx therapy with follow up with me in 3-4 weeks. Monitor for C diff.   Thank you very much for the consult. Will follow with you.  Ahmeer Tuman P   10/12/2016, 2:57 PM

## 2016-10-12 NOTE — Telephone Encounter (Signed)
I have tried calling the patient again with no response. A letter has been generated and sent to the patient. This can be found under the letter column in EPIC.

## 2016-10-13 LAB — CULTURE, BLOOD (ROUTINE X 2)
CULTURE: NO GROWTH
CULTURE: NO GROWTH
SPECIMEN DESCRIPTION: ADEQUATE
Specimen Description: ADEQUATE

## 2016-10-15 ENCOUNTER — Encounter: Payer: Medicare Other | Admitting: Surgery

## 2016-10-17 NOTE — Progress Notes (Addendum)
MARQUEZE, RAMCHARAN ([id number]) Visit Report for 10/15/2016 Chief Complaint Document Details Patient Name: [name], [name]. Date of Service: 10/15/2016 9:45 AM Medical Record Number: [id number] Patient Account Number: [id number] Date of Birth/Sex: 01-Dec-1945 (71 y.o. Male) Treating RN: Ahmed Prima Primary Care Provider: [name] Dupre Other Clinician: Referring Provider: [name] Dupre Treating Provider/Extender: Frann Rider in Treatment: 8 Information Obtained from: Patient Chief Complaint Patient is at the clinic for treatment of an open pressure ulcer to the area of the right knee for about 3 weeks now Electronic Signature(s) Signed: 10/15/2016 10:11:54 AM By: Christin Fudge MD, FACS Entered By: Christin Fudge on 10/15/2016 10:11:54 Bultema, [name] ([id number]) -------------------------------------------------------------------------------- HPI Details Patient Name: [name] Gathers T. Date of Service: 10/15/2016 9:45 AM Medical Record Number: [id number] Patient Account Number: [id number] Date of Birth/Sex: 09-Feb-1946 (71 y.o. Male) Treating RN: Ahmed Prima Primary Care Provider: [name] Dupre Other Clinician: Referring Provider: [name] Dupre Treating Provider/Extender: Frann Rider in Treatment: 8 History of Present Illness Location: right knee swelling with skin ulceration Quality: Patient reports experiencing a dull pain to affected area(s). Severity: Patient states wound are getting worse. Duration: Patient has had the wound for <4 weeks prior to presenting for treatment Timing: Pain in wound is Intermittent (comes and goes Context: The wound occurred when the patient had a cast placed over his right lower extremity and with the cast was removed he had a swelling of the knee with problems with his skin Modifying Factors: Other treatment(s) tried include:orthopedics Associated Signs and Symptoms: swelling of both lower extremities with large  amount of swelling of the knee HPI Description: 71 year old gentleman who has been living in an independent living facility for long-term care has had recurrent falls and recently had a large hematoma on the right knee. Known to be on aspirin and Plavix for history of CABG and was seen in the ER at the end of February after a fall. past medical history of arthritis, congestive heart failure, diabetes mellitus, hypertension, status post CABG o2, status post cholecystectomy, hernia repair and kidney surgery. recent workup for the fall showed negative clinical findings in his extremities and it was thought that he was falling due to deconditioning. a x-ray showed a right fibular neck fracture and was placed in a splint. he was asked to be seen by orthopedic surgeon. the patient cannot tell me which orthopedic doctor had seen him and I'm not able to get any notes from [place], regarding his orthopedic care. The patient says twice during this last week he has had bloody fluid shooting out of his knee and has drained all over his boot and the flow. 08/21/2016 -- he was seen by the nurse practitioner at the facility and and distend she aspirated about 60 mL of hemorrhagic fluid from his right knee. 08/31/2016 -- since I saw the patient last he was admitted to the hospital at [facility] between 08/23/2016 2 08/27/2016 and was treated with a pulmonary embolism. he had EKOS complete, was started on Xarelto, and also treated for chronic diastolic congestive heart failure. he was asked to follow-up with the wound center and continue management locally. 09/07/2016 -- he has an appointment to see his orthopedic surgeon later this week. 09/14/2016 -- he did see the orthopedic service and they have asked him to continue with physical therapy. They discontinued the boot and the x-ray done showed a healed fracture. They said it was okay to use a wound VAC if needed. Addendum -- we got the note from the  Orthopedic  office where he was seen by [facility] orthopedics on [facility] in [place] and was seen by the PA Carlynn Spry. -- The x-ray which was done was reviewed and there is good healing of the fracture and from the orthopedic point of view he has healed well and they have asked him to continue with good control of his diabetes and management by the wound center including using a wound VAC if required. [name] ([id number]) 09/21/2016 -- the patient's wound VAC had been applied 2 days ago, but the black sponge had not been placed laterally under the skin flap and into the area of the right of the knee, where there was a large cavity. On examination, after removal of his wound VAC, there is a large collection of seropurulent fluid and hematoma in the area starting at the lateral knee and going down to his right lateral compartment of the leg. Clinically, this is a huge infected abscess cavity 10/01/2016 -- patient was admitted between 09/21/2016 and 09/23/2016 with cellulitis of the right lower extremity with a large infected hematoma which was drained at the bedside by surgical services. he was treated with Levaquin and continues to be on Xarelto. the patient is back on a wound VAC which has not been functioning due to the large depth of the hematoma and organized clots. He is still on Keflex orally. Addendum : I spoke to his surgeon Dr. Tama High and we discussed options for further draining this large hematoma which I suspect will get infected sooner than later. I expressed the fact that the wound VAC does not seem to be functioning well and competent enough to drain this large cavity. He will call for him in the office. [id number] 2018 -- the patient has hypotension in the sitting position and is a bit lethargic as compared to his normal self. I spoke personally to his nurse at the nursing homes, Ms Judson Roch and discussed his management including the fact that he had to have a surgical  consultation for the right lower leg, with Dr. Tama High. Prior to the patient's discharge from the wound center he continues to have hypotension and hence I'm going to talk to the ER physician and have him evaluated in the ER. 10/15/2016 -- the patient was admitted to the hospital between 10/08/2016 and 10/12/2016. He was treated for hypokalemia, hyponatremia, renal insufficiency, sepsis and possible abscess of the right lower leg. he was seen by surgical services and the fluid collection was drained with a Yankauer suction from the knee wound and the opinion of the surgeons was that he would not need any surgical intervention and continue with local wound care. I had personally communicated with Dr. Tama High regarding this. the patient is still on Xarelto. During the admission he was also seen by Dr. Ola Spurr for infectious disease consult and he recommended continue vancomycin for MRSA and change meropenem to ceftriaxone for this Proteus. He recommended a four-week course of antibiotic therapy and follow-up back with Dr. Ola Spurr in 4 weeks. before his discharge since there was clinical improvement Dr. Ola Spurr converted the treatment to oral clindamycin and oral Keflex for a four-week course of antibiotic therapy. He was to be monitored for C. difficile. Electronic Signature(s) Signed: 10/15/2016 10:12:02 AM By: Christin Fudge MD, FACS Previous Signature: 10/15/2016 9:57:23 AM Version By: Christin Fudge MD, FACS Entered By: Christin Fudge on 10/15/2016 10:12:01 [name] ([id number]) -------------------------------------------------------------------------------- Physical Exam Details Patient Name: [name] Gathers T. Date of Service:  10/15/2016 9:45 AM Medical Record Number: [id number] Patient Account Number: [id number] Date of Birth/Sex: [birth year] (71 y.o. Male) Treating RN: Ahmed Prima Primary Care Provider: [name] Dupre Other Clinician: Referring Provider:  [name] Dupre Treating Provider/Extender: Frann Rider in Treatment: 8 Constitutional . Pulse regular. Respirations normal and unlabored. Afebrile. . Eyes Nonicteric. Reactive to light. Ears, Nose, Mouth, and Throat Lips, teeth, and gums WNL.[name] Moist mucosa without lesions. Neck supple and nontender. No palpable supraclavicular or cervical adenopathy. Normal sized without goiter. Respiratory WNL. No retractions.. Cardiovascular Pedal Pulses WNL. No clubbing, cyanosis or edema. Lymphatic No adneopathy. No adenopathy. No adenopathy. Musculoskeletal Adexa without tenderness or enlargement.. Digits and nails w/o clubbing, cyanosis, infection, petechiae, ischemia, or inflammatory conditions.. Integumentary (Hair, Skin) No suspicious lesions. No crepitus or fluctuance. No peri-wound warmth or erythema. No masses.[name] Psychiatric Judgement and insight Intact.. No evidence of depression, anxiety, or agitation.. Notes I have examined the patient in great detail and the wound at the knee has sealed off a few centimeters below the open wound. There is a collection of fluid or hematoma in the right lateral compartment of the leg. Drainage of this fluid collection is not possible in the setting of over wound center or a nursing home. Electronic Signature(s) Signed: 10/15/2016 10:13:25 AM By: Christin Fudge MD, FACS Entered By: Christin Fudge on 10/15/2016 10:13:24 [name] ([id number]) -------------------------------------------------------------------------------- Physician Orders Details Patient Name: [name] Gathers T. Date of Service: 10/15/2016 9:45 AM Medical Record Number: [id number] Patient Account Number: [id number] Date of Birth/Sex: 12-27-1945 (72 y.o. Male) Treating RN: Ahmed Prima Primary Care Provider: [name] Dupre Other Clinician: Referring Provider: [name] Dupre Treating Provider/Extender: Frann Rider in Treatment: 8 Verbal / Phone Orders:  Yes Clinician: Carolyne Fiscal, Debi Read Back and Verified: Yes Diagnosis Coding Wound Cleansing Wound #1 Right,Anterior Knee o Clean wound with Normal Saline. o Cleanse wound with mild soap and water Anesthetic Wound #1 Right,Anterior Knee o Topical Lidocaine 4% cream applied to wound bed prior to debridement - Only in the Wound Clinic Primary Wound Dressing Wound #1 Right,Anterior Knee o Iodoform packing Gauze - 1/2 in. Secondary Dressing Wound #1 Right,Anterior Knee o ABD pad o Dry Gauze o Conform/Kerlix o Other - tape Dressing Change Frequency Wound #1 Right,Anterior Knee o Change dressing every day. o Other: - as needed Follow-up Appointments Wound #1 Right,Anterior Knee o Return Appointment in 1 week. Edema Control Wound #1 Right,Anterior Knee o Elevate legs to the level of the heart and pump ankles as often as possible Additional Orders / Instructions Porcelli, Lesly T. ([id number]) Wound #1 Right,Anterior Knee o Increase protein intake. o Activity as tolerated Negative Pressure Wound Therapy Wound #1 Right,Anterior Knee o Discontinue NPWT. - Stop Wound Vac Medications-please add to medication list. Wound #1 Right,Anterior Knee o P.O. Antibiotics - continue oral antibiotics as ordered o Other: - Vitamin C, Zinc, MVI Consults o General Surgery Electronic Signature(s) Signed: 10/15/2016 4:08:27 PM By: Alric Quan Signed: 10/15/2016 4:20:02 PM By: Christin Fudge MD, FACS Entered By: Alric Quan on 10/15/2016 10:42:35 Cattell, [name] ([id number]) -------------------------------------------------------------------------------- Problem List Details Patient Name: Vanwieren, Dominque T. Date of Service: 10/15/2016 9:45 AM Medical Record Number: [id number] Patient Account Number: [id number] Date of Birth/Sex: [birth year] (71 y.o. 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, right  knee 08/14/2016 Yes L97.812 Non-pressure chronic ulcer of other part of right lower leg 08/14/2016 Yes with fat layer exposed L89.892 Pressure ulcer of other site, stage 2 08/14/2016 Yes M70.861 Other soft tissue disorders related to use, overuse and 08/14/2016 Yes pressure, right lower leg M71.061 Abscess of bursa, right knee 09/21/2016 Yes Inactive Problems Resolved Problems Electronic Signature(s) Signed: 10/15/2016 10:11:33 AM By: Christin Fudge MD, FACS Entered By: Christin Fudge on 10/15/2016 10:11:32 Meany, [name] ([id number]) -------------------------------------------------------------------------------- Progress Note Details Patient Name: [name] Gathers T. Date of Service: 10/15/2016 9:45 AM Medical Record Number: [id number] Patient Account Number: [id number] Date of Birth/Sex: 1946-04-16 (71 y.o. Male) Treating RN: Ahmed Prima Primary Care Provider: [name] Dupre Other Clinician: Referring Provider: [name] Dupre Treating Provider/Extender: Frann Rider in Treatment: 8 Subjective Chief Complaint Information obtained from Patient Patient is at the clinic for treatment of an open pressure ulcer to the area of the right knee for about 3 weeks now History of Present Illness (HPI) The following HPI elements were documented for the patient's wound: Location: right knee swelling with skin ulceration Quality: Patient reports experiencing a dull pain to affected area(s). Severity: Patient states wound are getting worse. Duration: Patient has had the wound for Timing: Pain in wound is Intermittent (comes and goes Context: The wound occurred when the patient had a cast placed over his right lower  extremity and with the cast was removed he had a swelling of the knee with problems with his skin Modifying Factors: Other treatment(s) tried include:orthopedics Associated Signs and Symptoms: swelling of both lower extremities with large amount of swelling of the knee 71 year old gentleman who has been living in an independent living facility for long-term care has had recurrent falls and recently had a large hematoma on the right knee. Known to be on aspirin and Plavix for history of CABG and was seen in the ER at the end of February after a fall. past medical history of arthritis, congestive heart failure, diabetes mellitus, hypertension, status post CABG o2, status post cholecystectomy, hernia repair and kidney surgery. recent workup for the fall showed negative clinical findings in his extremities and it was thought that he was falling due to deconditioning. a x-ray showed a right fibular neck fracture and was placed in a splint. he was asked to be seen by orthopedic surgeon. the patient cannot tell me which orthopedic doctor had seen him and I'm not able to get any notes from [place], regarding his orthopedic care. The patient says twice during this last week he has had bloody fluid shooting out of his knee and has drained all over his boot and the flow. 08/21/2016 -- he was seen by the nurse practitioner at the facility and and distend she aspirated about 60 mL of hemorrhagic fluid from his right knee. 08/31/2016 -- since I saw the patient last he was admitted to the hospital at [facility] between 08/23/2016 2 08/27/2016 and was treated with a pulmonary embolism. he had EKOS complete, was started on Xarelto, and also treated for chronic diastolic congestive heart failure. he was asked to follow-up with the wound center and continue management locally. 09/07/2016 -- he has an appointment to see his orthopedic surgeon later this week. 09/14/2016 -- he did see the orthopedic service and  they have asked him to continue with physical therapy. VERL, KITSON ([id number]) They discontinued the boot and the x-ray done showed a healed fracture. They said it was okay to use a wound VAC if needed. Addendum --  we got the note from the Orthopedic office where he was seen by [place] on [facility] in [place] and was seen by the [facility]. -- The x-ray which was done was reviewed and there is good healing of the fracture and from the orthopedic point of view he has healed well and they have asked him to continue with good control of his diabetes and management by the wound center including using a wound VAC if required. 09/21/2016 -- the patient's wound VAC had been applied 2 days ago, but the black sponge had not been placed laterally under the skin flap and into the area of the right of the knee, where there was a large cavity. On examination, after removal of his wound VAC, there is a large collection of seropurulent fluid and hematoma in the area starting at the lateral knee and going down to his right lateral compartment of the leg. Clinically, this is a huge infected abscess cavity 10/01/2016 -- patient was admitted between 09/21/2016 and 09/23/2016 with cellulitis of the right lower extremity with a large infected hematoma which was drained at the bedside by surgical services. he was treated with Levaquin and continues to be on Xarelto. the patient is back on a wound VAC which has not been functioning due to the large depth of the hematoma and organized clots. He is still on Keflex orally. Addendum : I spoke to his surgeon Dr. Tama High and we discussed options for further draining this large hematoma which I suspect will get infected sooner than later. I expressed the fact that the wound VAC does not seem to be functioning well and competent enough to drain this large cavity. He will call for him in the office. [id number] 2018 -- the patient has  hypotension in the sitting position and is a bit lethargic as compared to his normal self. I spoke personally to his nurse at the nursing homes, Ms Judson Roch and discussed his management including the fact that he had to have a surgical consultation for the right lower leg, with Dr. Tama High. Prior to the patient's discharge from the wound center he continues to have hypotension and hence I'm going to talk to the ER physician and have him evaluated in the ER. 10/15/2016 -- the patient was admitted to the hospital between 10/08/2016 and 10/12/2016. He was treated for hypokalemia, hyponatremia, renal insufficiency, sepsis and possible abscess of the right lower leg. he was seen by surgical services and the fluid collection was drained with a Yankauer suction from the knee wound and the opinion of the surgeons was that he would not need any surgical intervention and continue with local wound care. I had personally communicated with Dr. Tama High regarding this. the patient is still on Xarelto. During the admission he was also seen by Dr. Ola Spurr for infectious disease consult and he recommended continue vancomycin for MRSA and change meropenem to ceftriaxone for this Proteus. He recommended a four-week course of antibiotic therapy and follow-up back with Dr. Ola Spurr in 4 weeks. before his discharge since there was clinical improvement Dr. Ola Spurr converted the treatment to oral clindamycin and oral Keflex for a four-week course of antibiotic therapy. He was to be monitored for C. difficile. Objective Ice, Maykel T. ([id number]) Constitutional Pulse regular. Respirations normal and unlabored. Afebrile. Vitals Time Taken: 9:36 AM, Height: 67 in, Weight: 357 lbs, BMI: 55.9, Temperature: 99.2 F, Pulse: 79 bpm, Respiratory Rate: 16 breaths/min, Blood Pressure: 102/52 mmHg. Eyes Nonicteric. Reactive to  light. Ears, Nose, Mouth, and Throat Lips, teeth, and gums WNL.[name] Moist mucosa  without lesions. Neck supple and nontender. No palpable supraclavicular or cervical adenopathy. Normal sized without goiter. Respiratory WNL. No retractions.. Cardiovascular Pedal Pulses WNL. No clubbing, cyanosis or edema. Lymphatic No adneopathy. No adenopathy. No adenopathy. Musculoskeletal Adexa without tenderness or enlargement.. Digits and nails w/o clubbing, cyanosis, infection, petechiae, ischemia, or inflammatory conditions.[name] Psychiatric Judgement and insight Intact.. No evidence of depression, anxiety, or agitation.. General Notes: I have examined the patient in great detail and the wound at the knee has sealed off a few centimeters below the open wound. There is a collection of fluid or hematoma in the right lateral compartment of the leg. Drainage of this fluid collection is not possible in the setting of over wound center or a nursing home. Integumentary (Hair, Skin) No suspicious lesions. No crepitus or fluctuance. No peri-wound warmth or erythema. No masses.. Wound #1 status is Open. Original cause of wound was Pressure Injury. The wound is located on the Right,Anterior Knee. The wound measures 1.3cm length x 2.9cm width x 0.6cm depth; 2.961cm^2 area and 1.777cm^3 volume. There is Fat Layer (Subcutaneous Tissue) Exposed exposed. There is no tunneling noted, however, there is undermining starting at 6:00 and ending at 9:00 with a maximum distance of 3.5cm. There is a large amount of serosanguineous drainage noted. The wound margin is flat and intact. There is large (67-100%) red granulation within the wound bed. There is a small (1-33%) amount of necrotic tissue within the wound bed including Adherent Slough. The periwound skin appearance exhibited: Erythema. The periwound skin appearance did not exhibit: Callus, Crepitus, Excoriation, Induration, Rash, Scarring, Dry/Scaly, Maceration, Atrophie Blanche, Cyanosis, Ecchymosis, Hemosiderin Staining, Mottled, Adolf, Gerhart T.  ([id number]) Pallor, Rubor. The surrounding wound skin color is noted with erythema which is circumferential. Periwound temperature was noted as No Abnormality. The periwound has tenderness on palpation. 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 Cleanse wound with mild soap and water Anesthetic: Wound #1 Right,Anterior Knee: Topical Lidocaine 4% cream applied to wound bed prior to debridement - Only in the Wound Clinic Primary Wound Dressing: Wound #1 Right,Anterior Knee: Iodoform packing Gauze - 1/2 in. Secondary Dressing: Wound #1 Right,Anterior Knee: ABD pad Dry Gauze Conform/Kerlix Other - tape Dressing Change Frequency: Wound #1 Right,Anterior Knee: Change dressing every day. Other: - as needed Follow-up Appointments: Wound #1 Right,Anterior Knee: Return Appointment in 1 week. Edema Control: Wound #1 Right,Anterior Knee: Dombeck, Nameer T. ([id number]) Elevate legs to the level of the heart and pump ankles as often as possible Additional Orders / Instructions: Wound #1 Right,Anterior Knee: Increase protein intake. Activity as tolerated Negative Pressure Wound Therapy: Wound #1 Right,Anterior Knee: Discontinue NPWT. - Stop Wound Vac Medications-please add to medication list.: Wound #1 Right,Anterior Knee: P.O. Antibiotics - continue oral antibiotics as ordered Other: - Vitamin C, Zinc, MVI Consults ordered were: General Surgery I have reviewed the patient's recent hospital admission, noted the surgical opinion of Dr. Tama High and Dr. Phoebe Perch. I have also noted the other consultations including the oral antibiotics  place by Dr. Ola Spurr, and the other supportive care as per the hospitalist. I have recommended: 1. Packing the wound at the knee with half inch iodoform gauze to be changed daily and when necessary. 2. The wound has sealed off at a level of the knee and packing the wound into the lateral compartment, in the wound center or by the nurses at the nursing home, is impossible. 3. I strongly believe that the patient needs to have a dependent drainage of this cavity and will refer him back to Dr. Tama High or his colleagues in the office as soon as possible. A JP drain placed in a minor OR setting, will go a long way to sort this patient's recurrent right lower leg lateral compartment hematoma/seroma. 4. Continue with oral antibiotics as per Dr. Ola Spurr. 5. Review in the wound center once an appropriate surgical consultation has taken place. Electronic Signature(s) Signed: 10/15/2016 4:21:38 PM By: Christin Fudge MD, FACS Previous Signature: 10/15/2016 10:21:28 AM Version By: Christin Fudge MD, FACS Entered By: Christin Fudge on 10/15/2016 16:21:38 Caslin, [name] ([id number]) -------------------------------------------------------------------------------- SuperBill Details Patient Name: [name] Gathers T. Date of Service: 10/15/2016 Medical Record Number: [id number] Patient Account Number: [id number] Date of Birth/Sex: 11/04/1945 (71 y.o. 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 L89.892 Pressure ulcer of  other site, stage 2 M70.861 Other soft tissue disorders related to use, overuse and pressure, right lower leg M71.061 Abscess of bursa, right knee Facility Procedures CPT4 Code: 97530051 Description: 99213 - WOUND CARE VISIT-LEV 3 EST PT Modifier: Quantity: 1 Physician Procedures CPT4: Description Modifier Quantity Code 1021117 35670 - WC PHYS LEVEL 3 - EST PT 1 ICD-10 Description Diagnosis E11.622 Type 2 diabetes mellitus with other skin ulcer L97.812 Non-pressure chronic ulcer of other part of right lower leg with fat layer  exposed 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

## 2016-10-17 NOTE — Progress Notes (Signed)
[name] ([id number]) Visit Report for 10/15/2016 Arrival Information Details Patient Name: [name], [name]. Date of Service: 10/15/2016 9:45 AM Medical Record Number: [id number] Patient Account Number: [id number] Date of Birth/Sex: 03/03/1946 (71 y.o. Male) Treating RN: [name] Primary Care [name]: [name] Other Clinician: Referring [name]: [name] Treating [name]/Extender: [name] in Treatment: 8 Visit Information History Since Last Visit All ordered tests and consults were completed: No Patient Arrived: Wheel Chair Added or deleted any medications: No Arrival Time: 09:28 Any new allergies or adverse reactions: No Accompanied By: self Had a fall or experienced change in No Transfer Assistance: [employer] activities of daily living that may affect Patient Lift risk of falls: Patient Identification Verified: Yes Signs or symptoms of abuse/neglect since last No Secondary Verification Process Yes visito Completed: Hospitalized since last visit: No Patient Requires Transmission- No Has Dressing in Place as Prescribed: Yes Based Precautions: Pain Present Now: Yes Patient Has Alerts: No Electronic Signature(s) Signed: 10/15/2016 4:08:27 PM By: [name] Entered By: [name] on 10/15/2016 09:36:32 [name], [name] ([id number]) -------------------------------------------------------------------------------- Clinic Level of Care Assessment Details Patient Name: [name]. Date of Service: 10/15/2016 9:45 AM Medical Record Number: [id number] Patient Account Number: [id number] Date of Birth/Sex: [birth year] (71 y.o. Male) Treating RN: [name] Primary Care Richell Corker: [name] Other Clinician: Referring [name]: [name] Treating [name]/Extender: [name] in Treatment: 8 Clinic Level of Care Assessment Items TOOL 4 Quantity Score X - Use when only an EandM is performed  on FOLLOW-UP visit 1 0 ASSESSMENTS - Nursing Assessment / Reassessment X - Reassessment of Co-morbidities (includes updates in patient status) 1 10 X - Reassessment of Adherence to Treatment Plan 1 5 ASSESSMENTS - Wound and Skin Assessment / Reassessment X - Simple Wound Assessment / Reassessment - one wound 1 5 []  - Complex Wound Assessment / Reassessment - multiple wounds 0 []  - Dermatologic / Skin Assessment (not related to wound area) 0 ASSESSMENTS - Focused Assessment []  - Circumferential Edema Measurements - multi extremities 0 []  - Nutritional Assessment / Counseling / Intervention 0 []  - Lower Extremity Assessment (monofilament, tuning fork, pulses) 0 []  - Peripheral Arterial Disease Assessment (using hand held doppler) 0 ASSESSMENTS - Ostomy and/or Continence Assessment and Care []  - Incontinence Assessment and Management 0 []  - Ostomy Care Assessment and Management (repouching, etc.) 0 PROCESS - Coordination of Care []  - Simple Patient / Family Education for ongoing care 0 X - Complex (extensive) Patient / Family Education for ongoing care 1 20 X - Staff obtains Programmer, systems, Records, Test Results / Process Orders 1 10 X - Staff telephones HHA, Nursing Homes / Clarify orders / etc 1 10 []  - Routine Transfer to another Facility (non-emergent condition) 0 [name], [name] ([id number]) []  - Routine Hospital Admission (non-emergent condition) 0 []  - New Admissions / Biomedical engineer / Ordering NPWT, Apligraf, etc. 0 []  - Emergency Hospital Admission (emergent condition) 0 X - Simple Discharge Coordination 1 10 []  - Complex (extensive) Discharge Coordination 0 PROCESS - Special Needs []  - Pediatric / Minor Patient Management 0 []  - Isolation Patient Management 0 []  - Hearing / Language / Visual special needs 0 []  - Assessment of Community assistance (transportation, D/C planning, etc.) 0 []  - Additional assistance / Altered mentation 0 []  - Support Surface(s) Assessment  (bed, cushion, seat, etc.) 0 INTERVENTIONS - Wound Cleansing / Measurement []  - Simple Wound Cleansing - one wound 0 X - Complex Wound Cleansing - multiple wounds 1 5  X - Wound Imaging (photographs - any number of wounds) 1 5 []  - Wound Tracing (instead of photographs) 0 []  - Simple Wound Measurement - one wound 0 X - Complex Wound Measurement - multiple wounds 1 5 INTERVENTIONS - Wound Dressings []  - Small Wound Dressing one or multiple wounds 0 X - Medium Wound Dressing one or multiple wounds 1 15 []  - Large Wound Dressing one or multiple wounds 0 X - Application of Medications - topical 1 5 []  - Application of Medications - injection 0 INTERVENTIONS - Miscellaneous []  - External ear exam 0 [name], [name] ([id number]) []  - Specimen Collection (cultures, biopsies, blood, body fluids, etc.) 0 []  - Specimen(s) / Culture(s) sent or taken to Lab for analysis 0 []  - Patient Transfer (multiple staff / Harrel Lemon Lift / Similar devices) 0 []  - Simple Staple / Suture removal (25 or less) 0 []  - Complex Staple / Suture removal (26 or more) 0 []  - Hypo / Hyperglycemic Management (close monitor of Blood Glucose) 0 []  - Ankle / Brachial Index (ABI) - do not check if billed separately 0 X - Vital Signs 1 5 Has the patient been seen at the hospital within the last three years: Yes Total Score: 110 Level Of Care: New/Established - Level 3 Electronic Signature(s) Signed: 10/15/2016 4:08:27 PM By: [name] Entered By: [name] on 10/15/2016 10:55:04 [name], [name] ([id number]) -------------------------------------------------------------------------------- Encounter Discharge Information Details Patient Name: [name] Gathers T. Date of Service: 10/15/2016 9:45 AM Medical Record Number: [id number] Patient Account Number: [id number] Date of Birth/Sex: 1945-10-06 (71 y.o. Male) Treating RN: [name] Primary Care Jerusalen Mateja: [name] Other Clinician: Referring Yurani Fettes:  [name] Treating Dimitriy Carreras/Extender: [name] in Treatment: 8 Encounter Discharge Information Items Discharge Pain Level: 3 Discharge Condition: Stable Ambulatory Status: Wheelchair Discharge Destination: Home Transportation: Private Auto Accompanied By: self Schedule Follow-up Appointment: Yes Medication Reconciliation completed and provided to Patient/Care No Sullivan Jacuinde: Provided on Clinical Summary of Care: 10/15/2016 Form Type Recipient Paper Patient JE Electronic Signature(s) Signed: 10/15/2016 10:20:06 AM By: Ruthine Dose Entered By: Ruthine Dose on 10/15/2016 10:20:06 Minner, [name] ([id number]) -------------------------------------------------------------------------------- Lower Extremity Assessment Details Patient Name: [name] Gathers T. Date of Service: 10/15/2016 9:45 AM Medical Record Number: [id number] Patient Account Number: [id number] Date of Birth/Sex: [birth year] (71 y.o. Male) Treating RN: [name] Primary Care Kamber Vignola: [name] Other Clinician: Referring Ala Kratz: [name] Treating Donatello Kleve/Extender: [name] in Treatment: 8 Vascular Assessment Pulses: Dorsalis Pedis Palpable: [Right:Yes] Posterior Tibial Extremity colors, hair growth, and conditions: Extremity Color: [Right:Normal] Temperature of Extremity: [Right:Warm] Capillary Refill: [Right:< 3 seconds] Electronic Signature(s) Signed: 10/15/2016 4:08:27 PM By: [name] Entered By: [name] on 10/15/2016 09:46:55 [name], [name] ([id number]) -------------------------------------------------------------------------------- Multi Wound Chart Details Patient Name: [name] Gathers T. Date of Service: 10/15/2016 9:45 AM Medical Record Number: [id number] Patient Account Number: [id number] Date of Birth/Sex: 21-Sep-1945 (71 y.o. Male) Treating RN: [name] Primary Care Payson Crumby: [name] Other Clinician: Referring  Thos Matsumoto: [name] Treating Akacia Boltz/Extender: [name] in Treatment: 8 Vital Signs Height(in): 67 Pulse(bpm): 79 Weight(lbs): 357 Blood Pressure 102/52 (mmHg): Body Mass Index(BMI): 56 Temperature(F): 99.2 Respiratory Rate 16 (breaths/min): Photos: [1:No Photos] [N/A:N/A] Wound Location: [1:Right Knee - Anterior] [N/A:N/A] Wounding Event: [1:Pressure Injury] [N/A:N/A] Primary Etiology: [1:Diabetic Wound/Ulcer of the Lower Extremity] [N/A:N/A] Comorbid History: [1:Anemia, Asthma, Arrhythmia, Congestive Heart Failure, Coronary Artery Disease, Hypertension, Type II Diabetes, Osteoarthritis] [N/A:N/A] Date Acquired: [1:07/23/2016] [N/A:N/A] Weeks of Treatment: [1:8] [N/A:N/A] Wound Status: [1:Open] [N/A:N/A] Measurements L x  W x D 1.3x2.9x0.6 [N/A:N/A] (cm) Area (cm) : [1:2.961] [N/A:N/A] Volume (cm) : [1:1.777] [N/A:N/A] % Reduction in Area: [1:62.30%] [N/A:N/A] % Reduction in Volume: -126.40% [N/A:N/A] Starting Position 1 6 (o'clock): Ending Position 1 [1:9] (o'clock): Maximum Distance 1 3.5 (cm): Undermining: [1:Yes] [N/A:N/A] Classification: [1:Grade 1] [N/A:N/A] Exudate Amount: [1:Large] [N/A:N/A] Exudate Type: [1:Serosanguineous] [N/A:N/A] Exudate Color: red, brown N/A N/A Wound Margin: Flat and Intact N/A N/A Granulation Amount: Large (67-100%) N/A N/A Granulation Quality: Red N/A N/A Necrotic Amount: Small (1-33%) N/A N/A Exposed Structures: Fat Layer (Subcutaneous N/A N/A Tissue) Exposed: Yes Fascia: No Tendon: No Muscle: No Joint: No Bone: No Epithelialization: None N/A N/A Periwound Skin Texture: Excoriation: No N/A N/A Induration: No Callus: No Crepitus: No Rash: No Scarring: No Periwound Skin Maceration: No N/A N/A Moisture: Dry/Scaly: No Periwound Skin Color: Erythema: Yes N/A N/A Atrophie Blanche: No Cyanosis: No Ecchymosis: No Hemosiderin Staining: No Mottled: No Pallor: No Rubor: No Erythema Location:  Circumferential N/A N/A Temperature: No Abnormality N/A N/A Tenderness on Yes N/A N/A Palpation: Wound Preparation: Ulcer Cleansing: N/A N/A Rinsed/Irrigated with Saline Topical Anesthetic Applied: Other: Lidocaine 4% Treatment Notes Electronic Signature(s) Signed: 10/15/2016 10:11:39 AM By: Christin Fudge MD, FACS Entered By: Christin Fudge on 10/15/2016 10:11:38 [name] ([id number]) -------------------------------------------------------------------------------- [place] Details Patient Name: [name] Gathers T. Date of Service: 10/15/2016 9:45 AM Medical Record Number: [id number] Patient Account Number: [id number] Date of Birth/Sex: [birth year] (71 y.o. Male) Treating RN: Carolyne Fiscal, Debi Primary Care Amram Maya: [name] Other Clinician: Referring Kyria Bumgardner: [name] Treating Kylle Lall/Extender: [name] in Treatment: 8 Active Inactive ` Orientation to the Wound Care Program Nursing Diagnoses: Knowledge deficit related to the wound healing center program Goals: Patient/caregiver will verbalize understanding of the [place] Program Date Initiated: 08/14/2016 Target Resolution Date: 12/14/2016 Goal Status: Active Interventions: Provide education on orientation to the wound center Notes: ` Pressure Nursing Diagnoses: Knowledge deficit related to causes and risk factors for pressure ulcer development Knowledge deficit related to management of pressures ulcers Potential for impaired tissue integrity related to pressure, friction, moisture, and shear Goals: Patient will remain free from development of additional pressure ulcers Date Initiated: 08/14/2016 Target Resolution Date: 12/14/2016 Goal Status: Active Patient will remain free of pressure ulcers Date Initiated: 08/14/2016 Target Resolution Date: 12/14/2016 Goal Status: Active Patient/caregiver will verbalize risk factors for pressure ulcer development Date  Initiated: 08/14/2016 Target Resolution Date: 12/14/2016 Goal Status: Active Patient/caregiver will verbalize understanding of pressure ulcer management Date Initiated: 08/14/2016 Target Resolution Date: 12/14/2016 Goal Status: Active [name], [name] ([id number]) Interventions: Assess: immobility, friction, shearing, incontinence upon admission and as needed Assess offloading mechanisms upon admission and as needed Assess potential for pressure ulcer upon admission and as needed Provide education on pressure ulcers Treatment Activities: Patient referred for seating evaluation to ensure proper offloading : 08/14/2016 Pressure reduction/relief device ordered : 08/14/2016 Notes: ` Wound/Skin Impairment Nursing Diagnoses: Impaired tissue integrity Knowledge deficit related to ulceration/compromised skin integrity Goals: Patient/caregiver will verbalize understanding of skin care regimen Date Initiated: 08/14/2016 Target Resolution Date: 12/14/2016 Goal Status: Active Ulcer/skin breakdown will have a volume reduction of 30% by week 4 Date Initiated: 08/14/2016 Target Resolution Date: 12/14/2016 Goal Status: Active Ulcer/skin breakdown will have a volume reduction of 50% by week 8 Date Initiated: 08/14/2016 Target Resolution Date: 12/14/2016 Goal Status: Active Ulcer/skin breakdown will have a volume reduction of 80% by week 12 Date Initiated: 08/14/2016 Target Resolution Date: 12/14/2016 Goal Status: Active Ulcer/skin breakdown will heal within 14 weeks Date  Initiated: 08/14/2016 Target Resolution Date: 12/14/2016 Goal Status: Active Interventions: Assess patient/caregiver ability to obtain necessary supplies Assess patient/caregiver ability to perform ulcer/skin care regimen upon admission and as needed Assess ulceration(s) every visit Provide education on ulcer and skin care Treatment Activities: Referred to DME [name] for dressing supplies : 08/14/2016 Skin care regimen initiated :  08/14/2016 [name], [name] ([id number]) Topical wound management initiated : 08/14/2016 Notes: Electronic Signature(s) Signed: 10/15/2016 4:08:27 PM By: [name] Entered By: [name] on 10/15/2016 09:46:59 [name], [name] ([id number]) -------------------------------------------------------------------------------- Pain Assessment Details Patient Name: [name] Gathers T. Date of Service: 10/15/2016 9:45 AM Medical Record Number: [id number] Patient Account Number: [id number] Date of Birth/Sex: Oct 09, 1945 (71 y.o. Male) Treating RN: [name] Primary Care Westen Dinino: [name] Other Clinician: Referring Razia Screws: [name] Treating Inette Doubrava/Extender: [name] in Treatment: 8 Active Problems Location of Pain Severity and Description of Pain Patient Has Paino Yes Site Locations Pain Location: Pain in Ulcers With Dressing Change: Yes Rate the pain. Current Pain Level: 5 Character of Pain Describe the Pain: Aching Pain Management and Medication Current Pain Management: Electronic Signature(s) Signed: 10/15/2016 4:08:27 PM By: [name] Entered By: [name] on 10/15/2016 09:36:46 [place], [name] ([id number]) -------------------------------------------------------------------------------- Patient/Caregiver Education Details Patient Name: [name] Gathers T. Date of Service: 10/15/2016 9:45 AM Medical Record Number: [id number] Patient Account Number: [id number] Date of Birth/Gender: 1946/04/07 (71 y.o. Male) Treating RN: [name] Primary Care Physician: [name] Other Clinician: Referring Physician: [name] [name] Treating Physician/Extender: [name] in Treatment: 8 Education Assessment Education Provided To: Patient Education Topics Provided Wound/Skin Impairment: Handouts: Other: change dressing as ordered Methods: Demonstration, Explain/Verbal Responses: State content  correctly Electronic Signature(s) Signed: 10/15/2016 4:08:27 PM By: [name] Entered By: [name] on 10/15/2016 09:59:13 [name], [name] ([id number]) -------------------------------------------------------------------------------- Wound Assessment Details Patient Name: [name] Gathers T. Date of Service: 10/15/2016 9:45 AM Medical Record Number: [id number] Patient Account Number: [id number] Date of Birth/Sex: [birth year] (71 y.o. Male) Treating RN: [name] Primary Care Makilah Dowda: [name] Other Clinician: Referring Clemie General: [name] Treating Seraj Dunnam/Extender: [name] in Treatment: 8 Wound Status Wound Number: 1 Primary Diabetic Wound/Ulcer of the Lower Etiology: Extremity Wound Location: Right Knee - Anterior Wound Open Wounding Event: Pressure Injury Status: Date Acquired: 07/23/2016 Comorbid Anemia, Asthma, Arrhythmia, Weeks Of Treatment: 8 History: Congestive Heart Failure, Coronary Clustered Wound: No Artery Disease, Hypertension, Type II Diabetes, Osteoarthritis Photos Photo Uploaded By: [name] on 10/15/2016 16:00:51 Wound Measurements Length: (cm) 1.3 % Reduction in Width: (cm) 2.9 % Reduction in Depth: (cm) 0.6 Epithelializati Area: (cm) 2.961 Tunneling: Volume: (cm) 1.777 Undermining: Starting Pos Ending Posit Maximum Dist Area: 62.3% Volume: -126.4% on: None No Yes ition (o'clock): 6 ion (o'clock): 9 ance: (cm) 3.5 Wound Description Classification: Grade 1 Wound Margin: Flat and Intact Exudate Amount: Large Exudate Type: Serosanguineous Exudate Color: red, brown [name], [name] ([id number]) Foul Odor After Cleansing: No Slough/Fibrino Yes Wound Bed Granulation Amount: Large (67-100%) Exposed Structure Granulation Quality: Red Fascia Exposed: No Necrotic Amount: Small (1-33%) Fat Layer (Subcutaneous Tissue) Exposed: Yes Necrotic Quality: Adherent Slough Tendon Exposed: No Muscle  Exposed: No Joint Exposed: No Bone Exposed: No Periwound Skin Texture Texture Color No Abnormalities Noted: No No Abnormalities Noted: No Callus: No Atrophie Blanche: No Crepitus: No Cyanosis: No Excoriation: No Ecchymosis: No Induration: No Erythema: Yes Rash: No Erythema Location: Circumferential Scarring: No Hemosiderin Staining: No Mottled: No Moisture Pallor: No No Abnormalities Noted: No Rubor: No Dry / Scaly: No Maceration: No Temperature /  Pain Temperature: No Abnormality Tenderness on Palpation: Yes Wound Preparation Ulcer Cleansing: Rinsed/Irrigated with Saline Topical Anesthetic Applied: Other: Lidocaine 4%, Treatment Notes Wound #1 (Right, Anterior Knee) 1. Cleansed with: Clean wound with Normal Saline 2. Anesthetic Topical Lidocaine 4% cream to wound bed prior to debridement 4. Dressing Applied: Iodoform packing Gauze 5. Secondary Dressing Applied ABD Pad Dry Gauze Kerlix/Conform 7. Secured with Recruitment consultant) Signed: 10/15/2016 4:08:27 PM By: Marvis Repress ([id number]) Entered By: [name] on 10/15/2016 09:46:37 Arington, [name] ([id number]) -------------------------------------------------------------------------------- [place] Details Patient Name: [name] Gathers T. Date of Service: 10/15/2016 9:45 AM Medical Record Number: [id number] Patient Account Number: [id number] Date of Birth/Sex: [birth year] (71 y.o. Male) Treating RN: [name] Primary Care Salvador Coupe: [name] Other Clinician: Referring Toure Edmonds: [name] Treating Windy Dudek/Extender: [name] in Treatment: 8 Vital Signs Time Taken: 09:36 Temperature (F): 99.2 Height (in): 67 Pulse (bpm): 79 Weight (lbs): 357 Respiratory Rate (breaths/min): 16 Body Mass Index (BMI): 55.9 Blood Pressure (mmHg): 102/52 Reference Range: 80 - 120 mg / dl Electronic Signature(s) Signed: 10/15/2016 4:08:27 PM By: [name] Entered By: [name] on 10/15/2016 09:38:49

## 2016-10-20 ENCOUNTER — Other Ambulatory Visit: Payer: Self-pay

## 2016-10-22 ENCOUNTER — Encounter: Payer: Medicare Other | Admitting: Surgery

## 2016-10-23 ENCOUNTER — Ambulatory Visit: Payer: Self-pay | Admitting: Surgery

## 2016-10-23 NOTE — Progress Notes (Signed)
RIGHTEOUS, CLAIBORNE ([id number]) Visit Report for 10/22/2016 Arrival Information Details Patient Name: [name], [name]. Date of Service: 10/22/2016 10:30 AM Medical Record Number: [id number] Patient Account Number: [id number] Date of Birth/Sex: 1945-07-23 (71 y.o. Male) Treating RN: Ahmed Prima Primary Care Oaklyn Jakubek: [name] Dupre Other Clinician: Referring Danyela Posas: [name] Dupre Treating Correna Meacham/Extender: Frann Rider in Treatment: 9 Visit Information History Since Last Visit All ordered tests and consults were completed: No Patient Arrived: Wheel Chair Added or deleted any medications: No Arrival Time: 10:22 Any new allergies or adverse reactions: No Accompanied By: self Had a fall or experienced change in No Transfer Assistance: EasyPivot activities of daily living that may affect Patient Lift risk of falls: Patient Identification Verified: Yes Signs or symptoms of abuse/neglect since last No Secondary Verification Process Yes visito Completed: Hospitalized since last visit: No Patient Requires Transmission- No Has Dressing in Place as Prescribed: Yes Based Precautions: Pain Present Now: No Patient Has Alerts: No Electronic Signature(s) Signed: 10/22/2016 4:16:11 PM By: Alric Quan Entered By: Alric Quan on 10/22/2016 10:24:13 [name], [name] ([id number]) -------------------------------------------------------------------------------- Clinic Level of Care Assessment Details Patient Name: [name]. Date of Service: 10/22/2016 10:30 AM Medical Record Number: [id number] Patient Account Number: [id number] Date of Birth/Sex: [birth year] (71 y.o. Male) Treating RN: Ahmed Prima Primary Care Vauda Salvucci: [name] Dupre Other Clinician: Referring Garnet Chatmon: [name] Dupre Treating Trelon Plush/Extender: Frann Rider in Treatment: 9 Clinic Level of Care Assessment Items TOOL 4 Quantity Score X - Use when only an EandM is performed  on FOLLOW-UP visit 1 0 ASSESSMENTS - Nursing Assessment / Reassessment X - Reassessment of Co-morbidities (includes updates in patient status) 1 10 X - Reassessment of Adherence to Treatment Plan 1 5 ASSESSMENTS - Wound and Skin Assessment / Reassessment X - Simple Wound Assessment / Reassessment - one wound 1 5 []  - Complex Wound Assessment / Reassessment - multiple wounds 0 []  - Dermatologic / Skin Assessment (not related to wound area) 0 ASSESSMENTS - Focused Assessment []  - Circumferential Edema Measurements - multi extremities 0 []  - Nutritional Assessment / Counseling / Intervention 0 []  - Lower Extremity Assessment (monofilament, tuning fork, pulses) 0 []  - Peripheral Arterial Disease Assessment (using hand held doppler) 0 ASSESSMENTS - Ostomy and/or Continence Assessment and Care []  - Incontinence Assessment and Management 0 []  - Ostomy Care Assessment and Management (repouching, etc.) 0 PROCESS - Coordination of Care []  - Simple Patient / Family Education for ongoing care 0 X - Complex (extensive) Patient / Family Education for ongoing care 1 20 X - Staff obtains Programmer, systems, Records, Test Results / Process Orders 1 10 X - Staff telephones HHA, Nursing Homes / Clarify orders / etc 1 10 []  - Routine Transfer to another Facility (non-emergent condition) 0 [name], [name] ([id number]) []  - Routine Hospital Admission (non-emergent condition) 0 []  - New Admissions / Biomedical engineer / Ordering NPWT, Apligraf, etc. 0 []  - Emergency Hospital Admission (emergent condition) 0 X - Simple Discharge Coordination 1 10 []  - Complex (extensive) Discharge Coordination 0 PROCESS - Special Needs []  - Pediatric / Minor Patient Management 0 []  - Isolation Patient Management 0 []  - Hearing / Language / Visual special needs 0 []  - Assessment of Community assistance (transportation, D/C planning, etc.) 0 []  - Additional assistance / Altered mentation 0 []  - Support Surface(s) Assessment  (bed, cushion, seat, etc.) 0 INTERVENTIONS - Wound Cleansing / Measurement []  - Simple Wound Cleansing - one wound 0 X - Complex Wound Cleansing - multiple wounds 1 5  X - Wound Imaging (photographs - any number of wounds) 1 5 []  - Wound Tracing (instead of photographs) 0 X - Simple Wound Measurement - one wound 1 5 []  - Complex Wound Measurement - multiple wounds 0 INTERVENTIONS - Wound Dressings []  - Small Wound Dressing one or multiple wounds 0 X - Medium Wound Dressing one or multiple wounds 1 15 []  - Large Wound Dressing one or multiple wounds 0 X - Application of Medications - topical 1 5 []  - Application of Medications - injection 0 INTERVENTIONS - Miscellaneous []  - External ear exam 0 [name], [name] ([id number]) []  - Specimen Collection (cultures, biopsies, blood, body fluids, etc.) 0 []  - Specimen(s) / Culture(s) sent or taken to Lab for analysis 0 []  - Patient Transfer (multiple staff / Harrel Lemon Lift / Similar devices) 0 []  - Simple Staple / Suture removal (25 or less) 0 []  - Complex Staple / Suture removal (26 or more) 0 []  - Hypo / Hyperglycemic Management (close monitor of Blood Glucose) 0 []  - Ankle / Brachial Index (ABI) - do not check if billed separately 0 X - Vital Signs 1 5 Has the patient been seen at the hospital within the last three years: Yes Total Score: 110 Level Of Care: New/Established - Level 3 Electronic Signature(s) Signed: 10/22/2016 4:16:11 PM By: Alric Quan Entered By: Alric Quan on 10/22/2016 12:34:06 [name], [name] ([id number]) -------------------------------------------------------------------------------- Encounter Discharge Information Details Patient Name: [name] Gathers T. Date of Service: 10/22/2016 10:30 AM Medical Record Number: [id number] Patient Account Number: [id number] Date of Birth/Sex: 1945/10/01 (71 y.o. Male) Treating RN: Ahmed Prima Primary Care Krishauna Schatzman: [name] Dupre Other Clinician: Referring Janzen Sacks:  [name] Dupre Treating Brinleigh Tew/Extender: Frann Rider in Treatment: 9 Encounter Discharge Information Items Discharge Pain Level: 0 Discharge Condition: Stable Ambulatory Status: Wheelchair Discharge Destination: Nursing Home Transportation: Private Auto Accompanied By: self Schedule Follow-up Appointment: Yes Medication Reconciliation completed and provided to Patient/Care No [name]: Provided on Clinical Summary of Care: 10/22/2016 Form Type Recipient Paper Patient JE Electronic Signature(s) Signed: 10/22/2016 11:02:27 AM By: Ruthine Dose Entered By: Ruthine Dose on 10/22/2016 11:02:27 [name], [name] ([id number]) -------------------------------------------------------------------------------- Lower Extremity Assessment Details Patient Name: [name] Gathers T. Date of Service: 10/22/2016 10:30 AM Medical Record Number: [id number] Patient Account Number: [id number] Date of Birth/Sex: [birth year] (71 y.o. Male) Treating RN: Ahmed Prima Primary Care Abbe Bula: [name] Dupre Other Clinician: Referring Ladarrell Cornwall: [name] Dupre Treating Felton Buczynski/Extender: Frann Rider in Treatment: 9 Vascular Assessment Pulses: Dorsalis Pedis Palpable: [Right:Yes] Posterior Tibial Extremity colors, hair growth, and conditions: Extremity Color: [Right:Red] Temperature of Extremity: [Right:Warm] Capillary Refill: [Right:< 3 seconds] Electronic Signature(s) Signed: 10/22/2016 4:16:11 PM By: Alric Quan Entered By: Alric Quan on 10/22/2016 10:33:15 [name], [name] ([id number]) -------------------------------------------------------------------------------- Multi Wound Chart Details Patient Name: [name] Gathers T. Date of Service: 10/22/2016 10:30 AM Medical Record Number: [id number] Patient Account Number: [id number] Date of Birth/Sex: [birth year] (71 y.o. Male) Treating RN: Ahmed Prima Primary Care Linkin Vizzini: [name] Dupre Other  Clinician: Referring Kasheena Sambrano: [name] Dupre Treating Alyviah Crandle/Extender: Frann Rider in Treatment: 9 Vital Signs Height(in): 67 Pulse(bpm): 79 Weight(lbs): 357 Blood Pressure 106/46 (mmHg): Body Mass Index(BMI): 56 Temperature(F): 98.6 Respiratory Rate 16 (breaths/min): Photos: [1:No Photos] [N/A:N/A] Wound Location: [1:Right Knee - Anterior] [N/A:N/A] Wounding Event: [1:Pressure Injury] [N/A:N/A] Primary Etiology: [1:Diabetic Wound/Ulcer of the Lower Extremity] [N/A:N/A] Comorbid History: [1:Anemia, Asthma, Arrhythmia, Congestive Heart Failure, Coronary Artery Disease, Hypertension, Type II Diabetes, Osteoarthritis] [N/A:N/A] Date Acquired: [1:07/23/2016] [N/A:N/A] Weeks of Treatment: [1:9] [N/A:N/A] Wound Status: [1:Open] [N/A:N/A] Measurements L  x W x D 1.1x2.7x0.6 [N/A:N/A] (cm) Area (cm) : [1:2.333] [N/A:N/A] Volume (cm) : [1:1.4] [N/A:N/A] % Reduction in Area: [1:70.30%] [N/A:N/A] % Reduction in Volume: -78.30% [N/A:N/A] Starting Position 1 4 (o'clock): Ending Position 1 [1:9] (o'clock): Maximum Distance 1 3.2 (cm): Undermining: [1:Yes] [N/A:N/A] Classification: [1:Grade 1] [N/A:N/A] Exudate Amount: [1:Large] [N/A:N/A] Exudate Type: [1:Serosanguineous] [N/A:N/A] Exudate Color: red, brown N/A N/A Wound Margin: Flat and Intact N/A N/A Granulation Amount: Large (67-100%) N/A N/A Granulation Quality: Red N/A N/A Necrotic Amount: Small (1-33%) N/A N/A Exposed Structures: Fat Layer (Subcutaneous N/A N/A Tissue) Exposed: Yes Fascia: No Tendon: No Muscle: No Joint: No Bone: No Epithelialization: None N/A N/A Periwound Skin Texture: Excoriation: No N/A N/A Induration: No Callus: No Crepitus: No Rash: No Scarring: No Periwound Skin Maceration: No N/A N/A Moisture: Dry/Scaly: No Periwound Skin Color: Erythema: Yes N/A N/A Atrophie Blanche: No Cyanosis: No Ecchymosis: No Hemosiderin Staining: No Mottled: No Pallor: No Rubor: No Erythema  Location: Circumferential N/A N/A Temperature: No Abnormality N/A N/A Tenderness on Yes N/A N/A Palpation: Wound Preparation: Ulcer Cleansing: N/A N/A Rinsed/Irrigated with Saline Topical Anesthetic Applied: Other: Lidocaine 4% Treatment Notes Wound #1 (Right, Anterior Knee) 1. Cleansed with: Clean wound with Normal Saline 2. Anesthetic Topical Lidocaine 4% cream to wound bed prior to debridement 4. Dressing Applied: Aquacel Ag Maita, Nickholas T. ([id number]) 5. Secondary Dressing Applied ABD Pad Dry Gauze Kerlix/Conform 7. Secured with Recruitment consultant) Signed: 10/22/2016 11:46:23 AM By: Christin Fudge MD, FACS Entered By: Christin Fudge on 10/22/2016 11:46:22 [name], [name] ([id number]) -------------------------------------------------------------------------------- [place] Details Patient Name: [name] Gathers T. Date of Service: 10/22/2016 10:30 AM Medical Record Number: [id number] Patient Account Number: [id number] Date of Birth/Sex: [birth year] (71 y.o. Male) Treating RN: Carolyne Fiscal, Debi Primary Care Mercede Rollo: [name] Dupre Other Clinician: Referring Vinie Charity: [name] Dupre Treating Seraj Dunnam/Extender: Frann Rider in Treatment: 9 Active Inactive ` Orientation to the Wound Care Program Nursing Diagnoses: Knowledge deficit related to the wound healing center program Goals: Patient/caregiver will verbalize understanding of the [facility] Date Initiated: 08/14/2016 Target Resolution Date: 12/14/2016 Goal Status: Active Interventions: Provide education on orientation to the wound center Notes: ` Pressure Nursing Diagnoses: Knowledge deficit related to causes and risk factors for pressure ulcer development Knowledge deficit related to management of pressures ulcers Potential for impaired tissue integrity related to pressure, friction, moisture, and shear Goals: Patient will remain free from  development of additional pressure ulcers Date Initiated: 08/14/2016 Target Resolution Date: 12/14/2016 Goal Status: Active Patient will remain free of pressure ulcers Date Initiated: 08/14/2016 Target Resolution Date: 12/14/2016 Goal Status: Active Patient/caregiver will verbalize risk factors for pressure ulcer development Date Initiated: 08/14/2016 Target Resolution Date: 12/14/2016 Goal Status: Active Patient/caregiver will verbalize understanding of pressure ulcer management Date Initiated: 08/14/2016 Target Resolution Date: 12/14/2016 Goal Status: Active [name], [name] ([id number]) Interventions: Assess: immobility, friction, shearing, incontinence upon admission and as needed Assess offloading mechanisms upon admission and as needed Assess potential for pressure ulcer upon admission and as needed Provide education on pressure ulcers Treatment Activities: Patient referred for seating evaluation to ensure proper offloading : 08/14/2016 Pressure reduction/relief device ordered : 08/14/2016 Notes: ` Wound/Skin Impairment Nursing Diagnoses: Impaired tissue integrity Knowledge deficit related to ulceration/compromised skin integrity Goals: Patient/caregiver will verbalize understanding of skin care regimen Date Initiated: 08/14/2016 Target Resolution Date: 12/14/2016 Goal Status: Active Ulcer/skin breakdown will have a volume reduction of 30% by week 4 Date Initiated: 08/14/2016 Target Resolution Date: 12/14/2016 Goal Status: Active Ulcer/skin breakdown will have  a volume reduction of 50% by week 8 Date Initiated: 08/14/2016 Target Resolution Date: 12/14/2016 Goal Status: Active Ulcer/skin breakdown will have a volume reduction of 80% by week 12 Date Initiated: 08/14/2016 Target Resolution Date: 12/14/2016 Goal Status: Active Ulcer/skin breakdown will heal within 14 weeks Date Initiated: 08/14/2016 Target Resolution Date: 12/14/2016 Goal Status: Active Interventions: Assess  patient/caregiver ability to obtain necessary supplies Assess patient/caregiver ability to perform ulcer/skin care regimen upon admission and as needed Assess ulceration(s) every visit Provide education on ulcer and skin care Treatment Activities: Referred to DME Lynna Zamorano for dressing supplies : 08/14/2016 Skin care regimen initiated : 08/14/2016 [name], [name] ([id number]) Topical wound management initiated : 08/14/2016 Notes: Electronic Signature(s) Signed: 10/22/2016 4:16:11 PM By: Alric Quan Entered By: Alric Quan on 10/22/2016 10:38:07 [name], [name] ([id number]) -------------------------------------------------------------------------------- Pain Assessment Details Patient Name: [name] Gathers T. Date of Service: 10/22/2016 10:30 AM Medical Record Number: [id number] Patient Account Number: [id number] Date of Birth/Sex: 07/19/1945 (71 y.o. Male) Treating RN: Ahmed Prima Primary Care Anaijah Augsburger: [name] Dupre Other Clinician: Referring Sabiha Sura: [name] Dupre Treating Sunaina Ferrando/Extender: Frann Rider in Treatment: 9 Active Problems Location of Pain Severity and Description of Pain Patient Has Paino No Site Locations With Dressing Change: No Pain Management and Medication Current Pain Management: Electronic Signature(s) Signed: 10/22/2016 4:16:11 PM By: Alric Quan Entered By: Alric Quan on 10/22/2016 10:24:19 Hughett, [name] ([id number]) -------------------------------------------------------------------------------- Patient/Caregiver Education Details Patient Name: [name]. Date of Service: 10/22/2016 10:30 AM Medical Record Number: [id number] Patient Account Number: [id number] Date of Birth/Gender: [birth year] (72 y.o. Male) Treating RN: Ahmed Prima Primary Care Physician: [name] Dupre Other Clinician: Referring Physician: [name] Dupre Treating Physician/Extender: Frann Rider in Treatment:  9 Education Assessment Education Provided To: Patient Education Topics Provided Wound/Skin Impairment: Handouts: Other: change dressing as ordered Methods: Demonstration, Explain/Verbal Responses: State content correctly Electronic Signature(s) Signed: 10/22/2016 4:16:11 PM By: Alric Quan Entered By: Alric Quan on 10/22/2016 10:47:34 Zavalza, [name] ([id number]) -------------------------------------------------------------------------------- Wound Assessment Details Patient Name: [name] Gathers T. Date of Service: 10/22/2016 10:30 AM Medical Record Number: [id number] Patient Account Number: [id number] Date of Birth/Sex: 04-19-1946 (71 y.o. Male) Treating RN: Ahmed Prima Primary Care Andromeda Poppen: [name] Dupre Other Clinician: Referring Daviel Allegretto: [name] Dupre Treating Mahlani Berninger/Extender: Frann Rider in Treatment: 9 Wound Status Wound Number: 1 Primary Diabetic Wound/Ulcer of the Lower Etiology: Extremity Wound Location: Right Knee - Anterior Wound Open Wounding Event: Pressure Injury Status: Date Acquired: 07/23/2016 Comorbid Anemia, Asthma, Arrhythmia, Weeks Of Treatment: 9 History: Congestive Heart Failure, Coronary Clustered Wound: No Artery Disease, Hypertension, Type II Diabetes, Osteoarthritis Photos Photo Uploaded By: Alric Quan on 10/22/2016 15:01:06 Wound Measurements Length: (cm) 1.1 % Reduction in Width: (cm) 2.7 % Reduction in Depth: (cm) 0.6 Epithelializati Area: (cm) 2.333 Tunneling: Volume: (cm) 1.4 Undermining: Starting Pos Ending Posit Maximum Dist Area: 70.3% Volume: -78.3% on: None No Yes ition (o'clock): 4 ion (o'clock): 9 ance: (cm) 3.2 Wound Description Classification: Grade 1 Wound Margin: Flat and Intact Exudate Amount: Large Exudate Type: Serosanguineous Exudate Color: red, brown Lazare, Elver T. ([id number]) Foul Odor After Cleansing: No Slough/Fibrino Yes Wound Bed Granulation Amount:  Large (67-100%) Exposed Structure Granulation Quality: Red Fascia Exposed: No Necrotic Amount: Small (1-33%) Fat Layer (Subcutaneous Tissue) Exposed: Yes Necrotic Quality: Adherent Slough Tendon Exposed: No Muscle Exposed: No Joint Exposed: No Bone Exposed: No Periwound Skin Texture Texture Color No Abnormalities Noted: No No Abnormalities Noted: No Callus: No Atrophie Blanche: No Crepitus: No Cyanosis: No Excoriation: No Ecchymosis: No Induration: No Erythema:  Yes Rash: No Erythema Location: Circumferential Scarring: No Hemosiderin Staining: No Mottled: No Moisture Pallor: No No Abnormalities Noted: No Rubor: No Dry / Scaly: No Maceration: No Temperature / Pain Temperature: No Abnormality Tenderness on Palpation: Yes Wound Preparation Ulcer Cleansing: Rinsed/Irrigated with Saline Topical Anesthetic Applied: Other: Lidocaine 4%, Treatment Notes Wound #1 (Right, Anterior Knee) 1. Cleansed with: Clean wound with Normal Saline 2. Anesthetic Topical Lidocaine 4% cream to wound bed prior to debridement 4. Dressing Applied: Aquacel Ag 5. Secondary Dressing Applied ABD Pad Dry Gauze Kerlix/Conform 7. Secured with Recruitment consultant) Signed: 10/22/2016 4:16:11 PM By: Marvis Repress ([id number]) Entered By: Alric Quan on 10/22/2016 10:32:55 Pollet, [name] ([id number]) -------------------------------------------------------------------------------- Vitals Details Patient Name: [name] Gathers T. Date of Service: 10/22/2016 10:30 AM Medical Record Number: [id number] Patient Account Number: [id number] Date of Birth/Sex: 1945/11/15 (71 y.o. Male) Treating RN: Ahmed Prima Primary Care Messiah Rovira: [name] Dupre Other Clinician: Referring Camara Rosander: [name] Dupre Treating Gatsby Chismar/Extender: Frann Rider in Treatment: 9 Vital Signs Time Taken: 10:24 Temperature (F): 98.6 Height (in): 67 Pulse (bpm): 79 Weight  (lbs): 357 Respiratory Rate (breaths/min): 16 Body Mass Index (BMI): 55.9 Blood Pressure (mmHg): 106/46 Reference Range: 80 - 120 mg / dl Notes Made Dr. Con Memos aware of pts BP. Electronic Signature(s) Signed: 10/22/2016 4:16:11 PM By: Alric Quan Entered By: Alric Quan on 10/22/2016 10:25:19

## 2016-10-24 NOTE — Progress Notes (Signed)
TYREN, DUGAR ([id number]) Visit Report for 10/22/2016 Chief Complaint Document Details Patient Name: [name], [name]. Date of Service: 10/22/2016 10:30 AM Medical Record Number: [id number] Patient Account Number: [id number] Date of Birth/Sex: 06/23/1945 (71 y.o. Male) Treating RN: Ahmed Prima Primary Care Provider: [name] Dupre Other Clinician: Referring Provider: [name] Dupre Treating Provider/Extender: Frann Rider in Treatment: 9 Information Obtained from: Patient Chief Complaint Patient is at the clinic for treatment of an open pressure ulcer to the area of the right knee for about 3 weeks now Electronic Signature(s) Signed: 10/22/2016 11:46:31 AM By: Christin Fudge MD, FACS Entered By: Christin Fudge on 10/22/2016 11:46:30 Bucklew, [name] ([id number]) -------------------------------------------------------------------------------- HPI Details Patient Name: [name] T. Date of Service: 10/22/2016 10:30 AM Medical Record Number: [id number] Patient Account Number: [id number] Date of Birth/Sex: 12-09-1945 (71 y.o. Male) Treating RN: Ahmed Prima Primary Care Provider: [name] Dupre Other Clinician: Referring Provider: [name] Dupre Treating Provider/Extender: Frann Rider in Treatment: 9 History of Present Illness Location: right knee swelling with skin ulceration Quality: Patient reports experiencing a dull pain to affected area(s). Severity: Patient states wound are getting worse. Duration: Patient has had the wound for <4 weeks prior to presenting for treatment Timing: Pain in wound is Intermittent (comes and goes Context: The wound occurred when the patient had a cast placed over his right lower extremity and with the cast was removed he had a swelling of the knee with problems with his skin Modifying Factors: Other treatment(s) tried include:orthopedics Associated Signs and Symptoms: swelling of both lower extremities with large  amount of swelling of the knee HPI Description: 71 year old gentleman who has been living in an independent living facility for long-term care has had recurrent falls and recently had a large hematoma on the right knee. Known to be on aspirin and Plavix for history of CABG and was seen in the ER at the end of February after a fall. past medical history of arthritis, congestive heart failure, diabetes mellitus, hypertension, status post CABG o2, status post cholecystectomy, hernia repair and kidney surgery. recent workup for the fall showed negative clinical findings in his extremities and it was thought that he was falling due to deconditioning. a x-ray showed a right fibular neck fracture and was placed in a splint. he was asked to be seen by orthopedic surgeon. the patient cannot tell me which orthopedic doctor had seen him and I'm not able to get any notes from [place], regarding his orthopedic care. The patient says twice during this last week he has had bloody fluid shooting out of his knee and has drained all over his boot and the flow. 08/21/2016 -- he was seen by the nurse practitioner at the facility and and distend she aspirated about 60 mL of hemorrhagic fluid from his right knee. 08/31/2016 -- since I saw the patient last he was admitted to the hospital at [facility] between 08/23/2016 2 08/27/2016 and was treated with a pulmonary embolism. he had EKOS complete, was started on Xarelto, and also treated for chronic diastolic congestive heart failure. he was asked to follow-up with the wound center and continue management locally. 09/07/2016 -- he has an appointment to see his orthopedic surgeon later this week. 09/14/2016 -- he did see the orthopedic service and they have asked him to continue with physical therapy. They discontinued the boot and the x-ray done showed a healed fracture. They said it was okay to use a wound VAC if needed. Addendum -- we got the note from the  Orthopedic  office where he was seen by [facility] orthopedics on [facility] in [place] and was seen by the PA Carlynn Spry. -- The x-ray which was done was reviewed and there is good healing of the fracture and from the orthopedic point of view he has healed well and they have asked him to continue with good control of his diabetes and management by the wound center including using a wound VAC if required. DEVONDRE, GUZZETTA ([id number]) 09/21/2016 -- the patient's wound VAC had been applied 2 days ago, but the black sponge had not been placed laterally under the skin flap and into the area of the right of the knee, where there was a large cavity. On examination, after removal of his wound VAC, there is a large collection of seropurulent fluid and hematoma in the area starting at the lateral knee and going down to his right lateral compartment of the leg. Clinically, this is a huge infected abscess cavity 10/01/2016 -- patient was admitted between 09/21/2016 and 09/23/2016 with cellulitis of the right lower extremity with a large infected hematoma which was drained at the bedside by surgical services. he was treated with Levaquin and continues to be on Xarelto. the patient is back on a wound VAC which has not been functioning due to the large depth of the hematoma and organized clots. He is still on Keflex orally. Addendum : I spoke to his surgeon Dr. Tama High and we discussed options for further draining this large hematoma which I suspect will get infected sooner than later. I expressed the fact that the wound VAC does not seem to be functioning well and competent enough to drain this large cavity. He will call for him in the office. [id number] 2018 -- the patient has hypotension in the sitting position and is a bit lethargic as compared to his normal self. I spoke personally to his nurse at the nursing homes, Ms Judson Roch and discussed his management including the fact that he had to have a surgical  consultation for the right lower leg, with Dr. Tama High. Prior to the patient's discharge from the wound center he continues to have hypotension and hence I'm going to talk to the ER physician and have him evaluated in the ER. 10/15/2016 -- the patient was admitted to the hospital between 10/08/2016 and 10/12/2016. He was treated for hypokalemia, hyponatremia, renal insufficiency, sepsis and possible abscess of the right lower leg. he was seen by surgical services and the fluid collection was drained with a Yankauer suction from the knee wound and the opinion of the surgeons was that he would not need any surgical intervention and continue with local wound care. I had personally communicated with Dr. Tama High regarding this. the patient is still on Xarelto. During the admission he was also seen by Dr. Ola Spurr for infectious disease consult and he recommended continue vancomycin for MRSA and change meropenem to ceftriaxone for this Proteus. He recommended a four-week course of antibiotic therapy and follow-up back with Dr. Ola Spurr in 4 weeks. before his discharge since there was clinical improvement Dr. Ola Spurr converted the treatment to oral clindamycin and oral Keflex for a four-week course of antibiotic therapy. He was to be monitored for C. difficile. Electronic Signature(s) Signed: 10/22/2016 11:46:38 AM By: Christin Fudge MD, FACS Entered By: Christin Fudge on 10/22/2016 11:46:37 Waring, [name] ([id number]) -------------------------------------------------------------------------------- Physical Exam Details Patient Name: [name] T. Date of Service: 10/22/2016 10:30 AM Medical Record Number: [id number] Patient Account Number: [id number]  Date of Birth/Sex: 1945-08-15 (71 y.o. Male) Treating RN: Carolyne Fiscal, Debi Primary Care Provider: [name] Dupre Other Clinician: Referring Provider: [name] Dupre Treating Provider/Extender: Frann Rider in Treatment:  9 Constitutional . Pulse regular. Respirations normal and unlabored. Afebrile. . Eyes Nonicteric. Reactive to light. Ears, Nose, Mouth, and Throat Lips, teeth, and gums WNL.[name] Moist mucosa without lesions. Neck supple and nontender. No palpable supraclavicular or cervical adenopathy. Normal sized without goiter. Respiratory WNL. No retractions.. Breath sounds WNL, No rubs, rales, rhonchi, or wheeze.. Cardiovascular Heart rhythm and rate regular, no murmur or gallop.. Pedal Pulses WNL. No clubbing, cyanosis or edema. Chest Breasts symmetical and no nipple discharge.. Breast tissue WNL, no masses, lumps, or tenderness.. Lymphatic No adneopathy. No adenopathy. No adenopathy. Musculoskeletal Adexa without tenderness or enlargement.. Digits and nails w/o clubbing, cyanosis, infection, petechiae, ischemia, or inflammatory conditions.. Integumentary (Hair, Skin) No suspicious lesions. No crepitus or fluctuance. No peri-wound warmth or erythema. No masses.[name] Psychiatric Judgement and insight Intact.. No evidence of depression, anxiety, or agitation.. Notes the wound of the knee has sealed off at the superior edge of the leg and does not communicate with the hematoma in the right lateral compartment. The fluid collection last week is now more of an organized hematoma and there is no evidence of any cellulitis or abscess Electronic Signature(s) Signed: 10/22/2016 11:47:48 AM By: Christin Fudge MD, FACS Entered By: Christin Fudge on 10/22/2016 11:47:48 Tarquinio, [name] ([id number]) -------------------------------------------------------------------------------- Physician Orders Details Patient Name: [name] T. Date of Service: 10/22/2016 10:30 AM Medical Record Number: [id number] Patient Account Number: [id number] Date of Birth/Sex: [birth year] (71 y.o. Male) Treating RN: Ahmed Prima Primary Care Provider: [name] Dupre Other Clinician: Referring Provider: [name] Dupre Treating Provider/Extender: Frann Rider in Treatment: 9 Verbal / Phone Orders: Yes Clinician: Carolyne Fiscal, Debi Read Back and Verified: Yes Diagnosis Coding Wound Cleansing Wound #1 Right,Anterior Knee o Clean wound with Normal Saline. o Cleanse wound with mild soap and water Anesthetic Wound #1 Right,Anterior Knee o Topical Lidocaine 4% cream applied to wound bed prior to debridement - Only in the Wound Clinic Primary Wound Dressing Wound #1 Right,Anterior Knee o Aquacel Ag - silver alginate Secondary Dressing Wound #1 Right,Anterior Knee o ABD pad o Dry Gauze o Conform/Kerlix o Other - tape Dressing Change Frequency Wound #1 Right,Anterior Knee o Change dressing every day. o Other: - as needed Follow-up Appointments Wound #1 Right,Anterior Knee o Return Appointment in 1 week. o Other: - ******Please make sure pt gets to his appointment tomorrow Friday 10/23/16 with Dr. Hampton Abbot (surgical)****** Edema Control Wound #1 Right,Anterior Knee o Elevate legs to the level of the heart and pump ankles as often as possible Astarita, Thaddius T. ([id number]) Additional Orders / Instructions Wound #1 Right,Anterior Knee o Increase protein intake. o Activity as tolerated Medications-please add to medication list. Wound #1 Right,Anterior Knee o P.O. Antibiotics - continue oral antibiotics as ordered o Other: - Vitamin C, Zinc, MVI Electronic Signature(s) Signed: 10/22/2016 4:16:11 PM By: Alric Quan Signed: 10/22/2016 4:34:04 PM By: Christin Fudge MD, FACS Entered By: Alric Quan on 10/22/2016 10:40:35 Lanni, [name] ([id number]) -------------------------------------------------------------------------------- Problem List Details Patient Name: [name], [name] Date of Service: 10/22/2016 10:30 AM Medical Record Number: [id number] Patient Account Number: [id number] Date of Birth/Sex: 03/16/1946 (71 y.o. 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, right knee 08/14/2016 Yes L97.812 Non-pressure chronic ulcer of other part of right lower leg 08/14/2016 Yes with fat layer exposed L89.892 Pressure ulcer of other site, stage 2 08/14/2016 Yes M70.861 Other soft tissue disorders related to use, overuse and 08/14/2016 Yes pressure, right lower leg M71.061 Abscess of bursa, right knee 09/21/2016 Yes Inactive Problems Resolved Problems Electronic Signature(s) Signed: 10/22/2016 11:46:17 AM By: Christin Fudge MD, FACS Entered By: Christin Fudge on 10/22/2016 11:46:17 Prochnow, [name] ([id number]) -------------------------------------------------------------------------------- Progress Note Details Patient Name: [name] T. Date of Service: 10/22/2016 10:30 AM Medical Record Number: [id number] Patient Account Number: [id number] Date of Birth/Sex: 08/19/1945 (71 y.o. Male) Treating RN: Ahmed Prima Primary Care Provider: [name] Dupre Other Clinician: Referring Provider: [name] Dupre Treating Provider/Extender: Frann Rider in Treatment: 9 Subjective Chief Complaint Information obtained from Patient Patient is at the clinic for treatment of an open pressure ulcer to the area of the right knee for about 3 weeks now History of Present Illness (HPI) The following HPI elements were documented for the patient's wound: Location: right knee swelling with skin ulceration Quality: Patient reports experiencing a dull pain to affected area(s). Severity: Patient states wound are getting worse. Duration: Patient has had the wound for Timing: Pain in wound is Intermittent (comes and  goes Context: The wound occurred when the patient had a cast placed over his right lower extremity and with the cast was removed he had a swelling of the knee with problems with his skin Modifying Factors: Other treatment(s) tried include:orthopedics Associated Signs and Symptoms: swelling of both lower extremities with large amount of swelling of the knee 71 year old gentleman who has been living in an independent living facility for long-term care has had recurrent falls and recently had a large hematoma on the right knee. Known to be on aspirin and Plavix for history of CABG and was seen in the ER at the end of February after a fall. past medical history of arthritis, congestive heart failure, diabetes mellitus, hypertension, status post CABG o2, status post cholecystectomy, hernia repair and kidney surgery. recent workup for the fall showed negative clinical findings in his extremities and it was thought that he was falling due to deconditioning. a x-ray showed a right fibular neck fracture and was placed in a splint. he was asked to be seen by orthopedic surgeon. the patient cannot tell me which orthopedic doctor had seen him and I'm not able to get any notes from [place], regarding his orthopedic care. The patient says twice during this last week he has had bloody fluid shooting out of his knee and has drained all over his boot and the flow. 08/21/2016 -- he was seen by the nurse practitioner at the facility and and distend she aspirated about 60 mL of hemorrhagic fluid from his right knee. 08/31/2016 -- since I saw the patient last he was admitted to the hospital at [facility] between 08/23/2016 2 08/27/2016 and was treated with a pulmonary embolism. he had EKOS complete, was started on Xarelto, and also treated for chronic diastolic congestive heart failure. he was asked to follow-up with the wound center and continue management locally. 09/07/2016 -- he has an appointment to see his  orthopedic surgeon later this week. 09/14/2016 -- he did see the orthopedic service and they have asked him to continue with physical therapy. KAYSTON, JODOIN ([id number]) They discontinued the boot and the x-ray done showed a healed fracture. They said it was  okay to use a wound VAC if needed. Addendum -- we got the note from the Orthopedic office where he was seen by [facility] orthopedics on [facility] in [place] and was seen by the PA Carlynn Spry. -- The x-ray which was done was reviewed and there is good healing of the fracture and from the orthopedic point of view he has healed well and they have asked him to continue with good control of his diabetes and management by the wound center including using a wound VAC if required. 09/21/2016 -- the patient's wound VAC had been applied 2 days ago, but the black sponge had not been placed laterally under the skin flap and into the area of the right of the knee, where there was a large cavity. On examination, after removal of his wound VAC, there is a large collection of seropurulent fluid and hematoma in the area starting at the lateral knee and going down to his right lateral compartment of the leg. Clinically, this is a huge infected abscess cavity 10/01/2016 -- patient was admitted between 09/21/2016 and 09/23/2016 with cellulitis of the right lower extremity with a large infected hematoma which was drained at the bedside by surgical services. he was treated with Levaquin and continues to be on Xarelto. the patient is back on a wound VAC which has not been functioning due to the large depth of the hematoma and organized clots. He is still on Keflex orally. Addendum : I spoke to his surgeon Dr. Tama High and we discussed options for further draining this large hematoma which I suspect will get infected sooner than later. I expressed the fact that the wound VAC does not seem to be functioning well and competent enough to drain this  large cavity. He will call for him in the office. [id number] 2018 -- the patient has hypotension in the sitting position and is a bit lethargic as compared to his normal self. I spoke personally to his nurse at the nursing homes, Ms Judson Roch and discussed his management including the fact that he had to have a surgical consultation for the right lower leg, with Dr. Tama High. Prior to the patient's discharge from the wound center he continues to have hypotension and hence I'm going to talk to the ER physician and have him evaluated in the ER. 10/15/2016 -- the patient was admitted to the hospital between 10/08/2016 and 10/12/2016. He was treated for hypokalemia, hyponatremia, renal insufficiency, sepsis and possible abscess of the right lower leg. he was seen by surgical services and the fluid collection was drained with a Yankauer suction from the knee wound and the opinion of the surgeons was that he would not need any surgical intervention and continue with local wound care. I had personally communicated with Dr. Tama High regarding this. the patient is still on Xarelto. During the admission he was also seen by Dr. Ola Spurr for infectious disease consult and he recommended continue vancomycin for MRSA and change meropenem to ceftriaxone for this Proteus. He recommended a four-week course of antibiotic therapy and follow-up back with Dr. Ola Spurr in 4 weeks. before his discharge since there was clinical improvement Dr. Ola Spurr converted the treatment to oral clindamycin and oral Keflex for a four-week course of antibiotic therapy. He was to be monitored for C. difficile. Objective Brideau, Dallen T. ([id number]) Constitutional Pulse regular. Respirations normal and unlabored. Afebrile. Vitals Time Taken: 10:24 AM, Height: 67 in, Weight: 357 lbs, BMI: 55.9, Temperature: 98.6 F, Pulse: 79 bpm, Respiratory Rate:  16 breaths/min, Blood Pressure: 106/46 mmHg. General Notes: Made Dr. Con Memos  aware of pts BP. Eyes Nonicteric. Reactive to light. Ears, Nose, Mouth, and Throat Lips, teeth, and gums WNL.[name] Moist mucosa without lesions. Neck supple and nontender. No palpable supraclavicular or cervical adenopathy. Normal sized without goiter. Respiratory WNL. No retractions.. Breath sounds WNL, No rubs, rales, rhonchi, or wheeze.. Cardiovascular Heart rhythm and rate regular, no murmur or gallop.. Pedal Pulses WNL. No clubbing, cyanosis or edema. Chest Breasts symmetical and no nipple discharge.. Breast tissue WNL, no masses, lumps, or tenderness.. Lymphatic No adneopathy. No adenopathy. No adenopathy. Musculoskeletal Adexa without tenderness or enlargement.. Digits and nails w/o clubbing, cyanosis, infection, petechiae, ischemia, or inflammatory conditions.[name] Psychiatric Judgement and insight Intact.. No evidence of depression, anxiety, or agitation.. General Notes: the wound of the knee has sealed off at the superior edge of the leg and does not communicate with the hematoma in the right lateral compartment. The fluid collection last week is now more of an organized hematoma and there is no evidence of any cellulitis or abscess Integumentary (Hair, Skin) No suspicious lesions. No crepitus or fluctuance. No peri-wound warmth or erythema. No masses.. Wound #1 status is Open. Original cause of wound was Pressure Injury. The wound is located on the Right,Anterior Knee. The wound measures 1.1cm length x 2.7cm width x 0.6cm depth; 2.333cm^2 area and 1.4cm^3 volume. There is Fat Layer (Subcutaneous Tissue) Exposed exposed. There is no tunneling noted, however, there is undermining starting at 4:00 and ending at 9:00 with a maximum distance of 3.2cm. There is a large amount of serosanguineous drainage noted. The wound margin is flat and intact. There is large (67-100%) red granulation within the wound bed. There is a small (1-33%) amount of necrotic tissue within [name], [name]  ([id number]) the wound bed including Adherent Slough. The periwound skin appearance exhibited: Erythema. The periwound skin appearance did not exhibit: Callus, Crepitus, Excoriation, Induration, Rash, Scarring, Dry/Scaly, Maceration, Atrophie Blanche, Cyanosis, Ecchymosis, Hemosiderin Staining, Mottled, Pallor, Rubor. The surrounding wound skin color is noted with erythema which is circumferential. Periwound temperature was noted as No Abnormality. The periwound has tenderness on palpation. 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 Cleanse wound with mild soap and water Anesthetic: Wound #1 Right,Anterior Knee: Topical Lidocaine 4% cream applied to wound bed prior to debridement - Only in the Wound Clinic Primary Wound Dressing: Wound #1 Right,Anterior Knee: Aquacel Ag - silver alginate Secondary Dressing: Wound #1 Right,Anterior Knee: ABD pad Dry Gauze Conform/Kerlix Other - tape Dressing Change Frequency: Wound #1 Right,Anterior Knee: Change dressing every day. Other: - as needed Follow-up Appointments: Wound #1 Right,Anterior Knee: Schweiger, Areon T. ([id number]) Return Appointment in 1 week. Other: - ******Please make sure pt gets to his appointment tomorrow Friday 10/23/16 with Dr. Hampton Abbot (surgical)****** Edema Control: Wound #1 Right,Anterior Knee: Elevate legs to the level of the heart and pump ankles as often as possible Additional Orders / Instructions: Wound #1 Right,Anterior Knee: Increase protein intake. Activity as  tolerated Medications-please add to medication list.: Wound #1 Right,Anterior Knee: P.O. Antibiotics - continue oral antibiotics  as ordered Other: - Vitamin C, Zinc, MVI After careful examination of the patient's right lower extremity, I have recommended: 1. Packing the wound at the knee with Silver alginate rope to be changed daily and when necessary. 2. The wound has sealed off at a level of the knee and packing the wound into the lateral compartment, in the wound center or by the nurses at the nursing home, is impossible. 3. there is organized hematoma in the right lateral compartment cavity and will refer him back to Dr. Tama High or his colleagues in the office as soon as possible. I understand the appointment is tomorrow 4. Continue with oral antibiotics as per Dr. Ola Spurr. 5. Review in the wound center once an appropriate surgical consultation has taken place. Electronic Signature(s) Signed: 10/22/2016 11:50:00 AM By: Christin Fudge MD, FACS Entered By: Christin Fudge on 10/22/2016 11:50:00 Burdette, [name] ([id number]) -------------------------------------------------------------------------------- SuperBill Details Patient Name: [name] T. Date of Service: 10/22/2016 Medical Record Number: [id number] Patient Account Number: [id number] Date of Birth/Sex: 24-Mar-1946 (71 y.o. 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 L89.892 Pressure ulcer of other site, stage 2 M70.861 Other soft tissue disorders related to use, overuse and pressure, right lower leg M71.061 Abscess of bursa, right  knee Facility Procedures CPT4 Code: 09983382 Description: 99213 - WOUND CARE VISIT-LEV 3 EST PT Modifier: Quantity: 1 Physician Procedures CPT4: Description Modifier Quantity Code [id number] 73419 - WC PHYS LEVEL 3 - EST PT 1 ICD-10 Description Diagnosis E11.622 Type 2 diabetes mellitus with other skin ulcer L97.812 Non-pressure chronic ulcer of other part of right lower leg with fat layer  exposed M70.861 Other soft tissue disorders related to use, overuse and pressure, right lower leg Electronic Signature(s) Signed: 10/22/2016 4:16:11 PM By: Alric Quan Signed: 10/22/2016 4:34:04 PM By: Christin Fudge MD, FACS Previous Signature: 10/22/2016 11:50:19 AM Version By: Christin Fudge MD, FACS Entered By: Alric Quan on 10/22/2016 12:34:15

## 2016-10-29 ENCOUNTER — Encounter: Payer: Self-pay | Admitting: General Practice

## 2016-10-29 ENCOUNTER — Encounter: Payer: Medicare Other | Attending: Surgery | Admitting: Surgery

## 2016-10-29 ENCOUNTER — Telehealth: Payer: Self-pay | Admitting: General Practice

## 2016-10-29 DIAGNOSIS — Z85828 Personal history of other malignant neoplasm of skin: Secondary | ICD-10-CM | POA: Diagnosis not present

## 2016-10-29 DIAGNOSIS — Z87891 Personal history of nicotine dependence: Secondary | ICD-10-CM | POA: Insufficient documentation

## 2016-10-29 DIAGNOSIS — Z8673 Personal history of transient ischemic attack (TIA), and cerebral infarction without residual deficits: Secondary | ICD-10-CM | POA: Insufficient documentation

## 2016-10-29 DIAGNOSIS — Z888 Allergy status to other drugs, medicaments and biological substances status: Secondary | ICD-10-CM | POA: Diagnosis not present

## 2016-10-29 DIAGNOSIS — Z8249 Family history of ischemic heart disease and other diseases of the circulatory system: Secondary | ICD-10-CM | POA: Diagnosis not present

## 2016-10-29 DIAGNOSIS — D649 Anemia, unspecified: Secondary | ICD-10-CM | POA: Insufficient documentation

## 2016-10-29 DIAGNOSIS — I2510 Atherosclerotic heart disease of native coronary artery without angina pectoris: Secondary | ICD-10-CM | POA: Diagnosis not present

## 2016-10-29 DIAGNOSIS — I110 Hypertensive heart disease with heart failure: Secondary | ICD-10-CM | POA: Insufficient documentation

## 2016-10-29 DIAGNOSIS — E7800 Pure hypercholesterolemia, unspecified: Secondary | ICD-10-CM | POA: Diagnosis not present

## 2016-10-29 DIAGNOSIS — Z951 Presence of aortocoronary bypass graft: Secondary | ICD-10-CM | POA: Diagnosis not present

## 2016-10-29 DIAGNOSIS — Z833 Family history of diabetes mellitus: Secondary | ICD-10-CM | POA: Diagnosis not present

## 2016-10-29 DIAGNOSIS — J449 Chronic obstructive pulmonary disease, unspecified: Secondary | ICD-10-CM | POA: Insufficient documentation

## 2016-10-29 DIAGNOSIS — Z885 Allergy status to narcotic agent status: Secondary | ICD-10-CM | POA: Insufficient documentation

## 2016-10-29 DIAGNOSIS — L97812 Non-pressure chronic ulcer of other part of right lower leg with fat layer exposed: Secondary | ICD-10-CM | POA: Insufficient documentation

## 2016-10-29 DIAGNOSIS — M71061 Abscess of bursa, right knee: Secondary | ICD-10-CM | POA: Insufficient documentation

## 2016-10-29 DIAGNOSIS — Z882 Allergy status to sulfonamides status: Secondary | ICD-10-CM | POA: Diagnosis not present

## 2016-10-29 DIAGNOSIS — L89892 Pressure ulcer of other site, stage 2: Secondary | ICD-10-CM | POA: Diagnosis not present

## 2016-10-29 DIAGNOSIS — E11622 Type 2 diabetes mellitus with other skin ulcer: Secondary | ICD-10-CM | POA: Insufficient documentation

## 2016-10-29 DIAGNOSIS — M12561 Traumatic arthropathy, right knee: Secondary | ICD-10-CM | POA: Diagnosis not present

## 2016-10-29 DIAGNOSIS — K219 Gastro-esophageal reflux disease without esophagitis: Secondary | ICD-10-CM | POA: Insufficient documentation

## 2016-10-29 DIAGNOSIS — Z880 Allergy status to penicillin: Secondary | ICD-10-CM | POA: Insufficient documentation

## 2016-10-29 DIAGNOSIS — I509 Heart failure, unspecified: Secondary | ICD-10-CM | POA: Insufficient documentation

## 2016-10-29 NOTE — Telephone Encounter (Signed)
Unable to leave message for patient to call the office to reschedule his new patient appointment. I have mailed a letter for patient to call the office to schedule appointment. Please schedule if patient calls back.

## 2016-10-30 NOTE — Progress Notes (Signed)
[name], [name] ([id number]) Visit Report for 10/29/2016 Chief Complaint Document Details Patient Name: [name], [name]. Date of Service: 10/29/2016 9:45 AM Medical Record Number: [id number] Patient Account Number: [id number] Date of Birth/Sex: 09/18/1945 (71 y.o. Male) Treating RN: Ahmed Prima Primary Care Provider: [name] Dupre Other Clinician: Referring Provider: [name] Dupre Treating Provider/Extender: Frann Rider in Treatment: 10 Information Obtained from: Patient Chief Complaint Patient is at the clinic for treatment of an open pressure ulcer to the area of the right knee for about 3 weeks now Electronic Signature(s) Signed: 10/29/2016 9:56:58 AM By: Christin Fudge MD, FACS Entered By: Christin Fudge on 10/29/2016 09:56:57 [place], [name] [name] ([id number]) -------------------------------------------------------------------------------- HPI Details Patient Name: [name] Gathers T. Date of Service: 10/29/2016 9:45 AM Medical Record Number: [id number] Patient Account Number: [id number] Date of Birth/Sex: [birth year] (71 y.o. Male) Treating RN: Ahmed Prima Primary Care Provider: [name] Dupre Other Clinician: Referring Provider: [name] Dupre Treating Provider/Extender: Frann Rider in Treatment: 10 History of Present Illness Location: right knee swelling with skin ulceration Quality: Patient reports experiencing a dull pain to affected area(s). Severity: Patient states wound are getting worse. Duration: Patient has had the wound for <4 weeks prior to presenting for treatment Timing: Pain in wound is Intermittent (comes and goes Context: The wound occurred when the patient had a cast placed over his right lower extremity and with the cast was removed he had a swelling of the knee with problems with his skin Modifying Factors: Other treatment(s) tried include:orthopedics Associated Signs and Symptoms: swelling of both lower extremities with large  amount of swelling of the knee HPI Description: 71 year old gentleman who has been living in an independent living facility for long-term care has had recurrent falls and recently had a large hematoma on the right knee. Known to be on aspirin and Plavix for history of CABG and was seen in the ER at the end of February after a fall. past medical history of arthritis, congestive heart failure, diabetes mellitus, hypertension, status post CABG o2, status post cholecystectomy, hernia repair and kidney surgery. recent workup for the fall showed negative clinical findings in his extremities and it was thought that he was falling due to deconditioning. a x-ray showed a right fibular neck fracture and was placed in a splint. he was asked to be seen by orthopedic surgeon. the patient cannot tell me which orthopedic doctor had seen him and I'm not able to get any notes from [place], regarding his orthopedic care. The patient says twice during this last week he has had bloody fluid shooting out of his knee and has drained all over his boot and the flow. 08/21/2016 -- he was seen by the nurse practitioner at the facility and and distend she aspirated about 60 mL of hemorrhagic fluid from his right knee. 08/31/2016 -- since I saw the patient last he was admitted to the hospital at [facility] between 08/23/2016 2 08/27/2016 and was treated with a pulmonary embolism. he had EKOS complete, was started on Xarelto, and also treated for chronic diastolic congestive heart failure. he was asked to follow-up with the wound center and continue management locally. 09/07/2016 -- he has an appointment to see his orthopedic surgeon later this week. 09/14/2016 -- he did see the orthopedic service and they have asked him to continue with physical therapy. They discontinued the boot and the x-ray done showed a healed fracture. They said it was okay to use a wound VAC if needed. Addendum -- we got the note from the  Orthopedic  office where he was seen by [facility] orthopedics on [facility] in [place] and was seen by the PA [name]. -- The x-ray which was done was reviewed and there is good healing of the fracture and from the orthopedic point of view he has healed well and they have asked him to continue with good control of his diabetes and management by the wound center including using a wound VAC if required. [name] ([id number]) 09/21/2016 -- the patient's wound VAC had been applied 2 days ago, but the black sponge had not been placed laterally under the skin flap and into the area of the right of the knee, where there was a large cavity. On examination, after removal of his wound VAC, there is a large collection of seropurulent fluid and hematoma in the area starting at the lateral knee and going down to his right lateral compartment of the leg. Clinically, this is a huge infected abscess cavity 10/01/2016 -- patient was admitted between 09/21/2016 and 09/23/2016 with cellulitis of the right lower extremity with a large infected hematoma which was drained at the bedside by surgical services. he was treated with Levaquin and continues to be on Xarelto. the patient is back on a wound VAC which has not been functioning due to the large depth of the hematoma and organized clots. He is still on Keflex orally. Addendum : I spoke to his surgeon [name] and we discussed options for further draining this large hematoma which I suspect will get infected sooner than later. I expressed the fact that the wound VAC does not seem to be functioning well and competent enough to drain this large cavity. He will call for him in the office. [id number] -- the patient has hypotension in the sitting position and is a bit lethargic as compared to his normal self. I spoke personally to his nurse at the nursing homes, [name] Judson Roch and discussed his management including the fact that he had to have a surgical  consultation for the right lower leg, with [name]. Prior to the patient's discharge from the wound center he continues to have hypotension and hence I'm going to talk to the ER physician and have him evaluated in the ER. 10/15/2016 -- the patient was admitted to the hospital between 10/08/2016 and 10/12/2016. He was treated for hypokalemia, hyponatremia, renal insufficiency, sepsis and possible abscess of the right lower leg. he was seen by surgical services and the fluid collection was drained with a Yankauer suction from the knee wound and the opinion of the surgeons was that he would not need any surgical intervention and continue with local wound care. I had personally communicated with [name] regarding this. the patient is still on Xarelto. During the admission he was also seen by Dr. Ola Spurr for infectious disease consult and he recommended continue vancomycin for MRSA and change meropenem to ceftriaxone for this Proteus. He recommended a four-week course of antibiotic therapy and follow-up back with Dr. Ola Spurr in 4 weeks. before his discharge since there was clinical improvement Dr. Ola Spurr converted the treatment to oral clindamycin and oral Keflex for a four-week course of antibiotic therapy. He was to be monitored for C. difficile. 10/29/2016 -- he missed his appointment with Dr. Rosana Hoes at the surgical office. He continues to be on oral antibiotics as per Dr. Ola Spurr. Electronic Signature(s) Signed: 10/29/2016 9:58:25 AM By: Christin Fudge MD, FACS Previous Signature: 10/29/2016 9:57:06 AM Version By: Christin Fudge MD,  FACS Entered By: Christin Fudge on 10/29/2016 09:58:25 Williamson, [name] [name] ([id number]) -------------------------------------------------------------------------------- Physical Exam Details Patient Name: [name], [name] Date of Service: 10/29/2016 9:45 AM Medical Record Number: [id number] Patient Account Number: [id number] Date of Birth/Sex:  1946-01-05 (71 y.o. Male) Treating RN: Ahmed Prima Primary Care Provider: [name] Dupre Other Clinician: Referring Provider: [name] Dupre Treating Provider/Extender: Frann Rider in Treatment: 10 Constitutional . Pulse regular. Respirations normal and unlabored. Afebrile. . Eyes Nonicteric. Reactive to light. Ears, Nose, Mouth, and Throat Lips, teeth, and gums WNL.[name] Moist mucosa without lesions. Neck supple and nontender. No palpable supraclavicular or cervical adenopathy. Normal sized without goiter. Respiratory WNL. No retractions.. Breath sounds WNL, No rubs, rales, rhonchi, or wheeze.. Cardiovascular Heart rhythm and rate regular, no murmur or gallop.. Pedal Pulses WNL. No clubbing, cyanosis or edema. Chest Breasts symmetical and no nipple discharge.. Breast tissue WNL, no masses, lumps, or tenderness.. Lymphatic No adneopathy. No adenopathy. No adenopathy. Musculoskeletal Adexa without tenderness or enlargement.. Digits and nails w/o clubbing, cyanosis, infection, petechiae, ischemia, or inflammatory conditions.. Integumentary (Hair, Skin) No suspicious lesions. No crepitus or fluctuance. No peri-wound warmth or erythema. No masses.[name] Psychiatric Judgement and insight Intact.. No evidence of depression, anxiety, or agitation.. Notes the wound on the right knee has sealed off from the lower hematoma and undermines for about 3 cm. The right lateral compartment has organized into a hematoma now and there is no evidence of cellulitis or abscess Electronic Signature(s) Signed: 10/29/2016 9:59:15 AM By: Christin Fudge MD, FACS Entered By: Christin Fudge on 10/29/2016 09:59:15 Muss[place], [name] [name] ([id number]) -------------------------------------------------------------------------------- Physician Orders Details Patient Name: [name] Gathers T. Date of Service: 10/29/2016 9:45 AM Medical Record Number: [id number] Patient Account Number: [id number] Date of Birth/Sex:  [birth year] (71 y.o. Male) Treating RN: Ahmed Prima Primary Care Provider: [name] Dupre Other Clinician: Referring Provider: [name] Dupre Treating Provider/Extender: Frann Rider in Treatment: 10 Verbal / Phone Orders: Yes Clinician: Carolyne Fiscal, Debi Read Back and Verified: Yes Diagnosis Coding Wound Cleansing Wound #1 Right,Anterior Knee o Clean wound with Normal Saline. o Cleanse wound with mild soap and water Anesthetic Wound #1 Right,Anterior Knee o Topical Lidocaine 4% cream applied to wound bed prior to debridement - Only in the Wound Clinic Primary Wound Dressing Wound #1 Right,Anterior Knee o Aquacel Ag - silver alginate rope pack lightly in wound Secondary Dressing Wound #1 Right,Anterior Knee o ABD pad o Dry Gauze o Conform/Kerlix o Other - tape Dressing Change Frequency Wound #1 Right,Anterior Knee o Change dressing every day. o Other: - as needed Follow-up Appointments Wound #1 Right,Anterior Knee o Return Appointment in 1 week. Edema Control Wound #1 Right,Anterior Knee o Elevate legs to the level of the heart and pump ankles as often as possible [name], [name] ([id number]) Additional Orders / Instructions Wound #1 Right,Anterior Knee o Increase protein intake. o Activity as tolerated Medications-please add to medication list. Wound #1 Right,Anterior Knee o Other: - Vitamin C, Zinc, MVI Electronic Signature(s) Signed: 10/29/2016 3:31:51 PM By: Christin Fudge MD, FACS Signed: 10/29/2016 4:11:28 PM By: Alric Quan Entered By: Alric Quan on 10/29/2016 10:13:02 Meissner, [name] [name] ([id number]) -------------------------------------------------------------------------------- Problem List Details Patient Name: [name], [name] Date of Service: 10/29/2016 9:45 AM Medical Record Number: [id number] Patient Account Number: [id number] Date of Birth/Sex: [birth year] (71 y.o. 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, right knee 08/14/2016 Yes L97.812 Non-pressure chronic ulcer of other part of right lower leg 08/14/2016 Yes with fat layer exposed L89.892 Pressure ulcer of other site, stage 2 08/14/2016 Yes M70.861 Other soft tissue disorders related to use, overuse and 08/14/2016 Yes pressure, right lower leg M71.061 Abscess of bursa, right knee 09/21/2016 Yes Inactive Problems Resolved Problems Electronic Signature(s) Signed: 10/29/2016 9:56:43 AM By: Christin Fudge MD, FACS Entered By: Christin Fudge on 10/29/2016 09:56:42 Trotti, [name] [name] ([id number]) -------------------------------------------------------------------------------- Progress Note Details Patient Name: [name] Gathers T. Date of Service: 10/29/2016 9:45 AM Medical Record Number: [id number] Patient Account Number: [id number] Date of Birth/Sex: 1946-01-25 (71 y.o. Male) Treating RN: Ahmed Prima Primary Care Provider: [name] Dupre Other Clinician: Referring Provider: [name] Dupre Treating Provider/Extender: Frann Rider in Treatment: 10 Subjective Chief Complaint Information obtained from Patient Patient is at the clinic for treatment of an open pressure ulcer to the area of the right knee for about 3 weeks now History of Present Illness (HPI) The following HPI elements were documented for the patient's wound: Location: right knee swelling with skin ulceration Quality: Patient reports experiencing a dull pain to affected area(s). Severity: Patient states wound are getting worse. Duration: Patient has had the wound for Timing: Pain in wound is Intermittent (comes and goes Context:  The wound occurred when the patient had a cast placed over his right lower extremity and with the cast was removed he had a swelling of the knee with problems with his skin Modifying Factors: Other treatment(s) tried include:orthopedics Associated Signs and Symptoms: swelling of both lower extremities with large amount of swelling of the knee 71 year old gentleman who has been living in an independent living facility for long-term care has had recurrent falls and recently had a large hematoma on the right knee. Known to be on aspirin and Plavix for history of CABG and was seen in the ER at the end of February after a fall. past medical history of arthritis, congestive heart failure, diabetes mellitus, hypertension, status post CABG o2, status post cholecystectomy, hernia repair and kidney surgery. recent workup for the fall showed negative clinical findings in his extremities and it was thought that he was falling due to deconditioning. a x-ray showed a right fibular neck fracture and was placed in a splint. he was asked to be seen by orthopedic surgeon. the patient cannot tell me which orthopedic doctor had seen him and I'm not able to get any notes from [place], regarding his orthopedic care. The patient says twice during this last week he has had bloody fluid shooting out of his knee and has drained all over his boot and the flow. 08/21/2016 -- he was seen by the nurse practitioner at the facility and and distend she aspirated about 60 mL of hemorrhagic fluid from his right knee. 08/31/2016 -- since I saw the patient last he was admitted to the hospital at [facility] between 08/23/2016 2 08/27/2016 and was treated with a pulmonary embolism. he had EKOS complete, was started on Xarelto, and also treated for chronic diastolic congestive heart failure. he was asked to follow-up with the wound center and continue management locally. 09/07/2016 -- he has an appointment to see his orthopedic  surgeon later this week. 09/14/2016 -- he did see the orthopedic service and they have asked him to continue with physical therapy. [name], [name] ([id number]) They discontinued the boot and the x-ray done showed a healed fracture. They said  it was okay to use a wound VAC if needed. Addendum -- we got the note from the Orthopedic office where he was seen by [facility] orthopedics on [facility] in [place] and was seen by the PA [name]. -- The x-ray which was done was reviewed and there is good healing of the fracture and from the orthopedic point of view he has healed well and they have asked him to continue with good control of his diabetes and management by the wound center including using a wound VAC if required. 09/21/2016 -- the patient's wound VAC had been applied 2 days ago, but the black sponge had not been placed laterally under the skin flap and into the area of the right of the knee, where there was a large cavity. On examination, after removal of his wound VAC, there is a large collection of seropurulent fluid and hematoma in the area starting at the lateral knee and going down to his right lateral compartment of the leg. Clinically, this is a huge infected abscess cavity 10/01/2016 -- patient was admitted between 09/21/2016 and 09/23/2016 with cellulitis of the right lower extremity with a large infected hematoma which was drained at the bedside by surgical services. he was treated with Levaquin and continues to be on Xarelto. the patient is back on a wound VAC which has not been functioning due to the large depth of the hematoma and organized clots. He is still on Keflex orally. Addendum : I spoke to his surgeon [name] and we discussed options for further draining this large hematoma which I suspect will get infected sooner than later. I expressed the fact that the wound VAC does not seem to be functioning well and competent enough to drain this large  cavity. He will call for him in the office. [id number] 2018 -- the patient has hypotension in the sitting position and is a bit lethargic as compared to his normal self. I spoke personally to his nurse at the nursing homes, [name] Judson Roch and discussed his management including the fact that he had to have a surgical consultation for the right lower leg, with [name]. Prior to the patient's discharge from the wound center he continues to have hypotension and hence I'm going to talk to the ER physician and have him evaluated in the ER. 10/15/2016 -- the patient was admitted to the hospital between 10/08/2016 and 10/12/2016. He was treated for hypokalemia, hyponatremia, renal insufficiency, sepsis and possible abscess of the right lower leg. he was seen by surgical services and the fluid collection was drained with a Yankauer suction from the knee wound and the opinion of the surgeons was that he would not need any surgical intervention and continue with local wound care. I had personally communicated with [name] regarding this. the patient is still on Xarelto. During the admission he was also seen by Dr. Ola Spurr for infectious disease consult and he recommended continue vancomycin for MRSA and change meropenem to ceftriaxone for this Proteus. He recommended a four-week course of antibiotic therapy and follow-up back with Dr. Ola Spurr in 4 weeks. before his discharge since there was clinical improvement Dr. Ola Spurr converted the treatment to oral clindamycin and oral Keflex for a four-week course of antibiotic therapy. He was to be monitored for C. difficile. 10/29/2016 -- he missed his appointment with Dr. Rosana Hoes at the surgical office. He continues to be on oral antibiotics as per Dr. Ola Spurr. Objective Yakubov, Lillian T. ([id number]) Constitutional Pulse regular.  Respirations normal and unlabored. Afebrile. Vitals Time Taken: 9:32 AM, Height: 67 in, Weight: 357 lbs, BMI: 55.9,  Temperature: 98.3 F, Pulse: 68 bpm, Respiratory Rate: 16 breaths/min, Blood Pressure: 119/67 mmHg. Eyes Nonicteric. Reactive to light. Ears, Nose, Mouth, and Throat Lips, teeth, and gums WNL.[name] Moist mucosa without lesions. Neck supple and nontender. No palpable supraclavicular or cervical adenopathy. Normal sized without goiter. Respiratory WNL. No retractions.. Breath sounds WNL, No rubs, rales, rhonchi, or wheeze.. Cardiovascular Heart rhythm and rate regular, no murmur or gallop.. Pedal Pulses WNL. No clubbing, cyanosis or edema. Chest Breasts symmetical and no nipple discharge.. Breast tissue WNL, no masses, lumps, or tenderness.. Lymphatic No adneopathy. No adenopathy. No adenopathy. Musculoskeletal Adexa without tenderness or enlargement.. Digits and nails w/o clubbing, cyanosis, infection, petechiae, ischemia, or inflammatory conditions.[name] Psychiatric Judgement and insight Intact.. No evidence of depression, anxiety, or agitation.. General Notes: the wound on the right knee has sealed off from the lower hematoma and undermines for about 3 cm. The right lateral compartment has organized into a hematoma now and there is no evidence of cellulitis or abscess Integumentary (Hair, Skin) No suspicious lesions. No crepitus or fluctuance. No peri-wound warmth or erythema. No masses.. Wound #1 status is Open. Original cause of wound was Pressure Injury. The wound is located on the Right,Anterior Knee. The wound measures 0.8cm length x 2.4cm width x 0.4cm depth; 1.508cm^2 area and 0.603cm^3 volume. There is Fat Layer (Subcutaneous Tissue) Exposed exposed. There is no tunneling noted, however, there is undermining starting at 4:00 and ending at 9:00 with a maximum distance of 3cm. There is a large amount of serosanguineous drainage noted. The wound margin is flat and intact. There is large (67-100%) red granulation within the wound bed. There is a small (1-33%) amount of necrotic  tissue Stoy, Elijha T. ([id number]) within the wound bed including Adherent Slough. The periwound skin appearance exhibited: Erythema. The periwound skin appearance did not exhibit: Callus, Crepitus, Excoriation, Induration, Rash, Scarring, Dry/Scaly, Maceration, Atrophie Blanche, Cyanosis, Ecchymosis, Hemosiderin Staining, Mottled, Pallor, Rubor. The surrounding wound skin color is noted with erythema which is circumferential. Periwound temperature was noted as No Abnormality. The periwound has tenderness on palpation. 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 Cleanse wound with mild soap and water Anesthetic: Wound #1 Right,Anterior Knee: Topical Lidocaine 4% cream applied to wound bed prior to debridement - Only in the Wound Clinic Primary Wound Dressing: Wound #1 Right,Anterior Knee: Aquacel Ag - silver alginate rope pack lightly in wound Secondary Dressing: Wound #1 Right,Anterior Knee: ABD pad Dry Gauze Conform/Kerlix Other - tape Dressing Change Frequency: Wound #1 Right,Anterior Knee: Change dressing every day. Other: - as needed Follow-up Appointments: Wound #1 Right,Anterior Knee: Ditmer, Ladd T. ([id number]) Return Appointment in 1 week. Edema Control: Wound #1 Right,Anterior Knee: Elevate legs to the level of the heart and pump ankles as often as possible Additional Orders / Instructions: Wound #1 Right,Anterior Knee: Increase protein intake. Activity as tolerated Medications-please add to medication list.: Wound  #1 Right,Anterior Knee: Other: - Vitamin C, Zinc, MVI The patient has improved over the  last 2 weeks and the organized hematoma does not seem to be infected in his right lateral compartment. After careful examination of the patient's right lower extremity, I have recommended: 1. Packing the wound at the knee with Silver alginate rope to be changed daily and when necessary. 2. Continue with oral antibiotics as per Dr. Ola Spurr. 3. Review in the wound center once an appropriate surgical consultation has taken place. Electronic Signature(s) Signed: 10/29/2016 12:53:23 PM By: Christin Fudge MD, FACS Previous Signature: 10/29/2016 10:00:23 AM Version By: Christin Fudge MD, FACS Entered By: Christin Fudge on 10/29/2016 12:53:23 Husband, [name] [name] ([id number]) -------------------------------------------------------------------------------- SuperBill Details Patient Name: [name] Gathers T. Date of Service: 10/29/2016 Medical Record Number: [id number] Patient Account Number: [id number] Date of Birth/Sex: 06/13/1945 (71 y.o. 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 L89.892 Pressure ulcer of other site, stage 2 M70.861 Other soft tissue disorders related to use, overuse and pressure, right lower leg M71.061 Abscess of bursa, right knee Facility Procedures CPT4 Code: 35597416 Description: 99213 - WOUND CARE VISIT-LEV 3 EST PT Modifier: Quantity: 1 Physician Procedures CPT4: Description Modifier Quantity Code 3845364 68032 - WC PHYS LEVEL 3 - EST PT 1 ICD-10 Description Diagnosis E11.622 Type 2 diabetes mellitus with  other skin ulcer 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

## 2016-10-30 NOTE — Progress Notes (Signed)
GHAZI, RUMPF ([id number]) Visit Report for 10/29/2016 Arrival Information Details Patient Name: [name], [name]. Date of Service: 10/29/2016 9:45 AM Medical Record Number: [id number] Patient Account Number: [id number] Date of Birth/Sex: 04-18-1946 (71 y.o. Male) Treating RN: [name] Primary Care [name]: [name] Other Clinician: Referring [name]: [name] Treating Annia Gomm/Extender: [name] in Treatment: 10 Visit Information History Since Last Visit All ordered tests and consults were completed: No Patient Arrived: Wheel Chair Added or deleted any medications: No Arrival Time: 09:28 Any new allergies or adverse reactions: No Accompanied By: self Had a fall or experienced change in No Transfer Assistance: [employer] activities of daily living that may affect Patient Lift risk of falls: Patient Identification Verified: Yes Signs or symptoms of abuse/neglect since last No Secondary Verification Process Yes visito Completed: Hospitalized since last visit: No Patient Requires Transmission- No Has Dressing in Place as Prescribed: Yes Based Precautions: Pain Present Now: No Patient Has Alerts: No Electronic Signature(s) Signed: 10/29/2016 4:11:28 PM By: [name] Entered By: [name] on 10/29/2016 09:31:57 [name], [name] ([id number]) -------------------------------------------------------------------------------- Clinic Level of Care Assessment Details Patient Name: [name]. Date of Service: 10/29/2016 9:45 AM Medical Record Number: [id number] Patient Account Number: [id number] Date of Birth/Sex: [birth year] (71 y.o. Male) Treating RN: [name] Primary Care [place] Maiorino: [name] Other Clinician: Referring Lyndzie Zentz: [name] Treating Saima Monterroso/Extender: [name] in Treatment: 10 Clinic Level of Care Assessment Items TOOL 4 Quantity Score X - Use when only an EandM is performed on  FOLLOW-UP visit 1 0 ASSESSMENTS - Nursing Assessment / Reassessment X - Reassessment of Co-morbidities (includes updates in patient status) 1 10 X - Reassessment of Adherence to Treatment Plan 1 5 ASSESSMENTS - Wound and Skin Assessment / Reassessment X - Simple Wound Assessment / Reassessment - one wound 1 5 []  - Complex Wound Assessment / Reassessment - multiple wounds 0 []  - Dermatologic / Skin Assessment (not related to wound area) 0 ASSESSMENTS - Focused Assessment []  - Circumferential Edema Measurements - multi extremities 0 []  - Nutritional Assessment / Counseling / Intervention 0 []  - Lower Extremity Assessment (monofilament, tuning fork, pulses) 0 []  - Peripheral Arterial Disease Assessment (using hand held doppler) 0 ASSESSMENTS - Ostomy and/or Continence Assessment and Care []  - Incontinence Assessment and Management 0 []  - Ostomy Care Assessment and Management (repouching, etc.) 0 PROCESS - Coordination of Care []  - Simple Patient / Family Education for ongoing care 0 X - Complex (extensive) Patient / Family Education for ongoing care 1 20 X - Staff obtains Programmer, systems, Records, Test Results / Process Orders 1 10 X - Staff telephones HHA, Nursing Homes / Clarify orders / etc 1 10 []  - Routine Transfer to another Facility (non-emergent condition) 0 [name], [name]. ([id number]) []  - Routine Hospital Admission (non-emergent condition) 0 []  - New Admissions / Biomedical engineer / Ordering NPWT, Apligraf, etc. 0 []  - Emergency Hospital Admission (emergent condition) 0 X - Simple Discharge Coordination 1 10 []  - Complex (extensive) Discharge Coordination 0 PROCESS - Special Needs []  - Pediatric / Minor Patient Management 0 []  - Isolation Patient Management 0 []  - Hearing / Language / Visual special needs 0 []  - Assessment of Community assistance (transportation, D/C planning, etc.) 0 []  - Additional assistance / Altered mentation 0 []  - Support Surface(s) Assessment (bed,  cushion, seat, etc.) 0 INTERVENTIONS - Wound Cleansing / Measurement X - Simple Wound Cleansing - one wound 1 5 []  - Complex Wound Cleansing - multiple wounds 0  X - Wound Imaging (photographs - any number of wounds) 1 5 []  - Wound Tracing (instead of photographs) 0 X - Simple Wound Measurement - one wound 1 5 []  - Complex Wound Measurement - multiple wounds 0 INTERVENTIONS - Wound Dressings []  - Small Wound Dressing one or multiple wounds 0 X - Medium Wound Dressing one or multiple wounds 1 15 []  - Large Wound Dressing one or multiple wounds 0 X - Application of Medications - topical 1 5 []  - Application of Medications - injection 0 INTERVENTIONS - Miscellaneous []  - External ear exam 0 [name], [name]. ([id number]) []  - Specimen Collection (cultures, biopsies, blood, body fluids, etc.) 0 []  - Specimen(s) / Culture(s) sent or taken to Lab for analysis 0 []  - Patient Transfer (multiple staff / Harrel Lemon Lift / Similar devices) 0 []  - Simple Staple / Suture removal (25 or less) 0 []  - Complex Staple / Suture removal (26 or more) 0 []  - Hypo / Hyperglycemic Management (close monitor of Blood Glucose) 0 []  - Ankle / Brachial Index (ABI) - do not check if billed separately 0 X - Vital Signs 1 5 Has the patient been seen at the hospital within the last three years: Yes Total Score: 110 Level Of Care: New/Established - Level 3 Electronic Signature(s) Signed: 10/29/2016 4:11:28 PM By: [name] Entered By: [name] on 10/29/2016 10:17:10 [name], [name] ([id number]) -------------------------------------------------------------------------------- Encounter Discharge Information Details Patient Name: [name]. Date of Service: 10/29/2016 9:45 AM Medical Record Number: [id number] Patient Account Number: [id number] Date of Birth/Sex: [birth year] (71 y.o. Male) Treating RN: [name] Primary Care Cresencio Reesor: [name] Other Clinician: Referring Joud Ingwersen:  [name] Treating Madelein Mahadeo/Extender: [name] in Treatment: 10 Encounter Discharge Information Items Discharge Pain Level: 0 Discharge Condition: Stable Ambulatory Status: Wheelchair Discharge Destination: Nursing Home Transportation: Other Accompanied By: self Schedule Follow-up Appointment: Yes Medication Reconciliation completed No and provided to Patient/Care [place]: Provided on Clinical Summary of Care: 10/29/2016 Form Type Recipient Paper Patient JE Electronic Signature(s) Signed: 10/29/2016 10:05:21 AM By: Sharon Mt Entered By: Sharon Mt on 10/29/2016 10:05:20 [name], [name] ([id number]) -------------------------------------------------------------------------------- Lower Extremity Assessment Details Patient Name: [name]. Date of Service: 10/29/2016 9:45 AM Medical Record Number: [id number] Patient Account Number: [id number] Date of Birth/Sex: 25-Mar-1946 (71 y.o. Male) Treating RN: [name] Primary Care Maelynn Moroney: [name] Other Clinician: Referring Dimple Bastyr: [name] Treating Mayari Matus/Extender: [name] in Treatment: 10 Vascular Assessment Pulses: Dorsalis Pedis Palpable: [Right:Yes] Posterior Tibial Extremity colors, hair growth, and conditions: Extremity Color: [Right:Hyperpigmented] Temperature of Extremity: [Right:Warm] Capillary Refill: [Right:< 3 seconds] Electronic Signature(s) Signed: 10/29/2016 4:11:28 PM By: [name] Entered By: [name] on 10/29/2016 09:42:54 Kangas, [name] ([id number]) -------------------------------------------------------------------------------- Multi Wound Chart Details Patient Name: [name]. Date of Service: 10/29/2016 9:45 AM Medical Record Number: [id number] Patient Account Number: [id number] Date of Birth/Sex: 29-Oct-1945 (71 y.o. Male) Treating RN: [name] Primary Care Shea Swalley: [name] Other Clinician: Referring  Randal Yepiz: [name] Treating Mahki Spikes/Extender: [name] in Treatment: 10 Vital Signs Height(in): 67 Pulse(bpm): 68 Weight(lbs): 357 Blood Pressure 119/67 (mmHg): Body Mass Index(BMI): 56 Temperature(F): 98.3 Respiratory Rate 16 (breaths/min): Photos: [1:No Photos] [N/A:N/A] Wound Location: [1:Right Knee - Anterior] [N/A:N/A] Wounding Event: [1:Pressure Injury] [N/A:N/A] Primary Etiology: [1:Diabetic Wound/Ulcer of the Lower Extremity] [N/A:N/A] Comorbid History: [1:Anemia, Asthma, Arrhythmia, Congestive Heart Failure, Coronary Artery Disease, Hypertension, Type II Diabetes, Osteoarthritis] [N/A:N/A] Date Acquired: [1:07/23/2016] [N/A:N/A] Weeks of Treatment: [1:10] [N/A:N/A] Wound Status: [1:Open] [N/A:N/A] Measurements L x  W x D 0.8x2.4x0.4 [N/A:N/A] (cm) Area (cm) : [1:1.508] [N/A:N/A] Volume (cm) : [1:0.603] [N/A:N/A] % Reduction in Area: [1:80.80%] [N/A:N/A] % Reduction in Volume: 23.20% [N/A:N/A] Starting Position 1 4 (o'clock): Ending Position 1 [1:9] (o'clock): Maximum Distance 1 3 (cm): Undermining: [1:Yes] [N/A:N/A] Classification: [1:Grade 1] [N/A:N/A] Exudate Amount: [1:Large] [N/A:N/A] Exudate Type: [1:Serosanguineous] [N/A:N/A] Exudate Color: red, brown N/A N/A Wound Margin: Flat and Intact N/A N/A Granulation Amount: Large (67-100%) N/A N/A Granulation Quality: Red N/A N/A Necrotic Amount: Small (1-33%) N/A N/A Exposed Structures: Fat Layer (Subcutaneous N/A N/A Tissue) Exposed: Yes Fascia: No Tendon: No Muscle: No Joint: No Bone: No Epithelialization: None N/A N/A Periwound Skin Texture: Excoriation: No N/A N/A Induration: No Callus: No Crepitus: No Rash: No Scarring: No Periwound Skin Maceration: No N/A N/A Moisture: Dry/Scaly: No Periwound Skin Color: Erythema: Yes N/A N/A Atrophie Blanche: No Cyanosis: No Ecchymosis: No Hemosiderin Staining: No Mottled: No Pallor: No Rubor: No Erythema Location:  Circumferential N/A N/A Temperature: No Abnormality N/A N/A Tenderness on Yes N/A N/A Palpation: Wound Preparation: Ulcer Cleansing: N/A N/A Rinsed/Irrigated with Saline Topical Anesthetic Applied: Other: Lidocaine 4% Treatment Notes Wound #1 (Right, Anterior Knee) 1. Cleansed with: Clean wound with Normal Saline 2. Anesthetic Topical Lidocaine 4% cream to wound bed prior to debridement 4. Dressing Applied: Aquacel Ag [name], [name]. ([id number]) 5. Secondary Dressing Applied ABD Pad Dry Gauze Kerlix/Conform 7. Secured with Recruitment consultant) Signed: 10/29/2016 9:56:50 AM By: Christin Fudge MD, FACS Entered By: Christin Fudge on 10/29/2016 09:56:50 [place], [name] ([id number]) -------------------------------------------------------------------------------- [place] Details Patient Name: [name]. Date of Service: 10/29/2016 9:45 AM Medical Record Number: [id number] Patient Account Number: [id number] Date of Birth/Sex: 18-Oct-1945 (71 y.o. Male) Treating RN: Carolyne Fiscal, Debi Primary Care Faysal Fenoglio: [name] Other Clinician: Referring Szymon Foiles: [name] Treating Lia Vigilante/Extender: [name] in Treatment: 10 Active Inactive ` Orientation to the Wound Care Program Nursing Diagnoses: Knowledge deficit related to the wound healing center program Goals: Patient/caregiver will verbalize understanding of the [place] Program Date Initiated: 08/14/2016 Target Resolution Date: 12/14/2016 Goal Status: Active Interventions: Provide education on orientation to the wound center Notes: ` Pressure Nursing Diagnoses: Knowledge deficit related to causes and risk factors for pressure ulcer development Knowledge deficit related to management of pressures ulcers Potential for impaired tissue integrity related to pressure, friction, moisture, and shear Goals: Patient will remain free from development of  additional pressure ulcers Date Initiated: 08/14/2016 Target Resolution Date: 12/14/2016 Goal Status: Active Patient will remain free of pressure ulcers Date Initiated: 08/14/2016 Target Resolution Date: 12/14/2016 Goal Status: Active Patient/caregiver will verbalize risk factors for pressure ulcer development Date Initiated: 08/14/2016 Target Resolution Date: 12/14/2016 Goal Status: Active Patient/caregiver will verbalize understanding of pressure ulcer management Date Initiated: 08/14/2016 Target Resolution Date: 12/14/2016 Goal Status: Active [name], [name] ([id number]) Interventions: Assess: immobility, friction, shearing, incontinence upon admission and as needed Assess offloading mechanisms upon admission and as needed Assess potential for pressure ulcer upon admission and as needed Provide education on pressure ulcers Treatment Activities: Patient referred for seating evaluation to ensure proper offloading : 08/14/2016 Pressure reduction/relief device ordered : 08/14/2016 Notes: ` Wound/Skin Impairment Nursing Diagnoses: Impaired tissue integrity Knowledge deficit related to ulceration/compromised skin integrity Goals: Patient/caregiver will verbalize understanding of skin care regimen Date Initiated: 08/14/2016 Target Resolution Date: 12/14/2016 Goal Status: Active Ulcer/skin breakdown will have a volume reduction of 30% by week 4 Date Initiated: 08/14/2016 Target Resolution Date: 12/14/2016 Goal Status: Active Ulcer/skin breakdown will have a  volume reduction of 50% by week 8 Date Initiated: 08/14/2016 Target Resolution Date: 12/14/2016 Goal Status: Active Ulcer/skin breakdown will have a volume reduction of 80% by week 12 Date Initiated: 08/14/2016 Target Resolution Date: 12/14/2016 Goal Status: Active Ulcer/skin breakdown will heal within 14 weeks Date Initiated: 08/14/2016 Target Resolution Date: 12/14/2016 Goal Status: Active Interventions: Assess patient/caregiver  ability to obtain necessary supplies Assess patient/caregiver ability to perform ulcer/skin care regimen upon admission and as needed Assess ulceration(s) every visit Provide education on ulcer and skin care Treatment Activities: Referred to DME Khristen Cheyney for dressing supplies : 08/14/2016 Skin care regimen initiated : 08/14/2016 [name], [name] ([id number]) Topical wound management initiated : 08/14/2016 Notes: Electronic Signature(s) Signed: 10/29/2016 4:11:28 PM By: [name] Entered By: [name] on 10/29/2016 09:42:59 [name], [name] ([id number]) -------------------------------------------------------------------------------- Pain Assessment Details Patient Name: [name]. Date of Service: 10/29/2016 9:45 AM Medical Record Number: [id number] Patient Account Number: [id number] Date of Birth/Sex: [birth year] (71 y.o. Male) Treating RN: [name] Primary Care Niva Murren: [name] Other Clinician: Referring Kree Armato: [name] Treating Odessie Polzin/Extender: [name] in Treatment: 10 Active Problems Location of Pain Severity and Description of Pain Patient Has Paino No Site Locations With Dressing Change: No Pain Management and Medication Current Pain Management: Electronic Signature(s) Signed: 10/29/2016 4:11:28 PM By: [name] Entered By: [name] on 10/29/2016 09:32:02 [name], [name] ([id number]) -------------------------------------------------------------------------------- Patient/Caregiver Education Details Patient Name: [name]. Date of Service: 10/29/2016 9:45 AM Medical Record Number: [id number] Patient Account Number: [id number] Date of Birth/Gender: 21-Jun-1945 (71 y.o. Male) Treating RN: [name] Primary Care Physician: [name] Other Clinician: Referring Physician: [name] [name] Treating Physician/Extender: [name] in Treatment: 10 Education  Assessment Education Provided To: Patient Education Topics Provided Wound/Skin Impairment: Handouts: Other: change dressing as ordered Methods: Demonstration, Explain/Verbal Responses: State content correctly Electronic Signature(s) Signed: 10/29/2016 4:11:28 PM By: [name] Entered By: [name] on 10/29/2016 09:43:39 [name], [name] ([id number]) -------------------------------------------------------------------------------- Wound Assessment Details Patient Name: [name]. Date of Service: 10/29/2016 9:45 AM Medical Record Number: [id number] Patient Account Number: [id number] Date of Birth/Sex: 11/25/1945 (71 y.o. Male) Treating RN: [name] Primary Care Malisa Ruggiero: [name] Other Clinician: Referring Arville Postlewaite: [name] Treating Blade Scheff/Extender: [name] in Treatment: 10 Wound Status Wound Number: 1 Primary Diabetic Wound/Ulcer of the Lower Etiology: Extremity Wound Location: Right Knee - Anterior Wound Open Wounding Event: Pressure Injury Status: Date Acquired: 07/23/2016 Comorbid Anemia, Asthma, Arrhythmia, Weeks Of Treatment: 10 History: Congestive Heart Failure, Coronary Clustered Wound: No Artery Disease, Hypertension, Type II Diabetes, Osteoarthritis Photos Photo Uploaded By: [name] on 10/29/2016 11:44:43 Wound Measurements Length: (cm) 0.8 % Reduction in Width: (cm) 2.4 % Reduction in Depth: (cm) 0.4 Epithelializati Area: (cm) 1.508 Tunneling: Volume: (cm) 0.603 Undermining: Starting Pos Ending Posit Maximum Dist Area: 80.8% Volume: 23.2% on: None No Yes ition (o'clock): 4 ion (o'clock): 9 ance: (cm) 3 Wound Description Classification: Grade 1 Wound Margin: Flat and Intact Exudate Amount: Large Exudate Type: Serosanguineous Exudate Color: red, brown Gloeckner, [name]. ([id number]) Foul Odor After Cleansing: No Slough/Fibrino Yes Wound Bed Granulation Amount: Large (67-100%)  Exposed Structure Granulation Quality: Red Fascia Exposed: No Necrotic Amount: Small (1-33%) Fat Layer (Subcutaneous Tissue) Exposed: Yes Necrotic Quality: Adherent Slough Tendon Exposed: No Muscle Exposed: No Joint Exposed: No Bone Exposed: No Periwound Skin Texture Texture Color No Abnormalities Noted: No No Abnormalities Noted: No Callus: No Atrophie Blanche: No Crepitus: No Cyanosis: No Excoriation: No Ecchymosis: No Induration: No Erythema: Yes  Rash: No Erythema Location: Circumferential Scarring: No Hemosiderin Staining: No Mottled: No Moisture Pallor: No No Abnormalities Noted: No Rubor: No Dry / Scaly: No Maceration: No Temperature / Pain Temperature: No Abnormality Tenderness on Palpation: Yes Wound Preparation Ulcer Cleansing: Rinsed/Irrigated with Saline Topical Anesthetic Applied: Other: Lidocaine 4%, Treatment Notes Wound #1 (Right, Anterior Knee) 1. Cleansed with: Clean wound with Normal Saline 2. Anesthetic Topical Lidocaine 4% cream to wound bed prior to debridement 4. Dressing Applied: Aquacel Ag 5. Secondary Dressing Applied ABD Pad Dry Gauze Kerlix/Conform 7. Secured with Recruitment consultant) Signed: 10/29/2016 4:11:28 PM By: Marvis Repress ([id number]) Entered By: [name] on 10/29/2016 09:38:19 Sadler, [name] ([id number]) -------------------------------------------------------------------------------- Vitals Details Patient Name: [name]. Date of Service: 10/29/2016 9:45 AM Medical Record Number: [id number] Patient Account Number: [id number] Date of Birth/Sex: 1945-06-07 (71 y.o. Male) Treating RN: [name] Primary Care Treylan Mcclintock: [name] Other Clinician: Referring Icholas Irby: [name] Treating Tambria Pfannenstiel/Extender: [name] in Treatment: 10 Vital Signs Time Taken: 09:32 Temperature (F): 98.3 Height (in): 67 Pulse (bpm): 68 Weight (lbs):  357 Respiratory Rate (breaths/min): 16 Body Mass Index (BMI): 55.9 Blood Pressure (mmHg): 119/67 Reference Range: 80 - 120 mg / dl Electronic Signature(s) Signed: 10/29/2016 4:11:28 PM By: [name] Entered By: [name] on 10/29/2016 09:32:39

## 2016-11-05 ENCOUNTER — Encounter: Payer: Medicare Other | Admitting: Surgery

## 2016-11-05 DIAGNOSIS — E11622 Type 2 diabetes mellitus with other skin ulcer: Secondary | ICD-10-CM | POA: Diagnosis not present

## 2016-11-06 NOTE — Progress Notes (Addendum)
EDY, BELT ([id number]) Visit Report for 11/05/2016 Chief Complaint Document Details Patient Name: [name], [name]. Date of Service: 11/05/2016 9:45 AM Medical Record Number: [id number] Patient Account Number: [id number] Date of Birth/Sex: Apr 25, 1946 (71 y.o. Male) Treating RN: Ahmed Prima Primary Care Provider: [name] Dupre Other Clinician: Referring Provider: [name] Dupre Treating Provider/Extender: Frann Rider in Treatment: 11 Information Obtained from: Patient Chief Complaint Patient is at the clinic for treatment of an open pressure ulcer to the area of the right knee for about 3 weeks now Electronic Signature(s) Signed: 11/05/2016 10:10:56 AM By: Christin Fudge MD, FACS Entered By: Christin Fudge on 11/05/2016 10:10:56 [name] ([id number]) -------------------------------------------------------------------------------- HPI Details Patient Name: [name] Gathers T. Date of Service: 11/05/2016 9:45 AM Medical Record Number: [id number] Patient Account Number: [id number] Date of Birth/Sex: May 30, 1945 (71 y.o. Male) Treating RN: Ahmed Prima Primary Care Provider: [name] Dupre Other Clinician: Referring Provider: [name] Dupre Treating Provider/Extender: Frann Rider in Treatment: 11 History of Present Illness Location: right knee swelling with skin ulceration Quality: Patient reports experiencing a dull pain to affected area(s). Severity: Patient states wound are getting worse. Duration: Patient has had the wound for <4 weeks prior to presenting for treatment Timing: Pain in wound is Intermittent (comes and goes Context: The wound occurred when the patient had a cast placed over his right lower extremity and with the cast was removed he had a swelling of the knee with problems with his skin Modifying Factors: Other treatment(s) tried include:orthopedics Associated Signs and Symptoms: swelling of both lower extremities with large  amount of swelling of the knee HPI Description: 71 year old gentleman who has been living in an independent living facility for long-term care has had recurrent falls and recently had a large hematoma on the right knee. Known to be on aspirin and Plavix for history of CABG and was seen in the ER at the end of February after a fall. past medical history of arthritis, congestive heart failure, diabetes mellitus, hypertension, status post CABG o2, status post cholecystectomy, hernia repair and kidney surgery. recent workup for the fall showed negative clinical findings in his extremities and it was thought that he was falling due to deconditioning. a x-ray showed a right fibular neck fracture and was placed in a splint. he was asked to be seen by orthopedic surgeon. the patient cannot tell me which orthopedic doctor had seen him and I'm not able to get any notes from [place], regarding his orthopedic care. The patient says twice during this last week he has had bloody fluid shooting out of his knee and has drained all over his boot and the flow. 08/21/2016 -- he was seen by the nurse practitioner at the facility and and distend she aspirated about 60 mL of hemorrhagic fluid from his right knee. 08/31/2016 -- since I saw the patient last he was admitted to the hospital at [facility] between 08/23/2016 2 08/27/2016 and was treated with a pulmonary embolism. he had EKOS complete, was started on Xarelto, and also treated for chronic diastolic congestive heart failure. he was asked to follow-up with the wound center and continue management locally. 09/07/2016 -- he has an appointment to see his orthopedic surgeon later this week. 09/14/2016 -- he did see the orthopedic service and they have asked him to continue with physical therapy. They discontinued the boot and the x-ray done showed a healed fracture. They said it was okay to use a wound VAC if needed. Addendum -- we got the note from the  Orthopedic  office where he was seen by [facility] orthopedics on [facility] in [place] and was seen by the PA [name]. -- The x-ray which was done was reviewed and there is good healing of the fracture and from the orthopedic point of view he has healed well and they have asked him to continue with good control of his diabetes and management by the wound center including using a wound VAC if required. [name] ([id number]) 09/21/2016 -- the patient's wound VAC had been applied 2 days ago, but the black sponge had not been placed laterally under the skin flap and into the area of the right of the knee, where there was a large cavity. On examination, after removal of his wound VAC, there is a large collection of seropurulent fluid and hematoma in the area starting at the lateral knee and going down to his right lateral compartment of the leg. Clinically, this is a huge infected abscess cavity 10/01/2016 -- patient was admitted between 09/21/2016 and 09/23/2016 with cellulitis of the right lower extremity with a large infected hematoma which was drained at the bedside by surgical services. he was treated with Levaquin and continues to be on Xarelto. the patient is back on a wound VAC which has not been functioning due to the large depth of the hematoma and organized clots. He is still on Keflex orally. Addendum : I spoke to his surgeon Dr. Tama High and we discussed options for further draining this large hematoma which I suspect will get infected sooner than later. I expressed the fact that the wound VAC does not seem to be functioning well and competent enough to drain this large cavity. He will call for him in the office. [id number] 2018 -- the patient has hypotension in the sitting position and is a bit lethargic as compared to his normal self. I spoke personally to his nurse at the nursing homes, Ms Judson Roch and discussed his management including the fact that he had to have a surgical  consultation for the right lower leg, with Dr. Tama High. Prior to the patient's discharge from the wound center he continues to have hypotension and hence I'm going to talk to the ER physician and have him evaluated in the ER. 10/15/2016 -- the patient was admitted to the hospital between 10/08/2016 and 10/12/2016. He was treated for hypokalemia, hyponatremia, renal insufficiency, sepsis and possible abscess of the right lower leg. he was seen by surgical services and the fluid collection was drained with a Yankauer suction from the knee wound and the opinion of the surgeons was that he would not need any surgical intervention and continue with local wound care. I had personally communicated with Dr. Tama High regarding this. the patient is still on Xarelto. During the admission he was also seen by Dr. Ola Spurr for infectious disease consult and he recommended continue vancomycin for MRSA and change meropenem to ceftriaxone for this Proteus. He recommended a four-week course of antibiotic therapy and follow-up back with Dr. Ola Spurr in 4 weeks. before his discharge since there was clinical improvement Dr. Ola Spurr converted the treatment to oral clindamycin and oral Keflex for a four-week course of antibiotic therapy. He was to be monitored for C. difficile. 10/29/2016 -- he missed his appointment with Dr. Rosana Hoes at the surgical office. He continues to be on oral antibiotics as per Dr. Ola Spurr. Electronic Signature(s) Signed: 11/05/2016 10:11:03 AM By: Christin Fudge MD, FACS Entered By: Christin Fudge on 11/05/2016 10:11:03 [name], [name] (  [id number]) -------------------------------------------------------------------------------- Physical Exam Details Patient Name: [name], [name] Date of Service: 11/05/2016 9:45 AM Medical Record Number: [id number] Patient Account Number: [id number] Date of Birth/Sex: Feb 11, 1946 (71 y.o. Male) Treating RN: Ahmed Prima Primary Care  Provider: [name] Dupre Other Clinician: Referring Provider: [name] Dupre Treating Provider/Extender: Frann Rider in Treatment: 11 Constitutional . Pulse regular. Respirations normal and unlabored. Afebrile. . Eyes Nonicteric. Reactive to light. Ears, Nose, Mouth, and Throat Lips, teeth, and gums WNL.[name] Moist mucosa without lesions. Neck supple and nontender. No palpable supraclavicular or cervical adenopathy. Normal sized without goiter. Respiratory WNL. No retractions.. Breath sounds WNL, No rubs, rales, rhonchi, or wheeze.. Cardiovascular Heart rhythm and rate regular, no murmur or gallop.. Pedal Pulses WNL. No clubbing, cyanosis or edema. Chest Breasts symmetical and no nipple discharge.. Breast tissue WNL, no masses, lumps, or tenderness.. Lymphatic No adneopathy. No adenopathy. No adenopathy. Musculoskeletal Adexa without tenderness or enlargement.. Digits and nails w/o clubbing, cyanosis, infection, petechiae, ischemia, or inflammatory conditions.. Integumentary (Hair, Skin) No suspicious lesions. No crepitus or fluctuance. No peri-wound warmth or erythema. No masses.[name] Psychiatric Judgement and insight Intact.. No evidence of depression, anxiety, or agitation.. Notes the wound on the right knee continues to be sealed off from the lower hematoma on the lower part of his right leg. The area has minimal debris and I washed this out with moist saline gauze and we will do an appropriate dressing. Electronic Signature(s) Signed: 11/05/2016 10:11:48 AM By: Christin Fudge MD, FACS Entered By: Christin Fudge on 11/05/2016 10:11:48 [name] ([id number]) -------------------------------------------------------------------------------- Physician Orders Details Patient Name: [name] Gathers T. Date of Service: 11/05/2016 9:45 AM Medical Record Number: [id number] Patient Account Number: [id number] Date of Birth/Sex: [birth year] (71 y.o. 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 o Cleanse wound with mild soap and water Anesthetic Wound #1 Right,Anterior Knee o Topical Lidocaine 4% cream applied to wound bed prior to debridement - Only in the Wound Clinic Primary Wound Dressing Wound #1 Right,Anterior Knee o Aquacel Ag - silver alginate rope pack lightly in wound Secondary Dressing Wound #1 Right,Anterior Knee o ABD pad o Dry Gauze o Conform/Kerlix o Other - tape Dressing Change Frequency Wound #1 Right,Anterior Knee o Change dressing every day. LAIKEN, NOHR ([id number]) o Other: - as needed Follow-up Appointments Wound #1 Right,Anterior Knee o Return Appointment in 1 week. Edema Control Wound #1 Right,Anterior Knee o Elevate legs to the level of the heart and pump ankles as often as possible Additional Orders / Instructions Wound #1 Right,Anterior Knee o Increase protein intake. o Activity as tolerated Medications-please add to medication list. Wound #1 Right,Anterior Knee o Other: - Vitamin C, Zinc, MVI Electronic Signature(s) Signed: 11/05/2016 4:29:00 PM By: Christin Fudge MD, FACS Signed: 11/05/2016 5:25:16 PM By: Alric Quan Entered By: Alric Quan on 11/05/2016  10:11:33 [name], [name] ([id number]) -------------------------------------------------------------------------------- Problem List Details Patient Name: [name], [name]  Date of Service: 11/05/2016 9:45 AM Medical Record Number: [id number] Patient Account Number: [id number] Date of Birth/Sex: 14-Jan-1946 (71 y.o. 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, right knee 08/14/2016 Yes L97.812 Non-pressure chronic ulcer of other part of right lower leg 08/14/2016 Yes with fat layer exposed L89.892 Pressure ulcer of other site, stage 2 08/14/2016 Yes M70.861 Other soft tissue disorders related to use, overuse and 08/14/2016 Yes pressure, right lower leg M71.061 Abscess of bursa, right knee 09/21/2016 Yes Inactive Problems Resolved Problems Electronic Signature(s) Signed: 11/05/2016 10:10:42 AM By: Christin Fudge MD, FACS Entered By: Christin Fudge on 11/05/2016 10:10:42 Wisman, [name] ([id number]) -------------------------------------------------------------------------------- Progress Note Details Patient Name: [name] Gathers T. Date of Service: 11/05/2016 9:45 AM Medical Record Number: [id number] Patient Account Number: [id number] Date of Birth/Sex: 06/08/1945 (71 y.o. Male) Treating RN: Ahmed Prima Primary Care Provider: [name] Dupre Other Clinician: Referring Provider: [name] Dupre Treating Provider/Extender: Frann Rider in Treatment: 11 Subjective Chief Complaint Information obtained from Patient Patient is at the clinic for treatment of an open pressure ulcer to the area of the right knee for about 3 weeks now History of Present Illness  (HPI) The following HPI elements were documented for the patient's wound: Location: right knee swelling with skin ulceration Quality: Patient reports experiencing a dull pain to affected area(s). Severity: Patient states wound are getting worse. Duration: Patient has had the wound for Timing: Pain in wound is Intermittent (comes and goes Context: The wound occurred when the patient had a cast placed over his right lower extremity and with the cast was removed he had a swelling of the knee with problems with his skin Modifying Factors: Other treatment(s) tried include:orthopedics Associated Signs and Symptoms: swelling of both lower extremities with large amount of swelling of the knee 71 year old gentleman who has been living in an independent living facility for long-term care has had recurrent falls and recently had a large hematoma on the right knee. Known to be on aspirin and Plavix for history of CABG and was seen in the ER at the end of February after a fall. past medical history of arthritis, congestive heart failure, diabetes mellitus, hypertension, status post CABG o2, status post cholecystectomy, hernia repair and kidney surgery. recent workup for the fall showed negative clinical findings in his extremities and it was thought that he was falling due to deconditioning. a x-ray showed a right fibular neck fracture and was placed in a splint. he was asked to be seen by orthopedic surgeon. the patient cannot tell me which orthopedic doctor had seen him and I'm not able to get any notes from [place], regarding his orthopedic care. The patient says twice during this last week he has had bloody fluid shooting out of his knee and has drained all over his boot and the flow. 08/21/2016 -- he was seen by the nurse practitioner at the facility and and distend she aspirated about 60 mL of hemorrhagic fluid from his right knee. 08/31/2016 -- since I saw the patient last he was admitted to the  hospital at [facility] between 08/23/2016 2 08/27/2016 and was treated with a pulmonary embolism. he had EKOS complete, was started on Xarelto, and also treated for chronic diastolic congestive heart failure. he was asked to follow-up with the  wound center and continue management locally. 09/07/2016 -- he has an appointment to see his orthopedic surgeon later this week. 09/14/2016 -- he did see the orthopedic service and they have asked him to continue with physical therapy. MARDY, LUCIER ([id number]) They discontinued the boot and the x-ray done showed a healed fracture. They said it was okay to use a wound VAC if needed. Addendum -- we got the note from the Orthopedic office where he was seen by [facility] orthopedics on [facility] in [place] and was seen by the PA [name]. -- The x-ray which was done was reviewed and there is good healing of the fracture and from the orthopedic point of view he has healed well and they have asked him to continue with good control of his diabetes and management by the wound center including using a wound VAC if required. 09/21/2016 -- the patient's wound VAC had been applied 2 days ago, but the black sponge had not been placed laterally under the skin flap and into the area of the right of the knee, where there was a large cavity. On examination, after removal of his wound VAC, there is a large collection of seropurulent fluid and hematoma in the area starting at the lateral knee and going down to his right lateral compartment of the leg. Clinically, this is a huge infected abscess cavity 10/01/2016 -- patient was admitted between 09/21/2016 and 09/23/2016 with cellulitis of the right lower extremity with a large infected hematoma which was drained at the bedside by surgical services. he was treated with Levaquin and continues to be on Xarelto. the patient is back on a wound VAC which has not been functioning due to the large depth of the  hematoma and organized clots. He is still on Keflex orally. Addendum : I spoke to his surgeon Dr. Tama High and we discussed options for further draining this large hematoma which I suspect will get infected sooner than later. I expressed the fact that the wound VAC does not seem to be functioning well and competent enough to drain this large cavity. He will call for him in the office. [id number] 2018 -- the patient has hypotension in the sitting position and is a bit lethargic as compared to his normal self. I spoke personally to his nurse at the nursing homes, Ms Judson Roch and discussed his management including the fact that he had to have a surgical consultation for the right lower leg, with Dr. Tama High. Prior to the patient's discharge from the wound center he continues to have hypotension and hence I'm going to talk to the ER physician and have him evaluated in the ER. 10/15/2016 -- the patient was admitted to the hospital between 10/08/2016 and 10/12/2016. He was treated for hypokalemia, hyponatremia, renal insufficiency, sepsis and possible abscess of the right lower leg. he was seen by surgical services and the fluid collection was drained with a Yankauer suction from the knee wound and the opinion of the surgeons was that he would not need any surgical intervention and continue with local wound care. I had personally communicated with Dr. Tama High regarding this. the patient is still on Xarelto. During the admission he was also seen by Dr. Ola Spurr for infectious disease consult and he recommended continue vancomycin for MRSA and change meropenem to ceftriaxone for this Proteus. He recommended a four-week course of antibiotic therapy and follow-up back with Dr. Ola Spurr in 4 weeks. before his discharge since there was clinical improvement Dr. Ola Spurr  converted the treatment to oral clindamycin and oral Keflex for a four-week course of antibiotic therapy. He was to be monitored for  C. difficile. 10/29/2016 -- he missed his appointment with Dr. Rosana Hoes at the surgical office. He continues to be on oral antibiotics as per Dr. Ola Spurr. Objective [name], [name] ([id number]) Constitutional Pulse regular. Respirations normal and unlabored. Afebrile. Vitals Time Taken: 9:50 AM, Height: 67 in, Weight: 357 lbs, BMI: 55.9, Temperature: 98.2 F, Pulse: 76 bpm, Respiratory Rate: 16 breaths/min, Blood Pressure: 102/58 mmHg, Pulse Oximetry: 97 %. Eyes Nonicteric. Reactive to light. Ears, Nose, Mouth, and Throat Lips, teeth, and gums WNL.[name] Moist mucosa without lesions. Neck supple and nontender. No palpable supraclavicular or cervical adenopathy. Normal sized without goiter. Respiratory WNL. No retractions.. Breath sounds WNL, No rubs, rales, rhonchi, or wheeze.. Cardiovascular Heart rhythm and rate regular, no murmur or gallop.. Pedal Pulses WNL. No clubbing, cyanosis or edema. Chest Breasts symmetical and no nipple discharge.. Breast tissue WNL, no masses, lumps, or tenderness.. Lymphatic No adneopathy. No adenopathy. No adenopathy. Musculoskeletal Adexa without tenderness or enlargement.. Digits and nails w/o clubbing, cyanosis, infection, petechiae, ischemia, or inflammatory conditions.[name] Psychiatric Judgement and insight Intact.. No evidence of depression, anxiety, or agitation.. General Notes: the wound on the right knee continues to be sealed off from the lower hematoma on the lower part of his right leg. The area has minimal debris and I washed this out with moist saline gauze and we will do an appropriate dressing. Integumentary (Hair, Skin) No suspicious lesions. No crepitus or fluctuance. No peri-wound warmth or erythema. No masses.. Wound #1 status is Open. Original cause of wound was Pressure Injury. The wound is located on the Right,Anterior Knee. The wound measures 0.5cm length x 1.6cm width x 0.3cm depth; 0.628cm^2 area and 0.188cm^3 volume. There is Fat  Layer (Subcutaneous Tissue) Exposed exposed. There is no tunneling noted, however, there is undermining starting at 5:00 and ending at 7:00 with a maximum distance of 2.5cm. There is a large amount of serosanguineous drainage noted. The wound margin is flat and intact. There is large (67-100%) red granulation within the wound bed. There is a small (1-33%) amount of necrotic [name], [name] ([id number]) tissue within the wound bed including Adherent Slough. The periwound skin appearance exhibited: Erythema. The periwound skin appearance did not exhibit: Callus, Crepitus, Excoriation, Induration, Rash, Scarring, Dry/Scaly, Maceration, Atrophie Blanche, Cyanosis, Ecchymosis, Hemosiderin Staining, Mottled, Pallor, Rubor. The surrounding wound skin color is noted with erythema which is circumferential. Periwound temperature was noted as No Abnormality. The periwound has tenderness on palpation. 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 Cleanse wound with mild soap and water Anesthetic: Wound #1 Right,Anterior Knee: Topical Lidocaine 4% cream applied to wound bed prior to debridement - Only in the Wound Clinic Primary Wound Dressing: Wound #1 Right,Anterior Knee: Aquacel Ag - silver alginate rope pack lightly in wound Secondary Dressing: Wound #1 Right,Anterior Knee: ABD pad Dry Gauze Conform/Kerlix Other - tape Dressing Change Frequency: Wound #1 Right,Anterior Knee: Change dressing every day. Other: - as needed Follow-up  Appointments: Wound #1 Right,Anterior  Knee: [name], [name] ([id number]) Return Appointment in 1 week. Edema Control: Wound #1 Right,Anterior Knee: Elevate legs to the level of the heart and pump ankles as often as possible Additional Orders / Instructions: Wound #1 Right,Anterior Knee: Increase protein intake. Activity as tolerated Medications-please add to medication list.: Wound #1 Right,Anterior Knee: Other: - Vitamin C, Zinc, MVI The patient has improved over the last 2 weeks and the organized hematoma does not seem to be infected in his right lateral compartment. After careful examination of the patient's right lower extremity, I have recommended: 1. Packing the wound at the knee with Silver alginate rope to be changed daily and when necessary. 2. Continue with oral antibiotics as per Dr. Ola Spurr. Electronic Signature(s) Signed: 11/05/2016 10:12:38 AM By: Christin Fudge MD, FACS Entered By: Christin Fudge on 11/05/2016 10:12:38 Flemings, [name] ([id number]) -------------------------------------------------------------------------------- SuperBill Details Patient Name: [name] Gathers T. Date of Service: 11/05/2016 Medical Record Number: [id number] Patient Account Number: [id number] Date of Birth/Sex: [birth year] (71 y.o. 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 L89.892 Pressure ulcer of other site, stage 2 M70.861 Other soft tissue disorders related to use, overuse and pressure, right lower leg M71.061 Abscess of bursa, right knee Facility Procedures CPT4 Code: 38333832 Description:  99213 - WOUND CARE VISIT-LEV 3 EST PT Modifier: Quantity: 1 Physician Procedures CPT4: Description Modifier Quantity Code 9191660 60045 - WC PHYS LEVEL 3 - EST PT 1 ICD-10 Description Diagnosis 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

## 2016-11-07 NOTE — Progress Notes (Signed)
THERRON, SELLS ([id number]) Visit Report for 11/05/2016 Arrival Information Details Patient Name: [name], [name]. Date of Service: 11/05/2016 9:45 AM Medical Record Number: [id number] Patient Account Number: [id number] Date of Birth/Sex: 1946/01/21 (71 y.o. Male) Treating RN: Ahmed Prima Primary Care Shadman Tozzi: [name] Dupre Other Clinician: Referring Halen Antenucci: [name] Dupre Treating Jalik Gellatly/Extender: Frann Rider in Treatment: 11 Visit Information History Since Last Visit All ordered tests and consults were completed: No Patient Arrived: Wheel Chair Added or deleted any medications: No Arrival Time: 09:46 Any new allergies or adverse reactions: No Accompanied By: self Had a fall or experienced change in No Transfer Assistance: EasyP[employer] activities of daily living that may affect Patient Lift risk of falls: Patient Identification Verified: Yes Signs or symptoms of abuse/neglect since last No Secondary Verification Process Yes visito Completed: Hospitalized since last visit: No Patient Requires Transmission- No Has Dressing in Place as Prescribed: Yes Based Precautions: Pain Present Now: No Patient Has Alerts: No Electronic Signature(s) Signed: 11/05/2016 5:25:16 PM By: Alric Quan Entered By: Alric Quan on 11/05/2016 09:50:05 [name], [name] ([id number]) -------------------------------------------------------------------------------- Clinic Level of Care Assessment Details Patient Name: [name] Mule. Date of Service: 11/05/2016 9:45 AM Medical Record Number: [id number] Patient Account Number: [id number] Date of Birth/Sex: 27-Oct-1945 (71 y.o. Male) Treating RN: Ahmed Prima Primary Care [place]: [name] Dupre Other Clinician: Referring Micharl Helmes: [name] Dupre Treating Tharun Cappella/Extender: Frann Rider in Treatment: 11 Clinic Level of Care Assessment Items TOOL 4 Quantity Score X - Use when only an EandM is performed  on FOLLOW-UP visit 1 0 ASSESSMENTS - Nursing Assessment / Reassessment X - Reassessment of Co-morbidities (includes updates in patient status) 1 10 X - Reassessment of Adherence to Treatment Plan 1 5 ASSESSMENTS - Wound and Skin Assessment / Reassessment X - Simple Wound Assessment / Reassessment - one wound 1 5 []  - Complex Wound Assessment / Reassessment - multiple wounds 0 []  - Dermatologic / Skin Assessment (not related to wound area) 0 ASSESSMENTS - Focused Assessment []  - Circumferential Edema Measurements - multi extremities 0 []  - Nutritional Assessment / Counseling / Intervention 0 []  - Lower Extremity Assessment (monofilament, tuning fork, pulses) 0 []  - Peripheral Arterial Disease Assessment (using hand held doppler) 0 ASSESSMENTS - Ostomy and/or Continence Assessment and Care []  - Incontinence Assessment and Management 0 []  - Ostomy Care Assessment and Management (repouching, etc.) 0 PROCESS - Coordination of Care []  - Simple Patient / Family Education for ongoing care 0 X - Complex (extensive) Patient / Family Education for ongoing care 1 20 X - Staff obtains Programmer, systems, Records, Test Results / Process Orders 1 10 X - Staff telephones HHA, Nursing Homes / Clarify orders / etc 1 10 []  - Routine Transfer to another Facility (non-emergent condition) 0 Oceguera, Markham T. ([id number]) []  - Routine Hospital Admission (non-emergent condition) 0 []  - New Admissions / Biomedical engineer / Ordering NPWT, Apligraf, etc. 0 []  - Emergency Hospital Admission (emergent condition) 0 X - Simple Discharge Coordination 1 10 []  - Complex (extensive) Discharge Coordination 0 PROCESS - Special Needs []  - Pediatric / Minor Patient Management 0 []  - Isolation Patient Management 0 []  - Hearing / Language / Visual special needs 0 []  - Assessment of Community assistance (transportation, D/C planning, etc.) 0 []  - Additional assistance / Altered mentation 0 []  - Support Surface(s) Assessment  (bed, cushion, seat, etc.) 0 INTERVENTIONS - Wound Cleansing / Measurement X - Simple Wound Cleansing - one wound 1 5 []  - Complex Wound Cleansing - multiple wounds 0  X - Wound Imaging (photographs - any number of wounds) 1 5 []  - Wound Tracing (instead of photographs) 0 X - Simple Wound Measurement - one wound 1 5 []  - Complex Wound Measurement - multiple wounds 0 INTERVENTIONS - Wound Dressings []  - Small Wound Dressing one or multiple wounds 0 X - Medium Wound Dressing one or multiple wounds 1 15 []  - Large Wound Dressing one or multiple wounds 0 []  - Application of Medications - topical 0 []  - Application of Medications - injection 0 INTERVENTIONS - Miscellaneous []  - External ear exam 0 Tudisco, Jarmar T. ([id number]) []  - Specimen Collection (cultures, biopsies, blood, body fluids, etc.) 0 []  - Specimen(s) / Culture(s) sent or taken to Lab for analysis 0 []  - Patient Transfer (multiple staff / Harrel Lemon Lift / Similar devices) 0 []  - Simple Staple / Suture removal (25 or less) 0 []  - Complex Staple / Suture removal (26 or more) 0 []  - Hypo / Hyperglycemic Management (close monitor of Blood Glucose) 0 []  - Ankle / Brachial Index (ABI) - do not check if billed separately 0 X - Vital Signs 1 5 Has the patient been seen at the hospital within the last three years: Yes Total Score: 105 Level Of Care: New/Established - Level 3 Electronic Signature(s) Signed: 11/05/2016 5:25:16 PM By: Alric Quan Entered By: Alric Quan on 11/05/2016 10:22:51 [name], [name] ([id number]) -------------------------------------------------------------------------------- Encounter Discharge Information Details Patient Name: [name] Gathers T. Date of Service: 11/05/2016 9:45 AM Medical Record Number: [id number] Patient Account Number: [id number] Date of Birth/Sex: May 31, 1945 (71 y.o. Male) Treating RN: Ahmed Prima Primary Care Kessie Croston: [name] Dupre Other Clinician: Referring Dalissa Lovin:  [name] Dupre Treating Nhia Heaphy/Extender: Frann Rider in Treatment: 47 Encounter Discharge Information Items Discharge Pain Level: 0 Discharge Condition: Stable Ambulatory Status: Wheelchair Discharge Destination: Nursing Home Transportation: Other Accompanied By: self Schedule Follow-up Appointment: Yes Medication Reconciliation completed and provided to Patient/Care No Rachell Druckenmiller: Provided on Clinical Summary of Care: 11/05/2016 Form Type Recipient Paper Patient JE Electronic Signature(s) Signed: 11/05/2016 5:25:16 PM By: Alric Quan Previous Signature: 11/05/2016 10:16:03 AM Version By: Ruthine Dose Entered By: Alric Quan on 11/05/2016 10:20:00 [name], [name] ([id number]) -------------------------------------------------------------------------------- Lower Extremity Assessment Details Patient Name: [name] Gathers T. Date of Service: 11/05/2016 9:45 AM Medical Record Number: [id number] Patient Account Number: [id number] Date of Birth/Sex: December 21, 1945 (71 y.o. Male) Treating RN: Ahmed Prima Primary Care Juanpablo Ciresi: [name] Dupre Other Clinician: Referring Marieanne Marxen: [name] Dupre Treating Fareed Fung/Extender: Frann Rider in Treatment: 11 Vascular Assessment Pulses: Dorsalis Pedis Palpable: [Right:Yes] Posterior Tibial Extremity colors, hair growth, and conditions: Extremity Color: [Right:Hyperpigmented] Hair Growth on Extremity: [Right:No] Temperature of Extremity: [Right:Warm] Capillary Refill: [Right:< 3 seconds] Toe Nail Assessment Left: Right: Thick: Yes Discolored: Yes Deformed: No Improper Length and Hygiene: No Electronic Signature(s) Signed: 11/05/2016 5:25:16 PM By: Alric Quan Entered By: Alric Quan on 11/05/2016 09:59:11 [name], [name] ([id number]) -------------------------------------------------------------------------------- Multi Wound Chart Details Patient Name: [name] Gathers T. Date of Service:  11/05/2016 9:45 AM Medical Record Number: [id number] Patient Account Number: [id number] Date of Birth/Sex: 14-Nov-1945 (71 y.o. Male) Treating RN: Ahmed Prima Primary Care Winfred Redel: [name] Dupre Other Clinician: Referring Deago Burruss: [name] Dupre Treating Connie Lasater/Extender: Frann Rider in Treatment: 11 Vital Signs Height(in): 67 Pulse(bpm): 76 Weight(lbs): 357 Blood Pressure 102/58 (mmHg): Body Mass Index(BMI): 56 Temperature(F): 98.2 Respiratory Rate 16 (breaths/min): Photos: [1:No Photos] [N/A:N/A] Wound Location: [1:Right Knee - Anterior] [N/A:N/A] Wounding Event: [1:Pressure Injury] [N/A:N/A] Primary Etiology: [1:Diabetic Wound/Ulcer of the Lower Extremity] [N/A:N/A] Comorbid History: [1:Anemia, Asthma,  Arrhythmia, Congestive Heart Failure, Coronary Artery Disease, Hypertension, Type II Diabetes, Osteoarthritis] [N/A:N/A] Date Acquired: [1:07/23/2016] [N/A:N/A] Weeks of Treatment: [1:11] [N/A:N/A] Wound Status: [1:Open] [N/A:N/A] Measurements L x W x D 0.5x1.6x0.3 [N/A:N/A] (cm) Area (cm) : [1:0.628] [N/A:N/A] Volume (cm) : [1:0.188] [N/A:N/A] % Reduction in Area: [1:92.00%] [N/A:N/A] % Reduction in Volume: 76.10% [N/A:N/A] Starting Position 1 5 (o'clock): Ending Position 1 [1:7] (o'clock): Maximum Distance 1 2.5 (cm): Undermining: [1:Yes] [N/A:N/A] Classification: [1:Grade 1] [N/A:N/A] Exudate Amount: [1:Large] [N/A:N/A] Exudate Type: [1:Serosanguineous] [N/A:N/A] Exudate Color: red, brown N/A N/A Wound Margin: Flat and Intact N/A N/A Granulation Amount: Large (67-100%) N/A N/A Granulation Quality: Red N/A N/A Necrotic Amount: Small (1-33%) N/A N/A Exposed Structures: Fat Layer (Subcutaneous N/A N/A Tissue) Exposed: Yes Fascia: No Tendon: No Muscle: No Joint: No Bone: No Epithelialization: None N/A N/A Periwound Skin Texture: Excoriation: No N/A N/A Induration: No Callus: No Crepitus: No Rash: No Scarring: No Periwound Skin  Maceration: No N/A N/A Moisture: Dry/Scaly: No Periwound Skin Color: Erythema: Yes N/A N/A Atrophie Blanche: No Cyanosis: No Ecchymosis: No Hemosiderin Staining: No Mottled: No Pallor: No Rubor: No Erythema Location: Circumferential N/A N/A Temperature: No Abnormality N/A N/A Tenderness on Yes N/A N/A Palpation: Wound Preparation: Ulcer Cleansing: N/A N/A Rinsed/Irrigated with Saline Topical Anesthetic Applied: Other: Lidocaine 4% Treatment Notes Electronic Signature(s) Signed: 11/05/2016 10:10:50 AM By: Christin Fudge MD, FACS Entered By: Christin Fudge on 11/05/2016 10:10:50 Heck, [name] ([id number]) -------------------------------------------------------------------------------- [place] Details Patient Name: [name] Gathers T. Date of Service: 11/05/2016 9:45 AM Medical Record Number: [id number] Patient Account Number: [id number] Date of Birth/Sex: 30-Oct-1945 (71 y.o. Male) Treating RN: Carolyne Fiscal, Debi Primary Care Shalondra Wunschel: [name] Dupre Other Clinician: Referring Cordero Surette: [name] Dupre Treating Rasul Decola/Extender: Frann Rider in Treatment: 11 Active Inactive ` Orientation to the Wound Care Program Nursing Diagnoses: Knowledge deficit related to the wound healing center program Goals: Patient/caregiver will verbalize understanding of the [place] Program Date Initiated: 08/14/2016 Target Resolution Date: 12/14/2016 Goal Status: Active Interventions: Provide education on orientation to the wound center Notes: ` Pressure Nursing Diagnoses: Knowledge deficit related to causes and risk factors for pressure ulcer development Knowledge deficit related to management of pressures ulcers Potential for impaired tissue integrity related to pressure, friction, moisture, and shear Goals: Patient will remain free from development of additional pressure ulcers Date Initiated: 08/14/2016 Target Resolution Date:  12/14/2016 Goal Status: Active Patient will remain free of pressure ulcers Date Initiated: 08/14/2016 Target Resolution Date: 12/14/2016 Goal Status: Active Patient/caregiver will verbalize risk factors for pressure ulcer development Date Initiated: 08/14/2016 Target Resolution Date: 12/14/2016 Goal Status: Active Patient/caregiver will verbalize understanding of pressure ulcer management Date Initiated: 08/14/2016 Target Resolution Date: 12/14/2016 Goal Status: Active [name], [name] ([id number]) Interventions: Assess: immobility, friction, shearing, incontinence upon admission and as needed Assess offloading mechanisms upon admission and as needed Assess potential for pressure ulcer upon admission and as needed Provide education on pressure ulcers Treatment Activities: Patient referred for seating evaluation to ensure proper offloading : 08/14/2016 Pressure reduction/relief device ordered : 08/14/2016 Notes: ` Wound/Skin Impairment Nursing Diagnoses: Impaired tissue integrity Knowledge deficit related to ulceration/compromised skin integrity Goals: Patient/caregiver will verbalize understanding of skin care regimen Date Initiated: 08/14/2016 Target Resolution Date: 12/14/2016 Goal Status: Active Ulcer/skin breakdown will have a volume reduction of 30% by week 4 Date Initiated: 08/14/2016 Target Resolution Date: 12/14/2016 Goal Status: Active Ulcer/skin breakdown will have a volume reduction of 50% by week 8 Date Initiated: 08/14/2016 Target Resolution Date: 12/14/2016 Goal Status: Active Ulcer/skin  breakdown will have a volume reduction of 80% by week 12 Date Initiated: 08/14/2016 Target Resolution Date: 12/14/2016 Goal Status: Active Ulcer/skin breakdown will heal within 14 weeks Date Initiated: 08/14/2016 Target Resolution Date: 12/14/2016 Goal Status: Active Interventions: Assess patient/caregiver ability to obtain necessary supplies Assess patient/caregiver ability to perform  ulcer/skin care regimen upon admission and as needed Assess ulceration(s) every visit Provide education on ulcer and skin care Treatment Activities: Referred to DME Yolander Goodie for dressing supplies : 08/14/2016 Skin care regimen initiated : 08/14/2016 [name], [name] ([id number]) Topical wound management initiated : 08/14/2016 Notes: Electronic Signature(s) Signed: 11/05/2016 5:25:16 PM By: Alric Quan Entered By: Alric Quan on 11/05/2016 09:59:15 [name], [name] ([id number]) -------------------------------------------------------------------------------- Pain Assessment Details Patient Name: [name] Gathers T. Date of Service: 11/05/2016 9:45 AM Medical Record Number: [id number] Patient Account Number: [id number] Date of Birth/Sex: 1945/08/18 (71 y.o. Male) Treating RN: Ahmed Prima Primary Care Jacoby Zanni: [name] Dupre Other Clinician: Referring Lamar Naef: [name] Dupre Treating Elora Wolter/Extender: Frann Rider in Treatment: 11 Active Problems Location of Pain Severity and Description of Pain Patient Has Paino No Site Locations With Dressing Change: No Pain Management and Medication Current Pain Management: Electronic Signature(s) Signed: 11/05/2016 5:25:16 PM By: Alric Quan Entered By: Alric Quan on 11/05/2016 09:50:15 [name], [name] ([id number]) -------------------------------------------------------------------------------- Patient/Caregiver Education Details Patient Name: [name] Mule. Date of Service: 11/05/2016 9:45 AM Medical Record Number: [id number] Patient Account Number: [id number] Date of Birth/Gender: [birth year] (71 y.o. Male) Treating RN: Ahmed Prima Primary Care Physician: [name] Dupre Other Clinician: Referring Physician: [name] Dupre Treating Physician/Extender: Frann Rider in Treatment: 11 Education Assessment Education Provided To: Patient Education Topics Provided Wound/Skin  Impairment: Handouts: Other: change dressing as ordered Methods: Demonstration, Explain/Verbal Responses: State content correctly Electronic Signature(s) Signed: 11/05/2016 5:25:16 PM By: Alric Quan Entered By: Alric Quan on 11/05/2016 10:20:10 [name], [name] ([id number]) -------------------------------------------------------------------------------- Wound Assessment Details Patient Name: [name] Gathers T. Date of Service: 11/05/2016 9:45 AM Medical Record Number: [id number] Patient Account Number: [id number] Date of Birth/Sex: February 02, 1946 (71 y.o. Male) Treating RN: Ahmed Prima Primary Care Haven Pylant: [name] Dupre Other Clinician: Referring Bettina Warn: [name] Dupre Treating Yukari Flax/Extender: Frann Rider in Treatment: 11 Wound Status Wound Number: 1 Primary Diabetic Wound/Ulcer of the Lower Etiology: Extremity Wound Location: Right Knee - Anterior Wound Open Wounding Event: Pressure Injury Status: Date Acquired: 07/23/2016 Comorbid Anemia, Asthma, Arrhythmia, Weeks Of Treatment: 11 History: Congestive Heart Failure, Coronary Clustered Wound: No Artery Disease, Hypertension, Type II Diabetes, Osteoarthritis Photos Photo Uploaded By: Gretta Cool, BSN, RN, CWS, Kim on 11/05/2016 12:08:36 Wound Measurements Length: (cm) 0.5 % Reduction in Width: (cm) 1.6 % Reduction in Depth: (cm) 0.3 Epithelializati Area: (cm) 0.628 Tunneling: Volume: (cm) 0.188 Undermining: Starting Pos Ending Posit Maximum Dist Area: 92% Volume: 76.1% on: None No Yes ition (o'clock): 5 ion (o'clock): 7 ance: (cm) 2.5 Wound Description Classification: Grade 1 Foul Odor After Wound Margin: Flat and Intact Slough/Fibrino Exudate Amount: Large Exudate Type: Serosanguineous Exudate Color: red, brown Cleansing: No Yes Wound Bed Granulation Amount: Large (67-100%) Exposed Structure Chuba, Aldric T. ([id number]) Granulation Quality: Red Fascia Exposed: No Necrotic  Amount: Small (1-33%) Fat Layer (Subcutaneous Tissue) Exposed: Yes Necrotic Quality: Adherent Slough Tendon Exposed: No Muscle Exposed: No Joint Exposed: No Bone Exposed: No Periwound Skin Texture Texture Color No Abnormalities Noted: No No Abnormalities Noted: No Callus: No Atrophie Blanche: No Crepitus: No Cyanosis: No Excoriation: No Ecchymosis: No Induration: No Erythema: Yes Rash: No Erythema Location: Circumferential Scarring: No Hemosiderin Staining: No Mottled: No Moisture Pallor: No  No Abnormalities Noted: No Rubor: No Dry / Scaly: No Maceration: No Temperature / Pain Temperature: No Abnormality Tenderness on Palpation: Yes Wound Preparation Ulcer Cleansing: Rinsed/Irrigated with Saline Topical Anesthetic Applied: Other: Lidocaine 4%, Treatment Notes Wound #1 (Right, Anterior Knee) 1. Cleansed with: Clean wound with Normal Saline 2. Anesthetic Topical Lidocaine 4% cream to wound bed prior to debridement 4. Dressing Applied: Aquacel Ag 5. Secondary Dressing Applied ABD Pad Dry Gauze Kerlix/Conform 7. Secured with Recruitment consultant) Signed: 11/05/2016 5:25:16 PM By: Alric Quan Entered By: Alric Quan on 11/05/2016 09:58:24 [place], [name] ([id number]) [place], [name] ([id number]) -------------------------------------------------------------------------------- Vitals Details Patient Name: [name] Gathers T. Date of Service: 11/05/2016 9:45 AM Medical Record Number: [id number] Patient Account Number: [id number] Date of Birth/Sex: 1946/05/21 (71 y.o. Male) Treating RN: Ahmed Prima Primary Care Greyson Peavy: [name] Dupre Other Clinician: Referring Delesa Kawa: [name] Dupre Treating Ericia Moxley/Extender: Frann Rider in Treatment: 11 Vital Signs Time Taken: 09:50 Temperature (F): 98.2 Height (in): 67 Pulse (bpm): 76 Weight (lbs): 357 Respiratory Rate (breaths/min): 16 Body Mass Index (BMI): 55.9 Blood Pressure  (mmHg): 102/58 Reference Range: 80 - 120 mg / dl Pulse Oximetry (%): 97 Electronic Signature(s) Signed: 11/05/2016 5:25:16 PM By: Alric Quan Entered By: Alric Quan on 11/05/2016 09:53:07

## 2016-11-12 ENCOUNTER — Ambulatory Visit: Payer: Medicare Other | Admitting: Physician Assistant

## 2016-11-16 ENCOUNTER — Encounter: Payer: Medicare Other | Admitting: Physician Assistant

## 2016-11-16 DIAGNOSIS — E11622 Type 2 diabetes mellitus with other skin ulcer: Secondary | ICD-10-CM | POA: Diagnosis not present

## 2016-11-17 NOTE — Progress Notes (Signed)
[name] [name] ([id number]) Visit Report for 11/16/2016 Chief Complaint Document Details Patient Name: [name] [name] [name]. Date of Service: 11/16/2016 8:15 AM Medical Record Number: [id number] Patient Account Number: [id number] Date of Birth/Sex: 03-09-1946 (71 y.o. Male) Treating RN: Phillis Haggis Primary Care Provider: Bluford Main Other Clinician: Referring Provider: Bluford Main Treating Provider/Extender: Linwood Dibbles, Aspyn Warnke Weeks in Treatment: 13 Information Obtained from: Patient Chief Complaint Patient is at the clinic for treatment of an open pressure ulcer to the area of the right knee Electronic Signature(s) Signed: 11/16/2016 5:09:57 PM By: Lenda Kelp PA-C Entered By: Lenda Kelp on 11/16/2016 13:57:00 [name] [name] [name] ([id number]) -------------------------------------------------------------------------------- HPI Details Patient Name: [name] [name]. Date of Service: 11/16/2016 8:15 AM Medical Record Number: [id number] Patient Account Number: [id number] Date of Birth/Sex: 11-14-1945 (71 y.o. Male) Treating RN: Phillis Haggis Primary Care Provider: Bluford Main Other Clinician: Referring Provider: Bluford Main Treating Provider/Extender: Linwood Dibbles, Tulsi Crossett Weeks in Treatment: 13 History of Present Illness Location: right knee swelling with skin ulceration Quality: Patient reports experiencing a dull pain to affected area(s). Severity: Patient states wound are getting worse. Duration: Patient has had the wound for <4 weeks prior to presenting for treatment Timing: Pain in wound is Intermittent (comes and goes Context: The wound occurred when the patient had a cast placed over his right lower extremity and with the cast was removed he had a swelling of the knee with problems with his skin Modifying Factors: Other treatment(s) tried include:orthopedics Associated Signs and Symptoms: swelling of both lower extremities with large amount of swelling  of the knee HPI Description: 71 year old gentleman who has been living in an independent living facility for long-term care has had recurrent falls and recently had a large hematoma on the right knee. Known to be on aspirin and Plavix for history of CABG and was seen in the ER at the end of February after a fall. past medical history of arthritis, congestive heart failure, diabetes mellitus, hypertension, status post CABG o2, status post cholecystectomy, hernia repair and kidney surgery. recent workup for the fall showed negative clinical findings in his extremities and it was thought that he was falling due to deconditioning. a x-ray showed a right fibular neck fracture and was placed in a splint. he was asked to be seen by orthopedic surgeon. the patient cannot tell me which orthopedic doctor had seen him and I'm not able to get any notes from Epic, regarding his orthopedic care. The patient says twice during this last week he has had bloody fluid shooting out of his knee and has drained all over his boot and the flow. 08/21/2016 -- he was seen by the nurse practitioner at the facility and and distend she aspirated about 60 mL of hemorrhagic fluid from his right knee. 08/31/2016 -- since I saw the patient last he was admitted to the hospital at [facility] between 08/23/2016 2 08/27/2016 and was treated with a pulmonary embolism. he had EKOS complete, was started on Xarelto, and also treated for chronic diastolic congestive heart failure. he was asked to follow-up with the wound center and continue management locally. 09/07/2016 -- he has an appointment to see his orthopedic surgeon later this week. 09/14/2016 -- he did see the orthopedic service and they have asked him to continue with physical therapy. They discontinued the boot and the x-ray done showed a healed fracture. They said it was okay to use a wound VAC if needed. Addendum -- we got the note from the Orthopedic office  where he was  seen by [facility] orthopedics on [facility] in [place] and was seen by the PA Altamese Cabal. -- The x-ray which was done was reviewed and there is good healing of the fracture and from the orthopedic point of view he has healed well and they have asked him to continue with good control of his diabetes and management by the wound center including using a wound VAC if required. [name], [name] ([id number]) 09/21/2016 -- the patient's wound VAC had been applied 2 days ago, but the black sponge had not been placed laterally under the skin flap and into the area of the right of the knee, where there was a large cavity. On examination, after removal of his wound VAC, there is a large collection of seropurulent fluid and hematoma in the area starting at the lateral knee and going down to his right lateral compartment of the leg. Clinically, this is a huge infected abscess cavity 10/01/2016 -- patient was admitted between 09/21/2016 and 09/23/2016 with cellulitis of the right lower extremity with a large infected hematoma which was drained at the bedside by surgical services. he was treated with Levaquin and continues to be on Xarelto. the patient is back on a wound VAC which has not been functioning due to the large depth of the hematoma and organized clots. He is still on Keflex orally. Addendum : I spoke to his surgeon Dr. Satira Mccallum and we discussed options for further draining this large hematoma which I suspect will get infected sooner than later. I expressed the fact that the wound VAC does not seem to be functioning well and competent enough to drain this large cavity. He will call for him in the office. [id number] 2018 -- the patient has hypotension in the sitting position and is a bit lethargic as compared to his normal self. I spoke personally to his nurse at the nursing homes, Ms Maralyn Sago and discussed his management including the fact that he had to have a surgical consultation for the  right lower leg, with Dr. Satira Mccallum. Prior to the patient's discharge from the wound center he continues to have hypotension and hence I'm going to talk to the ER physician and have him evaluated in the ER. 10/15/2016 -- the patient was admitted to the hospital between 10/08/2016 and 10/12/2016. He was treated for hypokalemia, hyponatremia, renal insufficiency, sepsis and possible abscess of the right lower leg. he was seen by surgical services and the fluid collection was drained with a Yankauer suction from the knee wound and the opinion of the surgeons was that he would not need any surgical intervention and continue with local wound care. I had personally communicated with Dr. Satira Mccallum regarding this. the patient is still on Xarelto. During the admission he was also seen by Dr. Sampson Goon for infectious disease consult and he recommended continue vancomycin for MRSA and change meropenem to ceftriaxone for this Proteus. He recommended a four-week course of antibiotic therapy and follow-up back with Dr. Sampson Goon in 4 weeks. before his discharge since there was clinical improvement Dr. Sampson Goon converted the treatment to oral clindamycin and oral Keflex for a four-week course of antibiotic therapy. He was to be monitored for C. difficile. 10/29/2016 -- he missed his appointment with Dr. Earlene Plater at the surgical office. He continues to be on oral antibiotics as per Dr. Sampson Goon. 11/16/16 patient's wound on the right knee continues to trouble him. He tells me that the nursing facility has not changed this dressing  in almost a week and his wound is doing somewhat worse today in regard to undermining compared to last week's evaluation. He continues to be frustrated with this. Unfortunately he has tried to switch facilities but was not able to get into any other so far. Electronic Signature(s) Signed: 11/16/2016 5:09:57 PM By: Lenda Kelp PA-C Entered By: Lenda Kelp on 11/16/2016  13:58:33 Barajas, [name] [name] ([id number]) -------------------------------------------------------------------------------- Physical Exam Details Patient Name: [name] Mylar T. Date of Service: 11/16/2016 8:15 AM Medical Record Number: [id number] Patient Account Number: [id number] Date of Birth/Sex: Jul 17, 1945 (71 y.o. Male) Treating RN: Phillis Haggis Primary Care Provider: Bluford Main Other Clinician: Referring Provider: Bluford Main Treating Provider/Extender: Linwood Dibbles, Khamia Stambaugh Weeks in Treatment: 13 Constitutional Obese and well-hydrated in no acute distress. Respiratory normal breathing without difficulty. clear to auscultation bilaterally. Cardiovascular regular rate and rhythm with normal S1, S2. Psychiatric this patient is able to make decisions and demonstrates good insight into disease process. Alert and Oriented x 3. pleasant and cooperative. Notes No slough noted on evaluation today though patient right knee wound does show additional undermining compared to last week's evaluation. Electronic Signature(s) Signed: 11/16/2016 5:09:57 PM By: Lenda Kelp PA-C Entered By: Lenda Kelp on 11/16/2016 13:59:10 Bonser, [name] [name] ([id number]) -------------------------------------------------------------------------------- Physician Orders Details Patient Name: [name] Mylar T. Date of Service: 11/16/2016 8:15 AM Medical Record Number: [id number] Patient Account Number: [id number] Date of Birth/Sex: 1945/11/03 (71 y.o. 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 o Cleanse wound with mild soap and water Anesthetic Wound #1 Right,Anterior Knee o Topical Lidocaine 4% cream applied to wound bed prior to debridement - Only in the Wound Clinic Primary Wound Dressing Wound #1 Right,Anterior Knee o Aquacel Ag - silver alginate rope to the depth of the wound at 6 o'clock pack lightly in wound Secondary Dressing Wound #1 Right,Anterior Knee o ABD pad o Dry Gauze o Conform/Kerlix o Other - tape Dressing Change Frequency Wound #1 Right,Anterior Knee o Change dressing every day. [name] [name] ([id number]) o Other: - as needed Follow-up Appointments Wound #1 Right,Anterior Knee o Return Appointment in 1 week. Edema Control Wound #1 Right,Anterior Knee o Elevate legs to the level of the heart and pump ankles as often as possible Additional Orders / Instructions Wound #1 Right,Anterior Knee o Increase protein intake. o Activity as tolerated Medications-please add to medication list. Wound #1 Right,Anterior Knee o Other: - Vitamin C, Zinc, MVI Notes I'm going to recommend that we continue with Current wound care measures although obviously this dressing does need to be changed daily. I did reiterate that on the note sent back to the facility following today's evaluation. We will see him for follow-up visit in one week if anything worsens in the interim nursing staff can contact us for additional recommendations. Electronic Signature(s) Signed: 11/16/2016 5:09:57 PM By: Lenda Kelp PA-C Entered By: Linwood Dibbles,  Shiv Shuey on 11/16/2016 13:59:51 Vida, [name] [name]  ([id number]) -------------------------------------------------------------------------------- Problem List Details Patient Name: [name] [name] T. Date of Service: 11/16/2016 8:15 AM Medical Record Number: [id number] Patient Account Number: [id number] Date of Birth/Sex: [birth year] (71 y.o. 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, right knee 08/14/2016 Yes L97.812 Non-pressure chronic ulcer of other part of right lower leg 08/14/2016 Yes with fat layer exposed L89.892 Pressure ulcer of other site, stage 2 08/14/2016 Yes M70.861 Other soft tissue disorders related to use, overuse and 08/14/2016 Yes pressure, right lower leg M71.061 Abscess of bursa, right knee 09/21/2016 Yes Inactive Problems Resolved Problems Electronic Signature(s) Signed: 11/16/2016 5:09:57 PM By: Lenda Kelp PA-C Entered By: Lenda Kelp on 11/16/2016 11:50:27 [place], [name] [name] ([id number]) -------------------------------------------------------------------------------- Progress Note Details Patient Name: [name] [name]. Date of Service: 11/16/2016 8:15 AM Medical Record Number: [id number] Patient Account Number: [id number] Date of Birth/Sex: July 07, 1945 (71 y.o. Male) Treating RN: Phillis Haggis Primary Care Provider: Bluford Main Other Clinician: Referring Provider: Bluford Main Treating Provider/Extender: Linwood Dibbles, Kaeley Vinje Weeks in Treatment: 13 Subjective Chief Complaint Information obtained from Patient Patient is at the clinic for treatment of an open pressure ulcer to the area of the right knee History of Present Illness (HPI) The following HPI elements were  documented for the patient's wound: Location: right knee swelling with skin ulceration Quality: Patient reports experiencing a dull pain to affected area(s). Severity: Patient states wound are getting worse. Duration: Patient has had the wound for Timing: Pain in wound is Intermittent (comes and goes Context: The wound occurred when the patient had a cast placed over his right lower extremity and with the cast was removed he had a swelling of the knee with problems with his skin Modifying Factors: Other treatment(s) tried include:orthopedics Associated Signs and Symptoms: swelling of both lower extremities with large amount of swelling of the knee 71 year old gentleman who has been living in an independent living facility for long-term care has had recurrent falls and recently had a large hematoma on the right knee. Known to be on aspirin and Plavix for history of CABG and was seen in the ER at the end of February after a fall. past medical history of arthritis, congestive heart failure, diabetes mellitus, hypertension, status post CABG fo2, status post cholecystectomy, hernia repair and kidney surgery. recent workup for the fall showed negative clinical findings in his extremities and it was thought that he was falling due to deconditioning. a x-ray showed a right fibular neck fracture and was placed in a splint. he was asked to be seen by orthopedic surgeon. the patient cannot tell me which orthopedic doctor had seen him and I'm not able to get any notes from Epic, regarding his orthopedic care. The patient says twice during this last week he has had bloody fluid shooting out of his knee and has drained all over his boot and the flow. 08/21/2016 -- he was seen by the nurse practitioner at the facility and and distend she aspirated about 60 mL of hemorrhagic fluid from his right knee. 08/31/2016 -- since I saw the patient last he was admitted to the hospital at [facility] between 08/23/2016  2 08/27/2016 and was treated with a pulmonary embolism. he had EKOS complete, was started on Xarelto,  and also treated for chronic diastolic congestive heart failure. he was asked to follow-up with the wound center and continue management locally. 09/07/2016 -- he has an appointment to see his orthopedic surgeon later this week. 09/14/2016 -- he did see the orthopedic service and they have asked him to continue with physical therapy. They discontinued the boot and the x-ray done showed a healed fracture. They said it was okay to use a Sao, Aaiden T. ([id number]) wound VAC if needed. Addendum -- we got the note from the Orthopedic office where he was seen by [facility] orthopedics on [facility] in [place] and was seen by the PA Altamese Cabal. -- The x-ray which was done was reviewed and there is good healing of the fracture and from the orthopedic point of view he has healed well and they have asked him to continue with good control of his diabetes and management by the wound center including using a wound VAC if required. 09/21/2016 -- the patient's wound VAC had been applied 2 days ago, but the black sponge had not been placed laterally under the skin flap and into the area of the right of the knee, where there was a large cavity. On examination, after removal of his wound VAC, there is a large collection of seropurulent fluid and hematoma in the area starting at the lateral knee and going down to his right lateral compartment of the leg. Clinically, this is a huge infected abscess cavity 10/01/2016 -- patient was admitted between 09/21/2016 and 09/23/2016 with cellulitis of the right lower extremity with a large infected hematoma which was drained at the bedside by surgical services. he was treated with Levaquin and continues to be on Xarelto. the patient is back on a wound VAC which has not been functioning due to the large depth of the hematoma and organized clots. He is still on  Keflex orally. Addendum : I spoke to his surgeon Dr. Satira Mccallum and we discussed options for further draining this large hematoma which I suspect will get infected sooner than later. I expressed the fact that the wound VAC does not seem to be functioning well and competent enough to drain this large cavity. He will call for him in the office. WUJ81 2018 -- the patient has hypotension in the sitting position and is a bit lethargic as compared to his normal self. I spoke personally to his nurse at the nursing homes, Ms Maralyn Sago and discussed his management including the fact that he had to have a surgical consultation for the right lower leg, with Dr. Satira Mccallum. Prior to the patient's discharge from the wound center he continues to have hypotension and hence I'm going to talk to the ER physician and have him evaluated in the ER. 10/15/2016 -- the patient was admitted to the hospital between 10/08/2016 and 10/12/2016. He was treated for hypokalemia, hyponatremia, renal insufficiency, sepsis and possible abscess of the right lower leg. he was seen by surgical services and the fluid collection was drained with a Yankauer suction from the knee wound and the opinion of the surgeons was that he would not need any surgical intervention and continue with local wound care. I had personally communicated with Dr. Satira Mccallum regarding this. the patient is still on Xarelto. During the admission he was also seen by Dr. Sampson Goon for infectious disease consult and he recommended continue vancomycin for MRSA and change meropenem to ceftriaxone for this Proteus. He recommended a four-week course of antibiotic therapy and follow-up back  with Dr. Sampson Goon in 4 weeks. before his discharge since there was clinical improvement Dr. Sampson Goon converted the treatment to oral clindamycin and oral Keflex for a four-week course of antibiotic therapy. He was to be monitored for C. difficile. 10/29/2016 -- he missed his  appointment with Dr. Earlene Plater at the surgical office. He continues to be on oral antibiotics as per Dr. Sampson Goon. 11/16/16 patient's wound on the right knee continues to trouble him. He tells me that the nursing facility has not changed this dressing in almost a week and his wound is doing somewhat worse today in regard to undermining compared to last week's evaluation. He continues to be frustrated with this. Unfortunately he has tried to switch facilities but was not able to get into any other so far. MELDON, HANZLIK ([id number]) Objective Constitutional Obese and well-hydrated in no acute distress. Vitals Time Taken: 8:17 AM, Height: 67 in, Weight: 357 lbs, BMI: 55.9, Temperature: 97.5 F, Pulse: 76 bpm, Respiratory Rate: 18 breaths/min, Blood Pressure: 132/63 mmHg. Respiratory normal breathing without difficulty. clear to auscultation bilaterally. Cardiovascular regular rate and rhythm with normal S1, S2. Psychiatric this patient is able to make decisions and demonstrates good insight into disease process. Alert and Oriented x 3. pleasant and cooperative. General Notes: No slough noted on evaluation today though patient right knee wound does show additional undermining compared to last week's evaluation. Integumentary (Hair, Skin) Wound #1 status is Open. Original cause of wound was Pressure Injury. The wound is located on the Right,Anterior Knee. The wound measures 0.6cm length x 1.6cm width x 0.3cm depth; 0.754cm^2 area and 0.226cm^3 volume. There is Fat Layer (Subcutaneous Tissue) Exposed exposed. Tunneling has been noted at 6:00 with a maximum distance of 5.5cm. Undermining begins at 7:00 and ends at 5:00 with a maximum distance of 5.5cm. There is a large amount of serosanguineous drainage noted. The wound margin is flat and intact. There is large (67-100%) red granulation within the wound bed. There is no necrotic tissue within the wound bed. The periwound skin appearance  exhibited: Erythema. The periwound skin appearance did not exhibit: Callus, Crepitus, Excoriation, Induration, Rash, Scarring, Dry/Scaly, Maceration, Atrophie Blanche, Cyanosis, Ecchymosis, Hemosiderin Staining, Mottled, Pallor, Rubor. The surrounding wound skin color is noted with erythema which is circumferential. Periwound temperature was noted as No Abnormality. The periwound has tenderness on palpation. 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 Cleanse wound with mild soap and water Anesthetic: Wound #1 Right,Anterior Knee: Topical Lidocaine 4% cream applied to wound bed prior to debridement - Only in the Wound Clinic Primary Wound Dressing: Wound #1 Right,Anterior Knee: Aquacel Ag - silver alginate rope to the depth of the wound at 6 o'clock pack lightly in wound Secondary Dressing: Wound #1 Right,Anterior Knee: ABD pad Dry Gauze Conform/Kerlix Other - tape Dressing Change Frequency: Wound #1 Right,Anterior Knee: Change dressing every day. Other: - as needed Follow-up Appointments: Wound #1 Right,Anterior Knee: Return Appointment in 1 week. Edema Control: Wound #1 Right,Anterior Knee: Elevate legs to the level of the heart and pump ankles as often as possible  Additional Orders / Instructions: Wound #1 Right,Anterior Knee: Increase protein intake. Activity as tolerated Medications-please add to medication list.: Wound #1 Right,Anterior Knee: Other: - Vitamin C, Zinc, MVI General Notes: I'm  going to recommend that we continue with Current wound care measures although [name] [name] T. ([id number]) obviously this dressing does need to be changed daily. I did reiterate that on the note sent back to the facility following today's evaluation. We will see him for follow-up visit in one week if anything worsens in the interim nursing staff can contact us for additional recommendations. Electronic Signature(s) Signed: 11/16/2016 5:09:57 PM By: Lenda Kelp PA-C Entered By: Lenda Kelp on 11/16/2016 14:00:01 [place], [name] [name] ([id number]) -------------------------------------------------------------------------------- SuperBill Details Patient Name: [name] Mylar T. Date of Service: 11/16/2016 Medical Record Number: [id number] Patient Account Number: [id number] Date of Birth/Sex: 01/13/1946 (71 y.o. 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 L89.892 Pressure ulcer of other site, stage 2 M70.861 Other soft tissue disorders related to use, overuse and pressure, right lower leg M71.061 Abscess of bursa, right knee Facility Procedures CPT4 Code: 08657846 Description: 99213 - WOUND CARE VISIT-LEV 3 EST PT Modifier: Quantity: 1 Physician Procedures CPT4: Description Modifier Quantity Code 9629528 99213 - WC PHYS LEVEL 3 - EST PT 1 ICD-10 Description Diagnosis 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

## 2016-11-17 NOTE — Progress Notes (Signed)
[name] ([id number]) Visit Report for 11/16/2016 Arrival Information Details Patient Name: [name], [name]. Date of Service: 11/16/2016 8:15 AM Medical Record Number: [id number] Patient Account Number: [id number] Date of Birth/Sex: 08-Dec-1945 (71 y.o. Male) Treating RN: Ahmed Prima Primary Care Dijuan Sleeth: [name] Dupre Other Clinician: Referring Casimira Sutphin: [name] Dupre Treating Joren Rehm/Extender: Melburn Hake, HOYT Weeks in Treatment: 13 Visit Information History Since Last Visit All ordered tests and consults were completed: No Patient Arrived: Wheel Chair Added or deleted any medications: No Arrival Time: 08:16 Any new allergies or adverse reactions: No Accompanied By: self Had a fall or experienced change in No Transfer Assistance: EasyPivot activities of daily living that may affect Patient Lift risk of falls: Patient Identification Verified: Yes Signs or symptoms of abuse/neglect since last No Secondary Verification Process Yes visito Completed: Hospitalized since last visit: No Patient Requires Transmission- No Has Dressing in Place as Prescribed: Yes Based Precautions: Pain Present Now: No Patient Has Alerts: No Electronic Signature(s) Signed: 11/16/2016 4:29:00 PM By: Alric Quan Entered By: Alric Quan on 11/16/2016 08:17:06 Morrone, [name] ([id number]) -------------------------------------------------------------------------------- Clinic Level of Care Assessment Details Patient Name: [name]. Date of Service: 11/16/2016 8:15 AM Medical Record Number: [id number] Patient Account Number: [id number] Date of Birth/Sex: 25-Dec-1945 (71 y.o. Male) Treating RN: Ahmed Prima Primary Care Kaylynn Chamblin: [name] Dupre Other Clinician: Referring Mamie Hundertmark: [name] Dupre Treating Ayona Yniguez/Extender: Melburn Hake, HOYT Weeks in Treatment: 13 Clinic Level of Care Assessment Items TOOL 4 Quantity Score X - Use when only an EandM is  performed on FOLLOW-UP visit 1 0 ASSESSMENTS - Nursing Assessment / Reassessment X - Reassessment of Co-morbidities (includes updates in patient status) 1 10 X - Reassessment of Adherence to Treatment Plan 1 5 ASSESSMENTS - Wound and Skin Assessment / Reassessment X - Simple Wound Assessment / Reassessment - one wound 1 5 []  - Complex Wound Assessment / Reassessment - multiple wounds 0 []  - Dermatologic / Skin Assessment (not related to wound area) 0 ASSESSMENTS - Focused Assessment []  - Circumferential Edema Measurements - multi extremities 0 []  - Nutritional Assessment / Counseling / Intervention 0 []  - Lower Extremity Assessment (monofilament, tuning fork, pulses) 0 []  - Peripheral Arterial Disease Assessment (using hand held doppler) 0 ASSESSMENTS - Ostomy and/or Continence Assessment and Care []  - Incontinence Assessment and Management 0 []  - Ostomy Care Assessment and Management (repouching, etc.) 0 PROCESS - Coordination of Care []  - Simple Patient / Family Education for ongoing care 0 X - Complex (extensive) Patient / Family Education for ongoing care 1 20 X - Staff obtains Programmer, systems, Records, Test Results / Process Orders 1 10 X - Staff telephones HHA, Nursing Homes / Clarify orders / etc 1 10 []  - Routine Transfer to another Facility (non-emergent condition) 0 Beharry, Dayshaun T. ([id number]) []  - Routine Hospital Admission (non-emergent condition) 0 []  - New Admissions / Biomedical engineer / Ordering NPWT, Apligraf, etc. 0 []  - Emergency Hospital Admission (emergent condition) 0 X - Simple Discharge Coordination 1 10 []  - Complex (extensive) Discharge Coordination 0 PROCESS - Special Needs []  - Pediatric / Minor Patient Management 0 []  - Isolation Patient Management 0 []  - Hearing / Language / Visual special needs 0 []  - Assessment of Community assistance (transportation, D/C planning, etc.) 0 []  - Additional assistance / Altered mentation 0 []  - Support Surface(s)  Assessment (bed, cushion, seat, etc.) 0 INTERVENTIONS - Wound Cleansing / Measurement X - Simple Wound Cleansing - one wound 1 5 []  - Complex Wound Cleansing - multiple  wounds 0 X - Wound Imaging (photographs - any number of wounds) 1 5 []  - Wound Tracing (instead of photographs) 0 X - Simple Wound Measurement - one wound 1 5 []  - Complex Wound Measurement - multiple wounds 0 INTERVENTIONS - Wound Dressings []  - Small Wound Dressing one or multiple wounds 0 X - Medium Wound Dressing one or multiple wounds 1 15 []  - Large Wound Dressing one or multiple wounds 0 X - Application of Medications - topical 1 5 []  - Application of Medications - injection 0 INTERVENTIONS - Miscellaneous []  - External ear exam 0 Dickie, Caleb T. ([id number]) []  - Specimen Collection (cultures, biopsies, blood, body fluids, etc.) 0 []  - Specimen(s) / Culture(s) sent or taken to Lab for analysis 0 []  - Patient Transfer (multiple staff / Harrel Lemon Lift / Similar devices) 0 []  - Simple Staple / Suture removal (25 or less) 0 []  - Complex Staple / Suture removal (26 or more) 0 []  - Hypo / Hyperglycemic Management (close monitor of Blood Glucose) 0 []  - Ankle / Brachial Index (ABI) - do not check if billed separately 0 X - Vital Signs 1 5 Has the patient been seen at the hospital within the last three years: Yes Total Score: 110 Level Of Care: New/Established - Level 3 Electronic Signature(s) Signed: 11/16/2016 4:29:00 PM By: Alric Quan Entered By: Alric Quan on 11/16/2016 13:05:51 Mi, [name] ([id number]) -------------------------------------------------------------------------------- Encounter Discharge Information Details Patient Name: [name] Gathers T. Date of Service: 11/16/2016 8:15 AM Medical Record Number: [id number] Patient Account Number: [id number] Date of Birth/Sex: 10/06/1945 (71 y.o. Male) Treating RN: Ahmed Prima Primary Care Oran Dillenburg: [name] Dupre Other Clinician: Referring  Tanav Orsak: [name] Dupre Treating Teya Otterson/Extender: Melburn Hake, HOYT Weeks in Treatment: 73 Encounter Discharge Information Items Discharge Pain Level: 0 Discharge Condition: Stable Ambulatory Status: Wheelchair Discharge Destination: Nursing Home Transportation: Other Accompanied By: self Schedule Follow-up Appointment: Yes Medication Reconciliation completed and provided to Patient/Care No Breindel Collier: Provided on Clinical Summary of Care: 11/16/2016 Form Type Recipient Paper Patient JE Electronic Signature(s) Signed: 11/16/2016 8:49:47 AM By: Ruthine Dose Entered By: Ruthine Dose on 11/16/2016 08:49:47 Cournoyer, [name] ([id number]) -------------------------------------------------------------------------------- Lower Extremity Assessment Details Patient Name: [name] Gathers T. Date of Service: 11/16/2016 8:15 AM Medical Record Number: [id number] Patient Account Number: [id number] Date of Birth/Sex: 02-27-1946 (71 y.o. Male) Treating RN: Ahmed Prima Primary Care Terren Haberle: [name] Dupre Other Clinician: Referring Jathen Sudano: [name] Dupre Treating Eugean Arnott/Extender: Melburn Hake, HOYT Weeks in Treatment: 13 Vascular Assessment Pulses: Dorsalis Pedis Palpable: [Right:Yes] Posterior Tibial Extremity colors, hair growth, and conditions: Extremity Color: [Right:Hyperpigmented] Temperature of Extremity: [Right:Warm] Capillary Refill: [Right:> 3 seconds] Electronic Signature(s) Signed: 11/16/2016 4:29:00 PM By: Alric Quan Entered By: Alric Quan on 11/16/2016 08:33:51 Loth, [name] ([id number]) -------------------------------------------------------------------------------- Multi Wound Chart Details Patient Name: [name] Gathers T. Date of Service: 11/16/2016 8:15 AM Medical Record Number: [id number] Patient Account Number: [id number] Date of Birth/Sex: 11-26-1945 (71 y.o. Male) Treating RN: Ahmed Prima Primary Care Jayleen Scaglione: [name] Dupre Other  Clinician: Referring Jamaris Theard: [name] Dupre Treating Sandeep Radell/Extender: Melburn Hake, HOYT Weeks in Treatment: 13 Vital Signs Height(in): 67 Pulse(bpm): 76 Weight(lbs): 357 Blood Pressure 132/63 (mmHg): Body Mass Index(BMI): 56 Temperature(F): 97.5 Respiratory Rate 18 (breaths/min): Photos: [1:No Photos] [N/A:N/A] Wound Location: [1:Right Knee - Anterior] [N/A:N/A] Wounding Event: [1:Pressure Injury] [N/A:N/A] Primary Etiology: [1:Diabetic Wound/Ulcer of the Lower Extremity] [N/A:N/A] Comorbid History: [1:Anemia, Asthma, Arrhythmia, Congestive Heart Failure, Coronary Artery Disease, Hypertension, Type II Diabetes, Osteoarthritis] [N/A:N/A] Date Acquired: [1:07/23/2016] [N/A:N/A] Weeks of Treatment: [1:13] [N/A:N/A] Wound Status: [  1:Open] [N/A:N/A] Measurements L x W x D 0.6x1.6x0.3 [N/A:N/A] (cm) Area (cm) : [1:0.754] [N/A:N/A] Volume (cm) : [1:0.226] [N/A:N/A] % Reduction in Area: [1:90.40%] [N/A:N/A] % Reduction in Volume: 71.20% [N/A:N/A] Position 1 (o'clock): 6 Maximum Distance 1 5.5 (cm): Starting Position 1 [1:7] (o'clock): Ending Position 1 [1:5] (o'clock): Maximum Distance 1 5.5 (cm): Tunneling: [1:Yes] [N/A:N/A] Undermining: Yes N/A N/A Classification: Grade 1 N/A N/A Exudate Amount: Large N/A N/A Exudate Type: Serosanguineous N/A N/A Exudate Color: red, brown N/A N/A Wound Margin: Flat and Intact N/A N/A Granulation Amount: Large (67-100%) N/A N/A Granulation Quality: Red N/A N/A Necrotic Amount: None Present (0%) N/A N/A Exposed Structures: Fat Layer (Subcutaneous N/A N/A Tissue) Exposed: Yes Fascia: No Tendon: No Muscle: No Joint: No Bone: No Epithelialization: None N/A N/A Periwound Skin Texture: Excoriation: No N/A N/A Induration: No Callus: No Crepitus: No Rash: No Scarring: No Periwound Skin Maceration: No N/A N/A Moisture: Dry/Scaly: No Periwound Skin Color: Erythema: Yes N/A N/A Atrophie Blanche: No Cyanosis: No Ecchymosis:  No Hemosiderin Staining: No Mottled: No Pallor: No Rubor: No Erythema Location: Circumferential N/A N/A Temperature: No Abnormality N/A N/A Tenderness on Yes N/A N/A Palpation: Wound Preparation: Ulcer Cleansing: N/A N/A Rinsed/Irrigated with Saline Topical Anesthetic Applied: Other: Lidocaine 4% Treatment Notes Electronic Signature(s) Signed: 11/16/2016 4:29:00 PM By: Marvis Repress ([id number]) Entered By: Alric Quan on 11/16/2016 08:29:36 Manzo, [name] ([id number]) -------------------------------------------------------------------------------- [facility] Details Patient Name: [name] Gathers T. Date of Service: 11/16/2016 8:15 AM Medical Record Number: [id number] Patient Account Number: [id number] Date of Birth/Sex: 12-11-1945 (71 y.o. Male) Treating RN: Ahmed Prima Primary Care Azayla Polo: [name] Dupre Other Clinician: Referring Kathyleen Radice: [name] Dupre Treating Jaelee Laughter/Extender: Melburn Hake, HOYT Weeks in Treatment: 13 Active Inactive ` Orientation to the Wound Care Program Nursing Diagnoses: Knowledge deficit related to the wound healing center program Goals: Patient/caregiver will verbalize understanding of the [place] Program Date Initiated: 08/14/2016 Target Resolution Date: 12/14/2016 Goal Status: Active Interventions: Provide education on orientation to the wound center Notes: ` Pressure Nursing Diagnoses: Knowledge deficit related to causes and risk factors for pressure ulcer development Knowledge deficit related to management of pressures ulcers Potential for impaired tissue integrity related to pressure, friction, moisture, and shear Goals: Patient will remain free from development of additional pressure ulcers Date Initiated: 08/14/2016 Target Resolution Date: 12/14/2016 Goal Status: Active Patient will remain free of pressure ulcers Date Initiated: 08/14/2016 Target Resolution Date:  12/14/2016 Goal Status: Active Patient/caregiver will verbalize risk factors for pressure ulcer development Date Initiated: 08/14/2016 Target Resolution Date: 12/14/2016 Goal Status: Active Patient/caregiver will verbalize understanding of pressure ulcer management Date Initiated: 08/14/2016 Target Resolution Date: 12/14/2016 Goal Status: Active DALE, RIBEIRO ([id number]) Interventions: Assess: immobility, friction, shearing, incontinence upon admission and as needed Assess offloading mechanisms upon admission and as needed Assess potential for pressure ulcer upon admission and as needed Provide education on pressure ulcers Treatment Activities: Patient referred for seating evaluation to ensure proper offloading : 08/14/2016 Pressure reduction/relief device ordered : 08/14/2016 Notes: ` Wound/Skin Impairment Nursing Diagnoses: Impaired tissue integrity Knowledge deficit related to ulceration/compromised skin integrity Goals: Patient/caregiver will verbalize understanding of skin care regimen Date Initiated: 08/14/2016 Target Resolution Date: 12/14/2016 Goal Status: Active Ulcer/skin breakdown will have a volume reduction of 30% by week 4 Date Initiated: 08/14/2016 Target Resolution Date: 12/14/2016 Goal Status: Active Ulcer/skin breakdown will have a volume reduction of 50% by week 8 Date Initiated: 08/14/2016 Target Resolution Date: 12/14/2016 Goal Status: Active Ulcer/skin breakdown will have a  volume reduction of 80% by week 12 Date Initiated: 08/14/2016 Target Resolution Date: 12/14/2016 Goal Status: Active Ulcer/skin breakdown will heal within 14 weeks Date Initiated: 08/14/2016 Target Resolution Date: 12/14/2016 Goal Status: Active Interventions: Assess patient/caregiver ability to obtain necessary supplies Assess patient/caregiver ability to perform ulcer/skin care regimen upon admission and as needed Assess ulceration(s) every visit Provide education on ulcer and skin  care Treatment Activities: Referred to DME Mayer Vondrak for dressing supplies : 08/14/2016 Skin care regimen initiated : 08/14/2016 MAKARIOS, MADLOCK ([id number]) Topical wound management initiated : 08/14/2016 Notes: Electronic Signature(s) Signed: 11/16/2016 4:29:00 PM By: Alric Quan Entered By: Alric Quan on 11/16/2016 08:29:27 Bonneau, [name] ([id number]) -------------------------------------------------------------------------------- Pain Assessment Details Patient Name: [name] Gathers T. Date of Service: 11/16/2016 8:15 AM Medical Record Number: [id number] Patient Account Number: [id number] Date of Birth/Sex: [birth year] (71 y.o. Male) Treating RN: Ahmed Prima Primary Care Nakkia Mackiewicz: [name] Dupre Other Clinician: Referring Zian Mohamed: [name] Dupre Treating Cristabel Bicknell/Extender: Melburn Hake, HOYT Weeks in Treatment: 13 Active Problems Location of Pain Severity and Description of Pain Patient Has Paino No Site Locations With Dressing Change: No Pain Management and Medication Current Pain Management: Electronic Signature(s) Signed: 11/16/2016 4:29:00 PM By: Alric Quan Entered By: Alric Quan on 11/16/2016 08:17:14 Finlay, [name] ([id number]) -------------------------------------------------------------------------------- Patient/Caregiver Education Details Patient Name: [name]. Date of Service: 11/16/2016 8:15 AM Medical Record Number: [id number] Patient Account Number: [id number] Date of Birth/Gender: 02-23-1946 (71 y.o. Male) Treating RN: Ahmed Prima Primary Care Physician: [name] Dupre Other Clinician: Referring Physician: [name] Dupre Treating Physician/Extender: Sharalyn Ink in Treatment: 13 Education Assessment Education Provided To: Patient Education Topics Provided Wound/Skin Impairment: Handouts: Other: change dressing as ordered Methods: Demonstration, Explain/Verbal Responses: State content  correctly Electronic Signature(s) Signed: 11/16/2016 4:29:00 PM By: Alric Quan Entered By: Alric Quan on 11/16/2016 08:35:43 Aday, [name] ([id number]) -------------------------------------------------------------------------------- Wound Assessment Details Patient Name: [name] Gathers T. Date of Service: 11/16/2016 8:15 AM Medical Record Number: [id number] Patient Account Number: [id number] Date of Birth/Sex: [birth year] (71 y.o. Male) Treating RN: Ahmed Prima Primary Care Jru Pense: [name] Dupre Other Clinician: Referring Brian Zeitlin: [name] Dupre Treating Devaughn Savant/Extender: Melburn Hake, HOYT Weeks in Treatment: 13 Wound Status Wound Number: 1 Primary Diabetic Wound/Ulcer of the Lower Etiology: Extremity Wound Location: Right Knee - Anterior Wound Open Wounding Event: Pressure Injury Status: Date Acquired: 07/23/2016 Comorbid Anemia, Asthma, Arrhythmia, Weeks Of Treatment: 13 History: Congestive Heart Failure, Coronary Clustered Wound: No Artery Disease, Hypertension, Type II Diabetes, Osteoarthritis Photos Photo Uploaded By: Alric Quan on 11/16/2016 10:13:08 Wound Measurements Length: (cm) 0.6 % Reduction in Width: (cm) 1.6 % Reduction in Depth: (cm) 0.3 Epithelializati Area: (cm) 0.754 Tunneling: Volume: (cm) 0.226 Position (o Maximum Dist Area: 90.4% Volume: 71.2% on: None Yes 'clock): 6 ance: (cm) 5.5 Undermining: Yes Starting Position (o'clock): 7 Ending Position (o'clock): 5 Maximum Distance: (cm) 5.5 Wound Description Classification: Grade 1 Foul Odor Afte Wound Margin: Flat and Intact Slough/Fibrino Exudate Amount: Large Voight, Aeon T. ([id number]) r Cleansing: No No Exudate Type: Serosanguineous Exudate Color: red, brown Wound Bed Granulation Amount: Large (67-100%) Exposed Structure Granulation Quality: Red Fascia Exposed: No Necrotic Amount: None Present (0%) Fat Layer (Subcutaneous Tissue) Exposed: Yes Tendon  Exposed: No Muscle Exposed: No Joint Exposed: No Bone Exposed: No Periwound Skin Texture Texture Color No Abnormalities Noted: No No Abnormalities Noted: No Callus: No Atrophie Blanche: No Crepitus: No Cyanosis: No Excoriation: No Ecchymosis: No Induration: No Erythema: Yes Rash: No Erythema Location: Circumferential Scarring: No Hemosiderin Staining: No Mottled: No Moisture Pallor: No  No Abnormalities Noted: No Rubor: No Dry / Scaly: No Maceration: No Temperature / Pain Temperature: No Abnormality Tenderness on Palpation: Yes Wound Preparation Ulcer Cleansing: Rinsed/Irrigated with Saline Topical Anesthetic Applied: Other: Lidocaine 4%, Treatment Notes Wound #1 (Right, Anterior Knee) 1. Cleansed with: Clean wound with Normal Saline 2. Anesthetic Topical Lidocaine 4% cream to wound bed prior to debridement 4. Dressing Applied: Aquacel Ag 5. Secondary Dressing Applied ABD Pad Dry Gauze Kerlix/Conform 7. Secured with Tape MAKENA, MCGRADY ([id number]) Electronic Signature(s) Signed: 11/16/2016 4:29:00 PM By: Alric Quan Entered By: Alric Quan on 11/16/2016 08:26:51 Gail, [name] ([id number]) -------------------------------------------------------------------------------- Vitals Details Patient Name: [name] Gathers T. Date of Service: 11/16/2016 8:15 AM Medical Record Number: [id number] Patient Account Number: [id number] Date of Birth/Sex: 09/22/1945 (71 y.o. Male) Treating RN: Ahmed Prima Primary Care Maxfield Gildersleeve: [name] Dupre Other Clinician: Referring Christion Leonhard: [name] Dupre Treating Camreigh Michie/Extender: Melburn Hake, HOYT Weeks in Treatment: 13 Vital Signs Time Taken: 08:17 Temperature (F): 97.5 Height (in): 67 Pulse (bpm): 76 Weight (lbs): 357 Respiratory Rate (breaths/min): 18 Body Mass Index (BMI): 55.9 Blood Pressure (mmHg): 132/63 Reference Range: 80 - 120 mg / dl Electronic Signature(s) Signed: 11/16/2016 4:29:00 PM By:  Alric Quan Entered By: Alric Quan on 11/16/2016 08:19:14

## 2016-11-21 ENCOUNTER — Encounter: Payer: Self-pay | Admitting: Emergency Medicine

## 2016-11-21 ENCOUNTER — Emergency Department: Payer: Medicare Other

## 2016-11-21 ENCOUNTER — Observation Stay
Admission: EM | Admit: 2016-11-21 | Discharge: 2016-11-22 | Disposition: A | Discharge status: Home / Self Care | Payer: Medicare Other | Attending: Internal Medicine | Admitting: Internal Medicine

## 2016-11-21 DIAGNOSIS — Z881 Allergy status to other antibiotic agents status: Secondary | ICD-10-CM | POA: Insufficient documentation

## 2016-11-21 DIAGNOSIS — Z9049 Acquired absence of other specified parts of digestive tract: Secondary | ICD-10-CM | POA: Insufficient documentation

## 2016-11-21 DIAGNOSIS — I5032 Chronic diastolic (congestive) heart failure: Secondary | ICD-10-CM | POA: Insufficient documentation

## 2016-11-21 DIAGNOSIS — T148XXD Other injury of unspecified body region, subsequent encounter: Secondary | ICD-10-CM | POA: Insufficient documentation

## 2016-11-21 DIAGNOSIS — E876 Hypokalemia: Secondary | ICD-10-CM | POA: Diagnosis not present

## 2016-11-21 DIAGNOSIS — G4733 Obstructive sleep apnea (adult) (pediatric): Secondary | ICD-10-CM | POA: Insufficient documentation

## 2016-11-21 DIAGNOSIS — E669 Obesity, unspecified: Secondary | ICD-10-CM | POA: Diagnosis not present

## 2016-11-21 DIAGNOSIS — Z8673 Personal history of transient ischemic attack (TIA), and cerebral infarction without residual deficits: Secondary | ICD-10-CM | POA: Diagnosis not present

## 2016-11-21 DIAGNOSIS — Z6841 Body Mass Index (BMI) 40.0 and over, adult: Secondary | ICD-10-CM | POA: Insufficient documentation

## 2016-11-21 DIAGNOSIS — J449 Chronic obstructive pulmonary disease, unspecified: Secondary | ICD-10-CM | POA: Diagnosis not present

## 2016-11-21 DIAGNOSIS — R079 Chest pain, unspecified: Principal | ICD-10-CM | POA: Insufficient documentation

## 2016-11-21 DIAGNOSIS — F319 Bipolar disorder, unspecified: Secondary | ICD-10-CM | POA: Diagnosis not present

## 2016-11-21 DIAGNOSIS — Z79899 Other long term (current) drug therapy: Secondary | ICD-10-CM | POA: Insufficient documentation

## 2016-11-21 DIAGNOSIS — Z22322 Carrier or suspected carrier of Methicillin resistant Staphylococcus aureus: Secondary | ICD-10-CM | POA: Insufficient documentation

## 2016-11-21 DIAGNOSIS — Z87891 Personal history of nicotine dependence: Secondary | ICD-10-CM | POA: Diagnosis not present

## 2016-11-21 DIAGNOSIS — N183 Chronic kidney disease, stage 3 (moderate): Secondary | ICD-10-CM | POA: Insufficient documentation

## 2016-11-21 DIAGNOSIS — Z86711 Personal history of pulmonary embolism: Secondary | ICD-10-CM | POA: Diagnosis not present

## 2016-11-21 DIAGNOSIS — R7989 Other specified abnormal findings of blood chemistry: Secondary | ICD-10-CM | POA: Insufficient documentation

## 2016-11-21 DIAGNOSIS — Z880 Allergy status to penicillin: Secondary | ICD-10-CM | POA: Insufficient documentation

## 2016-11-21 DIAGNOSIS — Z951 Presence of aortocoronary bypass graft: Secondary | ICD-10-CM | POA: Insufficient documentation

## 2016-11-21 DIAGNOSIS — I2510 Atherosclerotic heart disease of native coronary artery without angina pectoris: Secondary | ICD-10-CM | POA: Insufficient documentation

## 2016-11-21 DIAGNOSIS — Z7982 Long term (current) use of aspirin: Secondary | ICD-10-CM | POA: Insufficient documentation

## 2016-11-21 DIAGNOSIS — Z85828 Personal history of other malignant neoplasm of skin: Secondary | ICD-10-CM | POA: Diagnosis not present

## 2016-11-21 DIAGNOSIS — Z885 Allergy status to narcotic agent status: Secondary | ICD-10-CM | POA: Insufficient documentation

## 2016-11-21 DIAGNOSIS — N179 Acute kidney failure, unspecified: Secondary | ICD-10-CM | POA: Diagnosis not present

## 2016-11-21 DIAGNOSIS — G8929 Other chronic pain: Secondary | ICD-10-CM | POA: Diagnosis not present

## 2016-11-21 DIAGNOSIS — I130 Hypertensive heart and chronic kidney disease with heart failure and stage 1 through stage 4 chronic kidney disease, or unspecified chronic kidney disease: Secondary | ICD-10-CM | POA: Insufficient documentation

## 2016-11-21 DIAGNOSIS — E1122 Type 2 diabetes mellitus with diabetic chronic kidney disease: Secondary | ICD-10-CM | POA: Diagnosis not present

## 2016-11-21 DIAGNOSIS — Z833 Family history of diabetes mellitus: Secondary | ICD-10-CM | POA: Insufficient documentation

## 2016-11-21 DIAGNOSIS — Z8249 Family history of ischemic heart disease and other diseases of the circulatory system: Secondary | ICD-10-CM | POA: Insufficient documentation

## 2016-11-21 DIAGNOSIS — Z7901 Long term (current) use of anticoagulants: Secondary | ICD-10-CM | POA: Insufficient documentation

## 2016-11-21 LAB — TROPONIN I
TROPONIN I: 0.04 ng/mL — AB (ref <0.03)
TROPONIN I: 0.03 ng/mL — AB (ref <0.03)
Troponin I: 0.03 ng/mL (ref <0.03)

## 2016-11-21 LAB — CBC
HEMATOCRIT: 35.1 % — AB (ref 40.0–52.0)
Hemoglobin: 11 g/dL — ABNORMAL LOW (ref 13.0–18.0)
MCH: 24.5 pg — AB (ref 26.0–34.0)
MCHC: 31.4 g/dL — AB (ref 32.0–36.0)
MCV: 78.1 fL — ABNORMAL LOW (ref 80.0–100.0)
Platelets: 352 10*3/uL (ref 150–440)
RBC: 4.5 MIL/uL (ref 4.40–5.90)
RDW: 19.2 % — AB (ref 11.5–14.5)
WBC: 7.3 10*3/uL (ref 3.8–10.6)

## 2016-11-21 LAB — BRAIN NATRIURETIC PEPTIDE: B Natriuretic Peptide: 168 pg/mL — ABNORMAL HIGH (ref 0.0–100.0)

## 2016-11-21 LAB — LIPASE, BLOOD: LIPASE: 35 U/L (ref 11–51)

## 2016-11-21 LAB — BASIC METABOLIC PANEL
Anion gap: 6 (ref 5–15)
BUN: 28 mg/dL — AB (ref 6–20)
CALCIUM: 8.8 mg/dL — AB (ref 8.9–10.3)
CO2: 32 mmol/L (ref 22–32)
Chloride: 95 mmol/L — ABNORMAL LOW (ref 101–111)
Creatinine, Ser: 1.75 mg/dL — ABNORMAL HIGH (ref 0.61–1.24)
GFR calc Af Amer: 43 mL/min — ABNORMAL LOW (ref >60)
GFR, EST NON AFRICAN AMERICAN: 37 mL/min — AB (ref >60)
GLUCOSE: 270 mg/dL — AB (ref 65–99)
Potassium: 3.1 mmol/L — ABNORMAL LOW (ref 3.5–5.1)
Sodium: 133 mmol/L — ABNORMAL LOW (ref 135–145)

## 2016-11-21 LAB — HEPATIC FUNCTION PANEL
ALT: 115 U/L — ABNORMAL HIGH (ref 17–63)
AST: 68 U/L — ABNORMAL HIGH (ref 15–41)
Albumin: 3 g/dL — ABNORMAL LOW (ref 3.5–5.0)
Alkaline Phosphatase: 84 U/L (ref 38–126)
BILIRUBIN TOTAL: 0.5 mg/dL (ref 0.3–1.2)
Bilirubin, Direct: <0.1 mg/dL — ABNORMAL LOW (ref 0.1–0.5)
Total Protein: 6.9 g/dL (ref 6.5–8.1)

## 2016-11-21 LAB — MRSA PCR SCREENING: MRSA BY PCR: POSITIVE — AB

## 2016-11-21 MED ORDER — LEVOTHYROXINE SODIUM 100 MCG PO TABS
100.0000 ug | ORAL_TABLET | Freq: Every day | ORAL | Status: DC
  Administered 2016-11-21: 100 ug via ORAL
  Filled 2016-11-21: qty 1

## 2016-11-21 MED ORDER — METOLAZONE 5 MG PO TABS
5.0000 mg | ORAL_TABLET | Freq: Every day | ORAL | Status: DC
  Administered 2016-11-21 – 2016-11-22 (×2): 5 mg via ORAL
  Filled 2016-11-21 (×2): qty 1

## 2016-11-21 MED ORDER — COLCHICINE 0.6 MG PO TABS
0.6000 mg | ORAL_TABLET | Freq: Every day | ORAL | Status: DC
  Administered 2016-11-21 – 2016-11-22 (×2): 0.6 mg via ORAL
  Filled 2016-11-21 (×2): qty 1

## 2016-11-21 MED ORDER — SODIUM CHLORIDE 0.9 % IV SOLN
INTRAVENOUS | Status: DC
  Administered 2016-11-21 – 2016-11-22 (×2): via INTRAVENOUS

## 2016-11-21 MED ORDER — AMLODIPINE BESYLATE 10 MG PO TABS
10.0000 mg | ORAL_TABLET | Freq: Every day | ORAL | Status: DC
  Administered 2016-11-21 – 2016-11-22 (×2): 10 mg via ORAL
  Filled 2016-11-21 (×2): qty 1

## 2016-11-21 MED ORDER — SUVOREXANT 10 MG PO TABS: 10.0000 mg | ORAL_TABLET | Freq: Every day | ORAL | Status: DC

## 2016-11-21 MED ORDER — ASPIRIN EC 81 MG PO TBEC: 81.0000 mg | DELAYED_RELEASE_TABLET | Freq: Every day | ORAL | Status: DC

## 2016-11-21 MED ORDER — HEPARIN SODIUM (PORCINE) 5000 UNIT/ML IJ SOLN: 5000.0000 [IU] | Freq: Three times a day (TID) | INTRAMUSCULAR | Status: DC

## 2016-11-21 MED ORDER — METOPROLOL TARTRATE 50 MG PO TABS
50.0000 mg | ORAL_TABLET | Freq: Two times a day (BID) | ORAL | Status: DC
  Administered 2016-11-21 – 2016-11-22 (×3): 50 mg via ORAL
  Filled 2016-11-21 (×3): qty 1

## 2016-11-21 MED ORDER — ZIPRASIDONE HCL 80 MG PO CAPS
80.0000 mg | ORAL_CAPSULE | Freq: Two times a day (BID) | ORAL | Status: DC
  Administered 2016-11-21 – 2016-11-22 (×2): 80 mg via ORAL
  Filled 2016-11-21 (×3): qty 1

## 2016-11-21 MED ORDER — FLUTICASONE PROPIONATE 50 MCG/ACT NA SUSP
1.0000 | Freq: Every day | NASAL | Status: DC
  Administered 2016-11-21: 1 via NASAL
  Filled 2016-11-21: qty 16

## 2016-11-21 MED ORDER — RIVAROXABAN 20 MG PO TABS
20.0000 mg | ORAL_TABLET | Freq: Every day | ORAL | Status: DC
  Administered 2016-11-21: 20 mg via ORAL
  Filled 2016-11-21: qty 1

## 2016-11-21 MED ORDER — DOXEPIN HCL 25 MG PO CAPS
25.0000 mg | ORAL_CAPSULE | Freq: Every day | ORAL | Status: DC
  Administered 2016-11-21: 25 mg via ORAL
  Filled 2016-11-21 (×2): qty 1

## 2016-11-21 MED ORDER — ACETAMINOPHEN 325 MG PO TABS
650.0000 mg | ORAL_TABLET | ORAL | Status: DC | PRN
  Administered 2016-11-21 (×2): 650 mg via ORAL
  Filled 2016-11-21 (×2): qty 2

## 2016-11-21 MED ORDER — MONTELUKAST SODIUM 10 MG PO TABS
10.0000 mg | ORAL_TABLET | Freq: Every day | ORAL | Status: DC
  Administered 2016-11-21: 10 mg via ORAL
  Filled 2016-11-21: qty 1

## 2016-11-21 MED ORDER — IPRATROPIUM-ALBUTEROL 0.5-2.5 (3) MG/3ML IN SOLN: 3.0000 mL | RESPIRATORY_TRACT | Status: DC | PRN

## 2016-11-21 MED ORDER — MUPIROCIN 2 % EX OINT
TOPICAL_OINTMENT | Freq: Two times a day (BID) | CUTANEOUS | Status: DC
  Administered 2016-11-21 – 2016-11-22 (×3): via NASAL
  Filled 2016-11-21: qty 22

## 2016-11-21 MED ORDER — PANTOPRAZOLE SODIUM 40 MG PO TBEC
40.0000 mg | DELAYED_RELEASE_TABLET | Freq: Every day | ORAL | Status: DC
  Administered 2016-11-21 – 2016-11-22 (×2): 40 mg via ORAL
  Filled 2016-11-21 (×2): qty 1

## 2016-11-21 MED ORDER — POTASSIUM CHLORIDE CRYS ER 20 MEQ PO TBCR
40.0000 meq | EXTENDED_RELEASE_TABLET | Freq: Every day | ORAL | Status: DC
  Administered 2016-11-21 – 2016-11-22 (×2): 40 meq via ORAL
  Filled 2016-11-21 (×2): qty 2

## 2016-11-21 MED ORDER — MECLIZINE HCL 25 MG PO TABS
25.0000 mg | ORAL_TABLET | Freq: Two times a day (BID) | ORAL | Status: DC | PRN
  Filled 2016-11-21: qty 1

## 2016-11-21 MED ORDER — ASPIRIN EC 325 MG PO TBEC
325.0000 mg | DELAYED_RELEASE_TABLET | Freq: Every day | ORAL | Status: DC
  Administered 2016-11-21 – 2016-11-22 (×2): 325 mg via ORAL
  Filled 2016-11-21 (×2): qty 1

## 2016-11-21 MED ORDER — SODIUM CHLORIDE 0.9% FLUSH
3.0000 mL | Freq: Two times a day (BID) | INTRAVENOUS | Status: DC
  Administered 2016-11-21: 3 mL via INTRAVENOUS

## 2016-11-21 MED ORDER — SENNOSIDES-DOCUSATE SODIUM 8.6-50 MG PO TABS
2.0000 | ORAL_TABLET | Freq: Every day | ORAL | Status: DC
  Administered 2016-11-21 – 2016-11-22 (×2): 2 via ORAL
  Filled 2016-11-21 (×2): qty 2

## 2016-11-21 MED ORDER — DOXYCYCLINE HYCLATE 100 MG PO TABS
100.0000 mg | ORAL_TABLET | Freq: Two times a day (BID) | ORAL | Status: DC
Start: 2016-11-21 — End: 2016-11-22
  Administered 2016-11-21 – 2016-11-22 (×3): 100 mg via ORAL
  Filled 2016-11-21 (×3): qty 1

## 2016-11-21 MED ORDER — POLYSACCHARIDE IRON COMPLEX 150 MG PO CAPS
150.0000 mg | ORAL_CAPSULE | Freq: Every day | ORAL | Status: DC
  Administered 2016-11-21 – 2016-11-22 (×2): 150 mg via ORAL
  Filled 2016-11-21 (×3): qty 1

## 2016-11-21 MED ORDER — BUPROPION HCL ER (SR) 100 MG PO TB12
100.0000 mg | ORAL_TABLET | Freq: Every day | ORAL | Status: DC
  Administered 2016-11-21 – 2016-11-22 (×2): 100 mg via ORAL
  Filled 2016-11-21 (×2): qty 1

## 2016-11-21 MED ORDER — ASPIRIN 325 MG PO TABS
325.0000 mg | ORAL_TABLET | Freq: Every day | ORAL | Status: DC
  Filled 2016-11-21: qty 1

## 2016-11-21 MED ORDER — FUROSEMIDE 40 MG PO TABS
40.0000 mg | ORAL_TABLET | Freq: Two times a day (BID) | ORAL | Status: DC
  Administered 2016-11-21 – 2016-11-22 (×3): 40 mg via ORAL
  Filled 2016-11-21 (×3): qty 1

## 2016-11-21 MED ORDER — MOMETASONE FURO-FORMOTEROL FUM 100-5 MCG/ACT IN AERO
2.0000 | INHALATION_SPRAY | Freq: Two times a day (BID) | RESPIRATORY_TRACT | Status: DC
  Administered 2016-11-21 – 2016-11-22 (×3): 2 via RESPIRATORY_TRACT
  Filled 2016-11-21: qty 8.8

## 2016-11-21 MED ORDER — ZOLPIDEM TARTRATE 5 MG PO TABS
5.0000 mg | ORAL_TABLET | Freq: Every evening | ORAL | Status: DC | PRN
  Administered 2016-11-21: 5 mg via ORAL
  Filled 2016-11-21: qty 1

## 2016-11-21 MED ORDER — ALBUTEROL SULFATE (2.5 MG/3ML) 0.083% IN NEBU
2.5000 mg | INHALATION_SOLUTION | RESPIRATORY_TRACT | Status: DC | PRN
  Administered 2016-11-21: 2.5 mg via RESPIRATORY_TRACT
  Filled 2016-11-21: qty 3

## 2016-11-21 MED ORDER — FEBUXOSTAT 40 MG PO TABS
40.0000 mg | ORAL_TABLET | Freq: Every day | ORAL | Status: DC
  Administered 2016-11-21 – 2016-11-22 (×2): 40 mg via ORAL
  Filled 2016-11-21 (×2): qty 1

## 2016-11-21 NOTE — Consult Note (Signed)
[name] is a 71 y.o. male  [id number]  Primary Cardiologist: Dr. Neoma Laming  Reason for Consultation: Chest pain  HPI: 71yo white male with extensive history of chronic diseases included CHF, CKD, CAD, chronic bronchitis, asthma, and diabetes. Pt presented with acute onset of left arm pain which moved to chest and center of his back.    Review of Systems: Center of back pain and dyspnea with wheezing.    Past Medical History:  Diagnosis Date  . Abscess 10/08/2016  . Abscess of right leg excluding foot   . Acute on chronic renal failure ([place]) 08/23/2016  . Acute on chronic respiratory failure ([place]) 08/23/2016  . AKI (acute kidney injury) ([place]) 08/27/2016  . Arthritis   . Asthma   . Bipolar 1 disorder ([place]) 07/23/2016   Last Assessment & Plan:  On multiple meds including geodon and seems to be stable.    [name] CAD (coronary artery disease) 04/27/2016  . Cellulitis 09/21/2016  . Chest pain, rule out acute myocardial infarction 02/16/2016  . CHF (congestive heart failure) ([place])   . Chronic bronchitis ([place]) 07/23/2016   Last Assessment & Plan:  Breathing at baseline now with some chronic sob.   . Current use of proton pump inhibitor 08/23/2016  . Demand ischemia ([place]) 08/27/2016  . Diabetes ([place])   . DVT (deep venous thrombosis) ([place]) 08/27/2016  . Electrolyte imbalance 08/23/2016  . Elevated troponin 08/27/2016  . Heart trouble   . History of asthma 08/23/2016  . History of bipolar disorder 08/23/2016  . History of chronic pain 08/23/2016  . History of gastric ulcer 08/23/2016  . History of stomach ulcers   . History of stroke 07/23/2016   Last Assessment & Plan:  On asa/plavix so won't increase asa for now re dvt prevention  . HTN (hypertension) 04/27/2016  . Hypertension   . Lactic acidosis 08/23/2016  . 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 Obesity, unspecified 11/22/2013  . OSA (obstructive sleep apnea) 08/23/2016  . Pulmonary embolism ([place]) 08/23/2016  . Reactive airway disease 11/22/2013  . S/P CABG x 2   .  SIRS (systemic inflammatory response syndrome) ([place]) 08/23/2016  . Skin cancer   . Stroke [facility])     Medications Prior to Admission  Medication Sig Dispense Refill  . acetaminophen (TYLENOL) 325 MG tablet Take 650 mg by mouth every 4 (four) hours as needed (general discomfort).    [name] albuterol (PROAIR HFA) 108 (90 Base) MCG/ACT inhaler Inhale 2 puffs into the lungs every 4 (four) hours as needed for wheezing or shortness of breath.    [name] amLODipine (NORVASC) 10 MG tablet Take 10 mg by mouth daily.     [name] aspirin 81 MG EC tablet Chew 81 mg by mouth daily.    [name] buPROPion (WELLBUTRIN SR) 100 MG 12 hr tablet Take 100 mg by mouth daily.     . ciprofloxacin (CIPRO) 500 MG tablet Take 500 mg by mouth 2 (two) times daily.    . colchicine 0.6 MG tablet Take 0.6 mg by mouth daily.    [name] doxepin (SINEQUAN) 25 MG capsule Take 25 mg by mouth at bedtime.     [name] doxycycline (VIBRA-TABS) 100 MG tablet Take 100 mg by mouth 2 (two) times daily.    . febuxostat (ULORIC) 40 MG tablet Take 40 mg by mouth daily.    . fluticasone (FLONASE) 50 MCG/ACT nasal spray Place 1 spray into both nostrils daily.    . Fluticasone-Salmeterol (ADVAIR) 100-50 MCG/DOSE AEPB  Inhale 1 puff into the lungs 2 (two) times daily.    . furosemide (LASIX) 40 MG tablet Take 40 mg by mouth 2 (two) times daily.    [name] ipratropium-albuterol (DUONEB) 0.5-2.5 (3) MG/3ML SOLN Inhale 3 mLs into the lungs every 4 (four) hours as needed (shortness of breath/wheezing).     . iron polysaccharides (NIFEREX) 150 MG capsule Take 150 mg by mouth daily.    [name] levothyroxine (SYNTHROID, LEVOTHROID) 100 MCG tablet Take 100 mcg by mouth daily before breakfast.     . meclizine (ANTIVERT) 25 MG tablet Take 25 mg by mouth 2 (two) times daily as needed for dizziness.     . metolazone (ZAROXOLYN) 5 MG tablet Take 5 mg by mouth daily.    . metoprolol (LOPRESSOR) 50 MG tablet Take 50 mg by mouth 2 (two) times daily.     . montelukast (SINGULAIR) 10 MG tablet Take 1 tablet by  mouth at bedtime.     . pantoprazole (PROTONIX) 40 MG tablet Take 40 mg by mouth daily.     . potassium chloride SA (K-DUR,KLOR-CON) 20 MEQ tablet Take 40 mEq by mouth daily.     . rivaroxaban (XARELTO) 20 MG TABS tablet Take 1 tablet (20 mg total) by mouth daily with supper. 30 tablet 0  . senna-docusate (SENOKOT-S) 8.6-50 MG tablet Take 2 tablets by mouth daily.    . Suvorexant (BELSOMRA) 10 MG TABS Take 10 mg by mouth at bedtime.    . ziprasidone (GEODON) 80 MG capsule Take 80 mg by mouth 2 (two) times daily with a meal.       . amLODipine  10 mg Oral Daily  . aspirin EC  325 mg Oral Daily  . buPROPion  100 mg Oral Daily  . colchicine  0.6 mg Oral Daily  . doxepin  25 mg Oral QHS  . doxycycline  100 mg Oral BID  . febuxostat  40 mg Oral Daily  . fluticasone  1 spray Each Nare Daily  . furosemide  40 mg Oral BID  . iron polysaccharides  150 mg Oral Daily  . levothyroxine  100 mcg Oral QAC breakfast  . metolazone  5 mg Oral Daily  . metoprolol tartrate  50 mg Oral BID  . mometasone-formoterol  2 puff Inhalation BID  . montelukast  10 mg Oral QHS  . mupirocin ointment   Nasal BID  . pantoprazole  40 mg Oral Daily  . potassium chloride SA  40 mEq Oral Daily  . rivaroxaban  20 mg Oral Q supper  . senna-docusate  2 tablet Oral Daily  . sodium chloride flush  3 mL Intravenous Q12H  . ziprasidone  80 mg Oral BID WC    Infusions: . sodium chloride 75 mL/hr at 11/21/16 1045    Allergies  Allergen Reactions  . Dilaudid [Hydromorphone Hcl] Other (See Comments)    "CANNOT HEAR, CANNOT THINK"    . Hydrocodone-Acetaminophen Nausea And Vomiting  . Hydromorphone Nausea Only    Vomiting, diarrhea.  . Morphine And Related Other (See Comments)    Patient was told by a doctor that this "would kill" him  . Tetracycline Other (See Comments)    Patient was told by a doctor that this "would kill" him  . Tetracyclines & Related Other (See Comments)    Patient doesn't recall the reaction   . Penicillins Rash    Has patient had a PCN reaction causing immediate rash, facial/tongue/throat swelling, SOB or lightheadedness with hypotension: Yes Has  patient had a PCN reaction causing severe rash involving mucus membranes or skin necrosis: No Has patient had a PCN reaction that required hospitalization No Has patient had a PCN reaction occurring within the last 10 years: No If all of the above answers are "NO", then may proceed with Cephalosporin use.   . Sulfa Antibiotics Rash    Social History   Social History  . Marital status: Divorced    Spouse name: N/A  . Number of children: N/A  . Years of education: N/A   Occupational History  . disabled    Social History Main Topics  . Smoking status: Former Smoker    Quit date: 03/15/1963  . Smokeless tobacco: Never Used  . Alcohol use No  . Drug use: No  . Sexual activity: No   Other Topics Concern  . Not on file   Social History Narrative  . No narrative on file    Family History  Problem Relation Age of Onset  . Diabetes Mother   . Heart attack Father   . Diabetes Brother   . Heart attack Brother     PHYSICAL EXAM: Vitals:   11/21/16 0800 11/21/16 0929  BP: 139/78 (!) 133/51  Pulse:  77  Resp: (!) 28 16  Temp:  98.5 F (36.9 C)     Intake/Output Summary (Last 24 hours) at 11/21/16 1351 Last data filed at 11/21/16 1130  Gross per 24 hour  Intake                0 ml  Output                0 ml  Net                0 ml    General:  Well appearing. Wheezing audible during conversation. HEENT: normal Neck: supple. no JVD. Carotids 2+ bilat; no bruits. No lymphadenopathy or thryomegaly appreciated. Cor: PMI nondisplaced. Regular rate & rhythm. No rubs, gallops or murmurs. Lungs: clear Abdomen: soft, nontender. Extremities: Bilateral 1+ non pitting edema. Wound noted on leg. No cyanosis, clubbing or rashes. Neuro: alert & oriented x 3, cranial nerves grossly intact. moves all 4 extremities w/o  difficulty. Affect pleasant.  ECG: Unable to to read EKG tracing due to artifact.  Results for orders placed or performed during the hospital encounter of 11/21/16 (from the past 24 hour(s))  Basic metabolic panel     Status: Abnormal   Collection Time: 11/21/16  5:00 AM  Result Value Ref Range   Sodium 133 (L) 135 - 145 mmol/L   Potassium 3.1 (L) 3.5 - 5.1 mmol/L   Chloride 95 (L) 101 - 111 mmol/L   CO2 32 22 - 32 mmol/L   Glucose, Bld 270 (H) 65 - 99 mg/dL   BUN 28 (H) 6 - 20 mg/dL   Creatinine, Ser 1.75 (H) 0.61 - 1.24 mg/dL   Calcium 8.8 (L) 8.9 - 10.3 mg/dL   GFR calc non Af Amer 37 (L) >60 mL/min   GFR calc Af Amer 43 (L) >60 mL/min   Anion gap 6 5 - 15  CBC     Status: Abnormal   Collection Time: 11/21/16  5:00 AM  Result Value Ref Range   WBC 7.3 3.8 - 10.6 K/uL   RBC 4.50 4.40 - 5.90 MIL/uL   Hemoglobin 11.0 (L) 13.0 - 18.0 g/dL   HCT 35.1 (L) 40.0 - 52.0 %   MCV 78.1 (L) 80.0 - 100.0 fL  MCH 24.5 (L) 26.0 - 34.0 pg   MCHC 31.4 (L) 32.0 - 36.0 g/dL   RDW 19.2 (H) 11.5 - 14.5 %   Platelets 352 150 - 440 K/uL  Troponin I     Status: Abnormal   Collection Time: 11/21/16  5:00 AM  Result Value Ref Range   Troponin I 0.04 (HH) <0.03 ng/mL  Brain natriuretic peptide     Status: Abnormal   Collection Time: 11/21/16  5:00 AM  Result Value Ref Range   B Natriuretic Peptide 168.0 (H) 0.0 - 100.0 pg/mL  Hepatic function panel     Status: Abnormal   Collection Time: 11/21/16  5:00 AM  Result Value Ref Range   Total Protein 6.9 6.5 - 8.1 g/dL   Albumin 3.0 (L) 3.5 - 5.0 g/dL   AST 68 (H) 15 - 41 U/L   ALT 115 (H) 17 - 63 U/L   Alkaline Phosphatase 84 38 - 126 U/L   Total Bilirubin 0.5 0.3 - 1.2 mg/dL   Bilirubin, Direct <0.1 (L) 0.1 - 0.5 mg/dL   Indirect Bilirubin NOT CALCULATED 0.3 - 0.9 mg/dL  Lipase, blood     Status: None   Collection Time: 11/21/16  5:00 AM  Result Value Ref Range   Lipase 35 11 - 51 U/L  MRSA PCR Screening     Status: Abnormal   Collection  Time: 11/21/16 10:00 AM  Result Value Ref Range   MRSA by PCR POSITIVE (A) NEGATIVE  Troponin I     Status: Abnormal   Collection Time: 11/21/16 10:51 AM  Result Value Ref Range   Troponin I 0.03 (HH) <0.03 ng/mL   Dg Chest Port 1 View  Result Date: 11/21/2016 CLINICAL DATA:  71 y/o  M; chest pain. EXAM: PORTABLE CHEST 1 VIEW COMPARISON:  10/08/2016 chest radiograph. FINDINGS: Stable cardiomegaly. Stable elevated right hemidiaphragm and right basilar opacity probably representing associated atelectasis. Post median sternotomy with three upper broken wires unchanged from prior radiographs. Aortic atherosclerosis with calcification. No new focal consolidation. Bones are unremarkable. IMPRESSION: Stable cardiomegaly and elevated right hemidiaphragm with right basilar atelectasis. No new acute pulmonary process identified. Electronically Signed   By: Kristine Garbe M.D.   On: 11/21/2016 05:31     ASSESSMENT AND PLAN: Atypical chest pain/back pain with possible congestive heart failure exacerbation with mildly elevated troponin and respiratory wheezing.  CHF: Continue oral lasix 40mg  BID and metolazone 5mg . If output is not adequate consider IV lasix bolus. Echo order placed to evaluate LVEF and valves. Continue metoprolol, amlodipine. Kidney function is borderline, spironolactone contraindicated at this time. Consider ACE/ARB prior to discharge.  Hypokalemia: Potassium 3.1 on labs this morning. Potassium replaced, repeat labs in the morning.  Troponin: Mildly elevated, likely due to demand ischemia. Continue to trend.   Will continue to follow.  Dyspnea: Audible wheezing with conversation and on auscultation. Recommend breathing treatments as indicated.   Jake Bathe, NP-C

## 2016-11-21 NOTE — H&P (Signed)
GARRY NICOLINI is an 71 y.o. male.    Chief Complaint: Chest pain HPI: This a 71 year old male with multiple medical problems. He started having left-sided chest pain early this morning. The pain radiated to his left shoulder and his left arm. I had some nausea but no vomiting. Currently said the pain is just in his shoulder but is improved. He does have a history of congestive heart failure and coronary artery disease and followed by Dr. Yancey Flemings. His troponin is just slightly elevated certainly weight admit him for further evaluation and treatment.  Past Medical History:  Diagnosis Date  . Abscess 10/08/2016  . Abscess of right leg excluding foot   . Acute on chronic renal failure ([place]) 08/23/2016  . Acute on chronic respiratory failure ([place]) 08/23/2016  . AKI (acute kidney injury) ([place]) 08/27/2016  . Arthritis   . Asthma   . Bipolar 1 disorder ([place]) 07/23/2016   Last Assessment & Plan:  On multiple meds including geodon and seems to be stable.    [name] CAD (coronary artery disease) 04/27/2016  . Cellulitis 09/21/2016  . Chest pain, rule out acute myocardial infarction 02/16/2016  . CHF (congestive heart failure) ([place])   . Chronic bronchitis ([place]) 07/23/2016   Last Assessment & Plan:  Breathing at baseline now with some chronic sob.   . Current use of proton pump inhibitor 08/23/2016  . Demand ischemia ([place]) 08/27/2016  . Diabetes ([place])   . DVT (deep venous thrombosis) ([place]) 08/27/2016  . Electrolyte imbalance 08/23/2016  . Elevated troponin 08/27/2016  . Heart trouble   . History of asthma 08/23/2016  . History of bipolar disorder 08/23/2016  . History of chronic pain 08/23/2016  . History of gastric ulcer 08/23/2016  . History of stomach ulcers   . History of stroke 07/23/2016   Last Assessment & Plan:  On asa/plavix so won't increase asa for now re dvt prevention  . HTN (hypertension) 04/27/2016  . Hypertension   . Lactic acidosis 08/23/2016  . 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 Obesity, unspecified 11/22/2013  . OSA  (obstructive sleep apnea) 08/23/2016  . Pulmonary embolism ([place]) 08/23/2016  . Reactive airway disease 11/22/2013  . S/P CABG x 2   . SIRS (systemic inflammatory response syndrome) ([place]) 08/23/2016  . Skin cancer   . Stroke [facility])     Past Surgical History:  Procedure Laterality Date  . CARDIAC SURGERY    . CHOLECYSTECTOMY    . GALLBLADDER SURGERY    . HEART BYPASS    . HERNIA REPAIR    . IR ANGIOGRAM PULMONARY BILATERAL SELECTIVE  08/23/2016  . IR ANGIOGRAM SELECTIVE EACH ADDITIONAL VESSEL  08/23/2016  . IR ANGIOGRAM SELECTIVE EACH ADDITIONAL VESSEL  08/23/2016  . IR INFUSION THROMBOL ARTERIAL INITIAL (MS)  08/23/2016  . IR INFUSION THROMBOL ARTERIAL INITIAL (MS)  08/23/2016  . IR THROMB F/U EVAL ART/VEN FINAL DAY ([place])  08/24/2016  . IR US GUIDE VASC ACCESS RIGHT  08/23/2016  . KIDNEY SURGERY      Family History  Problem Relation Age of Onset  . Diabetes Mother   . Heart attack Father   . Diabetes Brother   . Heart attack Brother    Social History:  reports that he quit smoking about 53 years ago. He has never used smokeless tobacco. He reports that he does not drink alcohol or use drugs.  Allergies:  Allergies  Allergen Reactions  . Dilaudid [Hydromorphone Hcl] Other (See Comments)    "CANNOT HEAR, CANNOT THINK"    .  Hydrocodone-Acetaminophen Nausea And Vomiting  . Hydromorphone Nausea Only    Vomiting, diarrhea.  . Morphine And Related Other (See Comments)    Patient was told by a doctor that this "would kill" him  . Tetracycline Other (See Comments)    Patient was told by a doctor that this "would kill" him  . Tetracyclines & Related Other (See Comments)    Patient doesn't recall the reaction  . Penicillins Rash    Has patient had a PCN reaction causing immediate rash, facial/tongue/throat swelling, SOB or lightheadedness with hypotension: Yes Has patient had a PCN reaction causing severe rash involving mucus membranes or skin necrosis: No Has patient had a PCN reaction that  required hospitalization No Has patient had a PCN reaction occurring within the last 10 years: No If all of the above answers are "NO", then may proceed with Cephalosporin use.   . Sulfa Antibiotics Rash     (Not in a hospital admission)  Results for orders placed or performed during the hospital encounter of 11/21/16 (from the past 48 hour(s))  Basic metabolic panel     Status: Abnormal   Collection Time: 11/21/16  5:00 AM  Result Value Ref Range   Sodium 133 (L) 135 - 145 mmol/L   Potassium 3.1 (L) 3.5 - 5.1 mmol/L   Chloride 95 (L) 101 - 111 mmol/L   CO2 32 22 - 32 mmol/L   Glucose, Bld 270 (H) 65 - 99 mg/dL   BUN 28 (H) 6 - 20 mg/dL   Creatinine, Ser 1.75 (H) 0.61 - 1.24 mg/dL   Calcium 8.8 (L) 8.9 - 10.3 mg/dL   GFR calc non Af Amer 37 (L) >60 mL/min   GFR calc Af Amer 43 (L) >60 mL/min    Comment: (NOTE) The eGFR has been calculated using the CKD EPI equation. This calculation has not been validated in all clinical situations. eGFR's persistently <60 mL/min signify possible Chronic Kidney Disease.    Anion gap 6 5 - 15  CBC     Status: Abnormal   Collection Time: 11/21/16  5:00 AM  Result Value Ref Range   WBC 7.3 3.8 - 10.6 K/uL   RBC 4.50 4.40 - 5.90 MIL/uL   Hemoglobin 11.0 (L) 13.0 - 18.0 g/dL   HCT 35.1 (L) 40.0 - 52.0 %   MCV 78.1 (L) 80.0 - 100.0 fL   MCH 24.5 (L) 26.0 - 34.0 pg   MCHC 31.4 (L) 32.0 - 36.0 g/dL   RDW 19.2 (H) 11.5 - 14.5 %   Platelets 352 150 - 440 K/uL  Troponin I     Status: Abnormal   Collection Time: 11/21/16  5:00 AM  Result Value Ref Range   Troponin I 0.04 (HH) <0.03 ng/mL    Comment: CRITICAL RESULT CALLED TO, READ BACK BY AND VERIFIED WITH KENNY PATEL AT 0532 11/21/16.PMH  Brain natriuretic peptide     Status: Abnormal   Collection Time: 11/21/16  5:00 AM  Result Value Ref Range   B Natriuretic Peptide 168.0 (H) 0.0 - 100.0 pg/mL  Hepatic function panel     Status: Abnormal   Collection Time: 11/21/16  5:00 AM  Result Value  Ref Range   Total Protein 6.9 6.5 - 8.1 g/dL   Albumin 3.0 (L) 3.5 - 5.0 g/dL   AST 68 (H) 15 - 41 U/L   ALT 115 (H) 17 - 63 U/L   Alkaline Phosphatase 84 38 - 126 U/L   Total Bilirubin 0.5 0.3 -  1.2 mg/dL   Bilirubin, Direct <0.1 (L) 0.1 - 0.5 mg/dL   Indirect Bilirubin NOT CALCULATED 0.3 - 0.9 mg/dL  Lipase, blood     Status: None   Collection Time: 11/21/16  5:00 AM  Result Value Ref Range   Lipase 35 11 - 51 U/L   Dg Chest Port 1 View  Result Date: 11/21/2016 CLINICAL DATA:  71 y/o  M; chest pain. EXAM: PORTABLE CHEST 1 VIEW COMPARISON:  10/08/2016 chest radiograph. FINDINGS: Stable cardiomegaly. Stable elevated right hemidiaphragm and right basilar opacity probably representing associated atelectasis. Post median sternotomy with three upper broken wires unchanged from prior radiographs. Aortic atherosclerosis with calcification. No new focal consolidation. Bones are unremarkable. IMPRESSION: Stable cardiomegaly and elevated right hemidiaphragm with right basilar atelectasis. No new acute pulmonary process identified. Electronically Signed   By: Kristine Garbe M.D.   On: 11/21/2016 05:31    Review of Systems  Constitutional: Negative for chills and fever.  HENT: Negative for hearing loss.   Eyes: Negative for blurred vision.  Respiratory: Negative for shortness of breath.   Cardiovascular: Positive for chest pain.  Gastrointestinal: Negative for nausea and vomiting.  Genitourinary: Negative for dysuria.  Musculoskeletal: Positive for joint pain.  Skin: Negative for rash.  Neurological: Negative for dizziness.    Blood pressure 137/79, pulse 77, temperature 98.1 F (36.7 C), temperature source Oral, resp. rate (!) 21, height _0  (1.702 m), weight (!) 150.1 kg (331 lb), SpO2 95 %. Physical Exam  Constitutional: He is oriented to person, place, and time.  Obese white male in no acute distress  HENT:  Head: Normocephalic and atraumatic.  Oral mucosa dry  Eyes:  Conjunctivae are normal. No scleral icterus.  Neck: Neck supple. No JVD present. No thyromegaly present.  Cardiovascular: Normal rate and regular rhythm.   No murmur heard. Respiratory: Effort normal and breath sounds normal. No respiratory distress. He has no wheezes.  GI: Soft. Bowel sounds are normal. He exhibits no distension and no mass. There is no tenderness.  Musculoskeletal: Normal range of motion. He exhibits edema.  Neurological: He is alert and oriented to person, place, and time.  Skin: Skin is warm and dry.  Age 45 sacral decubitus     Assessment/Plan 1. Chest pain. Patient does have history coronary artery disease. Has slight bump in his troponin however he does have chronic renal failure which is worsening today. We'll go ahead and start on aspirin continue him on his beta blocker. Would consult Dr. Darrow Bussing from cardiology for further evaluation. 2. Acute on chronic renal failure. Patient appears to have stage II chronic renal failure however creatinine is up more today. We will start him on some IV fluids and a mild rate to follow this. There was no sign of CHF on chest x-ray. 3. COPD. We'll put him back on his inhalers. No side exacerbation at this point. 4. Obstructive sleep apnea. He does wear CPAP at home. We'll continue this in the hospital. 5. History congestive heart failure. No sign of failure today. We'll go ahead and monitor this closely since forgetting of fluids.  Total time spent 45 minutes indication  Baxter Hire, MD 11/21/2016, 7:31 AM

## 2016-11-21 NOTE — [place] FL2 (Signed)
[place] LEVEL OF CARE SCREENING TOOL     IDENTIFICATION  Patient Name: [name] Birthdate: [birth year] Sex: male Admission Date (Current Location): 11/21/2016  [place] and [place] Number:  Engineering geologist and Address:  [facility], [address] [place], [place] [address]      Provider Number: [id number]  Attending Physician Name and Address:  Baxter Hire, MD  Relative Name and Phone Number:       Current Level of Care: Hospital Recommended Level of Care: [place] Prior Approval Number:    Date Approved/Denied:   PASRR Number: [id number]  Discharge Plan: SNF    Current Diagnoses: Patient Active Problem List   Diagnosis Date Noted  . Abscess 10/08/2016  . Abscess of right leg excluding foot   . Cellulitis 09/21/2016  . AKI (acute kidney injury) ([place]) 08/27/2016  . Demand ischemia ([place]) 08/27/2016  . Elevated troponin 08/27/2016  . 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 DVT (deep venous thrombosis) ([place]) 08/27/2016  . Pressure injury of skin 08/24/2016  . Pulmonary embolism ([place]) 08/23/2016  . SIRS (systemic inflammatory response syndrome) ([place]) 08/23/2016  . Acute on chronic renal failure ([place]) 08/23/2016  . Acute on chronic respiratory failure ([place]) 08/23/2016  . Lactic acidosis 08/23/2016  . Electrolyte imbalance 08/23/2016  . Current use of proton pump inhibitor 08/23/2016  . OSA (obstructive sleep apnea) 08/23/2016  . History of bipolar disorder 08/23/2016  . History of gastric ulcer 08/23/2016  . History of asthma 08/23/2016  . History of chronic pain 08/23/2016  . Acute pulmonary embolism ([place]) 08/23/2016  . Age-related osteoporosis with current pathological fracture with routine healing 07/23/2016  . Bipolar 1 disorder ([place]) 07/23/2016  . Chronic bronchitis ([place]) 07/23/2016  . History of stroke 07/23/2016  . Chest pain 07/14/2016  . UTI (urinary tract infection) 04/27/2016  . CAD  (coronary artery disease) 04/27/2016  . Diabetes ([place]) 04/27/2016  . HTN (hypertension) 04/27/2016  . Chronic diastolic CHF (congestive heart failure) ([place]) 04/27/2016  . CKD (chronic kidney disease), stage III 04/27/2016  . Chest pain, rule out acute myocardial infarction 02/16/2016  . Obesity, unspecified 11/22/2013  . Reactive airway disease 11/22/2013    Orientation RESPIRATION BLADDER Height & Weight     Self, Time, Situation, Place  Normal Incontinent Weight: (!) 346 lb 9.6 oz (157.2 kg) Height:  5\' 7"  (170.2 cm)  BEHAVIORAL SYMPTOMS/MOOD NEUROLOGICAL BOWEL NUTRITION STATUS   (none)  (none) Continent Diet (2gm na)  AMBULATORY STATUS COMMUNICATION OF NEEDS Skin   Limited Assist Verbally Normal                       Personal Care Assistance Level of Assistance  Bathing, Dressing Bathing Assistance: Limited assistance   Dressing Assistance: Limited assistance     Functional Limitations Info   (no issues)          SPECIAL CARE FACTORS FREQUENCY  PT (By licensed PT)                    Contractures Contractures Info: Not present    Additional Factors Info  Code Status, Allergies, Isolation Precautions (mrsa) Code Status Info: full Allergies Info: dilaudid           Current Medications (11/21/2016):  This is the current hospital active medication list Current Facility-Administered Medications  Medication Dose Route Frequency Provider Last Rate Last Dose  . 0.9 %  sodium chloride infusion   Intravenous  Continuous Baxter Hire, MD      . acetaminophen (TYLENOL) tablet 650 mg  650 mg Oral Q4H PRN Baxter Hire, MD      . albuterol (PROVENTIL HFA;VENTOLIN HFA) 108 (90 Base) MCG/ACT inhaler 2 puff  2 puff Inhalation Q4H PRN Baxter Hire, MD      . amLODipine (NORVASC) tablet 10 mg  10 mg Oral Daily Baxter Hire, MD      . aspirin EC tablet 81 mg  81 mg Oral Daily Harrel Lemon D, MD      . aspirin tablet 325 mg  325 mg Oral Daily Baxter Hire, MD      . buPROPion [facility] SR) 12 hr tablet 100 mg  100 mg Oral Daily Harrel Lemon D, MD      . colchicine tablet 0.6 mg  0.6 mg Oral Daily Baxter Hire, MD      . doxepin (SINEQUAN) capsule 25 mg  25 mg Oral QHS Baxter Hire, MD      . doxycycline (VIBRA-TABS) tablet 100 mg  100 mg Oral BID Baxter Hire, MD      . febuxostat (ULORIC) tablet 40 mg  40 mg Oral Daily Baxter Hire, MD      . fluticasone [facility]) 50 MCG/ACT nasal spray 1 spray  1 spray Each Nare Daily Baxter Hire, MD      . furosemide (LASIX) tablet 40 mg  40 mg Oral BID Baxter Hire, MD      . heparin injection 5,000 Units  5,000 Units Subcutaneous Q8H Baxter Hire, MD      . ipratropium-albuterol (DUONEB) 0.5-2.5 (3) MG/3ML nebulizer solution 3 mL  3 mL Inhalation Q4H PRN Baxter Hire, MD      . iron polysaccharides (NIFEREX) capsule 150 mg  150 mg Oral Daily Baxter Hire, MD      . Derrill Memo ON 11/22/2016] levothyroxine (SYNTHROID, LEVOTHROID) tablet 100 mcg  100 mcg Oral QAC breakfast Baxter Hire, MD      . meclizine (ANTIVERT) tablet 25 mg  25 mg Oral BID PRN Baxter Hire, MD      . metolazone (ZAROXOLYN) tablet 5 mg  5 mg Oral Daily Baxter Hire, MD      . metoprolol tartrate (LOPRESSOR) tablet 50 mg  50 mg Oral BID Baxter Hire, MD      . mometasone-formoterol [facility]) 100-5 MCG/ACT inhaler 2 puff  2 puff Inhalation BID Baxter Hire, MD      . montelukast (SINGULAIR) tablet 10 mg  10 mg Oral QHS Baxter Hire, MD      . pantoprazole (PROTONIX) EC tablet 40 mg  40 mg Oral Daily Harrel Lemon D, MD      . potassium chloride SA (K-DUR,KLOR-CON) CR tablet 40 mEq  40 mEq Oral Daily Baxter Hire, MD      . rivaroxaban [facility]) tablet 20 mg  20 mg Oral Q supper Baxter Hire, MD      . senna-docusate (Senokot-S) tablet 2 tablet  2 tablet Oral Daily Harrel Lemon D, MD      . sodium chloride flush (NS) 0.9 % injection 3 mL  3 mL Intravenous Q12H  Baxter Hire, MD      . Suvorexant TABS 10 mg  10 mg Oral QHS Harrel Lemon D, MD      . ziprasidone (GEODON) capsule 80 mg  80 mg Oral BID WC Edwina Barth,  Chrystie Nose, MD         Discharge Medications: Please see discharge summary for a list of discharge medications.  Relevant Imaging Results:  Relevant Lab Results:   Additional Information [id number]  Shela Leff, LCSW

## 2016-11-21 NOTE — Care Management Obs Status (Signed)
[place] NOTIFICATION   Patient Details  Name: [name] MRN: [id number] Date of Birth: 02/12/1946   Medicare Observation Status Notification Given:  Yes [place] Allan)    [place] A, RN 11/21/2016, 5:09 PM
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
## 2016-11-21 NOTE — ED Provider Notes (Signed)
[facility] Emergency Department Provider Note  ____________________________________________   First MD Initiated Contact with Patient 11/21/16 [phone]     (approximate)  I have reviewed the triage vital signs and the nursing notes.   HISTORY  Chief Complaint Chest Pain    HPI [name] is a 71 y.o. male with an extensive chronic medical history who presents by EMS for evaluation of acute onset chest pain.  He reportedly had some episodes during the day and then awoke from sleep with severe left-sided chest pain radiating down to his arm which she states is different than normal.  He always has some degree of respiratory distress but he feels that his shortness of breath is much worse with the chest pain.  He received a full dose aspirin prior to arrival and was given nitroglycerin by the nursing home staff.  No interventions help and any amount of exertion makes the symptoms worse.  He is a resident at [facility].  He describes the pain as severe and he is concerned it is his heart.  He denies fever/chills, nausea, vomiting, abdominal pain.  He has chronic pain in his right leg due to a wound for which she was recently hospitalized for sepsis.He uses a CPAP at night and states that he was using it when the chest pain started as well as the shortness of breath.  He feels a little bit better at this time but the pain still moderate to severe.  He does report having a history of chest pain.   Past Medical History:  Diagnosis Date  . Abscess 10/08/2016  . Abscess of right leg excluding foot   . Acute on chronic renal failure ([place]) 08/23/2016  . Acute on chronic respiratory failure ([place]) 08/23/2016  . AKI (acute kidney injury) ([place]) 08/27/2016  . Arthritis   . Asthma   . Bipolar 1 disorder ([place]) 07/23/2016   Last Assessment & Plan:  On multiple meds including geodon and seems to be stable.    [name] CAD (coronary artery disease) 04/27/2016  . Cellulitis 09/21/2016   . Chest pain, rule out acute myocardial infarction 02/16/2016  . CHF (congestive heart failure) ([place])   . Chronic bronchitis ([place]) 07/23/2016   Last Assessment & Plan:  Breathing at baseline now with some chronic sob.   . Current use of proton pump inhibitor 08/23/2016  . Demand ischemia ([place]) 08/27/2016  . Diabetes ([place])   . DVT (deep venous thrombosis) ([place]) 08/27/2016  . Electrolyte imbalance 08/23/2016  . Elevated troponin 08/27/2016  . Heart trouble   . History of asthma 08/23/2016  . History of bipolar disorder 08/23/2016  . History of chronic pain 08/23/2016  . History of gastric ulcer 08/23/2016  . History of stomach ulcers   . History of stroke 07/23/2016   Last Assessment & Plan:  On asa/plavix so won't increase asa for now re dvt prevention  . HTN (hypertension) 04/27/2016  . Hypertension   . Lactic acidosis 08/23/2016  . 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 Obesity, unspecified 11/22/2013  . OSA (obstructive sleep apnea) 08/23/2016  . Pulmonary embolism ([place]) 08/23/2016  . Reactive airway disease 11/22/2013  . S/P CABG x 2   . SIRS (systemic inflammatory response syndrome) ([place]) 08/23/2016  . Skin cancer   . Stroke [facility])     Patient Active Problem List   Diagnosis Date Noted  . Abscess 10/08/2016  . Abscess of right leg excluding foot   . Cellulitis 09/21/2016  .  AKI (acute kidney injury) ([place]) 08/27/2016  . Demand ischemia ([place]) 08/27/2016  . Elevated troponin 08/27/2016  . 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 DVT (deep venous thrombosis) ([place]) 08/27/2016  . Pressure injury of skin 08/24/2016  . Pulmonary embolism ([place]) 08/23/2016  . SIRS (systemic inflammatory response syndrome) ([place]) 08/23/2016  . Acute on chronic renal failure ([place]) 08/23/2016  . Acute on chronic respiratory failure ([place]) 08/23/2016  . Lactic acidosis 08/23/2016  . Electrolyte imbalance 08/23/2016  . Current use of proton pump inhibitor 08/23/2016  . OSA (obstructive sleep apnea) 08/23/2016  . History of bipolar disorder 08/23/2016  .  History of gastric ulcer 08/23/2016  . History of asthma 08/23/2016  . History of chronic pain 08/23/2016  . Acute pulmonary embolism ([place]) 08/23/2016  . Age-related osteoporosis with current pathological fracture with routine healing 07/23/2016  . Bipolar 1 disorder ([place]) 07/23/2016  . Chronic bronchitis ([place]) 07/23/2016  . History of stroke 07/23/2016  . Chest pain 07/14/2016  . UTI (urinary tract infection) 04/27/2016  . CAD (coronary artery disease) 04/27/2016  . Diabetes ([place]) 04/27/2016  . HTN (hypertension) 04/27/2016  . Chronic diastolic CHF (congestive heart failure) ([place]) 04/27/2016  . CKD (chronic kidney disease), stage III 04/27/2016  . Chest pain, rule out acute myocardial infarction 02/16/2016  . Obesity, unspecified 11/22/2013  . Reactive airway disease 11/22/2013    Past Surgical History:  Procedure Laterality Date  . CARDIAC SURGERY    . CHOLECYSTECTOMY    . GALLBLADDER SURGERY    . HEART BYPASS    . HERNIA REPAIR    . IR ANGIOGRAM PULMONARY BILATERAL SELECTIVE  08/23/2016  . IR ANGIOGRAM SELECTIVE EACH ADDITIONAL VESSEL  08/23/2016  . IR ANGIOGRAM SELECTIVE EACH ADDITIONAL VESSEL  08/23/2016  . IR INFUSION THROMBOL ARTERIAL INITIAL (MS)  08/23/2016  . IR INFUSION THROMBOL ARTERIAL INITIAL (MS)  08/23/2016  . IR THROMB F/U EVAL ART/VEN FINAL DAY (MS)  08/24/2016  . IR US GUIDE VASC ACCESS RIGHT  08/23/2016  . KIDNEY SURGERY      Prior to Admission medications   Medication Sig Start Date End Date Taking? Authorizing Provider  albuterol (PROAIR HFA) 108 (90 Base) MCG/ACT inhaler Inhale 2 puffs into the lungs every 4 (four) hours as needed for wheezing or shortness of breath.    [provider]  amLODipine (NORVASC) 10 MG tablet Take 10 mg by mouth daily.  05/31/13   [provider]  amLODipine (NORVASC) 10 MG tablet Take by mouth.    [provider]  aspirin 325 MG tablet Take by mouth.    [provider]  aspirin 81 MG chewable tablet  Chew 81 mg by mouth daily.    [provider]  atorvastatin (LIPITOR) 40 MG tablet Take by mouth.    [provider]  buPROPion (WELLBUTRIN SR) 100 MG 12 hr tablet Take 100 mg by mouth daily.  05/31/13   [provider]  buPROPion (WELLBUTRIN SR) 100 MG 12 hr tablet Take by mouth.    [provider]  carbamazepine (TEGRETOL XR) 200 MG 12 hr tablet Take by mouth.    [provider]  celecoxib (CELEBREX) 200 MG capsule Take by mouth.    [provider]  cephALEXin (KEFLEX) 500 MG capsule Take 1 capsule (500 mg total) by mouth 4 (four) times daily. 10/29/16   Henreitta Leber, MD  clopidogrel (PLAVIX) 75 MG tablet Take by mouth.    [provider]  doxepin (SINEQUAN) 10 MG capsule Take  10 mg by mouth at bedtime.    [provider]  fluticasone (FLONASE) 50 MCG/ACT nasal spray Place 1 spray into both nostrils daily.    [provider]  Fluticasone-Salmeterol (ADVAIR) 100-50 MCG/DOSE AEPB Inhale 1 puff into the lungs 2 (two) times daily.    [provider]  furosemide (LASIX) 40 MG tablet Take 40 mg by mouth 2 (two) times daily.    [provider]  gabapentin (NEURONTIN) 400 MG capsule Take by mouth.    [provider]  ipratropium-albuterol (DUONEB) 0.5-2.5 (3) MG/3ML SOLN Inhale 3 mLs into the lungs every 4 (four) hours as needed (shortness of breath/wheezing).  05/01/16   [provider]  iron polysaccharides (NIFEREX) 150 MG capsule Take 150 mg by mouth daily.    [provider]  iron polysaccharides (NIFEREX) 150 MG capsule Take by mouth.    [provider]  isosorbide mononitrate (IMDUR) 60 MG 24 hr tablet Take by mouth.    [provider]  levothyroxine (SYNTHROID, LEVOTHROID) 100 MCG tablet Take 100 mcg by mouth daily before breakfast.  05/31/13   [provider]  levothyroxine (SYNTHROID, LEVOTHROID) 100 MCG tablet Take by mouth.    [provider]  LORazepam (ATIVAN) 2 MG tablet Take by mouth.    [provider]  meclizine (ANTIVERT) 25 MG tablet Take 25 mg by mouth 2 (two) times daily as needed for dizziness.  05/31/13   [provider]  metolazone (ZAROXOLYN) 5 MG tablet Take 5 mg by mouth daily.    [provider]  metoprolol (LOPRESSOR) 50 MG tablet Take 50 mg by mouth 2 (two) times daily.  05/31/13   [provider]  metoprolol tartrate (LOPRESSOR) 50 MG tablet Take by mouth.    [provider]  mometasone (NASONEX) 50 MCG/ACT nasal spray Place into the nose.    [provider]  montelukast (SINGULAIR) 10 MG tablet Take 1 tablet by mouth at bedtime.     [provider]  montelukast (SINGULAIR) 10 MG tablet Take by mouth.    [provider]  pantoprazole (PROTONIX) 40 MG tablet Take 40 mg by mouth daily.     [provider]  pantoprazole (PROTONIX) 40 MG tablet Take by mouth.    [provider]  Potassium Chloride CRYS Use 20 mEq once daily.    [provider]  potassium chloride SA (K-DUR,KLOR-CON) 20 MEQ tablet Take by mouth.    [provider]  ramipril (ALTACE) 10 MG capsule Take by mouth.    [provider]  ranolazine (RANEXA) 500 MG 12 hr tablet Take by mouth.    [provider]  rivaroxaban (XARELTO) 20 MG TABS tablet Take 1 tablet (20 mg total) by mouth daily with supper. 09/23/16   Bettey Costa, MD  senna-docusate (SENOKOT-S) 8.6-50 MG tablet Take 2 tablets by mouth daily.    [provider]  Suvorexant (BELSOMRA) 10 MG TABS Take 10 mg by mouth at bedtime.    [provider]  triamcinolone cream (KENALOG) 0.1 % Apply topically.    [provider]  ziprasidone (GEODON) 60 MG capsule Take by mouth.    [provider]  ziprasidone (GEODON) 80 MG capsule Take 80 mg by mouth 2 (two) times daily with a meal.    [provider]    Allergies Dilaudid  [hydromorphone hcl]; Hydrocodone-acetaminophen; Hydromorphone; Morphine and related; Tetracycline; Tetracyclines & related; Penicillins; and Sulfa antibiotics  Family History  Problem Relation Age  of Onset  . Diabetes Mother   . Heart attack Father   . Diabetes Brother   . Heart attack Brother     Social History Social History  Substance Use Topics  . Smoking status: Former Smoker    Quit date: 03/15/1963  . Smokeless tobacco: Never Used  . Alcohol use No    Review of Systems Constitutional: No fever/chills Eyes: No visual changes. ENT: No sore throat. Cardiovascular: +chest pain. Respiratory: Acute on chronic shortness of breath. Gastrointestinal: No abdominal pain.  No nausea, no vomiting.  No diarrhea.  No constipation. Genitourinary: Negative for dysuria. Musculoskeletal: Negative for neck pain.  Negative for back pain. Integumentary: Negative for rash.  Chronic wound in right lower extremity Neurological: Negative for headaches, focal weakness or numbness.   ____________________________________________   PHYSICAL EXAM:  VITAL SIGNS: ED Triage Vitals  Enc Vitals Group     BP 11/21/16 0452 102/60     Pulse Rate 11/21/16 0452 77     Resp 11/21/16 0452 19     Temp 11/21/16 0452 98.1 F (36.7 C)     Temp Source 11/21/16 0452 Oral     SpO2 11/21/16 0451 97 %     Weight 11/21/16 0452 (!) 150.1 kg (331 lb)     Height 11/21/16 0452 1.702 m (5\' 7" )     Head Circumference --      Peak Flow --      Pain Score 11/21/16 0451 5     Pain Loc --      Pain Edu? --      Excl. in [place]? --     Constitutional: Alert and oriented. Appears chronically ill but not in acute distress at this time Eyes: Conjunctivae are normal.  Head: Atraumatic. Nose: No congestion/rhinnorhea. Mouth/Throat: Mucous membranes are moist. Neck: No stridor.  No meningeal signs.   Cardiovascular: Normal rate, regular rhythm. Good peripheral circulation. Grossly normal heart sounds. Respiratory:  Increased respiratory effort.  Mild accessory muscle usage.  Lungs sounds are clear but auscultation is limited by body habitus Gastrointestinal: Mildly obese.  Soft and nontender. No distention.  Neurologic:  Normal speech and language. No gross focal neurologic deficits are appreciated.  Skin:  Skin is pale, warm, dry and intact except for chronic oozing wound of the right lower leg.  I looked under the bandage and it looks stable and not cutely infected at this time and there is no surrounding cellulitis.   ____________________________________________   LABS (all labs ordered are listed, but only abnormal results are displayed)  Labs Reviewed  BASIC METABOLIC PANEL - Abnormal; Notable for the following:       Result Value   Sodium 133 (*)    Potassium 3.1 (*)    Chloride 95 (*)    Glucose, Bld 270 (*)    BUN 28 (*)    Creatinine, Ser 1.75 (*)    Calcium 8.8 (*)    GFR calc non Af Amer 37 (*)    GFR calc Af Amer 43 (*)    All other components within normal limits  CBC - Abnormal; Notable for the following:    Hemoglobin 11.0 (*)    HCT 35.1 (*)    MCV 78.1 (*)    MCH 24.5 (*)    MCHC 31.4 (*)    RDW 19.2 (*)    All other components within normal limits  TROPONIN I - Abnormal; Notable for the following:    Troponin I 0.04 (*)  All other components within normal limits  BRAIN NATRIURETIC PEPTIDE - Abnormal; Notable for the following:    B Natriuretic Peptide 168.0 (*)    All other components within normal limits  HEPATIC FUNCTION PANEL - Abnormal; Notable for the following:    Albumin 3.0 (*)    AST 68 (*)    ALT 115 (*)    Bilirubin, Direct <0.1 (*)    All other components within normal limits  LIPASE, BLOOD   ____________________________________________  EKG  ED ECG REPORT I, Binnie Vonderhaar, the attending physician, personally viewed and interpreted this ECG.  Date: 11/21/2016 EKG Time: 04:45 Rate: 78 Rhythm: sinus with 1st degree AV block QRS Axis:  normal Intervals: prolonged PR interval, prolonged QTc interval at 527 ms ST/T Wave abnormalities: normal Narrative Interpretation: Non-specific ST segment / T-wave changes, but no evidence of acute ischemia.   ____________________________________________  RADIOLOGY   Dg Chest Port 1 View  Result Date: 11/21/2016 CLINICAL DATA:  71 y/o  M; chest pain. EXAM: PORTABLE CHEST 1 VIEW COMPARISON:  10/08/2016 chest radiograph. FINDINGS: Stable cardiomegaly. Stable elevated right hemidiaphragm and right basilar opacity probably representing associated atelectasis. Post median sternotomy with three upper broken wires unchanged from prior radiographs. Aortic atherosclerosis with calcification. No new focal consolidation. Bones are unremarkable. IMPRESSION: Stable cardiomegaly and elevated right hemidiaphragm with right basilar atelectasis. No new acute pulmonary process identified. Electronically Signed   By: Kristine Garbe M.D.   On: 11/21/2016 05:31    ____________________________________________   PROCEDURES  Critical Care performed: No   Procedure(s) performed:   Procedures   ____________________________________________   INITIAL IMPRESSION / ASSESSMENT AND PLAN / ED COURSE  Pertinent labs & imaging results that were available during my care of the patient were reviewed by me and considered in my medical decision making (see chart for details).  Patient has numerous medical comorbidities and has recently been admitted for sepsis.  He does have a history of unstable angina/chest pain and I suspect today's presentation is not representative of ACS.  However given his numerous comorbidities, the radiation to his left arm, and increased shortness of breath, I feel that he would benefit from chest pain observation admission and potentially by assessment of cardiology.  His regular cardiologist is Dr. Laurelyn Sickle.  I asked the patient what he wants to do and he states he feels he needs  to stay in the hospital but it is unclear if this is due to his acute or chronic issues.  I discussed the case with Dr. Marcille Blanco at approximately 6:15 AM and he will admit or pass along the admission to the daytime hospitalist.      ____________________________________________  FINAL CLINICAL IMPRESSION(S) / ED DIAGNOSES  Final diagnoses:  Chest pain, unspecified type     MEDICATIONS GIVEN DURING THIS VISIT:  Medications - No data to display   NEW OUTPATIENT MEDICATIONS STARTED DURING THIS VISIT:  New Prescriptions   No medications on file    Modified Medications   No medications on file    Discontinued Medications   No medications on file     Note:  This document was prepared using Dragon voice recognition software and may include unintentional dictation errors.    Hinda Kehr, MD 11/21/16 [phone]

## 2016-11-21 NOTE — ED Triage Notes (Signed)
Pt presents to ED 11 via EMS from [facility] c/o chest pain that started today; pt states given one pill of nitro and 4 baby aspirins by staff of the nursing home; pt states pain is radiating to the left arm, pt denies any LOC change, any nausea, vomiting, diarrhea, or vision changes; pt has hx of CHF, diabetes, HTN, asthma and was on c-pap; pt is awake, alert and oriented x4.

## 2016-11-21 NOTE — Progress Notes (Signed)
Ch. Respond to OR for prayer. Pt. Very emotional. Strained relationship with daughter who is married to a [place] which he does not approved. [place] prayer and emotional support.

## 2016-11-22 ENCOUNTER — Observation Stay: Admit: 2016-11-22 | Payer: Medicare Other

## 2016-11-22 DIAGNOSIS — R079 Chest pain, unspecified: Secondary | ICD-10-CM | POA: Diagnosis not present

## 2016-11-22 LAB — CBC
HCT: 32.6 % — ABNORMAL LOW (ref 40.0–52.0)
HEMOGLOBIN: 10.4 g/dL — AB (ref 13.0–18.0)
MCH: 24.7 pg — ABNORMAL LOW (ref 26.0–34.0)
MCHC: 32 g/dL (ref 32.0–36.0)
MCV: 77.1 fL — ABNORMAL LOW (ref 80.0–100.0)
Platelets: 338 10*3/uL (ref 150–440)
RBC: 4.23 MIL/uL — ABNORMAL LOW (ref 4.40–5.90)
RDW: 18.5 % — AB (ref 11.5–14.5)
WBC: 6.8 10*3/uL (ref 3.8–10.6)

## 2016-11-22 LAB — BASIC METABOLIC PANEL
ANION GAP: 8 (ref 5–15)
BUN: 30 mg/dL — AB (ref 6–20)
CALCIUM: 8.6 mg/dL — AB (ref 8.9–10.3)
CO2: 30 mmol/L (ref 22–32)
Chloride: 94 mmol/L — ABNORMAL LOW (ref 101–111)
Creatinine, Ser: 1.55 mg/dL — ABNORMAL HIGH (ref 0.61–1.24)
GFR calc Af Amer: 50 mL/min — ABNORMAL LOW (ref >60)
GFR calc non Af Amer: 43 mL/min — ABNORMAL LOW (ref >60)
GLUCOSE: 237 mg/dL — AB (ref 65–99)
Potassium: 3 mmol/L — ABNORMAL LOW (ref 3.5–5.1)
Sodium: 132 mmol/L — ABNORMAL LOW (ref 135–145)

## 2016-11-22 MED ORDER — TRAMADOL HCL 50 MG PO TABS
50.0000 mg | ORAL_TABLET | Freq: Two times a day (BID) | ORAL | Status: DC | PRN
  Administered 2016-11-22: 50 mg via ORAL
  Filled 2016-11-22: qty 1

## 2016-11-22 MED ORDER — ONDANSETRON HCL 4 MG/5ML PO SOLN: 4.0000 mg | Freq: Four times a day (QID) | ORAL | Status: DC | PRN

## 2016-11-22 MED ORDER — ONDANSETRON HCL 4 MG/2ML IJ SOLN
4.0000 mg | Freq: Four times a day (QID) | INTRAMUSCULAR | Status: DC | PRN
  Administered 2016-11-22: 4 mg via INTRAVENOUS
  Filled 2016-11-22: qty 2

## 2016-11-22 MED ORDER — ONDANSETRON HCL 4 MG PO TABS: 4.0000 mg | ORAL_TABLET | Freq: Four times a day (QID) | ORAL | Status: DC | PRN

## 2016-11-22 NOTE — Progress Notes (Signed)
Patient is complaining of nausea at this time. Dr. Marcille Blanco notified in person and received new order for Zofran 4 mg IV/oral as needed for nausea. Will continue to monitor.

## 2016-11-22 NOTE — Progress Notes (Signed)
Patient woke  at [place] and c/o chest pain of 5/10 on a pain scale. Dr. Jannifer Franklin notified with a new order for Tramadol  50 mg oral as needed for moderate pain. Patient is eating sandwich at this moment after taking the medication. No acute distress noted. Will continue to monitor.

## 2016-11-22 NOTE — Progress Notes (Signed)
SUBJECTIVE: Pt is feeling well this morning. He had an episode of chest pain 5/10 which was relieved with Tramadol.    Vitals:   11/21/16 0929 11/21/16 2022 11/22/16 0112 11/22/16 0431  BP: (!) 133/51 (!) 114/58 (!) 97/46 (!) 116/51  Pulse: 77 80 71 73  Resp: 16 18 18 18   Temp: 98.5 F (36.9 C) 98.4 F (36.9 C) 98.4 F (36.9 C) 98.2 F (36.8 C)  TempSrc: Oral Oral Oral Oral  SpO2: 97% 95% 97% 95%  Weight: (!) 346 lb 9.6 oz (157.2 kg)     Height: 5\' 7"  (1.702 m)       Intake/Output Summary (Last 24 hours) at 11/22/16 0744 Last data filed at 11/21/16 1130  Gross per 24 hour  Intake                0 ml  Output                0 ml  Net                0 ml    LABS: Basic Metabolic Panel:  Recent Labs  11/21/16 0500 11/22/16 0342  NA 133* 132*  K 3.1* 3.0*  CL 95* 94*  CO2 32 30  GLUCOSE 270* 237*  BUN 28* 30*  CREATININE 1.75* 1.55*  CALCIUM 8.8* 8.6*   Liver Function Tests:  Recent Labs  11/21/16 0500  AST 68*  ALT 115*  ALKPHOS 84  BILITOT 0.5  PROT 6.9  ALBUMIN 3.0*    Recent Labs  11/21/16 0500  LIPASE 35   CBC:  Recent Labs  11/21/16 0500 11/22/16 0342  WBC 7.3 6.8  HGB 11.0* 10.4*  HCT 35.1* 32.6*  MCV 78.1* 77.1*  PLT 352 338   Cardiac Enzymes:  Recent Labs  11/21/16 0500 11/21/16 1051 11/21/16 1709  TROPONINI 0.04* 0.03* 0.03*   BNP: Invalid input(s): POCBNP D-Dimer: No results for input(s): DDIMER in the last 72 hours. Hemoglobin A1C: No results for input(s): HGBA1C in the last 72 hours. Fasting Lipid Panel: No results for input(s): CHOL, HDL, LDLCALC, TRIG, CHOLHDL, LDLDIRECT in the last 72 hours. Thyroid Function Tests: No results for input(s): TSH, T4TOTAL, T3FREE, THYROIDAB in the last 72 hours.  Invalid input(s): FREET3 Anemia Panel: No results for input(s): VITAMINB12, FOLATE, FERRITIN, TIBC, IRON, RETICCTPCT in the last 72 hours.   PHYSICAL EXAM General: Well developed, well nourished, in no acute  distress HEENT:  Normocephalic and atramatic Neck:  No JVD.  Lungs: Clear bilaterally to auscultation and percussion. Heart: HRRR . Normal S1 and S2 without gallops or murmurs.  Abdomen: Bowel sounds are positive, abdomen soft and non-tender. Left chest wall tender to palpation. Msk:  Back normal, normal gait. Normal strength and tone for age. Extremities: 2+ non pitting edema   Neuro: Alert and oriented X 3. Psych:  Good affect, responds appropriately  TELEMETRY: NSR 72bpm  ASSESSMENT AND PLAN: CHF exacerbation with mildly elevated troponin likely due to demand ischemia. Pt had chest pain episode last night relieved by tramadol. Pain is likely musculoskeletal as pain is reproducible to palpation of chest wall. Continue diuresis and replacing potassium. Pain management as needed.   Active Problems:   Chest pain    Jake Bathe, NP-C 11/22/2016 7:44 AM

## 2016-11-22 NOTE — Consult Note (Signed)
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  Subsequent hematoma was evacuated.  Patient is followed weekly at the outpatient [facility] at [facility]. Wound type:Traumatic, surgical Pressure Injury POA: No Measurement: Surface wound measures 2cm x 2cm x 0.4cm and wound bed is pink, non-granulating and moist with serosanguinous exudate. There is an undermined area at 6 o'clock measuring 5.5cm x 1.4cm that is not visible and that has been filled previously. Wound bed:As described above Drainage (amount, consistency, odor) Thick, red/brown drainage in a moderate amount. There is a small amount of drainage strike-through onto the exterior dressing noted. Periwound:Intact, dry Dressing procedure/placement/frequency: Patient known to our service from previous admissions and progress is being made. Wound care orders provided today for Nursing staff include cleansing with NS, and filling of defect with silver hydrofiber dressing to be changed daily. Patient will continue with follow up at the [facility] outpatient [facility] post discharge. Thank you for this consultation to guide inpatient wound care. [place] nursing team will not follow, but will remain available to this patient, the nursing and medical teams.  Please re-consult if needed. Thanks, Maudie Flakes, MSN, RN, [place], Arther Abbott  Pager# [phone]

## 2016-11-22 NOTE — Progress Notes (Signed)
Patient prepared for transport to [facility]. VSS, IV and tele removed. Report called to [facility] and EMS. No distress at this time.

## 2016-11-22 NOTE — Clinical Social Work Note (Signed)
CSW received notification from the attending RN that the patient will discharge today. The patient's facility is aware and in agreement. CSW will deliver packet once discharge summary has been entered. The patient will transport via non-emergent EMS. Please alert EMS of the patient's weight as they will require a bariatric stretcher. CSW will continue to follow.  Santiago Bumpers, MSW, Latanya Presser [phone]

## 2016-11-22 NOTE — Discharge Summary (Signed)
Physician Discharge Summary  Patient ID: [name] MRN: [id number] DOB/AGE: 12/14/1945 71 y.o.  Admit date: 11/21/2016 Discharge date: 11/22/2016  Admission Diagnoses: 1. Chest pain. 2. Acute on chronic renal failure.  3. Elevated troponin 4. COPD.  5. Open wound right knee present on admission Discharge Diagnoses: 1. Chest pain 2. Acute on chronic renal failure 3. Elevated troponin 4. COPD 5. Open wound right knee present on admission Active Problems:   Chest pain   Discharged Condition: stable  Hospital Course: 1. Chest pain. Patient had mildly elevated troponin. However trended down to normal. The possible demand ischemia as evaluated by cardiology. Today he had some chest wall pain is reproducible. No further cardiac workup at this time.  2. Acute on chronic renal failure. patient was given mild IV hydration. Creatinine returned to baseline. Recommend rechecking renal function follow-up. 3. Elevated troponin. Only mild. Cardiology evaluated him and felt this was demand ischemia. Will see him in follow-up in the office but no further inpatient workup.  4. COPD. stable during hospital stay 5. Obstructive sleep apnea. He does wear CPAP at home. We'll continue this in the hospital. 6. Hypokalemia. Potassium was 3.0 at time of discharge. He was given 40 mg orally and will continue 40 mg a day. Recommend rechecking potassium and follow-up on Tuesday  Disposition. Patient is being discharged at [facility]. He will follow-up with Dr. Yancey [name], cardiologist, on Tuesday, July 2 at 10 AM  Total time spent 35 minutes   Consults: cardiology  Significant Diagnostic Studies: labs: n  Treatments: IV hydration  Discharge Exam: Blood pressure (!) 116/57, pulse 72, temperature 97.7 F (36.5 C), temperature source Oral, resp. rate 18, height 5\' 7"  (1.702 m), weight (!) 157.2 kg (346 lb 9.6 oz), SpO2 98 %. General appearance: no distress Resp: clear to auscultation bilaterally  and normal percussion bilaterally Chest wall: I'll tenderness left chest wall with palpation Cardio: regular rate and rhythm, S1, S2 normal, no murmur, click, rub or gallop Extremities: Right knee has open wound with no exudate  Disposition: 03-Skilled Nursing Facility  Discharge Instructions    Diet - low sodium heart healthy    Complete by:  As directed    Increase activity slowly    Complete by:  As directed      Allergies as of 11/22/2016      Reactions   Dilaudid [hydromorphone Hcl] Other (See Comments)   "CANNOT HEAR, CANNOT THINK"   Hydrocodone-acetaminophen Nausea And Vomiting   Hydromorphone Nausea Only   Vomiting, diarrhea.   Morphine And Related Other (See Comments)   Patient was told by a doctor that this "would kill" him   Tetracycline Other (See Comments)   Patient was told by a doctor that this "would kill" him   Tetracyclines & Related Other (See Comments)   Patient doesn't recall the reaction   Penicillins Rash   Has patient had a PCN reaction causing immediate rash, facial/tongue/throat swelling, SOB or lightheadedness with hypotension: Yes Has patient had a PCN reaction causing severe rash involving mucus membranes or skin necrosis: No Has patient had a PCN reaction that required hospitalization No Has patient had a PCN reaction occurring within the last 10 years: No If all of the above answers are "NO", then may proceed with Cephalosporin use.   Sulfa Antibiotics Rash      Medication List    STOP taking these medications   ciprofloxacin 500 MG tablet Commonly known as:  CIPRO     TAKE these  medications   acetaminophen 325 MG tablet Commonly known as:  TYLENOL Take 650 mg by mouth every 4 (four) hours as needed (general discomfort).   amLODipine 10 MG tablet Commonly known as:  NORVASC Take 10 mg by mouth daily.   aspirin 81 MG EC tablet Chew 81 mg by mouth daily.   BELSOMRA 10 MG Tabs Generic drug:  Suvorexant Take 10 mg by mouth at  bedtime.   buPROPion 100 MG 12 hr tablet Commonly known as:  WELLBUTRIN SR Take 100 mg by mouth daily.   colchicine 0.6 MG tablet Take 0.6 mg by mouth daily.   doxepin 25 MG capsule Commonly known as:  SINEQUAN Take 25 mg by mouth at bedtime.   doxycycline 100 MG tablet Commonly known as:  VIBRA-TABS Take 100 mg by mouth 2 (two) times daily.   febuxostat 40 MG tablet Commonly known as:  ULORIC Take 40 mg by mouth daily.   fluticasone 50 MCG/ACT nasal spray Commonly known as:  FLONASE Place 1 spray into both nostrils daily.   Fluticasone-Salmeterol 100-50 MCG/DOSE Aepb Commonly known as:  ADVAIR Inhale 1 puff into the lungs 2 (two) times daily.   furosemide 40 MG tablet Commonly known as:  LASIX Take 40 mg by mouth 2 (two) times daily.   ipratropium-albuterol 0.5-2.5 (3) MG/3ML Soln Commonly known as:  DUONEB Inhale 3 mLs into the lungs every 4 (four) hours as needed (shortness of breath/wheezing).   iron polysaccharides 150 MG capsule Commonly known as:  NIFEREX Take 150 mg by mouth daily.   levothyroxine 100 MCG tablet Commonly known as:  SYNTHROID, LEVOTHROID Take 100 mcg by mouth daily before breakfast.   meclizine 25 MG tablet Commonly known as:  ANTIVERT Take 25 mg by mouth 2 (two) times daily as needed for dizziness.   metolazone 5 MG tablet Commonly known as:  ZAROXOLYN Take 5 mg by mouth daily.   metoprolol tartrate 50 MG tablet Commonly known as:  LOPRESSOR Take 50 mg by mouth 2 (two) times daily.   montelukast 10 MG tablet Commonly known as:  SINGULAIR Take 1 tablet by mouth at bedtime.   pantoprazole 40 MG tablet Commonly known as:  PROTONIX Take 40 mg by mouth daily.   potassium chloride SA 20 MEQ tablet Commonly known as:  K-DUR,KLOR-CON Take 40 mEq by mouth daily.   PROAIR HFA 108 (90 Base) MCG/ACT inhaler Generic drug:  albuterol Inhale 2 puffs into the lungs every 4 (four) hours as needed for wheezing or shortness of breath.    rivaroxaban 20 MG Tabs tablet Commonly known as:  XARELTO Take 1 tablet (20 mg total) by mouth daily with supper.   senna-docusate 8.6-50 MG tablet Commonly known as:  Senokot-S Take 2 tablets by mouth daily.   ziprasidone 80 MG capsule Commonly known as:  GEODON Take 80 mg by mouth 2 (two) times daily with a meal.      Follow-up Information    Dionisio David, MD. Go to.   Specialty:  Cardiology Why:  Appointment for 10 AM on July 2. Contact information: [place] [address] [phone]        Dionisio David, MD .   Specialty:  Cardiology Contact information: [address] [place] [address] [phone]           Signed: Baxter Hire 11/22/2016, 10:58 AM

## 2016-11-23 ENCOUNTER — Encounter: Payer: Medicare Other | Attending: Surgery | Admitting: Surgery

## 2016-11-23 DIAGNOSIS — J449 Chronic obstructive pulmonary disease, unspecified: Secondary | ICD-10-CM | POA: Diagnosis not present

## 2016-11-23 DIAGNOSIS — E11622 Type 2 diabetes mellitus with other skin ulcer: Secondary | ICD-10-CM | POA: Insufficient documentation

## 2016-11-23 DIAGNOSIS — I2510 Atherosclerotic heart disease of native coronary artery without angina pectoris: Secondary | ICD-10-CM | POA: Diagnosis not present

## 2016-11-23 DIAGNOSIS — L89892 Pressure ulcer of other site, stage 2: Secondary | ICD-10-CM | POA: Diagnosis not present

## 2016-11-23 DIAGNOSIS — Z951 Presence of aortocoronary bypass graft: Secondary | ICD-10-CM | POA: Diagnosis not present

## 2016-11-23 DIAGNOSIS — M12561 Traumatic arthropathy, right knee: Secondary | ICD-10-CM | POA: Diagnosis not present

## 2016-11-23 DIAGNOSIS — D649 Anemia, unspecified: Secondary | ICD-10-CM | POA: Insufficient documentation

## 2016-11-23 DIAGNOSIS — Z7982 Long term (current) use of aspirin: Secondary | ICD-10-CM | POA: Insufficient documentation

## 2016-11-23 DIAGNOSIS — Z7902 Long term (current) use of antithrombotics/antiplatelets: Secondary | ICD-10-CM | POA: Insufficient documentation

## 2016-11-23 DIAGNOSIS — M71061 Abscess of bursa, right knee: Secondary | ICD-10-CM | POA: Diagnosis not present

## 2016-11-23 DIAGNOSIS — I5032 Chronic diastolic (congestive) heart failure: Secondary | ICD-10-CM | POA: Insufficient documentation

## 2016-11-23 DIAGNOSIS — L97812 Non-pressure chronic ulcer of other part of right lower leg with fat layer exposed: Secondary | ICD-10-CM | POA: Diagnosis not present

## 2016-11-23 DIAGNOSIS — I110 Hypertensive heart disease with heart failure: Secondary | ICD-10-CM | POA: Diagnosis not present

## 2016-11-23 DIAGNOSIS — Z86711 Personal history of pulmonary embolism: Secondary | ICD-10-CM | POA: Insufficient documentation

## 2016-11-24 NOTE — Progress Notes (Signed)
[name], [name] ([id number]) Visit Report for 11/23/2016 Arrival Information Details Patient Name: [name], [name]. Date of Service: 11/23/2016 8:45 AM Medical Record Number: [id number] Patient Account Number: [id number] Date of Birth/Sex: Jul 05, 1945 (71 y.o. Male) Treating RN: [name] Primary Care [name]: [name] Other Clinician: Referring [name]: [name] Treating [name]/Extender: [name] in Treatment: 14 Visit Information History Since Last Visit All ordered tests and consults were completed: No Patient Arrived: Wheel Chair Added or deleted any medications: No Arrival Time: 09:10 Any new allergies or adverse reactions: No Accompanied By: self Had a fall or experienced change in No Transfer Assistance: EasyPivot activities of daily living that may affect Patient Lift risk of falls: Patient Identification Verified: Yes Signs or symptoms of abuse/neglect since last No Secondary Verification Process Yes visito Completed: Hospitalized since last visit: No Patient Requires Transmission- No Has Dressing in Place as Prescribed: Yes Based Precautions: Pain Present Now: Yes Patient Has Alerts: No Electronic Signature(s) Signed: 11/23/2016 4:51:42 PM By: [name] Entered By: [name] on 11/23/2016 09:10:56 [name], [name] ([id number]) -------------------------------------------------------------------------------- Clinic Level of Care Assessment Details Patient Name: [name]. Date of Service: 11/23/2016 8:45 AM Medical Record Number: [id number] Patient Account Number: [id number] Date of Birth/Sex: Jan 05, 1946 (71 y.o. Male) Treating RN: [name] Primary Care [name]: [name] Other Clinician: Referring [name]: [name] Treating [name]/Extender: [name] in Treatment: 14 Clinic Level of Care Assessment Items TOOL 4 Quantity Score X - Use when only an EandM is performed on  FOLLOW-UP visit 1 0 ASSESSMENTS - Nursing Assessment / Reassessment X - Reassessment of Co-morbidities (includes updates in patient status) 1 10 X - Reassessment of Adherence to Treatment Plan 1 5 ASSESSMENTS - Wound and Skin Assessment / Reassessment X - Simple Wound Assessment / Reassessment - one wound 1 5 []  - Complex Wound Assessment / Reassessment - multiple wounds 0 []  - Dermatologic / Skin Assessment (not related to wound area) 0 ASSESSMENTS - Focused Assessment []  - Circumferential Edema Measurements - multi extremities 0 []  - Nutritional Assessment / Counseling / Intervention 0 []  - Lower Extremity Assessment (monofilament, tuning fork, pulses) 0 []  - Peripheral Arterial Disease Assessment (using hand held doppler) 0 ASSESSMENTS - Ostomy and/or Continence Assessment and Care []  - Incontinence Assessment and Management 0 []  - Ostomy Care Assessment and Management (repouching, etc.) 0 PROCESS - Coordination of Care []  - Simple Patient / Family Education for ongoing care 0 X - Complex (extensive) Patient / Family Education for ongoing care 1 20 X - Staff obtains Programmer, systems, Records, Test Results / Process Orders 1 10 X - Staff telephones HHA, Nursing Homes / Clarify orders / etc 1 10 []  - Routine Transfer to another Facility (non-emergent condition) 0 [name], Tyray T. ([id number]) []  - Routine Hospital Admission (non-emergent condition) 0 []  - New Admissions / Biomedical engineer / Ordering NPWT, Apligraf, etc. 0 []  - Emergency Hospital Admission (emergent condition) 0 X - Simple Discharge Coordination 1 10 []  - Complex (extensive) Discharge Coordination 0 PROCESS - Special Needs []  - Pediatric / Minor Patient Management 0 []  - Isolation Patient Management 0 []  - Hearing / Language / Visual special needs 0 []  - Assessment of Community assistance (transportation, D/C planning, etc.) 0 []  - Additional assistance / Altered mentation 0 []  - Support Surface(s) Assessment (bed,  cushion, seat, etc.) 0 INTERVENTIONS - Wound Cleansing / Measurement X - Simple Wound Cleansing - one wound 1 5 []  - Complex Wound Cleansing - multiple wounds 0  X - Wound Imaging (photographs - any number of wounds) 1 5 []  - Wound Tracing (instead of photographs) 0 X - Simple Wound Measurement - one wound 1 5 []  - Complex Wound Measurement - multiple wounds 0 INTERVENTIONS - Wound Dressings []  - Small Wound Dressing one or multiple wounds 0 X - Medium Wound Dressing one or multiple wounds 1 15 []  - Large Wound Dressing one or multiple wounds 0 X - Application of Medications - topical 1 5 []  - Application of Medications - injection 0 INTERVENTIONS - Miscellaneous []  - External ear exam 0 [name], [name] ([id number]) []  - Specimen Collection (cultures, biopsies, blood, body fluids, etc.) 0 []  - Specimen(s) / Culture(s) sent or taken to Lab for analysis 0 []  - Patient Transfer (multiple staff / Harrel Lemon Lift / Similar devices) 0 []  - Simple Staple / Suture removal (25 or less) 0 []  - Complex Staple / Suture removal (26 or more) 0 []  - Hypo / Hyperglycemic Management (close monitor of Blood Glucose) 0 []  - Ankle / Brachial Index (ABI) - do not check if billed separately 0 X - Vital Signs 1 5 Has the patient been seen at the hospital within the last three years: Yes Total Score: 110 Level Of Care: New/Established - Level 3 Electronic Signature(s) Signed: 11/23/2016 4:51:42 PM By: [name] Entered By: [name] on 11/23/2016 11:35:01 [name], [name] ([id number]) -------------------------------------------------------------------------------- Encounter Discharge Information Details Patient Name: [name] Gathers T. Date of Service: 11/23/2016 8:45 AM Medical Record Number: [id number] Patient Account Number: [id number] Date of Birth/Sex: July 20, 1945 (71 y.o. Male) Treating RN: [name] Primary Care Aja Bolander: [name] Other Clinician: Referring Kymoni Lesperance:  [name] Treating Rosslyn Pasion/Extender: [name] in Treatment: 57 Encounter Discharge Information Items Discharge Pain Level: 0 Discharge Condition: Stable Ambulatory Status: Wheelchair Discharge Destination: Nursing Home Transportation: Other Accompanied By: self Schedule Follow-up Appointment: Yes Medication Reconciliation completed No and provided to Patient/Care Jaris Kohles: Provided on Clinical Summary of Care: 11/23/2016 Form Type Recipient Paper Patient JE Electronic Signature(s) Signed: 11/23/2016 9:49:06 AM By: Ruthine Dose Entered By: Ruthine Dose on 11/23/2016 09:49:06 Matsushita, [name] ([id number]) -------------------------------------------------------------------------------- Lower Extremity Assessment Details Patient Name: [name] Gathers T. Date of Service: 11/23/2016 8:45 AM Medical Record Number: [id number] Patient Account Number: [id number] Date of Birth/Sex: [birth year] (71 y.o. Male) Treating RN: [name] Primary Care Angline Schweigert: [name] Other Clinician: Referring Claressa Hughley: [name] Treating Anae Hams/Extender: [name] in Treatment: 14 Vascular Assessment Pulses: Dorsalis Pedis Palpable: [Right:Yes] Posterior Tibial Extremity colors, hair growth, and conditions: Extremity Color: [Right:Hyperpigmented] Temperature of Extremity: [Right:Warm] Capillary Refill: [Right:> 3 seconds] Electronic Signature(s) Signed: 11/23/2016 4:51:42 PM By: [name] Entered By: [name] on 11/23/2016 09:19:33 [name], [name] ([id number]) -------------------------------------------------------------------------------- Multi Wound Chart Details Patient Name: [name] Gathers T. Date of Service: 11/23/2016 8:45 AM Medical Record Number: [id number] Patient Account Number: [id number] Date of Birth/Sex: 15-Feb-1946 (71 y.o. Male) Treating RN: [name] Primary Care Iaan Oregel: [name] Other  Clinician: Referring Kaiea Esselman: [name] Treating Yeraldine Forney/Extender: [name] in Treatment: 14 Vital Signs Height(in): 67 Pulse(bpm): 78 Weight(lbs): 357 Blood Pressure 121/53 (mmHg): Body Mass Index(BMI): 56 Temperature(F): 97.7 Respiratory Rate 16 (breaths/min): Photos: [1:No Photos] [N/A:N/A] Wound Location: [1:Right Knee - Anterior] [N/A:N/A] Wounding Event: [1:Pressure Injury] [N/A:N/A] Primary Etiology: [1:Diabetic Wound/Ulcer of the Lower Extremity] [N/A:N/A] Comorbid History: [1:Anemia, Asthma, Arrhythmia, Congestive Heart Failure, Coronary Artery Disease, Hypertension, Type II Diabetes, Osteoarthritis] [N/A:N/A] Date Acquired: [1:07/23/2016] [N/A:N/A] Weeks of Treatment: [1:14] [N/A:N/A] Wound Status: [1:Open] [N/A:N/A] Measurements L x  W x D 0.3x1x0.3 [N/A:N/A] (cm) Area (cm) : [1:0.236] [N/A:N/A] Volume (cm) : [1:0.071] [N/A:N/A] % Reduction in Area: [1:97.00%] [N/A:N/A] % Reduction in Volume: 91.00% [N/A:N/A] Starting Position 1 6 (o'clock): Ending Position 1 (o'clock): Maximum Distance 1 5 (cm): Undermining: [1:Yes] [N/A:N/A] Classification: [1:Grade 1] [N/A:N/A] Exudate Amount: [1:Large] [N/A:N/A] Exudate Type: [1:Serosanguineous] [N/A:N/A] Exudate Color: red, brown N/A N/A Wound Margin: Flat and Intact N/A N/A Granulation Amount: Large (67-100%) N/A N/A Granulation Quality: Red N/A N/A Necrotic Amount: None Present (0%) N/A N/A Exposed Structures: Fat Layer (Subcutaneous N/A N/A Tissue) Exposed: Yes Fascia: No Tendon: No Muscle: No Joint: No Bone: No Epithelialization: None N/A N/A Periwound Skin Texture: Excoriation: No N/A N/A Induration: No Callus: No Crepitus: No Rash: No Scarring: No Periwound Skin Maceration: No N/A N/A Moisture: Dry/Scaly: No Periwound Skin Color: Erythema: Yes N/A N/A Atrophie Blanche: No Cyanosis: No Ecchymosis: No Hemosiderin Staining: No Mottled: No Pallor: No Rubor: No Erythema  Location: Circumferential N/A N/A Temperature: No Abnormality N/A N/A Tenderness on Yes N/A N/A Palpation: Wound Preparation: Ulcer Cleansing: N/A N/A Rinsed/Irrigated with Saline Topical Anesthetic Applied: Other: Lidocaine 4% Treatment Notes Wound #1 (Right, Anterior Knee) 1. Cleansed with: Clean wound with Normal Saline 2. Anesthetic Topical Lidocaine 4% cream to wound bed prior to debridement 4. Dressing Applied: Iodoform packing Gauze [name], [name] ([id number]) 5. Secondary Dressing Applied ABD Pad Dry Gauze Kerlix/Conform 7. Secured with Recruitment consultant) Signed: 11/23/2016 9:52:53 AM By: Christin Fudge MD, FACS Entered By: Christin Fudge on 11/23/2016 09:52:52 [name], [name] ([id number]) -------------------------------------------------------------------------------- [place] Details Patient Name: [name] Gathers T. Date of Service: 11/23/2016 8:45 AM Medical Record Number: [id number] Patient Account Number: [id number] Date of Birth/Sex: 01-15-1946 (71 y.o. Male) Treating RN: Carolyne Fiscal, Debi Primary Care Collie Wernick: [name] Other Clinician: Referring Liesel Peckenpaugh: [name] Treating Adaya Garmany/Extender: [name] in Treatment: 14 Active Inactive ` Orientation to the Wound Care Program Nursing Diagnoses: Knowledge deficit related to the wound healing center program Goals: Patient/caregiver will verbalize understanding of the [place] Program Date Initiated: 08/14/2016 Target Resolution Date: 12/14/2016 Goal Status: Active Interventions: Provide education on orientation to the wound center Notes: ` Pressure Nursing Diagnoses: Knowledge deficit related to causes and risk factors for pressure ulcer development Knowledge deficit related to management of pressures ulcers Potential for impaired tissue integrity related to pressure, friction, moisture, and shear Goals: Patient will remain free from  development of additional pressure ulcers Date Initiated: 08/14/2016 Target Resolution Date: 12/14/2016 Goal Status: Active Patient will remain free of pressure ulcers Date Initiated: 08/14/2016 Target Resolution Date: 12/14/2016 Goal Status: Active Patient/caregiver will verbalize risk factors for pressure ulcer development Date Initiated: 08/14/2016 Target Resolution Date: 12/14/2016 Goal Status: Active Patient/caregiver will verbalize understanding of pressure ulcer management Date Initiated: 08/14/2016 Target Resolution Date: 12/14/2016 Goal Status: Active [name], [name] ([id number]) Interventions: Assess: immobility, friction, shearing, incontinence upon admission and as needed Assess offloading mechanisms upon admission and as needed Assess potential for pressure ulcer upon admission and as needed Provide education on pressure ulcers Treatment Activities: Patient referred for seating evaluation to ensure proper offloading : 08/14/2016 Pressure reduction/relief device ordered : 08/14/2016 Notes: ` Wound/Skin Impairment Nursing Diagnoses: Impaired tissue integrity Knowledge deficit related to ulceration/compromised skin integrity Goals: Patient/caregiver will verbalize understanding of skin care regimen Date Initiated: 08/14/2016 Target Resolution Date: 12/14/2016 Goal Status: Active Ulcer/skin breakdown will have a volume reduction of 30% by week 4 Date Initiated: 08/14/2016 Target Resolution Date: 12/14/2016 Goal Status: Active Ulcer/skin breakdown will have  a volume reduction of 50% by week 8 Date Initiated: 08/14/2016 Target Resolution Date: 12/14/2016 Goal Status: Active Ulcer/skin breakdown will have a volume reduction of 80% by week 12 Date Initiated: 08/14/2016 Target Resolution Date: 12/14/2016 Goal Status: Active Ulcer/skin breakdown will heal within 14 weeks Date Initiated: 08/14/2016 Target Resolution Date: 12/14/2016 Goal Status: Active Interventions: Assess  patient/caregiver ability to obtain necessary supplies Assess patient/caregiver ability to perform ulcer/skin care regimen upon admission and as needed Assess ulceration(s) every visit Provide education on ulcer and skin care Treatment Activities: Referred to DME Alain Deschene for dressing supplies : 08/14/2016 Skin care regimen initiated : 08/14/2016 [name], [name] ([id number]) Topical wound management initiated : 08/14/2016 Notes: Electronic Signature(s) Signed: 11/23/2016 4:51:42 PM By: [name] Entered By: [name] on 11/23/2016 09:25:31 [name], [name] ([id number]) -------------------------------------------------------------------------------- Pain Assessment Details Patient Name: [name] Gathers T. Date of Service: 11/23/2016 8:45 AM Medical Record Number: [id number] Patient Account Number: [id number] Date of Birth/Sex: 07-16-1945 (71 y.o. Male) Treating RN: [name] Primary Care Ozell Juhasz: [name] Other Clinician: Referring Rihana Kiddy: [name] Treating Alyn Riedinger/Extender: [name] in Treatment: 14 Active Problems Location of Pain Severity and Description of Pain Patient Has Paino Yes Site Locations Pain Location: Pain in Ulcers With Dressing Change: Yes Rate the pain. Current Pain Level: 5 Character of Pain Describe the Pain: Aching, Dull Pain Management and Medication Current Pain Management: Electronic Signature(s) Signed: 11/23/2016 4:51:42 PM By: [name] Entered By: [name] on 11/23/2016 09:11:09 [name], [name] ([id number]) -------------------------------------------------------------------------------- Patient/Caregiver Education Details Patient Name: [name] Gathers T. Date of Service: 11/23/2016 8:45 AM Medical Record Number: [id number] Patient Account Number: [id number] Date of Birth/Gender: 01/11/1946 (71 y.o. Male) Treating RN: [name] Primary Care Physician: [name] Other  Clinician: Referring Physician: [name] [name] Treating Physician/Extender: [name] in Treatment: 14 Education Assessment Education Provided To: Patient Education Topics Provided Wound/Skin Impairment: Handouts: Other: change dressing as ordered Methods: Demonstration, Explain/Verbal Responses: State content correctly Electronic Signature(s) Signed: 11/23/2016 4:51:42 PM By: [name] Entered By: [name] on 11/23/2016 09:29:05 [name], [name] ([id number]) -------------------------------------------------------------------------------- Wound Assessment Details Patient Name: [name] Gathers T. Date of Service: 11/23/2016 8:45 AM Medical Record Number: [id number] Patient Account Number: [id number] Date of Birth/Sex: 1945/06/02 (71 y.o. Male) Treating RN: [name] Primary Care Fatiha Guzy: [name] Other Clinician: Referring [place] Flight: [name] Treating Manoj Enriquez/Extender: [name] in Treatment: 14 Wound Status Wound Number: 1 Primary Diabetic Wound/Ulcer of the Lower Etiology: Extremity Wound Location: Right Knee - Anterior Wound Open Wounding Event: Pressure Injury Status: Date Acquired: 07/23/2016 Comorbid Anemia, Asthma, Arrhythmia, Weeks Of Treatment: 14 History: Congestive Heart Failure, Coronary Clustered Wound: No Artery Disease, Hypertension, Type II Diabetes, Osteoarthritis Photos Photo Uploaded By: [name] on 11/23/2016 12:04:32 Wound Measurements Length: (cm) 0.3 % Reduction in Width: (cm) 1 % Reduction in Depth: (cm) 0.3 Epithelializati Area: (cm) 0.236 Undermining: Volume: (cm) 0.071 Starting Po Maximum Dist Area: 97% Volume: 91% on: None Yes sition (o'clock): 6 ance: (cm) 5 Wound Description Classification: Grade 1 Foul Odor Afte Wound Margin: Flat and Intact Slough/Fibrino Exudate Amount: Large Exudate Type: Serosanguineous Exudate Color: red, brown r Cleansing:  No No Wound Bed Granulation Amount: Large (67-100%) Exposed Structure Ridings, Refoel T. ([id number]) Granulation Quality: Red Fascia Exposed: No Necrotic Amount: None Present (0%) Fat Layer (Subcutaneous Tissue) Exposed: Yes Tendon Exposed: No Muscle Exposed: No Joint Exposed: No Bone Exposed: No Periwound Skin Texture Texture Color No Abnormalities Noted: No No Abnormalities Noted: No Callus: No Atrophie Blanche: No  Crepitus: No Cyanosis: No Excoriation: No Ecchymosis: No Induration: No Erythema: Yes Rash: No Erythema Location: Circumferential Scarring: No Hemosiderin Staining: No Mottled: No Moisture Pallor: No No Abnormalities Noted: No Rubor: No Dry / Scaly: No Maceration: No Temperature / Pain Temperature: No Abnormality Tenderness on Palpation: Yes Wound Preparation Ulcer Cleansing: Rinsed/Irrigated with Saline Topical Anesthetic Applied: Other: Lidocaine 4%, Treatment Notes Wound #1 (Right, Anterior Knee) 1. Cleansed with: Clean wound with Normal Saline 2. Anesthetic Topical Lidocaine 4% cream to wound bed prior to debridement 4. Dressing Applied: Iodoform packing Gauze 5. Secondary Dressing Applied ABD Pad Dry Gauze Kerlix/Conform 7. Secured with Recruitment consultant) Signed: 11/23/2016 4:51:42 PM By: [name] Entered By: [name] on 11/23/2016 09:18:29 Slaymaker, [name] ([id number]) Zayed, [name] ([id number]) -------------------------------------------------------------------------------- Vitals Details Patient Name: [name] Gathers T. Date of Service: 11/23/2016 8:45 AM Medical Record Number: [id number] Patient Account Number: [id number] Date of Birth/Sex: 03-Dec-1945 (71 y.o. Male) Treating RN: [name] Primary Care Lot [place]: [name] Other Clinician: Referring Nuel Dejaynes: [name] Treating Aquilla Voiles/Extender: [name] in Treatment: 14 Vital Signs Time Taken: 09:11 Temperature (F):  97.7 Height (in): 67 Pulse (bpm): 78 Weight (lbs): 357 Respiratory Rate (breaths/min): 16 Body Mass Index (BMI): 55.9 Blood Pressure (mmHg): 121/53 Reference Range: 80 - 120 mg / dl Electronic Signature(s) Signed: 11/23/2016 4:51:42 PM By: [name] Entered By: [name] on 11/23/2016 09:12:18

## 2016-11-24 NOTE — Progress Notes (Signed)
[name], [name] ([id number]) Visit Report for 7/2/[name] Chief Complaint Document Details Patient Name: [name], [name]. Date of Service: 7/2/[name] 8:45 AM Medical Record Number: [id number] Patient Account Number: [id number] Date of Birth/Sex: 1946-05-20 (71 y.o. Male) Treating RN: Ahmed Prima Primary Care Provider: [name] Dupre Other Clinician: Referring Provider: [name] Dupre Treating Provider/Extender: Frann Rider in Treatment: 14 Information Obtained from: Patient Chief Complaint Patient is at the clinic for treatment of an open pressure ulcer to the area of the right knee Electronic Signature(s) Signed: 7/2/[name] 9:53:01 AM By: Christin Fudge MD, FACS Entered By: Christin Fudge on 07/02/[name] 09:53:01 [name], [name] ([id number]) -------------------------------------------------------------------------------- HPI Details Patient Name: [name]. Date of Service: 7/2/[name] 8:45 AM Medical Record Number: [id number] Patient Account Number: [id number] Date of Birth/Sex: 1945-08-13 (71 y.o. Male) Treating RN: Ahmed Prima Primary Care Provider: [name] Dupre Other Clinician: Referring Provider: [name] Dupre Treating Provider/Extender: Frann Rider in Treatment: 14 History of Present Illness Location: right knee swelling with skin ulceration Quality: Patient reports experiencing a dull pain to affected area(s). Severity: Patient states wound are getting worse. Duration: Patient has had the wound for <4 weeks prior to presenting for treatment Timing: Pain in wound is Intermittent (comes and goes Context: The wound occurred when the patient had a cast placed over his right lower extremity and with the cast was removed he had a swelling of the knee with problems with his skin Modifying Factors: Other treatment(s) tried include:orthopedics Associated Signs and Symptoms: swelling of both lower extremities with large amount of swelling of  the knee HPI Description: 71 year old gentleman who has been living in an independent living facility for long-term care has had recurrent falls and recently had a large hematoma on the right knee. Known to be on aspirin and Plavix for history of CABG and was seen in the ER at the end of February after a fall. past medical history of arthritis, congestive heart failure, diabetes mellitus, hypertension, status post CABG o2, status post cholecystectomy, hernia repair and kidney surgery. recent workup for the fall showed negative clinical findings in his extremities and it was thought that he was falling due to deconditioning. a x-ray showed a right fibular neck fracture and was placed in a splint. he was asked to be seen by orthopedic surgeon. the patient cannot tell me which orthopedic doctor had seen him and I'm not able to get any notes from [place], regarding his orthopedic care. The patient says twice during this last week he has had bloody fluid shooting out of his knee and has drained all over his boot and the flow. 03/30/[name] -- he was seen by the nurse practitioner at the facility and and distend she aspirated about 60 mL of hemorrhagic fluid from his right knee. 04/09/[name] -- since I saw the patient last he was admitted to the hospital at [facility] between 04/01/[name] 2 04/05/[name] and was treated with a pulmonary embolism. he had EKOS complete, was started on Xarelto, and also treated for chronic diastolic congestive heart failure. he was asked to follow-up with the wound center and continue management locally. 04/16/[name] -- he has an appointment to see his orthopedic surgeon later this week. 04/23/[name] -- he did see the orthopedic service and they have asked him to continue with physical therapy. They discontinued the boot and the x-ray done showed a healed fracture. They said it was okay to use a wound VAC if needed. Addendum -- we got the note from the Orthopedic office where he was  seen by [facility] orthopedics on [facility] in [place] and was seen by the PA Carlynn Spry. -- The x-ray which was done was reviewed and there is good healing of the fracture and from the orthopedic point of view he has healed well and they have asked him to continue with good control of his diabetes and management by the wound center including using a wound VAC if required. [name], [name] ([id number]) 04/30/[name] -- the patient's wound VAC had been applied 2 days ago, but the black sponge had not been placed laterally under the skin flap and into the area of the right of the knee, where there was a large cavity. On examination, after removal of his wound VAC, there is a large collection of seropurulent fluid and hematoma in the area starting at the lateral knee and going down to his right lateral compartment of the leg. Clinically, this is a huge infected abscess cavity 05/10/[name] -- patient was admitted between 04/30/[name] and 05/02/[name] with cellulitis of the right lower extremity with a large infected hematoma which was drained at the bedside by surgical services. he was treated with Levaquin and continues to be on Xarelto. the patient is back on a wound VAC which has not been functioning due to the large depth of the hematoma and organized clots. He is still on Keflex orally. Addendum : I spoke to his surgeon Dr. Tama High and we discussed options for further draining this large hematoma which I suspect will get infected sooner than later. I expressed the fact that the wound VAC does not seem to be functioning well and competent enough to drain this large cavity. He will call for him in the office. [name] -- the patient has hypotension in the sitting position and is a bit lethargic as compared to his normal self. I spoke personally to his nurse at the nursing homes, Ms Judson Roch and discussed his management including the fact that he had to have a surgical consultation for the  right lower leg, with Dr. Tama High. Prior to the patient's discharge from the wound center he continues to have hypotension and hence I'm going to talk to the ER physician and have him evaluated in the ER. 05/24/[name] -- the patient was admitted to the hospital between 05/17/[name] and 05/21/[name]. He was treated for hypokalemia, hyponatremia, renal insufficiency, sepsis and possible abscess of the right lower leg. he was seen by surgical services and the fluid collection was drained with a Yankauer suction from the knee wound and the opinion of the surgeons was that he would not need any surgical intervention and continue with local wound care. I had personally communicated with Dr. Tama High regarding this. the patient is still on Xarelto. During the admission he was also seen by Dr. Ola Spurr for infectious disease consult and he recommended continue vancomycin for MRSA and change meropenem to ceftriaxone for this Proteus. He recommended a four-week course of antibiotic therapy and follow-up back with Dr. Ola Spurr in 4 weeks. before his discharge since there was clinical improvement Dr. Ola Spurr converted the treatment to oral clindamycin and oral Keflex for a four-week course of antibiotic therapy. He was to be monitored for C. difficile. 06/07/[name] -- he missed his appointment with Dr. Rosana Hoes at the surgical office. He continues to be on oral antibiotics as per Dr. Ola Spurr. 11/16/16 patient's wound on the right knee continues to trouble him. He tells me that the nursing facility has not changed this dressing in almost a week  and his wound is doing somewhat worse today in regard to undermining compared to last week's evaluation. He continues to be frustrated with this. Unfortunately he has tried to switch facilities but was not able to get into any other so far. Electronic Signature(s) Signed: 7/2/[name] 9:53:11 AM By: Christin Fudge MD, FACS Entered By: Christin Fudge on 07/02/[name]  09:53:11 [name], [name] ([id number]) -------------------------------------------------------------------------------- Physical Exam Details Patient Name: [name] Gathers T. Date of Service: 7/2/[name] 8:45 AM Medical Record Number: [id number] Patient Account Number: [id number] Date of Birth/Sex: 08/29/1945 (71 y.o. Male) Treating RN: Ahmed Prima Primary Care Provider: [name] Dupre Other Clinician: Referring Provider: [name] Dupre Treating Provider/Extender: Frann Rider in Treatment: 14 Constitutional . Pulse regular. Respirations normal and unlabored. Afebrile. . Eyes Nonicteric. Reactive to light. Ears, Nose, Mouth, and Throat Lips, teeth, and gums WNL.[name] Moist mucosa without lesions. Neck supple and nontender. No palpable supraclavicular or cervical adenopathy. Normal sized without goiter. Respiratory WNL. No retractions.. Breath sounds WNL, No rubs, rales, rhonchi, or wheeze.. Cardiovascular Heart rhythm and rate regular, no murmur or gallop.. Pedal Pulses WNL. No clubbing, cyanosis or edema. Lymphatic No adneopathy. No adenopathy. No adenopathy. Musculoskeletal Adexa without tenderness or enlargement.. Digits and nails w/o clubbing, cyanosis, infection, petechiae, ischemia, or inflammatory conditions.. Integumentary (Hair, Skin) No suspicious lesions. No crepitus or fluctuance. No peri-wound warmth or erythema. No masses.[name] Psychiatric Judgement and insight Intact.. No evidence of depression, anxiety, or agitation.. Notes the medial part of the wound has healed but there is a fairly deep pocket on the right lateral knee which goes down toward 5 cm and this is not being packed appropriately by the nursing home. No sharp debridement was required today. Electronic Signature(s) Signed: 7/2/[name] 9:53:45 AM By: Christin Fudge MD, FACS Entered By: Christin Fudge on 07/02/[name] 09:53:45 [name], [name]  ([id number]) -------------------------------------------------------------------------------- Physician Orders Details Patient Name: [name] Gathers T. Date of Service: 7/2/[name] 8:45 AM Medical Record Number: [id number] Patient Account Number: [id number] Date of Birth/Sex: [birth year] (71 y.o. Male) Treating RN: Ahmed Prima Primary Care Provider: [name] Dupre Other Clinician: Referring Provider: [name] Dupre Treating Provider/Extender: Frann Rider in Treatment: 110 Verbal / Phone Orders: Yes Clinician: Carolyne Fiscal, Debi Read Back and Verified: Yes Diagnosis Coding Wound Cleansing Wound #1 Right,Anterior Knee o Clean wound with Normal Saline. o Cleanse wound with mild soap and water Anesthetic Wound #1 Right,Anterior Knee o Topical Lidocaine 4% cream applied to wound bed prior to debridement - Only in the Wound Clinic Primary Wound Dressing Wound #1 Right,Anterior Knee o Iodoform packing Gauze - pack lightly in wound, pack to the depth of the wound Secondary Dressing Wound #1 Right,Anterior Knee o ABD pad o Dry Gauze o Conform/Kerlix o Other - tape Dressing Change Frequency Wound #1 Right,Anterior Knee o Change dressing every day. o Other: - as needed Follow-up Appointments Wound #1 Right,Anterior Knee o Return Appointment in 1 week. Edema Control Wound #1 Right,Anterior Knee o Elevate legs to the level of the heart and pump ankles as often as possible Additional Orders / Instructions [name], [name] ([id number]) Wound #1 Right,Anterior Knee o Increase protein intake. o Activity as tolerated Medications-please add to medication list. Wound #1 Right,Anterior Knee o Other: - Vitamin C, Zinc, MVI Electronic Signature(s) Signed: 7/2/[name] 11:32:28 AM By: Christin Fudge MD, FACS Signed: 7/2/[name] 4:51:42 PM By: Alric Quan Entered By: Alric Quan on 07/02/[name] 09:28:06 [name], [name]  ([id number]) -------------------------------------------------------------------------------- Problem List Details Patient Name: Askari, Wayland T. Date of Service: 7/2/[name] 8:45 AM Medical Record Number:  [id number] Patient Account Number: [id number] Date of Birth/Sex: [birth year] (71 y.o. 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, right knee 3/23/[name] Yes L97.812 Non-pressure chronic ulcer of other part of right lower leg 3/23/[name] Yes with fat layer exposed L89.892 Pressure ulcer of other site, stage 2 3/23/[name] Yes M70.861 Other soft tissue disorders related to use, overuse and 3/23/[name] Yes pressure, right lower leg M71.061 Abscess of bursa, right knee 4/30/[name] Yes Inactive Problems Resolved Problems Electronic Signature(s) Signed: 7/2/[name] 9:52:42 AM By: Christin Fudge MD, FACS Entered By: Christin Fudge on 07/02/[name] 09:52:41 [name], [name] ([id number]) -------------------------------------------------------------------------------- Progress Note Details Patient Name: [name] Gathers T. Date of Service: 7/2/[name] 8:45 AM Medical Record Number: [id number] Patient Account Number: [id number] Date of Birth/Sex: 1945-11-22 (71 y.o. Male) Treating RN: Ahmed Prima Primary Care Provider: [name] Dupre Other Clinician: Referring Provider: [name] Dupre Treating Provider/Extender: Frann Rider in Treatment: 14 Subjective Chief Complaint Information obtained from Patient Patient is at the clinic for treatment of an open pressure ulcer to the area of the right knee History of Present Illness (HPI) The following HPI elements were documented for  the patient's wound: Location: right knee swelling with skin ulceration Quality: Patient reports experiencing a dull pain to affected area(s). Severity: Patient states wound are getting worse. Duration: Patient has had the wound for Timing: Pain in wound is Intermittent (comes and goes Context: The wound occurred when the patient had a cast placed over his right lower extremity and with the cast was removed he had a swelling of the knee with problems with his skin Modifying Factors: Other treatment(s) tried include:orthopedics Associated Signs and Symptoms: swelling of both lower extremities with large amount of swelling of the knee 71 year old gentleman who has been living in an independent living facility for long-term care has had recurrent falls and recently had a large hematoma on the right knee. Known to be on aspirin and Plavix for history of CABG and was seen in the ER at the end of February after a fall. past medical history of arthritis, congestive heart failure, diabetes mellitus, hypertension, status post CABG o2, status post cholecystectomy, hernia repair and kidney surgery. recent workup for the fall showed negative clinical findings in his extremities and it was thought that he was falling due to deconditioning. a x-ray showed a right fibular neck fracture and was placed in a splint. he was asked to be seen by orthopedic surgeon. the patient cannot tell me which orthopedic doctor had seen him and I'm not able to get any notes from [place], regarding his orthopedic care. The patient says twice during this last week he has had bloody fluid shooting out of his knee and has drained all over his boot and the flow. 03/30/[name] -- he was seen by the nurse practitioner at the facility and and distend she aspirated about 60 mL of hemorrhagic fluid from his right knee. 04/09/[name] -- since I saw the patient last he was admitted to the hospital at [facility] between 04/01/[name] 2 04/05/[name]  and was treated with a pulmonary embolism. he had EKOS complete, was started on Xarelto, and also treated for chronic diastolic congestive heart failure. he was asked to follow-up with the wound center and continue management locally. 04/16/[name] -- he has an appointment to see  his orthopedic surgeon later this week. 04/23/[name] -- he did see the orthopedic service and they have asked him to continue with physical therapy. They discontinued the boot and the x-ray done showed a healed fracture. They said it was okay to use a Tatum, Daman T. ([id number]) wound VAC if needed. Addendum -- we got the note from the Orthopedic office where he was seen by [facility] orthopedics on [facility] in [place] and was seen by the PA Carlynn Spry. -- The x-ray which was done was reviewed and there is good healing of the fracture and from the orthopedic point of view he has healed well and they have asked him to continue with good control of his diabetes and management by the wound center including using a wound VAC if required. 04/30/[name] -- the patient's wound VAC had been applied 2 days ago, but the black sponge had not been placed laterally under the skin flap and into the area of the right of the knee, where there was a large cavity. On examination, after removal of his wound VAC, there is a large collection of seropurulent fluid and hematoma in the area starting at the lateral knee and going down to his right lateral compartment of the leg. Clinically, this is a huge infected abscess cavity 05/10/[name] -- patient was admitted between 04/30/[name] and 05/02/[name] with cellulitis of the right lower extremity with a large infected hematoma which was drained at the bedside by surgical services. he was treated with Levaquin and continues to be on Xarelto. the patient is back on a wound VAC which has not been functioning due to the large depth of the hematoma and organized clots. He is still on Keflex  orally. Addendum : I spoke to his surgeon Dr. Tama High and we discussed options for further draining this large hematoma which I suspect will get infected sooner than later. I expressed the fact that the wound VAC does not seem to be functioning well and competent enough to drain this large cavity. He will call for him in the office. QHU76 [name] -- the patient has hypotension in the sitting position and is a bit lethargic as compared to his normal self. I spoke personally to his nurse at the nursing homes, Ms Judson Roch and discussed his management including the fact that he had to have a surgical consultation for the right lower leg, with Dr. Tama High. Prior to the patient's discharge from the wound center he continues to have hypotension and hence I'm going to talk to the ER physician and have him evaluated in the ER. 05/24/[name] -- the patient was admitted to the hospital between 05/17/[name] and 05/21/[name]. He was treated for hypokalemia, hyponatremia, renal insufficiency, sepsis and possible abscess of the right lower leg. he was seen by surgical services and the fluid collection was drained with a Yankauer suction from the knee wound and the opinion of the surgeons was that he would not need any surgical intervention and continue with local wound care. I had personally communicated with Dr. Tama High regarding this. the patient is still on Xarelto. During the admission he was also seen by Dr. Ola Spurr for infectious disease consult and he recommended continue vancomycin for MRSA and change meropenem to ceftriaxone for this Proteus. He recommended a four-week course of antibiotic therapy and follow-up back with Dr. Ola Spurr in 4 weeks. before his discharge since there was clinical improvement Dr. Ola Spurr converted the treatment to oral clindamycin and oral Keflex for a four-week course of  antibiotic therapy. He was to be monitored for C. difficile. 06/07/[name] -- he missed his  appointment with Dr. Rosana Hoes at the surgical office. He continues to be on oral antibiotics as per Dr. Ola Spurr. 11/16/16 patient's wound on the right knee continues to trouble him. He tells me that the nursing facility has not changed this dressing in almost a week and his wound is doing somewhat worse today in regard to undermining compared to last week's evaluation. He continues to be frustrated with this. Unfortunately he has tried to switch facilities but was not able to get into any other so far. [name], [name] ([id number]) Objective Constitutional Pulse regular. Respirations normal and unlabored. Afebrile. Vitals Time Taken: 9:11 AM, Height: 67 in, Weight: 357 lbs, BMI: 55.9, Temperature: 97.7 F, Pulse: 78 bpm, Respiratory Rate: 16 breaths/min, Blood Pressure: 121/53 mmHg. Eyes Nonicteric. Reactive to light. Ears, Nose, Mouth, and Throat Lips, teeth, and gums WNL.[name] Moist mucosa without lesions. Neck supple and nontender. No palpable supraclavicular or cervical adenopathy. Normal sized without goiter. Respiratory WNL. No retractions.. Breath sounds WNL, No rubs, rales, rhonchi, or wheeze.. Cardiovascular Heart rhythm and rate regular, no murmur or gallop.. Pedal Pulses WNL. No clubbing, cyanosis or edema. Lymphatic No adneopathy. No adenopathy. No adenopathy. Musculoskeletal Adexa without tenderness or enlargement.. Digits and nails w/o clubbing, cyanosis, infection, petechiae, ischemia, or inflammatory conditions.[name] Psychiatric Judgement and insight Intact.. No evidence of depression, anxiety, or agitation.. General Notes: the medial part of the wound has healed but there is a fairly deep pocket on the right lateral knee which goes down toward 5 cm and this is not being packed appropriately by the nursing home. No sharp debridement was required today. Integumentary (Hair, Skin) No suspicious lesions. No crepitus or fluctuance. No peri-wound warmth or erythema. No  masses.. Wound #1 status is Open. Original cause of wound was Pressure Injury. The wound is located on the [name], [name] ([id number]) Right,Anterior Knee. The wound measures 0.3cm length x 1cm width x 0.3cm depth; 0.236cm^2 area and 0.071cm^3 volume. There is Fat Layer (Subcutaneous Tissue) Exposed exposed. There is undermining starting at 6:00 and ending at :00 with a maximum distance of 5cm. There is a large amount of serosanguineous drainage noted. The wound margin is flat and intact. There is large (67-100%) red granulation within the wound bed. There is no necrotic tissue within the wound bed. The periwound skin appearance exhibited: Erythema. The periwound skin appearance did not exhibit: Callus, Crepitus, Excoriation, Induration, Rash, Scarring, Dry/Scaly, Maceration, Atrophie Blanche, Cyanosis, Ecchymosis, Hemosiderin Staining, Mottled, Pallor, Rubor. The surrounding wound skin color is noted with erythema which is circumferential. Periwound temperature was noted as No Abnormality. The periwound has tenderness on palpation. 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 Cleanse wound with mild soap and water Anesthetic: Wound #1 Right,Anterior Knee: Topical Lidocaine 4% cream applied to wound bed prior to debridement - Only in the Wound Clinic Primary Wound Dressing: Wound #1 Right,Anterior Knee: Iodoform packing Gauze - pack lightly in wound, pack to the depth of the wound Secondary Dressing: Wound #1 Right,Anterior Knee: ABD pad  Dry  Gauze Conform/Kerlix Other - tape Dressing Change Frequency: [name], Anthony T. ([id number]) Wound #1 Right,Anterior Knee: Change dressing every day. Other: - as needed Follow-up Appointments: Wound #1 Right,Anterior Knee: Return Appointment in 1 week. Edema Control: Wound #1 Right,Anterior Knee: Elevate legs to the level of the heart and pump ankles as often as possible Additional Orders / Instructions: Wound #1 Right,Anterior Knee: Increase protein intake. Activity as tolerated Medications-please add to medication list.: Wound #1 Right,Anterior Knee: Other: - Vitamin C, Zinc, MVI The patient has improved over the last 2 weeks and after careful examination of the patient's right lower extremity, I have recommended: 1. Packing the wound at the knee with 1/4 inch iodoform gauze to be changed daily and when necessary. 2. Continue with oral antibiotics as per Dr. Ola Spurr. Electronic Signature(s) Signed: 7/2/[name] 9:54:44 AM By: Christin Fudge MD, FACS Entered By: Christin Fudge on 07/02/[name] 09:54:44 Skellenger, [name] ([id number]) -------------------------------------------------------------------------------- SuperBill Details Patient Name: [name] Gathers T. Date of Service: 7/2/[name] Medical Record Number: [id number] Patient Account Number: [id number] Date of Birth/Sex: Oct 20, 1945 (71 y.o. 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 L89.892 Pressure ulcer of other site, stage 2 M70.861 Other soft tissue disorders related to use, overuse and pressure, right lower leg M71.061 Abscess of bursa,  right knee Facility Procedures CPT4 Code: 54650354 Description: 99213 - WOUND CARE VISIT-LEV 3 EST PT Modifier: Quantity: 1 Physician Procedures CPT4: Description Modifier Quantity Code 6568127 51700 - WC PHYS LEVEL 3 - EST PT 1 ICD-10 Description Diagnosis E11.622 Type 2 diabetes mellitus with other skin ulcer L97.812 Non-pressure chronic ulcer of other part of right lower leg with fat layer  exposed M70.861 Other soft tissue disorders related to use, overuse and pressure, right lower leg Electronic Signature(s) Signed: 7/2/[name] 4:25:24 PM By: Christin Fudge MD, FACS Signed: 7/2/[name] 4:51:42 PM By: Alric Quan Previous Signature: 7/2/[name] 9:54:58 AM Version By: Christin Fudge MD, FACS Entered By: Alric Quan on 07/02/[name] 11:35:14

## 2016-11-30 ENCOUNTER — Encounter: Payer: Medicare Other | Admitting: Physician Assistant

## 2016-11-30 DIAGNOSIS — E11622 Type 2 diabetes mellitus with other skin ulcer: Secondary | ICD-10-CM | POA: Diagnosis not present

## 2016-12-01 NOTE — Progress Notes (Signed)
WELDEN, HAUSMANN ([id number]) Visit Report for 11/30/2016 Arrival Information Details Patient Name: [name], [name]. Date of Service: 11/30/2016 8:45 AM Medical Record Number: [id number] Patient Account Number: [id number] Date of Birth/Sex: Mar 03, 1946 (71 y.o. Male) Treating RN: Ahmed Prima Primary Care Malikah Lakey: [name] Dupre Other Clinician: Referring Elizardo Chilson: [name] Dupre Treating Leshia Kope/Extender: Melburn Hake, HOYT Weeks in Treatment: 15 Visit Information History Since Last Visit All ordered tests and consults were completed: No Patient Arrived: Wheel Chair Added or deleted any medications: No Arrival Time: 08:38 Any new allergies or adverse reactions: No Accompanied By: self Had a fall or experienced change in No Transfer Assistance: EasyPivot activities of daily living that may affect Patient Lift risk of falls: Patient Identification Verified: Yes Signs or symptoms of abuse/neglect since last No Secondary Verification Process Yes visito Completed: Hospitalized since last visit: No Patient Requires Transmission- No Has Dressing in Place as Prescribed: Yes Based Precautions: Pain Present Now: Yes Patient Has Alerts: No Electronic Signature(s) Signed: 11/30/2016 5:10:25 PM By: Alric Quan Entered By: Alric Quan on 11/30/2016 08:42:00 Kise, [name] ([id number]) -------------------------------------------------------------------------------- Clinic Level of Care Assessment Details Patient Name: [name] Mule. Date of Service: 11/30/2016 8:45 AM Medical Record Number: [id number] Patient Account Number: [id number] Date of Birth/Sex: [birth year] (71 y.o. Male) Treating RN: Ahmed Prima Primary Care Makinzie Considine: [name] Dupre Other Clinician: Referring Korde Jeppsen: [name] Dupre Treating Emarie Paul/Extender: Melburn Hake, HOYT Weeks in Treatment: 15 Clinic Level of Care Assessment Items TOOL 4 Quantity Score X - Use when only an EandM is performed  on FOLLOW-UP visit 1 0 ASSESSMENTS - Nursing Assessment / Reassessment X - Reassessment of Co-morbidities (includes updates in patient status) 1 10 X - Reassessment of Adherence to Treatment Plan 1 5 ASSESSMENTS - Wound and Skin Assessment / Reassessment X - Simple Wound Assessment / Reassessment - one wound 1 5 []  - Complex Wound Assessment / Reassessment - multiple wounds 0 []  - Dermatologic / Skin Assessment (not related to wound area) 0 ASSESSMENTS - Focused Assessment []  - Circumferential Edema Measurements - multi extremities 0 []  - Nutritional Assessment / Counseling / Intervention 0 []  - Lower Extremity Assessment (monofilament, tuning fork, pulses) 0 []  - Peripheral Arterial Disease Assessment (using hand held doppler) 0 ASSESSMENTS - Ostomy and/or Continence Assessment and Care []  - Incontinence Assessment and Management 0 []  - Ostomy Care Assessment and Management (repouching, etc.) 0 PROCESS - Coordination of Care []  - Simple Patient / Family Education for ongoing care 0 X - Complex (extensive) Patient / Family Education for ongoing care 1 20 X - Staff obtains Programmer, systems, Records, Test Results / Process Orders 1 10 X - Staff telephones HHA, Nursing Homes / Clarify orders / etc 1 10 []  - Routine Transfer to another Facility (non-emergent condition) 0 [name], [name] ([id number]) []  - Routine Hospital Admission (non-emergent condition) 0 []  - New Admissions / Biomedical engineer / Ordering NPWT, Apligraf, etc. 0 []  - Emergency Hospital Admission (emergent condition) 0 X - Simple Discharge Coordination 1 10 []  - Complex (extensive) Discharge Coordination 0 PROCESS - Special Needs []  - Pediatric / Minor Patient Management 0 []  - Isolation Patient Management 0 []  - Hearing / Language / Visual special needs 0 []  - Assessment of Community assistance (transportation, D/C planning, etc.) 0 []  - Additional assistance / Altered mentation 0 []  - Support Surface(s) Assessment  (bed, cushion, seat, etc.) 0 INTERVENTIONS - Wound Cleansing / Measurement X - Simple Wound Cleansing - one wound 1 5 []  - Complex Wound Cleansing - multiple  wounds 0 X - Wound Imaging (photographs - any number of wounds) 1 5 []  - Wound Tracing (instead of photographs) 0 X - Simple Wound Measurement - one wound 1 5 []  - Complex Wound Measurement - multiple wounds 0 INTERVENTIONS - Wound Dressings X - Small Wound Dressing one or multiple wounds 1 10 []  - Medium Wound Dressing one or multiple wounds 0 []  - Large Wound Dressing one or multiple wounds 0 X - Application of Medications - topical 1 5 []  - Application of Medications - injection 0 INTERVENTIONS - Miscellaneous []  - External ear exam 0 [name], [name] ([id number]) []  - Specimen Collection (cultures, biopsies, blood, body fluids, etc.) 0 []  - Specimen(s) / Culture(s) sent or taken to Lab for analysis 0 []  - Patient Transfer (multiple staff / Harrel Lemon Lift / Similar devices) 0 []  - Simple Staple / Suture removal (25 or less) 0 []  - Complex Staple / Suture removal (26 or more) 0 []  - Hypo / Hyperglycemic Management (close monitor of Blood Glucose) 0 []  - Ankle / Brachial Index (ABI) - do not check if billed separately 0 X - Vital Signs 1 5 Has the patient been seen at the hospital within the last three years: Yes Total Score: 105 Level Of Care: New/Established - Level 3 Electronic Signature(s) Signed: 11/30/2016 5:10:25 PM By: Alric Quan Entered By: Alric Quan on 11/30/2016 10:34:45 Cantrall, [name] ([id number]) -------------------------------------------------------------------------------- Encounter Discharge Information Details Patient Name: [name] Gathers T. Date of Service: 11/30/2016 8:45 AM Medical Record Number: [id number] Patient Account Number: [id number] Date of Birth/Sex: [birth year] (71 y.o. Male) Treating RN: Ahmed Prima Primary Care Kyri Shader: [name] Dupre Other Clinician: Referring Correna Meacham:  [name] Dupre Treating Khye Hochstetler/Extender: Melburn Hake, HOYT Weeks in Treatment: 15 Encounter Discharge Information Items Discharge Pain Level: 4 Discharge Condition: Stable Ambulatory Status: Wheelchair Discharge Destination: Nursing Home Transportation: Other Accompanied By: self Schedule Follow-up Appointment: Yes Medication Reconciliation completed No and provided to Patient/Care Bellarae Lizer: Provided on Clinical Summary of Care: 11/30/2016 Form Type Recipient Paper Patient JE Electronic Signature(s) Signed: 11/30/2016 9:21:08 AM By: Ruthine Dose Entered By: Ruthine Dose on 11/30/2016 09:21:08 Rzasa, [name] ([id number]) -------------------------------------------------------------------------------- Lower Extremity Assessment Details Patient Name: [name] Gathers T. Date of Service: 11/30/2016 8:45 AM Medical Record Number: [id number] Patient Account Number: [id number] Date of Birth/Sex: May 02, 1946 (71 y.o. Male) Treating RN: Ahmed Prima Primary Care Shyan Scalisi: [name] Dupre Other Clinician: Referring Kirah Stice: [name] Dupre Treating Khamiya Varin/Extender: Melburn Hake, HOYT Weeks in Treatment: 15 Vascular Assessment Pulses: Dorsalis Pedis Palpable: [Right:Yes] Posterior Tibial Extremity colors, hair growth, and conditions: Extremity Color: [Right:Hyperpigmented] Temperature of Extremity: [Right:Warm] Capillary Refill: [Right:> 3 seconds] Electronic Signature(s) Signed: 11/30/2016 5:10:25 PM By: Alric Quan Entered By: Alric Quan on 11/30/2016 08:57:20 [name], [name] ([id number]) -------------------------------------------------------------------------------- Multi Wound Chart Details Patient Name: [name] Gathers T. Date of Service: 11/30/2016 8:45 AM Medical Record Number: [id number] Patient Account Number: [id number] Date of Birth/Sex: 1945/06/22 (71 y.o. Male) Treating RN: Ahmed Prima Primary Care Eyan Hagood: [name] Dupre Other  Clinician: Referring Lizzie An: [name] Dupre Treating Taos Tapp/Extender: Melburn Hake, HOYT Weeks in Treatment: 15 Vital Signs Height(in): 67 Pulse(bpm): 75 Weight(lbs): 357 Blood Pressure 106/49 (mmHg): Body Mass Index(BMI): 56 Temperature(F): 99.1 Respiratory Rate 16 (breaths/min): Photos: [1:No Photos] [N/A:N/A] Wound Location: [1:Right Knee - Anterior] [N/A:N/A] Wounding Event: [1:Pressure Injury] [N/A:N/A] Primary Etiology: [1:Diabetic Wound/Ulcer of the Lower Extremity] [N/A:N/A] Comorbid History: [1:Anemia, Asthma, Arrhythmia, Congestive Heart Failure, Coronary Artery Disease, Hypertension, Type II Diabetes, Osteoarthritis] [N/A:N/A] Date Acquired: [1:07/23/2016] [N/A:N/A] Weeks of Treatment: [1:15] [N/A:N/A] Wound Status: [  1:Open] [N/A:N/A] Measurements L x W x D 0.3x1x0.3 [N/A:N/A] (cm) Area (cm) : [1:0.236] [N/A:N/A] Volume (cm) : [1:0.071] [N/A:N/A] % Reduction in Area: [1:97.00%] [N/A:N/A] % Reduction in Volume: 91.00% [N/A:N/A] Starting Position 1 6 (o'clock): Ending Position 1 (o'clock): Maximum Distance 1 4.5 (cm): Undermining: [1:Yes] [N/A:N/A] Classification: [1:Grade 1] [N/A:N/A] Exudate Amount: [1:Large] [N/A:N/A] Exudate Type: [1:Serosanguineous] [N/A:N/A] Exudate Color: red, brown N/A N/A Wound Margin: Flat and Intact N/A N/A Granulation Amount: Large (67-100%) N/A N/A Granulation Quality: Red N/A N/A Necrotic Amount: None Present (0%) N/A N/A Exposed Structures: Fat Layer (Subcutaneous N/A N/A Tissue) Exposed: Yes Fascia: No Tendon: No Muscle: No Joint: No Bone: No Epithelialization: None N/A N/A Periwound Skin Texture: Excoriation: No N/A N/A Induration: No Callus: No Crepitus: No Rash: No Scarring: No Periwound Skin Maceration: No N/A N/A Moisture: Dry/Scaly: No Periwound Skin Color: Erythema: Yes N/A N/A Atrophie Blanche: No Cyanosis: No Ecchymosis: No Hemosiderin Staining: No Mottled: No Pallor: No Rubor: No Erythema  Location: Circumferential N/A N/A Temperature: No Abnormality N/A N/A Tenderness on Yes N/A N/A Palpation: Wound Preparation: Ulcer Cleansing: N/A N/A Rinsed/Irrigated with Saline Topical Anesthetic Applied: Other: Lidocaine 4% Treatment Notes Electronic Signature(s) Signed: 11/30/2016 5:10:25 PM By: Alric Quan Entered By: Alric Quan on 11/30/2016 08:57:31 Rahl, [name] ([id number]) -------------------------------------------------------------------------------- [place]urt Details Patient Name: [name] Gathers T. Date of Service: 11/30/2016 8:45 AM Medical Record Number: [id number] Patient Account Number: [id number] Date of Birth/Sex: 01-Mar-1946 (71 y.o. Male) Treating RN: Ahmed Prima Primary Care Toby Breithaupt: [name] Dupre Other Clinician: Referring Zandyr Barnhill: [name] Dupre Treating Orlanda Frankum/Extender: Melburn Hake, HOYT Weeks in Treatment: 15 Active Inactive ` Orientation to the Wound Care Program Nursing Diagnoses: Knowledge deficit related to the wound healing center program Goals: Patient/caregiver will verbalize understanding of the [place] Program Date Initiated: 08/14/2016 Target Resolution Date: 12/14/2016 Goal Status: Active Interventions: Provide education on orientation to the wound center Notes: ` Pressure Nursing Diagnoses: Knowledge deficit related to causes and risk factors for pressure ulcer development Knowledge deficit related to management of pressures ulcers Potential for impaired tissue integrity related to pressure, friction, moisture, and shear Goals: Patient will remain free from development of additional pressure ulcers Date Initiated: 08/14/2016 Target Resolution Date: 12/14/2016 Goal Status: Active Patient will remain free of pressure ulcers Date Initiated: 08/14/2016 Target Resolution Date: 12/14/2016 Goal Status: Active Patient/caregiver will verbalize risk factors for pressure ulcer  development Date Initiated: 08/14/2016 Target Resolution Date: 12/14/2016 Goal Status: Active Patient/caregiver will verbalize understanding of pressure ulcer management Date Initiated: 08/14/2016 Target Resolution Date: 12/14/2016 Goal Status: Active [name], [name] ([id number]) Interventions: Assess: immobility, friction, shearing, incontinence upon admission and as needed Assess offloading mechanisms upon admission and as needed Assess potential for pressure ulcer upon admission and as needed Provide education on pressure ulcers Treatment Activities: Patient referred for seating evaluation to ensure proper offloading : 08/14/2016 Pressure reduction/relief device ordered : 08/14/2016 Notes: ` Wound/Skin Impairment Nursing Diagnoses: Impaired tissue integrity Knowledge deficit related to ulceration/compromised skin integrity Goals: Patient/caregiver will verbalize understanding of skin care regimen Date Initiated: 08/14/2016 Target Resolution Date: 12/14/2016 Goal Status: Active Ulcer/skin breakdown will have a volume reduction of 30% by week 4 Date Initiated: 08/14/2016 Target Resolution Date: 12/14/2016 Goal Status: Active Ulcer/skin breakdown will have a volume reduction of 50% by week 8 Date Initiated: 08/14/2016 Target Resolution Date: 12/14/2016 Goal Status: Active Ulcer/skin breakdown will have a volume reduction of 80% by week 12 Date Initiated: 08/14/2016 Target Resolution Date: 12/14/2016 Goal Status: Active Ulcer/skin breakdown will heal  within 14 weeks Date Initiated: 08/14/2016 Target Resolution Date: 12/14/2016 Goal Status: Active Interventions: Assess patient/caregiver ability to obtain necessary supplies Assess patient/caregiver ability to perform ulcer/skin care regimen upon admission and as needed Assess ulceration(s) every visit Provide education on ulcer and skin care Treatment Activities: Referred to DME Christionna Poland for dressing supplies : 08/14/2016 Skin care  regimen initiated : 08/14/2016 [name], [name] ([id number]) Topical wound management initiated : 08/14/2016 Notes: Electronic Signature(s) Signed: 11/30/2016 5:10:25 PM By: Alric Quan Entered By: Alric Quan on 11/30/2016 08:57:24 [name], [name] ([id number]) -------------------------------------------------------------------------------- Pain Assessment Details Patient Name: [name] Gathers T. Date of Service: 11/30/2016 8:45 AM Medical Record Number: [id number] Patient Account Number: [id number] Date of Birth/Sex: Mar 31, 1946 (71 y.o. Male) Treating RN: Ahmed Prima Primary Care Jermani Eberlein: [name] Dupre Other Clinician: Referring Ikaika Showers: [name] Dupre Treating Merci Walthers/Extender: Melburn Hake, HOYT Weeks in Treatment: 15 Active Problems Location of Pain Severity and Description of Pain Patient Has Paino Yes Site Locations Pain Location: Generalized Pain Rate the pain. Current Pain Level: 5 Character of Pain Describe the Pain: Aching Pain Management and Medication Current Pain Management: Electronic Signature(s) Signed: 11/30/2016 5:10:25 PM By: Alric Quan Entered By: Alric Quan on 11/30/2016 08:42:15 [name], [name] ([id number]) -------------------------------------------------------------------------------- Patient/Caregiver Education Details Patient Name: [name] Gathers T. Date of Service: 11/30/2016 8:45 AM Medical Record Number: [id number] Patient Account Number: [id number] Date of Birth/Gender: Aug 28, 1945 (71 y.o. Male) Treating RN: Ahmed Prima Primary Care Physician: [name] Dupre Other Clinician: Referring Physician: [name] Dupre Treating Physician/Extender: Sharalyn Ink in Treatment: 15 Education Assessment Education Provided To: Patient Education Topics Provided Wound/Skin Impairment: Handouts: Other: change dressing as ordered Methods: Demonstration, Explain/Verbal Responses: State content  correctly Electronic Signature(s) Signed: 11/30/2016 5:10:25 PM By: Alric Quan Entered By: Alric Quan on 11/30/2016 08:54:07 [name], [name] ([id number]) -------------------------------------------------------------------------------- Wound Assessment Details Patient Name: [name] Gathers T. Date of Service: 11/30/2016 8:45 AM Medical Record Number: [id number] Patient Account Number: [id number] Date of Birth/Sex: 05/21/1946 (71 y.o. Male) Treating RN: Ahmed Prima Primary Care Brett Soza: [name] Dupre Other Clinician: Referring Laniece Hornbaker: [name] Dupre Treating Curby Carswell/Extender: Melburn Hake, HOYT Weeks in Treatment: 15 Wound Status Wound Number: 1 Primary Diabetic Wound/Ulcer of the Lower Etiology: Extremity Wound Location: Right Knee - Anterior Wound Open Wounding Event: Pressure Injury Status: Date Acquired: 07/23/2016 Comorbid Anemia, Asthma, Arrhythmia, Weeks Of Treatment: 15 History: Congestive Heart Failure, Coronary Clustered Wound: No Artery Disease, Hypertension, Type II Diabetes, Osteoarthritis Photos Photo Uploaded By: Alric Quan on 11/30/2016 13:11:28 Wound Measurements Length: (cm) 0.3 % Reduction in Width: (cm) 1 % Reduction in Depth: (cm) 0.3 Epithelializati Area: (cm) 0.236 Tunneling: Volume: (cm) 0.071 Undermining: Starting Pos Maximum Dist Area: 97% Volume: 91% on: None No Yes ition (o'clock): 6 ance: (cm) 4.5 Wound Description Classification: Grade 1 Foul Odor Afte Wound Margin: Flat and Intact Slough/Fibrino Exudate Amount: Large Exudate Type: Serosanguineous Exudate Color: red, brown r Cleansing: No No Wound Bed Gheen, Tushar T. ([id number]) Granulation Amount: Large (67-100%) Exposed Structure Granulation Quality: Red Fascia Exposed: No Necrotic Amount: None Present (0%) Fat Layer (Subcutaneous Tissue) Exposed: Yes Tendon Exposed: No Muscle Exposed: No Joint Exposed: No Bone Exposed: No Periwound Skin  Texture Texture Color No Abnormalities Noted: No No Abnormalities Noted: No Callus: No Atrophie Blanche: No Crepitus: No Cyanosis: No Excoriation: No Ecchymosis: No Induration: No Erythema: Yes Rash: No Erythema Location: Circumferential Scarring: No Hemosiderin Staining: No Mottled: No Moisture Pallor: No No Abnormalities Noted: No Rubor: No Dry / Scaly: No Maceration: No Temperature / Pain Temperature: No Abnormality  Tenderness on Palpation: Yes Wound Preparation Ulcer Cleansing: Rinsed/Irrigated with Saline Topical Anesthetic Applied: Other: Lidocaine 4%, Treatment Notes Wound #1 (Right, Anterior Knee) 1. Cleansed with: Clean wound with Normal Saline 2. Anesthetic Topical Lidocaine 4% cream to wound bed prior to debridement 4. Dressing Applied: Iodoform packing Gauze 5. Secondary Dressing Applied ABD Pad Dry Gauze Kerlix/Conform 7. Secured with Recruitment consultant) Signed: 11/30/2016 5:10:25 PM By: Alric Quan Entered By: Alric Quan on 11/30/2016 08:51:16 [name], [name] ([id number]) [name], [name] ([id number]) -------------------------------------------------------------------------------- Vitals Details Patient Name: [name] Gathers T. Date of Service: 11/30/2016 8:45 AM Medical Record Number: [id number] Patient Account Number: [id number] Date of Birth/Sex: [birth year] (71 y.o. Male) Treating RN: Ahmed Prima Primary Care Meaghann Choo: [name] Dupre Other Clinician: Referring Lalitha Ilyas: [name] Dupre Treating Lonnette Shrode/Extender: Melburn Hake, HOYT Weeks in Treatment: 15 Vital Signs Time Taken: 08:42 Temperature (F): 99.1 Height (in): 67 Pulse (bpm): 75 Weight (lbs): 357 Respiratory Rate (breaths/min): 16 Body Mass Index (BMI): 55.9 Blood Pressure (mmHg): 106/49 Reference Range: 80 - 120 mg / dl Electronic Signature(s) Signed: 11/30/2016 5:10:25 PM By: Alric Quan Entered By: Alric Quan on 11/30/2016 08:44:08

## 2016-12-01 NOTE — Progress Notes (Signed)
CHEZ, BULNES ([id number]) Visit Report for 11/30/2016 Chief Complaint Document Details Patient Name: [name], [name]. Date of Service: 11/30/2016 8:45 AM Medical Record Number: [id number] Patient Account Number: [id number] Date of Birth/Sex: [birth year] (71 y.o. Male) Treating RN: Ahmed Prima Primary Care Provider: [name] Dupre Other Clinician: Referring Provider: [name] Dupre Treating Provider/Extender: Melburn Hake, Devereaux Grayson Weeks in Treatment: 15 Information Obtained from: Patient Chief Complaint Patient is at the clinic for treatment of an open pressure ulcer to the area of the right knee Electronic Signature(s) Signed: 11/30/2016 4:54:08 PM By: Worthy Keeler PA-C Entered By: Worthy Keeler on 11/30/2016 09:05:51 [name] ([id number]) -------------------------------------------------------------------------------- HPI Details Patient Name: [name]. Date of Service: 11/30/2016 8:45 AM Medical Record Number: [id number] Patient Account Number: [id number] Date of Birth/Sex: 01-05-1946 (71 y.o. Male) Treating RN: Ahmed Prima Primary Care Provider: [name] Dupre Other Clinician: Referring Provider: [name] Dupre Treating Provider/Extender: Melburn Hake, Tyshae Stair Weeks in Treatment: 15 History of Present Illness Location: right knee swelling with skin ulceration Quality: Patient reports experiencing a dull pain to affected area(s). Severity: Patient states wound are getting worse. Duration: Patient has had the wound for <4 weeks prior to presenting for treatment Timing: Pain in wound is Intermittent (comes and goes Context: The wound occurred when the patient had a cast placed over his right lower extremity and with the cast was removed he had a swelling of the knee with problems with his skin Modifying Factors: Other treatment(s) tried include:orthopedics Associated Signs and Symptoms: swelling of both lower extremities with large amount of swelling of  the knee HPI Description: 71 year old gentleman who has been living in an independent living facility for long-term care has had recurrent falls and recently had a large hematoma on the right knee. Known to be on aspirin and Plavix for history of CABG and was seen in the ER at the end of February after a fall. past medical history of arthritis, congestive heart failure, diabetes mellitus, hypertension, status post CABG o2, status post cholecystectomy, hernia repair and kidney surgery. recent workup for the fall showed negative clinical findings in his extremities and it was thought that he was falling due to deconditioning. a x-ray showed a right fibular neck fracture and was placed in a splint. he was asked to be seen by orthopedic surgeon. the patient cannot tell me which orthopedic doctor had seen him and I'm not able to get any notes from [place], regarding his orthopedic care. The patient says twice during this last week he has had bloody fluid shooting out of his knee and has drained all over his boot and the flow. 08/21/2016 -- he was seen by the nurse practitioner at the facility and and distend she aspirated about 60 mL of hemorrhagic fluid from his right knee. 08/31/2016 -- since I saw the patient last he was admitted to the hospital at [facility] between 08/23/2016 2 08/27/2016 and was treated with a pulmonary embolism. he had EKOS complete, was started on Xarelto, and also treated for chronic diastolic congestive heart failure. he was asked to follow-up with the wound center and continue management locally. 09/07/2016 -- he has an appointment to see his orthopedic surgeon later this week. 09/14/2016 -- he did see the orthopedic service and they have asked him to continue with physical therapy. They discontinued the boot and the x-ray done showed a healed fracture. They said it was okay to use a wound VAC if needed. Addendum -- we got the note from the Orthopedic office  where he was  seen by [facility] orthopedics on [facility] in [place] and was seen by the PA Carlynn Spry. -- The x-ray which was done was reviewed and there is good healing of the fracture and from the orthopedic point of view he has healed well and they have asked him to continue with good control of his diabetes and management by the wound center including using a wound VAC if required. [name] ([id number]) 09/21/2016 -- the patient's wound VAC had been applied 2 days ago, but the black sponge had not been placed laterally under the skin flap and into the area of the right of the knee, where there was a large cavity. On examination, after removal of his wound VAC, there is a large collection of seropurulent fluid and hematoma in the area starting at the lateral knee and going down to his right lateral compartment of the leg. Clinically, this is a huge infected abscess cavity 10/01/2016 -- patient was admitted between 09/21/2016 and 09/23/2016 with cellulitis of the right lower extremity with a large infected hematoma which was drained at the bedside by surgical services. he was treated with Levaquin and continues to be on Xarelto. the patient is back on a wound VAC which has not been functioning due to the large depth of the hematoma and organized clots. He is still on Keflex orally. Addendum : I spoke to his surgeon Dr. Tama High and we discussed options for further draining this large hematoma which I suspect will get infected sooner than later. I expressed the fact that the wound VAC does not seem to be functioning well and competent enough to drain this large cavity. He will call for him in the office. [id number] 2018 -- the patient has hypotension in the sitting position and is a bit lethargic as compared to his normal self. I spoke personally to his nurse at the nursing homes, Ms Judson Roch and discussed his management including the fact that he had to have a surgical consultation for the  right lower leg, with Dr. Tama High. Prior to the patient's discharge from the wound center he continues to have hypotension and hence I'm going to talk to the ER physician and have him evaluated in the ER. 10/15/2016 -- the patient was admitted to the hospital between 10/08/2016 and 10/12/2016. He was treated for hypokalemia, hyponatremia, renal insufficiency, sepsis and possible abscess of the right lower leg. he was seen by surgical services and the fluid collection was drained with a Yankauer suction from the knee wound and the opinion of the surgeons was that he would not need any surgical intervention and continue with local wound care. I had personally communicated with Dr. Tama High regarding this. the patient is still on Xarelto. During the admission he was also seen by Dr. Ola Spurr for infectious disease consult and he recommended continue vancomycin for MRSA and change meropenem to ceftriaxone for this Proteus. He recommended a four-week course of antibiotic therapy and follow-up back with Dr. Ola Spurr in 4 weeks. before his discharge since there was clinical improvement Dr. Ola Spurr converted the treatment to oral clindamycin and oral Keflex for a four-week course of antibiotic therapy. He was to be monitored for C. difficile. 10/29/2016 -- he missed his appointment with Dr. Rosana Hoes at the surgical office. He continues to be on oral antibiotics as per Dr. Ola Spurr. 11/16/16 patient's wound on the right knee continues to trouble him. He tells me that the nursing facility has not changed this dressing  in almost a week and his wound is doing somewhat worse today in regard to undermining compared to last week's evaluation. He continues to be frustrated with this. Unfortunately he has tried to switch facilities but was not able to get into any other so far. 11/30/16 Patient's wound appears to be doing well on evaluation today. Fortunately he is tolerating the Iodoform packing  which it does sound the facility is performing appropriately. Nonetheless he still continues to have some discomfort there is no evidence of infection today. Electronic Signature(s) Signed: 11/30/2016 4:54:08 PM By: Worthy Keeler PA-C Entered By: Worthy Keeler on 11/30/2016 09:15:49 [name], [name] ([id number]) [name] ([id number]) -------------------------------------------------------------------------------- Physical Exam Details Patient Name: [name] Gathers T. Date of Service: 11/30/2016 8:45 AM Medical Record Number: [id number] Patient Account Number: [id number] Date of Birth/Sex: April 26, 1946 (71 y.o. Male) Treating RN: Ahmed Prima Primary Care Provider: [name] Dupre Other Clinician: Referring Provider: [name] Dupre Treating Provider/Extender: Melburn Hake, Niguel Moure Weeks in Treatment: 15 Constitutional Obese and well-hydrated in no acute distress. Respiratory normal breathing without difficulty. clear to auscultation bilaterally. Cardiovascular regular rate and rhythm with normal S1, S2. Psychiatric this patient is able to make decisions and demonstrates good insight into disease process. Alert and Oriented x 3. pleasant and cooperative. Notes Patient's wound appears to be doing well although there is tunneling into the 6 o'clock location he has only minimal discomfort and no signs of erythema surrounding the wound. What he is telling me it does sound like the facility is packing the wound appropriately. Electronic Signature(s) Signed: 11/30/2016 4:54:08 PM By: Worthy Keeler PA-C Entered By: Worthy Keeler on 11/30/2016 09:16:30 [name], [name] ([id number]) -------------------------------------------------------------------------------- Physician Orders Details Patient Name: [name] Gathers T. Date of Service: 11/30/2016 8:45 AM Medical Record Number: [id number] Patient Account Number: [id number] Date of Birth/Sex: July 24, 1945 (71 y.o. 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 o Cleanse wound with mild soap and water Anesthetic Wound #1 Right,Anterior Knee o Topical Lidocaine 4% cream applied to wound bed prior to debridement - Only in the Wound Clinic Primary Wound Dressing Wound #1 Right,Anterior Knee o Iodoform packing Gauze - pack lightly in wound, pack to the depth of the wound Secondary Dressing Wound #1 Right,Anterior Knee o ABD pad o Dry Gauze o Conform/Kerlix o Other - tape Dressing Change Frequency Wound #1 Right,Anterior Knee o Change dressing every day. o Other: - as needed [name], [name] ([id number]) Follow-up Appointments Wound #1 Right,Anterior Knee o Return Appointment in 1 week. Edema Control Wound #1 Right,Anterior Knee o Elevate legs to the level of the heart and pump ankles as often as possible Additional Orders / Instructions Wound #1 Right,Anterior Knee o Increase protein intake. o Activity as tolerated Medications-please add to medication list. Wound #1 Right,Anterior Knee o Other: - Vitamin C, Zinc, MVI Notes I'm going to recommend that we continue with the  Current wound care measures for the next week per the above orders. We will see were  things stand following. If anything worsens in the interim the facility will contact our office for additional recommendations. Electronic Signature(s) Signed: 11/30/2016 4:54:08 PM By: Worthy Keeler PA-C Entered By: Worthy Keeler on 11/30/2016 09:17:02 [name], [name] ([id number]) -------------------------------------------------------------------------------- Problem List Details Patient Name: [name] Gathers T. Date of Service: 11/30/2016 8:45 AM Medical Record Number: [id number] Patient Account Number: [id number] Date of Birth/Sex: Mar 12, 1946 (71 y.o. 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, right knee 08/14/2016 Yes L97.812 Non-pressure chronic ulcer of other part of right lower leg 08/14/2016 Yes with fat layer exposed L89.892 Pressure ulcer of other site, stage 2 08/14/2016 Yes M70.861 Other soft tissue disorders related to use, overuse and 08/14/2016 Yes pressure, right lower leg M71.061 Abscess of bursa, right knee 09/21/2016 Yes Inactive Problems Resolved Problems Electronic Signature(s) Signed: 11/30/2016 4:54:08 PM By: Worthy Keeler PA-C Entered By: Worthy Keeler on 11/30/2016 09:05:40 [name], [name] ([id number]) -------------------------------------------------------------------------------- Progress Note Details Patient Name: [name]. Date of Service: 11/30/2016 8:45 AM Medical Record Number: [id number] Patient Account Number: [id number] Date of Birth/Sex: 1946/04/19 (71 y.o. Male) Treating RN: Ahmed Prima Primary Care Provider: [name] Dupre Other Clinician: Referring Provider: [name] Dupre Treating Provider/Extender:  Melburn Hake, Keshanna Riso Weeks in Treatment: 15 Subjective Chief Complaint Information obtained from Patient Patient is at the clinic for treatment of an open pressure ulcer to the area of the right knee History of Present Illness (HPI) The following HPI elements were documented for the patient's wound: Location: right knee swelling with skin ulceration Quality: Patient reports experiencing a dull pain to affected area(s). Severity: Patient states wound are getting worse. Duration: Patient has had the wound for Timing: Pain in wound is Intermittent (comes and goes Context: The wound occurred when the patient had a cast placed over his right lower extremity and with the cast was removed he had a swelling of the knee with problems with his skin Modifying Factors: Other treatment(s) tried include:orthopedics Associated Signs and Symptoms: swelling of both lower extremities with large amount of swelling of the knee 71 year old gentleman who has been living in an independent living facility for long-term care has had recurrent falls and recently had a large hematoma on the right knee. Known to be on aspirin and Plavix for history of CABG and was seen in the ER at the end of February after a fall. past medical history of arthritis, congestive heart failure, diabetes mellitus, hypertension, status post CABG o2, status post cholecystectomy, hernia repair and kidney surgery. recent workup for the fall showed negative clinical findings in his extremities and it was thought that he was falling due to deconditioning. a x-ray showed a right fibular neck fracture and was placed in a splint. he was asked to be seen by orthopedic surgeon. the patient cannot tell me which orthopedic doctor had seen him and I'm not able to get any notes from [place], regarding his orthopedic care. The patient says twice during this last week he has had bloody fluid shooting out of his knee and has drained all over his boot and the  flow. 08/21/2016 -- he was seen by the nurse practitioner at the facility and and  distend she aspirated about 60 mL of hemorrhagic fluid from his right knee. 08/31/2016 -- since I saw the patient last he was admitted to the hospital at [facility] between 08/23/2016 2 08/27/2016 and was treated with a pulmonary embolism. he had EKOS complete, was started on Xarelto, and also treated for chronic diastolic congestive heart failure. he was asked to follow-up with the wound center and continue management locally. 09/07/2016 -- he has an appointment to see his orthopedic surgeon later this week. 09/14/2016 -- he did see the orthopedic service and they have asked him to continue with physical therapy. They discontinued the boot and the x-ray done showed a healed fracture. They said it was okay to use a [name], [name] ([id number]) wound VAC if needed. Addendum -- we got the note from the Orthopedic office where he was seen by [facility] orthopedics on [facility] in [place] and was seen by the PA Carlynn Spry. -- The x-ray which was done was reviewed and there is good healing of the fracture and from the orthopedic point of view he has healed well and they have asked him to continue with good control of his diabetes and management by the wound center including using a wound VAC if required. 09/21/2016 -- the patient's wound VAC had been applied 2 days ago, but the black sponge had not been placed laterally under the skin flap and into the area of the right of the knee, where there was a large cavity. On examination, after removal of his wound VAC, there is a large collection of seropurulent fluid and hematoma in the area starting at the lateral knee and going down to his right lateral compartment of the leg. Clinically, this is a huge infected abscess cavity 10/01/2016 -- patient was admitted between 09/21/2016 and 09/23/2016 with cellulitis of the right lower extremity with a large infected  hematoma which was drained at the bedside by surgical services. he was treated with Levaquin and continues to be on Xarelto. the patient is back on a wound VAC which has not been functioning due to the large depth of the hematoma and organized clots. He is still on Keflex orally. Addendum : I spoke to his surgeon Dr. Tama High and we discussed options for further draining this large hematoma which I suspect will get infected sooner than later. I expressed the fact that the wound VAC does not seem to be functioning well and competent enough to drain this large cavity. He will call for him in the office. [id number] 2018 -- the patient has hypotension in the sitting position and is a bit lethargic as compared to his normal self. I spoke personally to his nurse at the nursing homes, Ms Judson Roch and discussed his management including the fact that he had to have a surgical consultation for the right lower leg, with Dr. Tama High. Prior to the patient's discharge from the wound center he continues to have hypotension and hence I'm going to talk to the ER physician and have him evaluated in the ER. 10/15/2016 -- the patient was admitted to the hospital between 10/08/2016 and 10/12/2016. He was treated for hypokalemia, hyponatremia, renal insufficiency, sepsis and possible abscess of the right lower leg. he was seen by surgical services and the fluid collection was drained with a Yankauer suction from the knee wound and the opinion of the surgeons was that he would not need any surgical intervention and continue with local wound care. I had personally communicated with Dr. Corene Cornea  Davis regarding this. the patient is still on Xarelto. During the admission he was also seen by Dr. Ola Spurr for infectious disease consult and he recommended continue vancomycin for MRSA and change meropenem to ceftriaxone for this Proteus. He recommended a four-week course of antibiotic therapy and follow-up back with Dr.  Ola Spurr in 4 weeks. before his discharge since there was clinical improvement Dr. Ola Spurr converted the treatment to oral clindamycin and oral Keflex for a four-week course of antibiotic therapy. He was to be monitored for C. difficile. 10/29/2016 -- he missed his appointment with Dr. Rosana Hoes at the surgical office. He continues to be on oral antibiotics as per Dr. Ola Spurr. 11/16/16 patient's wound on the right knee continues to trouble him. He tells me that the nursing facility has not changed this dressing in almost a week and his wound is doing somewhat worse today in regard to undermining compared to last week's evaluation. He continues to be frustrated with this. Unfortunately he has tried to switch facilities but was not able to get into any other so far. 11/30/16 Patient's wound appears to be doing well on evaluation today. Fortunately he is tolerating the [name], [name] ([id number]) Iodoform packing which it does sound the facility is performing appropriately. Nonetheless he still continues to have some discomfort there is no evidence of infection today. Objective Constitutional Obese and well-hydrated in no acute distress. Vitals Time Taken: 8:42 AM, Height: 67 in, Weight: 357 lbs, BMI: 55.9, Temperature: 99.1 F, Pulse: 75 bpm, Respiratory Rate: 16 breaths/min, Blood Pressure: 106/49 mmHg. Respiratory normal breathing without difficulty. clear to auscultation bilaterally. Cardiovascular regular rate and rhythm with normal S1, S2. Psychiatric this patient is able to make decisions and demonstrates good insight into disease process. Alert and Oriented x 3. pleasant and cooperative. General Notes: Patient's wound appears to be doing well although there is tunneling into the 6 o'clock location he has only minimal discomfort and no signs of erythema surrounding the wound. What he is telling me it does sound like the facility is packing the wound appropriately. Integumentary  (Hair, Skin) Wound #1 status is Open. Original cause of wound was Pressure Injury. The wound is located on the Right,Anterior Knee. The wound measures 0.3cm length x 1cm width x 0.3cm depth; 0.236cm^2 area and 0.071cm^3 volume. There is Fat Layer (Subcutaneous Tissue) Exposed exposed. There is no tunneling noted, however, there is undermining starting at 6:00 and ending at :00 with a maximum distance of 4.5cm. There is a large amount of serosanguineous drainage noted. The wound margin is flat and intact. There is large (67-100%) red granulation within the wound bed. There is no necrotic tissue within the wound bed. The periwound skin appearance exhibited: Erythema. The periwound skin appearance did not exhibit: Callus, Crepitus, Excoriation, Induration, Rash, Scarring, Dry/Scaly, Maceration, Atrophie Blanche, Cyanosis, Ecchymosis, Hemosiderin Staining, Mottled, Pallor, Rubor. The surrounding wound skin color is noted with erythema which is circumferential. Periwound temperature was noted as No Abnormality. The periwound has tenderness on palpation. 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 Cleanse wound with mild  soap and water Anesthetic: Wound #1 Right,Anterior Knee: Topical Lidocaine 4% cream applied to wound bed prior to debridement - Only in the Wound Clinic Primary Wound Dressing: Wound #1 Right,Anterior Knee: Iodoform packing Gauze - pack lightly in wound, pack to the depth of the wound Secondary  Dressing: Wound #1 Right,Anterior Knee: ABD pad Dry Gauze Conform/Kerlix Other - tape Dressing Change Frequency: Wound #1 Right,Anterior Knee: Change dressing every day. Other: - as needed Follow-up Appointments: Wound #1 Right,Anterior Knee: Return Appointment in 1 week. Edema Control: Wound #1 Right,Anterior Knee: Elevate legs to the level of the heart and pump ankles as often as possible Additional Orders / Instructions: Wound #1 Right,Anterior Knee: Increase protein intake. [name], [name] ([id number]) Activity as tolerated Medications-please add to medication list.: Wound #1 Right,Anterior Knee: Other: - Vitamin C, Zinc, MVI General Notes: I'm going to recommend that we continue with the Current wound care measures for the next week per the above orders. We will see were things stand following. If anything worsens in the interim the facility will contact our office for additional recommendations. Electronic Signature(s) Signed: 11/30/2016 4:54:08 PM By: Worthy Keeler PA-C Entered By: Worthy Keeler on 11/30/2016 09:17:11 [name], [name] ([id number]) -------------------------------------------------------------------------------- SuperBill Details Patient Name: [name] Gathers T. Date of Service: 11/30/2016 Medical Record Number: [id number] Patient Account Number: [id number] Date of Birth/Sex: 1945-10-13 (71 y.o. 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 L89.892 Pressure ulcer of other site, stage 2 M70.861 Other soft tissue disorders related to use, overuse and pressure, right  lower leg M71.061 Abscess of bursa, right knee Facility Procedures CPT4 Code: [id number] Description: 99213 - WOUND CARE VISIT-LEV 3 EST PT Modifier: Quantity: 1 Physician Procedures CPT4: Description Modifier Quantity Code [id number] - WC PHYS LEVEL 3 - EST PT 1 ICD-10 Description Diagnosis 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

## 2016-12-07 ENCOUNTER — Encounter: Payer: Medicare Other | Admitting: Physician Assistant

## 2016-12-07 DIAGNOSIS — E11622 Type 2 diabetes mellitus with other skin ulcer: Secondary | ICD-10-CM | POA: Diagnosis not present

## 2016-12-07 NOTE — Progress Notes (Signed)
[name] ([id number]) Visit Report for 12/07/2016 Chief Complaint Document Details Patient Name: [name], [name]. Date of Service: 12/07/2016 8:45 AM Medical Record Number: [id number] Patient Account Number: [id number] Date of Birth/Sex: 12/04/1945 (71 y.o. Male) Treating RN: [name] Primary Care Provider: [name] Dupre Other Clinician: Referring Provider: [name] Dupre Treating Provider/Extender: Melburn Hake, Lubertha Leite Weeks in Treatment: 16 Information Obtained from: Patient Chief Complaint Patient is at the clinic for treatment of an open pressure ulcer to the area of the right knee Electronic Signature(s) Signed: 12/07/2016 3:36:45 PM By: Worthy Keeler PA-C Entered By: Worthy Keeler on 12/07/2016 09:02:39 [name] ([id number]) -------------------------------------------------------------------------------- HPI Details Patient Name: [name]. Date of Service: 12/07/2016 8:45 AM Medical Record Number: [id number] Patient Account Number: [id number] Date of Birth/Sex: Aug 25, 1945 (71 y.o. Male) Treating RN: [name] Primary Care Provider: [name] Dupre Other Clinician: Referring Provider: [name] Dupre Treating Provider/Extender: Melburn Hake, Bernhard Koskinen Weeks in Treatment: 16 History of Present Illness Location: right knee swelling with skin ulceration Quality: Patient reports experiencing a dull [name] to affected area(s). Severity: Patient states wound are getting worse. Duration: Patient has had the wound for <4 weeks prior to presenting for treatment Timing: [name] in wound is Intermittent (comes and goes Context: The wound occurred when the patient had a cast placed over his right lower extremity and with the cast was removed he had a swelling of the knee with problems with his skin Modifying Factors: Other treatment(s) tried include:orthopedics Associated Signs and Symptoms: swelling of both lower extremities with large amount of swelling  of the knee HPI Description: 71 year old gentleman who has been living in an independent living facility for long-term care has had recurrent falls and recently had a large hematoma on the right knee. Known to be on aspirin and Plavix for history of CABG and was seen in the ER at the end of February after a fall. past medical history of arthritis, congestive heart failure, diabetes mellitus, hypertension, status post CABG o2, status post cholecystectomy, hernia repair and kidney surgery. recent workup for the fall showed negative clinical findings in his extremities and it was thought that he was falling due to deconditioning. a x-ray showed a right fibular neck fracture and was placed in a splint. he was asked to be seen by orthopedic surgeon. the patient cannot tell me which orthopedic doctor had seen him and I'm not able to get any notes from [place], regarding his orthopedic care. The patient says twice during this last week he has had bloody fluid shooting out of his knee and has drained all over his boot and the flow. 08/21/2016 -- he was seen by the nurse practitioner at the facility and and distend she aspirated about 60 mL of hemorrhagic fluid from his right knee. 08/31/2016 -- since I saw the patient last he was admitted to the hospital at [facility] between 08/23/2016 2 08/27/2016 and was treated with a pulmonary embolism. he had EKOS complete, was started on Xarelto, and also treated for chronic diastolic congestive heart failure. he was asked to follow-up with the wound center and continue management locally. 09/07/2016 -- he has an appointment to see his orthopedic surgeon later this week. 09/14/2016 -- he did see the orthopedic service and they have asked him to continue with physical therapy. They discontinued the boot and the x-ray done showed a healed fracture. They said it was okay to use a wound VAC if needed. Addendum -- we got the note from the Orthopedic office  where he was  seen by [facility] orthopedics on [facility] in [place] and was seen by the PA Carlynn Spry. -- The x-ray which was done was reviewed and there is good healing of the fracture and from the orthopedic point of view he has healed well and they have asked him to continue with good control of his diabetes and management by the wound center including using a wound VAC if required. [name] ([id number]) 09/21/2016 -- the patient's wound VAC had been applied 2 days ago, but the black sponge had not been placed laterally under the skin flap and into the area of the right of the knee, where there was a large cavity. On examination, after removal of his wound VAC, there is a large collection of seropurulent fluid and hematoma in the area starting at the lateral knee and going down to his right lateral compartment of the leg. Clinically, this is a huge infected abscess cavity 10/01/2016 -- patient was admitted between 09/21/2016 and 09/23/2016 with cellulitis of the right lower extremity with a large infected hematoma which was drained at the bedside by surgical services. he was treated with Levaquin and continues to be on Xarelto. the patient is back on a wound VAC which has not been functioning due to the large depth of the hematoma and organized clots. He is still on Keflex orally. Addendum : I spoke to his surgeon Dr. Tama High and we discussed options for further draining this large hematoma which I suspect will get infected sooner than later. I expressed the fact that the wound VAC does not seem to be functioning well and competent enough to drain this large cavity. He will call for him in the office. [id number] 2018 -- the patient has hypotension in the sitting position and is a bit lethargic as compared to his normal self. I spoke personally to his nurse at the nursing homes, Ms Judson Roch and discussed his management including the fact that he had to have a surgical consultation for the  right lower leg, with Dr. Tama High. Prior to the patient's discharge from the wound center he continues to have hypotension and hence I'm going to talk to the ER physician and have him evaluated in the ER. 10/15/2016 -- the patient was admitted to the hospital between 10/08/2016 and 10/12/2016. He was treated for hypokalemia, hyponatremia, renal insufficiency, sepsis and possible abscess of the right lower leg. he was seen by surgical services and the fluid collection was drained with a Yankauer suction from the knee wound and the opinion of the surgeons was that he would not need any surgical intervention and continue with local wound care. I had personally communicated with Dr. Tama High regarding this. the patient is still on Xarelto. During the admission he was also seen by Dr. Ola Spurr for infectious disease consult and he recommended continue vancomycin for MRSA and change meropenem to ceftriaxone for this Proteus. He recommended a four-week course of antibiotic therapy and follow-up back with Dr. Ola Spurr in 4 weeks. before his discharge since there was clinical improvement Dr. Ola Spurr converted the treatment to oral clindamycin and oral Keflex for a four-week course of antibiotic therapy. He was to be monitored for C. difficile. 10/29/2016 -- he missed his appointment with Dr. Rosana Hoes at the surgical office. He continues to be on oral antibiotics as per Dr. Ola Spurr. 11/16/16 patient's wound on the right knee continues to trouble him. He tells me that the nursing facility has not changed this dressing  in almost a week and his wound is doing somewhat worse today in regard to undermining compared to last week's evaluation. He continues to be frustrated with this. Unfortunately he has tried to switch facilities but was not able to get into any other so far. 11/30/16 Patient's wound appears to be doing well on evaluation today. Fortunately he is tolerating the Iodoform packing  which it does sound the facility is performing appropriately. Nonetheless he still continues to have some discomfort there is no evidence of infection today. 12/07/16 on evaluation today patient appears to be doing fairly well in regard to his wound externally although it is somewhat deeper than on previous evaluation. Obviously this seems to be a result of over packing based on what we are seeing here in the office today. I'm happy they are packing the wound but at Ivy, Toshiro T. ([id number]) the same time we meet need to be sure not to overpack which can worsen the depth which appears to be what happened. Fortunately there's no evidence of infection. Electronic Signature(s) Signed: 12/07/2016 3:36:45 PM By: Worthy Keeler PA-C Entered By: Worthy Keeler on 12/07/2016 09:12:02 [name], [name] ([id number]) -------------------------------------------------------------------------------- Physical Exam Details Patient Name: [name] Gathers T. Date of Service: 12/07/2016 8:45 AM Medical Record Number: [id number] Patient Account Number: [id number] Date of Birth/Sex: [birth year] (71 y.o. Male) Treating RN: [name] Primary Care Provider: [name] Dupre Other Clinician: Referring Provider: [name] Dupre Treating Provider/Extender: Melburn Hake, Tagan Bartram Weeks in Treatment: 16 Constitutional Obese and well-hydrated in no acute distress. Respiratory normal breathing without difficulty. clear to auscultation bilaterally. Cardiovascular regular rate and rhythm with normal S1, S2. Psychiatric this patient is able to make decisions and demonstrates good insight into disease process. Alert and Oriented x 3. pleasant and cooperative. Notes Patient has no erythema surrounding the wound and overall he does not appear to be painful to palpation either. Electronic Signature(s) Signed: 12/07/2016 3:36:45 PM By: Worthy Keeler PA-C Entered By: Worthy Keeler on 12/07/2016 09:13:28 [name] ([id number]) -------------------------------------------------------------------------------- Physician Orders Details Patient Name: [name] Gathers T. Date of Service: 12/07/2016 8:45 AM Medical Record Number: [id number] Patient Account Number: [id number] Date of Birth/Sex: July 02, 1945 (72 y.o. 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 o Cleanse wound with mild soap and water Anesthetic Wound #1 Right,Anterior Knee o Topical Lidocaine 4% cream applied to wound bed prior to debridement - Only in the Wound Clinic Primary Wound Dressing Wound #1 Right,Anterior Knee o Iodoform packing Gauze - pack lightly in wound (ONLY ONE SINGLE LAYER) (PACK ONLY AT 4.8 CM ONLY AND LEAVE A TAIL TO REMOVE) PLEASE FOLLOW THESE ORDERS Secondary Dressing Wound #1 Right,Anterior Knee o ABD pad o Dry Gauze o Conform/Kerlix o Other - tape Dressing Change Frequency Wound #1 Right,Anterior Knee Geurin, Duvall T. ([id number]) o Change dressing every day. o Other: - as needed Follow-up Appointments Wound #1  Right,Anterior Knee o Return Appointment in 1 week. Edema Control Wound #1 Right,Anterior Knee o Elevate legs to the level of the heart and pump ankles  as often as possible Additional Orders / Instructions Wound #1 Right,Anterior Knee o Increase protein intake. o Activity as tolerated Medications-please add to medication list. Wound #1 Right,Anterior Knee o Other: - Vitamin C, Zinc, MVI Notes I am going to recommend that we continue with the packing per the above wound care orders for the next week. However I am also sending very specific orders on the amount to be packed in order to avoid this wound being over packing thereby worsening is undermining/tunneling. We will see were things stand in one week. In the future my suggestion would be that whatever the depth of his wound measures when he comes in for his appointment that week that we write for them to pack 0.5 cm longer than that in order to have a table for removal. Patient has any other concerns in the meantime he will contact our office Electronic Signature(s) Signed: 12/07/2016 3:36:45 PM By: Worthy Keeler PA-C Entered By: Worthy Keeler on 12/07/2016 09:15:01 [name], [name] ([id number]) -------------------------------------------------------------------------------- Problem List Details Patient Name: [name] Gathers T. Date of Service: 12/07/2016 8:45 AM Medical Record Number: [id number] Patient Account Number: [id number] Date of Birth/Sex: 12-15-1945 (71 y.o. 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, right knee 08/14/2016 Yes L97.812 Non-pressure chronic ulcer of other part of right lower leg 08/14/2016 Yes with fat layer exposed L89.892 Pressure ulcer of other site, stage 2 08/14/2016 Yes M70.861 Other soft tissue disorders related to  use, overuse and 08/14/2016 Yes pressure, right lower leg M71.061 Abscess of bursa, right knee 09/21/2016 Yes Inactive Problems Resolved Problems Electronic Signature(s) Signed: 12/07/2016 3:36:45 PM By: Worthy Keeler PA-C Entered By: Worthy Keeler on 12/07/2016 09:02:26 [name], [name] ([id number]) -------------------------------------------------------------------------------- Progress Note Details Patient Name: [name]. Date of Service: 12/07/2016 8:45 AM Medical Record Number: [id number] Patient Account Number: [id number] Date of Birth/Sex: 04-Oct-1945 (71 y.o. Male) Treating RN: [name] Primary Care Provider: [name] Dupre Other Clinician: Referring Provider: [name] Dupre Treating Provider/Extender: Melburn Hake, Geri Hepler Weeks in Treatment: 16 Subjective Chief Complaint Information obtained from Patient Patient is at the clinic for treatment of an open pressure ulcer to the area of the right knee History of Present Illness (HPI) The following HPI elements were documented for the patient's wound: Location: right knee swelling with skin ulceration Quality: Patient reports experiencing a dull [name] to affected area(s). Severity: Patient states wound are getting worse. Duration: Patient has had the wound for Timing: [name] in wound is Intermittent (comes and goes Context: The wound occurred when the patient had a cast placed over his right lower extremity and with the cast was removed he had a swelling of the knee with problems with his skin Modifying Factors: Other treatment(s) tried include:orthopedics Associated Signs and Symptoms: swelling of both lower extremities with large amount of swelling of the knee 71 year old gentleman who has been living in an independent living facility for long-term care has had recurrent falls and recently had a large hematoma on the right knee. Known to be on aspirin and Plavix for history of CABG and was seen in the ER at the  end of February after a fall. past medical history of arthritis, congestive heart failure, diabetes mellitus, hypertension, status post CABG o2, status post cholecystectomy, hernia repair and kidney surgery. recent workup for the fall showed negative clinical findings in his extremities and it was thought that he was falling due to deconditioning. a x-ray showed a right fibular neck fracture and was placed in a splint. he was asked to be seen by orthopedic surgeon. the patient cannot tell me which orthopedic doctor had seen him and I'm not able to get any notes from [place], regarding his orthopedic care. The patient says twice during this last week he has had bloody fluid shooting out of his knee and has drained all over his boot and the flow. 08/21/2016 -- he was seen by the nurse practitioner at the facility and and distend she aspirated about 60 mL of hemorrhagic fluid from his right knee. 08/31/2016 -- since I saw the patient last he was admitted to the hospital at [facility] between 08/23/2016 2 08/27/2016 and was treated with a pulmonary embolism. he had EKOS complete, was started on Xarelto, and also treated for chronic diastolic congestive heart failure. he was asked to follow-up with the wound center and continue management locally. 09/07/2016 -- he has an appointment to see his orthopedic surgeon later this week. 09/14/2016 -- he did see the orthopedic service and they have asked him to continue with physical therapy. They discontinued the boot and the x-ray done showed a healed fracture. They said it was okay to use a [name], [name] ([id number]) wound VAC if needed. Addendum -- we got the note from the Orthopedic office where he was seen by [facility] orthopedics on [facility] in [place] and was seen by the PA Carlynn Spry. -- The x-ray which was done was reviewed and there is good healing of the fracture and from the orthopedic point of view he has healed well and they  have asked him to continue with good control of his diabetes and management by the wound center including using a wound VAC if required. 09/21/2016 -- the patient's wound VAC had been applied 2 days ago, but the black sponge had not been placed laterally under the skin flap and into the area of the right of the knee, where there was a large cavity. On examination, after removal of his wound VAC, there is a large collection of seropurulent fluid and hematoma in the area starting at the lateral knee and going down to his right lateral compartment of the leg. Clinically, this is a huge infected abscess cavity 10/01/2016 -- patient was admitted between 09/21/2016 and 09/23/2016 with cellulitis of the right lower extremity with a large infected hematoma which was drained at the bedside by surgical services. he was treated with Levaquin and continues to be on Xarelto. the patient is back on a wound VAC which has not been functioning due to the large depth of the hematoma and organized clots. He is still on Keflex orally. Addendum : I spoke to his surgeon Dr. Tama High and we discussed options for further draining this large hematoma which I suspect will get infected sooner than later. I expressed the fact that the wound VAC does not seem to be functioning well and competent enough to drain this large cavity. He will call for him in the office. [id number] 2018 -- the patient has hypotension in the sitting position and is a bit lethargic as compared to his normal self. I spoke personally  to his nurse at the nursing homes, Ms Judson Roch and discussed his management including the fact that he had to have a surgical consultation for the right lower leg, with Dr. Tama High. Prior to the patient's discharge from the wound center he continues to have hypotension and hence I'm going to talk to the ER physician and have him evaluated in the ER. 10/15/2016 -- the patient was admitted to the hospital between 10/08/2016  and 10/12/2016. He was treated for hypokalemia, hyponatremia, renal insufficiency, sepsis and possible abscess of the right lower leg. he was seen by surgical services and the fluid collection was drained with a Yankauer suction from the knee wound and the opinion of the surgeons was that he would not need any surgical intervention and continue with local wound care. I had personally communicated with Dr. Tama High regarding this. the patient is still on Xarelto. During the admission he was also seen by Dr. Ola Spurr for infectious disease consult and he recommended continue vancomycin for MRSA and change meropenem to ceftriaxone for this Proteus. He recommended a four-week course of antibiotic therapy and follow-up back with Dr. Ola Spurr in 4 weeks. before his discharge since there was clinical improvement Dr. Ola Spurr converted the treatment to oral clindamycin and oral Keflex for a four-week course of antibiotic therapy. He was to be monitored for C. difficile. 10/29/2016 -- he missed his appointment with Dr. Rosana Hoes at the surgical office. He continues to be on oral antibiotics as per Dr. Ola Spurr. 11/16/16 patient's wound on the right knee continues to trouble him. He tells me that the nursing facility has not changed this dressing in almost a week and his wound is doing somewhat worse today in regard to undermining compared to last week's evaluation. He continues to be frustrated with this. Unfortunately he has tried to switch facilities but was not able to get into any other so far. 11/30/16 Patient's wound appears to be doing well on evaluation today. Fortunately he is tolerating the [name], [name] ([id number]) Iodoform packing which it does sound the facility is performing appropriately. Nonetheless he still continues to have some discomfort there is no evidence of infection today. 12/07/16 on evaluation today patient appears to be doing fairly well in regard to his wound  externally although it is somewhat deeper than on previous evaluation. Obviously this seems to be a result of over packing based on what we are seeing here in the office today. I'm happy they are packing the wound but at the same time we meet need to be sure not to overpack which can worsen the depth which appears to be what happened. Fortunately there's no evidence of infection. Objective Constitutional Obese and well-hydrated in no acute distress. Vitals Time Taken: 8:45 AM, Height: 67 in, Weight: 357 lbs, BMI: 55.9, Temperature: 98.6 F, Pulse: 79 bpm, Respiratory Rate: 16 breaths/min, Blood Pressure: 130/66 mmHg. Respiratory normal breathing without difficulty. clear to auscultation bilaterally. Cardiovascular regular rate and rhythm with normal S1, S2. Psychiatric this patient is able to make decisions and demonstrates good insight into disease process. Alert and Oriented x 3. pleasant and cooperative. General Notes: Patient has no erythema surrounding the wound and overall he does not appear to be painful to palpation either. Integumentary (Hair, Skin) Wound #1 status is Open. Original cause of wound was Pressure Injury. The wound is located on the Right,Anterior Knee. The wound measures 0.3cm length x 0.8cm width x 0.2cm depth; 0.188cm^2 area and 0.038cm^3 volume. There is Fat Layer (Subcutaneous Tissue)  Exposed exposed. There is no tunneling noted, however, there is undermining starting at 5:00 and ending at 6:00 with a maximum distance of 4.9cm. There is a large amount of serosanguineous drainage noted. The wound margin is flat and intact. There is large (67-100%) red granulation within the wound bed. There is no necrotic tissue within the wound bed. The periwound skin appearance exhibited: Erythema. The periwound skin appearance did not exhibit: Callus, Crepitus, Excoriation, Induration, Rash, Scarring, Dry/Scaly, Maceration, Atrophie Blanche, Cyanosis, Ecchymosis, Hemosiderin  Staining, Mottled, Pallor, Rubor. The surrounding wound skin color is [name], [name] ([id number]) noted with erythema which is circumferential. Periwound temperature was noted as No Abnormality. The periwound has tenderness on palpation. 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 Cleanse wound with mild soap and water Anesthetic: Wound #1 Right,Anterior Knee: Topical Lidocaine 4% cream applied to wound bed prior to debridement - Only in the Wound Clinic Primary Wound Dressing: Wound #1 Right,Anterior Knee: Iodoform packing Gauze - pack lightly in wound (ONLY ONE SINGLE LAYER) (PACK ONLY AT 4.8 CM ONLY AND LEAVE A TAIL TO REMOVE) PLEASE FOLLOW THESE ORDERS Secondary Dressing: Wound #1 Right,Anterior Knee: ABD pad Dry Gauze Conform/Kerlix Other - tape Dressing Change Frequency: Wound #1 Right,Anterior Knee: Change dressing every day. Other: - as needed Follow-up Appointments: Wound #1 Right,Anterior Knee: Return Appointment in 1 week. Edema Control: [name], [name]. ([id number]) Wound #1 Right,Anterior Knee: Elevate legs to the level of the heart and pump ankles as often as possible Additional Orders / Instructions: Wound #1 Right,Anterior Knee: Increase protein intake. Activity as tolerated Medications-please add to medication list.: Wound #1 Right,Anterior Knee: Other: - Vitamin C, Zinc, MVI General Notes: I am going to recommend that we continue with the packing per the above wound care orders for the next week. However I am  also sending very specific orders on the amount to be packed in order to avoid this wound being over packing thereby worsening is undermining/tunneling. We will see were things stand in one week. In the future my suggestion would be that whatever the depth of his wound measures when he comes in for his appointment that week that we write for them to pack 0.5 cm longer than that in order to have a table for removal. Patient has any other concerns in the meantime he will contact our office Electronic Signature(s) Signed: 12/07/2016 3:36:45 PM By: Worthy Keeler PA-C Entered By: Worthy Keeler on 12/07/2016 09:15:10 [name], [name] ([id number]) -------------------------------------------------------------------------------- SuperBill Details Patient Name: [name] Gathers T. Date of Service: 12/07/2016 Medical Record Number: [id number] Patient Account Number: [id number] Date of Birth/Sex: 04/09/1946 (71 y.o. 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 L89.892 Pressure ulcer of other site, stage 2 M70.861 Other soft tissue disorders related to use, overuse and  pressure, right lower leg M71.061 Abscess of bursa, right knee Facility Procedures CPT4 Code: 94320037 Description: 99213 - WOUND CARE VISIT-LEV 3 EST PT Modifier: Quantity: 1 Physician Procedures CPT4: Description Modifier Quantity Code 9444619 01222 - WC PHYS LEVEL 3 - EST PT 1 ICD-10 Description Diagnosis 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

## 2016-12-07 NOTE — Progress Notes (Signed)
[name] ([id number]) Visit Report for 12/07/2016 Arrival Information Details Patient Name: [name], [name]. Date of Service: 12/07/2016 8:45 AM Medical Record Number: [id number] Patient Account Number: [id number] Date of Birth/Sex: November 21, 1945 (71 y.o. Male) Treating RN: Ahmed Prima Primary Care Karn Derk: [name] Dupre Other Clinician: Referring Gwen Edler: [name] Dupre Treating Deacon Gadbois/Extender: Melburn Hake, HOYT Weeks in Treatment: 16 Visit Information History Since Last Visit All ordered tests and consults were completed: No Patient Arrived: Wheel Chair Added or deleted any medications: No Arrival Time: 08:44 Any new allergies or adverse reactions: No Accompanied By: self Had a fall or experienced change in No Transfer Assistance: EasyPivot activities of daily living that may affect Patient Lift risk of falls: Patient Identification Verified: Yes Signs or symptoms of abuse/neglect since last No Secondary Verification Process Yes visito Completed: Hospitalized since last visit: No Patient Requires Transmission- No Has Dressing in Place as Prescribed: Yes Based Precautions: Pain Present Now: No Patient Has Alerts: No Electronic Signature(s) Signed: 12/07/2016 3:50:12 PM By: Alric Quan Entered By: Alric Quan on 12/07/2016 08:45:14 Ebrahim, [name] ([id number]) -------------------------------------------------------------------------------- Clinic Level of Care Assessment Details Patient Name: [name] Date of Service: 12/07/2016 8:45 AM Medical Record Number: [id number] Patient Account Number: [id number] Date of Birth/Sex: Jul 19, 1945 (71 y.o. Male) Treating RN: Ahmed Prima Primary Care Tyrell Brereton: [name] Dupre Other Clinician: Referring Satya Bohall: [name] Dupre Treating Jaydien Panepinto/Extender: Melburn Hake, HOYT Weeks in Treatment: 16 Clinic Level of Care Assessment Items TOOL 4 Quantity Score X - Use when only an EandM is  performed on FOLLOW-UP visit 1 0 ASSESSMENTS - Nursing Assessment / Reassessment X - Reassessment of Co-morbidities (includes updates in patient status) 1 10 X - Reassessment of Adherence to Treatment Plan 1 5 ASSESSMENTS - Wound and Skin Assessment / Reassessment X - Simple Wound Assessment / Reassessment - one wound 1 5 []  - Complex Wound Assessment / Reassessment - multiple wounds 0 []  - Dermatologic / Skin Assessment (not related to wound area) 0 ASSESSMENTS - Focused Assessment []  - Circumferential Edema Measurements - multi extremities 0 []  - Nutritional Assessment / Counseling / Intervention 0 []  - Lower Extremity Assessment (monofilament, tuning fork, pulses) 0 []  - Peripheral Arterial Disease Assessment (using hand held doppler) 0 ASSESSMENTS - Ostomy and/or Continence Assessment and Care []  - Incontinence Assessment and Management 0 []  - Ostomy Care Assessment and Management (repouching, etc.) 0 PROCESS - Coordination of Care []  - Simple Patient / Family Education for ongoing care 0 X - Complex (extensive) Patient / Family Education for ongoing care 1 20 X - Staff obtains Programmer, systems, Records, Test Results / Process Orders 1 10 X - Staff telephones HHA, Nursing Homes / Clarify orders / etc 1 10 []  - Routine Transfer to another Facility (non-emergent condition) 0 Bloodgood, Davon T. ([id number]) []  - Routine Hospital Admission (non-emergent condition) 0 []  - New Admissions / Biomedical engineer / Ordering NPWT, Apligraf, etc. 0 []  - Emergency Hospital Admission (emergent condition) 0 X - Simple Discharge Coordination 1 10 []  - Complex (extensive) Discharge Coordination 0 PROCESS - Special Needs []  - Pediatric / Minor Patient Management 0 []  - Isolation Patient Management 0 []  - Hearing / Language / Visual special needs 0 []  - Assessment of Community assistance (transportation, D/C planning, etc.) 0 []  - Additional assistance / Altered mentation 0 []  - Support Surface(s)  Assessment (bed, cushion, seat, etc.) 0 INTERVENTIONS - Wound Cleansing / Measurement X - Simple Wound Cleansing - one wound 1 5 []  - Complex Wound Cleansing - multiple  wounds 0 X - Wound Imaging (photographs - any number of wounds) 1 5 []  - Wound Tracing (instead of photographs) 0 X - Simple Wound Measurement - one wound 1 5 []  - Complex Wound Measurement - multiple wounds 0 INTERVENTIONS - Wound Dressings []  - Small Wound Dressing one or multiple wounds 0 X - Medium Wound Dressing one or multiple wounds 1 15 []  - Large Wound Dressing one or multiple wounds 0 []  - Application of Medications - topical 0 []  - Application of Medications - injection 0 INTERVENTIONS - Miscellaneous []  - External ear exam 0 Piggott, Chima T. ([id number]) []  - Specimen Collection (cultures, biopsies, blood, body fluids, etc.) 0 []  - Specimen(s) / Culture(s) sent or taken to Lab for analysis 0 []  - Patient Transfer (multiple staff / Harrel Lemon Lift / Similar devices) 0 []  - Simple Staple / Suture removal (25 or less) 0 []  - Complex Staple / Suture removal (26 or more) 0 []  - Hypo / Hyperglycemic Management (close monitor of Blood Glucose) 0 []  - Ankle / Brachial Index (ABI) - do not check if billed separately 0 X - Vital Signs 1 5 Has the patient been seen at the hospital within the last three years: Yes Total Score: 105 Level Of Care: New/Established - Level 3 Electronic Signature(s) Signed: 12/07/2016 3:50:12 PM By: Alric Quan Entered By: Alric Quan on 12/07/2016 09:58:05 Barnard, [name] ([id number]) -------------------------------------------------------------------------------- Encounter Discharge Information Details Patient Name: [name] Gathers T. Date of Service: 12/07/2016 8:45 AM Medical Record Number: [id number] Patient Account Number: [id number] Date of Birth/Sex: 1945/08/19 (71 y.o. Male) Treating RN: Ahmed Prima Primary Care Niang Mitcheltree: [name] Dupre Other Clinician: Referring  Malone Vanblarcom: [name] Dupre Treating Nahla Lukin/Extender: Melburn Hake, HOYT Weeks in Treatment: 16 Encounter Discharge Information Items Discharge Pain Level: 0 Discharge Condition: Stable Ambulatory Status: Wheelchair Discharge Destination: Nursing Home Transportation: Other Accompanied By: self Schedule Follow-up Appointment: Yes Medication Reconciliation completed and provided to Patient/Care No Amaziah Ghosh: Provided on Clinical Summary of Care: 12/07/2016 Form Type Recipient Paper Patient JE Electronic Signature(s) Signed: 12/07/2016 9:19:09 AM By: Ruthine Dose Entered By: Ruthine Dose on 12/07/2016 09:19:08 Devol, [name] ([id number]) -------------------------------------------------------------------------------- Lower Extremity Assessment Details Patient Name: [name] Gathers T. Date of Service: 12/07/2016 8:45 AM Medical Record Number: [id number] Patient Account Number: [id number] Date of Birth/Sex: 1946-03-18 (71 y.o. Male) Treating RN: Ahmed Prima Primary Care Merdith Adan: [name] Dupre Other Clinician: Referring Sweet Jarvis: [name] Dupre Treating Maitland Lesiak/Extender: Melburn Hake, HOYT Weeks in Treatment: 16 Vascular Assessment Pulses: Dorsalis Pedis Palpable: [Right:Yes] Posterior Tibial Extremity colors, hair growth, and conditions: Extremity Color: [Right:Hyperpigmented] Temperature of Extremity: [Right:Warm] Capillary Refill: [Right:> 3 seconds] Toe Nail Assessment Left: Right: Thick: Yes Discolored: Yes Deformed: Yes Improper Length and Hygiene: Yes Electronic Signature(s) Signed: 12/07/2016 3:50:12 PM By: Alric Quan Entered By: Alric Quan on 12/07/2016 08:56:32 Cutshaw, [name] ([id number]) -------------------------------------------------------------------------------- Multi Wound Chart Details Patient Name: [name] Gathers T. Date of Service: 12/07/2016 8:45 AM Medical Record Number: [id number] Patient Account Number: [id number] Date of  Birth/Sex: Dec 15, 1945 (71 y.o. Male) Treating RN: Ahmed Prima Primary Care Stephaie Dardis: [name] Dupre Other Clinician: Referring Conner Muegge: [name] Dupre Treating Kelvin Sennett/Extender: Melburn Hake, HOYT Weeks in Treatment: 16 Vital Signs Height(in): 67 Pulse(bpm): 79 Weight(lbs): 357 Blood Pressure 130/66 (mmHg): Body Mass Index(BMI): 56 Temperature(F): 98.6 Respiratory Rate 16 (breaths/min): Photos: [1:No Photos] [N/A:N/A] Wound Location: [1:Right Knee - Anterior] [N/A:N/A] Wounding Event: [1:Pressure Injury] [N/A:N/A] Primary Etiology: [1:Diabetic Wound/Ulcer of the Lower Extremity] [N/A:N/A] Comorbid History: [1:Anemia, Asthma, Arrhythmia, Congestive Heart Failure, Coronary Artery Disease, Hypertension, Type  II Diabetes, Osteoarthritis] [N/A:N/A] Date Acquired: [1:07/23/2016] [N/A:N/A] Weeks of Treatment: [1:16] [N/A:N/A] Wound Status: [1:Open] [N/A:N/A] Measurements L x W x D 0.3x0.8x0.2 [N/A:N/A] (cm) Area (cm) : [1:0.188] [N/A:N/A] Volume (cm) : [1:0.038] [N/A:N/A] % Reduction in Area: [1:97.60%] [N/A:N/A] % Reduction in Volume: 95.20% [N/A:N/A] Starting Position 1 5 (o'clock): Ending Position 1 [1:6] (o'clock): Maximum Distance 1 4.9 (cm): Undermining: [1:Yes] [N/A:N/A] Classification: [1:Grade 1] [N/A:N/A] Exudate Amount: [1:Large] [N/A:N/A] Exudate Type: [1:Serosanguineous] [N/A:N/A] Exudate Color: red, brown N/A N/A Wound Margin: Flat and Intact N/A N/A Granulation Amount: Large (67-100%) N/A N/A Granulation Quality: Red N/A N/A Necrotic Amount: None Present (0%) N/A N/A Exposed Structures: Fat Layer (Subcutaneous N/A N/A Tissue) Exposed: Yes Fascia: No Tendon: No Muscle: No Joint: No Bone: No Epithelialization: None N/A N/A Periwound Skin Texture: Excoriation: No N/A N/A Induration: No Callus: No Crepitus: No Rash: No Scarring: No Periwound Skin Maceration: No N/A N/A Moisture: Dry/Scaly: No Periwound Skin Color: Erythema: Yes N/A  N/A Atrophie Blanche: No Cyanosis: No Ecchymosis: No Hemosiderin Staining: No Mottled: No Pallor: No Rubor: No Erythema Location: Circumferential N/A N/A Temperature: No Abnormality N/A N/A Tenderness on Yes N/A N/A Palpation: Wound Preparation: Ulcer Cleansing: N/A N/A Rinsed/Irrigated with Saline Topical Anesthetic Applied: Other: Lidocaine 4% Treatment Notes Electronic Signature(s) Signed: 12/07/2016 3:50:12 PM By: Alric Quan Entered By: Alric Quan on 12/07/2016 08:56:45 Stare, [name] ([id number]) -------------------------------------------------------------------------------- [place] Details Patient Name: [name] Gathers T. Date of Service: 12/07/2016 8:45 AM Medical Record Number: [id number] Patient Account Number: [id number] Date of Birth/Sex: 1945-07-25 (71 y.o. Male) Treating RN: Ahmed Prima Primary Care Juanette Urizar: [name] Dupre Other Clinician: Referring Tovia Kisner: [name] Dupre Treating [place] Kuang/Extender: Melburn Hake, HOYT Weeks in Treatment: 16 Active Inactive ` Orientation to the Wound Care Program Nursing Diagnoses: Knowledge deficit related to the wound healing center program Goals: Patient/caregiver will verbalize understanding of the [place] Program Date Initiated: 08/14/2016 Target Resolution Date: 12/14/2016 Goal Status: Active Interventions: Provide education on orientation to the wound center Notes: ` Pressure Nursing Diagnoses: Knowledge deficit related to causes and risk factors for pressure ulcer development Knowledge deficit related to management of pressures ulcers Potential for impaired tissue integrity related to pressure, friction, moisture, and shear Goals: Patient will remain free from development of additional pressure ulcers Date Initiated: 08/14/2016 Target Resolution Date: 12/14/2016 Goal Status: Active Patient will remain free of pressure ulcers Date Initiated:  08/14/2016 Target Resolution Date: 12/14/2016 Goal Status: Active Patient/caregiver will verbalize risk factors for pressure ulcer development Date Initiated: 08/14/2016 Target Resolution Date: 12/14/2016 Goal Status: Active Patient/caregiver will verbalize understanding of pressure ulcer management Date Initiated: 08/14/2016 Target Resolution Date: 12/14/2016 Goal Status: Active ASAHEL, RISDEN ([id number]) Interventions: Assess: immobility, friction, shearing, incontinence upon admission and as needed Assess offloading mechanisms upon admission and as needed Assess potential for pressure ulcer upon admission and as needed Provide education on pressure ulcers Treatment Activities: Patient referred for seating evaluation to ensure proper offloading : 08/14/2016 Pressure reduction/relief device ordered : 08/14/2016 Notes: ` Wound/Skin Impairment Nursing Diagnoses: Impaired tissue integrity Knowledge deficit related to ulceration/compromised skin integrity Goals: Patient/caregiver will verbalize understanding of skin care regimen Date Initiated: 08/14/2016 Target Resolution Date: 12/14/2016 Goal Status: Active Ulcer/skin breakdown will have a volume reduction of 30% by week 4 Date Initiated: 08/14/2016 Target Resolution Date: 12/14/2016 Goal Status: Active Ulcer/skin breakdown will have a volume reduction of 50% by week 8 Date Initiated: 08/14/2016 Target Resolution Date: 12/14/2016 Goal Status: Active Ulcer/skin breakdown will have a volume reduction of 80% by  week 12 Date Initiated: 08/14/2016 Target Resolution Date: 12/14/2016 Goal Status: Active Ulcer/skin breakdown will heal within 14 weeks Date Initiated: 08/14/2016 Target Resolution Date: 12/14/2016 Goal Status: Active Interventions: Assess patient/caregiver ability to obtain necessary supplies Assess patient/caregiver ability to perform ulcer/skin care regimen upon admission and as needed Assess ulceration(s) every  visit Provide education on ulcer and skin care Treatment Activities: Referred to DME [place] Higham for dressing supplies : 08/14/2016 Skin care regimen initiated : 08/14/2016 EXZAVIER, RUDERMAN ([id number]) Topical wound management initiated : 08/14/2016 Notes: Electronic Signature(s) Signed: 12/07/2016 3:50:12 PM By: Alric Quan Entered By: Alric Quan on 12/07/2016 08:56:38 Musil, [name] ([id number]) -------------------------------------------------------------------------------- Pain Assessment Details Patient Name: [name] Gathers T. Date of Service: 12/07/2016 8:45 AM Medical Record Number: [id number] Patient Account Number: [id number] Date of Birth/Sex: April 17, 1946 (71 y.o. Male) Treating RN: Ahmed Prima Primary Care Annebelle Bostic: [name] Dupre Other Clinician: Referring Zachariah Pavek: [name] Dupre Treating Kamdyn Covel/Extender: Melburn Hake, HOYT Weeks in Treatment: 16 Active Problems Location of Pain Severity and Description of Pain Patient Has Paino No Site Locations With Dressing Change: No Pain Management and Medication Current Pain Management: Electronic Signature(s) Signed: 12/07/2016 3:50:12 PM By: Alric Quan Entered By: Alric Quan on 12/07/2016 08:45:22 Rossy, [name] ([id number]) -------------------------------------------------------------------------------- Patient/Caregiver Education Details Patient Name: [name] Date of Service: 12/07/2016 8:45 AM Medical Record Number: [id number] Patient Account Number: [id number] Date of Birth/Gender: [birth year] (71 y.o. Male) Treating RN: Ahmed Prima Primary Care Physician: [name] Dupre Other Clinician: Referring Physician: [name] Dupre Treating Physician/Extender: Sharalyn Ink in Treatment: 16 Education Assessment Education Provided To: Patient Education Topics Provided Wound/Skin Impairment: Handouts: Other: change dressing as ordered Methods: Demonstration,  Explain/Verbal Responses: State content correctly Electronic Signature(s) Signed: 12/07/2016 3:50:12 PM By: Alric Quan Entered By: Alric Quan on 12/07/2016 09:00:07 Avallone, [name] ([id number]) -------------------------------------------------------------------------------- Wound Assessment Details Patient Name: [name] Gathers T. Date of Service: 12/07/2016 8:45 AM Medical Record Number: [id number] Patient Account Number: [id number] Date of Birth/Sex: [birth year] (71 y.o. Male) Treating RN: Ahmed Prima Primary Care Severus Brodzinski: [name] Dupre Other Clinician: Referring Aliyanna Wassmer: [name] Dupre Treating Kathleen Tamm/Extender: Melburn Hake, HOYT Weeks in Treatment: 16 Wound Status Wound Number: 1 Primary Diabetic Wound/Ulcer of the Lower Etiology: Extremity Wound Location: Right Knee - Anterior Wound Open Wounding Event: Pressure Injury Status: Date Acquired: 07/23/2016 Comorbid Anemia, Asthma, Arrhythmia, Weeks Of Treatment: 16 History: Congestive Heart Failure, Coronary Clustered Wound: No Artery Disease, Hypertension, Type II Diabetes, Osteoarthritis Photos Photo Uploaded By: Alric Quan on 12/07/2016 13:21:30 Wound Measurements Length: (cm) 0.3 % Reduction in Width: (cm) 0.8 % Reduction in Depth: (cm) 0.2 Epithelializati Area: (cm) 0.188 Tunneling: Volume: (cm) 0.038 Undermining: Starting Pos Ending Posit Maximum Dist Area: 97.6% Volume: 95.2% on: None No Yes ition (o'clock): 5 ion (o'clock): 6 ance: (cm) 4.9 Wound Description Classification: Grade 1 Foul Odor Afte Wound Margin: Flat and Intact Slough/Fibrino Exudate Amount: Large Exudate Type: Serosanguineous Exudate Color: red, brown Bondar, Javi T. ([id number]) r Cleansing: No No Wound Bed Granulation Amount: Large (67-100%) Exposed Structure Granulation Quality: Red Fascia Exposed: No Necrotic Amount: None Present (0%) Fat Layer (Subcutaneous Tissue) Exposed: Yes Tendon  Exposed: No Muscle Exposed: No Joint Exposed: No Bone Exposed: No Periwound Skin Texture Texture Color No Abnormalities Noted: No No Abnormalities Noted: No Callus: No Atrophie Blanche: No Crepitus: No Cyanosis: No Excoriation: No Ecchymosis: No Induration: No Erythema: Yes Rash: No Erythema Location: Circumferential Scarring: No Hemosiderin Staining: No Mottled: No Moisture Pallor: No No Abnormalities Noted: No Rubor: No Dry / Scaly: No Maceration:  No Temperature / Pain Temperature: No Abnormality Tenderness on Palpation: Yes Wound Preparation Ulcer Cleansing: Rinsed/Irrigated with Saline Topical Anesthetic Applied: Other: Lidocaine 4%, Treatment Notes Wound #1 (Right, Anterior Knee) 1. Cleansed with: Clean wound with Normal Saline 2. Anesthetic Topical Lidocaine 4% cream to wound bed prior to debridement 4. Dressing Applied: Iodoform packing Gauze 5. Secondary Dressing Applied ABD Pad Dry Gauze Kerlix/Conform 7. Secured with Recruitment consultant) Signed: 12/07/2016 3:50:12 PM By: Marvis Repress ([id number]) Entered By: Alric Quan on 12/07/2016 08:55:36 Sen, [name] ([id number]) -------------------------------------------------------------------------------- Vitals Details Patient Name: [name] Gathers T. Date of Service: 12/07/2016 8:45 AM Medical Record Number: [id number] Patient Account Number: [id number] Date of Birth/Sex: [birth year] (71 y.o. Male) Treating RN: Ahmed Prima Primary Care Treston Coker: [name] Dupre Other Clinician: Referring Derica Leiber: [name] Dupre Treating Brent Noto/Extender: Melburn Hake, HOYT Weeks in Treatment: 16 Vital Signs Time Taken: 08:45 Temperature (F): 98.6 Height (in): 67 Pulse (bpm): 79 Weight (lbs): 357 Respiratory Rate (breaths/min): 16 Body Mass Index (BMI): 55.9 Blood Pressure (mmHg): 130/66 Reference Range: 80 - 120 mg / dl Electronic Signature(s) Signed: 12/07/2016  3:50:12 PM By: Alric Quan Entered By: Alric Quan on 12/07/2016 08:46:02

## 2016-12-09 ENCOUNTER — Observation Stay
Admission: EM | Admit: 2016-12-09 | Discharge: 2016-12-11 | Disposition: A | Discharge status: Skilled Nursing Facility | Payer: Medicare Other | Attending: Specialist | Admitting: Specialist

## 2016-12-09 ENCOUNTER — Encounter: Payer: Self-pay | Admitting: Intensive Care

## 2016-12-09 ENCOUNTER — Emergency Department: Payer: Medicare Other

## 2016-12-09 DIAGNOSIS — Z8719 Personal history of other diseases of the digestive system: Secondary | ICD-10-CM | POA: Insufficient documentation

## 2016-12-09 DIAGNOSIS — E872 Acidosis: Secondary | ICD-10-CM | POA: Insufficient documentation

## 2016-12-09 DIAGNOSIS — E1122 Type 2 diabetes mellitus with diabetic chronic kidney disease: Secondary | ICD-10-CM | POA: Diagnosis not present

## 2016-12-09 DIAGNOSIS — N289 Disorder of kidney and ureter, unspecified: Secondary | ICD-10-CM | POA: Diagnosis present

## 2016-12-09 DIAGNOSIS — Z880 Allergy status to penicillin: Secondary | ICD-10-CM | POA: Insufficient documentation

## 2016-12-09 DIAGNOSIS — Z8673 Personal history of transient ischemic attack (TIA), and cerebral infarction without residual deficits: Secondary | ICD-10-CM | POA: Insufficient documentation

## 2016-12-09 DIAGNOSIS — J449 Chronic obstructive pulmonary disease, unspecified: Secondary | ICD-10-CM | POA: Insufficient documentation

## 2016-12-09 DIAGNOSIS — E039 Hypothyroidism, unspecified: Secondary | ICD-10-CM | POA: Insufficient documentation

## 2016-12-09 DIAGNOSIS — K219 Gastro-esophageal reflux disease without esophagitis: Secondary | ICD-10-CM | POA: Insufficient documentation

## 2016-12-09 DIAGNOSIS — N179 Acute kidney failure, unspecified: Secondary | ICD-10-CM | POA: Diagnosis not present

## 2016-12-09 DIAGNOSIS — I248 Other forms of acute ischemic heart disease: Principal | ICD-10-CM | POA: Insufficient documentation

## 2016-12-09 DIAGNOSIS — Z951 Presence of aortocoronary bypass graft: Secondary | ICD-10-CM | POA: Insufficient documentation

## 2016-12-09 DIAGNOSIS — E871 Hypo-osmolality and hyponatremia: Secondary | ICD-10-CM | POA: Diagnosis present

## 2016-12-09 DIAGNOSIS — E876 Hypokalemia: Secondary | ICD-10-CM | POA: Diagnosis present

## 2016-12-09 DIAGNOSIS — F319 Bipolar disorder, unspecified: Secondary | ICD-10-CM | POA: Diagnosis not present

## 2016-12-09 DIAGNOSIS — Z85828 Personal history of other malignant neoplasm of skin: Secondary | ICD-10-CM | POA: Diagnosis not present

## 2016-12-09 DIAGNOSIS — Z881 Allergy status to other antibiotic agents status: Secondary | ICD-10-CM | POA: Insufficient documentation

## 2016-12-09 DIAGNOSIS — I214 Non-ST elevation (NSTEMI) myocardial infarction: Secondary | ICD-10-CM

## 2016-12-09 DIAGNOSIS — M1990 Unspecified osteoarthritis, unspecified site: Secondary | ICD-10-CM | POA: Diagnosis not present

## 2016-12-09 DIAGNOSIS — Z7901 Long term (current) use of anticoagulants: Secondary | ICD-10-CM | POA: Insufficient documentation

## 2016-12-09 DIAGNOSIS — N183 Chronic kidney disease, stage 3 (moderate): Secondary | ICD-10-CM | POA: Diagnosis not present

## 2016-12-09 DIAGNOSIS — Z87891 Personal history of nicotine dependence: Secondary | ICD-10-CM | POA: Diagnosis not present

## 2016-12-09 DIAGNOSIS — I25119 Atherosclerotic heart disease of native coronary artery with unspecified angina pectoris: Secondary | ICD-10-CM | POA: Insufficient documentation

## 2016-12-09 DIAGNOSIS — I130 Hypertensive heart and chronic kidney disease with heart failure and stage 1 through stage 4 chronic kidney disease, or unspecified chronic kidney disease: Secondary | ICD-10-CM | POA: Diagnosis not present

## 2016-12-09 DIAGNOSIS — Z9049 Acquired absence of other specified parts of digestive tract: Secondary | ICD-10-CM | POA: Insufficient documentation

## 2016-12-09 DIAGNOSIS — Z86718 Personal history of other venous thrombosis and embolism: Secondary | ICD-10-CM | POA: Insufficient documentation

## 2016-12-09 DIAGNOSIS — Z6841 Body Mass Index (BMI) 40.0 and over, adult: Secondary | ICD-10-CM | POA: Insufficient documentation

## 2016-12-09 DIAGNOSIS — G4733 Obstructive sleep apnea (adult) (pediatric): Secondary | ICD-10-CM | POA: Insufficient documentation

## 2016-12-09 DIAGNOSIS — E785 Hyperlipidemia, unspecified: Secondary | ICD-10-CM | POA: Diagnosis not present

## 2016-12-09 DIAGNOSIS — Z86711 Personal history of pulmonary embolism: Secondary | ICD-10-CM | POA: Diagnosis not present

## 2016-12-09 DIAGNOSIS — G8929 Other chronic pain: Secondary | ICD-10-CM | POA: Diagnosis not present

## 2016-12-09 DIAGNOSIS — R079 Chest pain, unspecified: Secondary | ICD-10-CM | POA: Diagnosis present

## 2016-12-09 DIAGNOSIS — Z882 Allergy status to sulfonamides status: Secondary | ICD-10-CM | POA: Insufficient documentation

## 2016-12-09 DIAGNOSIS — Z8249 Family history of ischemic heart disease and other diseases of the circulatory system: Secondary | ICD-10-CM | POA: Insufficient documentation

## 2016-12-09 DIAGNOSIS — Z7982 Long term (current) use of aspirin: Secondary | ICD-10-CM | POA: Insufficient documentation

## 2016-12-09 DIAGNOSIS — E669 Obesity, unspecified: Secondary | ICD-10-CM | POA: Insufficient documentation

## 2016-12-09 DIAGNOSIS — I5032 Chronic diastolic (congestive) heart failure: Secondary | ICD-10-CM | POA: Insufficient documentation

## 2016-12-09 DIAGNOSIS — R0789 Other chest pain: Secondary | ICD-10-CM | POA: Insufficient documentation

## 2016-12-09 DIAGNOSIS — Z833 Family history of diabetes mellitus: Secondary | ICD-10-CM | POA: Insufficient documentation

## 2016-12-09 DIAGNOSIS — Z79899 Other long term (current) drug therapy: Secondary | ICD-10-CM | POA: Insufficient documentation

## 2016-12-09 DIAGNOSIS — Z885 Allergy status to narcotic agent status: Secondary | ICD-10-CM | POA: Insufficient documentation

## 2016-12-09 LAB — LIPID PANEL
CHOL/HDL RATIO: 5.4 ratio
CHOLESTEROL: 163 mg/dL (ref 0–200)
HDL: 30 mg/dL — AB (ref >40)
LDL Cholesterol: 78 mg/dL (ref 0–99)
TRIGLYCERIDES: 273 mg/dL — AB (ref <150)
VLDL: 55 mg/dL — ABNORMAL HIGH (ref 0–40)

## 2016-12-09 LAB — BASIC METABOLIC PANEL
ANION GAP: 18 — AB (ref 5–15)
BUN: 56 mg/dL — AB (ref 6–20)
CALCIUM: 9.6 mg/dL (ref 8.9–10.3)
CO2: 25 mmol/L (ref 22–32)
Chloride: 87 mmol/L — ABNORMAL LOW (ref 101–111)
Creatinine, Ser: 2.11 mg/dL — ABNORMAL HIGH (ref 0.61–1.24)
GFR calc Af Amer: 35 mL/min — ABNORMAL LOW (ref >60)
GFR calc non Af Amer: 30 mL/min — ABNORMAL LOW (ref >60)
GLUCOSE: 212 mg/dL — AB (ref 65–99)
POTASSIUM: 3.1 mmol/L — AB (ref 3.5–5.1)
SODIUM: 130 mmol/L — AB (ref 135–145)

## 2016-12-09 LAB — CBC
HEMATOCRIT: 41.7 % (ref 40.0–52.0)
Hemoglobin: 13.5 g/dL (ref 13.0–18.0)
MCH: 24.9 pg — ABNORMAL LOW (ref 26.0–34.0)
MCHC: 32.3 g/dL (ref 32.0–36.0)
MCV: 77 fL — ABNORMAL LOW (ref 80.0–100.0)
PLATELETS: 340 10*3/uL (ref 150–440)
RBC: 5.41 MIL/uL (ref 4.40–5.90)
RDW: 18.1 % — AB (ref 11.5–14.5)
WBC: 8.2 10*3/uL (ref 3.8–10.6)

## 2016-12-09 LAB — BRAIN NATRIURETIC PEPTIDE: B NATRIURETIC PEPTIDE 5: 116 pg/mL — AB (ref 0.0–100.0)

## 2016-12-09 LAB — TROPONIN I
TROPONIN I: 0.08 ng/mL — AB (ref <0.03)
TROPONIN I: 0.08 ng/mL — AB (ref <0.03)
Troponin I: 0.07 ng/mL (ref <0.03)
Troponin I: 0.08 ng/mL (ref <0.03)

## 2016-12-09 LAB — GLUCOSE, CAPILLARY: Glucose-Capillary: 248 mg/dL — ABNORMAL HIGH (ref 65–99)

## 2016-12-09 LAB — APTT
APTT: 68 s — AB (ref 24–36)
aPTT: 33 seconds (ref 24–36)

## 2016-12-09 LAB — PROTIME-INR
INR: 1.54
PROTHROMBIN TIME: 18.6 s — AB (ref 11.4–15.2)

## 2016-12-09 LAB — HEPARIN LEVEL (UNFRACTIONATED): Heparin Unfractionated: 2.6 IU/mL — ABNORMAL HIGH (ref 0.30–0.70)

## 2016-12-09 MED ORDER — HEPARIN (PORCINE) IN NACL 100-0.45 UNIT/ML-% IJ SOLN
1550.0000 [IU]/h | INTRAMUSCULAR | Status: DC
  Administered 2016-12-09: 1450 [IU]/h via INTRAVENOUS
  Administered 2016-12-10: 1550 [IU]/h via INTRAVENOUS
  Filled 2016-12-09 (×3): qty 250

## 2016-12-09 MED ORDER — COLCHICINE 0.6 MG PO TABS
0.6000 mg | ORAL_TABLET | Freq: Every day | ORAL | Status: DC
  Administered 2016-12-10 – 2016-12-11 (×2): 0.6 mg via ORAL
  Filled 2016-12-09 (×2): qty 1

## 2016-12-09 MED ORDER — GI COCKTAIL ~~LOC~~
30.0000 mL | Freq: Once | ORAL | Status: AC
  Administered 2016-12-09: 30 mL via ORAL
  Filled 2016-12-09: qty 30

## 2016-12-09 MED ORDER — DOCUSATE SODIUM 100 MG PO CAPS: 100.0000 mg | ORAL_CAPSULE | Freq: Two times a day (BID) | ORAL | Status: DC | PRN

## 2016-12-09 MED ORDER — DOXEPIN HCL 25 MG PO CAPS
25.0000 mg | ORAL_CAPSULE | Freq: Every day | ORAL | Status: DC
  Administered 2016-12-09 – 2016-12-10 (×2): 25 mg via ORAL
  Filled 2016-12-09 (×3): qty 1

## 2016-12-09 MED ORDER — NITROGLYCERIN 0.4 MG SL SUBL: 0.4000 mg | SUBLINGUAL_TABLET | SUBLINGUAL | Status: DC | PRN

## 2016-12-09 MED ORDER — FEBUXOSTAT 40 MG PO TABS
40.0000 mg | ORAL_TABLET | Freq: Every day | ORAL | Status: DC
  Administered 2016-12-10 – 2016-12-11 (×2): 40 mg via ORAL
  Filled 2016-12-09 (×2): qty 1

## 2016-12-09 MED ORDER — METOPROLOL TARTRATE 50 MG PO TABS
50.0000 mg | ORAL_TABLET | Freq: Two times a day (BID) | ORAL | Status: DC
  Administered 2016-12-10 – 2016-12-11 (×2): 50 mg via ORAL
  Filled 2016-12-09 (×4): qty 1

## 2016-12-09 MED ORDER — FUROSEMIDE 40 MG PO TABS
40.0000 mg | ORAL_TABLET | Freq: Two times a day (BID) | ORAL | Status: DC
  Administered 2016-12-09 – 2016-12-11 (×4): 40 mg via ORAL
  Filled 2016-12-09 (×4): qty 1

## 2016-12-09 MED ORDER — PANTOPRAZOLE SODIUM 40 MG PO TBEC
40.0000 mg | DELAYED_RELEASE_TABLET | Freq: Every day | ORAL | Status: DC
  Administered 2016-12-10 – 2016-12-11 (×2): 40 mg via ORAL
  Filled 2016-12-09 (×2): qty 1

## 2016-12-09 MED ORDER — AMLODIPINE BESYLATE 5 MG PO TABS
10.0000 mg | ORAL_TABLET | Freq: Every day | ORAL | Status: DC
  Administered 2016-12-10 – 2016-12-11 (×2): 10 mg via ORAL
  Filled 2016-12-09 (×2): qty 2

## 2016-12-09 MED ORDER — BUPROPION HCL ER (SR) 100 MG PO TB12
100.0000 mg | ORAL_TABLET | Freq: Every day | ORAL | Status: DC
  Administered 2016-12-10 – 2016-12-11 (×2): 100 mg via ORAL
  Filled 2016-12-09 (×2): qty 1

## 2016-12-09 MED ORDER — FLUTICASONE PROPIONATE 50 MCG/ACT NA SUSP
1.0000 | Freq: Every day | NASAL | Status: DC
  Filled 2016-12-09 (×2): qty 16

## 2016-12-09 MED ORDER — POLYSACCHARIDE IRON COMPLEX 150 MG PO CAPS
150.0000 mg | ORAL_CAPSULE | Freq: Every day | ORAL | Status: DC
  Administered 2016-12-10 – 2016-12-11 (×2): 150 mg via ORAL
  Filled 2016-12-09 (×2): qty 1

## 2016-12-09 MED ORDER — SENNOSIDES-DOCUSATE SODIUM 8.6-50 MG PO TABS
2.0000 | ORAL_TABLET | Freq: Every day | ORAL | Status: DC
  Administered 2016-12-09 – 2016-12-11 (×3): 2 via ORAL
  Filled 2016-12-09 (×3): qty 2

## 2016-12-09 MED ORDER — SODIUM CHLORIDE 0.9% FLUSH
3.0000 mL | Freq: Two times a day (BID) | INTRAVENOUS | Status: DC
  Administered 2016-12-09 – 2016-12-11 (×4): 3 mL via INTRAVENOUS

## 2016-12-09 MED ORDER — FAMOTIDINE IN NACL 20-0.9 MG/50ML-% IV SOLN
20.0000 mg | Freq: Once | INTRAVENOUS | Status: AC
  Administered 2016-12-09: 20 mg via INTRAVENOUS
  Filled 2016-12-09: qty 50

## 2016-12-09 MED ORDER — LEVOTHYROXINE SODIUM 50 MCG PO TABS
100.0000 ug | ORAL_TABLET | Freq: Every day | ORAL | Status: DC
  Administered 2016-12-10 – 2016-12-11 (×2): 100 ug via ORAL
  Filled 2016-12-09 (×2): qty 2

## 2016-12-09 MED ORDER — ALBUTEROL SULFATE HFA 108 (90 BASE) MCG/ACT IN AERS: 2.0000 | INHALATION_SPRAY | RESPIRATORY_TRACT | Status: DC | PRN

## 2016-12-09 MED ORDER — MONTELUKAST SODIUM 10 MG PO TABS
10.0000 mg | ORAL_TABLET | Freq: Every day | ORAL | Status: DC
  Administered 2016-12-09 – 2016-12-10 (×2): 10 mg via ORAL
  Filled 2016-12-09 (×2): qty 1

## 2016-12-09 MED ORDER — ACETAMINOPHEN 325 MG PO TABS
650.0000 mg | ORAL_TABLET | ORAL | Status: DC | PRN
  Administered 2016-12-11: 650 mg via ORAL
  Filled 2016-12-09: qty 2

## 2016-12-09 MED ORDER — ZIPRASIDONE HCL 80 MG PO CAPS
80.0000 mg | ORAL_CAPSULE | Freq: Two times a day (BID) | ORAL | Status: DC
  Administered 2016-12-09 – 2016-12-11 (×4): 80 mg via ORAL
  Filled 2016-12-09 (×5): qty 1

## 2016-12-09 MED ORDER — SUVOREXANT 10 MG PO TABS: 10.0000 mg | ORAL_TABLET | Freq: Every day | ORAL | Status: DC

## 2016-12-09 MED ORDER — MOMETASONE FURO-FORMOTEROL FUM 100-5 MCG/ACT IN AERO
2.0000 | INHALATION_SPRAY | Freq: Two times a day (BID) | RESPIRATORY_TRACT | Status: DC
  Administered 2016-12-09 – 2016-12-11 (×3): 2 via RESPIRATORY_TRACT
  Filled 2016-12-09 (×2): qty 8.8

## 2016-12-09 MED ORDER — NITROGLYCERIN IN D5W 200-5 MCG/ML-% IV SOLN
0.0000 ug/min | Freq: Once | INTRAVENOUS | Status: DC
  Filled 2016-12-09: qty 250

## 2016-12-09 MED ORDER — POTASSIUM CHLORIDE CRYS ER 20 MEQ PO TBCR
40.0000 meq | EXTENDED_RELEASE_TABLET | Freq: Every day | ORAL | Status: DC
  Administered 2016-12-10: 40 meq via ORAL
  Filled 2016-12-09: qty 2

## 2016-12-09 MED ORDER — ZOLPIDEM TARTRATE 5 MG PO TABS
5.0000 mg | ORAL_TABLET | Freq: Every evening | ORAL | Status: DC | PRN
  Administered 2016-12-10: 5 mg via ORAL
  Filled 2016-12-09: qty 1

## 2016-12-09 MED ORDER — ASPIRIN EC 81 MG PO TBEC
81.0000 mg | DELAYED_RELEASE_TABLET | Freq: Every day | ORAL | Status: DC
  Administered 2016-12-10 – 2016-12-11 (×2): 81 mg via ORAL
  Filled 2016-12-09 (×2): qty 1

## 2016-12-09 MED ORDER — METOLAZONE 5 MG PO TABS
5.0000 mg | ORAL_TABLET | Freq: Every day | ORAL | Status: DC
  Administered 2016-12-10 – 2016-12-11 (×2): 5 mg via ORAL
  Filled 2016-12-09 (×2): qty 1

## 2016-12-09 MED ORDER — SODIUM CHLORIDE 0.9 % IV SOLN
INTRAVENOUS | Status: AC
  Administered 2016-12-09 – 2016-12-10 (×2): via INTRAVENOUS

## 2016-12-09 MED ORDER — ALBUTEROL SULFATE (2.5 MG/3ML) 0.083% IN NEBU: 2.5000 mg | INHALATION_SOLUTION | RESPIRATORY_TRACT | Status: DC | PRN

## 2016-12-09 MED ORDER — IPRATROPIUM-ALBUTEROL 0.5-2.5 (3) MG/3ML IN SOLN
3.0000 mL | RESPIRATORY_TRACT | Status: DC | PRN
Start: 2016-12-09 — End: 2016-12-11

## 2016-12-09 MED ORDER — MECLIZINE HCL 25 MG PO TABS
25.0000 mg | ORAL_TABLET | Freq: Two times a day (BID) | ORAL | Status: DC | PRN
  Filled 2016-12-09: qty 1

## 2016-12-09 NOTE — Progress Notes (Signed)
[place] for Heparin Indication: chest pain/ACS and h/o pulmonary embolus  Allergies  Allergen Reactions  . Dilaudid [Hydromorphone Hcl] Other (See Comments)    "CANNOT HEAR, CANNOT THINK"    . Hydrocodone-Acetaminophen Nausea And Vomiting  . Hydromorphone Nausea Only    Vomiting, diarrhea.  . Morphine And Related Other (See Comments)    Patient was told by a doctor that this "would kill" him  . Tetracycline Other (See Comments)    Patient was told by a doctor that this "would kill" him  . Tetracyclines & Related Other (See Comments)    Patient doesn't recall the reaction  . Penicillins Rash    Has patient had a PCN reaction causing immediate rash, facial/tongue/throat swelling, SOB or lightheadedness with hypotension: Yes Has patient had a PCN reaction causing severe rash involving mucus membranes or skin necrosis: No Has patient had a PCN reaction that required hospitalization No Has patient had a PCN reaction occurring within the last 10 years: No If all of the above answers are "NO", then may proceed with Cephalosporin use.   . Sulfa Antibiotics Rash    Patient Measurements: Height: 5\' 7"  (170.2 cm) Weight: (!) 346 lb (156.9 kg) IBW/kg (Calculated) : 66.1 Heparin Dosing Weight: 104.9 kg  Vital Signs: Temp: 97.5 F (36.4 C) (07/18 1142) Temp Source: Oral (07/18 1142) BP: 141/69 (07/18 1142) Pulse Rate: 80 (07/18 1142)  Labs:  Recent Labs  12/09/16 1148  HGB 13.5  HCT 41.7  PLT 340  APTT 33  LABPROT 18.6*  INR 1.54  CREATININE 2.11*  TROPONINI 0.07*    Estimated Creatinine Clearance: 46.5 mL/min (A) (by C-G formula based on SCr of 2.11 mg/dL (H)).   Medical History: Past Medical History:  Diagnosis Date  . Abscess 10/08/2016  . Abscess of right leg excluding foot   . Acute on chronic renal failure ([place]) 08/23/2016  . Acute on chronic respiratory failure ([place]) 08/23/2016  . AKI (acute kidney injury) ([place]) 08/27/2016   . Arthritis   . Asthma   . Bipolar 1 disorder ([place]) 07/23/2016   Last Assessment & Plan:  On multiple meds including geodon and seems to be stable.    [name] CAD (coronary artery disease) 04/27/2016  . Cellulitis 09/21/2016  . Chest pain, rule out acute myocardial infarction 02/16/2016  . CHF (congestive heart failure) ([place])   . Chronic bronchitis ([place]) 07/23/2016   Last Assessment & Plan:  Breathing at baseline now with some chronic sob.   . Current use of proton pump inhibitor 08/23/2016  . Demand ischemia ([place]) 08/27/2016  . Diabetes ([place])   . DVT (deep venous thrombosis) ([place]) 08/27/2016  . Electrolyte imbalance 08/23/2016  . Elevated troponin 08/27/2016  . Heart trouble   . History of asthma 08/23/2016  . History of bipolar disorder 08/23/2016  . History of chronic pain 08/23/2016  . History of gastric ulcer 08/23/2016  . History of stomach ulcers   . History of stroke 07/23/2016   Last Assessment & Plan:  On asa/plavix so won't increase asa for now re dvt prevention  . HTN (hypertension) 04/27/2016  . Hypertension   . Lactic acidosis 08/23/2016  . 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 Obesity, unspecified 11/22/2013  . OSA (obstructive sleep apnea) 08/23/2016  . Pulmonary embolism ([place]) 08/23/2016  . Reactive airway disease 11/22/2013  . S/P CABG x 2   . SIRS (systemic inflammatory response syndrome) ([place]) 08/23/2016  . Skin cancer   . Stroke [facility])  Assessment: 71 y/o M with a h/o CAD and PE on Xarelto 20 mg daily with last dose 7/17 to begin heparin infusion.   Goal of Therapy:  APTT 68-109 Heparin level 0.3-0.7 units/ml Monitor platelets by anticoagulation protocol: Yes   Plan:  Will check a baseline HL and begin infusion without bolus due to last dose of rivaroxaban 7/17. Start heparin infusion at 1450 units/hr Check aPTT level in 6 hours and daily while on heparin Continue to monitor H&H and platelets  Ulice Dash D 12/09/2016,2:23 PM

## 2016-12-09 NOTE — Progress Notes (Signed)
Pt arrived from ED alert and oriented. NO SOB, no c/o pain. Telemety verified with Ailene Ravel NT and skin verified with Counselling psychologist. VSS, no concerns offered from pt.

## 2016-12-09 NOTE — ED Provider Notes (Signed)
[facility] Emergency Department Provider Note  ____________________________________________  Time seen: Approximately 12:24 PM  I have reviewed the triage vital signs and the nursing notes.   HISTORY  Chief Complaint Chest Pain    HPI [name] is a 71 y.o. male with a history of CAD, CHF, DVT and PE on Xarelto, GERD, presenting for chest pain. The patient reports that approximately 4 hours he developed a sharp pain in the center of the chest similar to his previous GERD. EMS was called and he was given aspirin and nitroglycerin, which did not help his pain. He reports associated shortness of breath, but no radiation, pleuritic component, palpitations, lightheadedness or syncope. He continues to have discomfort at this time. He denies any recent illness including fever or chills, cough or cold symptoms.   Past Medical History:  Diagnosis Date  . Abscess 10/08/2016  . Abscess of right leg excluding foot   . Acute on chronic renal failure ([place]) 08/23/2016  . Acute on chronic respiratory failure ([place]) 08/23/2016  . AKI (acute kidney injury) ([place]) 08/27/2016  . Arthritis   . Asthma   . Bipolar 1 disorder ([place]) 07/23/2016   Last Assessment & Plan:  On multiple meds including geodon and seems to be stable.    [name] CAD (coronary artery disease) 04/27/2016  . Cellulitis 09/21/2016  . Chest pain, rule out acute myocardial infarction 02/16/2016  . CHF (congestive heart failure) ([place])   . Chronic bronchitis ([place]) 07/23/2016   Last Assessment & Plan:  Breathing at baseline now with some chronic sob.   . Current use of proton pump inhibitor 08/23/2016  . Demand ischemia ([place]) 08/27/2016  . Diabetes ([place])   . DVT (deep venous thrombosis) ([place]) 08/27/2016  . Electrolyte imbalance 08/23/2016  . Elevated troponin 08/27/2016  . Heart trouble   . History of asthma 08/23/2016  . History of bipolar disorder 08/23/2016  . History of chronic pain 08/23/2016  . History of gastric ulcer 08/23/2016   . History of stomach ulcers   . History of stroke 07/23/2016   Last Assessment & Plan:  On asa/plavix so won't increase asa for now re dvt prevention  . HTN (hypertension) 04/27/2016  . Hypertension   . Lactic acidosis 08/23/2016  . 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 Obesity, unspecified 11/22/2013  . OSA (obstructive sleep apnea) 08/23/2016  . Pulmonary embolism ([place]) 08/23/2016  . Reactive airway disease 11/22/2013  . S/P CABG x 2   . SIRS (systemic inflammatory response syndrome) ([place]) 08/23/2016  . Skin cancer   . Stroke [facility])     Patient Active Problem List   Diagnosis Date Noted  . Abscess 10/08/2016  . Abscess of right leg excluding foot   . Cellulitis 09/21/2016  . AKI (acute kidney injury) ([place]) 08/27/2016  . Demand ischemia ([place]) 08/27/2016  . Elevated troponin 08/27/2016  . 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 DVT (deep venous thrombosis) ([place]) 08/27/2016  . Pressure injury of skin 08/24/2016  . Pulmonary embolism ([place]) 08/23/2016  . SIRS (systemic inflammatory response syndrome) ([place]) 08/23/2016  . Acute on chronic renal failure ([place]) 08/23/2016  . Acute on chronic respiratory failure ([place]) 08/23/2016  . Lactic acidosis 08/23/2016  . Electrolyte imbalance 08/23/2016  . Current use of proton pump inhibitor 08/23/2016  . OSA (obstructive sleep apnea) 08/23/2016  . History of bipolar disorder 08/23/2016  . History of gastric ulcer 08/23/2016  . History of asthma 08/23/2016  . History of chronic pain 08/23/2016  . Acute pulmonary  embolism ([place]) 08/23/2016  . Age-related osteoporosis with current pathological fracture with routine healing 07/23/2016  . Bipolar 1 disorder ([place]) 07/23/2016  . Chronic bronchitis ([place]) 07/23/2016  . History of stroke 07/23/2016  . Chest pain 07/14/2016  . UTI (urinary tract infection) 04/27/2016  . CAD (coronary artery disease) 04/27/2016  . Diabetes ([place]) 04/27/2016  . HTN (hypertension) 04/27/2016  . Chronic diastolic CHF (congestive heart failure) ([place])  04/27/2016  . CKD (chronic kidney disease), stage III 04/27/2016  . Chest pain, rule out acute myocardial infarction 02/16/2016  . Obesity, unspecified 11/22/2013  . Reactive airway disease 11/22/2013    Past Surgical History:  Procedure Laterality Date  . CARDIAC SURGERY    . CHOLECYSTECTOMY    . GALLBLADDER SURGERY    . HEART BYPASS    . HERNIA REPAIR    . IR ANGIOGRAM PULMONARY BILATERAL SELECTIVE  08/23/2016  . IR ANGIOGRAM SELECTIVE EACH ADDITIONAL VESSEL  08/23/2016  . IR ANGIOGRAM SELECTIVE EACH ADDITIONAL VESSEL  08/23/2016  . IR INFUSION THROMBOL ARTERIAL INITIAL (MS)  08/23/2016  . IR INFUSION THROMBOL ARTERIAL INITIAL (MS)  08/23/2016  . IR THROMB F/U EVAL ART/VEN FINAL DAY (MS)  08/24/2016  . IR US GUIDE VASC ACCESS RIGHT  08/23/2016  . KIDNEY SURGERY      Current Outpatient Rx  . Order #: [id number] Class: Historical Med  . Order #: [id number] Class: Historical Med  . Order #: [id number] Class: Historical Med  . Order #: [id number] Class: Historical Med  . Order #: [id number] Class: Historical Med  . Order #: [id number] Class: Historical Med  . Order #: [id number] Class: Historical Med  . Order #: [id number] Class: Historical Med  . Order #: [id number] Class: Historical Med  . Order #: [id number] Class: Historical Med  . Order #: [id number] Class: Historical Med  . Order #: [id number] Class: Historical Med  . Order #: [id number] Class: Historical Med  . Order #: [id number] Class: Historical Med  . Order #: [id number] Class: Historical Med  . Order #: [id number] Class: Historical Med  . Order #: [id number] Class: Historical Med  . Order #: [id number] Class: Historical Med  . Order #: [id number] Class: Historical Med  . Order #: [id number] Class: Historical Med  . Order #: [id number] Class: Historical Med  . Order #: [id number] Class: Historical Med  . Order #: [id number] Class: Normal  . Order #: [id number] Class: Historical Med  . Order #: [id number] Class: Historical Med  . Order #: [id number] Class: Historical Med     Allergies Dilaudid [hydromorphone hcl]; Hydrocodone-acetaminophen; Hydromorphone; Morphine and related; Tetracycline; Tetracyclines & related; Penicillins; and Sulfa antibiotics  Family History  Problem Relation Age of Onset  . Diabetes Mother   . Heart attack Father   . Diabetes Brother   . Heart attack Brother     Social History Social History  Substance Use Topics  . Smoking status: Former Smoker    Quit date: 03/15/1963  . Smokeless tobacco: Never Used  . Alcohol use No    Review of Systems Constitutional: No fever/chills. No lightheadedness or syncope. Eyes: No visual changes. ENT: No sore throat. No congestion or rhinorrhea. Cardiovascular: Positive chest pain. Denies palpitations. Respiratory: Positive shortness of breath.  No cough. Gastrointestinal: No abdominal pain.  No nausea, no vomiting.  No diarrhea.  No constipation. Genitourinary: Negative for dysuria. Musculoskeletal: Negative for back pain. No lower 70 swelling or calf pain. Skin: Negative for rash. Neurological: Negative for headaches. No focal numbness, tingling or weakness.     ____________________________________________   PHYSICAL EXAM:  VITAL SIGNS: ED Triage Vitals  Enc Vitals Group  BP 12/09/16 1142 (!) 141/69     Pulse Rate 12/09/16 1142 80     Resp --      Temp 12/09/16 1142 (!) 97.5 F (36.4 C)     Temp Source 12/09/16 1142 Oral     SpO2 12/09/16 1140 95 %     Weight 12/09/16 1142 (!) 346 lb (156.9 kg)     Height 12/09/16 1142 5\' 7"  (1.702 m)     Head Circumference --      Peak Flow --      Pain Score 12/09/16 1140 8     Pain Loc --      Pain Edu? --      Excl. in [place]? --     Constitutional: Alert and oriented. Chronically ill but in no acute distress. Answers questions appropriately. Eyes: Conjunctivae are normal.  EOMI. No scleral icterus. Head: Atraumatic. Nose: No congestion/rhinnorhea. Mouth/Throat: Mucous membranes are mildly dry.  Neck: No stridor.  Supple.   No JVD. No meningismus. Cardiovascular: Normal rate, regular rhythm. No murmurs, rubs or gallops.  Respiratory: Normal respiratory effort.  No accessory muscle use or retractions. Lungs CTAB.  No wheezes, rales or ronchi. Gastrointestinal: Morbidly obese. Soft, nontender and nondistended.  No guarding or rebound.  No peritoneal signs. Musculoskeletal: Bilateral lateral symmetric nonpitting lower extremity edema.. No ttp in the calves or palpable cords.  Negative Homan's sign. Neurologic:  A&Ox3.  Speech is clear.  Face and smile are symmetric.  EOMI.  Moves all extremities well. Skin:  Skin is warm, dry and intact. No rash noted. Psychiatric: Mood and affect are normal. Speech and behavior are normal.  Normal judgement.  ____________________________________________   LABS (all labs ordered are listed, but only abnormal results are displayed)  Labs Reviewed  CBC - Abnormal; Notable for the following:       Result Value   MCV 77.0 (*)    MCH 24.9 (*)    RDW 18.1 (*)    All other components within normal limits  BASIC METABOLIC PANEL  TROPONIN I  TROPONIN I   ____________________________________________  EKG  ED ECG REPORT I, Eula Listen, the attending physician, personally viewed and interpreted this ECG.   Date: 12/09/2016  EKG Time: 1141  Rate: 82  Rhythm: normal sinus rhythm  Axis: leftward  Intervals:first-degree A-V block ; prolonged QTc  ST&T Change: No STEMI  ____________________________________________  RADIOLOGY  No results found.  ____________________________________________   PROCEDURES  Procedure(s) performed: None  Procedures  Critical Care performed: Yes ____________________________________________   INITIAL IMPRESSION / ASSESSMENT AND PLAN / ED COURSE  Pertinent labs & imaging results that were available during my care of the patient were reviewed by me and considered in my medical decision making (see chart for details).  71 y.o.  male with multiple chronic medical comorbidities presenting with chest pain. Overall, patient is mildly hypertensive but otherwise has reassuring EKG without ischemic changes. It is possible that the patient's pain is ACS or MI, and we will evaluate for this. The patient does say that this pain is similar to his previous reflux, so treated him for that as well, especially since he did not improve with aspirin and nitroglycerin. The patient is high risk for PE so a get a CT scan for further evaluation. Plan reevaluation for final disposition.  ----------------------------------------- 1:06 PM on 12/09/2016 -----------------------------------------  The patient does have an elevated troponin, and with active chest pain, is concerning for an STEMI. He has not a heparin candidate as  patient is already anticoagulated with Xarelto. I will start the patient on nitroglycerin drip, to see if I'm able to control his chest pain.  In addition, the patient has acute on chronic renal insufficiency and will be treated with intravenous fluids. His creatinine bump per includes him from having a CT of the chest, but at this time it is more likely that his chest pain is cardiac in nature rather than from pulmonary embolus.  Lastly, the patient does have some hyponatremia and hypokalemia, which appears to be baseline for this patient. I will supplement him.  Plan admission at this time.  CRITICAL CARE Performed by: Eula Listen   Total critical care time: 40 minutes  Critical care time was exclusive of separately billable procedures and treating other patients.  Critical care was necessary to treat or prevent imminent or life-threatening deterioration.  Critical care was time spent personally by me on the following activities: development of treatment plan with patient and/or surrogate as well as nursing, discussions with consultants, evaluation of patient's response to treatment, examination of  patient, obtaining history from patient or surrogate, ordering and performing treatments and interventions, ordering and review of laboratory studies, ordering and review of radiographic studies, pulse oximetry and re-evaluation of patient's condition.  ____________________________________________  FINAL CLINICAL IMPRESSION(S) / ED DIAGNOSES  Final diagnoses:  None         NEW MEDICATIONS STARTED DURING THIS VISIT:  New Prescriptions   No medications on file      Eula Listen, MD 12/09/16 1307

## 2016-12-09 NOTE — ED Triage Notes (Signed)
Patient arrived from [place] for chest pain that started around 0830. Staff reported giving 3 nitro with no relief. 324 aspirin given by EMS.

## 2016-12-09 NOTE — Progress Notes (Signed)
[place] for Heparin Indication: chest pain/ACS and h/o pulmonary embolus  Allergies  Allergen Reactions  . Dilaudid [Hydromorphone Hcl] Other (See Comments)    "CANNOT HEAR, CANNOT THINK"    . Hydrocodone-Acetaminophen Nausea And Vomiting  . Hydromorphone Nausea Only    Vomiting, diarrhea.  . Morphine And Related Other (See Comments)    Patient was told by a doctor that this "would kill" him  . Tetracycline Other (See Comments)    Patient was told by a doctor that this "would kill" him  . Tetracyclines & Related Other (See Comments)    Patient doesn't recall the reaction  . Penicillins Rash    Has patient had a PCN reaction causing immediate rash, facial/tongue/throat swelling, SOB or lightheadedness with hypotension: Yes Has patient had a PCN reaction causing severe rash involving mucus membranes or skin necrosis: No Has patient had a PCN reaction that required hospitalization No Has patient had a PCN reaction occurring within the last 10 years: No If all of the above answers are "NO", then may proceed with Cephalosporin use.   . Sulfa Antibiotics Rash    Patient Measurements: Height: 5\' 7"  (170.2 cm) Weight: (!) 322 lb 6.4 oz (146.2 kg) IBW/kg (Calculated) : 66.1 Heparin Dosing Weight: 104.9 kg  Vital Signs: Temp: 98 F (36.7 C) (07/18 2049) Temp Source: Oral (07/18 2049) BP: 98/62 (07/18 2049) Pulse Rate: 78 (07/18 2049)  Labs:  Recent Labs  12/09/16 1148 12/09/16 1449 12/09/16 1800 12/09/16 2050  HGB 13.5  --   --   --   HCT 41.7  --   --   --   PLT 340  --   --   --   APTT 33  --   --  68*  LABPROT 18.6*  --   --   --   INR 1.54  --   --   --   HEPARINUNFRC  --   --  2.60*  --   CREATININE 2.11*  --   --   --   TROPONINI 0.07* 0.08* 0.08* 0.08*    Estimated Creatinine Clearance: 44.6 mL/min (A) (by C-G formula based on SCr of 2.11 mg/dL (H)).   Medical History: Past Medical History:  Diagnosis Date  .  Abscess 10/08/2016  . Abscess of right leg excluding foot   . Acute on chronic renal failure ([place]) 08/23/2016  . Acute on chronic respiratory failure ([place]) 08/23/2016  . AKI (acute kidney injury) ([place]) 08/27/2016  . Arthritis   . Asthma   . Bipolar 1 disorder ([place]) 07/23/2016   Last Assessment & Plan:  On multiple meds including geodon and seems to be stable.    [name] CAD (coronary artery disease) 04/27/2016  . Cellulitis 09/21/2016  . Chest pain, rule out acute myocardial infarction 02/16/2016  . CHF (congestive heart failure) ([place])   . Chronic bronchitis ([place]) 07/23/2016   Last Assessment & Plan:  Breathing at baseline now with some chronic sob.   . Current use of proton pump inhibitor 08/23/2016  . Demand ischemia ([place]) 08/27/2016  . Diabetes ([place])   . DVT (deep venous thrombosis) ([place]) 08/27/2016  . Electrolyte imbalance 08/23/2016  . Elevated troponin 08/27/2016  . Heart trouble   . History of asthma 08/23/2016  . History of bipolar disorder 08/23/2016  . History of chronic pain 08/23/2016  . History of gastric ulcer 08/23/2016  . History of stomach ulcers   . History of stroke 07/23/2016   Last Assessment &  Plan:  On asa/plavix so won't increase asa for now re dvt prevention  . HTN (hypertension) 04/27/2016  . Hypertension   . Lactic acidosis 08/23/2016  . 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 Obesity, unspecified 11/22/2013  . OSA (obstructive sleep apnea) 08/23/2016  . Pulmonary embolism ([place]) 08/23/2016  . Reactive airway disease 11/22/2013  . S/P CABG x 2   . SIRS (systemic inflammatory response syndrome) ([place]) 08/23/2016  . Skin cancer   . Stroke [facility])     Assessment: 71 y/o M with a h/o CAD and PE on Xarelto 20 mg daily with last dose 7/17 to begin heparin infusion.   Goal of Therapy:  APTT 68-109 Heparin level 0.3-0.7 units/ml Monitor platelets by anticoagulation protocol: Yes   Plan:  APTT =68 therapeutic, but on the lower end. Will increase rate to 1550 and recheck with morning labs.  Babbette Dalesandro D  Rosco Harriott 12/09/2016,10:10 PM

## 2016-12-09 NOTE — H&P (Signed)
[facility] [place] NAME: [name]    MR#:  [id number]  DATE OF BIRTH:  1946/02/01  DATE OF ADMISSION:  12/09/2016  PRIMARY CARE PHYSICIAN: Jodi Marble, MD   REQUESTING/REFERRING PHYSICIAN: Mariea Clonts  CHIEF COMPLAINT:   Chief Complaint  Patient presents with  . Chest Pain    HISTORY OF PRESENT ILLNESS: [name]  is a 71 y.o. male with a known history of Acute kidney injury, arthritis, bipolar disorder, coronary artery disease, CHF, demand ischemia, DVT, pulmonary embolism, skin cancer, stroke- lives in a nursing home and had repeated visit in last few months for cardiac-related reasons either chest pain or heart failure. He stays his cardiologist Dr. Chancy Milroy had done a stress test and echocardiogram as outpatient 1 month ago and everything was okay as per him. He claims to be taking more his medications on time at nursing home as they give him. Today while he was resting in the morning he started having severe pain in his central chest which was radiating to his left arm and pressure-like pain. Concerned with this he spoke to the staff and they decided to send him to emergency room after giving aspirin and nitroglycerin tablets. His pain has gradually subsided after being in ER. He denies complaints of palpitation or shortness of breath.  PAST MEDICAL HISTORY:   Past Medical History:  Diagnosis Date  . Abscess 10/08/2016  . Abscess of right leg excluding foot   . Acute on chronic renal failure ([place]) 08/23/2016  . Acute on chronic respiratory failure ([place]) 08/23/2016  . AKI (acute kidney injury) ([place]) 08/27/2016  . Arthritis   . Asthma   . Bipolar 1 disorder ([place]) 07/23/2016   Last Assessment & Plan:  On multiple meds including geodon and seems to be stable.    [name] CAD (coronary artery disease) 04/27/2016  . Cellulitis 09/21/2016  . Chest pain, rule out acute myocardial infarction 02/16/2016  . CHF (congestive heart failure) ([place])   .  Chronic bronchitis ([place]) 07/23/2016   Last Assessment & Plan:  Breathing at baseline now with some chronic sob.   . Current use of proton pump inhibitor 08/23/2016  . Demand ischemia ([place]) 08/27/2016  . Diabetes ([place])   . DVT (deep venous thrombosis) ([place]) 08/27/2016  . Electrolyte imbalance 08/23/2016  . Elevated troponin 08/27/2016  . Heart trouble   . History of asthma 08/23/2016  . History of bipolar disorder 08/23/2016  . History of chronic pain 08/23/2016  . History of gastric ulcer 08/23/2016  . History of stomach ulcers   . History of stroke 07/23/2016   Last Assessment & Plan:  On asa/plavix so won't increase asa for now re dvt prevention  . HTN (hypertension) 04/27/2016  . Hypertension   . Lactic acidosis 08/23/2016  . 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 Obesity, unspecified 11/22/2013  . OSA (obstructive sleep apnea) 08/23/2016  . Pulmonary embolism ([place]) 08/23/2016  . Reactive airway disease 11/22/2013  . S/P CABG x 2   . SIRS (systemic inflammatory response syndrome) ([place]) 08/23/2016  . Skin cancer   . Stroke [facility])     PAST SURGICAL HISTORY: Past Surgical History:  Procedure Laterality Date  . CARDIAC SURGERY    . CHOLECYSTECTOMY    . GALLBLADDER SURGERY    . HEART BYPASS    . HERNIA REPAIR    . IR ANGIOGRAM PULMONARY BILATERAL SELECTIVE  08/23/2016  . IR ANGIOGRAM SELECTIVE EACH ADDITIONAL VESSEL  08/23/2016  .  IR ANGIOGRAM SELECTIVE EACH ADDITIONAL VESSEL  08/23/2016  . IR INFUSION THROMBOL ARTERIAL INITIAL (MS)  08/23/2016  . IR INFUSION THROMBOL ARTERIAL INITIAL (MS)  08/23/2016  . IR THROMB F/U EVAL ART/VEN FINAL DAY (MS)  08/24/2016  . IR US GUIDE VASC ACCESS RIGHT  08/23/2016  . KIDNEY SURGERY      SOCIAL HISTORY:  Social History  Substance Use Topics  . Smoking status: Former Smoker    Quit date: 03/15/1963  . Smokeless tobacco: Never Used  . Alcohol use No    FAMILY HISTORY:  Family History  Problem Relation Age of Onset  . Diabetes Mother   . Heart attack Father   . Diabetes Brother   .  Heart attack Brother     DRUG ALLERGIES:  Allergies  Allergen Reactions  . Dilaudid [Hydromorphone Hcl] Other (See Comments)    "CANNOT HEAR, CANNOT THINK"    . Hydrocodone-Acetaminophen Nausea And Vomiting  . Hydromorphone Nausea Only    Vomiting, diarrhea.  . Morphine And Related Other (See Comments)    Patient was told by a doctor that this "would kill" him  . Tetracycline Other (See Comments)    Patient was told by a doctor that this "would kill" him  . Tetracyclines & Related Other (See Comments)    Patient doesn't recall the reaction  . Penicillins Rash    Has patient had a PCN reaction causing immediate rash, facial/tongue/throat swelling, SOB or lightheadedness with hypotension: Yes Has patient had a PCN reaction causing severe rash involving mucus membranes or skin necrosis: No Has patient had a PCN reaction that required hospitalization No Has patient had a PCN reaction occurring within the last 10 years: No If all of the above answers are "NO", then may proceed with Cephalosporin use.   . Sulfa Antibiotics Rash    REVIEW OF SYSTEMS:   CONSTITUTIONAL: No fever, fatigue or weakness.  EYES: No blurred or double vision.  EARS, NOSE, AND THROAT: No tinnitus or ear pain.  RESPIRATORY: No cough, shortness of breath, wheezing or hemoptysis.  CARDIOVASCULAR: Positive for chest pain, no orthopnea, edema.  GASTROINTESTINAL: No nausea, vomiting, diarrhea or abdominal pain.  GENITOURINARY: No dysuria, hematuria.  ENDOCRINE: No polyuria, nocturia,  HEMATOLOGY: No anemia, easy bruising or bleeding SKIN: No rash or lesion. MUSCULOSKELETAL: No joint pain or arthritis.   NEUROLOGIC: No tingling, numbness, weakness.  PSYCHIATRY: No anxiety or depression.   MEDICATIONS AT HOME:  Prior to Admission medications   Medication Sig Start Date End Date Taking? Authorizing Provider  acetaminophen (TYLENOL) 325 MG tablet Take 650 mg by mouth every 4 (four) hours as needed (general  discomfort).   Yes [provider]  albuterol (PROAIR HFA) 108 (90 Base) MCG/ACT inhaler Inhale 2 puffs into the lungs every 4 (four) hours as needed for wheezing or shortness of breath.   Yes [provider]  amLODipine (NORVASC) 10 MG tablet Take 10 mg by mouth daily.  05/31/13  Yes [provider]  aspirin 81 MG EC tablet Chew 81 mg by mouth daily.   Yes [provider]  buPROPion (WELLBUTRIN SR) 100 MG 12 hr tablet Take 100 mg by mouth daily.  05/31/13  Yes [provider]  ciprofloxacin (CIPRO) 500 MG tablet Take 500 mg by mouth 2 (two) times daily. 11/22/16 12/12/16 Yes [provider]  colchicine 0.6 MG tablet Take 0.6 mg by mouth daily.   Yes [provider]  doxepin (SINEQUAN) 25 MG capsule Take 25 mg by  mouth at bedtime.    Yes [provider]  doxycycline (VIBRA-TABS) 100 MG tablet Take 100 mg by mouth 2 (two) times daily. 11/12/16 12/12/16 Yes [provider]  febuxostat (ULORIC) 40 MG tablet Take 40 mg by mouth daily.   Yes [provider]  fluticasone (FLONASE) 50 MCG/ACT nasal spray Place 1 spray into both nostrils daily.   Yes [provider]  Fluticasone-Salmeterol (ADVAIR) 100-50 MCG/DOSE AEPB Inhale 1 puff into the lungs 2 (two) times daily.   Yes [provider]  furosemide (LASIX) 40 MG tablet Take 40 mg by mouth 2 (two) times daily.   Yes [provider]  ipratropium-albuterol (DUONEB) 0.5-2.5 (3) MG/3ML SOLN Inhale 3 mLs into the lungs every 4 (four) hours as needed (shortness of breath/wheezing).  05/01/16  Yes [provider]  iron polysaccharides (NIFEREX) 150 MG capsule Take 150 mg by mouth daily.   Yes [provider]  levothyroxine (SYNTHROID, LEVOTHROID) 100 MCG tablet Take 100 mcg by mouth daily before breakfast.  05/31/13  Yes [provider]  meclizine (ANTIVERT) 25 MG tablet Take 25 mg by mouth 2 (two) times daily as needed for  dizziness.  05/31/13  Yes [provider]  metolazone (ZAROXOLYN) 5 MG tablet Take 5 mg by mouth daily.   Yes [provider]  metoprolol (LOPRESSOR) 50 MG tablet Take 50 mg by mouth 2 (two) times daily.  05/31/13  Yes [provider]  montelukast (SINGULAIR) 10 MG tablet Take 1 tablet by mouth at bedtime.    Yes [provider]  pantoprazole (PROTONIX) 40 MG tablet Take 40 mg by mouth daily.    Yes [provider]  potassium chloride SA (K-DUR,KLOR-CON) 20 MEQ tablet Take 40 mEq by mouth daily.    Yes [provider]  rivaroxaban (XARELTO) 20 MG TABS tablet Take 1 tablet (20 mg total) by mouth daily with supper. 09/23/16  Yes Mody, Sital, MD  senna-docusate (SENOKOT-S) 8.6-50 MG tablet Take 2 tablets by mouth daily.   Yes [provider]  Suvorexant (BELSOMRA) 10 MG TABS Take 10 mg by mouth at bedtime.   Yes [provider]  ziprasidone (GEODON) 80 MG capsule Take 80 mg by mouth 2 (two) times daily with a meal.   Yes [provider]      PHYSICAL EXAMINATION:   VITAL SIGNS: Blood pressure (!) 141/69, pulse 80, temperature (!) 97.5 F (36.4 C), temperature source Oral, height 5\' 7"  (1.702 m), weight (!) 156.9 kg (346 lb), SpO2 95 %.  GENERAL:  71 y.o.-year-old Obese patient lying in the bed with no acute distress.  EYES: Pupils equal, round, reactive to light and accommodation. No scleral icterus. Extraocular muscles intact.  HEENT: Head atraumatic, normocephalic. Oropharynx and nasopharynx clear.  NECK:  Supple, no jugular venous distention. No thyroid enlargement, no tenderness.  LUNGS: Normal breath sounds bilaterally, no wheezing, rales,rhonchi or crepitation. No use of accessory muscles of respiration.  CARDIOVASCULAR: S1, S2 normal. No murmurs, rubs, or gallops.  ABDOMEN: Soft, nontender, nondistended. Bowel sounds present. No organomegaly or mass.  EXTREMITIES: No pedal edema, cyanosis, or clubbing.   NEUROLOGIC: Cranial nerves II through XII are intact. Muscle strength 4/5 in all extremities. Sensation intact. Gait not checked.  PSYCHIATRIC: The patient is alert and oriented x 3.  SKIN: No obvious rash, lesion, or ulcer.   LABORATORY PANEL:   CBC  Recent Labs Lab 12/09/16 1148  WBC 8.2  HGB 13.5  HCT 41.7  PLT 340  MCV 77.0*  MCH 24.9*  MCHC 32.3  RDW 18.1*   ------------------------------------------------------------------------------------------------------------------  Chemistries   Recent Labs Lab 12/09/16 1148  NA 130*  K 3.1*  CL 87*  CO2 25  GLUCOSE 212*  BUN 56*  CREATININE 2.11*  CALCIUM 9.6   ------------------------------------------------------------------------------------------------------------------ estimated creatinine clearance is 46.5 mL/min (A) (by C-G formula based on SCr of 2.11 mg/dL (H)). ------------------------------------------------------------------------------------------------------------------ No results for input(s): TSH, T4TOTAL, T3FREE, THYROIDAB in the last 72 hours.  Invalid input(s): FREET3   Coagulation profile  Recent Labs Lab 12/09/16 1148  INR 1.54   ------------------------------------------------------------------------------------------------------------------- No results for input(s): DDIMER in the last 72 hours. -------------------------------------------------------------------------------------------------------------------  Cardiac Enzymes  Recent Labs Lab 12/09/16 1148  TROPONINI 0.07*   ------------------------------------------------------------------------------------------------------------------ Invalid input(s): POCBNP  ---------------------------------------------------------------------------------------------------------------  Urinalysis    Component Value Date/Time   COLORURINE YELLOW (A) 10/08/2016 1652   APPEARANCEUR CLEAR (A) 10/08/2016 1652   APPEARANCEUR Hazy 08/20/2014  1116   LABSPEC 1.010 10/08/2016 1652   LABSPEC 1.016 08/20/2014 1116   PHURINE 6.0 10/08/2016 1652   GLUCOSEU NEGATIVE 10/08/2016 1652   GLUCOSEU Negative 08/20/2014 1116   HGBUR NEGATIVE 10/08/2016 1652   BILIRUBINUR NEGATIVE 10/08/2016 1652   BILIRUBINUR Negative 08/20/2014 1116   KETONESUR NEGATIVE 10/08/2016 1652   PROTEINUR NEGATIVE 10/08/2016 1652   NITRITE NEGATIVE 10/08/2016 1652   LEUKOCYTESUR NEGATIVE 10/08/2016 1652   LEUKOCYTESUR 3+ 08/20/2014 1116     RADIOLOGY: Dg Chest 2 View  Result Date: 12/09/2016 CLINICAL DATA:  Chest pain beginning at 8:30 this morning. Shortness of breath. EXAM: CHEST  2 VIEW COMPARISON:  CT chest 08/23/2016.  PA and lateral chest 10/08/2016. FINDINGS: The lungs are clear. Elevation of the right hemidiaphragm is unchanged. Heart size is normal. No pneumothorax or pleural effusion. The patient is status post CABG. Fractured upper median sternotomy wires are unchanged. IMPRESSION: No acute disease. Electronically Signed   By: Inge Rise M.D.   On: 12/09/2016 12:27    EKG: Orders placed or performed during the hospital encounter of 12/09/16  . ED EKG within 10 minutes  . ED EKG within 10 minutes    IMPRESSION AND PLAN:  * Anginal chest pain    I spoke to his cardiologist Dr. Chancy Milroy- as per him on stress test patient had some abnormalities on inferior wall but because of his high creatinine he decided not to do any interventions at that time.   He suggested to monitor with the telemetry and serial troponins and hold his toe, in case if he needs to have catheterization in this admission.   As echocardiogram and stress test recently done I would not order any further test on him but let cardiology handle the case.  * History of DVT and PE   Hold oral anticoagulation at this time and keep on heparin IV drip, in case if he needs to have cardiac catheterization.  * History of CAD   Continue aspirin, beta blocker, statin.  * Acute worsening of  CK stage III   We'll give gentle IV hydration and monitor.  * Hypokalemia   We'll not replace at this time because of renal failure.  * Hypertension   Continue home medications.  * COPD   No wheezing currently continue DuoNeb.  All the records are reviewed and case discussed with ED provider. Management plans discussed with the patient, family and they are in agreement.  CODE STATUS: Full code. Code Status History    Date Active Date Inactive Code Status Order ID Comments User  Context   11/21/2016  9:36 AM 11/22/2016  4:50 PM Full Code [id number]  Baxter Hire, MD Inpatient   10/08/2016 10:11 PM 10/12/2016  7:31 PM Full Code [id number]  Vaughan Basta, MD Inpatient   09/22/2016  2:04 AM 09/23/2016  2:47 PM Full Code [id number]  Saundra Shelling, MD ED   08/23/2016 10:40 AM 08/27/2016 11:21 PM Full Code [id number]  Brand Males, MD Inpatient   07/14/2016  8:10 AM 07/14/2016  7:07 PM Full Code [id number]  Saundra Shelling, MD ED   04/27/2016  2:28 AM 04/28/2016  5:18 PM Full Code [id number]  Lance Coon, MD Inpatient   02/16/2016  8:34 PM 02/17/2016  3:40 PM Full Code [id number]  [place], Ubaldo Glassing, DO ED    Advance Directive Documentation     Most Recent Value  Type of Advance Directive  Living will  Pre-existing out of facility DNR order (yellow form or pink MOST form)  -  "MOST" Form in Place?  -       TOTAL TIME TAKING CARE OF THIS PATIENT: 50 minutes.    Vaughan Basta M.D on 12/09/2016   Between 7am to 6pm - Pager - [phone]  After 6pm go to [email or web address] - password [email or web address] [facility]  Office  [phone]  CC: Primary care physician; Jodi Marble, MD   Note: This dictation was prepared with Dragon dictation along with smaller phrase technology. Any transcriptional errors that result from this process are unintentional.

## 2016-12-10 DIAGNOSIS — I248 Other forms of acute ischemic heart disease: Secondary | ICD-10-CM | POA: Diagnosis not present

## 2016-12-10 LAB — BASIC METABOLIC PANEL
Anion gap: 13 (ref 5–15)
BUN: 61 mg/dL — AB (ref 6–20)
CALCIUM: 9.7 mg/dL (ref 8.9–10.3)
CO2: 31 mmol/L (ref 22–32)
CREATININE: 2.05 mg/dL — AB (ref 0.61–1.24)
Chloride: 88 mmol/L — ABNORMAL LOW (ref 101–111)
GFR, EST AFRICAN AMERICAN: 36 mL/min — AB (ref >60)
GFR, EST NON AFRICAN AMERICAN: 31 mL/min — AB (ref >60)
Glucose, Bld: 204 mg/dL — ABNORMAL HIGH (ref 65–99)
Potassium: 2.8 mmol/L — ABNORMAL LOW (ref 3.5–5.1)
SODIUM: 132 mmol/L — AB (ref 135–145)

## 2016-12-10 LAB — BLOOD GAS, ARTERIAL
ACID-BASE EXCESS: 5.9 mmol/L — AB (ref 0.0–2.0)
Allens test (pass/fail): POSITIVE — AB
Bicarbonate: 29.7 mmol/L — ABNORMAL HIGH (ref 20.0–28.0)
FIO2: 21
O2 SAT: 94.3 %
PATIENT TEMPERATURE: 37
PCO2 ART: 39 mmHg (ref 32.0–48.0)
pH, Arterial: 7.49 — ABNORMAL HIGH (ref 7.350–7.450)
pO2, Arterial: 66 mmHg — ABNORMAL LOW (ref 83.0–108.0)

## 2016-12-10 LAB — CBC
HCT: 41 % (ref 40.0–52.0)
Hemoglobin: 13.1 g/dL (ref 13.0–18.0)
MCH: 24.9 pg — ABNORMAL LOW (ref 26.0–34.0)
MCHC: 32 g/dL (ref 32.0–36.0)
MCV: 77.8 fL — AB (ref 80.0–100.0)
PLATELETS: 328 10*3/uL (ref 150–440)
RBC: 5.26 MIL/uL (ref 4.40–5.90)
RDW: 18.2 % — AB (ref 11.5–14.5)
WBC: 9.8 10*3/uL (ref 3.8–10.6)

## 2016-12-10 LAB — HEMOGLOBIN A1C
HEMOGLOBIN A1C: 7.8 % — AB (ref 4.8–5.6)
MEAN PLASMA GLUCOSE: 177 mg/dL

## 2016-12-10 LAB — APTT: APTT: 80 s — AB (ref 24–36)

## 2016-12-10 LAB — HEPARIN LEVEL (UNFRACTIONATED): HEPARIN UNFRACTIONATED: 1.49 [IU]/mL — AB (ref 0.30–0.70)

## 2016-12-10 LAB — MAGNESIUM: Magnesium: 2.3 mg/dL (ref 1.7–2.4)

## 2016-12-10 LAB — POTASSIUM: POTASSIUM: 2.8 mmol/L — AB (ref 3.5–5.1)

## 2016-12-10 LAB — GLUCOSE, CAPILLARY
GLUCOSE-CAPILLARY: 203 mg/dL — AB (ref 65–99)
Glucose-Capillary: 234 mg/dL — ABNORMAL HIGH (ref 65–99)

## 2016-12-10 MED ORDER — RIVAROXABAN 20 MG PO TABS
20.0000 mg | ORAL_TABLET | Freq: Every day | ORAL | Status: DC
  Administered 2016-12-10: 20 mg via ORAL
  Filled 2016-12-10: qty 1

## 2016-12-10 MED ORDER — INSULIN ASPART 100 UNIT/ML ~~LOC~~ SOLN
0.0000 [IU] | Freq: Every day | SUBCUTANEOUS | Status: DC
  Administered 2016-12-10: 2 [IU] via SUBCUTANEOUS
  Filled 2016-12-10: qty 1

## 2016-12-10 MED ORDER — POTASSIUM CHLORIDE CRYS ER 20 MEQ PO TBCR
40.0000 meq | EXTENDED_RELEASE_TABLET | Freq: Two times a day (BID) | ORAL | Status: DC
  Administered 2016-12-10 – 2016-12-11 (×2): 40 meq via ORAL
  Filled 2016-12-10 (×2): qty 2

## 2016-12-10 MED ORDER — INSULIN ASPART 100 UNIT/ML ~~LOC~~ SOLN
0.0000 [IU] | Freq: Three times a day (TID) | SUBCUTANEOUS | Status: DC
  Administered 2016-12-10: 3 [IU] via SUBCUTANEOUS
  Administered 2016-12-11: 2 [IU] via SUBCUTANEOUS
  Administered 2016-12-11: 3 [IU] via SUBCUTANEOUS
  Filled 2016-12-10 (×3): qty 1

## 2016-12-10 MED ORDER — POTASSIUM CHLORIDE CRYS ER 20 MEQ PO TBCR
20.0000 meq | EXTENDED_RELEASE_TABLET | Freq: Once | ORAL | Status: AC
  Administered 2016-12-10: 20 meq via ORAL
  Filled 2016-12-10: qty 1

## 2016-12-10 MED ORDER — POTASSIUM CHLORIDE CRYS ER 20 MEQ PO TBCR
40.0000 meq | EXTENDED_RELEASE_TABLET | Freq: Once | ORAL | Status: AC
  Administered 2016-12-10: 40 meq via ORAL
  Filled 2016-12-10: qty 2

## 2016-12-10 NOTE — Progress Notes (Signed)
Inpatient Diabetes Program Recommendations  AACE/ADA: New Consensus Statement on Inpatient Glycemic Control (2015)  Target Ranges:  Prepandial:   less than 140 mg/dL      Peak postprandial:   less than 180 mg/dL (1-2 hours)      Critically ill patients:  140 - 180 mg/dL  Results for PRESLEY, SUMMERLIN (MRN [id number]) as of 12/10/2016 09:27  Ref. Range 12/09/2016 11:48 12/09/2016 18:00 12/10/2016 04:03  Glucose Latest Ref Range: 65 - 99 mg/dL 212 (H)  204 (H)  Hemoglobin A1C Latest Ref Range: 4.8 - 5.6 %  7.8 (H)    Results for ALISON, BREEDING (MRN [id number]) as of 12/10/2016 09:27  Ref. Range 12/09/2016 21:25  Glucose-Capillary Latest Ref Range: 65 - 99 mg/dL 248 (H)   Review of Glycemic Control  Diabetes history: DM2 Outpatient Diabetes medications: None Current orders for Inpatient glycemic control: None  Inpatient Diabetes Program Recommendations: Correction (SSI): While inpatient, please consider ordering CBGs with Novolog correction scale ACHS. HgbA1C: A1C 7.8% on 12/09/16 indicating an average glucose of 177 mg/dl over the past 2-3 months. Recommend patient follow up with PCP regarding DM control.  Thanks, Barnie Alderman, RN, MSN, CDE Diabetes Coordinator Inpatient Diabetes Program [phone] (Team Pager from 8am to 5pm)

## 2016-12-10 NOTE — Care Management Obs Status (Signed)
[facility] NOTIFICATION   Patient Details  Name: [name] MRN: [id number] Date of Birth: September 05, 1945   Medicare Observation Status Notification Given:  Yes Provided and explained notice.  No questions.  Unable to remove a signed copy from patient's room to place on the chart due to patient being on contact isolation.   Katrina Stack, RN 12/10/2016, 8:40 AM

## 2016-12-10 NOTE — Progress Notes (Signed)
[facility] [place] NAME: [name]    MR#:  [id number]  DATE OF BIRTH:  [birth year]  SUBJECTIVE:   Patient here due to atypical chest pain. Says his chest pain is mostly reproducible. Noted to have a mildly elevated troponin which has not tried upwards. Noted to be severely hypokalemic.  No other complaints or events overnight.   REVIEW OF SYSTEMS:    Review of Systems  Constitutional: Negative for chills and fever.  HENT: Negative for congestion and tinnitus.   Eyes: Negative for blurred vision and double vision.  Respiratory: Negative for cough, shortness of breath and wheezing.   Cardiovascular: Positive for chest pain. Negative for orthopnea and PND.  Gastrointestinal: Negative for abdominal pain, diarrhea, nausea and vomiting.  Genitourinary: Negative for dysuria and hematuria.  Neurological: Negative for dizziness, sensory change and focal weakness.  All other systems reviewed and are negative.   Nutrition: Heart healthy Tolerating Diet: Yes Tolerating PT: Bedbound.      DRUG ALLERGIES:   Allergies  Allergen Reactions  . Dilaudid [Hydromorphone Hcl] Other (See Comments)    "CANNOT HEAR, CANNOT THINK"    . Hydrocodone-Acetaminophen Nausea And Vomiting  . Hydromorphone Nausea Only    Vomiting, diarrhea.  . Morphine And Related Other (See Comments)    Patient was told by a doctor that this "would kill" him  . Tetracycline Other (See Comments)    Patient was told by a doctor that this "would kill" him  . Tetracyclines & Related Other (See Comments)    Patient doesn't recall the reaction  . Penicillins Rash    Has patient had a PCN reaction causing immediate rash, facial/tongue/throat swelling, SOB or lightheadedness with hypotension: Yes Has patient had a PCN reaction causing severe rash involving mucus membranes or skin necrosis: No Has patient had a PCN reaction that required hospitalization No Has patient had a  PCN reaction occurring within the last 10 years: No If all of the above answers are "NO", then may proceed with Cephalosporin use.   . Sulfa Antibiotics Rash    VITALS:  Blood pressure (!) 97/40, pulse 72, temperature 98.3 F (36.8 C), temperature source Oral, resp. rate 20, height 5\' 7"  (1.702 m), weight (!) 146.2 kg (322 lb 6.4 oz), SpO2 97 %.  PHYSICAL EXAMINATION:   Physical Exam  GENERAL:  71 y.o.-year-old morbidly obese patient lying in bed in no acute distress.  EYES: Pupils equal, round, reactive to light and accommodation. No scleral icterus. Extraocular muscles intact.  HEENT: Head atraumatic, normocephalic. Oropharynx and nasopharynx clear.  NECK:  Supple, no jugular venous distention. No thyroid enlargement, no tenderness.  LUNGS: Normal breath sounds bilaterally, no wheezing, rales, rhonchi. No use of accessory muscles of respiration.  CARDIOVASCULAR: S1, S2 normal. No murmurs, rubs, or gallops.  ABDOMEN: Soft, nontender, nondistended. Bowel sounds present. No organomegaly or mass.  EXTREMITIES: No cyanosis, clubbing or edema b/l.    NEUROLOGIC: Cranial nerves II through XII are intact. No focal Motor or sensory deficits b/l. Globally weak  PSYCHIATRIC: The patient is alert and oriented x 3. SKIN: No obvious rash, lesion, or ulcer.    LABORATORY PANEL:   CBC  Recent Labs Lab 12/10/16 0403  WBC 9.8  HGB 13.1  HCT 41.0  PLT 328   ------------------------------------------------------------------------------------------------------------------  Chemistries   Recent Labs Lab 12/10/16 0403 12/10/16 1445  NA 132*  --   K 2.8* 2.8*  CL 88*  --   CO2 31  --  GLUCOSE 204*  --   BUN 61*  --   CREATININE 2.05*  --   CALCIUM 9.7  --   MG  --  2.3   ------------------------------------------------------------------------------------------------------------------  Cardiac Enzymes  Recent Labs Lab 12/09/16 2050  TROPONINI 0.08*    ------------------------------------------------------------------------------------------------------------------  RADIOLOGY:  Dg Chest 2 View  Result Date: 12/09/2016 CLINICAL DATA:  Chest pain beginning at 8:30 this morning. Shortness of breath. EXAM: CHEST  2 VIEW COMPARISON:  CT chest 08/23/2016.  PA and lateral chest 10/08/2016. FINDINGS: The lungs are clear. Elevation of the right hemidiaphragm is unchanged. Heart size is normal. No pneumothorax or pleural effusion. The patient is status post CABG. Fractured upper median sternotomy wires are unchanged. IMPRESSION: No acute disease. Electronically Signed   By: Inge Rise M.D.   On: 12/09/2016 12:27     ASSESSMENT AND PLAN:   71 year old past medical history of chronic chest pain, diabetes, history of DVT, bipolar disorder, CHF, hypertension, hyperlipidemia, history of coronary artery disease status post bypass, obstructive sleep apnea, obesity who presented to the hospital due to chest pain.  1. Chest pain-patient's chest pain is quite atypical as it's reproducible likely musculoskeletal in nature. His troponins have not really tended upwards. -He had a recent nuclear medicine stress test done at his cardiologist's office which was negative. Discussed with Dr. Humphrey Rolls and no plans for acute intervention presently. DC heparin drip -Continue aspirin, metoprolol - follow up with Cards as outpatient next week.   2. Hypokalemia - will cont. To replace and repeat in a.m.  - mg. Level normal.   3. CHF-chronic systolic CHF. Clinically patient is not in congestive heart failure.  -continue Lasix, metolazone, metoprolol.  4. Hypothyroidism-continue Synthroid.  5. COPD-no acute exacerbation. Continue duo nebs as needed.  6. GERD-continue Protonix.  7. History of previous DVT-continue Xarelto.  8. History of bipolar disorder-continue Geodon.  9. Diabetes type 2 without complication-continue sliding scale insulin.  10. History of  gout-no acute attack. Continue Uloric.    D/c tomorrow to SNF if potassium improved.   All the records are reviewed and case discussed with Care Management/Social Worker. Management plans discussed with the patient, family and they are in agreement.  CODE STATUS: Full code  DVT Prophylaxis: Xarelto  TOTAL TIME TAKING CARE OF THIS PATIENT: 30 minutes.   POSSIBLE D/C IN 1-2 DAYS, DEPENDING ON CLINICAL CONDITION.   Henreitta Leber M.D on 12/10/2016 at 3:32 PM  Between 7am to 6pm - Pager - [phone]  After 6pm go to [email or web address] - Proofreader  [facility]  Office  [phone]  CC: Primary care physician; Jodi Marble, MD

## 2016-12-10 NOTE — NC FL2 (Signed)
[place] LEVEL OF CARE SCREENING TOOL     IDENTIFICATION  Patient Name: [name] Birthdate: 12/10/1945 Sex: male Admission Date (Current Location): 12/09/2016  [place] and [place] Number:  Selena Lesser [id number] [place] and Address:  [facility], [address], [place], [place] [address]      Provider Number: [id number]  Attending Physician Name and Address:  Henreitta Leber, MD  Relative Name and Phone Number:  Aundria Mems [phone] or [facility] Daughter [phone] or [place], [place] Daughter [phone]     Current Level of Care: Hospital Recommended Level of Care: [place] Prior Approval Number:    Date Approved/Denied:   PASRR Number: [id number]  Discharge Plan: SNF    Current Diagnoses: Patient Active Problem List   Diagnosis Date Noted  . Abscess 10/08/2016  . Abscess of right leg excluding foot   . Cellulitis 09/21/2016  . AKI (acute kidney injury) ([place]) 08/27/2016  . Demand ischemia ([place]) 08/27/2016  . Elevated troponin 08/27/2016  . 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 DVT (deep venous thrombosis) ([place]) 08/27/2016  . Pressure injury of skin 08/24/2016  . Pulmonary embolism ([place]) 08/23/2016  . SIRS (systemic inflammatory response syndrome) ([place]) 08/23/2016  . Acute on chronic renal failure ([place]) 08/23/2016  . Acute on chronic respiratory failure ([place]) 08/23/2016  . Lactic acidosis 08/23/2016  . Electrolyte imbalance 08/23/2016  . Current use of proton pump inhibitor 08/23/2016  . OSA (obstructive sleep apnea) 08/23/2016  . History of bipolar disorder 08/23/2016  . History of gastric ulcer 08/23/2016  . History of asthma 08/23/2016  . History of chronic pain 08/23/2016  . Acute pulmonary embolism ([place]) 08/23/2016  . Age-related osteoporosis with current pathological fracture with routine healing 07/23/2016  . Bipolar 1 disorder ([place]) 07/23/2016  . Chronic bronchitis ([place])  07/23/2016  . History of stroke 07/23/2016  . Chest pain 07/14/2016  . UTI (urinary tract infection) 04/27/2016  . CAD (coronary artery disease) 04/27/2016  . Diabetes ([place]) 04/27/2016  . HTN (hypertension) 04/27/2016  . Chronic diastolic CHF (congestive heart failure) ([place]) 04/27/2016  . CKD (chronic kidney disease), stage III 04/27/2016  . Chest pain, rule out acute myocardial infarction 02/16/2016  . Obesity, unspecified 11/22/2013  . Reactive airway disease 11/22/2013    [place] RESPIRATION BLADDER Height & Weight     Self, Situation, Place, Time  Normal Incontinent Weight: (!) 322 lb 6.4 oz (146.2 kg) Height:  5\' 7"  (170.2 cm)  BEHAVIORAL SYMPTOMS/MOOD NEUROLOGICAL BOWEL NUTRITION STATUS      Incontinent Diet (2 gram sodium diet)  AMBULATORY STATUS COMMUNICATION OF NEEDS Skin   Limited Assist Verbally Surgical wounds                       Personal Care Assistance Level of Assistance  Bathing, Dressing Bathing Assistance: Limited assistance   Dressing Assistance: Limited assistance     Functional Limitations Info             SPECIAL CARE FACTORS FREQUENCY  PT (By licensed PT)     PT Frequency: 5X a week              Contractures Contractures Info: Not present    Additional Factors Info  Code Status, Allergies, Isolation Precautions, Psychotropic Code Status Info: Full  Allergies Info: DILAUDID HYDROMORPHONE HCL, HYDROCODONE-ACETAMINOPHEN, HYDROMORPHONE, MORPHINE AND RELATED, TETRACYCLINE, TETRACYCLINES & RELATED, PENICILLINS, SULFA ANTIBIOTICS Psychotropic Info: buPROPion (WELLBUTRIN SR) 12 hr tablet 100 mg and ziprasidone (GEODON) capsule 80 mg  Isolation Precautions Info: Contact precautions for MRSA     Current Medications (12/10/2016):  This is the current hospital active medication list Current Facility-Administered Medications  Medication Dose Route Frequency Provider Last Rate Last Dose  . 0.9 %  sodium chloride infusion   Intravenous  Continuous Vaughan Basta, MD 50 mL/hr at 12/09/16 1608    . acetaminophen (TYLENOL) tablet 650 mg  650 mg Oral Q4H PRN Vaughan Basta, MD      . albuterol (PROVENTIL) (2.5 MG/3ML) 0.083% nebulizer solution 2.5 mg  2.5 mg Nebulization Q4H PRN Vaughan Basta, MD      . amLODipine (NORVASC) tablet 10 mg  10 mg Oral Daily Vaughan Basta, MD   10 mg at 12/10/16 0923  . aspirin EC tablet 81 mg  81 mg Oral Daily Vaughan Basta, MD   81 mg at 12/10/16 0923  . buPROPion (WELLBUTRIN SR) 12 hr tablet 100 mg  100 mg Oral Daily Vaughan Basta, MD   100 mg at 12/10/16 0925  . colchicine tablet 0.6 mg  0.6 mg Oral Daily Vaughan Basta, MD   0.6 mg at 12/10/16 0865  . docusate sodium (COLACE) capsule 100 mg  100 mg Oral BID PRN Vaughan Basta, MD      . doxepin (SINEQUAN) capsule 25 mg  25 mg Oral QHS Vaughan Basta, MD   25 mg at 12/09/16 2141  . febuxostat (ULORIC) tablet 40 mg  40 mg Oral Daily Vaughan Basta, MD   40 mg at 12/10/16 0925  . fluticasone (FLONASE) 50 MCG/ACT nasal spray 1 spray  1 spray Each Nare Daily Vaughan Basta, MD      . furosemide (LASIX) tablet 40 mg  40 mg Oral BID Vaughan Basta, MD   40 mg at 12/10/16 0923  . heparin ADULT infusion 100 units/mL (25000 units/234mL sodium chloride 0.45%)  1,550 Units/hr Intravenous Continuous Marcelle Overlie D, RPH 15.5 mL/hr at 12/10/16 0500 1,550 Units/hr at 12/10/16 0500  . ipratropium-albuterol (DUONEB) 0.5-2.5 (3) MG/3ML nebulizer solution 3 mL  3 mL Inhalation Q4H PRN Vaughan Basta, MD      . iron polysaccharides (NIFEREX) capsule 150 mg  150 mg Oral Daily Vaughan Basta, MD   150 mg at 12/10/16 0925  . levothyroxine (SYNTHROID, LEVOTHROID) tablet 100 mcg  100 mcg Oral QAC breakfast Vaughan Basta, MD   100 mcg at 12/10/16 0926  . meclizine (ANTIVERT) tablet 25 mg  25 mg Oral BID PRN Vaughan Basta, MD      . metolazone  (ZAROXOLYN) tablet 5 mg  5 mg Oral Daily Vaughan Basta, MD   5 mg at 12/10/16 0925  . metoprolol tartrate (LOPRESSOR) tablet 50 mg  50 mg Oral BID Vaughan Basta, MD   50 mg at 12/10/16 0923  . mometasone-formoterol (DULERA) 100-5 MCG/ACT inhaler 2 puff  2 puff Inhalation BID Vaughan Basta, MD   2 puff at 12/10/16 0924  . montelukast (SINGULAIR) tablet 10 mg  10 mg Oral QHS Vaughan Basta, MD   10 mg at 12/09/16 2141  . nitroGLYCERIN (NITROSTAT) SL tablet 0.4 mg  0.4 mg Sublingual Q5 min PRN Vaughan Basta, MD      . nitroGLYCERIN 50 mg in dextrose 5 % 250 mL (0.2 mg/mL) infusion  0-200 mcg/min Intravenous Once Eula Listen, MD   Stopped at 12/09/16 1535  . pantoprazole (PROTONIX) EC tablet 40 mg  40 mg Oral Daily Vaughan Basta, MD   40 mg at 12/10/16 0923  . potassium chloride SA (K-DUR,KLOR-CON) CR tablet 40 mEq  40 mEq Oral Daily Vaughan Basta, MD   40 mEq at 12/10/16 0923  . senna-docusate (Senokot-S) tablet 2 tablet  2 tablet Oral Daily Vaughan Basta, MD   2 tablet at 12/10/16 [phone]  . sodium chloride flush (NS) 0.9 % injection 3 mL  3 mL Intravenous Q12H Vaughan Basta, MD   3 mL at 12/10/16 0927  . ziprasidone (GEODON) capsule 80 mg  80 mg Oral BID WC Vaughan Basta, MD   80 mg at 12/10/16 0924  . zolpidem (AMBIEN) tablet 5 mg  5 mg Oral QHS PRN Vaughan Basta, MD   5 mg at 12/10/16 1021     Discharge Medications: Please see discharge summary for a list of discharge medications.  Relevant Imaging Results:  Relevant Lab Results:   Additional Information SSN [id number]  Ross Ludwig, [place]

## 2016-12-10 NOTE — Progress Notes (Signed)
[place] for Heparin Indication: chest pain/ACS and h/o pulmonary embolus  Allergies  Allergen Reactions  . Dilaudid [Hydromorphone Hcl] Other (See Comments)    "CANNOT HEAR, CANNOT THINK"    . Hydrocodone-Acetaminophen Nausea And Vomiting  . Hydromorphone Nausea Only    Vomiting, diarrhea.  . Morphine And Related Other (See Comments)    Patient was told by a doctor that this "would kill" him  . Tetracycline Other (See Comments)    Patient was told by a doctor that this "would kill" him  . Tetracyclines & Related Other (See Comments)    Patient doesn't recall the reaction  . Penicillins Rash    Has patient had a PCN reaction causing immediate rash, facial/tongue/throat swelling, SOB or lightheadedness with hypotension: Yes Has patient had a PCN reaction causing severe rash involving mucus membranes or skin necrosis: No Has patient had a PCN reaction that required hospitalization No Has patient had a PCN reaction occurring within the last 10 years: No If all of the above answers are "NO", then may proceed with Cephalosporin use.   . Sulfa Antibiotics Rash    Patient Measurements: Height: 5\' 7"  (170.2 cm) Weight: (!) 322 lb 6.4 oz (146.2 kg) IBW/kg (Calculated) : 66.1 Heparin Dosing Weight: 104.9 kg  Vital Signs: Temp: 98.3 F (36.8 C) (07/19 0443) Temp Source: Oral (07/19 0443) BP: 98/55 (07/19 0443) Pulse Rate: 75 (07/19 0443)  Labs:  Recent Labs  12/09/16 1148 12/09/16 1449 12/09/16 1800 12/09/16 2050 12/10/16 0403  HGB 13.5  --   --   --  13.1  HCT 41.7  --   --   --  41.0  PLT 340  --   --   --  328  APTT 33  --   --  68* 80*  LABPROT 18.6*  --   --   --   --   INR 1.54  --   --   --   --   HEPARINUNFRC  --   --  2.60*  --  1.49*  CREATININE 2.11*  --   --   --  2.05*  TROPONINI 0.07* 0.08* 0.08* 0.08*  --     Estimated Creatinine Clearance: 45.9 mL/min (A) (by C-G formula based on SCr of 2.05 mg/dL  (H)).   Medical History: Past Medical History:  Diagnosis Date  . Abscess 10/08/2016  . Abscess of right leg excluding foot   . Acute on chronic renal failure ([place]) 08/23/2016  . Acute on chronic respiratory failure ([place]) 08/23/2016  . AKI (acute kidney injury) ([place]) 08/27/2016  . Arthritis   . Asthma   . Bipolar 1 disorder ([place]) 07/23/2016   Last Assessment & Plan:  On multiple meds including geodon and seems to be stable.    [name] CAD (coronary artery disease) 04/27/2016  . Cellulitis 09/21/2016  . Chest pain, rule out acute myocardial infarction 02/16/2016  . CHF (congestive heart failure) ([place])   . Chronic bronchitis ([place]) 07/23/2016   Last Assessment & Plan:  Breathing at baseline now with some chronic sob.   . Current use of proton pump inhibitor 08/23/2016  . Demand ischemia ([place]) 08/27/2016  . Diabetes ([place])   . DVT (deep venous thrombosis) ([place]) 08/27/2016  . Electrolyte imbalance 08/23/2016  . Elevated troponin 08/27/2016  . Heart trouble   . History of asthma 08/23/2016  . History of bipolar disorder 08/23/2016  . History of chronic pain 08/23/2016  . History of gastric ulcer 08/23/2016  .  History of stomach ulcers   . History of stroke 07/23/2016   Last Assessment & Plan:  On asa/plavix so won't increase asa for now re dvt prevention  . HTN (hypertension) 04/27/2016  . Hypertension   . Lactic acidosis 08/23/2016  . 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 Obesity, unspecified 11/22/2013  . OSA (obstructive sleep apnea) 08/23/2016  . Pulmonary embolism ([place]) 08/23/2016  . Reactive airway disease 11/22/2013  . S/P CABG x 2   . SIRS (systemic inflammatory response syndrome) ([place]) 08/23/2016  . Skin cancer   . Stroke [facility])     Assessment: 71 y/o M with a h/o CAD and PE on Xarelto 20 mg daily with last dose 7/17 to begin heparin infusion.   Goal of Therapy:  APTT 68-109 Heparin level 0.3-0.7 units/ml Monitor platelets by anticoagulation protocol: Yes   Plan:  APTT =68 therapeutic, but on the lower end. Will increase  rate to 1550 and recheck with morning labs.  7/19 @ 0400 aPTT 80, HL 1.49. APTT therapeutic will continue current rate and will recheck aPTT/HL w/ am labs. Will continue to dose off of aPTT until both aPTT/HL correlate.  Tobie Lords, PharmD, BCPS Clinical Pharmacist 12/10/2016

## 2016-12-10 NOTE — Consult Note (Signed)
[name] is a 71 y.o. male  [id number]  Primary Cardiologist: Dr. Neoma Laming Reason for Consultation: Chest pain, elevated troponin.  HPI: [name] is a known patient to [place] with a history multiple chronic diseases including of CAD, CHF, diabetes and hypertension. He presented with chest pain and mildly elevated troponin to the ED and was admitted for observation.   Review of Systems: Pt reports he is feeling well with mild chest pain along his sternotomy site which is tender to touch.    Past Medical History:  Diagnosis Date  . Abscess 10/08/2016  . Abscess of right leg excluding foot   . Acute on chronic renal failure ([place]) 08/23/2016  . Acute on chronic respiratory failure ([place]) 08/23/2016  . AKI (acute kidney injury) ([place]) 08/27/2016  . Arthritis   . Asthma   . Bipolar 1 disorder ([place]) 07/23/2016   Last Assessment & Plan:  On multiple meds including geodon and seems to be stable.    [name] CAD (coronary artery disease) 04/27/2016  . Cellulitis 09/21/2016  . Chest pain, rule out acute myocardial infarction 02/16/2016  . CHF (congestive heart failure) ([place])   . Chronic bronchitis ([place]) 07/23/2016   Last Assessment & Plan:  Breathing at baseline now with some chronic sob.   . Current use of proton pump inhibitor 08/23/2016  . Demand ischemia ([place]) 08/27/2016  . Diabetes ([place])   . DVT (deep venous thrombosis) ([place]) 08/27/2016  . Electrolyte imbalance 08/23/2016  . Elevated troponin 08/27/2016  . Heart trouble   . History of asthma 08/23/2016  . History of bipolar disorder 08/23/2016  . History of chronic pain 08/23/2016  . History of gastric ulcer 08/23/2016  . History of stomach ulcers   . History of stroke 07/23/2016   Last Assessment & Plan:  On asa/plavix so won't increase asa for now re dvt prevention  . HTN (hypertension) 04/27/2016  . Hypertension   . Lactic acidosis 08/23/2016  . 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 Obesity, unspecified 11/22/2013  . OSA (obstructive sleep apnea) 08/23/2016  .  Pulmonary embolism ([place]) 08/23/2016  . Reactive airway disease 11/22/2013  . S/P CABG x 2   . SIRS (systemic inflammatory response syndrome) ([place]) 08/23/2016  . Skin cancer   . Stroke [facility])     Medications Prior to Admission  Medication Sig Dispense Refill  . acetaminophen (TYLENOL) 325 MG tablet Take 650 mg by mouth every 4 (four) hours as needed (general discomfort).    [name] albuterol (PROAIR HFA) 108 (90 Base) MCG/ACT inhaler Inhale 2 puffs into the lungs every 4 (four) hours as needed for wheezing or shortness of breath.    [name] amLODipine (NORVASC) 10 MG tablet Take 10 mg by mouth daily.     [name] aspirin 81 MG EC tablet Chew 81 mg by mouth daily.    [name] buPROPion (WELLBUTRIN SR) 100 MG 12 hr tablet Take 100 mg by mouth daily.     . ciprofloxacin (CIPRO) 500 MG tablet Take 500 mg by mouth 2 (two) times daily.    . colchicine 0.6 MG tablet Take 0.6 mg by mouth daily.    [name] doxepin (SINEQUAN) 25 MG capsule Take 25 mg by mouth at bedtime.     [name] doxycycline (VIBRA-TABS) 100 MG tablet Take 100 mg by mouth 2 (two) times daily.    . febuxostat (ULORIC) 40 MG tablet Take 40 mg by mouth daily.    . fluticasone (FLONASE) 50 MCG/ACT nasal spray Place 1 spray  into both nostrils daily.    . Fluticasone-Salmeterol (ADVAIR) 100-50 MCG/DOSE AEPB Inhale 1 puff into the lungs 2 (two) times daily.    . furosemide (LASIX) 40 MG tablet Take 40 mg by mouth 2 (two) times daily.    [name] ipratropium-albuterol (DUONEB) 0.5-2.5 (3) MG/3ML SOLN Inhale 3 mLs into the lungs every 4 (four) hours as needed (shortness of breath/wheezing).     . iron polysaccharides (NIFEREX) 150 MG capsule Take 150 mg by mouth daily.    [name] levothyroxine (SYNTHROID, LEVOTHROID) 100 MCG tablet Take 100 mcg by mouth daily before breakfast.     . meclizine (ANTIVERT) 25 MG tablet Take 25 mg by mouth 2 (two) times daily as needed for dizziness.     . metolazone (ZAROXOLYN) 5 MG tablet Take 5 mg by mouth daily.    . metoprolol (LOPRESSOR) 50 MG tablet Take 50  mg by mouth 2 (two) times daily.     . montelukast (SINGULAIR) 10 MG tablet Take 1 tablet by mouth at bedtime.     . pantoprazole (PROTONIX) 40 MG tablet Take 40 mg by mouth daily.     . potassium chloride SA (K-DUR,KLOR-CON) 20 MEQ tablet Take 40 mEq by mouth daily.     . rivaroxaban (XARELTO) 20 MG TABS tablet Take 1 tablet (20 mg total) by mouth daily with supper. 30 tablet 0  . senna-docusate (SENOKOT-S) 8.6-50 MG tablet Take 2 tablets by mouth daily.    . Suvorexant (BELSOMRA) 10 MG TABS Take 10 mg by mouth at bedtime.    . ziprasidone (GEODON) 80 MG capsule Take 80 mg by mouth 2 (two) times daily with a meal.       . amLODipine  10 mg Oral Daily  . aspirin EC  81 mg Oral Daily  . buPROPion  100 mg Oral Daily  . colchicine  0.6 mg Oral Daily  . doxepin  25 mg Oral QHS  . febuxostat  40 mg Oral Daily  . fluticasone  1 spray Each Nare Daily  . furosemide  40 mg Oral BID  . iron polysaccharides  150 mg Oral Daily  . levothyroxine  100 mcg Oral QAC breakfast  . metolazone  5 mg Oral Daily  . metoprolol tartrate  50 mg Oral BID  . mometasone-formoterol  2 puff Inhalation BID  . montelukast  10 mg Oral QHS  . pantoprazole  40 mg Oral Daily  . potassium chloride  20 mEq Oral Once  . potassium chloride SA  40 mEq Oral Daily  . senna-docusate  2 tablet Oral Daily  . sodium chloride flush  3 mL Intravenous Q12H  . ziprasidone  80 mg Oral BID WC    Infusions: . sodium chloride 50 mL/hr at 12/09/16 1608  . heparin 1,550 Units/hr (12/10/16 0500)  . nitroGLYCERIN Stopped (12/09/16 1535)    Allergies  Allergen Reactions  . Dilaudid [Hydromorphone Hcl] Other (See Comments)    "CANNOT HEAR, CANNOT THINK"    . Hydrocodone-Acetaminophen Nausea And Vomiting  . Hydromorphone Nausea Only    Vomiting, diarrhea.  . Morphine And Related Other (See Comments)    Patient was told by a doctor that this "would kill" him  . Tetracycline Other (See Comments)    Patient was told by a doctor  that this "would kill" him  . Tetracyclines & Related Other (See Comments)    Patient doesn't recall the reaction  . Penicillins Rash    Has patient had a PCN reaction causing immediate  rash, facial/tongue/throat swelling, SOB or lightheadedness with hypotension: Yes Has patient had a PCN reaction causing severe rash involving mucus membranes or skin necrosis: No Has patient had a PCN reaction that required hospitalization No Has patient had a PCN reaction occurring within the last 10 years: No If all of the above answers are "NO", then may proceed with Cephalosporin use.   . Sulfa Antibiotics Rash    Social History   Social History  . Marital status: Divorced    Spouse name: N/A  . Number of children: N/A  . Years of education: N/A   Occupational History  . disabled    Social History Main Topics  . Smoking status: Former Smoker    Quit date: 03/15/1963  . Smokeless tobacco: Never Used  . Alcohol use No  . Drug use: No  . Sexual activity: No   Other Topics Concern  . Not on file   Social History Narrative  . No narrative on file    Family History  Problem Relation Age of Onset  . Diabetes Mother   . Heart attack Father   . Diabetes Brother   . Heart attack Brother     PHYSICAL EXAM: Vitals:   12/09/16 2049 12/10/16 0443  BP: 98/62 (!) 98/55  Pulse: 78 75  Resp: 20 16  Temp: 98 F (36.7 C) 98.3 F (36.8 C)     Intake/Output Summary (Last 24 hours) at 12/10/16 0903 Last data filed at 12/10/16 0500  Gross per 24 hour  Intake           753.07 ml  Output                0 ml  Net           753.07 ml    General:  Well appearing. No respiratory difficulty HEENT: normal Neck: supple. no JVD. Carotids 2+ bilat; no bruits. No lymphadenopathy or thryomegaly appreciated. Cor: PMI nondisplaced. Regular rate & rhythm. No rubs, gallops or murmurs. Lungs: clear. Chest wall tenderness to palpation along old sternotomy site.  Abdomen: soft, nontender,  nondistended. No hepatosplenomegaly. No bruits or masses. Good bowel sounds. Extremities: no cyanosis, clubbing, rash, edema Neuro: alert & oriented x 3, cranial nerves grossly intact. moves all 4 extremities w/o difficulty. Affect pleasant.  ECG: NSR 68 bpm  Results for orders placed or performed during the hospital encounter of 12/09/16 (from the past 24 hour(s))  Basic metabolic panel     Status: Abnormal   Collection Time: 12/09/16 11:48 AM  Result Value Ref Range   Sodium 130 (L) 135 - 145 mmol/L   Potassium 3.1 (L) 3.5 - 5.1 mmol/L   Chloride 87 (L) 101 - 111 mmol/L   CO2 25 22 - 32 mmol/L   Glucose, Bld 212 (H) 65 - 99 mg/dL   BUN 56 (H) 6 - 20 mg/dL   Creatinine, Ser 2.11 (H) 0.61 - 1.24 mg/dL   Calcium 9.6 8.9 - 10.3 mg/dL   GFR calc non Af Amer 30 (L) >60 mL/min   GFR calc Af Amer 35 (L) >60 mL/min   Anion gap 18 (H) 5 - 15  CBC     Status: Abnormal   Collection Time: 12/09/16 11:48 AM  Result Value Ref Range   WBC 8.2 3.8 - 10.6 K/uL   RBC 5.41 4.40 - 5.90 MIL/uL   Hemoglobin 13.5 13.0 - 18.0 g/dL   HCT 41.7 40.0 - 52.0 %   MCV 77.0 (L) 80.0 -  100.0 fL   MCH 24.9 (L) 26.0 - 34.0 pg   MCHC 32.3 32.0 - 36.0 g/dL   RDW 18.1 (H) 11.5 - 14.5 %   Platelets 340 150 - 440 K/uL  Troponin I     Status: Abnormal   Collection Time: 12/09/16 11:48 AM  Result Value Ref Range   Troponin I 0.07 (HH) <0.03 ng/mL  Brain natriuretic peptide     Status: Abnormal   Collection Time: 12/09/16 11:48 AM  Result Value Ref Range   B Natriuretic Peptide 116.0 (H) 0.0 - 100.0 pg/mL  Protime-INR     Status: Abnormal   Collection Time: 12/09/16 11:48 AM  Result Value Ref Range   Prothrombin Time 18.6 (H) 11.4 - 15.2 seconds   INR 1.54   APTT     Status: None   Collection Time: 12/09/16 11:48 AM  Result Value Ref Range   aPTT 33 24 - 36 seconds  Lipid panel     Status: Abnormal   Collection Time: 12/09/16 11:48 AM  Result Value Ref Range   Cholesterol 163 0 - 200 mg/dL    Triglycerides 273 (H) <150 mg/dL   HDL 30 (L) >40 mg/dL   Total CHOL/HDL Ratio 5.4 RATIO   VLDL 55 (H) 0 - 40 mg/dL   LDL Cholesterol 78 0 - 99 mg/dL  Troponin I     Status: Abnormal   Collection Time: 12/09/16  2:49 PM  Result Value Ref Range   Troponin I 0.08 (HH) <0.03 ng/mL  Troponin I     Status: Abnormal   Collection Time: 12/09/16  6:00 PM  Result Value Ref Range   Troponin I 0.08 (HH) <0.03 ng/mL  Heparin level (unfractionated)     Status: Abnormal   Collection Time: 12/09/16  6:00 PM  Result Value Ref Range   Heparin Unfractionated 2.60 (H) 0.30 - 0.70 IU/mL  Hemoglobin A1c     Status: Abnormal   Collection Time: 12/09/16  6:00 PM  Result Value Ref Range   Hgb A1c MFr Bld 7.8 (H) 4.8 - 5.6 %   Mean Plasma Glucose 177 mg/dL  Troponin I     Status: Abnormal   Collection Time: 12/09/16  8:50 PM  Result Value Ref Range   Troponin I 0.08 (HH) <0.03 ng/mL  APTT     Status: Abnormal   Collection Time: 12/09/16  8:50 PM  Result Value Ref Range   aPTT 68 (H) 24 - 36 seconds  Glucose, capillary     Status: Abnormal   Collection Time: 12/09/16  9:25 PM  Result Value Ref Range   Glucose-Capillary 248 (H) 65 - 99 mg/dL   Comment 1 Notify RN    Comment 2 Document in Chart   Basic metabolic panel     Status: Abnormal   Collection Time: 12/10/16  4:03 AM  Result Value Ref Range   Sodium 132 (L) 135 - 145 mmol/L   Potassium 2.8 (L) 3.5 - 5.1 mmol/L   Chloride 88 (L) 101 - 111 mmol/L   CO2 31 22 - 32 mmol/L   Glucose, Bld 204 (H) 65 - 99 mg/dL   BUN 61 (H) 6 - 20 mg/dL   Creatinine, Ser 2.05 (H) 0.61 - 1.24 mg/dL   Calcium 9.7 8.9 - 10.3 mg/dL   GFR calc non Af Amer 31 (L) >60 mL/min   GFR calc Af Amer 36 (L) >60 mL/min   Anion gap 13 5 - 15  CBC  Status: Abnormal   Collection Time: 12/10/16  4:03 AM  Result Value Ref Range   WBC 9.8 3.8 - 10.6 K/uL   RBC 5.26 4.40 - 5.90 MIL/uL   Hemoglobin 13.1 13.0 - 18.0 g/dL   HCT 41.0 40.0 - 52.0 %   MCV 77.8 (L) 80.0 - 100.0  fL   MCH 24.9 (L) 26.0 - 34.0 pg   MCHC 32.0 32.0 - 36.0 g/dL   RDW 18.2 (H) 11.5 - 14.5 %   Platelets 328 150 - 440 K/uL  Heparin level (unfractionated)     Status: Abnormal   Collection Time: 12/10/16  4:03 AM  Result Value Ref Range   Heparin Unfractionated 1.49 (H) 0.30 - 0.70 IU/mL  APTT     Status: Abnormal   Collection Time: 12/10/16  4:03 AM  Result Value Ref Range   aPTT 80 (H) 24 - 36 seconds   Dg Chest 2 View  Result Date: 12/09/2016 CLINICAL DATA:  Chest pain beginning at 8:30 this morning. Shortness of breath. EXAM: CHEST  2 VIEW COMPARISON:  CT chest 08/23/2016.  PA and lateral chest 10/08/2016. FINDINGS: The lungs are clear. Elevation of the right hemidiaphragm is unchanged. Heart size is normal. No pneumothorax or pleural effusion. The patient is status post CABG. Fractured upper median sternotomy wires are unchanged. IMPRESSION: No acute disease. Electronically Signed   By: Inge Rise M.D.   On: 12/09/2016 12:27     ASSESSMENT AND PLAN: Mild troponin elevation likely due to demand ischemia. Pt has chest wall tenderness along old sternotomy site. He has a history of patient graphs on CCTA in 02/2016 and no significant ischemia on stress test in 06/2016. Advise discharge with outpatient repeat CCTA.  Jake Bathe

## 2016-12-10 NOTE — Care Management (Signed)
Patient presents from  [facility].  CSW aware.  Observation

## 2016-12-11 DIAGNOSIS — I248 Other forms of acute ischemic heart disease: Secondary | ICD-10-CM | POA: Diagnosis not present

## 2016-12-11 LAB — BASIC METABOLIC PANEL
ANION GAP: 12 (ref 5–15)
BUN: 53 mg/dL — ABNORMAL HIGH (ref 6–20)
CHLORIDE: 88 mmol/L — AB (ref 101–111)
CO2: 31 mmol/L (ref 22–32)
Calcium: 9.6 mg/dL (ref 8.9–10.3)
Creatinine, Ser: 1.53 mg/dL — ABNORMAL HIGH (ref 0.61–1.24)
GFR calc non Af Amer: 44 mL/min — ABNORMAL LOW (ref >60)
GFR, EST AFRICAN AMERICAN: 51 mL/min — AB (ref >60)
Glucose, Bld: 168 mg/dL — ABNORMAL HIGH (ref 65–99)
POTASSIUM: 3.3 mmol/L — AB (ref 3.5–5.1)
Sodium: 131 mmol/L — ABNORMAL LOW (ref 135–145)

## 2016-12-11 LAB — GLUCOSE, CAPILLARY
GLUCOSE-CAPILLARY: 217 mg/dL — AB (ref 65–99)
Glucose-Capillary: 168 mg/dL — ABNORMAL HIGH (ref 65–99)

## 2016-12-11 NOTE — Progress Notes (Signed)
SUBJECTIVE: Patient has chest pain all the time over the center of his chest which is reproducible by pressing on it.   Vitals:   12/10/16 0443 12/10/16 1146 12/10/16 2027 12/11/16 0453  BP: (!) 98/55 (!) 97/40 (!) 93/52 (!) 100/46  Pulse: 75 72 81 86  Resp: 16 20 18 18   Temp: 98.3 F (36.8 C)  (!) 97.5 F (36.4 C) 98.4 F (36.9 C)  TempSrc: Oral  Oral Oral  SpO2: 96% 97% 93% 95%  Weight:      Height:       No intake or output data in the 24 hours ending 12/11/16 0839  LABS: Basic Metabolic Panel:  Recent Labs  12/10/16 0403 12/10/16 1445 12/11/16 0601  NA 132*  --  131*  K 2.8* 2.8* 3.3*  CL 88*  --  88*  CO2 31  --  31  GLUCOSE 204*  --  168*  BUN 61*  --  53*  CREATININE 2.05*  --  1.53*  CALCIUM 9.7  --  9.6  MG  --  2.3  --    Liver Function Tests: No results for input(s): AST, ALT, ALKPHOS, BILITOT, PROT, ALBUMIN in the last 72 hours. No results for input(s): LIPASE, AMYLASE in the last 72 hours. CBC:  Recent Labs  12/09/16 1148 12/10/16 0403  WBC 8.2 9.8  HGB 13.5 13.1  HCT 41.7 41.0  MCV 77.0* 77.8*  PLT 340 328   Cardiac Enzymes:  Recent Labs  12/09/16 1449 12/09/16 1800 12/09/16 2050  TROPONINI 0.08* 0.08* 0.08*   BNP: Invalid input(s): POCBNP D-Dimer: No results for input(s): DDIMER in the last 72 hours. Hemoglobin A1C:  Recent Labs  12/09/16 1800  HGBA1C 7.8*   Fasting Lipid Panel:  Recent Labs  12/09/16 1148  CHOL 163  HDL 30*  LDLCALC 78  TRIG 273*  CHOLHDL 5.4   Thyroid Function Tests: No results for input(s): TSH, T4TOTAL, T3FREE, THYROIDAB in the last 72 hours.  Invalid input(s): FREET3 Anemia Panel: No results for input(s): VITAMINB12, FOLATE, FERRITIN, TIBC, IRON, RETICCTPCT in the last 72 hours.   PHYSICAL EXAM General: Well developed, well nourished, in no acute distress HEENT:  Normocephalic and atramatic Neck:  No JVD.  Lungs: Clear bilaterally to auscultation and percussion. Heart: HRRR . Normal S1  and S2 without gallops or murmurs.  Abdomen: Bowel sounds are positive, abdomen soft and non-tender  Msk:  Back normal, normal gait. Normal strength and tone for age. Extremities: No clubbing, cyanosis or edema.   Neuro: Alert and oriented X 3. Psych:  Good affect, responds appropriately  TELEMETRY: Sinus rhythm  ASSESSMENT AND PLAN: Atypical chest pain most likely musculoskeletal history of nonobstructive CAD and renal insufficiency and is not recommended to do cardiac catheterization with mildly elevated troponin due to demand ischemia. Patient can be discharged with follow-up in the office on Monday at 9:30 AM.  Principal Problem:   Demand ischemia [facility]) Active Problems:   Chest pain    Neoma Laming A, MD, [facility] 12/11/2016 8:39 AM

## 2016-12-11 NOTE — Clinical Social Work Note (Signed)
Patient to be d/c'ed today to [facility].  Patient and family agreeable to plans will transport via ems RN to call report [phone].  Leily Capek, MSW, LCSWA [phone]  

## 2016-12-11 NOTE — Clinical Social Work Note (Signed)
Clinical Social Work Assessment  Patient Details  Name: [name] MRN: [id number] Date of Birth: 12-Aug-1945  Date of referral:  12/11/16               Reason for consult:  Facility Placement                Permission sought to share information with:  Facility Sport and exercise psychologist, Family Supports Permission granted to share information::  Yes, Verbal Permission Granted  Name::     Aundria Mems [phone] or Croker,Jodi Daughter [phone] or Mario, [place] Daughter [phone]   Agency::  SNF admissions  Relationship::     Contact Information:     Housing/Transportation Living arrangements for the past 2 months:  [place] of Information:  Patient Patient Interpreter Needed:  None Criminal Activity/Legal Involvement Pertinent to Current Situation/Hospitalization:  No - Comment as needed Significant Relationships:  Adult Children, Other Family Members Lives with:  Facility Resident Do you feel safe going back to the place where you live?  Yes Need for family participation in patient care:  No (Coment)  Care giving concerns:  Patient states he is not happy at [facility], but he is accepting of returning back to SNF.   Social Worker assessment / plan:  Patient is a 71 year old male who is alert and oriented x 4.  Patient has been living at [facility] for awhile based on what he said.  Patient was just recently discharged from hospital, and went back to SNF.  Patient states he would like to go to [facility] eventually, CSW explained to patient he can discuss that with the Education officer, museum at [facility] and they can see if they can facilitate him transferring to a different facility.  Patient was not very talkative, but expressed that he is ready to discharge from the hospital.  Patient did not express any other concerns or issues.  CSW sent required clinical information to SNF for patient to return to SNF.  Employment status:   Retired Nurse, adult PT Recommendations:  [place] / Referral to community resources:     Patient/Family's Response to care:  Patient is in agreement to returning back to SNF. Patient/Family's Understanding of and Emotional Response to Diagnosis, Current Treatment, and Prognosis:  Patient is aware of current treatment plan and diagnosis.  Emotional Assessment Appearance:  Appears stated age Attitude/Demeanor/Rapport:    Affect (typically observed):  Accepting, Calm Orientation:  Oriented to Place, Oriented to  Time, Oriented to Situation, Oriented to Self Alcohol / Substance use:  Not Applicable Psych involvement (Current and /or in the community):  No (Comment)  Discharge Needs  Concerns to be addressed:  Care Coordination Readmission within the last 30 days:  No Current discharge risk:  None Barriers to Discharge:  No Barriers Identified   Ross Ludwig, LCSWA 12/11/2016, 11:58 AM

## 2016-12-11 NOTE — Discharge Summary (Signed)
[facility] [place] NAME: [name]    MR#:  [id number]  DATE OF BIRTH:  19-Feb-1946  DATE OF ADMISSION:  12/09/2016 ADMITTING PHYSICIAN: Vaughan Basta, MD  DATE OF DISCHARGE: 12/11/2016  PRIMARY CARE PHYSICIAN: Jodi Marble, MD    ADMISSION DIAGNOSIS:  Hypokalemia [E87.6] Hyponatremia [E87.1] Acute renal insufficiency [N28.9] NSTEMI (non-ST elevated myocardial infarction) ([place]) [I21.4]  DISCHARGE DIAGNOSIS:  Principal Problem:   Demand ischemia [facility]) Active Problems:   Chest pain   SECONDARY DIAGNOSIS:   Past Medical History:  Diagnosis Date  . Abscess 10/08/2016  . Abscess of right leg excluding foot   . Acute on chronic renal failure ([place]) 08/23/2016  . Acute on chronic respiratory failure ([place]) 08/23/2016  . AKI (acute kidney injury) ([place]) 08/27/2016  . Arthritis   . Asthma   . Bipolar 1 disorder ([place]) 07/23/2016   Last Assessment & Plan:  On multiple meds including geodon and seems to be stable.    [name] CAD (coronary artery disease) 04/27/2016  . Cellulitis 09/21/2016  . Chest pain, rule out acute myocardial infarction 02/16/2016  . CHF (congestive heart failure) ([place])   . Chronic bronchitis ([place]) 07/23/2016   Last Assessment & Plan:  Breathing at baseline now with some chronic sob.   . Current use of proton pump inhibitor 08/23/2016  . Demand ischemia ([place]) 08/27/2016  . Diabetes ([place])   . DVT (deep venous thrombosis) ([place]) 08/27/2016  . Electrolyte imbalance 08/23/2016  . Elevated troponin 08/27/2016  . Heart trouble   . History of asthma 08/23/2016  . History of bipolar disorder 08/23/2016  . History of chronic pain 08/23/2016  . History of gastric ulcer 08/23/2016  . History of stomach ulcers   . History of stroke 07/23/2016   Last Assessment & Plan:  On asa/plavix so won't increase asa for now re dvt prevention  . HTN (hypertension) 04/27/2016  . Hypertension   . Lactic acidosis 08/23/2016  . 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 Obesity,  unspecified 11/22/2013  . OSA (obstructive sleep apnea) 08/23/2016  . Pulmonary embolism ([place]) 08/23/2016  . Reactive airway disease 11/22/2013  . S/P CABG x 2   . SIRS (systemic inflammatory response syndrome) ([place]) 08/23/2016  . Skin cancer   . Stroke [facility])     HOSPITAL COURSE:   71 year old past medical history of chronic chest pain, diabetes, history of DVT, bipolar disorder, CHF, hypertension, hyperlipidemia, history of coronary artery disease status post bypass, obstructive sleep apnea, obesity who presented to the hospital due to chest pain.  1. Chest pain-patient's chest pain is quite atypical as it's reproducible and likely musculoskeletal in nature. His troponins have not really tended upwards. -He had a recent nuclear medicine stress test done at his cardiologist's office which was negative. Discussed with Dr. Humphrey Rolls and no plans for acute intervention presently. -Continue aspirin, metoprolol - follow up with Cards as outpatient next week.   2. Hypokalemia - improving with supplementation and 3.3 upon discharge and pt. Will cont. His Potassium supplements.   3. CHF-chronic systolic CHF. Clinically patient was not in congestive heart failure while in the hospital.  -continue Lasix, metolazone, metoprolol.  4. Hypothyroidism- pt. Will continue Synthroid.  5. COPD- pt had no acute exacerbation.  - pt. Will cont. His Advair and albuterol as needed.   6. GERD- pt. Will continue Protonix.  7. History of previous DVT- pt. Will continue Xarelto.  8. History of bipolar disorder- pt. Will continue Geodon.  9. History of gout-no acute attack.  Pt. Will Continue Uloric.   DISCHARGE CONDITIONS:   Stable  CONSULTS OBTAINED:  Treatment Team:  Dionisio David, MD  DRUG ALLERGIES:   Allergies  Allergen Reactions  . Dilaudid [Hydromorphone Hcl] Other (See Comments)    "CANNOT HEAR, CANNOT THINK"    . Hydrocodone-Acetaminophen Nausea And Vomiting  . Hydromorphone Nausea  Only    Vomiting, diarrhea.  . Morphine And Related Other (See Comments)    Patient was told by a doctor that this "would kill" him  . Tetracycline Other (See Comments)    Patient was told by a doctor that this "would kill" him  . Tetracyclines & Related Other (See Comments)    Patient doesn't recall the reaction  . Penicillins Rash    Has patient had a PCN reaction causing immediate rash, facial/tongue/throat swelling, SOB or lightheadedness with hypotension: Yes Has patient had a PCN reaction causing severe rash involving mucus membranes or skin necrosis: No Has patient had a PCN reaction that required hospitalization No Has patient had a PCN reaction occurring within the last 10 years: No If all of the above answers are "NO", then may proceed with Cephalosporin use.   . Sulfa Antibiotics Rash    DISCHARGE MEDICATIONS:   Allergies as of 12/11/2016      Reactions   Dilaudid [hydromorphone Hcl] Other (See Comments)   "CANNOT HEAR, CANNOT THINK"   Hydrocodone-acetaminophen Nausea And Vomiting   Hydromorphone Nausea Only   Vomiting, diarrhea.   Morphine And Related Other (See Comments)   Patient was told by a doctor that this "would kill" him   Tetracycline Other (See Comments)   Patient was told by a doctor that this "would kill" him   Tetracyclines & Related Other (See Comments)   Patient doesn't recall the reaction   Penicillins Rash   Has patient had a PCN reaction causing immediate rash, facial/tongue/throat swelling, SOB or lightheadedness with hypotension: Yes Has patient had a PCN reaction causing severe rash involving mucus membranes or skin necrosis: No Has patient had a PCN reaction that required hospitalization No Has patient had a PCN reaction occurring within the last 10 years: No If all of the above answers are "NO", then may proceed with Cephalosporin use.   Sulfa Antibiotics Rash      Medication List    STOP taking these medications   ciprofloxacin 500 MG  tablet Commonly known as:  CIPRO   doxycycline 100 MG tablet Commonly known as:  VIBRA-TABS     TAKE these medications   acetaminophen 325 MG tablet Commonly known as:  TYLENOL Take 650 mg by mouth every 4 (four) hours as needed (general discomfort).   amLODipine 10 MG tablet Commonly known as:  NORVASC Take 10 mg by mouth daily.   aspirin 81 MG EC tablet Chew 81 mg by mouth daily.   BELSOMRA 10 MG Tabs Generic drug:  Suvorexant Take 10 mg by mouth at bedtime.   buPROPion 100 MG 12 hr tablet Commonly known as:  WELLBUTRIN SR Take 100 mg by mouth daily.   colchicine 0.6 MG tablet Take 0.6 mg by mouth daily.   doxepin 25 MG capsule Commonly known as:  SINEQUAN Take 25 mg by mouth at bedtime.   febuxostat 40 MG tablet Commonly known as:  ULORIC Take 40 mg by mouth daily.   fluticasone 50 MCG/ACT nasal spray Commonly known as:  FLONASE Place 1 spray into both nostrils daily.   Fluticasone-Salmeterol 100-50  MCG/DOSE Aepb Commonly known as:  ADVAIR Inhale 1 puff into the lungs 2 (two) times daily.   furosemide 40 MG tablet Commonly known as:  LASIX Take 40 mg by mouth 2 (two) times daily.   ipratropium-albuterol 0.5-2.5 (3) MG/3ML Soln Commonly known as:  DUONEB Inhale 3 mLs into the lungs every 4 (four) hours as needed (shortness of breath/wheezing).   iron polysaccharides 150 MG capsule Commonly known as:  NIFEREX Take 150 mg by mouth daily.   levothyroxine 100 MCG tablet Commonly known as:  SYNTHROID, LEVOTHROID Take 100 mcg by mouth daily before breakfast.   meclizine 25 MG tablet Commonly known as:  ANTIVERT Take 25 mg by mouth 2 (two) times daily as needed for dizziness.   metolazone 5 MG tablet Commonly known as:  ZAROXOLYN Take 5 mg by mouth daily.   metoprolol tartrate 50 MG tablet Commonly known as:  LOPRESSOR Take 50 mg by mouth 2 (two) times daily.   montelukast 10 MG tablet Commonly known as:  SINGULAIR Take 1 tablet by mouth at  bedtime.   pantoprazole 40 MG tablet Commonly known as:  PROTONIX Take 40 mg by mouth daily.   potassium chloride SA 20 MEQ tablet Commonly known as:  K-DUR,KLOR-CON Take 40 mEq by mouth daily.   PROAIR HFA 108 (90 Base) MCG/ACT inhaler Generic drug:  albuterol Inhale 2 puffs into the lungs every 4 (four) hours as needed for wheezing or shortness of breath.   rivaroxaban 20 MG Tabs tablet Commonly known as:  XARELTO Take 1 tablet (20 mg total) by mouth daily with supper.   senna-docusate 8.6-50 MG tablet Commonly known as:  Senokot-S Take 2 tablets by mouth daily.   ziprasidone 80 MG capsule Commonly known as:  GEODON Take 80 mg by mouth 2 (two) times daily with a meal.         DISCHARGE INSTRUCTIONS:   DIET:  Cardiac diet  DISCHARGE CONDITION:  Stable  ACTIVITY:  Activity as tolerated  OXYGEN:  Home Oxygen: No.   Oxygen Delivery: room air  DISCHARGE LOCATION:  nursing home   If you experience worsening of your admission symptoms, develop shortness of breath, life threatening emergency, suicidal or homicidal thoughts you must seek medical attention immediately by calling 911 or calling your MD immediately  if symptoms less severe.  You Must read complete instructions/literature along with all the possible adverse reactions/side effects for all the Medicines you take and that have been prescribed to you. Take any new Medicines after you have completely understood and accpet all the possible adverse reactions/side effects.   Please note  You were cared for by a hospitalist during your hospital stay. If you have any questions about your discharge medications or the care you received while you were in the hospital after you are discharged, you can call the unit and asked to speak with the hospitalist on call if the hospitalist that took care of you is not available. Once you are discharged, your primary care physician will handle any further medical issues. Please  note that NO REFILLS for any discharge medications will be authorized once you are discharged, as it is imperative that you return to your primary care physician (or establish a relationship with a primary care physician if you do not have one) for your aftercare needs so that they can reassess your need for medications and monitor your lab values.     Today   No acute events overnight.  Still having chest pain but  it's reproducible.  No shortness of breath, N/V.   VITAL SIGNS:  Blood pressure 117/70, pulse 83, temperature 98.5 F (36.9 C), temperature source Oral, resp. rate 18, height 5\' 7"  (1.702 m), weight (!) 146.2 kg (322 lb 6.4 oz), SpO2 95 %.  I/O:   Intake/Output Summary (Last 24 hours) at 12/11/16 1024 Last data filed at 12/11/16 0913  Gross per 24 hour  Intake                3 ml  Output                0 ml  Net                3 ml    PHYSICAL EXAMINATION:   GENERAL:  71 y.o.-year-old morbidly obese patient lying in bed in no acute distress.  EYES: Pupils equal, round, reactive to light and accommodation. No scleral icterus. Extraocular muscles intact.  HEENT: Head atraumatic, normocephalic. Oropharynx and nasopharynx clear.  NECK:  Supple, no jugular venous distention. No thyroid enlargement, no tenderness.  LUNGS: Normal breath sounds bilaterally, no wheezing, rales, rhonchi. No use of accessory muscles of respiration.  CARDIOVASCULAR: S1, S2 normal. No murmurs, rubs, or gallops.  ABDOMEN: Soft, nontender, nondistended. Bowel sounds present. No organomegaly or mass.  EXTREMITIES: No cyanosis, clubbing or edema b/l.    NEUROLOGIC: Cranial nerves II through XII are intact. No focal Motor or sensory deficits b/l. Globally weak  PSYCHIATRIC: The patient is alert and oriented x 3. SKIN: No obvious rash, lesion, or ulcer.   DATA REVIEW:   CBC  Recent Labs Lab 12/10/16 0403  WBC 9.8  HGB 13.1  HCT 41.0  PLT 328    Chemistries   Recent Labs Lab 12/10/16 1445  12/11/16 0601  NA  --  131*  K 2.8* 3.3*  CL  --  88*  CO2  --  31  GLUCOSE  --  168*  BUN  --  53*  CREATININE  --  1.53*  CALCIUM  --  9.6  MG 2.3  --     Cardiac Enzymes  Recent Labs Lab 12/09/16 2050  TROPONINI 0.08*    Microbiology Results  Results for orders placed or performed during the hospital encounter of 11/21/16  MRSA PCR Screening     Status: Abnormal   Collection Time: 11/21/16 10:00 AM  Result Value Ref Range Status   MRSA by PCR POSITIVE (A) NEGATIVE Final    Comment:        The GeneXpert MRSA Assay (FDA approved for NASAL specimens only), is one component of a comprehensive MRSA colonization surveillance program. It is not intended to diagnose MRSA infection nor to guide or monitor treatment for MRSA infections. RESULT CALLED TO, READ BACK BY AND VERIFIED WITH: Hansen Carino @ 1610 11/21/16 by [place]:  Dg Chest 2 View  Result Date: 12/09/2016 CLINICAL DATA:  Chest pain beginning at 8:30 this morning. Shortness of breath. EXAM: CHEST  2 VIEW COMPARISON:  CT chest 08/23/2016.  PA and lateral chest 10/08/2016. FINDINGS: The lungs are clear. Elevation of the right hemidiaphragm is unchanged. Heart size is normal. No pneumothorax or pleural effusion. The patient is status post CABG. Fractured upper median sternotomy wires are unchanged. IMPRESSION: No acute disease. Electronically Signed   By: Inge Rise M.D.   On: 12/09/2016 12:27      Management plans discussed with the patient, family and they are in agreement.  CODE STATUS:     Code Status Orders        Start     Ordered   12/09/16 1607  Full code  Continuous     12/09/16 1607      TOTAL TIME TAKING CARE OF THIS PATIENT: 40 minutes.    Henreitta Leber M.D on 12/11/2016 at 10:24 AM  Between 7am to 6pm - Pager - [phone]  After 6pm go to [email or web address] - Proofreader  [facility]  Office  [phone]  CC: Primary care  physician; Jodi Marble, MD

## 2016-12-14 ENCOUNTER — Encounter: Payer: Medicare Other | Admitting: Surgery

## 2016-12-14 DIAGNOSIS — E11622 Type 2 diabetes mellitus with other skin ulcer: Secondary | ICD-10-CM | POA: Diagnosis not present

## 2016-12-15 ENCOUNTER — Emergency Department
Admission: EM | Admit: 2016-12-15 | Discharge: 2016-12-15 | Disposition: A | Discharge status: Home / Self Care | Payer: Medicare Other | Attending: Emergency Medicine | Admitting: Emergency Medicine

## 2016-12-15 ENCOUNTER — Emergency Department: Payer: Medicare Other

## 2016-12-15 ENCOUNTER — Encounter: Payer: Self-pay | Admitting: Emergency Medicine

## 2016-12-15 DIAGNOSIS — J45909 Unspecified asthma, uncomplicated: Secondary | ICD-10-CM | POA: Insufficient documentation

## 2016-12-15 DIAGNOSIS — I2510 Atherosclerotic heart disease of native coronary artery without angina pectoris: Secondary | ICD-10-CM | POA: Diagnosis not present

## 2016-12-15 DIAGNOSIS — Z7982 Long term (current) use of aspirin: Secondary | ICD-10-CM | POA: Diagnosis not present

## 2016-12-15 DIAGNOSIS — I129 Hypertensive chronic kidney disease with stage 1 through stage 4 chronic kidney disease, or unspecified chronic kidney disease: Secondary | ICD-10-CM | POA: Insufficient documentation

## 2016-12-15 DIAGNOSIS — E119 Type 2 diabetes mellitus without complications: Secondary | ICD-10-CM | POA: Insufficient documentation

## 2016-12-15 DIAGNOSIS — F17210 Nicotine dependence, cigarettes, uncomplicated: Secondary | ICD-10-CM | POA: Insufficient documentation

## 2016-12-15 DIAGNOSIS — N183 Chronic kidney disease, stage 3 (moderate): Secondary | ICD-10-CM | POA: Diagnosis not present

## 2016-12-15 DIAGNOSIS — I5032 Chronic diastolic (congestive) heart failure: Secondary | ICD-10-CM | POA: Insufficient documentation

## 2016-12-15 DIAGNOSIS — Z79899 Other long term (current) drug therapy: Secondary | ICD-10-CM | POA: Diagnosis not present

## 2016-12-15 DIAGNOSIS — R079 Chest pain, unspecified: Secondary | ICD-10-CM

## 2016-12-15 LAB — BASIC METABOLIC PANEL
Anion gap: 14 (ref 5–15)
BUN: 50 mg/dL — AB (ref 6–20)
CO2: 30 mmol/L (ref 22–32)
CREATININE: 1.83 mg/dL — AB (ref 0.61–1.24)
Calcium: 10 mg/dL (ref 8.9–10.3)
Chloride: 87 mmol/L — ABNORMAL LOW (ref 101–111)
GFR calc Af Amer: 41 mL/min — ABNORMAL LOW (ref >60)
GFR, EST NON AFRICAN AMERICAN: 35 mL/min — AB (ref >60)
GLUCOSE: 205 mg/dL — AB (ref 65–99)
POTASSIUM: 3 mmol/L — AB (ref 3.5–5.1)
Sodium: 131 mmol/L — ABNORMAL LOW (ref 135–145)

## 2016-12-15 LAB — CBC
HEMATOCRIT: 42.6 % (ref 40.0–52.0)
Hemoglobin: 13.8 g/dL (ref 13.0–18.0)
MCH: 25 pg — ABNORMAL LOW (ref 26.0–34.0)
MCHC: 32.4 g/dL (ref 32.0–36.0)
MCV: 77.2 fL — ABNORMAL LOW (ref 80.0–100.0)
PLATELETS: 343 10*3/uL (ref 150–440)
RBC: 5.52 MIL/uL (ref 4.40–5.90)
RDW: 17.7 % — AB (ref 11.5–14.5)
WBC: 8.7 10*3/uL (ref 3.8–10.6)

## 2016-12-15 LAB — TROPONIN I
TROPONIN I: 0.07 ng/mL — AB (ref <0.03)
Troponin I: 0.06 ng/mL (ref <0.03)

## 2016-12-15 MED ORDER — FENTANYL CITRATE (PF) 100 MCG/2ML IJ SOLN
50.0000 ug | Freq: Once | INTRAMUSCULAR | Status: AC
  Administered 2016-12-15: 50 ug via INTRAVENOUS
  Filled 2016-12-15: qty 2

## 2016-12-15 NOTE — Discharge Instructions (Signed)
You have been seen in the emergency department today for chest pain. Your workup has shown normal results. As we discussed please follow-up with your primary care physician in the next 1-2 days for recheck. Return to the emergency department for any further chest pain, trouble breathing, or any other symptom personally concerning to yourself. °

## 2016-12-15 NOTE — ED Notes (Signed)
Attempted to call [name], and [name] (patient's listed daughters) as well as [facility] without answer.  Patient has legal guardian listed but no number available.

## 2016-12-15 NOTE — ED Notes (Signed)
Pt given meal tray.

## 2016-12-15 NOTE — ED Triage Notes (Signed)
Pt to ED via EMS from [facility] c/o CP to center of chest starting approx. 3hrs PTA while lying in bed.  Pt recently here and discharged for reproductive CP, hypokalemia, hyponatremia and renal insufficiency.  Pt states SOB, deep breathing makes pain better, pain with palpation across whole chest.  EMS vitals: 119/73 BP, 267 CBG, 98.6 temp, 96% RA, 80 HR, 18 RR.  Pt presents A&Ox4, speaking in complete and coherent sentences, chest rise even and unlabored.

## 2016-12-15 NOTE — ED Notes (Signed)
Date and time results received: 12/15/16 5:23 PM (use smartphrase ".now" to insert current time)  Test: Troponin Critical Value: 0.06  Name of Provider Notified: Dr. Kerman Passey  Orders Received? Or Actions Taken?: No new orders at this time.

## 2016-12-15 NOTE — Progress Notes (Signed)
DIXIE, JAFRI ([id number]) Visit Report for 12/14/2016 Arrival Information Details Patient Name: [name], [name]. Date of Service: 12/14/2016 8:45 AM Medical Record Number: [id number] Patient Account Number: [id number] Date of Birth/Sex: 11-Jul-1945 (71 y.o. Male) Treating RN: Ahmed Prima Primary Care Tylique Aull: [name] Dupre Other Clinician: Referring Prudencio Velazco: [name] Dupre Treating Terald Jump/Extender: Frann Rider in Treatment: 40 Visit Information History Since Last Visit All ordered tests and consults were completed: No Patient Arrived: Wheel Chair Added or deleted any medications: No Arrival Time: 08:38 Any new allergies or adverse reactions: No Accompanied By: self Had a fall or experienced change in No Transfer Assistance: EasyPivot activities of daily living that may affect Patient Lift risk of falls: Patient Identification Verified: Yes Signs or symptoms of abuse/neglect since last No Secondary Verification Process Yes visito Completed: Hospitalized since last visit: No Patient Requires Transmission- No Has Dressing in Place as Prescribed: Yes Based Precautions: Pain Present Now: No Patient Has Alerts: No Electronic Signature(s) Signed: 12/14/2016 4:27:45 PM By: Alric Quan Entered By: Alric Quan on 12/14/2016 08:38:53 [name], [name] ([id number]) -------------------------------------------------------------------------------- Clinic Level of Care Assessment Details Patient Name: [name] Mule. Date of Service: 12/14/2016 8:45 AM Medical Record Number: [id number] Patient Account Number: [id number] Date of Birth/Sex: 19-Apr-1946 (72 y.o. Male) Treating RN: Ahmed Prima Primary Care Muriel Wilber: [name] Dupre Other Clinician: Referring Kaushal Vannice: [name] Dupre Treating Jmichael Gille/Extender: Frann Rider in Treatment: 17 Clinic Level of Care Assessment Items TOOL 4 Quantity Score X - Use when only an EandM is performed  on FOLLOW-UP visit 1 0 ASSESSMENTS - Nursing Assessment / Reassessment X - Reassessment of Co-morbidities (includes updates in patient status) 1 10 X - Reassessment of Adherence to Treatment Plan 1 5 ASSESSMENTS - Wound and Skin Assessment / Reassessment X - Simple Wound Assessment / Reassessment - one wound 1 5 []  - Complex Wound Assessment / Reassessment - multiple wounds 0 []  - Dermatologic / Skin Assessment (not related to wound area) 0 ASSESSMENTS - Focused Assessment []  - Circumferential Edema Measurements - multi extremities 0 []  - Nutritional Assessment / Counseling / Intervention 0 []  - Lower Extremity Assessment (monofilament, tuning fork, pulses) 0 []  - Peripheral Arterial Disease Assessment (using hand held doppler) 0 ASSESSMENTS - Ostomy and/or Continence Assessment and Care []  - Incontinence Assessment and Management 0 []  - Ostomy Care Assessment and Management (repouching, etc.) 0 PROCESS - Coordination of Care []  - Simple Patient / Family Education for ongoing care 0 X - Complex (extensive) Patient / Family Education for ongoing care 1 20 X - Staff obtains Programmer, systems, Records, Test Results / Process Orders 1 10 X - Staff telephones HHA, Nursing Homes / Clarify orders / etc 1 10 []  - Routine Transfer to another Facility (non-emergent condition) 0 Paletta, Enzio T. ([id number]) []  - Routine Hospital Admission (non-emergent condition) 0 []  - New Admissions / Biomedical engineer / Ordering NPWT, Apligraf, etc. 0 []  - Emergency Hospital Admission (emergent condition) 0 X - Simple Discharge Coordination 1 10 []  - Complex (extensive) Discharge Coordination 0 PROCESS - Special Needs []  - Pediatric / Minor Patient Management 0 []  - Isolation Patient Management 0 []  - Hearing / Language / Visual special needs 0 []  - Assessment of Community assistance (transportation, D/C planning, etc.) 0 []  - Additional assistance / Altered mentation 0 []  - Support Surface(s) Assessment  (bed, cushion, seat, etc.) 0 INTERVENTIONS - Wound Cleansing / Measurement X - Simple Wound Cleansing - one wound 1 5 []  - Complex Wound Cleansing - multiple wounds 0  X - Wound Imaging (photographs - any number of wounds) 1 5 []  - Wound Tracing (instead of photographs) 0 X - Simple Wound Measurement - one wound 1 5 []  - Complex Wound Measurement - multiple wounds 0 INTERVENTIONS - Wound Dressings X - Small Wound Dressing one or multiple wounds 1 10 []  - Medium Wound Dressing one or multiple wounds 0 []  - Large Wound Dressing one or multiple wounds 0 X - Application of Medications - topical 1 5 []  - Application of Medications - injection 0 INTERVENTIONS - Miscellaneous []  - External ear exam 0 [name], [name] ([id number]) []  - Specimen Collection (cultures, biopsies, blood, body fluids, etc.) 0 []  - Specimen(s) / Culture(s) sent or taken to Lab for analysis 0 []  - Patient Transfer (multiple staff / Harrel Lemon Lift / Similar devices) 0 []  - Simple Staple / Suture removal (25 or less) 0 []  - Complex Staple / Suture removal (26 or more) 0 []  - Hypo / Hyperglycemic Management (close monitor of Blood Glucose) 0 []  - Ankle / Brachial Index (ABI) - do not check if billed separately 0 X - Vital Signs 1 5 Has the patient been seen at the hospital within the last three years: Yes Total Score: 105 Level Of Care: New/Established - Level 3 Electronic Signature(s) Signed: 12/14/2016 4:27:45 PM By: Alric Quan Entered By: Alric Quan on 12/14/2016 09:25:30 [name], [name] ([id number]) -------------------------------------------------------------------------------- Encounter Discharge Information Details Patient Name: [name] Gathers T. Date of Service: 12/14/2016 8:45 AM Medical Record Number: [id number] Patient Account Number: [id number] Date of Birth/Sex: 12-Nov-1945 (71 y.o. Male) Treating RN: Ahmed Prima Primary Care Bentzion Dauria: [name] Dupre Other Clinician: Referring Alizea Pell:  [name] Dupre Treating Anita Laguna/Extender: Frann Rider in Treatment: 52 Encounter Discharge Information Items Discharge Pain Level: 0 Discharge Condition: Stable Ambulatory Status: Wheelchair Discharge Destination: Nursing Home Transportation: Other Accompanied By: self Schedule Follow-up Appointment: Yes Medication Reconciliation completed and provided to Patient/Care No Keyara Ent: Provided on Clinical Summary of Care: 12/14/2016 Form Type Recipient Paper Patient JE Electronic Signature(s) Signed: 12/14/2016 9:14:22 AM By: Ruthine Dose Entered By: Ruthine Dose on 12/14/2016 09:14:22 Funderburke, [name] ([id number]) -------------------------------------------------------------------------------- Lower Extremity Assessment Details Patient Name: [name] Gathers T. Date of Service: 12/14/2016 8:45 AM Medical Record Number: [id number] Patient Account Number: [id number] Date of Birth/Sex: 1946/05/25 (71 y.o. Male) Treating RN: Ahmed Prima Primary Care Argyle Gustafson: [name] Dupre Other Clinician: Referring Zailah Zagami: [name] Dupre Treating Treshaun Carrico/Extender: Frann Rider in Treatment: 17 Vascular Assessment Pulses: Dorsalis Pedis Palpable: [Right:Yes] Posterior Tibial Extremity colors, hair growth, and conditions: Extremity Color: [Right:Hyperpigmented] Temperature of Extremity: [Right:Warm] Capillary Refill: [Right:> 3 seconds] Toe Nail Assessment Left: Right: Thick: Yes Discolored: Yes Deformed: Yes Improper Length and Hygiene: Yes Electronic Signature(s) Signed: 12/14/2016 4:27:45 PM By: Alric Quan Entered By: Alric Quan on 12/14/2016 08:54:08 [name], [name] ([id number]) -------------------------------------------------------------------------------- Multi Wound Chart Details Patient Name: [name] Gathers T. Date of Service: 12/14/2016 8:45 AM Medical Record Number: [id number] Patient Account Number: [id number] Date of Birth/Sex:  04-03-1946 (71 y.o. Male) Treating RN: Ahmed Prima Primary Care Sumit Branham: [name] Dupre Other Clinician: Referring Duriel Deery: [name] Dupre Treating Eduard Penkala/Extender: Frann Rider in Treatment: 17 Vital Signs Height(in): 67 Pulse(bpm): 71 Weight(lbs): 357 Blood Pressure 95/62 (mmHg): Body Mass Index(BMI): 56 Temperature(F): 98.5 Respiratory Rate 16 (breaths/min): Photos: [1:No Photos] [N/A:N/A] Wound Location: [1:Right Knee - Anterior] [N/A:N/A] Wounding Event: [1:Pressure Injury] [N/A:N/A] Primary Etiology: [1:Diabetic Wound/Ulcer of the Lower Extremity] [N/A:N/A] Comorbid History: [1:Anemia, Asthma, Arrhythmia, Congestive Heart Failure, Coronary Artery Disease, Hypertension, Type II Diabetes, Osteoarthritis] [N/A:N/A]  Date Acquired: [1:07/23/2016] [N/A:N/A] Weeks of Treatment: [1:17] [N/A:N/A] Wound Status: [1:Open] [N/A:N/A] Measurements L x W x D 0.2x0.6x0.2 [N/A:N/A] (cm) Area (cm) : [1:0.094] [N/A:N/A] Volume (cm) : [1:0.019] [N/A:N/A] % Reduction in Area: [1:98.80%] [N/A:N/A] % Reduction in Volume: 97.60% [N/A:N/A] Starting Position 1 6 (o'clock): Ending Position 1 (o'clock): Maximum Distance 1 4.6 (cm): Undermining: [1:Yes] [N/A:N/A] Classification: [1:Grade 1] [N/A:N/A] Exudate Amount: [1:Large] [N/A:N/A] Exudate Type: [1:Serosanguineous] [N/A:N/A] Exudate Color: red, brown N/A N/A Wound Margin: Flat and Intact N/A N/A Granulation Amount: Large (67-100%) N/A N/A Granulation Quality: Red N/A N/A Necrotic Amount: Small (1-33%) N/A N/A Necrotic Tissue: Eschar, Adherent Slough N/A N/A Exposed Structures: Fat Layer (Subcutaneous N/A N/A Tissue) Exposed: Yes Fascia: No Tendon: No Muscle: No Joint: No Bone: No Epithelialization: None N/A N/A Periwound Skin Texture: Excoriation: No N/A N/A Induration: No Callus: No Crepitus: No Rash: No Scarring: No Periwound Skin Maceration: No N/A N/A Moisture: Dry/Scaly: No Periwound Skin  Color: Erythema: Yes N/A N/A Atrophie Blanche: No Cyanosis: No Ecchymosis: No Hemosiderin Staining: No Mottled: No Pallor: No Rubor: No Erythema Location: Circumferential N/A N/A Temperature: No Abnormality N/A N/A Tenderness on Yes N/A N/A Palpation: Wound Preparation: Ulcer Cleansing: N/A N/A Rinsed/Irrigated with Saline Topical Anesthetic Applied: Other: Lidocaine 4% Treatment Notes Electronic Signature(s) Signed: 12/14/2016 9:00:35 AM By: Christin Fudge MD, FACS Entered By: Christin Fudge on 12/14/2016 09:00:35 [name], [name] ([id number]) -------------------------------------------------------------------------------- [facility] Details Patient Name: [name] Gathers T. Date of Service: 12/14/2016 8:45 AM Medical Record Number: [id number] Patient Account Number: [id number] Date of Birth/Sex: 05/12/1946 (71 y.o. Male) Treating RN: Carolyne Fiscal, Debi Primary Care Anavictoria Wilk: [name] Dupre Other Clinician: Referring Trystin Terhune: [name] Dupre Treating Ulla Mckiernan/Extender: Frann Rider in Treatment: 20 Active Inactive ` Orientation to the Wound Care Program Nursing Diagnoses: Knowledge deficit related to the wound healing center program Goals: Patient/caregiver will verbalize understanding of the [place] Program Date Initiated: 08/14/2016 Target Resolution Date: 12/14/2016 Goal Status: Active Interventions: Provide education on orientation to the wound center Notes: ` Pressure Nursing Diagnoses: Knowledge deficit related to causes and risk factors for pressure ulcer development Knowledge deficit related to management of pressures ulcers Potential for impaired tissue integrity related to pressure, friction, moisture, and shear Goals: Patient will remain free from development of additional pressure ulcers Date Initiated: 08/14/2016 Target Resolution Date: 12/14/2016 Goal Status: Active Patient will remain free of pressure  ulcers Date Initiated: 08/14/2016 Target Resolution Date: 12/14/2016 Goal Status: Active Patient/caregiver will verbalize risk factors for pressure ulcer development Date Initiated: 08/14/2016 Target Resolution Date: 12/14/2016 Goal Status: Active Patient/caregiver will verbalize understanding of pressure ulcer management Date Initiated: 08/14/2016 Target Resolution Date: 12/14/2016 Goal Status: Active [name], [name] ([id number]) Interventions: Assess: immobility, friction, shearing, incontinence upon admission and as needed Assess offloading mechanisms upon admission and as needed Assess potential for pressure ulcer upon admission and as needed Provide education on pressure ulcers Treatment Activities: Patient referred for seating evaluation to ensure proper offloading : 08/14/2016 Pressure reduction/relief device ordered : 08/14/2016 Notes: ` Wound/Skin Impairment Nursing Diagnoses: Impaired tissue integrity Knowledge deficit related to ulceration/compromised skin integrity Goals: Patient/caregiver will verbalize understanding of skin care regimen Date Initiated: 08/14/2016 Target Resolution Date: 12/14/2016 Goal Status: Active Ulcer/skin breakdown will have a volume reduction of 30% by week 4 Date Initiated: 08/14/2016 Target Resolution Date: 12/14/2016 Goal Status: Active Ulcer/skin breakdown will have a volume reduction of 50% by week 8 Date Initiated: 08/14/2016 Target Resolution Date: 12/14/2016 Goal Status: Active Ulcer/skin breakdown will have a volume reduction of  80% by week 12 Date Initiated: 08/14/2016 Target Resolution Date: 12/14/2016 Goal Status: Active Ulcer/skin breakdown will heal within 14 weeks Date Initiated: 08/14/2016 Target Resolution Date: 12/14/2016 Goal Status: Active Interventions: Assess patient/caregiver ability to obtain necessary supplies Assess patient/caregiver ability to perform ulcer/skin care regimen upon admission and as needed Assess  ulceration(s) every visit Provide education on ulcer and skin care Treatment Activities: Referred to DME Yuki Brunsman for dressing supplies : 08/14/2016 Skin care regimen initiated : 08/14/2016 [name], [name] ([id number]) Topical wound management initiated : 08/14/2016 Notes: Electronic Signature(s) Signed: 12/14/2016 4:27:45 PM By: Alric Quan Entered By: Alric Quan on 12/14/2016 08:54:13 [name], [name] ([id number]) -------------------------------------------------------------------------------- Pain Assessment Details Patient Name: [name] Gathers T. Date of Service: 12/14/2016 8:45 AM Medical Record Number: [id number] Patient Account Number: [id number] Date of Birth/Sex: [birth year] (71 y.o. Male) Treating RN: Ahmed Prima Primary Care Cass Edinger: [name] Dupre Other Clinician: Referring Kattaleya Alia: [name] Dupre Treating Airyanna Dipalma/Extender: Frann Rider in Treatment: 70 Active Problems Location of Pain Severity and Description of Pain Patient Has Paino No Site Locations With Dressing Change: No Pain Management and Medication Current Pain Management: Electronic Signature(s) Signed: 12/14/2016 4:27:45 PM By: Alric Quan Entered By: Alric Quan on 12/14/2016 08:38:59 Feldhaus, [name] ([id number]) -------------------------------------------------------------------------------- Patient/Caregiver Education Details Patient Name: [name] Mule. Date of Service: 12/14/2016 8:45 AM Medical Record Number: [id number] Patient Account Number: [id number] Date of Birth/Gender: Jun 03, 1945 (71 y.o. Male) Treating RN: Ahmed Prima Primary Care Physician: [name] Dupre Other Clinician: Referring Physician: [name] Dupre Treating Physician/Extender: Frann Rider in Treatment: 17 Education Assessment Education Provided To: Patient Education Topics Provided Wound/Skin Impairment: Handouts: Other: change dressing as ordered Methods:  Demonstration, Explain/Verbal Responses: State content correctly Electronic Signature(s) Signed: 12/14/2016 4:27:45 PM By: Alric Quan Entered By: Alric Quan on 12/14/2016 09:08:56 [name], [name] ([id number]) -------------------------------------------------------------------------------- Wound Assessment Details Patient Name: [name] Gathers T. Date of Service: 12/14/2016 8:45 AM Medical Record Number: [id number] Patient Account Number: [id number] Date of Birth/Sex: 03/04/1946 (71 y.o. Male) Treating RN: Ahmed Prima Primary Care Jaquita Bessire: [name] Dupre Other Clinician: Referring Angelea Penny: [name] Dupre Treating Derian Dimalanta/Extender: Frann Rider in Treatment: 17 Wound Status Wound Number: 1 Primary Diabetic Wound/Ulcer of the Lower Etiology: Extremity Wound Location: Right Knee - Anterior Wound Open Wounding Event: Pressure Injury Status: Date Acquired: 07/23/2016 Comorbid Anemia, Asthma, Arrhythmia, Weeks Of Treatment: 17 History: Congestive Heart Failure, Coronary Clustered Wound: No Artery Disease, Hypertension, Type II Diabetes, Osteoarthritis Photos Photo Uploaded By: Alric Quan on 12/14/2016 11:42:11 Wound Measurements Length: (cm) 0.2 % Reduction in Width: (cm) 0.6 % Reduction in Depth: (cm) 0.2 Epithelializati Area: (cm) 0.094 Tunneling: Volume: (cm) 0.019 Undermining: Starting Pos Maximum Dist Area: 98.8% Volume: 97.6% on: None No Yes ition (o'clock): 6 ance: (cm) 4.6 Wound Description Classification: Grade 1 Foul Odor Afte Wound Margin: Flat and Intact Slough/Fibrino Exudate Amount: Large Exudate Type: Serosanguineous Exudate Color: red, brown r Cleansing: No No Wound Bed [name], [name] ([id number]) Granulation Amount: Large (67-100%) Exposed Structure Granulation Quality: Red Fascia Exposed: No Necrotic Amount: Small (1-33%) Fat Layer (Subcutaneous Tissue) Exposed: Yes Necrotic Quality: Eschar, Adherent  Slough Tendon Exposed: No Muscle Exposed: No Joint Exposed: No Bone Exposed: No Periwound Skin Texture Texture Color No Abnormalities Noted: No No Abnormalities Noted: No Callus: No Atrophie Blanche: No Crepitus: No Cyanosis: No Excoriation: No Ecchymosis: No Induration: No Erythema: Yes Rash: No Erythema Location: Circumferential Scarring: No Hemosiderin Staining: No Mottled: No Moisture Pallor: No No Abnormalities Noted: No Rubor: No Dry / Scaly: No Maceration: No Temperature /  Pain Temperature: No Abnormality Tenderness on Palpation: Yes Wound Preparation Ulcer Cleansing: Rinsed/Irrigated with Saline Topical Anesthetic Applied: Other: Lidocaine 4%, Treatment Notes Wound #1 (Right, Anterior Knee) 1. Cleansed with: Clean wound with Normal Saline 2. Anesthetic Topical Lidocaine 4% cream to wound bed prior to debridement 4. Dressing Applied: Iodoform packing Gauze 5. Secondary Dressing Applied ABD Pad Dry Gauze Kerlix/Conform 7. Secured with Recruitment consultant) Signed: 12/14/2016 4:27:45 PM By: Alric Quan Entered By: Alric Quan on 12/14/2016 08:53:03 [name], [name] ([id number]) Jaime, [name] ([id number]) -------------------------------------------------------------------------------- Vitals Details Patient Name: [name] Gathers T. Date of Service: 12/14/2016 8:45 AM Medical Record Number: [id number] Patient Account Number: [id number] Date of Birth/Sex: Mar 06, 1946 (71 y.o. Male) Treating RN: Ahmed Prima Primary Care Elizette Shek: [name] Dupre Other Clinician: Referring Azilee Pirro: [name] Dupre Treating Radin Raptis/Extender: Frann Rider in Treatment: 17 Vital Signs Time Taken: 08:39 Temperature (F): 98.5 Height (in): 67 Pulse (bpm): 71 Weight (lbs): 357 Respiratory Rate (breaths/min): 16 Body Mass Index (BMI): 55.9 Blood Pressure (mmHg): 95/62 Reference Range: 80 - 120 mg / dl Electronic Signature(s) Signed:  12/14/2016 4:27:45 PM By: Alric Quan Entered By: Alric Quan on 12/14/2016 08:45:39

## 2016-12-15 NOTE — Progress Notes (Signed)
NASARIO, CZERNIAK ([id number]) Visit Report for 12/14/2016 Chief Complaint Document Details Patient Name: Fricke, HULET EHRMANN. Date of Service: 12/14/2016 8:45 AM Medical Record Number: [id number] Patient Account Number: [id number] Date of Birth/Sex: 1945-10-21 (71 y.o. Male) Treating RN: Ahmed Prima Primary Care Provider: [name] Dupre Other Clinician: Referring Provider: [name] Dupre Treating Provider/Extender: Frann Rider in Treatment: 17 Information Obtained from: Patient Chief Complaint Patient is at the clinic for treatment of an open pressure ulcer to the area of the right knee Electronic Signature(s) Signed: 12/14/2016 9:00:42 AM By: Christin Fudge MD, FACS Entered By: Christin Fudge on 12/14/2016 09:00:42 Pottle, [name] ([id number]) -------------------------------------------------------------------------------- HPI Details Patient Name: [name] T. Date of Service: 12/14/2016 8:45 AM Medical Record Number: [id number] Patient Account Number: [id number] Date of Birth/Sex: [birth year] (71 y.o. Male) Treating RN: Ahmed Prima Primary Care Provider: [name] Dupre Other Clinician: Referring Provider: [name] Dupre Treating Provider/Extender: Frann Rider in Treatment: 17 History of Present Illness Location: right knee swelling with skin ulceration Quality: Patient reports experiencing a dull pain to affected area(s). Severity: Patient states wound are getting worse. Duration: Patient has had the wound for <4 weeks prior to presenting for treatment Timing: Pain in wound is Intermittent (comes and goes Context: The wound occurred when the patient had a cast placed over his right lower extremity and with the cast was removed he had a swelling of the knee with problems with his skin Modifying Factors: Other treatment(s) tried include:orthopedics Associated Signs and Symptoms: swelling of both lower extremities with large amount of swelling of  the knee HPI Description: 71 year old gentleman who has been living in an independent living facility for long-term care has had recurrent falls and recently had a large hematoma on the right knee. Known to be on aspirin and Plavix for history of CABG and was seen in the ER at the end of February after a fall. past medical history of arthritis, congestive heart failure, diabetes mellitus, hypertension, status post CABG o2, status post cholecystectomy, hernia repair and kidney surgery. recent workup for the fall showed negative clinical findings in his extremities and it was thought that he was falling due to deconditioning. a x-ray showed a right fibular neck fracture and was placed in a splint. he was asked to be seen by orthopedic surgeon. the patient cannot tell me which orthopedic doctor had seen him and I'm not able to get any notes from [place], regarding his orthopedic care. The patient says twice during this last week he has had bloody fluid shooting out of his knee and has drained all over his boot and the flow. 08/21/2016 -- he was seen by the nurse practitioner at the facility and and distend she aspirated about 60 mL of hemorrhagic fluid from his right knee. 08/31/2016 -- since I saw the patient last he was admitted to the hospital at [facility] between 08/23/2016 2 08/27/2016 and was treated with a pulmonary embolism. he had EKOS complete, was started on Xarelto, and also treated for chronic diastolic congestive heart failure. he was asked to follow-up with the wound center and continue management locally. 09/07/2016 -- he has an appointment to see his orthopedic surgeon later this week. 09/14/2016 -- he did see the orthopedic service and they have asked him to continue with physical therapy. They discontinued the boot and the x-ray done showed a healed fracture. They said it was okay to use a wound VAC if needed. Addendum -- we got the note from the Orthopedic office where he was  seen by [facility] orthopedics on [facility] in [place] and was seen by the PA Carlynn Spry. -- The x-ray which was done was reviewed and there is good healing of the fracture and from the orthopedic point of view he has healed well and they have asked him to continue with good control of his diabetes and management by the wound center including using a wound VAC if required. WINDSOR, [name] ([id number]) 09/21/2016 -- the patient's wound VAC had been applied 2 days ago, but the black sponge had not been placed laterally under the skin flap and into the area of the right of the knee, where there was a large cavity. On examination, after removal of his wound VAC, there is a large collection of seropurulent fluid and hematoma in the area starting at the lateral knee and going down to his right lateral compartment of the leg. Clinically, this is a huge infected abscess cavity 10/01/2016 -- patient was admitted between 09/21/2016 and 09/23/2016 with cellulitis of the right lower extremity with a large infected hematoma which was drained at the bedside by surgical services. he was treated with Levaquin and continues to be on Xarelto. the patient is back on a wound VAC which has not been functioning due to the large depth of the hematoma and organized clots. He is still on Keflex orally. Addendum : I spoke to his surgeon Dr. Tama High and we discussed options for further draining this large hematoma which I suspect will get infected sooner than later. I expressed the fact that the wound VAC does not seem to be functioning well and competent enough to drain this large cavity. He will call for him in the office. IZT24 2018 -- the patient has hypotension in the sitting position and is a bit lethargic as compared to his normal self. I spoke personally to his nurse at the nursing homes, Ms Judson Roch and discussed his management including the fact that he had to have a surgical consultation for the  right lower leg, with Dr. Tama High. Prior to the patient's discharge from the wound center he continues to have hypotension and hence I'm going to talk to the ER physician and have him evaluated in the ER. 10/15/2016 -- the patient was admitted to the hospital between 10/08/2016 and 10/12/2016. He was treated for hypokalemia, hyponatremia, renal insufficiency, sepsis and possible abscess of the right lower leg. he was seen by surgical services and the fluid collection was drained with a Yankauer suction from the knee wound and the opinion of the surgeons was that he would not need any surgical intervention and continue with local wound care. I had personally communicated with Dr. Tama High regarding this. the patient is still on Xarelto. During the admission he was also seen by Dr. Ola Spurr for infectious disease consult and he recommended continue vancomycin for MRSA and change meropenem to ceftriaxone for this Proteus. He recommended a four-week course of antibiotic therapy and follow-up back with Dr. Ola Spurr in 4 weeks. before his discharge since there was clinical improvement Dr. Ola Spurr converted the treatment to oral clindamycin and oral Keflex for a four-week course of antibiotic therapy. He was to be monitored for C. difficile. 10/29/2016 -- he missed his appointment with Dr. Rosana Hoes at the surgical office. He continues to be on oral antibiotics as per Dr. Ola Spurr. 11/16/16 patient's wound on the right knee continues to trouble him. He tells me that the nursing facility has not changed this dressing in almost a week  and his wound is doing somewhat worse today in regard to undermining compared to last week's evaluation. He continues to be frustrated with this. Unfortunately he has tried to switch facilities but was not able to get into any other so far. 11/30/16 Patient's wound appears to be doing well on evaluation today. Fortunately he is tolerating the Iodoform packing  which it does sound the facility is performing appropriately. Nonetheless he still continues to have some discomfort there is no evidence of infection today. 12/07/16 on evaluation today patient appears to be doing fairly well in regard to his wound externally although it is somewhat deeper than on previous evaluation. Obviously this seems to be a result of over packing based on what we are seeing here in the office today. I'm happy they are packing the wound but at Ellis, Joaovictor T. ([id number]) the same time we meet need to be sure not to overpack which can worsen the depth which appears to be what happened. Fortunately there's no evidence of infection. Electronic Signature(s) Signed: 12/14/2016 9:00:48 AM By: Christin Fudge MD, FACS Entered By: Christin Fudge on 12/14/2016 09:00:48 [name] ([id number]) -------------------------------------------------------------------------------- Physical Exam Details Patient Name: [name] T. Date of Service: 12/14/2016 8:45 AM Medical Record Number: [id number] Patient Account Number: [id number] Date of Birth/Sex: Jan 12, 1946 (71 y.o. Male) Treating RN: Ahmed Prima Primary Care Provider: [name] Dupre Other Clinician: Referring Provider: [name] Dupre Treating Provider/Extender: Frann Rider in Treatment: 17 Constitutional . Pulse regular. Respirations normal and unlabored. Afebrile. . Eyes Nonicteric. Reactive to light. Ears, Nose, Mouth, and Throat Lips, teeth, and gums WNL.[name] Moist mucosa without lesions. Neck supple and nontender. No palpable supraclavicular or cervical adenopathy. Normal sized without goiter. Respiratory WNL. No retractions.. Breath sounds WNL, No rubs, rales, rhonchi, or wheeze.. Cardiovascular Heart rhythm and rate regular, no murmur or gallop.. Pedal Pulses WNL. No clubbing, cyanosis or edema. Chest Breasts symmetical and no nipple discharge.. Breast tissue WNL, no masses, lumps, or  tenderness.. Lymphatic No adneopathy. No adenopathy. No adenopathy. Musculoskeletal Adexa without tenderness or enlargement.. Digits and nails w/o clubbing, cyanosis, infection, petechiae, ischemia, or inflammatory conditions.. Integumentary (Hair, Skin) No suspicious lesions. No crepitus or fluctuance. No peri-wound warmth or erythema. No masses.[name] Psychiatric Judgement and insight Intact.. No evidence of depression, anxiety, or agitation.. Notes most of the wound is completely healed except the lateral part where there is undermining and tunneling minimally. There is no evidence of cellulitis. Electronic Signature(s) Signed: 12/14/2016 9:01:13 AM By: Christin Fudge MD, FACS Entered By: Christin Fudge on 12/14/2016 09:01:13 [name] ([id number]) -------------------------------------------------------------------------------- Physician Orders Details Patient Name: [name] T. Date of Service: 12/14/2016 8:45 AM Medical Record Number: [id number] Patient Account Number: [id number] Date of Birth/Sex: 1946-05-14 (71 y.o. Male) Treating RN: Ahmed Prima Primary Care Provider: [name] Dupre Other Clinician: Referring Provider: [name] Dupre Treating Provider/Extender: Frann Rider in Treatment: 68 Verbal / Phone Orders: Yes Clinician: Carolyne Fiscal, Debi Read Back and Verified: Yes Diagnosis Coding Wound Cleansing Wound #1 Right,Anterior Knee o Clean wound with Normal Saline. o Cleanse wound with mild soap and water Anesthetic Wound #1 Right,Anterior Knee o Topical Lidocaine 4% cream applied to wound bed prior to debridement - Only in the Wound Clinic Primary Wound Dressing Wound #1 Right,Anterior Knee o Iodoform packing Gauze - pack lightly in wound (ONLY ONE SINGLE LAYER) (PACK ONLY AT 4.4 CM ONLY AND LEAVE A TAIL TO REMOVE) PLEASE FOLLOW THESE ORDERS Secondary Dressing Wound #1 Right,Anterior Knee o ABD pad o Dry Gauze o  Conform/Kerlix o Other - tape Dressing Change Frequency Wound #1 Right,Anterior Knee o Change dressing every day. o Other: - as needed Follow-up Appointments Wound #1 Right,Anterior Knee o Return Appointment in 1 week. Edema Control Wound #1 Right,Anterior Knee o Elevate legs to the level of the heart and pump ankles as often as possible Yoss, Courvoisier T. ([id number]) Additional Orders / Instructions Wound #1 Right,Anterior Knee o Increase protein intake. o Activity as tolerated Medications-please add to medication list. Wound #1 Right,Anterior Knee o Other: - Vitamin C, Zinc, MVI Electronic Signature(s) Signed: 12/14/2016 4:15:42 PM By: Christin Fudge MD, FACS Signed: 12/14/2016 4:27:45 PM By: Alric Quan Entered By: Alric Quan on 12/14/2016 08:58:44 Barocio, [name] ([id number]) -------------------------------------------------------------------------------- Problem List Details Patient Name: Popelka, Gemini T. Date of Service: 12/14/2016 8:45 AM Medical Record Number: [id number] Patient Account Number: [id number] Date of Birth/Sex: [birth year] (71 y.o. 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, right knee 08/14/2016 Yes L97.812 Non-pressure chronic ulcer of other part of right lower leg 08/14/2016 Yes with fat layer exposed L89.892 Pressure ulcer of other site, stage 2 08/14/2016 Yes M70.861 Other soft tissue disorders related to use, overuse and 08/14/2016 Yes pressure, right lower leg M71.061 Abscess of bursa, right knee 09/21/2016 Yes Inactive Problems Resolved Problems Electronic Signature(s) Signed: 12/14/2016 9:00:22 AM By:  Christin Fudge MD, FACS Entered By: Christin Fudge on 12/14/2016 09:00:22 Strohmeyer, [name] ([id number]) -------------------------------------------------------------------------------- Progress Note Details Patient Name: [name] T. Date of Service: 12/14/2016 8:45 AM Medical Record Number: [id number] Patient Account Number: [id number] Date of Birth/Sex: May 19, 1946 (71 y.o. Male) Treating RN: Ahmed Prima Primary Care Provider: [name] Dupre Other Clinician: Referring Provider: [name] Dupre Treating Provider/Extender: Frann Rider in Treatment: 17 Subjective Chief Complaint Information obtained from Patient Patient is at the clinic for treatment of an open pressure ulcer to the area of the right knee History of Present Illness (HPI) The following HPI elements were documented for the patient's wound: Location: right knee swelling with skin ulceration Quality: Patient reports experiencing a dull pain to affected area(s). Severity: Patient states wound are getting worse. Duration: Patient has had the wound for Timing: Pain in wound is Intermittent (comes and goes Context: The wound occurred when the patient had a cast placed over his right lower extremity and with the cast was removed he had a swelling of the knee with problems with his skin Modifying Factors: Other treatment(s) tried include:orthopedics Associated Signs and Symptoms: swelling of both lower extremities with large amount of swelling of the knee 71 year old gentleman who has been living in an independent living facility for long-term care has had recurrent falls and recently had a large hematoma on the right knee. Known to be on aspirin and Plavix for history of CABG and was seen in the ER at the end of February after a fall. past medical history of arthritis, congestive heart failure, diabetes mellitus, hypertension, status post CABG o2, status post cholecystectomy, hernia repair and kidney  surgery. recent workup for the fall showed negative clinical findings in his extremities and it was thought that he was falling due to deconditioning. a x-ray showed a right fibular neck fracture and was placed in a splint. he was asked to be seen by orthopedic surgeon. the patient cannot tell me which orthopedic  doctor had seen him and I'm not able to get any notes from [place], regarding his orthopedic care. The patient says twice during this last week he has had bloody fluid shooting out of his knee and has drained all over his boot and the flow. 08/21/2016 -- he was seen by the nurse practitioner at the facility and and distend she aspirated about 60 mL of hemorrhagic fluid from his right knee. 08/31/2016 -- since I saw the patient last he was admitted to the hospital at [facility] between 08/23/2016 2 08/27/2016 and was treated with a pulmonary embolism. he had EKOS complete, was started on Xarelto, and also treated for chronic diastolic congestive heart failure. he was asked to follow-up with the wound center and continue management locally. 09/07/2016 -- he has an appointment to see his orthopedic surgeon later this week. 09/14/2016 -- he did see the orthopedic service and they have asked him to continue with physical therapy. They discontinued the boot and the x-ray done showed a healed fracture. They said it was okay to use a Scoggin, Dhyan T. ([id number]) wound VAC if needed. Addendum -- we got the note from the Orthopedic office where he was seen by [facility] orthopedics on [address] in [place] and was seen by the PA Carlynn Spry. -- The x-ray which was done was reviewed and there is good healing of the fracture and from the orthopedic point of view he has healed well and they have asked him to continue with good control of his diabetes and management by the wound center including using a wound VAC if required. 09/21/2016 -- the patient's wound VAC had been applied 2 days  ago, but the black sponge had not been placed laterally under the skin flap and into the area of the right of the knee, where there was a large cavity. On examination, after removal of his wound VAC, there is a large collection of seropurulent fluid and hematoma in the area starting at the lateral knee and going down to his right lateral compartment of the leg. Clinically, this is a huge infected abscess cavity 10/01/2016 -- patient was admitted between 09/21/2016 and 09/23/2016 with cellulitis of the right lower extremity with a large infected hematoma which was drained at the bedside by surgical services. he was treated with Levaquin and continues to be on Xarelto. the patient is back on a wound VAC which has not been functioning due to the large depth of the hematoma and organized clots. He is still on Keflex orally. Addendum : I spoke to his surgeon Dr. Tama High and we discussed options for further draining this large hematoma which I suspect will get infected sooner than later. I expressed the fact that the wound VAC does not seem to be functioning well and competent enough to drain this large cavity. He will call for him in the office. OHY07 2018 -- the patient has hypotension in the sitting position and is a bit lethargic as compared to his normal self. I spoke personally to his nurse at the nursing homes, Ms Judson Roch and discussed his management including the fact that he had to have a surgical consultation for the right lower leg, with Dr. Tama High. Prior to the patient's discharge from the wound center he continues to have hypotension and hence I'm going to talk to the ER physician and have him evaluated in the ER. 10/15/2016 -- the patient was admitted to the hospital between 10/08/2016 and 10/12/2016. He was treated for hypokalemia,  hyponatremia, renal insufficiency, sepsis and possible abscess of the right lower leg. he was seen by surgical services and the fluid collection was  drained with a Yankauer suction from the knee wound and the opinion of the surgeons was that he would not need any surgical intervention and continue with local wound care. I had personally communicated with Dr. Tama High regarding this. the patient is still on Xarelto. During the admission he was also seen by Dr. Ola Spurr for infectious disease consult and he recommended continue vancomycin for MRSA and change meropenem to ceftriaxone for this Proteus. He recommended a four-week course of antibiotic therapy and follow-up back with Dr. Ola Spurr in 4 weeks. before his discharge since there was clinical improvement Dr. Ola Spurr converted the treatment to oral clindamycin and oral Keflex for a four-week course of antibiotic therapy. He was to be monitored for C. difficile. 10/29/2016 -- he missed his appointment with Dr. Rosana Hoes at the surgical office. He continues to be on oral antibiotics as per Dr. Ola Spurr. 11/16/16 patient's wound on the right knee continues to trouble him. He tells me that the nursing facility has not changed this dressing in almost a week and his wound is doing somewhat worse today in regard to undermining compared to last week's evaluation. He continues to be frustrated with this. Unfortunately he has tried to switch facilities but was not able to get into any other so far. 11/30/16 Patient's wound appears to be doing well on evaluation today. Fortunately he is tolerating the Cocker, Victorhugo T. ([id number]) Iodoform packing which it does sound the facility is performing appropriately. Nonetheless he still continues to have some discomfort there is no evidence of infection today. 12/07/16 on evaluation today patient appears to be doing fairly well in regard to his wound externally although it is somewhat deeper than on previous evaluation. Obviously this seems to be a result of over packing based on what we are seeing here in the office today. I'm happy they are packing  the wound but at the same time we meet need to be sure not to overpack which can worsen the depth which appears to be what happened. Fortunately there's no evidence of infection. Objective Constitutional Pulse regular. Respirations normal and unlabored. Afebrile. Vitals Time Taken: 8:39 AM, Height: 67 in, Weight: 357 lbs, BMI: 55.9, Temperature: 98.5 F, Pulse: 71 bpm, Respiratory Rate: 16 breaths/min, Blood Pressure: 95/62 mmHg. Eyes Nonicteric. Reactive to light. Ears, Nose, Mouth, and Throat Lips, teeth, and gums WNL.[name] Moist mucosa without lesions. Neck supple and nontender. No palpable supraclavicular or cervical adenopathy. Normal sized without goiter. Respiratory WNL. No retractions.. Breath sounds WNL, No rubs, rales, rhonchi, or wheeze.. Cardiovascular Heart rhythm and rate regular, no murmur or gallop.. Pedal Pulses WNL. No clubbing, cyanosis or edema. Chest Breasts symmetical and no nipple discharge.. Breast tissue WNL, no masses, lumps, or tenderness.. Lymphatic No adneopathy. No adenopathy. No adenopathy. Musculoskeletal Adexa without tenderness or enlargement.. Digits and nails w/o clubbing, cyanosis, infection, petechiae, ischemia, or inflammatory conditions.[name] COLM, LYFORD ([id number]) Psychiatric Judgement and insight Intact.. No evidence of depression, anxiety, or agitation.. General Notes: most of the wound is completely healed except the lateral part where there is undermining and tunneling minimally. There is no evidence of cellulitis. Integumentary (Hair, Skin) No suspicious lesions. No crepitus or fluctuance. No peri-wound warmth or erythema. No masses.. Wound #1 status is Open. Original cause of wound was Pressure Injury. The wound is located on the Right,Anterior Knee. The wound measures 0.2cm length x  0.6cm width x 0.2cm depth; 0.094cm^2 area and 0.019cm^3 volume. There is Fat Layer (Subcutaneous Tissue) Exposed exposed. There is no tunneling noted,  however, there is undermining starting at 6:00 and ending at :00 with a maximum distance of 4.6cm. There is a large amount of serosanguineous drainage noted. The wound margin is flat and intact. There is large (67-100%) red granulation within the wound bed. There is a small (1-33%) amount of necrotic tissue within the wound bed including Eschar and Adherent Slough. The periwound skin appearance exhibited: Erythema. The periwound skin appearance did not exhibit: Callus, Crepitus, Excoriation, Induration, Rash, Scarring, Dry/Scaly, Maceration, Atrophie Blanche, Cyanosis, Ecchymosis, Hemosiderin Staining, Mottled, Pallor, Rubor. The surrounding wound skin color is noted with erythema which is circumferential. Periwound temperature was noted as No Abnormality. The periwound has tenderness on palpation. 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 Cleanse wound with mild soap and water Anesthetic: YACINE, GARRIGA. ([id number]) Wound #1 Right,Anterior Knee: Topical Lidocaine 4% cream applied to wound bed prior to debridement - Only in the Wound Clinic Primary Wound Dressing: Wound #1 Right,Anterior Knee: Iodoform packing Gauze - pack lightly in wound (ONLY ONE SINGLE LAYER) (PACK ONLY AT 4.4 CM ONLY AND LEAVE A TAIL TO REMOVE) PLEASE FOLLOW THESE ORDERS Secondary Dressing: Wound #1 Right,Anterior Knee: ABD pad Dry Gauze Conform/Kerlix Other - tape Dressing Change Frequency: Wound #1 Right,Anterior Knee: Change dressing every day. Other: - as  needed Follow-up Appointments: Wound #1 Right,Anterior Knee: Return Appointment in 1 week. Edema Control: Wound #1 Right,Anterior Knee: Elevate legs to the level of the heart and pump ankles as often as possible Additional Orders / Instructions: Wound #1 Right,Anterior Knee: Increase protein intake. Activity as tolerated Medications-please add to medication list.: Wound #1 Right,Anterior Knee: Other: - Vitamin C, Zinc, MVI we are having problems with his nursing care failing to do the packing appropriately. After careful examination of the patient's right lower extremity, I have recommended: 1. Packing the wound at the knee with 1/4 inch iodoform gauze to be changed daily and when necessary. 2. Continue with physiotherapy and other rehabilitation 3. He has regularly at the wound center Electronic Signature(s) Signed: 12/14/2016 9:02:29 AM By: Christin Fudge MD, FACS Entered By: Christin Fudge on 12/14/2016 09:02:29 Caudell, [name] ([id number]) -------------------------------------------------------------------------------- SuperBill Details Patient Name: [name] T. Date of Service: 12/14/2016 Medical Record Number: [id number] Patient Account Number: [id number] Date of Birth/Sex: [birth year] (71 y.o. 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 L89.892 Pressure ulcer of other site, stage 2 M70.861 Other soft tissue disorders related to use, overuse and pressure, right lower leg M71.061 Abscess of bursa, right knee Facility Procedures CPT4 Code: 32440102 Description: 72536 - WOUND CARE  VISIT-LEV 3 EST PT Modifier: Quantity: 1 Physician Procedures CPT4: Description Modifier Quantity Code [id number] -  WC PHYS LEVEL 3 - EST PT 1 ICD-10 Description Diagnosis E11.622 Type 2 diabetes mellitus with other skin ulcer L97.812 Non-pressure chronic ulcer of other part of right lower leg with fat layer  exposed M70.861 Other soft tissue disorders related to use, overuse and pressure, right lower leg Electronic Signature(s) Signed: 12/14/2016 4:15:42 PM By: Christin Fudge MD, FACS Signed: 12/14/2016 4:27:45 PM By: Alric Quan Previous Signature: 12/14/2016 9:02:53 AM Version By: Christin Fudge MD, FACS Entered By: Alric Quan on 12/14/2016 09:25:40

## 2016-12-15 NOTE — ED Provider Notes (Signed)
[facility] Emergency Department Provider Note  Time seen: 4:53 PM  I have reviewed the triage vital signs and the nursing notes.   HISTORY  Chief Complaint Chest Pain    HPI [name] is a 71 y.o. male with a significant past medical history who presents to the emergency department for chest pain.According to the patient today at his nursing facility he developed central chest pain. Patient states he gets chest pain every day since his bypass in 2003. He states the chest pain today was worse so he came to the emergency department for evaluation. Currently states mild to moderate mid chest pain. Denies any trouble breathing, nausea or vomiting. Denies any abdominal pain. Patient states he was recently discharged from the hospital for chest pain.  Past Medical History:  Diagnosis Date  . Abscess 10/08/2016  . Abscess of right leg excluding foot   . Acute on chronic renal failure ([place]) 08/23/2016  . Acute on chronic respiratory failure ([place]) 08/23/2016  . AKI (acute kidney injury) ([place]) 08/27/2016  . Arthritis   . Asthma   . Bipolar 1 disorder ([place]) 07/23/2016   Last Assessment & Plan:  On multiple meds including geodon and seems to be stable.    [name] CAD (coronary artery disease) 04/27/2016  . Cellulitis 09/21/2016  . Chest pain, rule out acute myocardial infarction 02/16/2016  . CHF (congestive heart failure) ([place])   . Chronic bronchitis ([place]) 07/23/2016   Last Assessment & Plan:  Breathing at baseline now with some chronic sob.   . Current use of proton pump inhibitor 08/23/2016  . Demand ischemia ([place]) 08/27/2016  . Diabetes ([place])   . DVT (deep venous thrombosis) ([place]) 08/27/2016  . Electrolyte imbalance 08/23/2016  . Elevated troponin 08/27/2016  . Heart trouble   . History of asthma 08/23/2016  . History of bipolar disorder 08/23/2016  . History of chronic pain 08/23/2016  . History of gastric ulcer 08/23/2016  . History of stomach ulcers   . History of stroke 07/23/2016    Last Assessment & Plan:  On asa/plavix so won't increase asa for now re dvt prevention  . HTN (hypertension) 04/27/2016  . Hypertension   . Lactic acidosis 08/23/2016  . 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 Obesity, unspecified 11/22/2013  . OSA (obstructive sleep apnea) 08/23/2016  . Pulmonary embolism ([place]) 08/23/2016  . Reactive airway disease 11/22/2013  . S/P CABG x 2   . SIRS (systemic inflammatory response syndrome) ([place]) 08/23/2016  . Skin cancer   . Stroke [facility])     Patient Active Problem List   Diagnosis Date Noted  . Abscess 10/08/2016  . Abscess of right leg excluding foot   . Cellulitis 09/21/2016  . AKI (acute kidney injury) ([place]) 08/27/2016  . Demand ischemia ([place]) 08/27/2016  . Elevated troponin 08/27/2016  . 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 DVT (deep venous thrombosis) ([place]) 08/27/2016  . Pressure injury of skin 08/24/2016  . Pulmonary embolism ([place]) 08/23/2016  . SIRS (systemic inflammatory response syndrome) ([place]) 08/23/2016  . Acute on chronic renal failure ([place]) 08/23/2016  . Acute on chronic respiratory failure ([place]) 08/23/2016  . Lactic acidosis 08/23/2016  . Electrolyte imbalance 08/23/2016  . Current use of proton pump inhibitor 08/23/2016  . OSA (obstructive sleep apnea) 08/23/2016  . History of bipolar disorder 08/23/2016  . History of gastric ulcer 08/23/2016  . History of asthma 08/23/2016  . History of chronic pain 08/23/2016  . Acute pulmonary embolism ([place]) 08/23/2016  . Age-related  osteoporosis with current pathological fracture with routine healing 07/23/2016  . Bipolar 1 disorder ([place]) 07/23/2016  . Chronic bronchitis ([place]) 07/23/2016  . History of stroke 07/23/2016  . Chest pain 07/14/2016  . UTI (urinary tract infection) 04/27/2016  . CAD (coronary artery disease) 04/27/2016  . Diabetes ([place]) 04/27/2016  . HTN (hypertension) 04/27/2016  . Chronic diastolic CHF (congestive heart failure) ([place]) 04/27/2016  . CKD (chronic kidney disease), stage III 04/27/2016  .  Chest pain, rule out acute myocardial infarction 02/16/2016  . Obesity, unspecified 11/22/2013  . Reactive airway disease 11/22/2013    Past Surgical History:  Procedure Laterality Date  . CARDIAC SURGERY    . CHOLECYSTECTOMY    . GALLBLADDER SURGERY    . HEART BYPASS    . HERNIA REPAIR    . IR ANGIOGRAM PULMONARY BILATERAL SELECTIVE  08/23/2016  . IR ANGIOGRAM SELECTIVE EACH ADDITIONAL VESSEL  08/23/2016  . IR ANGIOGRAM SELECTIVE EACH ADDITIONAL VESSEL  08/23/2016  . IR INFUSION THROMBOL ARTERIAL INITIAL (MS)  08/23/2016  . IR INFUSION THROMBOL ARTERIAL INITIAL (MS)  08/23/2016  . IR THROMB F/U EVAL ART/VEN FINAL DAY (MS)  08/24/2016  . IR US GUIDE VASC ACCESS RIGHT  08/23/2016  . KIDNEY SURGERY      Prior to Admission medications   Medication Sig Start Date End Date Taking? Authorizing Provider  acetaminophen (TYLENOL) 325 MG tablet Take 650 mg by mouth every 4 (four) hours as needed (general discomfort).    [provider]  albuterol (PROAIR HFA) 108 (90 Base) MCG/ACT inhaler Inhale 2 puffs into the lungs every 4 (four) hours as needed for wheezing or shortness of breath.    [provider]  amLODipine (NORVASC) 10 MG tablet Take 10 mg by mouth daily.  05/31/13   [provider]  aspirin 81 MG EC tablet Chew 81 mg by mouth daily.    [provider]  buPROPion (WELLBUTRIN SR) 100 MG 12 hr tablet Take 100 mg by mouth daily.  05/31/13   [provider]  colchicine 0.6 MG tablet Take 0.6 mg by mouth daily.    [provider]  doxepin (SINEQUAN) 25 MG capsule Take 25 mg by mouth at bedtime.     [provider]  febuxostat (ULORIC) 40 MG tablet Take 40 mg by mouth daily.    [provider]  fluticasone (FLONASE) 50 MCG/ACT nasal spray Place 1 spray into both nostrils daily.    [provider]  Fluticasone-Salmeterol (ADVAIR) 100-50 MCG/DOSE AEPB Inhale 1 puff into the lungs 2 (two) times daily.    [provider]   furosemide (LASIX) 40 MG tablet Take 40 mg by mouth 2 (two) times daily.    [provider]  ipratropium-albuterol (DUONEB) 0.5-2.5 (3) MG/3ML SOLN Inhale 3 mLs into the lungs every 4 (four) hours as needed (shortness of breath/wheezing).  05/01/16   [provider]  iron polysaccharides (NIFEREX) 150 MG capsule Take 150 mg by mouth daily.    [provider]  levothyroxine (SYNTHROID, LEVOTHROID) 100 MCG tablet Take 100 mcg by mouth daily before breakfast.  05/31/13   [provider]  meclizine (ANTIVERT) 25 MG tablet Take 25 mg by mouth 2 (two) times daily as needed for dizziness.  05/31/13   [provider]  metolazone (ZAROXOLYN) 5 MG tablet Take 5 mg by mouth daily.    [provider]  metoprolol (LOPRESSOR) 50 MG tablet Take 50 mg by mouth 2 (two) times daily.  05/31/13  [provider]  montelukast (SINGULAIR) 10 MG tablet Take 1 tablet by mouth at bedtime.     [provider]  pantoprazole (PROTONIX) 40 MG tablet Take 40 mg by mouth daily.     [provider]  potassium chloride SA (K-DUR,KLOR-CON) 20 MEQ tablet Take 40 mEq by mouth daily.     [provider]  rivaroxaban (XARELTO) 20 MG TABS tablet Take 1 tablet (20 mg total) by mouth daily with supper. 09/23/16   Bettey Costa, MD  senna-docusate (SENOKOT-S) 8.6-50 MG tablet Take 2 tablets by mouth daily.    [provider]  Suvorexant (BELSOMRA) 10 MG TABS Take 10 mg by mouth at bedtime.    [provider]  ziprasidone (GEODON) 80 MG capsule Take 80 mg by mouth 2 (two) times daily with a meal.    [provider]    Allergies  Allergen Reactions  . Dilaudid [Hydromorphone Hcl] Other (See Comments)    "CANNOT HEAR, CANNOT THINK"    . Hydrocodone-Acetaminophen Nausea And Vomiting  . Hydromorphone Nausea Only    Vomiting, diarrhea.  . Morphine And Related Other (See Comments)    Patient was told by a doctor that this "would  kill" him  . Tetracycline Other (See Comments)    Patient was told by a doctor that this "would kill" him  . Tetracyclines & Related Other (See Comments)    Patient doesn't recall the reaction  . Penicillins Rash    Has patient had a PCN reaction causing immediate rash, facial/tongue/throat swelling, SOB or lightheadedness with hypotension: Yes Has patient had a PCN reaction causing severe rash involving mucus membranes or skin necrosis: No Has patient had a PCN reaction that required hospitalization No Has patient had a PCN reaction occurring within the last 10 years: No If all of the above answers are "NO", then may proceed with Cephalosporin use.   . Sulfa Antibiotics Rash    Family History  Problem Relation Age of Onset  . Diabetes Mother   . Heart attack Father   . Diabetes Brother   . Heart attack Brother     Social History Social History  Substance Use Topics  . Smoking status: Current Some Day Smoker    Packs/day: 0.25    Types: Cigarettes    Last attempt to quit: 03/15/1963  . Smokeless tobacco: Never Used  . Alcohol use No    Review of Systems Constitutional: Negative for fever. Cardiovascular: Positive for chest pain Respiratory: Negative for shortness of breath. Gastrointestinal: Negative for abdominal pain, vomiting and diarrhea. Musculoskeletal: Negative for leg pain Neurological: Negative for headache All other ROS negative  ____________________________________________   PHYSICAL EXAM:  VITAL SIGNS: ED Triage Vitals  Enc Vitals Group     BP 12/15/16 1623 (!) 111/53     Pulse Rate 12/15/16 1623 82     Resp 12/15/16 1623 20     Temp 12/15/16 1623 98.6 F (37 C)     Temp Source 12/15/16 1623 Oral     SpO2 12/15/16 1623 95 %     Weight 12/15/16 1617 (!) 320 lb (145.2 kg)     Height 12/15/16 1617 5\' 7"  (1.702 m)     Head Circumference --      Peak Flow --      Pain Score 12/15/16 1617 8     Pain Loc --      Pain Edu? --      Excl. in [place]? --  Constitutional: Alert. Obese. Chronically ill appearing. Nontoxic. Eyes: Normal exam ENT   Head: Normocephalic and atraumatic   Mouth/Throat: Mucous membranes are moist. Cardiovascular: Normal rate, regular rhythm.  Respiratory: Normal respiratory effort without tachypnea nor retractions. Breath sounds are clear  Gastrointestinal: Soft and nontender. No distention.   Musculoskeletal: Nontender with normal range of motion in all extremities. Neurologic:  Normal speech and language. No gross focal neurologic deficits Skin:  Skin is warm, dry and intact.  Psychiatric: Mood and affect are normal.   ____________________________________________    EKG  EKG reviewed and interpreted by myself shows normal sinus rhythm at 80 bpm with a narrow QRS, left axis deviation, normal intervals besides PR prolongation, nonspecific ST changes without elevation.  ____________________________________________    RADIOLOGY  Chest x-ray is negative  ____________________________________________   INITIAL IMPRESSION / ASSESSMENT AND PLAN / ED COURSE  Pertinent labs & imaging results that were available during my care of the patient were reviewed by me and considered in my medical decision making (see chart for details).  Patient presents to the emergency department for chest pain. Patient states a frequent history of chest pain on a daily basis. Patient was last discharged 12/11/16 for chest pain with an elevated troponin. At that time his cardiologist Dr. Carren Rang was consult to who states the patient had a recent nuclear stress test and no further intervention was planned at this time. We will check labs, x-ray, continue to closely monitor the patient in the emergency department. Patient states he took nitroglycerin at home without any relief of chest pain we will dose Percocet in the emergency department.  Patient's labs have resulted largely at baseline. Patient has chronic renal insufficiency  close to baseline. Troponin 0.06 which is largely unchanged from recent 0.08. We will repeat a second troponin. Patient's repeat troponin is unchanged, as the patient has cardiology follow-up and recent negative admission for chest pain and believe the patient is safe for discharge home with cardiology follow-up.  Patient's troponin is largely unchanged. Patient states the chest pain is gone after his pain medication in the emergency department. We will discharge home with cardiology follow-up.  ____________________________________________   FINAL CLINICAL IMPRESSION(S) / ED DIAGNOSES  Chest pain    Harvest Dark, MD 12/15/16 2019

## 2016-12-21 ENCOUNTER — Ambulatory Visit: Payer: Medicare Other | Admitting: Surgery

## 2016-12-24 ENCOUNTER — Encounter: Payer: Medicare Other | Attending: Surgery | Admitting: Surgery

## 2016-12-24 DIAGNOSIS — Z951 Presence of aortocoronary bypass graft: Secondary | ICD-10-CM | POA: Insufficient documentation

## 2016-12-24 DIAGNOSIS — M71061 Abscess of bursa, right knee: Secondary | ICD-10-CM | POA: Insufficient documentation

## 2016-12-24 DIAGNOSIS — Z7902 Long term (current) use of antithrombotics/antiplatelets: Secondary | ICD-10-CM | POA: Insufficient documentation

## 2016-12-24 DIAGNOSIS — I110 Hypertensive heart disease with heart failure: Secondary | ICD-10-CM | POA: Diagnosis not present

## 2016-12-24 DIAGNOSIS — I509 Heart failure, unspecified: Secondary | ICD-10-CM | POA: Diagnosis not present

## 2016-12-24 DIAGNOSIS — L89892 Pressure ulcer of other site, stage 2: Secondary | ICD-10-CM | POA: Insufficient documentation

## 2016-12-24 DIAGNOSIS — M12561 Traumatic arthropathy, right knee: Secondary | ICD-10-CM | POA: Insufficient documentation

## 2016-12-24 DIAGNOSIS — E11622 Type 2 diabetes mellitus with other skin ulcer: Secondary | ICD-10-CM | POA: Diagnosis not present

## 2016-12-24 DIAGNOSIS — L97812 Non-pressure chronic ulcer of other part of right lower leg with fat layer exposed: Secondary | ICD-10-CM | POA: Diagnosis not present

## 2016-12-24 DIAGNOSIS — Z7982 Long term (current) use of aspirin: Secondary | ICD-10-CM | POA: Diagnosis not present

## 2016-12-24 DIAGNOSIS — Z9181 History of falling: Secondary | ICD-10-CM | POA: Insufficient documentation

## 2016-12-26 NOTE — Progress Notes (Signed)
[name] [name], [name] [name] ([id number]) Visit Report for 12/24/2016 Arrival Information Details Patient Name: [name], [name]. Date of Service: 12/24/2016 8:45 AM Medical Record Number: [id number] Patient Account Number: [id number] Date of Birth/Sex: November 10, 1945 (71 y.o. Male) Treating RN: [name] [name] Primary Care [name] [name]: [name] [name] Other Clinician: Referring [name] [name]: [name] [name] Treating [name] [name]/Extender: [name] [name] in Treatment: 18 Visit Information History Since Last Visit All ordered tests and consults were completed: No Patient Arrived: Wheel Chair Added or deleted any medications: No Arrival Time: 08:37 Any new allergies or adverse reactions: No Accompanied By: self Had a fall or experienced change in No Transfer Assistance: EasyPivot activities of daily living that may affect Patient Lift risk of falls: Patient Identification Verified: Yes Signs or symptoms of abuse/neglect since last No Secondary Verification Process Yes visito Completed: Hospitalized since last visit: No Patient Requires Transmission- No Has Dressing in Place as Prescribed: Yes Based Precautions: Pain Present Now: No Patient Has Alerts: No Electronic Signature(s) Signed: 12/24/2016 4:42:53 PM By: Alric Quan Entered By: Alric Quan on 12/24/2016 08:39:01 [name], [name] [name] ([id number]) -------------------------------------------------------------------------------- Clinic Level of Care Assessment Details Patient Name: [name] [name]. Date of Service: 12/24/2016 8:45 AM Medical Record Number: [id number] Patient Account Number: [id number] Date of Birth/Sex: [birth year] (71 y.o. Male) Treating RN: [name] [name] Primary Care [name] [name]: [name] [name] Other Clinician: Referring [name] [name]: [name] [name] Treating [name] [name]/Extender: [name] [name] in Treatment: 18 Clinic Level of Care Assessment Items TOOL 4 Quantity Score X - Use when only an EandM is performed on  FOLLOW-UP visit 1 0 ASSESSMENTS - Nursing Assessment / Reassessment X - Reassessment of Co-morbidities (includes updates in patient status) 1 10 X - Reassessment of Adherence to Treatment Plan 1 5 ASSESSMENTS - Wound and Skin Assessment / Reassessment X - Simple Wound Assessment / Reassessment - one wound 1 5 []  - Complex Wound Assessment / Reassessment - multiple wounds 0 []  - Dermatologic / Skin Assessment (not related to wound area) 0 ASSESSMENTS - Focused Assessment []  - Circumferential Edema Measurements - multi extremities 0 []  - Nutritional Assessment / Counseling / Intervention 0 []  - Lower Extremity Assessment (monofilament, tuning fork, pulses) 0 []  - Peripheral Arterial Disease Assessment (using hand held doppler) 0 ASSESSMENTS - Ostomy and/or Continence Assessment and Care []  - Incontinence Assessment and Management 0 []  - Ostomy Care Assessment and Management (repouching, etc.) 0 PROCESS - Coordination of Care []  - Simple Patient / Family Education for ongoing care 0 X - Complex (extensive) Patient / Family Education for ongoing care 1 20 X - Staff obtains Programmer, systems, Records, Test Results / Process Orders 1 10 X - Staff telephones HHA, Nursing Homes / Clarify orders / etc 1 10 []  - Routine Transfer to another Facility (non-emergent condition) 0 [name], [name] ([id number]) []  - Routine Hospital Admission (non-emergent condition) 0 []  - New Admissions / Biomedical engineer / Ordering NPWT, Apligraf, etc. 0 []  - Emergency Hospital Admission (emergent condition) 0 X - Simple Discharge Coordination 1 10 []  - Complex (extensive) Discharge Coordination 0 PROCESS - Special Needs []  - Pediatric / Minor Patient Management 0 []  - Isolation Patient Management 0 []  - Hearing / Language / Visual special needs 0 []  - Assessment of Community assistance (transportation, D/C planning, etc.) 0 []  - Additional assistance / Altered mentation 0 []  - Support Surface(s) Assessment (bed,  cushion, seat, etc.) 0 INTERVENTIONS - Wound Cleansing / Measurement X - Simple Wound Cleansing - one wound 1 5 []  - Complex Wound Cleansing - multiple wounds 0  X - Wound Imaging (photographs - any number of wounds) 1 5 []  - Wound Tracing (instead of photographs) 0 X - Simple Wound Measurement - one wound 1 5 []  - Complex Wound Measurement - multiple wounds 0 INTERVENTIONS - Wound Dressings X - Small Wound Dressing one or multiple wounds 1 10 []  - Medium Wound Dressing one or multiple wounds 0 []  - Large Wound Dressing one or multiple wounds 0 X - Application of Medications - topical 1 5 []  - Application of Medications - injection 0 INTERVENTIONS - Miscellaneous []  - External ear exam 0 [name], [name] T. ([id number]) []  - Specimen Collection (cultures, biopsies, blood, body fluids, etc.) 0 []  - Specimen(s) / Culture(s) sent or taken to Lab for analysis 0 []  - Patient Transfer (multiple staff / [name] [name] Lift / Similar devices) 0 []  - Simple Staple / Suture removal (25 or less) 0 []  - Complex Staple / Suture removal (26 or more) 0 []  - Hypo / Hyperglycemic Management (close monitor of Blood Glucose) 0 []  - Ankle / Brachial Index (ABI) - do not check if billed separately 0 X - Vital Signs 1 5 Has the patient been seen at the hospital within the last three years: Yes Total Score: 105 Level Of Care: New/Established - Level 3 Electronic Signature(s) Signed: 12/24/2016 4:42:53 PM By: Alric Quan Entered By: Alric Quan on 12/24/2016 09:30:33 [name], [name] [name] ([id number]) -------------------------------------------------------------------------------- Encounter Discharge Information Details Patient Name: [name] Gathers T. Date of Service: 12/24/2016 8:45 AM Medical Record Number: [id number] Patient Account Number: [id number] Date of Birth/Sex: February 18, 1946 (71 y.o. Male) Treating RN: [name] [name] Primary Care Hermione Havlicek: [name] [name] Other Clinician: Referring Melainie Krinsky:  [name] [name] Treating Selig Wampole/Extender: [name] [name] in Treatment: 39 Encounter Discharge Information Items Discharge Pain Level: 0 Discharge Condition: Stable Ambulatory Status: Wheelchair Discharge Destination: Nursing Home Transportation: Other Accompanied By: self Schedule Follow-up Appointment: Yes Medication Reconciliation completed No and provided to Patient/Care Rainn Bullinger: Provided on Clinical Summary of Care: 12/24/2016 Form Type Recipient Paper Patient JE Electronic Signature(s) Signed: 12/24/2016 9:17:36 AM By: Ruthine Dose Entered By: Ruthine Dose on 12/24/2016 09:17:36 Pohlman, [name] [name] ([id number]) -------------------------------------------------------------------------------- Lower Extremity Assessment Details Patient Name: [name] Gathers T. Date of Service: 12/24/2016 8:45 AM Medical Record Number: [id number] Patient Account Number: [id number] Date of Birth/Sex: Sep 25, 1945 (71 y.o. Male) Treating RN: [name] [name] Primary Care Hennessy Bartel: [name] [name] Other Clinician: Referring Donneisha Beane: [name] [name] Treating Killian Schwer/Extender: [name] [name] in Treatment: 40 Vascular Assessment Pulses: Dorsalis Pedis Palpable: [Right:Yes] Posterior Tibial Extremity colors, hair growth, and conditions: Extremity Color: [Right:Hyperpigmented] Temperature of Extremity: [Right:Warm] Capillary Refill: [Right:> 3 seconds] Electronic Signature(s) Signed: 12/24/2016 4:42:53 PM By: Alric Quan Entered By: Alric Quan on 12/24/2016 08:53:06 Werling, [name] [name] ([id number]) -------------------------------------------------------------------------------- Multi Wound Chart Details Patient Name: [name] Gathers T. Date of Service: 12/24/2016 8:45 AM Medical Record Number: [id number] Patient Account Number: [id number] Date of Birth/Sex: November 27, 1945 (71 y.o. Male) Treating RN: [name] [name] Primary Care Telly Jawad: [name] [name] Other  Clinician: Referring Dmonte Maher: [name] [name] Treating Nedra Mcinnis/Extender: [name] [name] in Treatment: 18 Vital Signs Height(in): 67 Pulse(bpm): 72 Weight(lbs): 357 Blood Pressure 108/57 (mmHg): Body Mass Index(BMI): 56 Temperature(F): 97.8 Respiratory Rate 16 (breaths/min): Photos: [1:No Photos] [N/A:N/A] Wound Location: [1:Right Knee - Anterior] [N/A:N/A] Wounding Event: [1:Pressure Injury] [N/A:N/A] Primary Etiology: [1:Diabetic Wound/Ulcer of the Lower Extremity] [N/A:N/A] Comorbid History: [1:Anemia, Asthma, Arrhythmia, Congestive Heart Failure, Coronary Artery Disease, Hypertension, Type II Diabetes, Osteoarthritis] [N/A:N/A] Date Acquired: [1:07/23/2016] [N/A:N/A] Weeks of Treatment: [1:18] [N/A:N/A] Wound Status: [1:Open] [N/A:N/A] Measurements L x  W x D 0.6x1x0.2 [N/A:N/A] (cm) Area (cm) : [1:0.471] [N/A:N/A] Volume (cm) : [1:0.094] [N/A:N/A] % Reduction in Area: [1:94.00%] [N/A:N/A] % Reduction in Volume: 88.00% [N/A:N/A] Starting Position 1 6 (o'clock): Ending Position 1 (o'clock): Maximum Distance 1 1.4 (cm): Undermining: [1:Yes] [N/A:N/A] Classification: [1:Grade 1] [N/A:N/A] Exudate Amount: [1:Large] [N/A:N/A] Exudate Type: [1:Serosanguineous] [N/A:N/A] Exudate Color: red, brown N/A N/A Wound Margin: Flat and Intact N/A N/A Granulation Amount: Large (67-100%) N/A N/A Granulation Quality: Red N/A N/A Necrotic Amount: Small (1-33%) N/A N/A Exposed Structures: Fat Layer (Subcutaneous N/A N/A Tissue) Exposed: Yes Fascia: No Tendon: No Muscle: No Joint: No Bone: No Epithelialization: None N/A N/A Periwound Skin Texture: Excoriation: No N/A N/A Induration: No Callus: No Crepitus: No Rash: No Scarring: No Periwound Skin Maceration: No N/A N/A Moisture: Dry/Scaly: No Periwound Skin Color: Erythema: Yes N/A N/A Atrophie Blanche: No Cyanosis: No Ecchymosis: No Hemosiderin Staining: No Mottled: No Pallor: No Rubor: No Erythema  Location: Circumferential N/A N/A Temperature: No Abnormality N/A N/A Tenderness on Yes N/A N/A Palpation: Wound Preparation: Ulcer Cleansing: N/A N/A Rinsed/Irrigated with Saline Topical Anesthetic Applied: Other: Lidocaine 4% Treatment Notes Wound #1 (Right, Anterior Knee) 1. Cleansed with: Clean wound with Normal Saline 2. Anesthetic Topical Lidocaine 4% cream to wound bed prior to debridement 4. Dressing Applied: Iodoform packing Gauze [name], [name] T. ([id number]) 5. Secondary Dressing Applied ABD Pad Kerlix/Conform 7. Secured with Recruitment consultant) Signed: 12/24/2016 9:15:43 AM By: Christin Fudge MD, FACS Entered By: Christin Fudge on 12/24/2016 09:15:43 Banet, [name] [name] ([id number]) -------------------------------------------------------------------------------- [place] Details Patient Name: [name] Gathers T. Date of Service: 12/24/2016 8:45 AM Medical Record Number: [id number] Patient Account Number: [id number] Date of Birth/Sex: 08/27/1945 (71 y.o. Male) Treating RN: Carolyne Fiscal, Debi Primary Care Yvaine Jankowiak: [name] [name] Other Clinician: Referring Deijah Spikes: [name] [name] Treating Avraham Benish/Extender: [name] [name] in Treatment: 40 Active Inactive ` Orientation to the Wound Care Program Nursing Diagnoses: Knowledge deficit related to the wound healing center program Goals: Patient/caregiver will verbalize understanding of the [place] Program Date Initiated: 08/14/2016 Target Resolution Date: 12/14/2016 Goal Status: Active Interventions: Provide education on orientation to the wound center Notes: ` Pressure Nursing Diagnoses: Knowledge deficit related to causes and risk factors for pressure ulcer development Knowledge deficit related to management of pressures ulcers Potential for impaired tissue integrity related to pressure, friction, moisture, and shear Goals: Patient will remain free from development  of additional pressure ulcers Date Initiated: 08/14/2016 Target Resolution Date: 12/14/2016 Goal Status: Active Patient will remain free of pressure ulcers Date Initiated: 08/14/2016 Target Resolution Date: 12/14/2016 Goal Status: Active Patient/caregiver will verbalize risk factors for pressure ulcer development Date Initiated: 08/14/2016 Target Resolution Date: 12/14/2016 Goal Status: Active Patient/caregiver will verbalize understanding of pressure ulcer management Date Initiated: 08/14/2016 Target Resolution Date: 12/14/2016 Goal Status: Active [name], [name] ([id number]) Interventions: Assess: immobility, friction, shearing, incontinence upon admission and as needed Assess offloading mechanisms upon admission and as needed Assess potential for pressure ulcer upon admission and as needed Provide education on pressure ulcers Treatment Activities: Patient referred for seating evaluation to ensure proper offloading : 08/14/2016 Pressure reduction/relief device ordered : 08/14/2016 Notes: ` Wound/Skin Impairment Nursing Diagnoses: Impaired tissue integrity Knowledge deficit related to ulceration/compromised skin integrity Goals: Patient/caregiver will verbalize understanding of skin care regimen Date Initiated: 08/14/2016 Target Resolution Date: 12/14/2016 Goal Status: Active Ulcer/skin breakdown will have a volume reduction of 30% by week 4 Date Initiated: 08/14/2016 Target Resolution Date: 12/14/2016 Goal Status: Active Ulcer/skin breakdown will have a volume reduction  of 50% by week 8 Date Initiated: 08/14/2016 Target Resolution Date: 12/14/2016 Goal Status: Active Ulcer/skin breakdown will have a volume reduction of 80% by week 12 Date Initiated: 08/14/2016 Target Resolution Date: 12/14/2016 Goal Status: Active Ulcer/skin breakdown will heal within 14 weeks Date Initiated: 08/14/2016 Target Resolution Date: 12/14/2016 Goal Status: Active Interventions: Assess patient/caregiver  ability to obtain necessary supplies Assess patient/caregiver ability to perform ulcer/skin care regimen upon admission and as needed Assess ulceration(s) every visit Provide education on ulcer and skin care Treatment Activities: Referred to DME Treyshawn Muldrew for dressing supplies : 08/14/2016 Skin care regimen initiated : 08/14/2016 [name], [name] ([id number]) Topical wound management initiated : 08/14/2016 Notes: Electronic Signature(s) Signed: 12/24/2016 4:42:53 PM By: Alric Quan Entered By: Alric Quan on 12/24/2016 08:53:15 [name], [name] Darene Lamer ([id number]) -------------------------------------------------------------------------------- Pain Assessment Details Patient Name: [name] Gathers T. Date of Service: 12/24/2016 8:45 AM Medical Record Number: [id number] Patient Account Number: [id number] Date of Birth/Sex: 1945-08-07 (71 y.o. Male) Treating RN: [name] [name] Primary Care Harrol Novello: [name] [name] Other Clinician: Referring Celestial Barnfield: [name] [name] Treating Loa Idler/Extender: [name] [name] in Treatment: 55 Active Problems Location of Pain Severity and Description of Pain Patient Has Paino No Site Locations With Dressing Change: No Pain Management and Medication Current Pain Management: Electronic Signature(s) Signed: 12/24/2016 4:42:53 PM By: Alric Quan Entered By: Alric Quan on 12/24/2016 08:39:09 Bourke, [name] [name] ([id number]) -------------------------------------------------------------------------------- Patient/Caregiver Education Details Patient Name: [name] [name]. Date of Service: 12/24/2016 8:45 AM Medical Record Number: [id number] Patient Account Number: [id number] Date of Birth/Gender: Nov 12, 1945 (71 y.o. Male) Treating RN: [name] [name] Primary Care Physician: [name] [name] Other Clinician: Referring Physician: [name] [name] Treating Physician/Extender: [name] [name] in Treatment: 16 Education  Assessment Education Provided To: Patient Education Topics Provided Wound/Skin Impairment: Handouts: Other: change dressing as ordered Methods: Demonstration, Explain/Verbal Responses: State content correctly Electronic Signature(s) Signed: 12/24/2016 4:42:53 PM By: Alric Quan Entered By: Alric Quan on 12/24/2016 09:01:38 Biggar, [name] [name] ([id number]) -------------------------------------------------------------------------------- Wound Assessment Details Patient Name: [name] Gathers T. Date of Service: 12/24/2016 8:45 AM Medical Record Number: [id number] Patient Account Number: [id number] Date of Birth/Sex: 07-07-1945 (71 y.o. Male) Treating RN: [name] [name] Primary Care Sultana Tierney: [name] [name] Other Clinician: Referring Adhvik Canady: [name] [name] Treating Matalynn Graff/Extender: [name] [name] in Treatment: 18 Wound Status Wound Number: 1 Primary Diabetic Wound/Ulcer of the Lower Etiology: Extremity Wound Location: Right Knee - Anterior Wound Open Wounding Event: Pressure Injury Status: Date Acquired: 07/23/2016 Comorbid Anemia, Asthma, Arrhythmia, Weeks Of Treatment: 18 History: Congestive Heart Failure, Coronary Clustered Wound: No Artery Disease, Hypertension, Type II Diabetes, Osteoarthritis Photos Photo Uploaded By: Alric Quan on 12/24/2016 09:33:29 Wound Measurements Length: (cm) 0.6 % Reduction in Width: (cm) 1 % Reduction in Depth: (cm) 0.2 Epithelializati Area: (cm) 0.471 Tunneling: Volume: (cm) 0.094 Undermining: Starting Pos Maximum Dist Area: 94% Volume: 88% on: None No Yes ition (o'clock): 6 ance: (cm) 1.4 Wound Description Classification: Grade 1 Foul Odor Afte Wound Margin: Flat and Intact Slough/Fibrino Exudate Amount: Large Exudate Type: Serosanguineous Exudate Color: red, brown r Cleansing: No No Wound Bed [name], [name] T. ([id number]) Granulation Amount: Large (67-100%) Exposed Structure Granulation  Quality: Red Fascia Exposed: No Necrotic Amount: Small (1-33%) Fat Layer (Subcutaneous Tissue) Exposed: Yes Necrotic Quality: Adherent Slough Tendon Exposed: No Muscle Exposed: No Joint Exposed: No Bone Exposed: No Periwound Skin Texture Texture Color No Abnormalities Noted: No No Abnormalities Noted: No Callus: No Atrophie Blanche: No Crepitus: No Cyanosis: No Excoriation: No Ecchymosis: No Induration: No Erythema: Yes Rash: No Erythema Location: Circumferential Scarring:  No Hemosiderin Staining: No Mottled: No Moisture Pallor: No No Abnormalities Noted: No Rubor: No Dry / Scaly: No Maceration: No Temperature / Pain Temperature: No Abnormality Tenderness on Palpation: Yes Wound Preparation Ulcer Cleansing: Rinsed/Irrigated with Saline Topical Anesthetic Applied: Other: Lidocaine 4%, Treatment Notes Wound #1 (Right, Anterior Knee) 1. Cleansed with: Clean wound with Normal Saline 2. Anesthetic Topical Lidocaine 4% cream to wound bed prior to debridement 4. Dressing Applied: Iodoform packing Gauze 5. Secondary Dressing Applied ABD Pad Kerlix/Conform 7. Secured with Recruitment consultant) Signed: 12/24/2016 4:42:53 PM By: Alric Quan Entered By: Alric Quan on 12/24/2016 08:52:05 Hege, [name] [name] ([id number]) Rentz, [name] [name] ([id number]) -------------------------------------------------------------------------------- Vitals Details Patient Name: [name] Gathers T. Date of Service: 12/24/2016 8:45 AM Medical Record Number: [id number] Patient Account Number: [id number] Date of Birth/Sex: Oct 05, 1945 (71 y.o. Male) Treating RN: [name] [name] Primary Care [place] Ice: [name] [name] Other Clinician: Referring Jenene Kauffmann: [name] [name] Treating Mando Blatz/Extender: [name] [name] in Treatment: 18 Vital Signs Time Taken: 08:39 Temperature (F): 97.8 Height (in): 67 Pulse (bpm): 72 Weight (lbs): 357 Respiratory Rate (breaths/min):  16 Body Mass Index (BMI): 55.9 Blood Pressure (mmHg): 108/57 Reference Range: 80 - 120 mg / dl Electronic Signature(s) Signed: 12/24/2016 4:42:53 PM By: Alric Quan Entered By: Alric Quan on 12/24/2016 08:42:32

## 2016-12-26 NOTE — Progress Notes (Signed)
SUTTER, AHLGREN ([id number]) Visit Report for 12/24/2016 Chief Complaint Document Details Patient Name: [name], [name]. Date of Service: 12/24/2016 8:45 AM Medical Record Number: [id number] Patient Account Number: [id number] Date of Birth/Sex: 03-07-1946 (71 y.o. Male) Treating RN: Ahmed Prima Primary Care Provider: [name] Dupre Other Clinician: Referring Provider: [name] Dupre Treating Provider/Extender: Frann Rider in Treatment: 28 Information Obtained from: Patient Chief Complaint Patient is at the clinic for treatment of an open pressure ulcer to the area of the right knee Electronic Signature(s) Signed: 12/24/2016 9:15:53 AM By: Christin Fudge MD, FACS Entered By: Christin Fudge on 12/24/2016 09:15:53 Jost, [name] ([id number]) -------------------------------------------------------------------------------- HPI Details Patient Name: [name] T. Date of Service: 12/24/2016 8:45 AM Medical Record Number: [id number] Patient Account Number: [id number] Date of Birth/Sex: Jan 27, 1946 (71 y.o. Male) Treating RN: Ahmed Prima Primary Care Provider: [name] Dupre Other Clinician: Referring Provider: [name] Dupre Treating Provider/Extender: Frann Rider in Treatment: 18 History of Present Illness Location: right knee swelling with skin ulceration Quality: Patient reports experiencing a dull pain to affected area(s). Severity: Patient states wound are getting worse. Duration: Patient has had the wound for <4 weeks prior to presenting for treatment Timing: Pain in wound is Intermittent (comes and goes Context: The wound occurred when the patient had a cast placed over his right lower extremity and with the cast was removed he had a swelling of the knee with problems with his skin Modifying Factors: Other treatment(s) tried include:orthopedics Associated Signs and Symptoms: swelling of both lower extremities with large amount of swelling of  the knee HPI Description: 71 year old gentleman who has been living in an independent living facility for long-term care has had recurrent falls and recently had a large hematoma on the right knee. Known to be on aspirin and Plavix for history of CABG and was seen in the ER at the end of February after a fall. past medical history of arthritis, congestive heart failure, diabetes mellitus, hypertension, status post CABG o2, status post cholecystectomy, hernia repair and kidney surgery. recent workup for the fall showed negative clinical findings in his extremities and it was thought that he was falling due to deconditioning. a x-ray showed a right fibular neck fracture and was placed in a splint. he was asked to be seen by orthopedic surgeon. the patient cannot tell me which orthopedic doctor had seen him and I'm not able to get any notes from [place], regarding his orthopedic care. The patient says twice during this last week he has had bloody fluid shooting out of his knee and has drained all over his boot and the flow. 08/21/2016 -- he was seen by the nurse practitioner at the facility and and distend she aspirated about 60 mL of hemorrhagic fluid from his right knee. 08/31/2016 -- since I saw the patient last he was admitted to the hospital at [facility] between 08/23/2016 2 08/27/2016 and was treated with a pulmonary embolism. he had EKOS complete, was started on Xarelto, and also treated for chronic diastolic congestive heart failure. he was asked to follow-up with the wound center and continue management locally. 09/07/2016 -- he has an appointment to see his orthopedic surgeon later this week. 09/14/2016 -- he did see the orthopedic service and they have asked him to continue with physical therapy. They discontinued the boot and the x-ray done showed a healed fracture. They said it was okay to use a wound VAC if needed. Addendum -- we got the note from the Orthopedic office where he was  seen by [facility] orthopedics on [facility] in [place] and was seen by the PA Carlynn Spry. -- The x-ray which was done was reviewed and there is good healing of the fracture and from the orthopedic point of view he has healed well and they have asked him to continue with good control of his diabetes and management by the wound center including using a wound VAC if required. WINDSOR, [name] ([id number]) 09/21/2016 -- the patient's wound VAC had been applied 2 days ago, but the black sponge had not been placed laterally under the skin flap and into the area of the right of the knee, where there was a large cavity. On examination, after removal of his wound VAC, there is a large collection of seropurulent fluid and hematoma in the area starting at the lateral knee and going down to his right lateral compartment of the leg. Clinically, this is a huge infected abscess cavity 10/01/2016 -- patient was admitted between 09/21/2016 and 09/23/2016 with cellulitis of the right lower extremity with a large infected hematoma which was drained at the bedside by surgical services. he was treated with Levaquin and continues to be on Xarelto. the patient is back on a wound VAC which has not been functioning due to the large depth of the hematoma and organized clots. He is still on Keflex orally. Addendum : I spoke to his surgeon Dr. Tama High and we discussed options for further draining this large hematoma which I suspect will get infected sooner than later. I expressed the fact that the wound VAC does not seem to be functioning well and competent enough to drain this large cavity. He will call for him in the office. [id number] 2018 -- the patient has hypotension in the sitting position and is a bit lethargic as compared to his normal self. I spoke personally to his nurse at the nursing homes, Ms Judson Roch and discussed his management including the fact that he had to have a surgical consultation for the  right lower leg, with Dr. Tama High. Prior to the patient's discharge from the wound center he continues to have hypotension and hence I'm going to talk to the ER physician and have him evaluated in the ER. 10/15/2016 -- the patient was admitted to the hospital between 10/08/2016 and 10/12/2016. He was treated for hypokalemia, hyponatremia, renal insufficiency, sepsis and possible abscess of the right lower leg. he was seen by surgical services and the fluid collection was drained with a Yankauer suction from the knee wound and the opinion of the surgeons was that he would not need any surgical intervention and continue with local wound care. I had personally communicated with Dr. Tama High regarding this. the patient is still on Xarelto. During the admission he was also seen by Dr. Ola Spurr for infectious disease consult and he recommended continue vancomycin for MRSA and change meropenem to ceftriaxone for this Proteus. He recommended a four-week course of antibiotic therapy and follow-up back with Dr. Ola Spurr in 4 weeks. before his discharge since there was clinical improvement Dr. Ola Spurr converted the treatment to oral clindamycin and oral Keflex for a four-week course of antibiotic therapy. He was to be monitored for C. difficile. 10/29/2016 -- he missed his appointment with Dr. Rosana Hoes at the surgical office. He continues to be on oral antibiotics as per Dr. Ola Spurr. 11/16/16 patient's wound on the right knee continues to trouble him. He tells me that the nursing facility has not changed this dressing in almost a week  and his wound is doing somewhat worse today in regard to undermining compared to last week's evaluation. He continues to be frustrated with this. Unfortunately he has tried to switch facilities but was not able to get into any other so far. 11/30/16 Patient's wound appears to be doing well on evaluation today. Fortunately he is tolerating the Iodoform packing  which it does sound the facility is performing appropriately. Nonetheless he still continues to have some discomfort there is no evidence of infection today. 12/07/16 on evaluation today patient appears to be doing fairly well in regard to his wound externally although it is somewhat deeper than on previous evaluation. Obviously this seems to be a result of over packing based on what we are seeing here in the office today. I'm happy they are packing the wound but at [place], Rashaun T. ([id number]) the same time we meet need to be sure not to overpack which can worsen the depth which appears to be what happened. Fortunately there's no evidence of infection. Electronic Signature(s) Signed: 12/24/2016 9:16:02 AM By: Christin Fudge MD, FACS Entered By: Christin Fudge on 12/24/2016 09:16:02 [name] ([id number]) -------------------------------------------------------------------------------- Physical Exam Details Patient Name: [name] T. Date of Service: 12/24/2016 8:45 AM Medical Record Number: [id number] Patient Account Number: [id number] Date of Birth/Sex: [birth year] (71 y.o. Male) Treating RN: Ahmed Prima Primary Care Provider: [name] Dupre Other Clinician: Referring Provider: [name] Dupre Treating Provider/Extender: Frann Rider in Treatment: 18 Constitutional . Pulse regular. Respirations normal and unlabored. Afebrile. . Eyes Nonicteric. Reactive to light. Ears, Nose, Mouth, and Throat Lips, teeth, and gums WNL.[name] Moist mucosa without lesions. Neck supple and nontender. No palpable supraclavicular or cervical adenopathy. Normal sized without goiter. Respiratory WNL. No retractions.. Cardiovascular Pedal Pulses WNL. No clubbing, cyanosis or edema. Lymphatic No adneopathy. No adenopathy. No adenopathy. Musculoskeletal Adexa without tenderness or enlargement.. Digits and nails w/o clubbing, cyanosis, infection, petechiae, ischemia, or inflammatory  conditions.. Integumentary (Hair, Skin) No suspicious lesions. No crepitus or fluctuance. No peri-wound warmth or erythema. No masses.[name] Psychiatric Judgement and insight Intact.. No evidence of depression, anxiety, or agitation.. Notes the tunneling on his right lateral knee is less deep and the mouth of the tunnel is healing well. There is no evidence of cellulitis. Electronic Signature(s) Signed: 12/24/2016 9:16:39 AM By: Christin Fudge MD, FACS Entered By: Christin Fudge on 12/24/2016 09:16:39 Blakely, [name] ([id number]) -------------------------------------------------------------------------------- Physician Orders Details Patient Name: [name] T. Date of Service: 12/24/2016 8:45 AM Medical Record Number: [id number] Patient Account Number: [id number] Date of Birth/Sex: [birth year] (71 y.o. Male) Treating RN: Ahmed Prima Primary Care Provider: [name] Dupre Other Clinician: Referring Provider: [name] Dupre Treating Provider/Extender: Frann Rider in Treatment: 58 Verbal / Phone Orders: Yes Clinician: Carolyne Fiscal, Debi Read Back and Verified: Yes Diagnosis Coding Wound Cleansing Wound #1 Right,Anterior Knee o Clean wound with Normal Saline. o Cleanse wound with mild soap and water Anesthetic Wound #1 Right,Anterior Knee o Topical Lidocaine 4% cream applied to wound bed prior to debridement - Only in the Wound Clinic Primary Wound Dressing Wound #1 Right,Anterior Knee o Iodoform packing Gauze - pack lightly in wound (ONLY ONE SINGLE LAYER) PLEASE FOLLOW THESE ORDERS Secondary Dressing Wound #1 Right,Anterior Knee o ABD pad o Dry Gauze o Conform/Kerlix o Other - tape Dressing Change Frequency Wound #1 Right,Anterior Knee o Change dressing every day. o Other: - as needed Follow-up Appointments Wound #1 Right,Anterior Knee o Return Appointment in 1 week. Edema Control Wound #1 Right,Anterior Knee o Elevate legs to  the  level of the heart and pump ankles as often as possible Sundell, Declyn T. ([id number]) Additional Orders / Instructions Wound #1 Right,Anterior Knee o Increase protein intake. o Activity as tolerated Medications-please add to medication list. Wound #1 Right,Anterior Knee o Other: - Vitamin C, Zinc, MVI Electronic Signature(s) Signed: 12/24/2016 4:24:31 PM By: Christin Fudge MD, FACS Signed: 12/24/2016 4:42:53 PM By: Alric Quan Entered By: Alric Quan on 12/24/2016 09:02:31 Simmers, [name] ([id number]) -------------------------------------------------------------------------------- Problem List Details Patient Name: [name], [name] Date of Service: 12/24/2016 8:45 AM Medical Record Number: [id number] Patient Account Number: [id number] Date of Birth/Sex: Apr 24, 1946 (71 y.o. 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, right knee 08/14/2016 Yes L97.812 Non-pressure chronic ulcer of other part of right lower leg 08/14/2016 Yes with fat layer exposed L89.892 Pressure ulcer of other site, stage 2 08/14/2016 Yes M70.861 Other soft tissue disorders related to use, overuse and 08/14/2016 Yes pressure, right lower leg M71.061 Abscess of bursa, right knee 09/21/2016 Yes Inactive Problems Resolved Problems Electronic Signature(s) Signed: 12/24/2016 9:15:38 AM By: Christin Fudge MD, FACS Entered By: Christin Fudge on 12/24/2016 09:15:38 Timko, [name] ([id number]) -------------------------------------------------------------------------------- Progress Note Details Patient Name: [name] T. Date of Service: 12/24/2016 8:45 AM Medical Record Number:  [id number] Patient Account Number: [id number] Date of Birth/Sex: 06/20/1945 (71 y.o. Male) Treating RN: Ahmed Prima Primary Care Provider: [name] Dupre Other Clinician: Referring Provider: [name] Dupre Treating Provider/Extender: Frann Rider in Treatment: 15 Subjective Chief Complaint Information obtained from Patient Patient is at the clinic for treatment of an open pressure ulcer to the area of the right knee History of Present Illness (HPI) The following HPI elements were documented for the patient's wound: Location: right knee swelling with skin ulceration Quality: Patient reports experiencing a dull pain to affected area(s). Severity: Patient states wound are getting worse. Duration: Patient has had the wound for Timing: Pain in wound is Intermittent (comes and goes Context: The wound occurred when the patient had a cast placed over his right lower extremity and with the cast was removed he had a swelling of the knee with problems with his skin Modifying Factors: Other treatment(s) tried include:orthopedics Associated Signs and Symptoms: swelling of both lower extremities with large amount of swelling of the knee 71 year old gentleman who has been living in an independent living facility for long-term care has had recurrent falls and recently had a large hematoma on the right knee. Known to be on aspirin and Plavix for history of CABG and was seen in the ER at the end of February after a fall. past medical history of arthritis, congestive heart failure, diabetes mellitus, hypertension, status post CABG o2, status post cholecystectomy, hernia repair and kidney surgery. recent workup for the fall showed negative clinical findings in his extremities and it was thought that he was falling due to deconditioning. a x-ray showed a right fibular neck fracture and was placed in a splint. he was asked to be seen by orthopedic surgeon. the patient cannot tell me which  orthopedic doctor had seen him and I'm not able to get any notes from [place], regarding his orthopedic care. The patient says twice during this last week he has had bloody fluid shooting out of his knee and has drained all over his boot  and the flow. 08/21/2016 -- he was seen by the nurse practitioner at the facility and and distend she aspirated about 60 mL of hemorrhagic fluid from his right knee. 08/31/2016 -- since I saw the patient last he was admitted to the hospital at [facility] between 08/23/2016 2 08/27/2016 and was treated with a pulmonary embolism. he had EKOS complete, was started on Xarelto, and also treated for chronic diastolic congestive heart failure. he was asked to follow-up with the wound center and continue management locally. 09/07/2016 -- he has an appointment to see his orthopedic surgeon later this week. 09/14/2016 -- he did see the orthopedic service and they have asked him to continue with physical therapy. They discontinued the boot and the x-ray done showed a healed fracture. They said it was okay to use a Emanuelson, Kaian T. ([id number]) wound VAC if needed. Addendum -- we got the note from the Orthopedic office where he was seen by [facility] orthopedics on [facility] in [place] and was seen by the PA Carlynn Spry. -- The x-ray which was done was reviewed and there is good healing of the fracture and from the orthopedic point of view he has healed well and they have asked him to continue with good control of his diabetes and management by the wound center including using a wound VAC if required. 09/21/2016 -- the patient's wound VAC had been applied 2 days ago, but the black sponge had not been placed laterally under the skin flap and into the area of the right of the knee, where there was a large cavity. On examination, after removal of his wound VAC, there is a large collection of seropurulent fluid and hematoma in the area starting at the lateral knee and  going down to his right lateral compartment of the leg. Clinically, this is a huge infected abscess cavity 10/01/2016 -- patient was admitted between 09/21/2016 and 09/23/2016 with cellulitis of the right lower extremity with a large infected hematoma which was drained at the bedside by surgical services. he was treated with Levaquin and continues to be on Xarelto. the patient is back on a wound VAC which has not been functioning due to the large depth of the hematoma and organized clots. He is still on Keflex orally. Addendum : I spoke to his surgeon Dr. Tama High and we discussed options for further draining this large hematoma which I suspect will get infected sooner than later. I expressed the fact that the wound VAC does not seem to be functioning well and competent enough to drain this large cavity. He will call for him in the office. [id number] 2018 -- the patient has hypotension in the sitting position and is a bit lethargic as compared to his normal self. I spoke personally to his nurse at the nursing homes, Ms Judson Roch and discussed his management including the fact that he had to have a surgical consultation for the right lower leg, with Dr. Tama High. Prior to the patient's discharge from the wound center he continues to have hypotension and hence I'm going to talk to the ER physician and have him evaluated in the ER. 10/15/2016 -- the patient was admitted to the hospital between 10/08/2016 and 10/12/2016. He was treated for hypokalemia, hyponatremia, renal insufficiency, sepsis and possible abscess of the right lower leg. he was seen by surgical services and the fluid collection was drained with a Yankauer suction from the knee wound and the opinion of the surgeons was that he would not  need any surgical intervention and continue with local wound care. I had personally communicated with Dr. Tama High regarding this. the patient is still on Xarelto. During the admission he was also  seen by Dr. Ola Spurr for infectious disease consult and he recommended continue vancomycin for MRSA and change meropenem to ceftriaxone for this Proteus. He recommended a four-week course of antibiotic therapy and follow-up back with Dr. Ola Spurr in 4 weeks. before his discharge since there was clinical improvement Dr. Ola Spurr converted the treatment to oral clindamycin and oral Keflex for a four-week course of antibiotic therapy. He was to be monitored for C. difficile. 10/29/2016 -- he missed his appointment with Dr. Rosana Hoes at the surgical office. He continues to be on oral antibiotics as per Dr. Ola Spurr. 11/16/16 patient's wound on the right knee continues to trouble him. He tells me that the nursing facility has not changed this dressing in almost a week and his wound is doing somewhat worse today in regard to undermining compared to last week's evaluation. He continues to be frustrated with this. Unfortunately he has tried to switch facilities but was not able to get into any other so far. 11/30/16 Patient's wound appears to be doing well on evaluation today. Fortunately he is tolerating the Seabrooks, Alann T. ([id number]) Iodoform packing which it does sound the facility is performing appropriately. Nonetheless he still continues to have some discomfort there is no evidence of infection today. 12/07/16 on evaluation today patient appears to be doing fairly well in regard to his wound externally although it is somewhat deeper than on previous evaluation. Obviously this seems to be a result of over packing based on what we are seeing here in the office today. I'm happy they are packing the wound but at the same time we meet need to be sure not to overpack which can worsen the depth which appears to be what happened. Fortunately there's no evidence of infection. Objective Constitutional Pulse regular. Respirations normal and unlabored. Afebrile. Vitals Time Taken: 8:39 AM, Height: 67  in, Weight: 357 lbs, BMI: 55.9, Temperature: 97.8 F, Pulse: 72 bpm, Respiratory Rate: 16 breaths/min, Blood Pressure: 108/57 mmHg. Eyes Nonicteric. Reactive to light. Ears, Nose, Mouth, and Throat Lips, teeth, and gums WNL.[name] Moist mucosa without lesions. Neck supple and nontender. No palpable supraclavicular or cervical adenopathy. Normal sized without goiter. Respiratory WNL. No retractions.. Cardiovascular Pedal Pulses WNL. No clubbing, cyanosis or edema. Lymphatic No adneopathy. No adenopathy. No adenopathy. Musculoskeletal Adexa without tenderness or enlargement.. Digits and nails w/o clubbing, cyanosis, infection, petechiae, ischemia, or inflammatory conditions.[name] Psychiatric Judgement and insight Intact.. No evidence of depression, anxiety, or agitation.[name] [name], [name] ([id number]) General Notes: the tunneling on his right lateral knee is less deep and the mouth of the tunnel is healing well. There is no evidence of cellulitis. Integumentary (Hair, Skin) No suspicious lesions. No crepitus or fluctuance. No peri-wound warmth or erythema. No masses.. Wound #1 status is Open. Original cause of wound was Pressure Injury. The wound is located on the Right,Anterior Knee. The wound measures 0.6cm length x 1cm width x 0.2cm depth; 0.471cm^2 area and 0.094cm^3 volume. There is Fat Layer (Subcutaneous Tissue) Exposed exposed. There is no tunneling noted, however, there is undermining starting at 6:00 and ending at :00 with a maximum distance of 1.4cm. There is a large amount of serosanguineous drainage noted. The wound margin is flat and intact. There is large (67-100%) red granulation within the wound bed. There is a small (1-33%) amount of necrotic tissue  within the wound bed including Adherent Slough. The periwound skin appearance exhibited: Erythema. The periwound skin appearance did not exhibit: Callus, Crepitus, Excoriation, Induration, Rash, Scarring, Dry/Scaly, Maceration,  Atrophie Blanche, Cyanosis, Ecchymosis, Hemosiderin Staining, Mottled, Pallor, Rubor. The surrounding wound skin color is noted with erythema which is circumferential. Periwound temperature was noted as No Abnormality. The periwound has tenderness on palpation. 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 Cleanse wound with mild soap and water Anesthetic: Wound #1 Right,Anterior Knee: Topical Lidocaine 4% cream applied to wound bed prior to debridement - Only in the Wound Clinic Primary Wound Dressing: [name], [name] ([id number]) Wound #1 Right,Anterior Knee: Iodoform packing Gauze - pack lightly in wound (ONLY ONE SINGLE LAYER) PLEASE FOLLOW THESE ORDERS Secondary Dressing: Wound #1 Right,Anterior Knee: ABD pad Dry Gauze Conform/Kerlix Other - tape Dressing Change Frequency: Wound #1 Right,Anterior Knee: Change dressing every day. Other: - as needed Follow-up Appointments: Wound #1 Right,Anterior Knee: Return Appointment in 1 week. Edema Control: Wound #1 Right,Anterior Knee: Elevate legs to the level of the heart and pump ankles as often as possible Additional Orders / Instructions: Wound #1 Right,Anterior Knee: Increase protein intake. Activity as tolerated Medications-please add to medication list.: Wound #1 Right,Anterior Knee: Other: - Vitamin C, Zinc, MVI I have recommended: 1. Packing the wound at the knee with 1/4 inch iodoform gauze to be changed daily and when necessary. 2. Continue with physiotherapy and other  rehabilitation 3. He has regularly at the wound center Electronic Signature(s) Signed: 12/24/2016 9:17:29 AM By: Christin Fudge MD, FACS Entered By: Christin Fudge on 12/24/2016 09:17:28 [name], [name] ([id number]) -------------------------------------------------------------------------------- SuperBill Details Patient Name: [name] T. Date of Service: 12/24/2016 Medical Record Number: [id number] Patient Account Number: [id number] Date of Birth/Sex: Oct 18, 1945 (71 y.o. 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 L89.892 Pressure ulcer of other site, stage 2 M70.861 Other soft tissue disorders related to use, overuse and pressure, right lower leg M71.061 Abscess of bursa, right knee Facility Procedures CPT4 Code: [id number] Description: 99213 - WOUND CARE VISIT-LEV 3 EST PT Modifier: Quantity: 1 Physician Procedures CPT4: Description Modifier Quantity Code [id number] - WC PHYS LEVEL 3 - EST PT 1 ICD-10 Description Diagnosis E11.622 Type 2 diabetes mellitus with other skin ulcer L97.812 Non-pressure chronic ulcer of other part of right lower leg with fat layer  exposed 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

## 2016-12-31 ENCOUNTER — Encounter: Payer: Medicare Other | Admitting: Physician Assistant

## 2016-12-31 DIAGNOSIS — E11622 Type 2 diabetes mellitus with other skin ulcer: Secondary | ICD-10-CM | POA: Diagnosis not present

## 2017-01-02 NOTE — Progress Notes (Signed)
ASHBY, MOSKAL ([id number]) Visit Report for 12/31/2016 Chief Complaint Document Details Patient Name: [name], [name]. Date of Service: 12/31/2016 8:45 AM Medical Record Number: [id number] Patient Account Number: [id number] Date of Birth/Sex: September 02, 1945 (71 y.o. Male) Treating RN: Ahmed Prima Primary Care Provider: [name] Dupre Other Clinician: Referring Provider: [name] Dupre Treating Provider/Extender: Melburn Hake, Rasool Rommel Weeks in Treatment: 73 Information Obtained from: Patient Chief Complaint Patient is at the clinic for treatment of an open pressure ulcer to the area of the right knee Electronic Signature(s) Signed: 01/01/2017 10:18:42 AM By: Worthy Keeler PA-C Entered By: Worthy Keeler on 12/31/2016 09:05:20 Hogston, [name] ([id number]) -------------------------------------------------------------------------------- HPI Details Patient Name: [name]. Date of Service: 12/31/2016 8:45 AM Medical Record Number: [id number] Patient Account Number: [id number] Date of Birth/Sex: 1945/12/02 (71 y.o. Male) Treating RN: Ahmed Prima Primary Care Provider: [name] Dupre Other Clinician: Referring Provider: [name] Dupre Treating Provider/Extender: Melburn Hake, Shiara Mcgough Weeks in Treatment: 70 History of Present Illness Location: right knee swelling with skin ulceration Quality: Patient reports experiencing a dull pain to affected area(s). Severity: Patient states wound are getting worse. Duration: Patient has had the wound for <4 weeks prior to presenting for treatment Timing: Pain in wound is Intermittent (comes and goes Context: The wound occurred when the patient had a cast placed over his right lower extremity and with the cast was removed he had a swelling of the knee with problems with his skin Modifying Factors: Other treatment(s) tried include:orthopedics Associated Signs and Symptoms: swelling of both lower extremities with large amount of swelling of  the knee HPI Description: 71 year old gentleman who has been living in an independent living facility for long-term care has had recurrent falls and recently had a large hematoma on the right knee. Known to be on aspirin and Plavix for history of CABG and was seen in the ER at the end of February after a fall. past medical history of arthritis, congestive heart failure, diabetes mellitus, hypertension, status post CABG o2, status post cholecystectomy, hernia repair and kidney surgery. recent workup for the fall showed negative clinical findings in his extremities and it was thought that he was falling due to deconditioning. a x-ray showed a right fibular neck fracture and was placed in a splint. he was asked to be seen by orthopedic surgeon. the patient cannot tell me which orthopedic doctor had seen him and I'm not able to get any notes from [place], regarding his orthopedic care. The patient says twice during this last week he has had bloody fluid shooting out of his knee and has drained all over his boot and the flow. 08/21/2016 -- he was seen by the nurse practitioner at the facility and and distend she aspirated about 60 mL of hemorrhagic fluid from his right knee. 08/31/2016 -- since I saw the patient last he was admitted to the hospital at [facility] between 08/23/2016 2 08/27/2016 and was treated with a pulmonary embolism. he had EKOS complete, was started on Xarelto, and also treated for chronic diastolic congestive heart failure. he was asked to follow-up with the wound center and continue management locally. 09/07/2016 -- he has an appointment to see his orthopedic surgeon later this week. 09/14/2016 -- he did see the orthopedic service and they have asked him to continue with physical therapy. They discontinued the boot and the x-ray done showed a healed fracture. They said it was okay to use a wound VAC if needed. Addendum -- we got the note from the Orthopedic office  where he was  seen by [facility] orthopedics on [facility] in [place] and was seen by the PA Carlynn Spry. -- The x-ray which was done was reviewed and there is good healing of the fracture and from the orthopedic point of view he has healed well and they have asked him to continue with good control of his diabetes and management by the wound center including using a wound VAC if required. WATSON, ROBARGE ([id number]) 09/21/2016 -- the patient's wound VAC had been applied 2 days ago, but the black sponge had not been placed laterally under the skin flap and into the area of the right of the knee, where there was a large cavity. On examination, after removal of his wound VAC, there is a large collection of seropurulent fluid and hematoma in the area starting at the lateral knee and going down to his right lateral compartment of the leg. Clinically, this is a huge infected abscess cavity 10/01/2016 -- patient was admitted between 09/21/2016 and 09/23/2016 with cellulitis of the right lower extremity with a large infected hematoma which was drained at the bedside by surgical services. he was treated with Levaquin and continues to be on Xarelto. the patient is back on a wound VAC which has not been functioning due to the large depth of the hematoma and organized clots. He is still on Keflex orally. Addendum : I spoke to his surgeon Dr. Tama High and we discussed options for further draining this large hematoma which I suspect will get infected sooner than later. I expressed the fact that the wound VAC does not seem to be functioning well and competent enough to drain this large cavity. He will call for him in the office. [id number] 2018 -- the patient has hypotension in the sitting position and is a bit lethargic as compared to his normal self. I spoke personally to his nurse at the nursing homes, Ms Judson Roch and discussed his management including the fact that he had to have a surgical consultation for the  right lower leg, with Dr. Tama High. Prior to the patient's discharge from the wound center he continues to have hypotension and hence I'm going to talk to the ER physician and have him evaluated in the ER. 10/15/2016 -- the patient was admitted to the hospital between 10/08/2016 and 10/12/2016. He was treated for hypokalemia, hyponatremia, renal insufficiency, sepsis and possible abscess of the right lower leg. he was seen by surgical services and the fluid collection was drained with a Yankauer suction from the knee wound and the opinion of the surgeons was that he would not need any surgical intervention and continue with local wound care. I had personally communicated with Dr. Tama High regarding this. the patient is still on Xarelto. During the admission he was also seen by Dr. Ola Spurr for infectious disease consult and he recommended continue vancomycin for MRSA and change meropenem to ceftriaxone for this Proteus. He recommended a four-week course of antibiotic therapy and follow-up back with Dr. Ola Spurr in 4 weeks. before his discharge since there was clinical improvement Dr. Ola Spurr converted the treatment to oral clindamycin and oral Keflex for a four-week course of antibiotic therapy. He was to be monitored for C. difficile. 10/29/2016 -- he missed his appointment with Dr. Rosana Hoes at the surgical office. He continues to be on oral antibiotics as per Dr. Ola Spurr. 11/16/16 patient's wound on the right knee continues to trouble him. He tells me that the nursing facility has not changed this dressing  in almost a week and his wound is doing somewhat worse today in regard to undermining compared to last week's evaluation. He continues to be frustrated with this. Unfortunately he has tried to switch facilities but was not able to get into any other so far. 11/30/16 Patient's wound appears to be doing well on evaluation today. Fortunately he is tolerating the Iodoform packing  which it does sound the facility is performing appropriately. Nonetheless he still continues to have some discomfort there is no evidence of infection today. 12/07/16 on evaluation today patient appears to be doing fairly well in regard to his wound externally although it is somewhat deeper than on previous evaluation. Obviously this seems to be a result of over packing based on what we are seeing here in the office today. I'm happy they are packing the wound but at the same time we meet need to be sure not to overpack which can worsen the depth which appears to be Karpel, Lewie T. ([id number]) what happened. Fortunately there's no evidence of infection. 12/31/16 on evaluation today patient's knee wound appears to be doing slightly better. He shows no evidence of infection at this point. He still does have undermining towards the 6 o'clock location. Otherwise things seem to be doing well. No fevers, chills, nausea, or vomiting noted at this time. Electronic Signature(s) Signed: 01/01/2017 10:18:42 AM By: Worthy Keeler PA-C Entered By: Worthy Keeler on 12/31/2016 09:06:45 Wickes, [name] ([id number]) -------------------------------------------------------------------------------- Physical Exam Details Patient Name: [name] Date of Service: 12/31/2016 8:45 AM Medical Record Number: [id number] Patient Account Number: [id number] Date of Birth/Sex: May 09, 1946 (71 y.o. Male) Treating RN: Ahmed Prima Primary Care Provider: [name] Dupre Other Clinician: Referring Provider: [name] Dupre Treating Provider/Extender: STONE III, Itay Mella Weeks in Treatment: 2 Constitutional Well-nourished and well-hydrated in no acute distress. Respiratory normal breathing without difficulty. clear to auscultation bilaterally. Cardiovascular regular rate and rhythm with normal S1, S2. Psychiatric this patient is able to make decisions and demonstrates good insight into disease process. Alert  and Oriented x 3. pleasant and cooperative. Notes Patient has no evidence of erythema surrounding the wound at this point. There is no tenderness to palpation. No need for debridement was noted today. Electronic Signature(s) Signed: 01/01/2017 10:18:42 AM By: Worthy Keeler PA-C Entered By: Worthy Keeler on 12/31/2016 09:07:26 Oyervides, [name] ([id number]) -------------------------------------------------------------------------------- Physician Orders Details Patient Name: [name] Date of Service: 12/31/2016 8:45 AM Medical Record Number: [id number] Patient Account Number: [id number] Date of Birth/Sex: 1946/04/28 (71 y.o. 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 o Cleanse wound with mild soap and water Anesthetic Wound #1 Right,Anterior Knee o Topical Lidocaine 4% cream applied to wound bed prior to debridement - Only in the Wound Clinic Primary Wound Dressing Wound #1 Right,Anterior Knee o Iodoform packing Gauze - pack lightly in wound (ONLY ONE SINGLE LAYER) PLEASE FOLLOW THESE ORDERS  Secondary Dressing Wound #1 Right,Anterior Knee o ABD pad o Dry Gauze o Conform/Kerlix o Other - tape Dressing Change Frequency Wound #1  Right,Anterior Knee o Change dressing every day. DAMARE, SERANO ([id number]) o Other: - as needed Follow-up Appointments Wound #1 Right,Anterior Knee o Return Appointment in 1 week. Edema Control Wound #1 Right,Anterior Knee o Elevate legs to the level of the heart and pump ankles as often as possible Additional Orders / Instructions Wound #1 Right,Anterior Knee o Increase protein intake. o Activity as tolerated Medications-please add to medication list. Wound #1 Right,Anterior Knee o Other: - Vitamin C, Zinc, MVI Notes Going to recommend that we continue with the Iodoform packing for the next week. We will see him for reevaluation following to see were things stand at that point. If anything worsened significantly in the meantime patient will contact our office or have the nursing facility do so on his the half for additional recommendations. Otherwise we will see were things stand on reevaluation. Please see above for specific wound care orders. Electronic Signature(s) Signed: 01/01/2017 10:18:42 AM By: Worthy Keeler PA-C Entered By: Worthy Keeler on 12/31/2016 09:08:10 Dlouhy, [name] ([id number]) -------------------------------------------------------------------------------- Problem List Details Patient Name: [name] Date of Service: 12/31/2016 8:45 AM Medical Record Number: [id number] Patient Account Number: [id number] Date of Birth/Sex: December 31, 1945 (71 y.o. 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, right knee 08/14/2016 Yes L97.812 Non-pressure chronic ulcer of other part of right lower leg 08/14/2016 Yes with fat  layer exposed L89.892 Pressure ulcer of other site, stage 2 08/14/2016 Yes M70.861 Other soft tissue disorders related to use, overuse and 08/14/2016 Yes pressure, right lower leg M71.061 Abscess of bursa, right knee 09/21/2016 Yes Inactive Problems Resolved Problems Electronic Signature(s) Signed: 01/01/2017 10:18:42 AM By: Worthy Keeler PA-C Entered By: Worthy Keeler on 12/31/2016 08:58:52 Morten, Sherron Darene Lamer ([id number]) -------------------------------------------------------------------------------- Progress Note Details Patient Name: [name] Date of Service: 12/31/2016 8:45 AM Medical Record Number: [id number] Patient Account Number: [id number] Date of Birth/Sex: 1945/09/19 (71 y.o. Male) Treating RN: Ahmed Prima Primary Care Provider: [name] Dupre Other Clinician: Referring Provider: [name] Dupre Treating Provider/Extender: Melburn Hake, Trenace Coughlin Weeks in Treatment: 48 Subjective Chief Complaint Information obtained from Patient Patient is at the clinic for treatment of an open pressure ulcer to the area of the right knee History of Present Illness (HPI) The following HPI elements were documented for the patient's wound: Location: right knee swelling with skin ulceration Quality: Patient reports experiencing a dull pain to affected area(s). Severity: Patient states wound are getting worse. Duration: Patient has had the wound for Timing: Pain in wound is Intermittent (comes and goes Context: The wound occurred when the patient had a cast placed over his right lower extremity and with the cast was removed he had a swelling of the knee with problems with his skin Modifying Factors: Other treatment(s) tried include:orthopedics Associated Signs and Symptoms: swelling of both lower extremities with large amount of swelling of the knee 71 year old gentleman who has been living in an independent living facility for long-term care has had recurrent falls and recently had  a large hematoma on the right knee. Known to be on aspirin and Plavix for history of CABG and was seen in the ER  at the end of February after a fall. past medical history of arthritis, congestive heart failure, diabetes mellitus, hypertension, status post CABG o2, status post cholecystectomy, hernia repair and kidney surgery. recent workup for the fall showed negative clinical findings in his extremities and it was thought that he was falling due to deconditioning. a x-ray showed a right fibular neck fracture and was placed in a splint. he was asked to be seen by orthopedic surgeon. the patient cannot tell me which orthopedic doctor had seen him and I'm not able to get any notes from [place], regarding his orthopedic care. The patient says twice during this last week he has had bloody fluid shooting out of his knee and has drained all over his boot and the flow. 08/21/2016 -- he was seen by the nurse practitioner at the facility and and distend she aspirated about 60 mL of hemorrhagic fluid from his right knee. 08/31/2016 -- since I saw the patient last he was admitted to the hospital at [facility] between 08/23/2016 2 08/27/2016 and was treated with a pulmonary embolism. he had EKOS complete, was started on Xarelto, and also treated for chronic diastolic congestive heart failure. he was asked to follow-up with the wound center and continue management locally. 09/07/2016 -- he has an appointment to see his orthopedic surgeon later this week. 09/14/2016 -- he did see the orthopedic service and they have asked him to continue with physical therapy. They discontinued the boot and the x-ray done showed a healed fracture. They said it was okay to use a Mansel, Piercen T. ([id number]) wound VAC if needed. Addendum -- we got the note from the Orthopedic office where he was seen by [facility] orthopedics on [facility] in [place] and was seen by the PA Carlynn Spry. -- The x-ray which was done  was reviewed and there is good healing of the fracture and from the orthopedic point of view he has healed well and they have asked him to continue with good control of his diabetes and management by the wound center including using a wound VAC if required. 09/21/2016 -- the patient's wound VAC had been applied 2 days ago, but the black sponge had not been placed laterally under the skin flap and into the area of the right of the knee, where there was a large cavity. On examination, after removal of his wound VAC, there is a large collection of seropurulent fluid and hematoma in the area starting at the lateral knee and going down to his right lateral compartment of the leg. Clinically, this is a huge infected abscess cavity 10/01/2016 -- patient was admitted between 09/21/2016 and 09/23/2016 with cellulitis of the right lower extremity with a large infected hematoma which was drained at the bedside by surgical services. he was treated with Levaquin and continues to be on Xarelto. the patient is back on a wound VAC which has not been functioning due to the large depth of the hematoma and organized clots. He is still on Keflex orally. Addendum : I spoke to his surgeon Dr. Tama High and we discussed options for further draining this large hematoma which I suspect will get infected sooner than later. I expressed the fact that the wound VAC does not seem to be functioning well and competent enough to drain this large cavity. He will call for him in the office. [id number] 2018 -- the patient has hypotension in the sitting position and is a bit lethargic as compared to his normal self. I  spoke personally to his nurse at the nursing homes, Ms Judson Roch and discussed his management including the fact that he had to have a surgical consultation for the right lower leg, with Dr. Tama High. Prior to the patient's discharge from the wound center he continues to have hypotension and hence I'm going to talk to the  ER physician and have him evaluated in the ER. 10/15/2016 -- the patient was admitted to the hospital between 10/08/2016 and 10/12/2016. He was treated for hypokalemia, hyponatremia, renal insufficiency, sepsis and possible abscess of the right lower leg. he was seen by surgical services and the fluid collection was drained with a Yankauer suction from the knee wound and the opinion of the surgeons was that he would not need any surgical intervention and continue with local wound care. I had personally communicated with Dr. Tama High regarding this. the patient is still on Xarelto. During the admission he was also seen by Dr. Ola Spurr for infectious disease consult and he recommended continue vancomycin for MRSA and change meropenem to ceftriaxone for this Proteus. He recommended a four-week course of antibiotic therapy and follow-up back with Dr. Ola Spurr in 4 weeks. before his discharge since there was clinical improvement Dr. Ola Spurr converted the treatment to oral clindamycin and oral Keflex for a four-week course of antibiotic therapy. He was to be monitored for C. difficile. 10/29/2016 -- he missed his appointment with Dr. Rosana Hoes at the surgical office. He continues to be on oral antibiotics as per Dr. Ola Spurr. 11/16/16 patient's wound on the right knee continues to trouble him. He tells me that the nursing facility has not changed this dressing in almost a week and his wound is doing somewhat worse today in regard to undermining compared to last week's evaluation. He continues to be frustrated with this. Unfortunately he has tried to switch facilities but was not able to get into any other so far. 11/30/16 Patient's wound appears to be doing well on evaluation today. Fortunately he is tolerating the Linhart, Jaedon T. ([id number]) Iodoform packing which it does sound the facility is performing appropriately. Nonetheless he still continues to have some discomfort there is no  evidence of infection today. 12/07/16 on evaluation today patient appears to be doing fairly well in regard to his wound externally although it is somewhat deeper than on previous evaluation. Obviously this seems to be a result of over packing based on what we are seeing here in the office today. I'm happy they are packing the wound but at the same time we meet need to be sure not to overpack which can worsen the depth which appears to be what happened. Fortunately there's no evidence of infection. 12/31/16 on evaluation today patient's knee wound appears to be doing slightly better. He shows no evidence of infection at this point. He still does have undermining towards the 6 o'clock location. Otherwise things seem to be doing well. No fevers, chills, nausea, or vomiting noted at this time. Objective Constitutional Well-nourished and well-hydrated in no acute distress. Vitals Time Taken: 8:39 AM, Height: 67 in, Weight: 357 lbs, BMI: 55.9, Temperature: 97.9 F, Pulse: 89 bpm, Respiratory Rate: 16 breaths/min, Blood Pressure: 139/85 mmHg. Respiratory normal breathing without difficulty. clear to auscultation bilaterally. Cardiovascular regular rate and rhythm with normal S1, S2. Psychiatric this patient is able to make decisions and demonstrates good insight into disease process. Alert and Oriented x 3. pleasant and cooperative. General Notes: Patient has no evidence of erythema surrounding the wound at this point. There  is no tenderness to palpation. No need for debridement was noted today. Integumentary (Hair, Skin) Wound #1 status is Open. Original cause of wound was Pressure Injury. The wound is located on the Right,Anterior Knee. The wound measures 0.6cm length x 1cm width x 0.2cm depth; 0.471cm^2 area and 0.094cm^3 volume. There is Fat Layer (Subcutaneous Tissue) Exposed exposed. There is no tunneling noted, however, there is undermining starting at 5:00 and ending at 7:00 with a maximum  distance of 1.4cm. There is a large amount of serosanguineous drainage noted. The wound margin is flat and intact. RUSTYN, CONERY TMarland Kitchen ([id number]) There is large (67-100%) red granulation within the wound bed. There is a small (1-33%) amount of necrotic tissue within the wound bed including Adherent Slough. The periwound skin appearance exhibited: Erythema. The periwound skin appearance did not exhibit: Callus, Crepitus, Excoriation, Induration, Rash, Scarring, Dry/Scaly, Maceration, Atrophie Blanche, Cyanosis, Ecchymosis, Hemosiderin Staining, Mottled, Pallor, Rubor. The surrounding wound skin color is noted with erythema which is circumferential. Periwound temperature was noted as No Abnormality. The periwound has tenderness on palpation. 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 Cleanse wound with mild soap and water Anesthetic: Wound #1 Right,Anterior Knee: Topical Lidocaine 4% cream applied to wound bed prior to debridement - Only in the Wound Clinic Primary Wound Dressing: Wound #1 Right,Anterior Knee: Iodoform packing Gauze - pack lightly in wound (ONLY ONE SINGLE LAYER) PLEASE FOLLOW THESE ORDERS Secondary Dressing: Wound #1 Right,Anterior Knee: ABD pad Dry Gauze Conform/Kerlix Other - tape Dressing Change Frequency: Wound #1 Right,Anterior Knee: Change dressing every day. Other: - as needed Forgione, Kanishk T. ([id number]) Follow-up Appointments: Wound #1 Right,Anterior Knee: Return Appointment in 1 week. Edema Control: Wound  #1 Right,Anterior Knee: Elevate legs to the level of the heart and pump ankles as often as possible Additional Orders / Instructions: Wound #1 Right,Anterior Knee: Increase protein intake. Activity as tolerated Medications-please add to medication list.: Wound #1 Right,Anterior Knee: Other: - Vitamin C, Zinc, MVI General Notes: Going to recommend that we continue with the Iodoform packing for the next week. We will see him for reevaluation following to see were things stand at that point. If anything worsened significantly in the meantime patient will contact our office or have the nursing facility do so on his the half for additional recommendations. Otherwise we will see were things stand on reevaluation. Please see above for specific wound care orders. Electronic Signature(s) Signed: 01/01/2017 10:18:42 AM By: Worthy Keeler PA-C Entered By: Worthy Keeler on 12/31/2016 09:08:20 Camper, [name] ([id number]) -------------------------------------------------------------------------------- SuperBill Details Patient Name: [name] Date of Service: 12/31/2016 Medical Record Number: [id number] Patient Account Number: [id number] Date of Birth/Sex: [birth year] (71 y.o. 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 L89.892 Pressure ulcer of other site, stage 2  I16.429 Other soft tissue disorders related to use, overuse and pressure, right lower leg M71.061 Abscess of bursa, right knee Facility Procedures CPT4 Code: [id number] Description: 99213 - WOUND CARE VISIT-LEV 3 EST PT Modifier: Quantity:  1 Physician Procedures CPT4: Description Modifier Quantity Code [id number] - WC PHYS LEVEL 3 - EST PT 1 ICD-10 Description Diagnosis 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

## 2017-01-07 ENCOUNTER — Encounter: Payer: Medicare Other | Admitting: Physician Assistant

## 2017-01-07 DIAGNOSIS — E11622 Type 2 diabetes mellitus with other skin ulcer: Secondary | ICD-10-CM | POA: Diagnosis not present

## 2017-01-08 NOTE — Progress Notes (Signed)
[name] ([id number]) Visit Report for 01/07/2017 Chief Complaint Document Details Patient Name: [name], [name]. Date of Service: 01/07/2017 11:00 AM Medical Record Number: [id number] Patient Account Number: [id number] Date of Birth/Sex: 13-Jan-1946 (71 y.o. Male) Treating RN: Phillis Haggis Primary Care Provider: Bluford Main Other Clinician: Referring Provider: Bluford Main Treating Provider/Extender: Linwood Dibbles, Kiyona Mcnall Weeks in Treatment: 20 Information Obtained from: Patient Chief Complaint Patient is at the clinic for treatment of an open pressure ulcer to the area of the right knee Electronic Signature(s) Signed: 01/07/2017 12:41:32 PM By: Lenda Kelp PA-C Entered By: Lenda Kelp on 01/07/2017 11:27:49 Guzzo, Dalbert Garnet ([id number]) -------------------------------------------------------------------------------- HPI Details Patient Name: [name]. Date of Service: 01/07/2017 11:00 AM Medical Record Number: [id number] Patient Account Number: [id number] Date of Birth/Sex: 1945-09-04 (71 y.o. Male) Treating RN: Phillis Haggis Primary Care Provider: Bluford Main Other Clinician: Referring Provider: Bluford Main Treating Provider/Extender: Linwood Dibbles, Promiss Labarbera Weeks in Treatment: 20 History of Present Illness Location: right knee swelling with skin ulceration Quality: Patient reports experiencing a dull pain to affected area(s). Severity: Patient states wound are getting worse. Duration: Patient has had the wound for <4 weeks prior to presenting for treatment Timing: Pain in wound is Intermittent (comes and goes Context: The wound occurred when the patient had a cast placed over his right lower extremity and with the cast was removed he had a swelling of the knee with problems with his skin Modifying Factors: Other treatment(s) tried include:orthopedics Associated Signs and Symptoms: swelling of both lower extremities with large amount of  swelling of the knee HPI Description: 71 year old gentleman who has been living in an independent living facility for long-term care has had recurrent falls and recently had a large hematoma on the right knee. Known to be on aspirin and Plavix for history of CABG and was seen in the ER at the end of February after a fall. past medical history of arthritis, congestive heart failure, diabetes mellitus, hypertension, status post CABG o2, status post cholecystectomy, hernia repair and kidney surgery. recent workup for the fall showed negative clinical findings in his extremities and it was thought that he was falling due to deconditioning. a x-ray showed a right fibular neck fracture and was placed in a splint. he was asked to be seen by orthopedic surgeon. the patient cannot tell me which orthopedic doctor had seen him and I'm not able to get any notes from Epic, regarding his orthopedic care. The patient says twice during this last week he has had bloody fluid shooting out of his knee and has drained all over his boot and the flow. 08/21/2016 -- he was seen by the nurse practitioner at the facility and and distend she aspirated about 60 mL of hemorrhagic fluid from his right knee. 08/31/2016 -- since I saw the patient last he was admitted to the hospital at [facility] between 08/23/2016 2 08/27/2016 and was treated with a pulmonary embolism. he had EKOS complete, was started on Xarelto, and also treated for chronic diastolic congestive heart failure. he was asked to follow-up with the wound center and continue management locally. 09/07/2016 -- he has an appointment to see his orthopedic surgeon later this week. 09/14/2016 -- he did see the orthopedic service and they have asked him to continue with physical therapy. They discontinued the boot and the x-ray done showed a healed fracture. They said it was okay to use a wound VAC if needed. Addendum -- we got the note from the Orthopedic office  where he was seen by [facility] orthopedics on [facility] in [place] and was seen by the PA Altamese Cabal. -- The x-ray which was done was reviewed and there is good healing of the fracture and from the orthopedic point of view he has healed well and they have asked him to continue with good control of his diabetes and management by the wound center including using a wound VAC if required. HENRRY, [name] ([id number]) 09/21/2016 -- the patient's wound VAC had been applied 2 days ago, but the black sponge had not been placed laterally under the skin flap and into the area of the right of the knee, where there was a large cavity. On examination, after removal of his wound VAC, there is a large collection of seropurulent fluid and hematoma in the area starting at the lateral knee and going down to his right lateral compartment of the leg. Clinically, this is a huge infected abscess cavity 10/01/2016 -- patient was admitted between 09/21/2016 and 09/23/2016 with cellulitis of the right lower extremity with a large infected hematoma which was drained at the bedside by surgical services. he was treated with Levaquin and continues to be on Xarelto. the patient is back on a wound VAC which has not been functioning due to the large depth of the hematoma and organized clots. He is still on Keflex orally. Addendum : I spoke to his surgeon Dr. Satira Mccallum and we discussed options for further draining this large hematoma which I suspect will get infected sooner than later. I expressed the fact that the wound VAC does not seem to be functioning well and competent enough to drain this large cavity. He will call for him in the office. [id number] 2018 -- the patient has hypotension in the sitting position and is a bit lethargic as compared to his normal self. I spoke personally to his nurse at the nursing homes, Ms Maralyn Sago and discussed his management including the fact that he had to have a surgical  consultation for the right lower leg, with Dr. Satira Mccallum. Prior to the patient's discharge from the wound center he continues to have hypotension and hence I'm going to talk to the ER physician and have him evaluated in the ER. 10/15/2016 -- the patient was admitted to the hospital between 10/08/2016 and 10/12/2016. He was treated for hypokalemia, hyponatremia, renal insufficiency, sepsis and possible abscess of the right lower leg. he was seen by surgical services and the fluid collection was drained with a Yankauer suction from the knee wound and the opinion of the surgeons was that he would not need any surgical intervention and continue with local wound care. I had personally communicated with Dr. Satira Mccallum regarding this. the patient is still on Xarelto. During the admission he was also seen by Dr. Sampson Goon for infectious disease consult and he recommended continue vancomycin for MRSA and change meropenem to ceftriaxone for this Proteus. He recommended a four-week course of antibiotic therapy and follow-up back with Dr. Sampson Goon in 4 weeks. before his discharge since there was clinical improvement Dr. Sampson Goon converted the treatment to oral clindamycin and oral Keflex for a four-week course of antibiotic therapy. He was to be monitored for C. difficile. 10/29/2016 -- he missed his appointment with Dr. Earlene Plater at the surgical office. He continues to be on oral antibiotics as per Dr. Sampson Goon. 11/16/16 patient's wound on the right knee continues to trouble him. He tells me that the nursing facility has not changed this dressing in  almost a week and his wound is doing somewhat worse today in regard to undermining compared to last week's evaluation. He continues to be frustrated with this. Unfortunately he has tried to switch facilities but was not able to get into any other so far. 11/30/16 Patient's wound appears to be doing well on evaluation today. Fortunately he is tolerating  the Iodoform packing which it does sound the facility is performing appropriately. Nonetheless he still continues to have some discomfort there is no evidence of infection today. 12/07/16 on evaluation today patient appears to be doing fairly well in regard to his wound externally although it is somewhat deeper than on previous evaluation. Obviously this seems to be a result of over packing based on what we are seeing here in the office today. I'm happy they are packing the wound but at the same time we meet need to be sure not to overpack which can worsen the depth which appears to be Lizak, Collin T. ([id number]) what happened. Fortunately there's no evidence of infection. 12/31/16 on evaluation today patient's knee wound appears to be doing slightly better. He shows no evidence of infection at this point. He still does have undermining towards the 6 o'clock location. Otherwise things seem to be doing well. No fevers, chills, nausea, or vomiting noted at this time. 01/07/17 on evaluation today patient's right knee wound appears to be doing fairly well there's no evidence of infection. Specifically No fevers, chills, nausea, or vomiting noted at this time. He is not having any discomfort at this point although he does have some dry skin of the right lower extremity he tells me he has been scratching this he appears to have some petechiae likely due to excoriation. Electronic Signature(s) Signed: 01/07/2017 12:41:32 PM By: Lenda Kelp PA-C Entered By: Lenda Kelp on 01/07/2017 11:28:11 Bauch, Dalbert Garnet ([id number]) -------------------------------------------------------------------------------- Physical Exam Details Patient Name: Jerrell Mylar T. Date of Service: 01/07/2017 11:00 AM Medical Record Number: [id number] Patient Account Number: [id number] Date of Birth/Sex: [birth year] (72 y.o. Male) Treating RN: Phillis Haggis Primary Care Provider: Bluford Main Other  Clinician: Referring Provider: Bluford Main Treating Provider/Extender: STONE III, Nishanth Mccaughan Weeks in Treatment: 20 Constitutional Well-nourished and well-hydrated in no acute distress. Respiratory normal breathing without difficulty. clear to auscultation bilaterally. Cardiovascular regular rate and rhythm with normal S1, S2. Psychiatric this patient is able to make decisions and demonstrates good insight into disease process. Alert and Oriented x 3. pleasant and cooperative. Notes Patient continues to have tunneling at the 6 o'clock location but fortunately no purulent discharge noted. It does appear the wound may have been slightly over packing but again that is hard to determine if that's ongoing or just a single event. Nonetheless I did want to encourage the facility to be sure to just lightly pack this wound. Otherwise I'm pleased with how things are progressing. Electronic Signature(s) Signed: 01/07/2017 12:41:32 PM By: Lenda Kelp PA-C Entered By: Lenda Kelp on 01/07/2017 11:28:52 Lugar, Dalbert Garnet ([id number]) -------------------------------------------------------------------------------- Physician Orders Details Patient Name: Jerrell Mylar T. Date of Service: 01/07/2017 11:00 AM Medical Record Number: [id number] Patient Account Number: [id number] Date of Birth/Sex: 1945/08/05 (71 y.o. 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 o Cleanse wound with mild soap and water Anesthetic Wound #1 Right,Anterior Knee o Topical Lidocaine 4% cream applied to wound bed prior to debridement - Only in the Wound Clinic Skin Barriers/Peri-Wound Care Wound #1 Right,Anterior Knee o Moisturizing lotion Primary Wound Dressing Wound #1 Right,Anterior Knee o Iodoform packing Gauze - pack lightly in wound (ONLY ONE SINGLE LAYER) PLEASE FOLLOW THESE ORDERS Secondary Dressing Wound #1 Right,Anterior Knee o ABD pad o Dry Gauze o Conform/Kerlix o Other - tape Mcgue, Vivan T. ([id number]) Dressing Change Frequency Wound #1 Right,Anterior Knee o Change dressing every day. o Other: - as needed Follow-up Appointments Wound #1 Right,Anterior Knee o Return Appointment in 1 week. Edema Control Wound #1 Right,Anterior Knee o Elevate legs to the level of the heart and pump ankles as often as possible Additional Orders / Instructions Wound #1 Right,Anterior Knee o Increase protein intake. o Activity as tolerated Medications-please add to medication list. Wound #1 Right,Anterior Knee o Other: - Vitamin C, Zinc, MVI Notes Will continue with Current wound care measures for the next week. If anything worsens in the interim the nursing facility will contact us for additional recommendations. Otherwise we will see him at that point to see were things stand. Please see above for current wound care orders. Electronic Signature(s) Signed: 01/07/2017 12:41:32 PM By: Lenda Kelp PA-C Entered By: Lenda Kelp on 01/07/2017 11:29:27 Dority, Dalbert Garnet ([id number]) -------------------------------------------------------------------------------- Problem List Details Patient Name: Jerrell Mylar T. Date of Service: 01/07/2017 11:00 AM Medical Record Number: [id number] Patient  Account Number: [id number] Date of Birth/Sex: 1946-03-18 (71 y.o. 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, right knee 08/14/2016 Yes L97.812 Non-pressure chronic ulcer of other part of right lower leg 08/14/2016 Yes with fat layer exposed L89.892 Pressure ulcer of other site, stage 2 08/14/2016 Yes M70.861 Other soft tissue disorders related to use, overuse and 08/14/2016 Yes pressure, right lower leg M71.061 Abscess of bursa, right knee 09/21/2016 Yes Inactive Problems Resolved Problems Electronic Signature(s) Signed: 01/07/2017 12:41:32 PM By: Lenda Kelp PA-C Entered By: Lenda Kelp on 01/07/2017 11:21:06 Yingst, Dalbert Garnet ([id number]) -------------------------------------------------------------------------------- Progress Note Details Patient Name: [name]. Date of Service: 01/07/2017 11:00 AM Medical Record Number: [id number] Patient Account Number: [id number] Date of Birth/Sex: [birth year] (71 y.o. Male) Treating RN: Phillis Haggis Primary Care Provider: Bluford Main Other Clinician: Referring Provider: Bluford Main Treating Provider/Extender: Linwood Dibbles, Londen Lorge Weeks in Treatment: 20 Subjective Chief Complaint Information obtained from Patient Patient is at the clinic for treatment of an open pressure ulcer to the area of the right knee History of Present Illness (HPI) The following HPI elements were documented for the patient's wound: Location: right knee swelling with skin ulceration Quality: Patient reports experiencing a dull pain to affected area(s). Severity: Patient states wound are getting worse. Duration: Patient has  had the wound for Timing: Pain in wound is Intermittent (comes and goes Context: The wound occurred when the patient had a cast placed over his right lower  extremity and with the cast was removed he had a swelling of the knee with problems with his skin Modifying Factors: Other treatment(s) tried include:orthopedics Associated Signs and Symptoms: swelling of both lower extremities with large amount of swelling of the knee 71 year old gentleman who has been living in an independent living facility for long-term care has had recurrent falls and recently had a large hematoma on the right knee. Known to be on aspirin and Plavix for history of CABG and was seen in the ER at the end of February after a fall. past medical history of arthritis, congestive heart failure, diabetes mellitus, hypertension, status post CABG fo2, status post cholecystectomy, hernia repair and kidney surgery. recent workup for the fall showed negative clinical findings in his extremities and it was thought that he was falling due to deconditioning. a x-ray showed a right fibular neck fracture and was placed in a splint. he was asked to be seen by orthopedic surgeon. the patient cannot tell me which orthopedic doctor had seen him and I'm not able to get any notes from Epic, regarding his orthopedic care. The patient says twice during this last week he has had bloody fluid shooting out of his knee and has drained all over his boot and the flow. 08/21/2016 -- he was seen by the nurse practitioner at the facility and and distend she aspirated about 60 mL of hemorrhagic fluid from his right knee. 08/31/2016 -- since I saw the patient last he was admitted to the hospital at [facility] between 08/23/2016 2 08/27/2016 and was treated with a pulmonary embolism. he had EKOS complete, was started on Xarelto, and also treated for chronic diastolic congestive heart failure. he was asked to follow-up with the wound center and continue  management locally. 09/07/2016 -- he has an appointment to see his orthopedic surgeon later this week. 09/14/2016 -- he did see the orthopedic service and they have asked him to continue with physical therapy. They discontinued the boot and the x-ray done showed a healed fracture. They said it was okay to use a Speciale, Khayman T. ([id number]) wound VAC if needed. Addendum -- we got the note from the Orthopedic office where he was seen by [facility] orthopedics on [facility] in [place] and was seen by the PA Altamese Cabal. -- The x-ray which was done was reviewed and there is good healing of the fracture and from the orthopedic point of view he has healed well and they have asked him to continue with good control of his diabetes and management by the wound center including using a wound VAC if required. 09/21/2016 -- the patient's wound VAC had been applied 2 days ago, but the black sponge had not been placed laterally under the skin flap and into the area of the right of the knee, where there was a large cavity. On examination, after removal of his wound VAC, there is a large collection of seropurulent fluid and hematoma in the area starting at the lateral knee and going down to his right lateral compartment of the leg. Clinically, this is a huge infected abscess cavity 10/01/2016 -- patient was admitted between 09/21/2016 and 09/23/2016 with cellulitis of the right lower extremity with a large infected hematoma which was drained at the bedside by surgical services. he was treated with Levaquin and continues to be on Xarelto. the patient is back on a wound VAC which has not been functioning due to the large depth of the hematoma and organized clots. He is  still on Keflex orally. Addendum : I spoke to his surgeon Dr. Satira Mccallum and we discussed options for further draining this large hematoma which I suspect will get infected sooner than later. I expressed the fact that the wound VAC  does not seem to be functioning well and competent enough to drain this large cavity. He will call for him in the office. [id number] -- the patient has hypotension in the sitting position and is a bit lethargic as compared to his normal self. I spoke personally to his nurse at the nursing homes, Ms Maralyn Sago and discussed his management including the fact that he had to have a surgical consultation for the right lower leg, with Dr. Satira Mccallum. Prior to the patient's discharge from the wound center he continues to have hypotension and hence I'm going to talk to the ER physician and have him evaluated in the ER. 10/15/2016 -- the patient was admitted to the hospital between 10/08/2016 and 10/12/2016. He was treated for hypokalemia, hyponatremia, renal insufficiency, sepsis and possible abscess of the right lower leg. he was seen by surgical services and the fluid collection was drained with a Yankauer suction from the knee wound and the opinion of the surgeons was that he would not need any surgical intervention and continue with local wound care. I had personally communicated with Dr. Satira Mccallum regarding this. the patient is still on Xarelto. During the admission he was also seen by Dr. Sampson Goon for infectious disease consult and he recommended continue vancomycin for MRSA and change meropenem to ceftriaxone for this Proteus. He recommended a four-week course of antibiotic therapy and follow-up back with Dr. Sampson Goon in 4 weeks. before his discharge since there was clinical improvement Dr. Sampson Goon converted the treatment to oral clindamycin and oral Keflex for a four-week course of antibiotic therapy. He was to be monitored for C. difficile. 10/29/2016 -- he missed his appointment with Dr. Earlene Plater at the surgical office. He continues to be on oral antibiotics as per Dr. Sampson Goon. 11/16/16 patient's wound on the right knee continues to trouble him. He tells me that the nursing facility  has not changed this dressing in almost a week and his wound is doing somewhat worse today in regard to undermining compared to last week's evaluation. He continues to be frustrated with this. Unfortunately he has tried to switch facilities but was not able to get into any other so far. 11/30/16 Patient's wound appears to be doing well on evaluation today. Fortunately he is tolerating the Gardella, Muhsin T. ([id number]) Iodoform packing which it does sound the facility is performing appropriately. Nonetheless he still continues to have some discomfort there is no evidence of infection today. 12/07/16 on evaluation today patient appears to be doing fairly well in regard to his wound externally although it is somewhat deeper than on previous evaluation. Obviously this seems to be a result of over packing based on what we are seeing here in the office today. I'm happy they are packing the wound but at the same time we meet need to be sure not to overpack which can worsen the depth which appears to be what happened. Fortunately there's no evidence of infection. 12/31/16 on evaluation today patient's knee wound appears to be doing slightly better. He shows no evidence of infection at this point. He still does have undermining towards the 6 o'clock location. Otherwise things seem to be doing well. No fevers, chills, nausea, or vomiting noted at this time. 01/07/17 on evaluation today patient's  right knee wound appears to be doing fairly well there's no evidence of infection. Specifically No fevers, chills, nausea, or vomiting noted at this time. He is not having any discomfort at this point although he does have some dry skin of the right lower extremity he tells me he has been scratching this he appears to have some petechiae likely due to excoriation. Objective Constitutional Well-nourished and well-hydrated in no acute distress. Vitals Time Taken: 11:05 AM, Height: 67 in, Weight: 357 lbs, BMI: 55.9,  Temperature: 98.6 F, Pulse: 73 bpm, Respiratory Rate: 16 breaths/min, Blood Pressure: 133/70 mmHg. Respiratory normal breathing without difficulty. clear to auscultation bilaterally. Cardiovascular regular rate and rhythm with normal S1, S2. Psychiatric this patient is able to make decisions and demonstrates good insight into disease process. Alert and Oriented x 3. pleasant and cooperative. General Notes: Patient continues to have tunneling at the 6 o'clock location but fortunately no purulent discharge noted. It does appear the wound may have been slightly over packing but again that is hard to determine if that's ongoing or just a single event. Nonetheless I did want to encourage the facility to be sure to just lightly pack this wound. Otherwise I'm pleased with how things are progressing. CASHMERE, HARMES ([id number]) Integumentary (Hair, Skin) Wound #1 status is Open. Original cause of wound was Pressure Injury. The wound is located on the Right,Anterior Knee. The wound measures 0.3cm length x 0.5cm width x 0.2cm depth; 0.118cm^2 area and 0.024cm^3 volume. There is Fat Layer (Subcutaneous Tissue) Exposed exposed. There is no tunneling noted, however, there is undermining starting at 6:00 and ending at :00 with a maximum distance of 1.6cm. There is a large amount of serosanguineous drainage noted. The wound margin is flat and intact. There is large (67-100%) red granulation within the wound bed. There is a small (1-33%) amount of necrotic tissue within the wound bed including Adherent Slough. The periwound skin appearance exhibited: Erythema. The periwound skin appearance did not exhibit: Callus, Crepitus, Excoriation, Induration, Rash, Scarring, Dry/Scaly, Maceration, Atrophie Blanche, Cyanosis, Ecchymosis, Hemosiderin Staining, Mottled, Pallor, Rubor. The surrounding wound skin color is noted with erythema which is circumferential. Periwound temperature was noted as No Abnormality.  The periwound has tenderness on palpation. 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 Cleanse wound with mild soap and water Anesthetic: Wound #1 Right,Anterior Knee: Topical Lidocaine 4% cream applied to wound bed prior to debridement - Only in the Wound Clinic Skin Barriers/Peri-Wound Care: Wound #1 Right,Anterior Knee: Moisturizing lotion Primary Wound Dressing: Wound #1 Right,Anterior Knee: Iodoform packing Gauze - pack lightly in wound (ONLY ONE SINGLE LAYER) PLEASE FOLLOW THESE ORDERS Imler, Jomel T. ([id number]) Secondary Dressing: Wound #1 Right,Anterior Knee: ABD pad Dry Gauze Conform/Kerlix Other - tape Dressing Change Frequency: Wound #1 Right,Anterior Knee: Change dressing every day. Other: - as needed Follow-up Appointments: Wound #1 Right,Anterior Knee: Return Appointment in 1 week. Edema Control: Wound #1 Right,Anterior Knee: Elevate legs to the level of the heart and pump ankles as often as possible Additional Orders / Instructions: Wound #1 Right,Anterior Knee: Increase protein intake. Activity as  tolerated Medications-please add to medication list.: Wound #1 Right,Anterior Knee: Other: - Vitamin C, Zinc, MVI General Notes: Will continue with Current wound care measures for the next week. If anything worsens in the interim the nursing facility will contact us for additional recommendations. Otherwise we will see him at that point to see were things stand. Please see above for current wound care  orders. Electronic Signature(s) Signed: 01/07/2017 12:41:32 PM By: Lenda Kelp PA-C Entered By: Lenda Kelp on 01/07/2017 11:29:38 Coberly, Dalbert Garnet ([id number]) -------------------------------------------------------------------------------- SuperBill Details Patient Name: Jerrell Mylar T. Date of Service: 01/07/2017 Medical Record Number: [id number] Patient Account Number: [id number] Date of Birth/Sex: January 18, 1946 (71 y.o. 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 L89.892 Pressure ulcer of other site, stage 2 M70.861 Other soft tissue disorders related to use, overuse and pressure, right lower leg M71.061 Abscess of bursa, right knee Facility Procedures CPT4 Code: [id number] Description: [phone] - WOUND CARE VISIT-LEV 2 EST PT Modifier: Quantity: 1 Physician Procedures CPT4: Description Modifier Quantity Code [id number] - WC PHYS LEVEL 3 - EST PT 1 ICD-10 Description Diagnosis 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

## 2017-01-10 NOTE — Progress Notes (Signed)
[name] ([id number]) Visit Report for 01/07/2017 Arrival Information Details Patient Name: [name], [name]. Date of Service: 01/07/2017 11:00 AM Medical Record Number: [id number] Patient Account Number: [id number] Date of Birth/Sex: 02/10/1946 (71 y.o. Male) Treating RN: Ahmed Prima Primary Care Jawan Chavarria: [name] Dupre Other Clinician: Referring Elzena Muston: [name] Dupre Treating Tassie Pollett/Extender: Melburn Hake, HOYT Weeks in Treatment: 20 Visit Information History Since Last Visit All ordered tests and consults were completed: No Patient Arrived: Wheel Chair Added or deleted any medications: No Arrival Time: 11:05 Any new allergies or adverse reactions: No Accompanied By: self Had a fall or experienced change in No Transfer Assistance: EasyPivot activities of daily living that may affect Patient Lift risk of falls: Patient Identification Verified: Yes Signs or symptoms of abuse/neglect since last No Secondary Verification Process Yes visito Completed: Hospitalized since last visit: No Patient Requires Transmission- No Has Dressing in Place as Prescribed: Yes Based Precautions: Pain Present Now: No Patient Has Alerts: No Electronic Signature(s) Signed: 01/07/2017 2:40:18 PM By: Alric Quan Entered By: Alric Quan on 01/07/2017 11:05:19 Rodriquez, [name] ([id number]) -------------------------------------------------------------------------------- Clinic Level of Care Assessment Details Patient Name: [name]. Date of Service: 01/07/2017 11:00 AM Medical Record Number: [id number] Patient Account Number: [id number] Date of Birth/Sex: [birth year] (71 y.o. Male) Treating RN: Cornell Barman Primary Care Chou Busler: [name] Dupre Other Clinician: Referring Yzabelle Calles: [name] Dupre Treating Ariez Neilan/Extender: Melburn Hake, HOYT Weeks in Treatment: 20 Clinic Level of Care Assessment Items TOOL 4 Quantity Score []  - Use when only an EandM is  performed on FOLLOW-UP visit 0 ASSESSMENTS - Nursing Assessment / Reassessment []  - Reassessment of Co-morbidities (includes updates in patient status) 0 []  - Reassessment of Adherence to Treatment Plan 0 ASSESSMENTS - Wound and Skin Assessment / Reassessment X - Simple Wound Assessment / Reassessment - one wound 1 5 []  - Complex Wound Assessment / Reassessment - multiple wounds 0 []  - Dermatologic / Skin Assessment (not related to wound area) 0 ASSESSMENTS - Focused Assessment []  - Circumferential Edema Measurements - multi extremities 0 []  - Nutritional Assessment / Counseling / Intervention 0 []  - Lower Extremity Assessment (monofilament, tuning fork, pulses) 0 []  - Peripheral Arterial Disease Assessment (using hand held doppler) 0 ASSESSMENTS - Ostomy and/or Continence Assessment and Care []  - Incontinence Assessment and Management 0 []  - Ostomy Care Assessment and Management (repouching, etc.) 0 PROCESS - Coordination of Care X - Simple Patient / Family Education for ongoing care 1 15 []  - Complex (extensive) Patient / Family Education for ongoing care 0 X - Staff obtains Programmer, systems, Records, Test Results / Process Orders 1 10 []  - Staff telephones HHA, Nursing Homes / Clarify orders / etc 0 []  - Routine Transfer to another Facility (non-emergent condition) 0 Desilets, Lory T. ([id number]) []  - Routine Hospital Admission (non-emergent condition) 0 []  - New Admissions / Biomedical engineer / Ordering NPWT, Apligraf, etc. 0 []  - Emergency Hospital Admission (emergent condition) 0 X - Simple Discharge Coordination 1 10 []  - Complex (extensive) Discharge Coordination 0 PROCESS - Special Needs []  - Pediatric / Minor Patient Management 0 []  - Isolation Patient Management 0 []  - Hearing / Language / Visual special needs 0 []  - Assessment of Community assistance (transportation, D/C planning, etc.) 0 []  - Additional assistance / Altered mentation 0 []  - Support Surface(s) Assessment  (bed, cushion, seat, etc.) 0 INTERVENTIONS - Wound Cleansing / Measurement X - Simple Wound Cleansing - one wound 1 5 []  - Complex Wound Cleansing - multiple wounds 0 X -  Wound Imaging (photographs - any number of wounds) 1 5 []  - Wound Tracing (instead of photographs) 0 X - Simple Wound Measurement - one wound 1 5 []  - Complex Wound Measurement - multiple wounds 0 INTERVENTIONS - Wound Dressings []  - Small Wound Dressing one or multiple wounds 0 X - Medium Wound Dressing one or multiple wounds 1 15 []  - Large Wound Dressing one or multiple wounds 0 []  - Application of Medications - topical 0 []  - Application of Medications - injection 0 INTERVENTIONS - Miscellaneous []  - External ear exam 0 Syverson, Fredrich T. ([id number]) []  - Specimen Collection (cultures, biopsies, blood, body fluids, etc.) 0 []  - Specimen(s) / Culture(s) sent or taken to Lab for analysis 0 []  - Patient Transfer (multiple staff / Harrel Lemon Lift / Similar devices) 0 []  - Simple Staple / Suture removal (25 or less) 0 []  - Complex Staple / Suture removal (26 or more) 0 []  - Hypo / Hyperglycemic Management (close monitor of Blood Glucose) 0 []  - Ankle / Brachial Index (ABI) - do not check if billed separately 0 X - Vital Signs 1 5 Has the patient been seen at the hospital within the last three years: Yes Total Score: 75 Level Of Care: New/Established - Level 2 Electronic Signature(s) Signed: 01/07/2017 1:20:34 PM By: Gretta Cool, BSN, RN, CWS, Kim RN, BSN Entered By: Gretta Cool, BSN, RN, CWS, Kim on 01/07/2017 11:23:55 Griffiths, [name] ([id number]) -------------------------------------------------------------------------------- Encounter Discharge Information Details Patient Name: [name] Gathers T. Date of Service: 01/07/2017 11:00 AM Medical Record Number: [id number] Patient Account Number: [id number] Date of Birth/Sex: 26-May-1945 (71 y.o. Male) Treating RN: Ahmed Prima Primary Care Aivy Akter: [name] Dupre Other  Clinician: Referring Christiaan Strebeck: [name] Dupre Treating Burnadette Baskett/Extender: Melburn Hake, HOYT Weeks in Treatment: 20 Encounter Discharge Information Items Discharge Pain Level: 0 Discharge Condition: Stable Ambulatory Status: Wheelchair Discharge Destination: Nursing Home Transportation: Other Accompanied By: self Schedule Follow-up Appointment: Yes Medication Reconciliation completed and provided to Patient/Care No Key Cen: Provided on Clinical Summary of Care: 01/07/2017 Form Type Recipient Paper Patient JE Electronic Signature(s) Signed: 01/08/2017 1:46:33 PM By: Ruthine Dose Entered By: Ruthine Dose on 01/07/2017 11:37:58 Doverspike, [name] ([id number]) -------------------------------------------------------------------------------- Lower Extremity Assessment Details Patient Name: [name] Gathers T. Date of Service: 01/07/2017 11:00 AM Medical Record Number: [id number] Patient Account Number: [id number] Date of Birth/Sex: [birth year] (71 y.o. Male) Treating RN: Ahmed Prima Primary Care Aysha Livecchi: [name] Dupre Other Clinician: Referring Jatoria Kneeland: [name] Dupre Treating Fedrick Cefalu/Extender: Melburn Hake, HOYT Weeks in Treatment: 20 Vascular Assessment Pulses: Dorsalis Pedis Palpable: [Right:Yes] Posterior Tibial Extremity colors, hair growth, and conditions: Extremity Color: [Right:Hyperpigmented] Temperature of Extremity: [Right:Warm] Capillary Refill: [Right:< 3 seconds] Electronic Signature(s) Signed: 01/07/2017 2:40:18 PM By: Alric Quan Entered By: Alric Quan on 01/07/2017 11:14:22 Eblin, [name] ([id number]) -------------------------------------------------------------------------------- Multi Wound Chart Details Patient Name: [name] Gathers T. Date of Service: 01/07/2017 11:00 AM Medical Record Number: [id number] Patient Account Number: [id number] Date of Birth/Sex: 08-24-1945 (71 y.o. Male) Treating RN: Cornell Barman Primary Care Glennie Rodda:  [name] Dupre Other Clinician: Referring Elton Catalano: [name] Dupre Treating Jahmez Bily/Extender: Melburn Hake, HOYT Weeks in Treatment: 20 Vital Signs Height(in): 67 Pulse(bpm): 73 Weight(lbs): 357 Blood Pressure 133/70 (mmHg): Body Mass Index(BMI): 56 Temperature(F): 98.6 Respiratory Rate 16 (breaths/min): Photos: [1:No Photos] [N/A:N/A] Wound Location: [1:Right Knee - Anterior] [N/A:N/A] Wounding Event: [1:Pressure Injury] [N/A:N/A] Primary Etiology: [1:Diabetic Wound/Ulcer of the Lower Extremity] [N/A:N/A] Comorbid History: [1:Anemia, Asthma, Arrhythmia, Congestive Heart Failure, Coronary Artery Disease, Hypertension, Type II Diabetes, Osteoarthritis] [N/A:N/A] Date Acquired: [1:07/23/2016] [N/A:N/A] Weeks of Treatment: [1:20] [  N/A:N/A] Wound Status: [1:Open] [N/A:N/A] Measurements L x W x D 0.3x0.5x0.2 [N/A:N/A] (cm) Area (cm) : [1:0.118] [N/A:N/A] Volume (cm) : [1:0.024] [N/A:N/A] % Reduction in Area: [1:98.50%] [N/A:N/A] % Reduction in Volume: 96.90% [N/A:N/A] Starting Position 1 6 (o'clock): Ending Position 1 (o'clock): Maximum Distance 1 1.6 (cm): Undermining: [1:Yes] [N/A:N/A] Classification: [1:Grade 1] [N/A:N/A] Exudate Amount: [1:Large] [N/A:N/A] Exudate Type: [1:Serosanguineous] [N/A:N/A] Exudate Color: red, brown N/A N/A Wound Margin: Flat and Intact N/A N/A Granulation Amount: Large (67-100%) N/A N/A Granulation Quality: Red N/A N/A Necrotic Amount: Small (1-33%) N/A N/A Exposed Structures: Fat Layer (Subcutaneous N/A N/A Tissue) Exposed: Yes Fascia: No Tendon: No Muscle: No Joint: No Bone: No Epithelialization: None N/A N/A Periwound Skin Texture: Excoriation: No N/A N/A Induration: No Callus: No Crepitus: No Rash: No Scarring: No Periwound Skin Maceration: No N/A N/A Moisture: Dry/Scaly: No Periwound Skin Color: Erythema: Yes N/A N/A Atrophie Blanche: No Cyanosis: No Ecchymosis: No Hemosiderin Staining: No Mottled: No Pallor:  No Rubor: No Erythema Location: Circumferential N/A N/A Temperature: No Abnormality N/A N/A Tenderness on Yes N/A N/A Palpation: Wound Preparation: Ulcer Cleansing: N/A N/A Rinsed/Irrigated with Saline Topical Anesthetic Applied: Other: Lidocaine 4% Treatment Notes Wound #1 (Right, Anterior Knee) 1. Cleansed with: Clean wound with Normal Saline 2. Anesthetic Topical Lidocaine 4% cream to wound bed prior to debridement 4. Dressing Applied: Iodoform packing Gauze Tartt, Jamarcus T. ([id number]) 5. Secondary Dressing Applied ABD Pad Kerlix/Conform 7. Secured with Recruitment consultant) Signed: 01/07/2017 1:20:34 PM By: Gretta Cool, BSN, RN, CWS, Kim RN, BSN Entered By: Gretta Cool, BSN, RN, CWS, Kim on 01/07/2017 13:03:46 Rossano, [name] ([id number]) -------------------------------------------------------------------------------- Multi-Disciplinary Care Plan Details Patient Name: [name] Gathers T. Date of Service: 01/07/2017 11:00 AM Medical Record Number: [id number] Patient Account Number: [id number] Date of Birth/Sex: Jan 11, 1946 (71 y.o. Male) Treating RN: Cornell Barman Primary Care Orrie Schubert: [name] Dupre Other Clinician: Referring Vila Dory: [name] Dupre Treating Torren Maffeo/Extender: Melburn Hake, HOYT Weeks in Treatment: 20 Active Inactive ` Orientation to the Wound Care Program Nursing Diagnoses: Knowledge deficit related to the wound healing center program Goals: Patient/caregiver will verbalize understanding of the [place] Program Date Initiated: 08/14/2016 Target Resolution Date: 12/14/2016 Goal Status: Active Interventions: Provide education on orientation to the wound center Notes: ` Pressure Nursing Diagnoses: Knowledge deficit related to causes and risk factors for pressure ulcer development Knowledge deficit related to management of pressures ulcers Potential for impaired tissue integrity related to pressure, friction, moisture, and  shear Goals: Patient will remain free from development of additional pressure ulcers Date Initiated: 08/14/2016 Target Resolution Date: 12/14/2016 Goal Status: Active Patient will remain free of pressure ulcers Date Initiated: 08/14/2016 Target Resolution Date: 12/14/2016 Goal Status: Active Patient/caregiver will verbalize risk factors for pressure ulcer development Date Initiated: 08/14/2016 Target Resolution Date: 12/14/2016 Goal Status: Active Patient/caregiver will verbalize understanding of pressure ulcer management Date Initiated: 08/14/2016 Target Resolution Date: 12/14/2016 Goal Status: Active COPPER, BASNETT ([id number]) Interventions: Assess: immobility, friction, shearing, incontinence upon admission and as needed Assess offloading mechanisms upon admission and as needed Assess potential for pressure ulcer upon admission and as needed Provide education on pressure ulcers Treatment Activities: Patient referred for seating evaluation to ensure proper offloading : 08/14/2016 Pressure reduction/relief device ordered : 08/14/2016 Notes: ` Wound/Skin Impairment Nursing Diagnoses: Impaired tissue integrity Knowledge deficit related to ulceration/compromised skin integrity Goals: Patient/caregiver will verbalize understanding of skin care regimen Date Initiated: 08/14/2016 Target Resolution Date: 12/14/2016 Goal Status: Active Ulcer/skin breakdown will have a volume reduction of 30% by week 4 Date Initiated:  08/14/2016 Target Resolution Date: 12/14/2016 Goal Status: Active Ulcer/skin breakdown will have a volume reduction of 50% by week 8 Date Initiated: 08/14/2016 Target Resolution Date: 12/14/2016 Goal Status: Active Ulcer/skin breakdown will have a volume reduction of 80% by week 12 Date Initiated: 08/14/2016 Target Resolution Date: 12/14/2016 Goal Status: Active Ulcer/skin breakdown will heal within 14 weeks Date Initiated: 08/14/2016 Target Resolution Date: 12/14/2016 Goal  Status: Active Interventions: Assess patient/caregiver ability to obtain necessary supplies Assess patient/caregiver ability to perform ulcer/skin care regimen upon admission and as needed Assess ulceration(s) every visit Provide education on ulcer and skin care Treatment Activities: Referred to DME Taler Kushner for dressing supplies : 08/14/2016 Skin care regimen initiated : 08/14/2016 AZRIEL, JAKOB ([id number]) Topical wound management initiated : 08/14/2016 Notes: Electronic Signature(s) Signed: 01/07/2017 1:20:34 PM By: Gretta Cool, BSN, RN, CWS, Kim RN, BSN Entered By: Gretta Cool, BSN, RN, CWS, Kim on 01/07/2017 11:22:26 Mittelstaedt, [name] ([id number]) -------------------------------------------------------------------------------- Pain Assessment Details Patient Name: [name] Gathers T. Date of Service: 01/07/2017 11:00 AM Medical Record Number: [id number] Patient Account Number: [id number] Date of Birth/Sex: [birth year] (71 y.o. Male) Treating RN: Ahmed Prima Primary Care Kanan Sobek: [name] Dupre Other Clinician: Referring Elodia Haviland: [name] Dupre Treating Aaralyn Kil/Extender: Melburn Hake, HOYT Weeks in Treatment: 20 Active Problems Location of Pain Severity and Description of Pain Patient Has Paino No Site Locations With Dressing Change: No Pain Management and Medication Current Pain Management: Electronic Signature(s) Signed: 01/07/2017 2:40:18 PM By: Alric Quan Entered By: Alric Quan on 01/07/2017 11:05:35 Popoff, [name] ([id number]) -------------------------------------------------------------------------------- Patient/Caregiver Education Details Patient Name: [name]. Date of Service: 01/07/2017 11:00 AM Medical Record Number: [id number] Patient Account Number: [id number] Date of Birth/Gender: 1945/09/30 (71 y.o. Male) Treating RN: Ahmed Prima Primary Care Physician: [name] Dupre Other Clinician: Referring Physician: [name] Dupre Treating  Physician/Extender: Melburn Hake, HOYT Weeks in Treatment: 20 Education Assessment Education Provided To: Patient Education Topics Provided Wound/Skin Impairment: Handouts: Other: change dressing as ordered Methods: Demonstration, Explain/Verbal Responses: State content correctly Electronic Signature(s) Signed: 01/07/2017 2:40:18 PM By: Alric Quan Entered By: Alric Quan on 01/07/2017 11:30:55 Khachatryan, [name] ([id number]) -------------------------------------------------------------------------------- Wound Assessment Details Patient Name: [name] Gathers T. Date of Service: 01/07/2017 11:00 AM Medical Record Number: [id number] Patient Account Number: [id number] Date of Birth/Sex: 02/10/1946 (71 y.o. Male) Treating RN: Ahmed Prima Primary Care Elenie Coven: [name] Dupre Other Clinician: Referring Icela Glymph: [name] Dupre Treating Neaveh Belanger/Extender: Melburn Hake, HOYT Weeks in Treatment: 20 Wound Status Wound Number: 1 Primary Diabetic Wound/Ulcer of the Lower Etiology: Extremity Wound Location: Right Knee - Anterior Wound Open Wounding Event: Pressure Injury Status: Date Acquired: 07/23/2016 Comorbid Anemia, Asthma, Arrhythmia, Weeks Of Treatment: 20 History: Congestive Heart Failure, Coronary Clustered Wound: No Artery Disease, Hypertension, Type II Diabetes, Osteoarthritis Photos Photo Uploaded By: Alric Quan on 01/07/2017 13:54:40 Wound Measurements Length: (cm) 0.3 % Reduction in Width: (cm) 0.5 % Reduction in Depth: (cm) 0.2 Epithelializati Area: (cm) 0.118 Tunneling: Volume: (cm) 0.024 Undermining: Starting Pos Maximum Dist Area: 98.5% Volume: 96.9% on: None No Yes ition (o'clock): 6 ance: (cm) 1.6 Wound Description Classification: Grade 1 Foul Odor Afte Wound Margin: Flat and Intact Slough/Fibrino Exudate Amount: Large Exudate Type: Serosanguineous Exudate Color: red, brown r Cleansing: No No Wound Bed Meyer, Salim T.  ([id number]) Granulation Amount: Large (67-100%) Exposed Structure Granulation Quality: Red Fascia Exposed: No Necrotic Amount: Small (1-33%) Fat Layer (Subcutaneous Tissue) Exposed: Yes Necrotic Quality: Adherent Slough Tendon Exposed: No Muscle Exposed: No Joint Exposed: No Bone Exposed: No Periwound Skin Texture Texture Color No Abnormalities Noted: No No  Abnormalities Noted: No Callus: No Atrophie Blanche: No Crepitus: No Cyanosis: No Excoriation: No Ecchymosis: No Induration: No Erythema: Yes Rash: No Erythema Location: Circumferential Scarring: No Hemosiderin Staining: No Mottled: No Moisture Pallor: No No Abnormalities Noted: No Rubor: No Dry / Scaly: No Maceration: No Temperature / Pain Temperature: No Abnormality Tenderness on Palpation: Yes Wound Preparation Ulcer Cleansing: Rinsed/Irrigated with Saline Topical Anesthetic Applied: Other: Lidocaine 4%, Treatment Notes Wound #1 (Right, Anterior Knee) 1. Cleansed with: Clean wound with Normal Saline 2. Anesthetic Topical Lidocaine 4% cream to wound bed prior to debridement 4. Dressing Applied: Iodoform packing Gauze 5. Secondary Dressing Applied ABD Pad Kerlix/Conform 7. Secured with Recruitment consultant) Signed: 01/07/2017 2:40:18 PM By: Alric Quan Entered By: Alric Quan on 01/07/2017 11:12:31 Goble, [name] ([id number]) [place], [name] ([id number]) -------------------------------------------------------------------------------- Vitals Details Patient Name: [name] Gathers T. Date of Service: 01/07/2017 11:00 AM Medical Record Number: [id number] Patient Account Number: [id number] Date of Birth/Sex: 1945-09-28 (71 y.o. Male) Treating RN: Ahmed Prima Primary Care Kalina Morabito: [name] Dupre Other Clinician: Referring Sinai Mahany: [name] Dupre Treating Quinnten Calvin/Extender: Melburn Hake, HOYT Weeks in Treatment: 20 Vital Signs Time Taken: 11:05 Temperature (F):  98.6 Height (in): 67 Pulse (bpm): 73 Weight (lbs): 357 Respiratory Rate (breaths/min): 16 Body Mass Index (BMI): 55.9 Blood Pressure (mmHg): 133/70 Reference Range: 80 - 120 mg / dl Electronic Signature(s) Signed: 01/07/2017 2:40:18 PM By: Alric Quan Entered By: Alric Quan on 01/07/2017 11:06:27

## 2017-01-15 ENCOUNTER — Encounter: Payer: Medicare Other | Admitting: Surgery

## 2017-01-15 DIAGNOSIS — E11622 Type 2 diabetes mellitus with other skin ulcer: Secondary | ICD-10-CM | POA: Diagnosis not present

## 2017-01-17 NOTE — Progress Notes (Signed)
[name], [name] ([id number]) Visit Report for 01/15/2017 Arrival Information Details Patient Name: [name], [name]. Date of Service: 01/15/2017 10:15 AM Medical Record Number: [id number] Patient Account Number: [id number] Date of Birth/Sex: 1945/08/22 (71 y.o. Male) Treating RN: Montey Hora Primary Care Refoel Palladino: [name] Dupre Other Clinician: Referring Bren Steers: [name] Dupre Treating Paz Winsett/Extender: Frann Rider in Treatment: 22 Visit Information History Since Last Visit Added or deleted any medications: No Patient Arrived: Wheel Chair Any new allergies or adverse reactions: No Arrival Time: 10:29 Had a fall or experienced change in No activities of daily living that may affect Accompanied By: self risk of falls: Transfer Assistance: Manual Signs or symptoms of abuse/neglect since last No Patient Identification Verified: Yes visito Secondary Verification Process Yes Hospitalized since last visit: No Completed: Has Dressing in Place as Prescribed: Yes Patient Requires Transmission-Based No Pain Present Now: Yes Precautions: Patient Has Alerts: No Electronic Signature(s) Signed: 01/15/2017 5:26:25 PM By: Montey Hora Entered By: Montey Hora on 01/15/2017 10:29:48 Kassem, [name] ([id number]) -------------------------------------------------------------------------------- Clinic Level of Care Assessment Details Patient Name: [name]. Date of Service: 01/15/2017 10:15 AM Medical Record Number: [id number] Patient Account Number: [id number] Date of Birth/Sex: January 28, 1946 (71 y.o. Male) Treating RN: Montey Hora Primary Care Gurshan Settlemire: [name] Dupre Other Clinician: Referring Havilah Topor: [name] Dupre Treating Clovis Mankins/Extender: Frann Rider in Treatment: 22 Clinic Level of Care Assessment Items TOOL 4 Quantity Score []  - Use when only an EandM is performed on FOLLOW-UP visit 0 ASSESSMENTS - Nursing Assessment / Reassessment X  - Reassessment of Co-morbidities (includes updates in patient status) 1 10 X - Reassessment of Adherence to Treatment Plan 1 5 ASSESSMENTS - Wound and Skin Assessment / Reassessment X - Simple Wound Assessment / Reassessment - one wound 1 5 []  - Complex Wound Assessment / Reassessment - multiple wounds 0 []  - Dermatologic / Skin Assessment (not related to wound area) 0 ASSESSMENTS - Focused Assessment []  - Circumferential Edema Measurements - multi extremities 0 []  - Nutritional Assessment / Counseling / Intervention 0 X - Lower Extremity Assessment (monofilament, tuning fork, pulses) 1 5 []  - Peripheral Arterial Disease Assessment (using hand held doppler) 0 ASSESSMENTS - Ostomy and/or Continence Assessment and Care []  - Incontinence Assessment and Management 0 []  - Ostomy Care Assessment and Management (repouching, etc.) 0 PROCESS - Coordination of Care X - Simple Patient / Family Education for ongoing care 1 15 []  - Complex (extensive) Patient / Family Education for ongoing care 0 []  - Staff obtains Programmer, systems, Records, Test Results / Process Orders 0 []  - Staff telephones HHA, Nursing Homes / Clarify orders / etc 0 []  - Routine Transfer to another Facility (non-emergent condition) 0 Folger, Maicol T. ([id number]) []  - Routine Hospital Admission (non-emergent condition) 0 []  - New Admissions / Biomedical engineer / Ordering NPWT, Apligraf, etc. 0 []  - Emergency Hospital Admission (emergent condition) 0 X - Simple Discharge Coordination 1 10 []  - Complex (extensive) Discharge Coordination 0 PROCESS - Special Needs []  - Pediatric / Minor Patient Management 0 []  - Isolation Patient Management 0 []  - Hearing / Language / Visual special needs 0 []  - Assessment of Community assistance (transportation, D/C planning, etc.) 0 []  - Additional assistance / Altered mentation 0 []  - Support Surface(s) Assessment (bed, cushion, seat, etc.) 0 INTERVENTIONS - Wound Cleansing / Measurement X -  Simple Wound Cleansing - one wound 1 5 []  - Complex Wound Cleansing - multiple wounds 0 X - Wound Imaging (photographs - any number of wounds) 1 5 []  -  Wound Tracing (instead of photographs) 0 X - Simple Wound Measurement - one wound 1 5 []  - Complex Wound Measurement - multiple wounds 0 INTERVENTIONS - Wound Dressings X - Small Wound Dressing one or multiple wounds 1 10 []  - Medium Wound Dressing one or multiple wounds 0 []  - Large Wound Dressing one or multiple wounds 0 []  - Application of Medications - topical 0 []  - Application of Medications - injection 0 INTERVENTIONS - Miscellaneous []  - External ear exam 0 Cammarata, Matthews T. ([id number]) []  - Specimen Collection (cultures, biopsies, blood, body fluids, etc.) 0 []  - Specimen(s) / Culture(s) sent or taken to Lab for analysis 0 []  - Patient Transfer (multiple staff / Harrel Lemon Lift / Similar devices) 0 []  - Simple Staple / Suture removal (25 or less) 0 []  - Complex Staple / Suture removal (26 or more) 0 []  - Hypo / Hyperglycemic Management (close monitor of Blood Glucose) 0 []  - Ankle / Brachial Index (ABI) - do not check if billed separately 0 X - Vital Signs 1 5 Has the patient been seen at the hospital within the last three years: Yes Total Score: 80 Level Of Care: New/Established - Level 3 Electronic Signature(s) Signed: 01/15/2017 5:26:25 PM By: Montey Hora Entered By: Montey Hora on 01/15/2017 13:09:26 Kuznia, [name] ([id number]) -------------------------------------------------------------------------------- Encounter Discharge Information Details Patient Name: [name] Gathers T. Date of Service: 01/15/2017 10:15 AM Medical Record Number: [id number] Patient Account Number: [id number] Date of Birth/Sex: 10-07-1945 (71 y.o. Male) Treating RN: Montey Hora Primary Care Cregg Jutte: [name] Dupre Other Clinician: Referring Gladies Sofranko: [name] Dupre Treating Ronrico Dupin/Extender: Frann Rider in Treatment:  22 Encounter Discharge Information Items Discharge Pain Level: 0 Discharge Condition: Stable Ambulatory Status: Wheelchair Discharge Destination: Nursing Home Transportation: Private Auto Accompanied By: self Schedule Follow-up Appointment: Yes Medication Reconciliation completed and provided to Patient/Care No Alana Dayton: Provided on Clinical Summary of Care: 01/15/2017 Form Type Recipient Paper Patient JE Electronic Signature(s) Signed: 01/15/2017 1:10:29 PM By: Montey Hora Entered By: Montey Hora on 01/15/2017 13:10:29 Oyervides, [name] ([id number]) -------------------------------------------------------------------------------- Lower Extremity Assessment Details Patient Name: [name] Gathers T. Date of Service: 01/15/2017 10:15 AM Medical Record Number: [id number] Patient Account Number: [id number] Date of Birth/Sex: April 23, 1946 (71 y.o. Male) Treating RN: Montey Hora Primary Care Emmanuela Ghazi: [name] Dupre Other Clinician: Referring Carletta Feasel: [name] Dupre Treating Berwyn Bigley/Extender: Frann Rider in Treatment: 22 Vascular Assessment Pulses: Dorsalis Pedis Palpable: [Right:Yes] Posterior Tibial Extremity colors, hair growth, and conditions: Extremity Color: [Right:Hyperpigmented] Hair Growth on Extremity: [Right:No] Temperature of Extremity: [Right:Warm] Capillary Refill: [Right:< 3 seconds] Electronic Signature(s) Signed: 01/15/2017 5:26:25 PM By: Montey Hora Entered By: Montey Hora on 01/15/2017 10:39:48 Chronister, [name] ([id number]) -------------------------------------------------------------------------------- Multi Wound Chart Details Patient Name: [name] Gathers T. Date of Service: 01/15/2017 10:15 AM Medical Record Number: [id number] Patient Account Number: [id number] Date of Birth/Sex: [birth year] (71 y.o. Male) Treating RN: Montey Hora Primary Care Mikeria Valin: [name] Dupre Other Clinician: Referring Caterina Racine: [name] Dupre Treating Dhruv Christina/Extender: Frann Rider in Treatment: 22 Vital Signs Height(in): 67 Pulse(bpm): 89 Weight(lbs): 357 Blood Pressure 96/57 (mmHg): Body Mass Index(BMI): 56 Temperature(F): 98.2 Respiratory Rate 16 (breaths/min): Photos: [1:No Photos] [N/A:N/A] Wound Location: [1:Right Knee - Anterior] [N/A:N/A] Wounding Event: [1:Pressure Injury] [N/A:N/A] Primary Etiology: [1:Diabetic Wound/Ulcer of the Lower Extremity] [N/A:N/A] Comorbid History: [1:Anemia, Asthma, Arrhythmia, Congestive Heart Failure, Coronary Artery Disease, Hypertension, Type II Diabetes, Osteoarthritis] [N/A:N/A] Date Acquired: [1:07/23/2016] [N/A:N/A] Weeks of Treatment: [1:22] [N/A:N/A] Wound Status: [1:Open] [N/A:N/A] Measurements L x W x D 0.2x0.5x0.2 [N/A:N/A] (cm) Area (cm) : [  1:0.079] [N/A:N/A] Volume (cm) : [1:0.016] [N/A:N/A] % Reduction in Area: [1:99.00%] [N/A:N/A] % Reduction in Volume: 98.00% [N/A:N/A] Classification: [1:Grade 1] [N/A:N/A] Exudate Amount: [1:Large] [N/A:N/A] Exudate Type: [1:Serosanguineous] [N/A:N/A] Exudate Color: [1:red, brown] [N/A:N/A] Wound Margin: [1:Flat and Intact] [N/A:N/A] Granulation Amount: [1:Large (67-100%)] [N/A:N/A] Granulation Quality: [1:Red] [N/A:N/A] Necrotic Amount: [1:None Present (0%)] [N/A:N/A] Exposed Structures: [1:Fat Layer (Subcutaneous Tissue) Exposed: Yes] [N/A:N/A] Fascia: No Tendon: No Muscle: No Joint: No Bone: No Epithelialization: None N/A N/A Periwound Skin Texture: Excoriation: No N/A N/A Induration: No Callus: No Crepitus: No Rash: No Scarring: No Periwound Skin Maceration: No N/A N/A Moisture: Dry/Scaly: No Periwound Skin Color: Erythema: Yes N/A N/A Atrophie Blanche: No Cyanosis: No Ecchymosis: No Hemosiderin Staining: No Mottled: No Pallor: No Rubor: No Erythema Location: Circumferential N/A N/A Temperature: No Abnormality N/A N/A Tenderness on Yes N/A N/A Palpation: Wound Preparation: Ulcer  Cleansing: N/A N/A Rinsed/Irrigated with Saline Topical Anesthetic Applied: Other: Lidocaine 4% Treatment Notes Electronic Signature(s) Signed: 01/15/2017 11:54:07 AM By: Christin Fudge MD, FACS Entered By: Christin Fudge on 01/15/2017 10:50:16 Remsen, [name] ([id number]) -------------------------------------------------------------------------------- [place] Details Patient Name: [name] Gathers T. Date of Service: 01/15/2017 10:15 AM Medical Record Number: [id number] Patient Account Number: [id number] Date of Birth/Sex: [birth year] (70 y.o. Male) Treating RN: Montey Hora Primary Care Markitta Ausburn: [name] Dupre Other Clinician: Referring Catherene Kaleta: [name] Dupre Treating Kateena Degroote/Extender: Frann Rider in Treatment: 22 Active Inactive ` Orientation to the Wound Care Program Nursing Diagnoses: Knowledge deficit related to the wound healing center program Goals: Patient/caregiver will verbalize understanding of the [place] Program Date Initiated: 08/14/2016 Target Resolution Date: 12/14/2016 Goal Status: Active Interventions: Provide education on orientation to the wound center Notes: ` Pressure Nursing Diagnoses: Knowledge deficit related to causes and risk factors for pressure ulcer development Knowledge deficit related to management of pressures ulcers Potential for impaired tissue integrity related to pressure, friction, moisture, and shear Goals: Patient will remain free from development of additional pressure ulcers Date Initiated: 08/14/2016 Target Resolution Date: 12/14/2016 Goal Status: Active Patient will remain free of pressure ulcers Date Initiated: 08/14/2016 Target Resolution Date: 12/14/2016 Goal Status: Active Patient/caregiver will verbalize risk factors for pressure ulcer development Date Initiated: 08/14/2016 Target Resolution Date: 12/14/2016 Goal Status: Active Patient/caregiver will verbalize understanding  of pressure ulcer management Date Initiated: 08/14/2016 Target Resolution Date: 12/14/2016 Goal Status: Active DOMINICK, MORELLA ([id number]) Interventions: Assess: immobility, friction, shearing, incontinence upon admission and as needed Assess offloading mechanisms upon admission and as needed Assess potential for pressure ulcer upon admission and as needed Provide education on pressure ulcers Treatment Activities: Patient referred for seating evaluation to ensure proper offloading : 08/14/2016 Pressure reduction/relief device ordered : 08/14/2016 Notes: ` Wound/Skin Impairment Nursing Diagnoses: Impaired tissue integrity Knowledge deficit related to ulceration/compromised skin integrity Goals: Patient/caregiver will verbalize understanding of skin care regimen Date Initiated: 08/14/2016 Target Resolution Date: 12/14/2016 Goal Status: Active Ulcer/skin breakdown will have a volume reduction of 30% by week 4 Date Initiated: 08/14/2016 Target Resolution Date: 12/14/2016 Goal Status: Active Ulcer/skin breakdown will have a volume reduction of 50% by week 8 Date Initiated: 08/14/2016 Target Resolution Date: 12/14/2016 Goal Status: Active Ulcer/skin breakdown will have a volume reduction of 80% by week 12 Date Initiated: 08/14/2016 Target Resolution Date: 12/14/2016 Goal Status: Active Ulcer/skin breakdown will heal within 14 weeks Date Initiated: 08/14/2016 Target Resolution Date: 12/14/2016 Goal Status: Active Interventions: Assess patient/caregiver ability to obtain necessary supplies Assess patient/caregiver ability to perform ulcer/skin care regimen upon admission and as needed Assess ulceration(s)  every visit Provide education on ulcer and skin care Treatment Activities: Referred to DME Mima Cranmore for dressing supplies : 08/14/2016 Skin care regimen initiated : 08/14/2016 ORLEN, LEEDY ([id number]) Topical wound management initiated : 08/14/2016 Notes: Electronic  Signature(s) Signed: 01/15/2017 5:26:25 PM By: Montey Hora Entered By: Montey Hora on 01/15/2017 10:42:28 Muraoka, [name] ([id number]) -------------------------------------------------------------------------------- Pain Assessment Details Patient Name: [name] Gathers T. Date of Service: 01/15/2017 10:15 AM Medical Record Number: [id number] Patient Account Number: [id number] Date of Birth/Sex: Nov 20, 1945 (71 y.o. Male) Treating RN: Montey Hora Primary Care Canaan Holzer: [name] Dupre Other Clinician: Referring Magdalyn Arenivas: [name] Dupre Treating Daylan Boggess/Extender: Frann Rider in Treatment: 22 Active Problems Location of Pain Severity and Description of Pain Patient Has Paino Yes Site Locations Pain Location: Generalized Pain With Dressing Change: No Duration of the Pain. Constant / Intermittento Constant Pain Management and Medication Current Pain Management: Notes Topical or injectable lidocaine is offered to patient for acute pain when surgical debridement is performed. If needed, Patient is instructed to use over the counter pain medication for the following 24-48 hours after debridement. Wound care MDs do not prescribed pain medications. Patient has chronic pain or uncontrolled pain. Patient has been instructed to make an appointment with their Primary Care Physician for pain management. Electronic Signature(s) Signed: 01/15/2017 5:26:25 PM By: Montey Hora Entered By: Montey Hora on 01/15/2017 10:30:04 Batt, [name] ([id number]) -------------------------------------------------------------------------------- Patient/Caregiver Education Details Patient Name: [name]. Date of Service: 01/15/2017 10:15 AM Medical Record Number: [id number] Patient Account Number: [id number] Date of Birth/Gender: [birth year] (71 y.o. Male) Treating RN: Montey Hora Primary Care Physician: [name] Dupre Other Clinician: Referring Physician: [name] Dupre Treating Physician/Extender: Frann Rider in Treatment: 22 Education Assessment Education Provided To: Caregiver SNF nurses Education Topics Provided Wound/Skin Impairment: Handouts: Other: wound care orders Methods: Pharmacist, hospital) Signed: 01/15/2017 5:26:25 PM By: Montey Hora Entered By: Montey Hora on 01/15/2017 13:10:50 Lema, [name] ([id number]) -------------------------------------------------------------------------------- Wound Assessment Details Patient Name: [name] Gathers T. Date of Service: 01/15/2017 10:15 AM Medical Record Number: [id number] Patient Account Number: [id number] Date of Birth/Sex: 20-Nov-1945 (71 y.o. Male) Treating RN: Montey Hora Primary Care Ahmet Schank: [name] Dupre Other Clinician: Referring Tempest Frankland: [name] Dupre Treating Yukie Bergeron/Extender: Frann Rider in Treatment: 22 Wound Status Wound Number: 1 Primary Diabetic Wound/Ulcer of the Lower Etiology: Extremity Wound Location: Right Knee - Anterior Wound Open Wounding Event: Pressure Injury Status: Date Acquired: 07/23/2016 Comorbid Anemia, Asthma, Arrhythmia, Weeks Of Treatment: 22 History: Congestive Heart Failure, Coronary Clustered Wound: No Artery Disease, Hypertension, Type II Diabetes, Osteoarthritis Photos Photo Uploaded By: Montey Hora on 01/15/2017 11:57:50 Wound Measurements Length: (cm) 0.2 Width: (cm) 0.5 Depth: (cm) 0.2 Area: (cm) 0.079 Volume: (cm) 0.016 % Reduction in Area: 99% % Reduction in Volume: 98% Epithelialization: None Tunneling: No Undermining: No Wound Description Classification: Grade 1 Foul Odor After Wound Margin: Flat and Intact Slough/Fibrino Exudate Amount: Large Exudate Type: Serosanguineous Exudate Color: red, brown Cleansing: No No Wound Bed Granulation Amount: Large (67-100%) Exposed Structure Granulation Quality: Red Fascia Exposed: No Necrotic Amount: None Present (0%) Fat  Layer (Subcutaneous Tissue) Exposed: Yes Kimmer, Alyjah T. ([id number]) Tendon Exposed: No Muscle Exposed: No Joint Exposed: No Bone Exposed: No Periwound Skin Texture Texture Color No Abnormalities Noted: No No Abnormalities Noted: No Callus: No Atrophie Blanche: No Crepitus: No Cyanosis: No Excoriation: No Ecchymosis: No Induration: No Erythema: Yes Rash: No Erythema Location: Circumferential Scarring: No Hemosiderin Staining: No Mottled: No Moisture Pallor: No No Abnormalities Noted: No Rubor: No Dry /  Scaly: No Maceration: No Temperature / Pain Temperature: No Abnormality Tenderness on Palpation: Yes Wound Preparation Ulcer Cleansing: Rinsed/Irrigated with Saline Topical Anesthetic Applied: Other: Lidocaine 4%, Treatment Notes Wound #1 (Right, Anterior Knee) 1. Cleansed with: Clean wound with Normal Saline 2. Anesthetic Topical Lidocaine 4% cream to wound bed prior to debridement 4. Dressing Applied: Dry Gauze 5. Secondary Dressing Applied Bordered Foam Dressing Electronic Signature(s) Signed: 01/15/2017 5:26:25 PM By: Montey Hora Entered By: Montey Hora on 01/15/2017 10:38:58 Cadieux, [name] ([id number]) -------------------------------------------------------------------------------- Vitals Details Patient Name: [name] Gathers T. Date of Service: 01/15/2017 10:15 AM Medical Record Number: [id number] Patient Account Number: [id number] Date of Birth/Sex: 1945-10-17 (71 y.o. Male) Treating RN: Montey Hora Primary Care Luiza Carranco: [name] Dupre Other Clinician: Referring Natisha Trzcinski: [name] Dupre Treating Ebelin Dillehay/Extender: Frann Rider in Treatment: 22 Vital Signs Time Taken: 10:30 Temperature (F): 98.2 Height (in): 67 Pulse (bpm): 89 Weight (lbs): 357 Respiratory Rate (breaths/min): 16 Body Mass Index (BMI): 55.9 Blood Pressure (mmHg): 96/57 Reference Range: 80 - 120 mg / dl Electronic Signature(s) Signed: 01/15/2017 5:26:25  PM By: Montey Hora Entered By: Montey Hora on 01/15/2017 10:32:19

## 2017-01-17 NOTE — Progress Notes (Signed)
FAHD, GALEA ([id number]) Visit Report for 8/24/[name] Chief Complaint Document Details Patient Name: [name], [name]. Date of Service: 8/24/[name] 10:15 AM Medical Record Number: [id number] Patient Account Number: [id number] Date of Birth/Sex: [birth year] (71 y.o. Male) Treating RN: Montey Hora Primary Care Provider: [name] Dupre Other Clinician: Referring Provider: [name] Dupre Treating Provider/Extender: Frann Rider in Treatment: 22 Information Obtained from: Patient Chief Complaint Patient is at the clinic for treatment of an open pressure ulcer to the area of the right knee Electronic Signature(s) Signed: 8/24/[name] 11:54:07 AM By: Christin Fudge MD, FACS Entered By: Christin Fudge on 08/24/[name] 10:50:23 [name], [name] ([id number]) -------------------------------------------------------------------------------- HPI Details Patient Name: [name]. Date of Service: 8/24/[name] 10:15 AM Medical Record Number: [id number] Patient Account Number: [id number] Date of Birth/Sex: 08-25-1945 (71 y.o. Male) Treating RN: Montey Hora Primary Care Provider: [name] Dupre Other Clinician: Referring Provider: [name] Dupre Treating Provider/Extender: Frann Rider in Treatment: 22 History of Present Illness Location: right knee swelling with skin ulceration Quality: Patient reports experiencing a dull pain to affected area(s). Severity: Patient states wound are getting worse. Duration: Patient has had the wound for <4 weeks prior to presenting for treatment Timing: Pain in wound is Intermittent (comes and goes Context: The wound occurred when the patient had a cast placed over his right lower extremity and with the cast was removed he had a swelling of the knee with problems with his skin Modifying Factors: Other treatment(s) tried include:orthopedics Associated Signs and Symptoms: swelling of both lower extremities with large amount of swelling of  the knee HPI Description: 71 year old gentleman who has been living in an independent living facility for long-term care has had recurrent falls and recently had a large hematoma on the right knee. Known to be on aspirin and Plavix for history of CABG and was seen in the ER at the end of February after a fall. past medical history of arthritis, congestive heart failure, diabetes mellitus, hypertension, status post CABG o2, status post cholecystectomy, hernia repair and kidney surgery. recent workup for the fall showed negative clinical findings in his extremities and it was thought that he was falling due to deconditioning. a x-ray showed a right fibular neck fracture and was placed in a splint. he was asked to be seen by orthopedic surgeon. the patient cannot tell me which orthopedic doctor had seen him and I'm not able to get any notes from [place], regarding his orthopedic care. The patient says twice during this last week he has had bloody fluid shooting out of his knee and has drained all over his boot and the flow. 03/30/[name] -- he was seen by the nurse practitioner at the facility and and distend she aspirated about 60 mL of hemorrhagic fluid from his right knee. 04/09/[name] -- since I saw the patient last he was admitted to the hospital at [facility] between 04/01/[name] 2 04/05/[name] and was treated with a pulmonary embolism. he had EKOS complete, was started on Xarelto, and also treated for chronic diastolic congestive heart failure. he was asked to follow-up with the wound center and continue management locally. 04/16/[name] -- he has an appointment to see his orthopedic surgeon later this week. 04/23/[name] -- he did see the orthopedic service and they have asked him to continue with physical therapy. They discontinued the boot and the x-ray done showed a healed fracture. They said it was okay to use a wound VAC if needed. Addendum -- we got the note from the Orthopedic office where he was  seen by [facility] orthopedics on [facility] in [place] and was seen by the PA Carlynn Spry. -- The x-ray which was done was reviewed and there is good healing of the fracture and from the orthopedic point of view he has healed well and they have asked him to continue with good control of his diabetes and management by the wound center including using a wound VAC if required. [name], [name] ([id number]) 04/30/[name] -- the patient's wound VAC had been applied 2 days ago, but the black sponge had not been placed laterally under the skin flap and into the area of the right of the knee, where there was a large cavity. On examination, after removal of his wound VAC, there is a large collection of seropurulent fluid and hematoma in the area starting at the lateral knee and going down to his right lateral compartment of the leg. Clinically, this is a huge infected abscess cavity 05/10/[name] -- patient was admitted between 04/30/[name] and 05/02/[name] with cellulitis of the right lower extremity with a large infected hematoma which was drained at the bedside by surgical services. he was treated with Levaquin and continues to be on Xarelto. the patient is back on a wound VAC which has not been functioning due to the large depth of the hematoma and organized clots. He is still on Keflex orally. Addendum : I spoke to his surgeon Dr. Tama High and we discussed options for further draining this large hematoma which I suspect will get infected sooner than later. I expressed the fact that the wound VAC does not seem to be functioning well and competent enough to drain this large cavity. He will call for him in the office. [name] -- the patient has hypotension in the sitting position and is a bit lethargic as compared to his normal self. I spoke personally to his nurse at the nursing homes, Ms Judson Roch and discussed his management including the fact that he had to have a surgical consultation for the  right lower leg, with Dr. Tama High. Prior to the patient's discharge from the wound center he continues to have hypotension and hence I'm going to talk to the ER physician and have him evaluated in the ER. 05/24/[name] -- the patient was admitted to the hospital between 05/17/[name] and 05/21/[name]. He was treated for hypokalemia, hyponatremia, renal insufficiency, sepsis and possible abscess of the right lower leg. he was seen by surgical services and the fluid collection was drained with a Yankauer suction from the knee wound and the opinion of the surgeons was that he would not need any surgical intervention and continue with local wound care. I had personally communicated with Dr. Tama High regarding this. the patient is still on Xarelto. During the admission he was also seen by Dr. Ola Spurr for infectious disease consult and he recommended continue vancomycin for MRSA and change meropenem to ceftriaxone for this Proteus. He recommended a four-week course of antibiotic therapy and follow-up back with Dr. Ola Spurr in 4 weeks. before his discharge since there was clinical improvement Dr. Ola Spurr converted the treatment to oral clindamycin and oral Keflex for a four-week course of antibiotic therapy. He was to be monitored for C. difficile. 06/07/[name] -- he missed his appointment with Dr. Rosana Hoes at the surgical office. He continues to be on oral antibiotics as per Dr. Ola Spurr. 11/16/16 patient's wound on the right knee continues to trouble him. He tells me that the nursing facility has not changed this dressing in almost a week  and his wound is doing somewhat worse today in regard to undermining compared to last week's evaluation. He continues to be frustrated with this. Unfortunately he has tried to switch facilities but was not able to get into any other so far. 11/30/16 Patient's wound appears to be doing well on evaluation today. Fortunately he is tolerating the Iodoform packing  which it does sound the facility is performing appropriately. Nonetheless he still continues to have some discomfort there is no evidence of infection today. 12/07/16 on evaluation today patient appears to be doing fairly well in regard to his wound externally although it is somewhat deeper than on previous evaluation. Obviously this seems to be a result of over packing based on what we are seeing here in the office today. I'm happy they are packing the wound but at the same time we meet need to be sure not to overpack which can worsen the depth which appears to be [name], [name]. ([id number]) what happened. Fortunately there's no evidence of infection. 12/31/16 on evaluation today patient's knee wound appears to be doing slightly better. He shows no evidence of infection at this point. He still does have undermining towards the 6 o'clock location. Otherwise things seem to be doing well. No fevers, chills, nausea, or vomiting noted at this time. 01/07/17 on evaluation today patient's right knee wound appears to be doing fairly well there's no evidence of infection. Specifically No fevers, chills, nausea, or vomiting noted at this time. He is not having any discomfort at this point although he does have some dry skin of the right lower extremity he tells me he has been scratching this he appears to have some petechiae likely due to excoriation. Electronic Signature(s) Signed: 8/24/[name] 11:54:07 AM By: Christin Fudge MD, FACS Entered By: Christin Fudge on 08/24/[name] 10:50:31 [name], [name] ([id number]) -------------------------------------------------------------------------------- Physical Exam Details Patient Name: [name]. Date of Service: 8/24/[name] 10:15 AM Medical Record Number: [id number] Patient Account Number: [id number] Date of Birth/Sex: May 19, 1946 (71 y.o. Male) Treating RN: Montey Hora Primary Care Provider: [name] Dupre Other Clinician: Referring Provider:  [name] Dupre Treating Provider/Extender: Frann Rider in Treatment: 22 Constitutional . Pulse regular. Respirations normal and unlabored. Afebrile. . Eyes Nonicteric. Reactive to light. Ears, Nose, Mouth, and Throat Lips, teeth, and gums WNL.[name] Moist mucosa without lesions. Neck supple and nontender. No palpable supraclavicular or cervical adenopathy. Normal sized without goiter. Respiratory WNL. No retractions.. Cardiovascular Pedal Pulses WNL. No clubbing, cyanosis or edema. Lymphatic No adneopathy. No adenopathy. No adenopathy. Musculoskeletal Adexa without tenderness or enlargement.. Digits and nails w/o clubbing, cyanosis, infection, petechiae, ischemia, or inflammatory conditions.. Integumentary (Hair, Skin) No suspicious lesions. No crepitus or fluctuance. No peri-wound warmth or erythema. No masses.[name] Psychiatric Judgement and insight Intact.. No evidence of depression, anxiety, or agitation.. Notes the right knee has a minimal open area on the lateral part of his scar tissue and no packing is possible at this time. Electronic Signature(s) Signed: 8/24/[name] 11:54:07 AM By: Christin Fudge MD, FACS Entered By: Christin Fudge on 08/24/[name] 10:50:58 [name] ([id number]) -------------------------------------------------------------------------------- Physician Orders Details Patient Name: [name]. Date of Service: 8/24/[name] 10:15 AM Medical Record Number: [id number] Patient Account Number: [id number] Date of Birth/Sex: Sep 28, 1945 (71 y.o. Male) Treating RN: Montey Hora Primary Care Provider: [name] Dupre Other Clinician: Referring Provider: [name] Dupre Treating Provider/Extender: Frann Rider in Treatment: 79 Verbal / Phone Orders: No Diagnosis Coding Wound Cleansing Wound #1 Right,Anterior Knee o Clean wound with Normal Saline. o Cleanse wound  with mild soap and water Anesthetic Wound #1 Right,Anterior Knee o  Topical Lidocaine 4% cream applied to wound bed prior to debridement - Only in the Wound Clinic Skin Barriers/Peri-Wound Care Wound #1 Right,Anterior Knee o Moisturizing lotion Primary Wound Dressing Wound #1 Right,Anterior Knee o Dry Gauze - bolster with dry gauze Secondary Dressing Wound #1 Right,Anterior Knee o Boardered Foam Dressing Dressing Change Frequency Wound #1 Right,Anterior Knee o Change dressing every other day. o Other: - as needed Follow-up Appointments Wound #1 Right,Anterior Knee o Return Appointment in 1 week. Edema Control Wound #1 Right,Anterior Knee o Elevate legs to the level of the heart and pump ankles as often as possible Yoho, Jaquaveon [name]. ([id number]) Additional Orders / Instructions Wound #1 Right,Anterior Knee o Increase protein intake. o Activity as tolerated Medications-please add to medication list. Wound #1 Right,Anterior Knee o Other: - Vitamin C, Zinc, MVI Electronic Signature(s) Signed: 8/24/[name] 11:54:07 AM By: Christin Fudge MD, FACS Signed: 8/24/[name] 5:26:25 PM By: Montey Hora Entered By: Montey Hora on 08/24/[name] 10:44:48 [name], [name] ([id number]) -------------------------------------------------------------------------------- Problem List Details Patient Name: Holtmeyer, Ananias [name]. Date of Service: 8/24/[name] 10:15 AM Medical Record Number: [id number] Patient Account Number: [id number] Date of Birth/Sex: April 28, 1946 (71 y.o. 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, right knee 3/23/[name] Yes L97.812 Non-pressure chronic ulcer of other part of right lower leg 3/23/[name] Yes with fat  layer exposed L89.892 Pressure ulcer of other site, stage 2 3/23/[name] Yes M70.861 Other soft tissue disorders related to use, overuse and 3/23/[name] Yes pressure, right lower leg M71.061 Abscess of bursa, right knee 4/30/[name] Yes Inactive Problems Resolved Problems Electronic Signature(s) Signed: 8/24/[name] 11:54:07 AM By: Christin Fudge MD, FACS Entered By: Christin Fudge on 08/24/[name] 10:50:10 [name], [name] ([id number]) -------------------------------------------------------------------------------- Progress Note Details Patient Name: [name]. Date of Service: 8/24/[name] 10:15 AM Medical Record Number: [id number] Patient Account Number: [id number] Date of Birth/Sex: 03-13-1946 (71 y.o. Male) Treating RN: Montey Hora Primary Care Provider: [name] Dupre Other Clinician: Referring Provider: [name] Dupre Treating Provider/Extender: Frann Rider in Treatment: 22 Subjective Chief Complaint Information obtained from Patient Patient is at the clinic for treatment of an open pressure ulcer to the area of the right knee History of Present Illness (HPI) The following HPI elements were documented for the patient's wound: Location: right knee swelling with skin ulceration Quality: Patient reports experiencing a dull pain to affected area(s). Severity: Patient states wound are getting worse. Duration: Patient has had the wound for Timing: Pain in wound is Intermittent (comes and goes Context: The wound occurred when the patient had a cast placed over his right lower extremity and with the cast was removed he had a swelling of the knee with problems with his skin Modifying Factors: Other treatment(s) tried include:orthopedics Associated Signs and Symptoms: swelling of both lower extremities with large amount of swelling of the knee 71 year old gentleman who has been living in an independent living facility for long-term care has had recurrent falls and recently had  a large hematoma on the right knee. Known to be on aspirin and Plavix for history of CABG and was seen in the ER at the end of February after a fall. past medical history of arthritis, congestive heart failure, diabetes mellitus, hypertension, status post CABG o2, status  post cholecystectomy, hernia repair and kidney surgery. recent workup for the fall showed negative clinical findings in his extremities and it was thought that he was falling due to deconditioning. a x-ray showed a right fibular neck fracture and was placed in a splint. he was asked to be seen by orthopedic surgeon. the patient cannot tell me which orthopedic doctor had seen him and I'm not able to get any notes from [place], regarding his orthopedic care. The patient says twice during this last week he has had bloody fluid shooting out of his knee and has drained all over his boot and the flow. 03/30/[name] -- he was seen by the nurse practitioner at the facility and and distend she aspirated about 60 mL of hemorrhagic fluid from his right knee. 04/09/[name] -- since I saw the patient last he was admitted to the hospital at [facility] between 04/01/[name] 2 04/05/[name] and was treated with a pulmonary embolism. he had EKOS complete, was started on Xarelto, and also treated for chronic diastolic congestive heart failure. he was asked to follow-up with the wound center and continue management locally. 04/16/[name] -- he has an appointment to see his orthopedic surgeon later this week. 04/23/[name] -- he did see the orthopedic service and they have asked him to continue with physical therapy. They discontinued the boot and the x-ray done showed a healed fracture. They said it was okay to use a [name], [name]. ([id number]) wound VAC if needed. Addendum -- we got the note from the Orthopedic office where he was seen by [facility] orthopedics on [facility] in [place] and was seen by the PA Carlynn Spry. -- The x-ray which was done  was reviewed and there is good healing of the fracture and from the orthopedic point of view he has healed well and they have asked him to continue with good control of his diabetes and management by the wound center including using a wound VAC if required. 04/30/[name] -- the patient's wound VAC had been applied 2 days ago, but the black sponge had not been placed laterally under the skin flap and into the area of the right of the knee, where there was a large cavity. On examination, after removal of his wound VAC, there is a large collection of seropurulent fluid and hematoma in the area starting at the lateral knee and going down to his right lateral compartment of the leg. Clinically, this is a huge infected abscess cavity 05/10/[name] -- patient was admitted between 04/30/[name] and 05/02/[name] with cellulitis of the right lower extremity with a large infected hematoma which was drained at the bedside by surgical services. he was treated with Levaquin and continues to be on Xarelto. the patient is back on a wound VAC which has not been functioning due to the large depth of the hematoma and organized clots. He is still on Keflex orally. Addendum : I spoke to his surgeon Dr. Tama High and we discussed options for further draining this large hematoma which I suspect will get infected sooner than later. I expressed the fact that the wound VAC does not seem to be functioning well and competent enough to drain this large cavity. He will call for him in the office. KVQ25 [name] -- the patient has hypotension in the sitting position and is a bit lethargic as compared to his normal self. I spoke personally to his nurse at the nursing homes, Ms Judson Roch and discussed his management including the fact that he had to have a  surgical consultation for the right lower leg, with Dr. Tama High. Prior to the patient's discharge from the wound center he continues to have hypotension and hence I'm going to talk to the  ER physician and have him evaluated in the ER. 05/24/[name] -- the patient was admitted to the hospital between 05/17/[name] and 05/21/[name]. He was treated for hypokalemia, hyponatremia, renal insufficiency, sepsis and possible abscess of the right lower leg. he was seen by surgical services and the fluid collection was drained with a Yankauer suction from the knee wound and the opinion of the surgeons was that he would not need any surgical intervention and continue with local wound care. I had personally communicated with Dr. Tama High regarding this. the patient is still on Xarelto. During the admission he was also seen by Dr. Ola Spurr for infectious disease consult and he recommended continue vancomycin for MRSA and change meropenem to ceftriaxone for this Proteus. He recommended a four-week course of antibiotic therapy and follow-up back with Dr. Ola Spurr in 4 weeks. before his discharge since there was clinical improvement Dr. Ola Spurr converted the treatment to oral clindamycin and oral Keflex for a four-week course of antibiotic therapy. He was to be monitored for C. difficile. 06/07/[name] -- he missed his appointment with Dr. Rosana Hoes at the surgical office. He continues to be on oral antibiotics as per Dr. Ola Spurr. 11/16/16 patient's wound on the right knee continues to trouble him. He tells me that the nursing facility has not changed this dressing in almost a week and his wound is doing somewhat worse today in regard to undermining compared to last week's evaluation. He continues to be frustrated with this. Unfortunately he has tried to switch facilities but was not able to get into any other so far. 11/30/16 Patient's wound appears to be doing well on evaluation today. Fortunately he is tolerating the [name], [name]. ([id number]) Iodoform packing which it does sound the facility is performing appropriately. Nonetheless he still continues to have some discomfort there is no  evidence of infection today. 12/07/16 on evaluation today patient appears to be doing fairly well in regard to his wound externally although it is somewhat deeper than on previous evaluation. Obviously this seems to be a result of over packing based on what we are seeing here in the office today. I'm happy they are packing the wound but at the same time we meet need to be sure not to overpack which can worsen the depth which appears to be what happened. Fortunately there's no evidence of infection. 12/31/16 on evaluation today patient's knee wound appears to be doing slightly better. He shows no evidence of infection at this point. He still does have undermining towards the 6 o'clock location. Otherwise things seem to be doing well. No fevers, chills, nausea, or vomiting noted at this time. 01/07/17 on evaluation today patient's right knee wound appears to be doing fairly well there's no evidence of infection. Specifically No fevers, chills, nausea, or vomiting noted at this time. He is not having any discomfort at this point although he does have some dry skin of the right lower extremity he tells me he has been scratching this he appears to have some petechiae likely due to excoriation. Objective Constitutional Pulse regular. Respirations normal and unlabored. Afebrile. Vitals Time Taken: 10:30 AM, Height: 67 in, Weight: 357 lbs, BMI: 55.9, Temperature: 98.2 F, Pulse: 89 bpm, Respiratory Rate: 16 breaths/min, Blood Pressure: 96/57 mmHg. Eyes Nonicteric. Reactive to light. Ears, Nose, Mouth, and Throat Lips,  teeth, and gums WNL.[name] Moist mucosa without lesions. Neck supple and nontender. No palpable supraclavicular or cervical adenopathy. Normal sized without goiter. Respiratory WNL. No retractions.. Cardiovascular Pedal Pulses WNL. No clubbing, cyanosis or edema. TORIAN, QUINTERO ([id number]) Lymphatic No adneopathy. No adenopathy. No adenopathy. Musculoskeletal Adexa without tenderness  or enlargement.. Digits and nails w/o clubbing, cyanosis, infection, petechiae, ischemia, or inflammatory conditions.[name] Psychiatric Judgement and insight Intact.. No evidence of depression, anxiety, or agitation.. General Notes: the right knee has a minimal open area on the lateral part of his scar tissue and no packing is possible at this time. Integumentary (Hair, Skin) No suspicious lesions. No crepitus or fluctuance. No peri-wound warmth or erythema. No masses.. Wound #1 status is Open. Original cause of wound was Pressure Injury. The wound is located on the Right,Anterior Knee. The wound measures 0.2cm length x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

## 2017-01-22 ENCOUNTER — Encounter: Payer: Self-pay | Admitting: Emergency Medicine

## 2017-01-22 ENCOUNTER — Encounter: Payer: Medicare Other | Admitting: Surgery

## 2017-01-22 ENCOUNTER — Emergency Department
Admission: EM | Admit: 2017-01-22 | Discharge: 2017-01-22 | Disposition: A | Discharge status: Skilled Nursing Facility | Payer: Medicare Other | Attending: Emergency Medicine | Admitting: Emergency Medicine

## 2017-01-22 ENCOUNTER — Emergency Department: Payer: Medicare Other

## 2017-01-22 DIAGNOSIS — E11622 Type 2 diabetes mellitus with other skin ulcer: Secondary | ICD-10-CM | POA: Diagnosis not present

## 2017-01-22 DIAGNOSIS — R110 Nausea: Secondary | ICD-10-CM | POA: Diagnosis not present

## 2017-01-22 DIAGNOSIS — N183 Chronic kidney disease, stage 3 (moderate): Secondary | ICD-10-CM | POA: Insufficient documentation

## 2017-01-22 DIAGNOSIS — R0602 Shortness of breath: Secondary | ICD-10-CM | POA: Diagnosis not present

## 2017-01-22 DIAGNOSIS — E1122 Type 2 diabetes mellitus with diabetic chronic kidney disease: Secondary | ICD-10-CM | POA: Insufficient documentation

## 2017-01-22 DIAGNOSIS — I2510 Atherosclerotic heart disease of native coronary artery without angina pectoris: Secondary | ICD-10-CM | POA: Insufficient documentation

## 2017-01-22 DIAGNOSIS — R079 Chest pain, unspecified: Secondary | ICD-10-CM

## 2017-01-22 DIAGNOSIS — F17210 Nicotine dependence, cigarettes, uncomplicated: Secondary | ICD-10-CM | POA: Diagnosis not present

## 2017-01-22 DIAGNOSIS — R0789 Other chest pain: Secondary | ICD-10-CM | POA: Diagnosis not present

## 2017-01-22 DIAGNOSIS — I509 Heart failure, unspecified: Secondary | ICD-10-CM | POA: Insufficient documentation

## 2017-01-22 DIAGNOSIS — J45909 Unspecified asthma, uncomplicated: Secondary | ICD-10-CM | POA: Insufficient documentation

## 2017-01-22 DIAGNOSIS — Z86711 Personal history of pulmonary embolism: Secondary | ICD-10-CM | POA: Diagnosis not present

## 2017-01-22 DIAGNOSIS — I130 Hypertensive heart and chronic kidney disease with heart failure and stage 1 through stage 4 chronic kidney disease, or unspecified chronic kidney disease: Secondary | ICD-10-CM | POA: Insufficient documentation

## 2017-01-22 LAB — BASIC METABOLIC PANEL
Anion gap: 15 (ref 5–15)
BUN: 49 mg/dL — AB (ref 6–20)
CO2: 29 mmol/L (ref 22–32)
CREATININE: 1.89 mg/dL — AB (ref 0.61–1.24)
Calcium: 9.7 mg/dL (ref 8.9–10.3)
Chloride: 84 mmol/L — ABNORMAL LOW (ref 101–111)
GFR calc Af Amer: 39 mL/min — ABNORMAL LOW (ref >60)
GFR calc non Af Amer: 34 mL/min — ABNORMAL LOW (ref >60)
GLUCOSE: 197 mg/dL — AB (ref 65–99)
Potassium: 2.9 mmol/L — ABNORMAL LOW (ref 3.5–5.1)
SODIUM: 128 mmol/L — AB (ref 135–145)

## 2017-01-22 LAB — CBC
HCT: 41.2 % (ref 40.0–52.0)
Hemoglobin: 13.6 g/dL (ref 13.0–18.0)
MCH: 25.6 pg — AB (ref 26.0–34.0)
MCHC: 33.1 g/dL (ref 32.0–36.0)
MCV: 77.2 fL — AB (ref 80.0–100.0)
PLATELETS: 391 10*3/uL (ref 150–440)
RBC: 5.34 MIL/uL (ref 4.40–5.90)
RDW: 17.4 % — AB (ref 11.5–14.5)
WBC: 7.8 10*3/uL (ref 3.8–10.6)

## 2017-01-22 LAB — TROPONIN I: Troponin I: 0.04 ng/mL (ref <0.03)
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
## 2017-01-22 NOTE — ED Notes (Signed)
Date and time results received: 01/22/17 1041 (use smartphrase ".now" to insert current time)  Test: troponin Critical Value: 0.04  Name of Provider Notified: williams

## 2017-01-22 NOTE — ED Notes (Signed)
Patient given [place] sandwich tray and drink. Repositioned in bed. No further needs expressed at this time.
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
## 2017-01-22 NOTE — ED Notes (Signed)
Patient transported to X-ray 

## 2017-01-22 NOTE — ED Notes (Signed)
Pt is in room singing

## 2017-01-22 NOTE — ED Notes (Signed)
Patient transported back to [facility] via [facility]. Consent for discharge give by patient and patient's POA, Carmen.

## 2017-01-22 NOTE — ED Provider Notes (Signed)
[facility] Emergency Department Provider Note       Time seen: ----------------------------------------- 9:49 AM on 01/22/2017 -----------------------------------------     I have reviewed the triage vital signs and the nursing notes.   HISTORY   Chief Complaint No chief complaint on file.    HPI [name] is a 71 y.o. male who presents to the ED for chest pressure that started yesterday and persisted into today. Patient states he had chest pain that continued today. Yesterday [name] would not take him to the emergency department. Patient reports she has chest pain every day. He has had associated shortness of breath and nausea but no diaphoresis.   Past Medical History:  Diagnosis Date  . Abscess 10/08/2016  . Abscess of right leg excluding foot   . Acute on chronic renal failure ([place]) 08/23/2016  . Acute on chronic respiratory failure ([place]) 08/23/2016  . AKI (acute kidney injury) ([place]) 08/27/2016  . Arthritis   . Asthma   . Bipolar 1 disorder ([place]) 07/23/2016   Last Assessment & Plan:  On multiple meds including geodon and seems to be stable.    [name] CAD (coronary artery disease) 04/27/2016  . Cellulitis 09/21/2016  . Chest pain, rule out acute myocardial infarction 02/16/2016  . CHF (congestive heart failure) ([place])   . Chronic bronchitis ([place]) 07/23/2016   Last Assessment & Plan:  Breathing at baseline now with some chronic sob.   . Current use of proton pump inhibitor 08/23/2016  . Demand ischemia ([place]) 08/27/2016  . Diabetes ([place])   . DVT (deep venous thrombosis) ([place]) 08/27/2016  . Electrolyte imbalance 08/23/2016  . Elevated troponin 08/27/2016  . Heart trouble   . History of asthma 08/23/2016  . History of bipolar disorder 08/23/2016  . History of chronic pain 08/23/2016  . History of gastric ulcer 08/23/2016  . History of stomach ulcers   . History of stroke 07/23/2016   Last Assessment & Plan:  On asa/plavix so won't increase asa for now re dvt  prevention  . HTN (hypertension) 04/27/2016  . Hypertension   . Lactic acidosis 08/23/2016  . 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 Obesity, unspecified 11/22/2013  . OSA (obstructive sleep apnea) 08/23/2016  . Pulmonary embolism ([place]) 08/23/2016  . Reactive airway disease 11/22/2013  . S/P CABG x 2   . SIRS (systemic inflammatory response syndrome) ([place]) 08/23/2016  . Skin cancer   . Stroke [facility])     Patient Active Problem List   Diagnosis Date Noted  . Abscess 10/08/2016  . Abscess of right leg excluding foot   . Cellulitis 09/21/2016  . AKI (acute kidney injury) ([place]) 08/27/2016  . Demand ischemia ([place]) 08/27/2016  . Elevated troponin 08/27/2016  . 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 DVT (deep venous thrombosis) ([place]) 08/27/2016  . Pressure injury of skin 08/24/2016  . Pulmonary embolism ([place]) 08/23/2016  . SIRS (systemic inflammatory response syndrome) ([place]) 08/23/2016  . Acute on chronic renal failure ([place]) 08/23/2016  . Acute on chronic respiratory failure ([place]) 08/23/2016  . Lactic acidosis 08/23/2016  . Electrolyte imbalance 08/23/2016  . Current use of proton pump inhibitor 08/23/2016  . OSA (obstructive sleep apnea) 08/23/2016  . History of bipolar disorder 08/23/2016  . History of gastric ulcer 08/23/2016  . History of asthma 08/23/2016  . History of chronic pain 08/23/2016  . Acute pulmonary embolism ([place]) 08/23/2016  . Age-related osteoporosis with current pathological fracture with routine healing 07/23/2016  . Bipolar 1 disorder ([place]) 07/23/2016  .  Chronic bronchitis ([place]) 07/23/2016  . History of stroke 07/23/2016  . Chest pain 07/14/2016  . UTI (urinary tract infection) 04/27/2016  . CAD (coronary artery disease) 04/27/2016  . Diabetes ([place]) 04/27/2016  . HTN (hypertension) 04/27/2016  . Chronic diastolic CHF (congestive heart failure) ([place]) 04/27/2016  . CKD (chronic kidney disease), stage III 04/27/2016  . Chest pain, rule out acute myocardial infarction 02/16/2016  . Obesity,  unspecified 11/22/2013  . Reactive airway disease 11/22/2013    Past Surgical History:  Procedure Laterality Date  . CARDIAC SURGERY    . CHOLECYSTECTOMY    . GALLBLADDER SURGERY    . HEART BYPASS    . HERNIA REPAIR    . IR ANGIOGRAM PULMONARY BILATERAL SELECTIVE  08/23/2016  . IR ANGIOGRAM SELECTIVE EACH ADDITIONAL VESSEL  08/23/2016  . IR ANGIOGRAM SELECTIVE EACH ADDITIONAL VESSEL  08/23/2016  . IR INFUSION THROMBOL ARTERIAL INITIAL (MS)  08/23/2016  . IR INFUSION THROMBOL ARTERIAL INITIAL (MS)  08/23/2016  . IR THROMB F/U EVAL ART/VEN FINAL DAY (MS)  08/24/2016  . IR US GUIDE VASC ACCESS RIGHT  08/23/2016  . KIDNEY SURGERY      Allergies Dilaudid [hydromorphone hcl]; Hydrocodone-acetaminophen; Hydromorphone; Morphine and related; Tetracycline; Tetracyclines & related; Penicillins; and Sulfa antibiotics  Social History Social History  Substance Use Topics  . Smoking status: Current Some Day Smoker    Packs/day: 0.25    Types: Cigarettes    Last attempt to quit: 03/15/1963  . Smokeless tobacco: Never Used  . Alcohol use No    Review of Systems Constitutional: Negative for fever. Eyes: Negative for vision changes ENT:  Negative for congestion, sore throat Cardiovascular: Positive for chest pain Respiratory: Positive for shortness of breath Gastrointestinal: Negative for abdominal pain, positive for nausea Genitourinary: Negative for dysuria. Musculoskeletal: Negative for back pain. Skin: Negative for rash. Neurological: Negative for headaches, focal weakness or numbness.  All systems negative/normal/unremarkable except as stated in the HPI  ____________________________________________   PHYSICAL EXAM:  VITAL SIGNS: ED Triage Vitals  Enc Vitals Group     BP      Pulse      Resp      Temp      Temp src      SpO2      Weight      Height      Head Circumference      Peak Flow      Pain Score      Pain Loc      Pain Edu?      Excl. in [place]?    Constitutional: Alert  and oriented. Well appearing and in no distress. Eyes: Conjunctivae are normal. Normal extraocular movements. ENT   Head: Normocephalic and atraumatic.   Nose: No congestion/rhinnorhea.   Mouth/Throat: Mucous membranes are moist.   Neck: No stridor. Cardiovascular: Normal rate, regular rhythm. No murmurs, rubs, or gallops. Respiratory: Normal respiratory effort without tachypnea nor retractions. Breath sounds are clear and equal bilaterally. No wheezes/rales/rhonchi. Gastrointestinal: Soft and nontender. Normal bowel sounds Musculoskeletal: Nontender with normal range of motion in extremities. No lower extremity tenderness nor edema. Neurologic:  Normal speech and language. No gross focal neurologic deficits are appreciated.  Skin:  Skin is warm, dry and intact. No rash noted. Psychiatric: Flat affect ____________________________________________  EKG: Interpreted by me. Sinus rhythm rate 81 bpm, normal PR interval, wide QRS, long QT, artifact  ____________________________________________  ED COURSE:  Pertinent labs & imaging results that were available during my care of  the patient were reviewed by me and considered in my medical decision making (see chart for details). Patient presents for chest pain, we will assess with labs and imaging as indicated.   Procedures ____________________________________________   LABS (pertinent positives/negatives)  Labs Reviewed  BASIC METABOLIC PANEL - Abnormal; Notable for the following:       Result Value   Sodium 128 (*)    Potassium 2.9 (*)    Chloride 84 (*)    Glucose, Bld 197 (*)    BUN 49 (*)    Creatinine, Ser 1.89 (*)    GFR calc non Af Amer 34 (*)    GFR calc Af Amer 39 (*)    All other components within normal limits  CBC - Abnormal; Notable for the following:    MCV 77.2 (*)    MCH 25.6 (*)    RDW 17.4 (*)    All other components within normal limits  TROPONIN I - Abnormal; Notable for the following:     Troponin I 0.04 (*)    All other components within normal limits    RADIOLOGY Images were viewed by me  Chest x-ray IMPRESSION: Prior CABG.  Chronic elevation of the right hemidiaphragm with right base atelectasis or scarring.  No active disease. ____________________________________________  FINAL ASSESSMENT AND PLAN  Chest pain  Plan: Patient's labs and imaging were dictated above. Patient had presented for Chest pain of uncertain etiology. Chest pain is likely musculoskeletal in origin and troponin is chronically albeit mildly elevated. He is stable for outpatient follow-up. Patient states he has chest pain every day.   Earleen Newport, MD   Note: This note was generated in part or whole with voice recognition software. Voice recognition is usually quite accurate but there are transcription errors that can and very often do occur. I apologize for any typographical errors that were not detected and corrected.     Earleen Newport, MD 01/22/17 [phone]

## 2017-01-22 NOTE — ED Notes (Signed)
BPD at bedside 
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
## 2017-01-22 NOTE — ED Triage Notes (Signed)
Pt presents for right sided chest pain that radiates to left. Has chest pain daily per report. From [facility] but sent from [facility] d clinic today

## 2017-01-24 NOTE — Progress Notes (Signed)
ASAAD, GULLEY ([id number]) Visit Report for 01/22/2017 Chief Complaint Document Details Patient Name: [name]. Date of Service: 01/22/2017 8:45 AM Medical Record Number: [id number] Patient Account Number: [id number] Date of Birth/Sex: 1945/07/31 (71 y.o. Male) Treating RN: Ahmed Prima Primary Care Provider: [name] Dupre Other Clinician: Referring Provider: [name] Dupre Treating Provider/Extender: Frann Rider in Treatment: 34 Information Obtained from: Patient Chief Complaint Patient is at the clinic for treatment of an open pressure ulcer to the area of the right knee Electronic Signature(s) Signed: 01/22/2017 3:31:36 PM By: Christin Fudge MD, FACS Entered By: Christin Fudge on 01/22/2017 09:10:32 Ducre, [name] ([id number]) -------------------------------------------------------------------------------- HPI Details Patient Name: [name] T. Date of Service: 01/22/2017 8:45 AM Medical Record Number: [id number] Patient Account Number: [id number] Date of Birth/Sex: March 07, 1946 (71 y.o. Male) Treating RN: Ahmed Prima Primary Care Provider: [name] Dupre Other Clinician: Referring Provider: [name] Dupre Treating Provider/Extender: Frann Rider in Treatment: 23 History of Present Illness Location: right knee swelling with skin ulceration Quality: Patient reports experiencing a dull pain to affected area(s). Severity: Patient states wound are getting worse. Duration: Patient has had the wound for <4 weeks prior to presenting for treatment Timing: Pain in wound is Intermittent (comes and goes Context: The wound occurred when the patient had a cast placed over his right lower extremity and with the cast was removed he had a swelling of the knee with problems with his skin Modifying Factors: Other treatment(s) tried include:orthopedics Associated Signs and Symptoms: swelling of both lower extremities with large amount of swelling of  the knee HPI Description: 71 year old gentleman who has been living in an independent living facility for long-term care has had recurrent falls and recently had a large hematoma on the right knee. Known to be on aspirin and Plavix for history of CABG and was seen in the ER at the end of February after a fall. past medical history of arthritis, congestive heart failure, diabetes mellitus, hypertension, status post CABG o2, status post cholecystectomy, hernia repair and kidney surgery. recent workup for the fall showed negative clinical findings in his extremities and it was thought that he was falling due to deconditioning. a x-ray showed a right fibular neck fracture and was placed in a splint. he was asked to be seen by orthopedic surgeon. the patient cannot tell me which orthopedic doctor had seen him and I'm not able to get any notes from [place], regarding his orthopedic care. The patient says twice during this last week he has had bloody fluid shooting out of his knee and has drained all over his boot and the flow. 08/21/2016 -- he was seen by the nurse practitioner at the facility and and distend she aspirated about 60 mL of hemorrhagic fluid from his right knee. 08/31/2016 -- since I saw the patient last he was admitted to the hospital at [facility] between 08/23/2016 2 08/27/2016 and was treated with a pulmonary embolism. he had EKOS complete, was started on Xarelto, and also treated for chronic diastolic congestive heart failure. he was asked to follow-up with the wound center and continue management locally. 09/07/2016 -- he has an appointment to see his orthopedic surgeon later this week. 09/14/2016 -- he did see the orthopedic service and they have asked him to continue with physical therapy. They discontinued the boot and the x-ray done showed a healed fracture. They said it was okay to use a wound VAC if needed. Addendum -- we got the note from the Orthopedic office where he was  seen by [facility] orthopedics on [facility] in [place] and was seen by the PA Carlynn Spry. -- The x-ray which was done was reviewed and there is good healing of the fracture and from the orthopedic point of view he has healed well and they have asked him to continue with good control of his diabetes and management by the wound center including using a wound VAC if required. WINDSOR, [name] ([id number]) 09/21/2016 -- the patient's wound VAC had been applied 2 days ago, but the black sponge had not been placed laterally under the skin flap and into the area of the right of the knee, where there was a large cavity. On examination, after removal of his wound VAC, there is a large collection of seropurulent fluid and hematoma in the area starting at the lateral knee and going down to his right lateral compartment of the leg. Clinically, this is a huge infected abscess cavity 10/01/2016 -- patient was admitted between 09/21/2016 and 09/23/2016 with cellulitis of the right lower extremity with a large infected hematoma which was drained at the bedside by surgical services. he was treated with Levaquin and continues to be on Xarelto. the patient is back on a wound VAC which has not been functioning due to the large depth of the hematoma and organized clots. He is still on Keflex orally. Addendum : I spoke to his surgeon Dr. Tama High and we discussed options for further draining this large hematoma which I suspect will get infected sooner than later. I expressed the fact that the wound VAC does not seem to be functioning well and competent enough to drain this large cavity. He will call for him in the office. [id number] 2018 -- the patient has hypotension in the sitting position and is a bit lethargic as compared to his normal self. I spoke personally to his nurse at the nursing homes, Ms Judson Roch and discussed his management including the fact that he had to have a surgical consultation for the  right lower leg, with Dr. Tama High. Prior to the patient's discharge from the wound center he continues to have hypotension and hence I'm going to talk to the ER physician and have him evaluated in the ER. 10/15/2016 -- the patient was admitted to the hospital between 10/08/2016 and 10/12/2016. He was treated for hypokalemia, hyponatremia, renal insufficiency, sepsis and possible abscess of the right lower leg. he was seen by surgical services and the fluid collection was drained with a Yankauer suction from the knee wound and the opinion of the surgeons was that he would not need any surgical intervention and continue with local wound care. I had personally communicated with Dr. Tama High regarding this. the patient is still on Xarelto. During the admission he was also seen by Dr. Ola Spurr for infectious disease consult and he recommended continue vancomycin for MRSA and change meropenem to ceftriaxone for this Proteus. He recommended a four-week course of antibiotic therapy and follow-up back with Dr. Ola Spurr in 4 weeks. before his discharge since there was clinical improvement Dr. Ola Spurr converted the treatment to oral clindamycin and oral Keflex for a four-week course of antibiotic therapy. He was to be monitored for C. difficile. 10/29/2016 -- he missed his appointment with Dr. Rosana Hoes at the surgical office. He continues to be on oral antibiotics as per Dr. Ola Spurr. 11/16/16 patient's wound on the right knee continues to trouble him. He tells me that the nursing facility has not changed this dressing in almost a week  and his wound is doing somewhat worse today in regard to undermining compared to last week's evaluation. He continues to be frustrated with this. Unfortunately he has tried to switch facilities but was not able to get into any other so far. 11/30/16 Patient's wound appears to be doing well on evaluation today. Fortunately he is tolerating the Iodoform packing  which it does sound the facility is performing appropriately. Nonetheless he still continues to have some discomfort there is no evidence of infection today. 12/07/16 on evaluation today patient appears to be doing fairly well in regard to his wound externally although it is somewhat deeper than on previous evaluation. Obviously this seems to be a result of over packing based on what we are seeing here in the office today. I'm happy they are packing the wound but at the same time we meet need to be sure not to overpack which can worsen the depth which appears to be Kary, Larren T. ([id number]) what happened. Fortunately there's no evidence of infection. 12/31/16 on evaluation today patient's knee wound appears to be doing slightly better. He shows no evidence of infection at this point. He still does have undermining towards the 6 o'clock location. Otherwise things seem to be doing well. No fevers, chills, nausea, or vomiting noted at this time. 01/07/17 on evaluation today patient's right knee wound appears to be doing fairly well there's no evidence of infection. Specifically No fevers, chills, nausea, or vomiting noted at this time. He is not having any discomfort at this point although he does have some dry skin of the right lower extremity he tells me he has been scratching this he appears to have some petechiae likely due to excoriation. Electronic Signature(s) Signed: 01/22/2017 3:31:36 PM By: Christin Fudge MD, FACS Entered By: Christin Fudge on 01/22/2017 09:10:38 Kwong, [name] ([id number]) -------------------------------------------------------------------------------- Physical Exam Details Patient Name: [name] T. Date of Service: 01/22/2017 8:45 AM Medical Record Number: [id number] Patient Account Number: [id number] Date of Birth/Sex: 07/14/1945 (71 y.o. Male) Treating RN: Ahmed Prima Primary Care Provider: [name] Dupre Other Clinician: Referring Provider: [name] Dupre Treating Provider/Extender: Frann Rider in Treatment: 23 Constitutional . Pulse regular. Respirations normal and unlabored. Afebrile. . Eyes Nonicteric. Reactive to light. Ears, Nose, Mouth, and Throat Lips, teeth, and gums WNL.[name] Moist mucosa without lesions. Neck supple and nontender. No palpable supraclavicular or cervical adenopathy. Normal sized without goiter. Respiratory WNL. No retractions.. Cardiovascular Pedal Pulses WNL. No clubbing, cyanosis or edema. Lymphatic No adneopathy. No adenopathy. No adenopathy. Musculoskeletal Adexa without tenderness or enlargement.. Digits and nails w/o clubbing, cyanosis, infection, petechiae, ischemia, or inflammatory conditions.. Integumentary (Hair, Skin) No suspicious lesions. No crepitus or fluctuance. No peri-wound warmth or erythema. No masses.[name] Psychiatric Judgement and insight Intact.. No evidence of depression, anxiety, or agitation.. Notes the right knee wound is completely healed. Electronic Signature(s) Signed: 01/22/2017 3:31:36 PM By: Christin Fudge MD, FACS Entered By: Christin Fudge on 01/22/2017 09:10:58 Leggette, [name] ([id number]) -------------------------------------------------------------------------------- Physician Orders Details Patient Name: [name] T. Date of Service: 01/22/2017 8:45 AM Medical Record Number: [id number] Patient Account Number: [id number] Date of Birth/Sex: 08-21-1945 (71 y.o. Male) Treating RN: Ahmed Prima Primary Care Provider: [name] Dupre Other Clinician: Referring Provider: [name] Dupre Treating Provider/Extender: Frann Rider in Treatment: 80 Verbal / Phone Orders: No Diagnosis Coding Discharge From [facility] o Discharge from [place] - Please put a foam dressing on area and change every 3 days for one week. Please keep clean and dry for  one week. Call us if you have any questions or concerns. Electronic Signature(s) Signed:  01/22/2017 3:31:36 PM By: Christin Fudge MD, FACS Signed: 01/22/2017 4:51:13 PM By: Alric Quan Entered By: Alric Quan on 01/22/2017 09:01:24 Wormley, [name] ([id number]) -------------------------------------------------------------------------------- Problem List Details Patient Name: Juhnke, Alif T. Date of Service: 01/22/2017 8:45 AM Medical Record Number: [id number] Patient Account Number: [id number] Date of Birth/Sex: [birth year] (71 y.o. 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, right knee 08/14/2016 Yes L97.812 Non-pressure chronic ulcer of other part of right lower leg 08/14/2016 Yes with fat layer exposed L89.892 Pressure ulcer of other site, stage 2 08/14/2016 Yes M70.861 Other soft tissue disorders related to use, overuse and 08/14/2016 Yes pressure, right lower leg M71.061 Abscess of bursa, right knee 09/21/2016 Yes Inactive Problems Resolved Problems Electronic Signature(s) Signed: 01/22/2017 3:31:36 PM By: Christin Fudge MD, FACS Entered By: Christin Fudge on 01/22/2017 09:10:18 Salahuddin, [name] ([id number]) -------------------------------------------------------------------------------- Progress Note Details Patient Name: [name] T. Date of Service: 01/22/2017 8:45 AM Medical Record Number: [id number] Patient Account Number: [id number] Date of Birth/Sex: [birth year] (71 y.o. Male) Treating RN: Ahmed Prima Primary Care Provider: [name] Dupre Other Clinician: Referring Provider: [name] Dupre Treating Provider/Extender: Frann Rider in Treatment: 99 Subjective Chief Complaint Information obtained from  Patient Patient is at the clinic for treatment of an open pressure ulcer to the area of the right knee History of Present Illness (HPI) The following HPI elements were documented for the patient's wound: Location: right knee swelling with skin ulceration Quality: Patient reports experiencing a dull pain to affected area(s). Severity: Patient states wound are getting worse. Duration: Patient has had the wound for Timing: Pain in wound is Intermittent (comes and goes Context: The wound occurred when the patient had a cast placed over his right lower extremity and with the cast was removed he had a swelling of the knee with problems with his skin Modifying Factors: Other treatment(s) tried include:orthopedics Associated Signs and Symptoms: swelling of both lower extremities with large amount of swelling of the knee 71 year old gentleman who has been living in an independent living facility for long-term care has had recurrent falls and recently had a large hematoma on the right knee. Known to be on aspirin and Plavix for history of CABG and was seen in the ER at the end of February after a fall. past medical history of arthritis, congestive heart failure, diabetes mellitus, hypertension, status post CABG o2, status post cholecystectomy, hernia repair and kidney surgery. recent workup for the fall showed negative clinical findings in his extremities and it was thought that he was falling due to deconditioning. a x-ray showed a right fibular neck fracture and was placed in a splint. he was asked to be seen by orthopedic surgeon. the patient cannot tell me which orthopedic doctor had seen him and I'm not able to get any notes from [place], regarding his orthopedic care. The patient says twice during this last week he has had bloody fluid shooting out of his knee and has drained all over his boot and the flow. 08/21/2016 -- he was seen by the nurse practitioner at the facility and and distend she  aspirated about 60 mL of hemorrhagic fluid from his right knee. 08/31/2016 -- since I saw the patient  last he was admitted to the hospital at [facility] between 08/23/2016 2 08/27/2016 and was treated with a pulmonary embolism. he had EKOS complete, was started on Xarelto, and also treated for chronic diastolic congestive heart failure. he was asked to follow-up with the wound center and continue management locally. 09/07/2016 -- he has an appointment to see his orthopedic surgeon later this week. 09/14/2016 -- he did see the orthopedic service and they have asked him to continue with physical therapy. They discontinued the boot and the x-ray done showed a healed fracture. They said it was okay to use a Reichenbach, Vihaan T. ([id number]) wound VAC if needed. Addendum -- we got the note from the Orthopedic office where he was seen by [facility] orthopedics on [facility] in [place] and was seen by the PA Carlynn Spry. -- The x-ray which was done was reviewed and there is good healing of the fracture and from the orthopedic point of view he has healed well and they have asked him to continue with good control of his diabetes and management by the wound center including using a wound VAC if required. 09/21/2016 -- the patient's wound VAC had been applied 2 days ago, but the black sponge had not been placed laterally under the skin flap and into the area of the right of the knee, where there was a large cavity. On examination, after removal of his wound VAC, there is a large collection of seropurulent fluid and hematoma in the area starting at the lateral knee and going down to his right lateral compartment of the leg. Clinically, this is a huge infected abscess cavity 10/01/2016 -- patient was admitted between 09/21/2016 and 09/23/2016 with cellulitis of the right lower extremity with a large infected hematoma which was drained at the bedside by surgical services. he was treated with Levaquin  and continues to be on Xarelto. the patient is back on a wound VAC which has not been functioning due to the large depth of the hematoma and organized clots. He is still on Keflex orally. Addendum : I spoke to his surgeon Dr. Tama High and we discussed options for further draining this large hematoma which I suspect will get infected sooner than later. I expressed the fact that the wound VAC does not seem to be functioning well and competent enough to drain this large cavity. He will call for him in the office. PYK99 2018 -- the patient has hypotension in the sitting position and is a bit lethargic as compared to his normal self. I spoke personally to his nurse at the nursing homes, Ms Judson Roch and discussed his management including the fact that he had to have a surgical consultation for the right lower leg, with Dr. Tama High. Prior to the patient's discharge from the wound center he continues to have hypotension and hence I'm going to talk to the ER physician and have him evaluated in the ER. 10/15/2016 -- the patient was admitted to the hospital between 10/08/2016 and 10/12/2016. He was treated for hypokalemia, hyponatremia, renal insufficiency, sepsis and possible abscess of the right lower leg. he was seen by surgical services and the fluid collection was drained with a Yankauer suction from the knee wound and the opinion of the surgeons was that he would not need any surgical intervention and continue with local wound care. I had personally communicated with Dr. Tama High regarding this. the patient is still on Xarelto. During the admission he was also seen by Dr. Ola Spurr for  infectious disease consult and he recommended continue vancomycin for MRSA and change meropenem to ceftriaxone for this Proteus. He recommended a four-week course of antibiotic therapy and follow-up back with Dr. Ola Spurr in 4 weeks. before his discharge since there was clinical improvement Dr. Ola Spurr  converted the treatment to oral clindamycin and oral Keflex for a four-week course of antibiotic therapy. He was to be monitored for C. difficile. 10/29/2016 -- he missed his appointment with Dr. Rosana Hoes at the surgical office. He continues to be on oral antibiotics as per Dr. Ola Spurr. 11/16/16 patient's wound on the right knee continues to trouble him. He tells me that the nursing facility has not changed this dressing in almost a week and his wound is doing somewhat worse today in regard to undermining compared to last week's evaluation. He continues to be frustrated with this. Unfortunately he has tried to switch facilities but was not able to get into any other so far. 11/30/16 Patient's wound appears to be doing well on evaluation today. Fortunately he is tolerating the Lipinski, Caster T. ([id number]) Iodoform packing which it does sound the facility is performing appropriately. Nonetheless he still continues to have some discomfort there is no evidence of infection today. 12/07/16 on evaluation today patient appears to be doing fairly well in regard to his wound externally although it is somewhat deeper than on previous evaluation. Obviously this seems to be a result of over packing based on what we are seeing here in the office today. I'm happy they are packing the wound but at the same time we meet need to be sure not to overpack which can worsen the depth which appears to be what happened. Fortunately there's no evidence of infection. 12/31/16 on evaluation today patient's knee wound appears to be doing slightly better. He shows no evidence of infection at this point. He still does have undermining towards the 6 o'clock location. Otherwise things seem to be doing well. No fevers, chills, nausea, or vomiting noted at this time. 01/07/17 on evaluation today patient's right knee wound appears to be doing fairly well there's no evidence of infection. Specifically No fevers, chills, nausea, or  vomiting noted at this time. He is not having any discomfort at this point although he does have some dry skin of the right lower extremity he tells me he has been scratching this he appears to have some petechiae likely due to excoriation. Objective Constitutional Pulse regular. Respirations normal and unlabored. Afebrile. Vitals Time Taken: 8:48 AM, Height: 67 in, Weight: 357 lbs, BMI: 55.9, Temperature: 98.2 F, Pulse: 71 bpm, Respiratory Rate: 16 breaths/min, Blood Pressure: 109/51 mmHg. Eyes Nonicteric. Reactive to light. Ears, Nose, Mouth, and Throat Lips, teeth, and gums WNL.[name] Moist mucosa without lesions. Neck supple and nontender. No palpable supraclavicular or cervical adenopathy. Normal sized without goiter. Respiratory WNL. No retractions.. Cardiovascular Pedal Pulses WNL. No clubbing, cyanosis or edema. TULIO, FACUNDO ([id number]) Lymphatic No adneopathy. No adenopathy. No adenopathy. Musculoskeletal Adexa without tenderness or enlargement.. Digits and nails w/o clubbing, cyanosis, infection, petechiae, ischemia, or inflammatory conditions.[name] Psychiatric Judgement and insight Intact.. No evidence of depression, anxiety, or agitation.. General Notes: the right knee wound is completely healed. Integumentary (Hair, Skin) No suspicious lesions. No crepitus or fluctuance. No peri-wound warmth or erythema. No masses.. Wound #1 status is Open. Original cause of wound was Pressure Injury. The wound is located on the Right,Anterior Knee. The wound measures 0cm length x 0cm width x 0cm depth; 0cm^2 area and 0cm^3 volume. There is  Fat Layer (Subcutaneous Tissue) Exposed exposed. There is no tunneling or undermining noted. There is a none present amount of drainage noted. The wound margin is flat and intact. There is no granulation within the wound bed. There is no necrotic tissue within the wound bed. The periwound skin appearance exhibited: Erythema. The periwound skin  appearance did not exhibit: Callus, Crepitus, Excoriation, Induration, Rash, Scarring, Dry/Scaly, Maceration, Atrophie Blanche, Cyanosis, Ecchymosis, Hemosiderin Staining, Mottled, Pallor, Rubor. The surrounding wound skin color is noted with erythema which is circumferential. Periwound temperature was noted as No Abnormality. The periwound has tenderness on palpation. 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 Please keep clean and dry for one week. Call us if you have any questions or concerns. the right knee wound is healed and there has been a supple scar which will need a protective foam for the next couple of weeks. He is discharged from the wound care services and be seen back only if needed Electronic Signature(s) Signed: 01/22/2017 3:31:36 PM By: Christin Fudge MD, FACS Entered By: Christin Fudge on 01/22/2017 09:11:24 Passon, [name] ([id number]) -------------------------------------------------------------------------------- SuperBill Details Patient Name: [name] T. Date of Service: 01/22/2017 Medical Record Number: [id number] Patient Account Number: [id number] Date of Birth/Sex: 20-Feb-1946 (71 y.o. 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 L89.892 Pressure ulcer of other site, stage 2 M70.861 Other soft tissue disorders related to use, overuse and pressure, right lower leg M71.061 Abscess of bursa, right knee Facility Procedures CPT4 Code: [id number] Description: [phone] - WOUND CARE VISIT-LEV 2 EST PT Modifier: Quantity: 1 Physician Procedures CPT4: Description Modifier Quantity Code [phone] [id number] - WC PHYS LEVEL 2 - EST PT 1 ICD-10 Description Diagnosis 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

## 2017-01-26 NOTE — Progress Notes (Signed)
LOWEN, BARRINGER ([id number]) Visit Report for 01/22/2017 Arrival Information Details Patient Name: [name], [name]. Date of Service: 01/22/2017 8:45 AM Medical Record Number: [id number] Patient Account Number: [id number] Date of Birth/Sex: Oct 25, 1945 (71 y.o. Male) Treating RN: Ahmed Prima Primary Care Ayaana Biondo: [name] Dupre Other Clinician: Referring Taylour Lietzke: [name] Dupre Treating Brysan Mcevoy/Extender: Frann Rider in Treatment: 23 Visit Information History Since Last Visit Added or deleted any medications: No Patient Arrived: Wheel Chair Any new allergies or adverse reactions: No Arrival Time: 08:47 Had a fall or experienced change in No activities of daily living that may affect Accompanied By: self risk of falls: Transfer Assistance: Manual Signs or symptoms of abuse/neglect since last No Patient Identification Verified: Yes visito Secondary Verification Process Yes Hospitalized since last visit: No Completed: Has Dressing in Place as Prescribed: Yes Patient Requires Transmission-Based No Pain Present Now: No Precautions: Patient Has Alerts: No Electronic Signature(s) Signed: 01/22/2017 4:51:13 PM By: Alric Quan Entered By: Alric Quan on 01/22/2017 08:48:43 Letendre, [name] ([id number]) -------------------------------------------------------------------------------- Clinic Level of Care Assessment Details Patient Name: [name]. Date of Service: 01/22/2017 8:45 AM Medical Record Number: [id number] Patient Account Number: [id number] Date of Birth/Sex: March 14, 1946 (71 y.o. Male) Treating RN: Ahmed Prima Primary Care Delyle Weider: [name] Dupre Other Clinician: Referring Arash Karstens: [name] Dupre Treating Madox Corkins/Extender: Frann Rider in Treatment: 23 Clinic Level of Care Assessment Items TOOL 4 Quantity Score X - Use when only [name] EandM is performed on FOLLOW-UP visit 1 0 ASSESSMENTS - Nursing Assessment /  Reassessment X - Reassessment of Co-morbidities (includes updates in patient status) 1 10 X - Reassessment of Adherence to Treatment Plan 1 5 ASSESSMENTS - Wound and Skin Assessment / Reassessment X - Simple Wound Assessment / Reassessment - one wound 1 5 []  - Complex Wound Assessment / Reassessment - multiple wounds 0 []  - Dermatologic / Skin Assessment (not related to wound area) 0 ASSESSMENTS - Focused Assessment []  - Circumferential Edema Measurements - multi extremities 0 []  - Nutritional Assessment / Counseling / Intervention 0 []  - Lower Extremity Assessment (monofilament, tuning fork, pulses) 0 []  - Peripheral Arterial Disease Assessment (using hand held doppler) 0 ASSESSMENTS - Ostomy and/or Continence Assessment and Care []  - Incontinence Assessment and Management 0 []  - Ostomy Care Assessment and Management (repouching, etc.) 0 PROCESS - Coordination of Care X - Simple Patient / Family Education for ongoing care 1 15 []  - Complex (extensive) Patient / Family Education for ongoing care 0 []  - Staff obtains Programmer, systems, Records, Test Results / Process Orders 0 []  - Staff telephones HHA, Nursing Homes / Clarify orders / etc 0 []  - Routine Transfer to another Facility (non-emergent condition) 0 [name], [name] ([id number]) []  - Routine Hospital Admission (non-emergent condition) 0 []  - New Admissions / Biomedical engineer / Ordering NPWT, Apligraf, etc. 0 []  - Emergency Hospital Admission (emergent condition) 0 X - Simple Discharge Coordination 1 10 []  - Complex (extensive) Discharge Coordination 0 PROCESS - Special Needs []  - Pediatric / Minor Patient Management 0 []  - Isolation Patient Management 0 []  - Hearing / Language / Visual special needs 0 []  - Assessment of Community assistance (transportation, D/C planning, etc.) 0 []  - Additional assistance / Altered mentation 0 []  - Support Surface(s) Assessment (bed, cushion, seat, etc.) 0 INTERVENTIONS - Wound Cleansing /  Measurement X - Simple Wound Cleansing - one wound 1 5 []  - Complex Wound Cleansing - multiple wounds 0 X - Wound Imaging (photographs - any number of wounds) 1 5 []  -  Wound Tracing (instead of photographs) 0 X - Simple Wound Measurement - one wound 1 5 []  - Complex Wound Measurement - multiple wounds 0 INTERVENTIONS - Wound Dressings []  - Small Wound Dressing one or multiple wounds 0 []  - Medium Wound Dressing one or multiple wounds 0 []  - Large Wound Dressing one or multiple wounds 0 []  - Application of Medications - topical 0 []  - Application of Medications - injection 0 INTERVENTIONS - Miscellaneous []  - External ear exam 0 [name], [name] ([id number]) []  - Specimen Collection (cultures, biopsies, blood, body fluids, etc.) 0 []  - Specimen(s) / Culture(s) sent or taken to Lab for analysis 0 []  - Patient Transfer (multiple staff / Harrel Lemon Lift / Similar devices) 0 []  - Simple Staple / Suture removal (25 or less) 0 []  - Complex Staple / Suture removal (26 or more) 0 []  - Hypo / Hyperglycemic Management (close monitor of Blood Glucose) 0 []  - Ankle / Brachial Index (ABI) - do not check if billed separately 0 X - Vital Signs 1 5 Has the patient been seen at the hospital within the last three years: Yes Total Score: 65 Level Of Care: New/Established - Level 2 Electronic Signature(s) Signed: 01/22/2017 4:51:13 PM By: Alric Quan Entered By: Alric Quan on 01/22/2017 12:41:15 [name], [name] ([id number]) -------------------------------------------------------------------------------- Encounter Discharge Information Details Patient Name: [name] Gathers T. Date of Service: 01/22/2017 8:45 AM Medical Record Number: [id number] Patient Account Number: [id number] Date of Birth/Sex: [birth year] (71 y.o. Male) Treating RN: Ahmed Prima Primary Care Finbar Nippert: [name] Dupre Other Clinician: Referring Marcelline Temkin: [name] Dupre Treating Steffi Noviello/Extender: Frann Rider  in Treatment: 5 Encounter Discharge Information Items Discharge Pain Level: 0 Discharge Condition: Stable Ambulatory Status: Wheelchair Discharge Destination: Nursing Home Transportation: Other Call us if you have Accompanied By: any questions or concerns. Schedule Follow-up No Appointment: Medication Reconciliation completed and provided to No Patient/Care Gaige Fussner: Provided on Clinical Summary of Care: 01/22/2017 Form Type Recipient Paper Patient JE Electronic Signature(s) Signed: 01/26/2017 9:43:33 AM By: Ruthine Dose Entered By: Ruthine Dose on 01/22/2017 09:03:07 Lafoy, [name] ([id number]) -------------------------------------------------------------------------------- General Visit Notes Details Patient Name: [name] Gathers T. Date of Service: 01/22/2017 8:45 AM Medical Record Number: [id number] Patient Account Number: [id number] Date of Birth/Sex: 1945-07-19 (71 y.o. Male) Treating RN: Ahmed Prima Primary Care Artemus Romanoff: [name] Dupre Other Clinician: Montey Hora Referring Jancy Sprankle: [name] Dupre Treating Christa Fasig/Extender: Frann Rider in Treatment: 23 Notes Pt had already been seen in the office and was discharged from wound care. He was out in the waiting room and Adela Lank the receptionist called me and Di Kindle, RN to come to the waiting room. We took his vitals at 09:18 a.m. 116/52, 95%, 79, 22 and then retaken at 09:22 a.m. 122/61, 99%, 73, 22. After the first set of vitals was taken Dr. Con Memos was notified. Pt was c/o 10/10 chest pain. He stated that it started on the right side of his chest and radiated to the mid to left of his chest, and pressure like [name] elephant sitting on his chest. Pt was A/O x4. Pt's face was flushed without diaphoresis. No c/o n/v. Dr. Con Memos instructed [place] to call 911. I spoke to the 911 operator after Adela Lank initially spoke to them and gave them the information on the pt. Shortly after EMS and Fire arrived to the  office, I gave them report. EMS started the EKG, and monitoring pt. Pt was then transported to [facility] ER. Electronic Signature(s) Signed: 01/22/2017 4:51:13 PM By: Alric Quan Entered By: Alric Quan  on 01/22/2017 12:59:51 Medlen, Avyaan TMarland Kitchen ([id number]) -------------------------------------------------------------------------------- Lower Extremity Assessment Details Patient Name: [name], [name] Date of Service: 01/22/2017 8:45 AM Medical Record Number: [id number] Patient Account Number: [id number] Date of Birth/Sex: 28-May-1945 (71 y.o. Male) Treating RN: Ahmed Prima Primary Care Lydiana Milley: [name] Dupre Other Clinician: Referring Tammy Wickliffe: [name] Dupre Treating Andrell Bergeson/Extender: Frann Rider in Treatment: 23 Vascular Assessment Pulses: Dorsalis Pedis Palpable: [Right:Yes] Posterior Tibial Extremity colors, hair growth, and conditions: Extremity Color: [Right:Hyperpigmented] Temperature of Extremity: [Right:Warm] Capillary Refill: [Right:< 3 seconds] Electronic Signature(s) Signed: 01/22/2017 4:51:13 PM By: Alric Quan Entered By: Alric Quan on 01/22/2017 08:55:04 [name], [name] ([id number]) -------------------------------------------------------------------------------- Multi Wound Chart Details Patient Name: [name] Gathers T. Date of Service: 01/22/2017 8:45 AM Medical Record Number: [id number] Patient Account Number: [id number] Date of Birth/Sex: [birth year] (71 y.o. Male) Treating RN: Ahmed Prima Primary Care Kailynne Ferrington: [name] Dupre Other Clinician: Referring Paytyn Mesta: [name] Dupre Treating Luisa Louk/Extender: Frann Rider in Treatment: 23 Vital Signs Height(in): 67 Pulse(bpm): 71 Weight(lbs): 357 Blood Pressure 109/51 (mmHg): Body Mass Index(BMI): 56 Temperature(F): 98.2 Respiratory Rate 16 (breaths/min): Photos: [1:No Photos] [N/A:N/A] Wound Location: [1:Right Knee - Anterior] [N/A:N/A] Wounding  Event: [1:Pressure Injury] [N/A:N/A] Primary Etiology: [1:Diabetic Wound/Ulcer of the Lower Extremity] [N/A:N/A] Comorbid History: [1:Anemia, Asthma, Arrhythmia, Congestive Heart Failure, Coronary Artery Disease, Hypertension, Type II Diabetes, Osteoarthritis] [N/A:N/A] Date Acquired: [1:07/23/2016] [N/A:N/A] Weeks of Treatment: [1:23] [N/A:N/A] Wound Status: [1:Open] [N/A:N/A] Measurements L x W x D 0x0x0 [N/A:N/A] (cm) Area (cm) : [1:0] [N/A:N/A] Volume (cm) : [1:0] [N/A:N/A] % Reduction in Area: [1:100.00%] [N/A:N/A] % Reduction in Volume: 100.00% [N/A:N/A] Classification: [1:Grade 1] [N/A:N/A] Exudate Amount: [1:None Present] [N/A:N/A] Wound Margin: [1:Flat and Intact] [N/A:N/A] Granulation Amount: [1:None Present (0%)] [N/A:N/A] Necrotic Amount: [1:None Present (0%)] [N/A:N/A] Exposed Structures: [1:Fat Layer (Subcutaneous Tissue) Exposed: Yes Fascia: No Tendon: No Muscle: No] [N/A:N/A] Joint: No Bone: No Epithelialization: Large (67-100%) N/A N/A Periwound Skin Texture: Excoriation: No N/A N/A Induration: No Callus: No Crepitus: No Rash: No Scarring: No Periwound Skin Maceration: No N/A N/A Moisture: Dry/Scaly: No Periwound Skin Color: Erythema: Yes N/A N/A Atrophie Blanche: No Cyanosis: No Ecchymosis: No Hemosiderin Staining: No Mottled: No Pallor: No Rubor: No Erythema Location: Circumferential N/A N/A Temperature: No Abnormality N/A N/A Tenderness on Yes N/A N/A Palpation: Wound Preparation: Ulcer Cleansing: N/A N/A Rinsed/Irrigated with Saline Topical Anesthetic Applied: None Treatment Notes Electronic Signature(s) Signed: 01/22/2017 3:31:36 PM By: Christin Fudge MD, FACS Entered By: Christin Fudge on 01/22/2017 09:10:25 [name], [name] ([id number]) -------------------------------------------------------------------------------- [place] Details Patient Name: [name] Gathers T. Date of Service: 01/22/2017 8:45 AM Medical Record  Number: [id number] Patient Account Number: [id number] Date of Birth/Sex: 1946-02-06 (71 y.o. Male) Treating RN: Ahmed Prima Primary Care Yarel Rushlow: [name] Dupre Other Clinician: Referring Balinda Heacock: [name] Dupre Treating Homer Miller/Extender: Frann Rider in Treatment: 41 Active Inactive Electronic Signature(s) Signed: 01/22/2017 4:51:13 PM By: Alric Quan Entered By: Alric Quan on 01/22/2017 08:59:50 [name], [name] ([id number]) -------------------------------------------------------------------------------- Pain Assessment Details Patient Name: [name] Gathers T. Date of Service: 01/22/2017 8:45 AM Medical Record Number: [id number] Patient Account Number: [id number] Date of Birth/Sex: 1945/11/18 (71 y.o. Male) Treating RN: Ahmed Prima Primary Care Joshaua Epple: [name] Dupre Other Clinician: Referring Shamonique Battiste: [name] Dupre Treating Stevi Hollinshead/Extender: Frann Rider in Treatment: 48 Active Problems Location of Pain Severity and Description of Pain Patient Has Paino No Site Locations Pain Management and Medication Current Pain Management: Electronic Signature(s) Signed: 01/22/2017 4:51:13 PM By: Alric Quan Entered By: Alric Quan on 01/22/2017 08:48:56 [name], [name] ([id number]) -------------------------------------------------------------------------------- Patient/Caregiver Education  Details Patient Name: [name], [name]. Date of Service: 01/22/2017 8:45 AM Medical Record Number: [id number] Patient Account Number: [id number] Date of Birth/Gender: July 05, 1945 (71 y.o. Male) Treating RN: Ahmed Prima Primary Care Physician: [name] Dupre Other Clinician: Referring Physician: [name] Dupre Treating Physician/Extender: Frann Rider in Treatment: 57 Education Assessment Education Provided To: Patient Education Topics Provided Wound/Skin Impairment: Handouts: Other: Call us if you have any questions or  concerns. Methods: Explain/Verbal Responses: State content correctly Electronic Signature(s) Signed: 01/22/2017 4:51:13 PM By: Alric Quan Entered By: Alric Quan on 01/22/2017 09:02:04 [name], [name] ([id number]) -------------------------------------------------------------------------------- Wound Assessment Details Patient Name: [name] Gathers T. Date of Service: 01/22/2017 8:45 AM Medical Record Number: [id number] Patient Account Number: [id number] Date of Birth/Sex: April 25, 1946 (71 y.o. Male) Treating RN: Ahmed Prima Primary Care Jasmyn Picha: [name] Dupre Other Clinician: Referring Latronda Spink: [name] Dupre Treating Lynnmarie Lovett/Extender: Frann Rider in Treatment: 23 Wound Status Wound Number: 1 Primary Diabetic Wound/Ulcer of the Lower Etiology: Extremity Wound Location: Right Knee - Anterior Wound Open Wounding Event: Pressure Injury Status: Date Acquired: 07/23/2016 Comorbid Anemia, Asthma, Arrhythmia, Weeks Of Treatment: 23 History: Congestive Heart Failure, Coronary Clustered Wound: No Artery Disease, Hypertension, Type II Diabetes, Osteoarthritis Photos Photo Uploaded By: Alric Quan on 01/22/2017 16:23:21 Wound Measurements Length: (cm) 0 % Reduction in Width: (cm) 0 % Reduction in Depth: (cm) 0 Epithelializati Area: (cm) 0 Tunneling: Volume: (cm) 0 Undermining: Area: 100% Volume: 100% on: Large (67-100%) No No Wound Description Classification: Grade 1 Foul Odor After Wound Margin: Flat and Intact Slough/Fibrino Exudate Amount: None Present Cleansing: No No Wound Bed Granulation Amount: None Present (0%) Exposed Structure Necrotic Amount: None Present (0%) Fascia Exposed: No Fat Layer (Subcutaneous Tissue) Exposed: Yes Tendon Exposed: No Muscle Exposed: No [name], Aniel T. ([id number]) Joint Exposed: No Bone Exposed: No Periwound Skin Texture Texture Color No Abnormalities Noted: No No Abnormalities Noted:  No Callus: No Atrophie Blanche: No Crepitus: No Cyanosis: No Excoriation: No Ecchymosis: No Induration: No Erythema: Yes Rash: No Erythema Location: Circumferential Scarring: No Hemosiderin Staining: No Mottled: No Moisture Pallor: No No Abnormalities Noted: No Rubor: No Dry / Scaly: No Maceration: No Temperature / Pain Temperature: No Abnormality Tenderness on Palpation: Yes Wound Preparation Ulcer Cleansing: Rinsed/Irrigated with Saline Topical Anesthetic Applied: None Electronic Signature(s) Signed: 01/22/2017 4:51:13 PM By: Alric Quan Entered By: Alric Quan on 01/22/2017 08:59:16 Bruinsma, [name] ([id number]) -------------------------------------------------------------------------------- [place] Details Patient Name: [name]. Date of Service: 01/22/2017 8:45 AM Medical Record Number: [id number] Patient Account Number: [id number] Date of Birth/Sex: Dec 08, 1945 (71 y.o. Male) Treating RN: Ahmed Prima Primary Care Gabriellah Rabel: [name] Dupre Other Clinician: Referring Eulice Rutledge: [name] Dupre Treating Kobe Ofallon/Extender: Frann Rider in Treatment: 40 Vital Signs Time Taken: 08:48 Temperature (F): 98.2 Height (in): 67 Pulse (bpm): 71 Weight (lbs): 357 Respiratory Rate (breaths/min): 16 Body Mass Index (BMI): 55.9 Blood Pressure (mmHg): 109/51 Reference Range: 80 - 120 mg / dl Electronic Signature(s) Signed: 01/22/2017 4:51:13 PM By: Alric Quan Entered By: Alric Quan on 01/22/2017 08:50:18

## 2017-09-26 IMAGING — DX DG KNEE COMPLETE 4+V*R*
5 series · 5 of 5 positions shown · non-contrast
Comparison: Radiographs July 20, 2016.

CLINICAL DATA: Right knee wound.

EXAM:
RIGHT KNEE - COMPLETE 4+ VIEW

[knee ap]
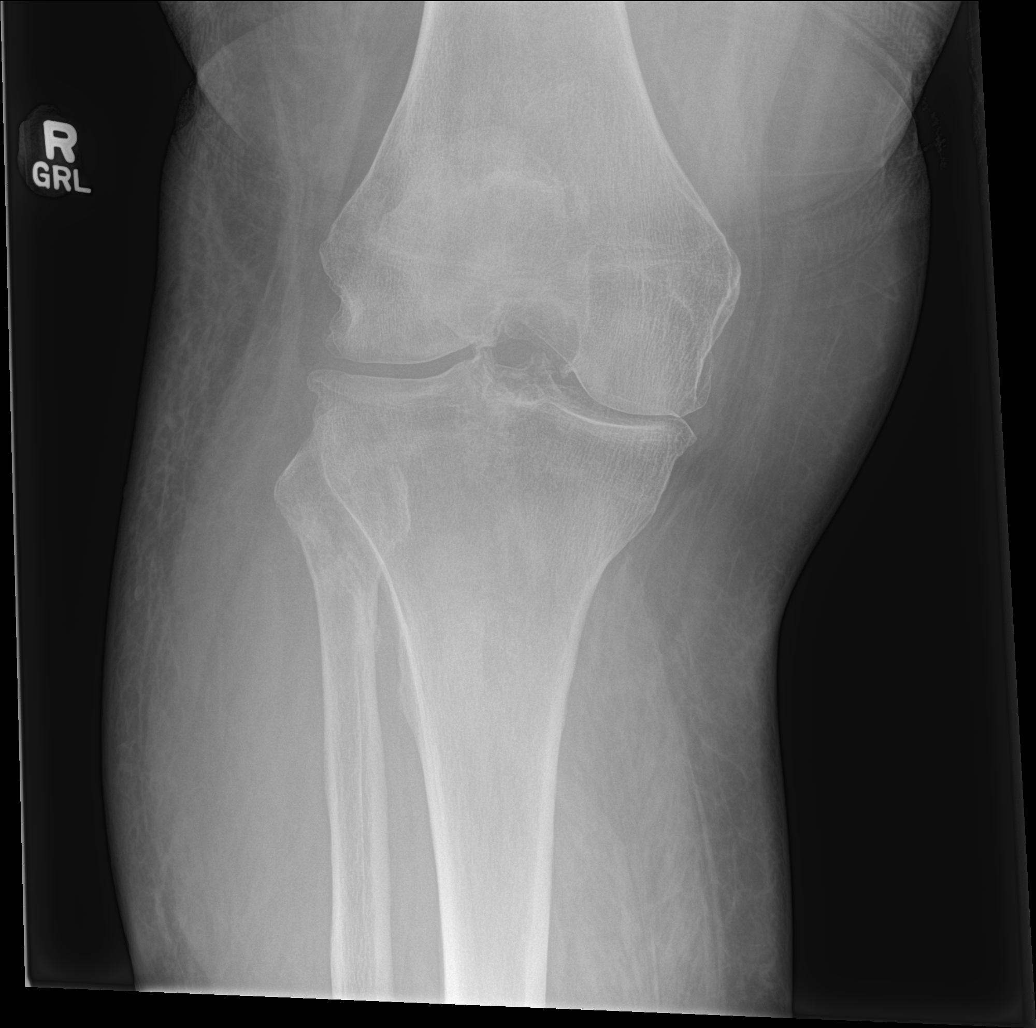

[knee lat]
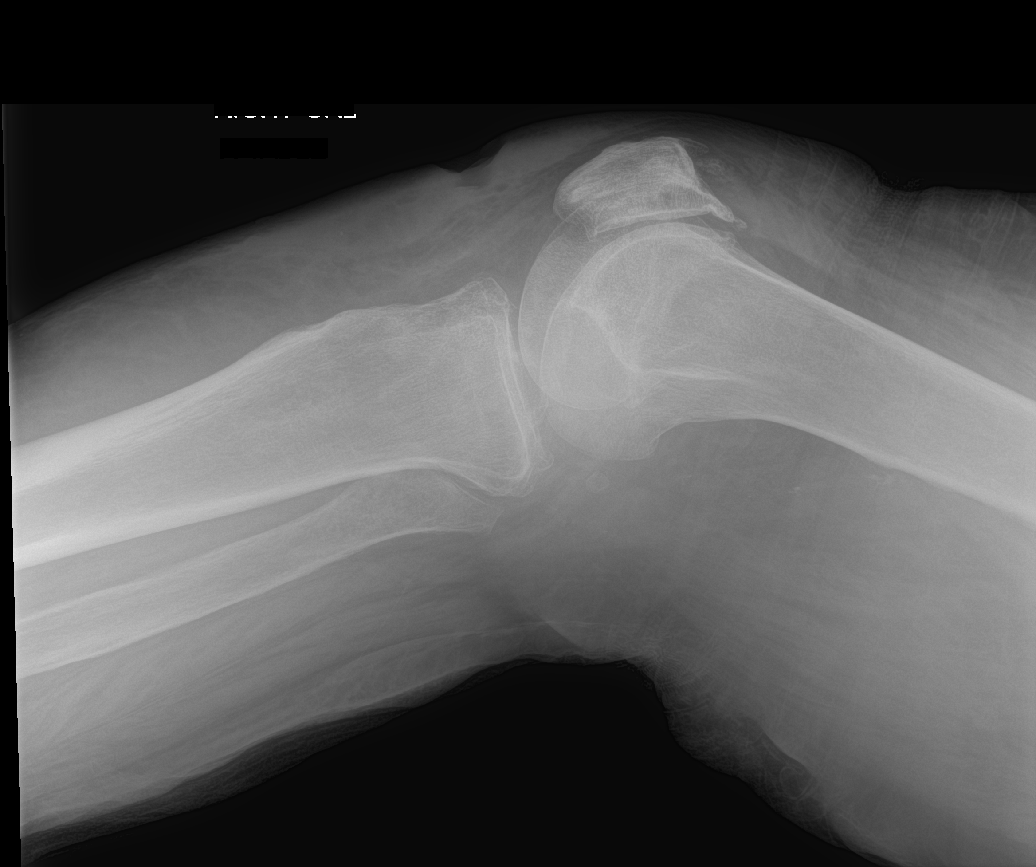

[knee obl (1 of 3)]
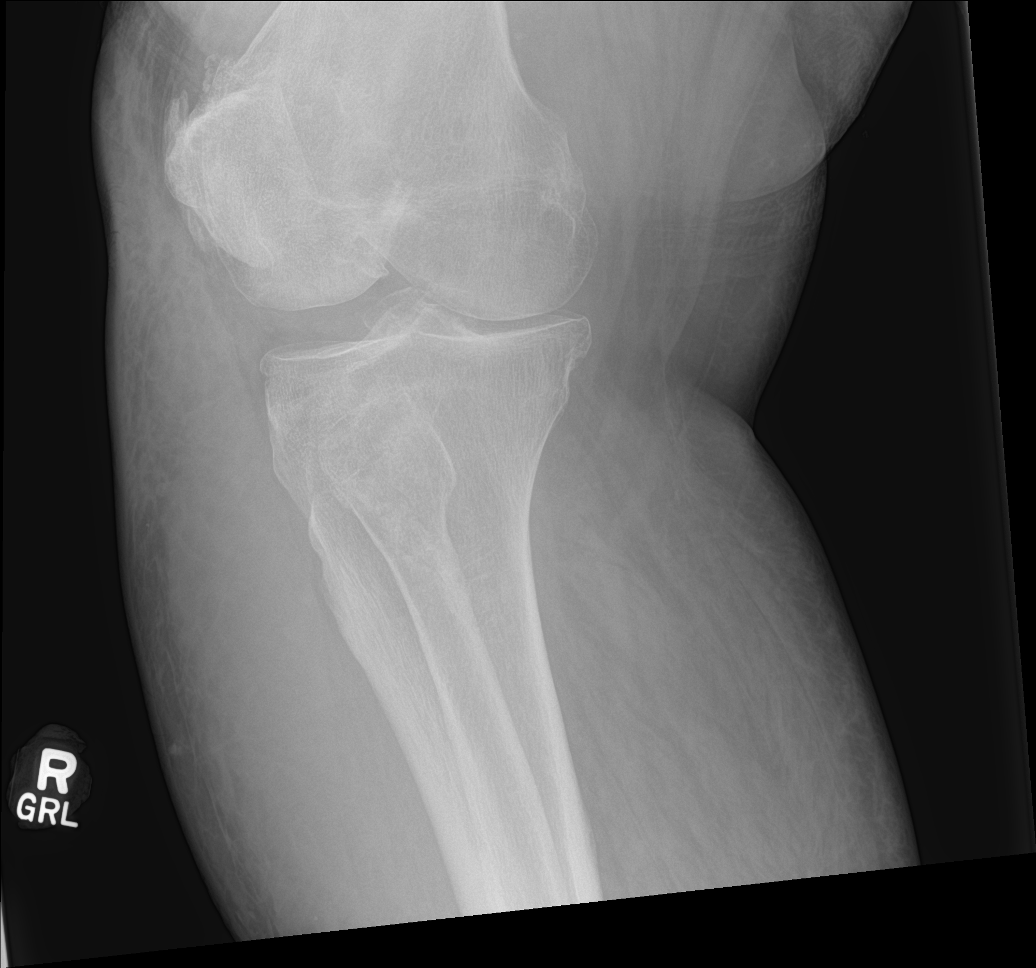

[knee obl (2 of 3)]
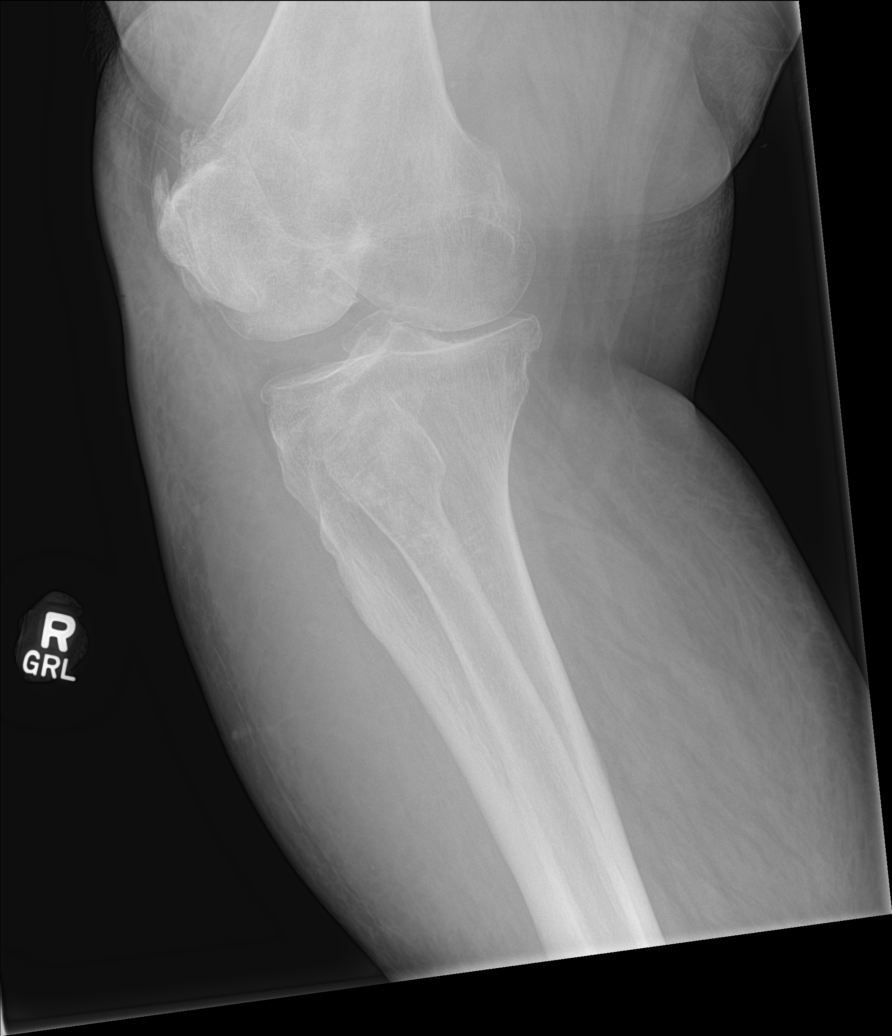

[knee obl (3 of 3)]
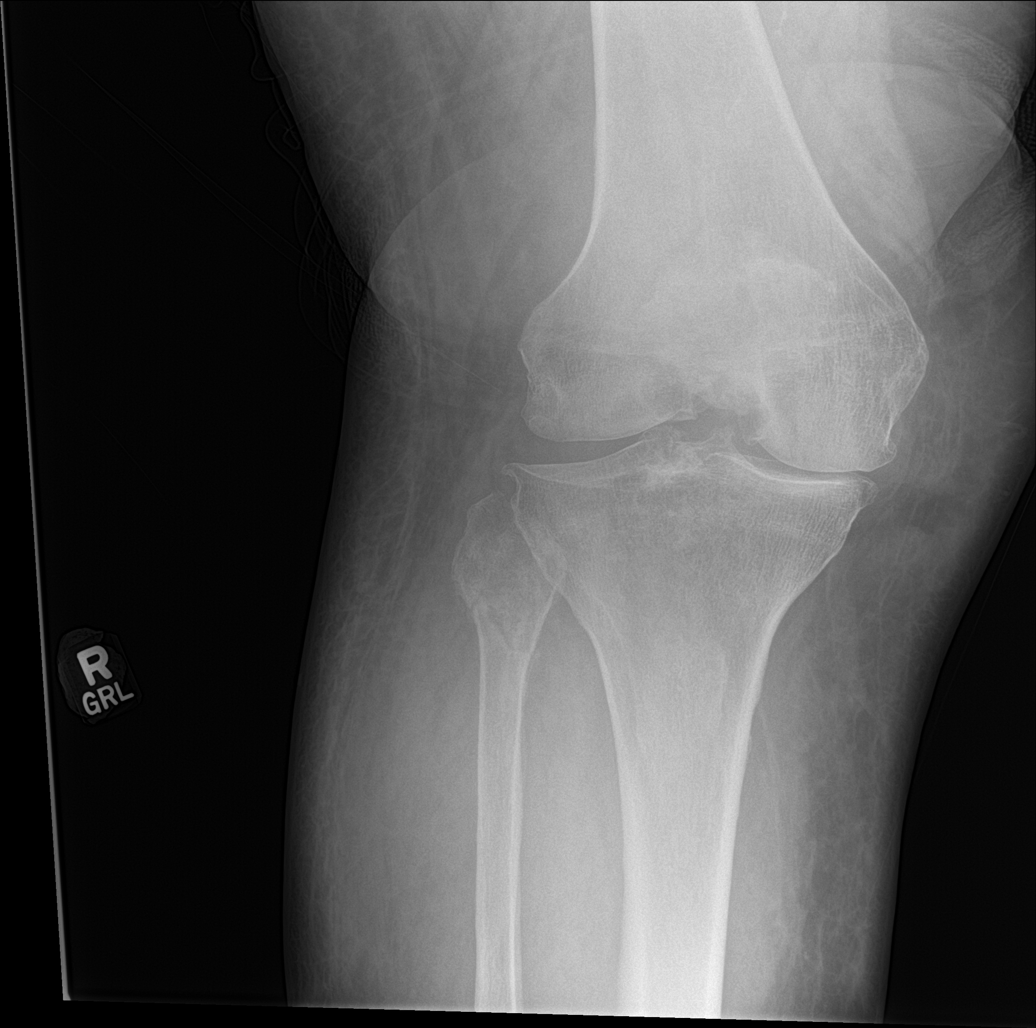

[5 of 5 positions shown; findings below may reference images not displayed]

FINDINGS: Minimally displaced fracture is seen involving the proximal fibula.
Severe narrowing of medial joint space is noted with osteophyte
formation. Moderate narrowing of patellofemoral space is noted with
osteophyte formation. No joint effusion is noted. Soft tissue
ulceration is seen anterior to the knee.
IMPRESSION: Soft tissue ulceration or wound is noted anteriorly. Severe
degenerative joint disease is noted. Minimally displaced proximal
right fibular fracture.

## 2017-09-26 IMAGING — CT CT KNEE*R* W/O CM
3 series · 13 of 20 positions shown, 15 images · non-contrast
Comparison: Radiograph 09/21/2016

CLINICAL DATA: Large open wound below the knee

EXAM:
CT OF THE right KNEE WITHOUT CONTRAST
TECHNIQUE: Multidetector CT imaging of the right knee was performed according
to the standard protocol. Multiplanar CT image reconstructions were
also generated.

[Series 10: axial bone · axial · 0.59mm/px · z∈[-455,-248]mm · 5 of 208 slices shown, 7 images]
[im 35/208  soft-tissue]
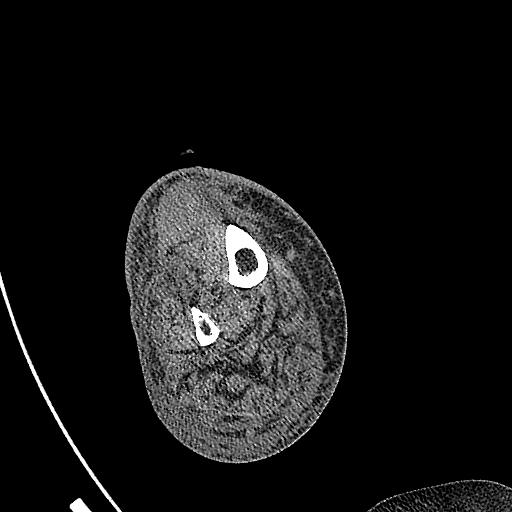
[im 35/208  bone]
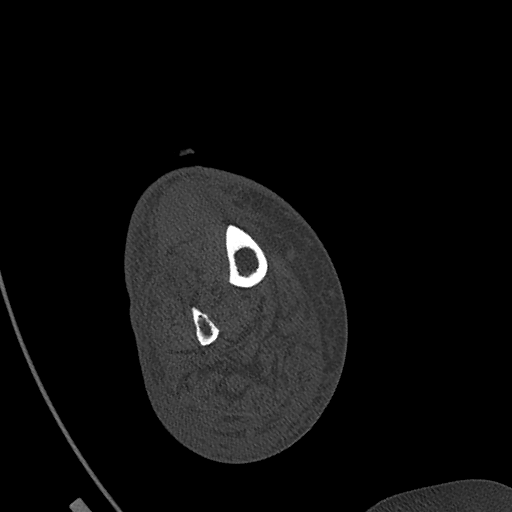
[im 70/208  bone]
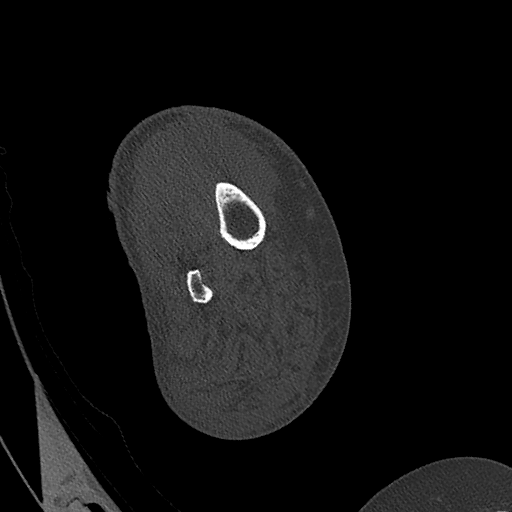
[im 104/208  bone]
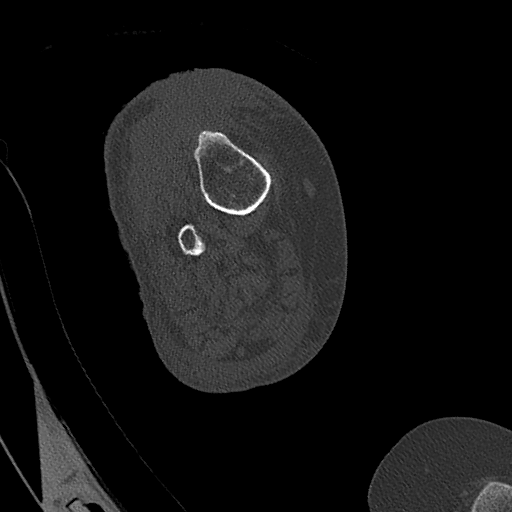
[im 139/208  bone]
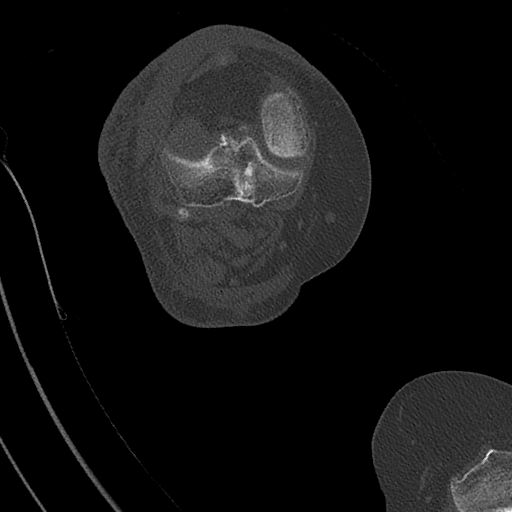
[im 173/208  soft-tissue]
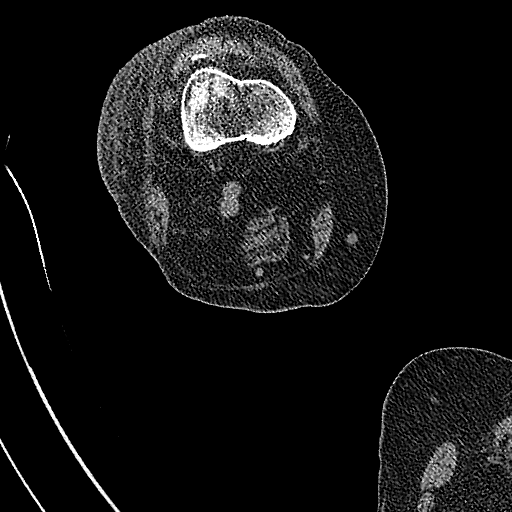
[im 173/208  bone]
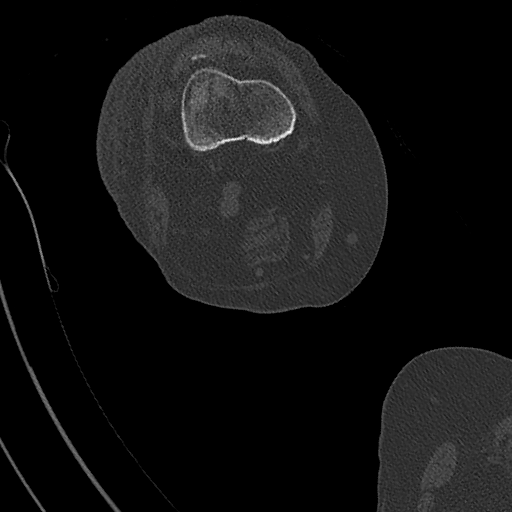

[Series 11: ax axial st · axial · 0.59mm/px · z∈[-450,-245]mm · 5 of 207 slices shown]
[im 35/207  bone]
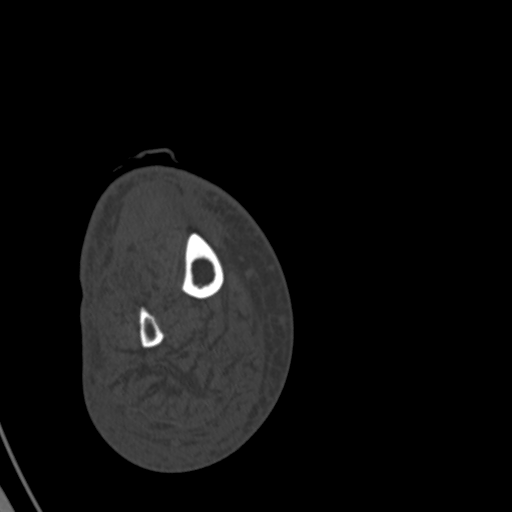
[im 69/207  bone]
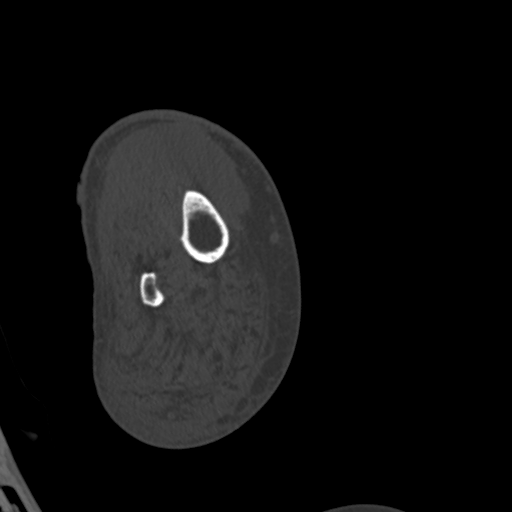
[im 104/207  bone]
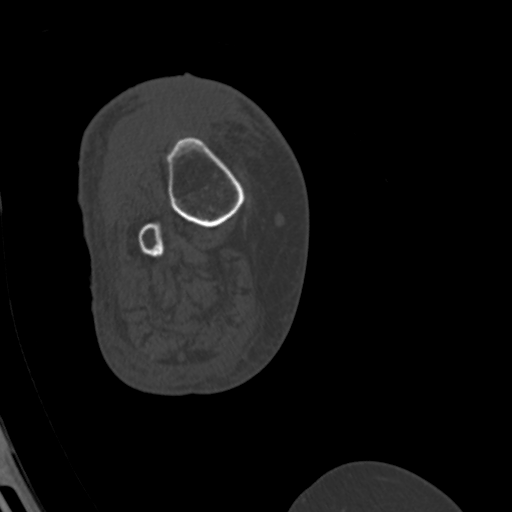
[im 138/207  bone]
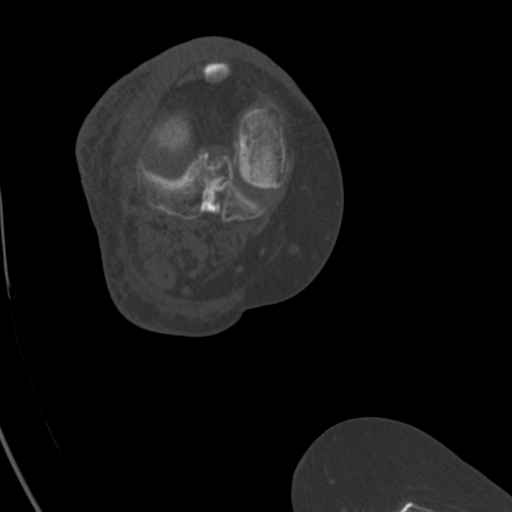
[im 172/207  bone]
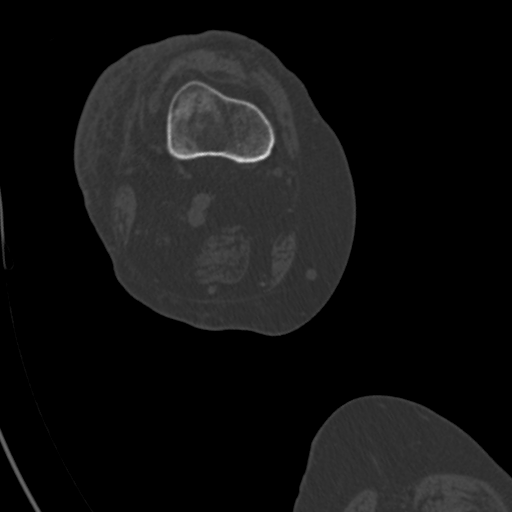

[Series 12: sagittal st · coronal · 0.61mm/px · 3 of 128 slices shown]
[im 26/128  bone]
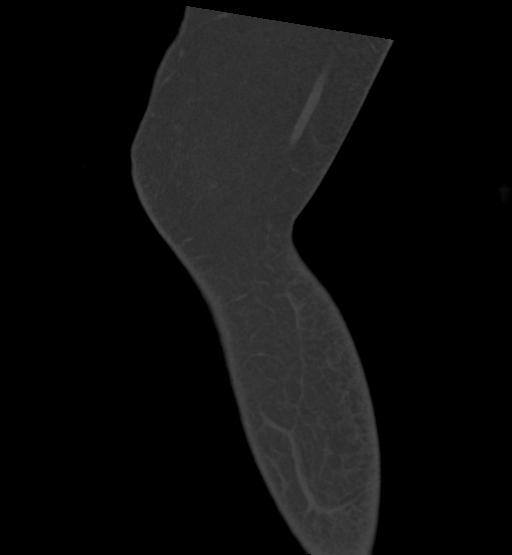
[im 51/128  bone]
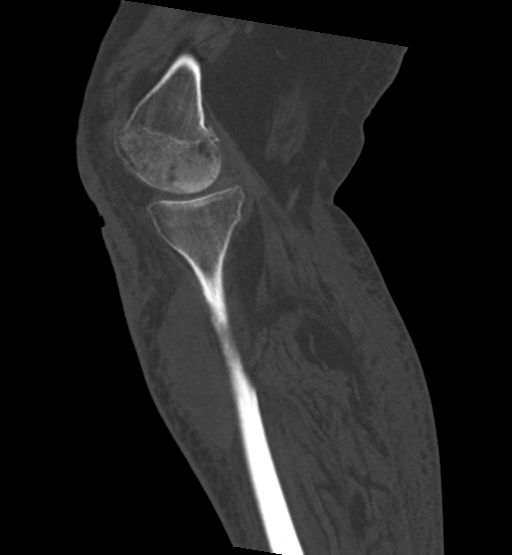
[im 77/128  bone]
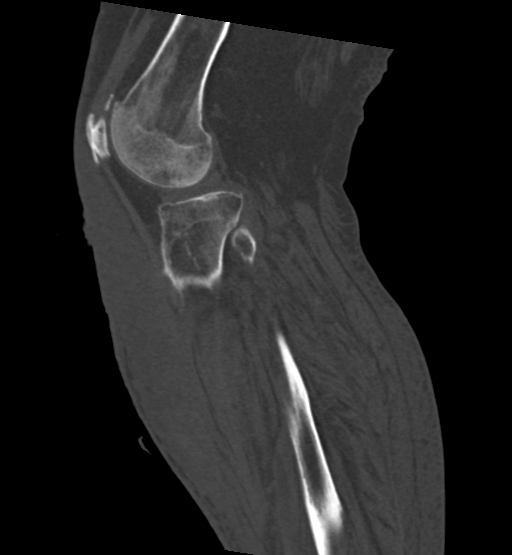

[13 of 20 positions shown; findings below may reference images not displayed]

FINDINGS: Bones/Joint/Cartilage

Healing nondisplaced proximal fibular fracture with sclerotic
margins. Moderate severe narrowing of the medial joint space
compartment with subchondral sclerosis and bony spurring. Mild
degenerative changes of the lateral compartment with bony spurring.
Moderate to marked patellofemoral degenerative changes greater on
the lateral side with medial and lateral osteophytes. Superior and
inferior patellar spurring. No large suprapatellar effusion. No
obvious calcified loose bodies within the joint.

Ligaments

Suboptimally assessed by CT.

Muscles and Tendons

Diffuse fatty atrophy of the distal thigh and proximal calf muscles.
Quadriceps tendon grossly intact. Patellar tendon grossly intact.

Soft tissues

Large amount of soft tissue edema and skin thickening anterior to
the patella, lower knee, and proximal tibia and fibula. Soft tissue
ulceration slightly inferior to the patella with scattered gas
pockets in the anterior soft tissues. Large mildly hyperdense
collection within the subcutaneous soft tissues anteriorly, this
begins approximately at the inferior aspect of the patella and
extends inferiorly to about the midshaft of the tibia. This measures
approximately 20 more cm craniocaudad by 3.8 cm AP by 7.4 cm
transverse.

There is diffuse subcutaneous edema within the posterior as well.
IMPRESSION: 1. Skin thickening and soft tissue ulceration anteriorly, inferior
to the patella. Large mildly dense fluid collection anteriorly,
spanning from the inferior aspect of the patella to the midshaft of
the tibia, suspect that this may represent a hematoma or possible
infected hemorrhagic fluid collection. Further evaluation limited
without contrast.
2. Healing nondisplaced proximal fibular fracture. No periostitis or
bony erosive changes.
3. Moderate severe arthritis of the right knee.

## 2019-08-23 ENCOUNTER — Non-Acute Institutional Stay: Payer: Medicare Other | Admitting: Nurse Practitioner

## 2019-08-23 ENCOUNTER — Encounter: Payer: Self-pay | Admitting: Nurse Practitioner

## 2019-08-23 ENCOUNTER — Other Ambulatory Visit: Payer: Self-pay

## 2019-08-23 VITALS — BP 150/72 | HR 65 | Temp 97.0°F | Resp 18 | Wt 295.2 lb

## 2019-08-23 DIAGNOSIS — Z515 Encounter for palliative care: Secondary | ICD-10-CM

## 2019-08-23 DIAGNOSIS — I509 Heart failure, unspecified: Secondary | ICD-10-CM

## 2019-08-23 NOTE — Progress Notes (Signed)
[facility] Palliative Care Consult Note Telephone: [phone]  Fax: [phone]  PATIENT NAME: [name] DOB: May 29, 1945 MRN: [id number]  PRIMARY CARE PROVIDER: Dr Yves Dill PROVIDER:  Dr Hodges/[facility] RESPONSIBLE PARTY:   [name] daughter [id number]  I was asked by Dr Nyra Capes to see Mr. [name] for Palliative care consult for goals of care  RECOMMENDATIONS and PLAN:  1. ACP: Full code with aggressive interventions, call pending to daughter  2. Palliative care encounter; Palliative medicine team will continue to support patient, patient's family, and medical team. Visit consisted of counseling and education dealing with the complex and emotionally intense issues of symptom management and palliative care in the setting of serious and potentially life-threatening illness   I spent 70 minutes providing this consultation,  from 11:50am to 1:00pm. More than 50% of the time in this consultation was spent coordinating communication.   HISTORY OF PRESENT ILLNESS:  [name] is a 74 y.o. year old male with multiple medical problems including Chronic respiratory failure with hypoxia, chronic diastolic congestive heart failure, coronary artery disease s/p aortic coronary bypass graft, cerebral infarction, non elevated st-myocardial infarction, 02 dependent, peripheral vascular disease, COPD, morbid severe obesity, diabetes, hypertension, hypothyroidism, iron deficiency anemia, chronic kidney disease, BPH, gout, vitamin D deficiency, gerd, allergies, insomnia, schizoaffective disorder bipolar type. [name] resides at [place] at [facility]. Mr Bommer is bed bound, requires staff to turn position, transfer mobility. Mr Sela is total ADL dependence with episodes of incontinence. [name] does feed himself after tray set up and appetite has been fair. Mr.  Wambach remains oxygen dependent. No recent falls, wounds. He is followed by [facility]. Last Primary Provider note by [employer] nurse practitioner 3 / 9 / 2021 for diabetes with diabetic peripheral angiopathy without gangrenous. Wishes are for full code and full scope of aggressive interventions. Hemoglobin A1c at goal < 9%. No episodes of hypoglycemia. Blood sugars have been controlled. No symptoms of ulcerations and Podiatry does follow at the facility. [name] is able to verbalize some needs, other times he is yelling and screaming per staff. At present [name] is lying in bed, appears chronically ill, debilitated, no distress. No visitors present. I visited and observed [name]. We talked about purpose of palliative care visit. Mr Groeber with cognitive impairment was limited understanding. We talked about how he was feeling today. [name] endorses that he is doing okay. [name] denies pain or shortness of breath. [name] currently was wearing his oxygen on the side of his face. [name] was cooperative with replacing oxygen and proper position. Mr Hurford was cooperative with assessment. [name] began yelling non sensible words during palliative care visit. Mr Stankevich was consoled though when left room return to yelling. Emotional support provided. I have attempted to contact [name] daughter, [name], message left with contact information. I have updated nursing staff. No further changes at present time to goals are plan of care until further discussion with daughter.   Palliative Care was asked to help address goals of care.   CODE STATUS: Full code  PPS: 30% HOSPICE ELIGIBILITY/DIAGNOSIS: TBD  PAST MEDICAL HISTORY:  Past Medical History:  Diagnosis Date  . Abscess 10/08/2016  . Abscess of right leg excluding foot   . Acute on chronic renal failure ([place]) 08/23/2016  . Acute on chronic respiratory failure ([place]) 08/23/2016  . AKI  (  acute kidney injury) ([place]) 08/27/2016  . Arthritis   . Asthma   . Bipolar 1 disorder ([name]) 07/23/2016   Last Assessment & Plan:  On multiple meds including geodon and seems to be stable.    [name] CAD (coronary artery disease) 04/27/2016  . Cellulitis 09/21/2016  . Chest pain, rule out acute myocardial infarction 02/16/2016  . CHF (congestive heart failure) ([place])   . Chronic bronchitis ([place]) 07/23/2016   Last Assessment & Plan:  Breathing at baseline now with some chronic sob.   . Current use of proton pump inhibitor 08/23/2016  . Demand ischemia ([name]) 08/27/2016  . Diabetes ([name])   . DVT (deep venous thrombosis) ([place]) 08/27/2016  . Electrolyte imbalance 08/23/2016  . Elevated troponin 08/27/2016  . Heart trouble   . History of asthma 08/23/2016  . History of bipolar disorder 08/23/2016  . History of chronic pain 08/23/2016  . History of gastric ulcer 08/23/2016  . History of stomach ulcers   . History of stroke 07/23/2016   Last Assessment & Plan:  On asa/plavix so won't increase asa for now re dvt prevention  . HTN (hypertension) 04/27/2016  . Hypertension   . Lactic acidosis 08/23/2016  . 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 Obesity, unspecified 11/22/2013  . OSA (obstructive sleep apnea) 08/23/2016  . Pulmonary embolism ([place]) 08/23/2016  . Reactive airway disease 11/22/2013  . S/P CABG x 2   . SIRS (systemic inflammatory response syndrome) ([place]) 08/23/2016  . Skin cancer   . Stroke [facility])     SOCIAL HX:  Social History   Tobacco Use  . Smoking status: Current Some Day Smoker    Packs/day: 0.25    Types: Cigarettes    Last attempt to quit: 03/15/1963    Years since quitting: 56.4  . Smokeless tobacco: Never Used  Substance Use Topics  . Alcohol use: No    ALLERGIES:  Allergies  Allergen Reactions  . Dilaudid [Hydromorphone Hcl] Other (See Comments)    "CANNOT HEAR, CANNOT THINK"    . Hydrocodone-Acetaminophen Nausea And Vomiting  . Hydromorphone Nausea Only    Vomiting, diarrhea.  . Morphine And Related Other  (See Comments)    Patient was told by a doctor that this "would kill" him  . Tetracycline Other (See Comments)    Patient was told by a doctor that this "would kill" him  . Tetracyclines & Related Other (See Comments)    Patient doesn't recall the reaction  . Penicillins Rash    Has patient had a PCN reaction causing immediate rash, facial/tongue/throat swelling, SOB or lightheadedness with hypotension: Yes Has patient had a PCN reaction causing severe rash involving mucus membranes or skin necrosis: No Has patient had a PCN reaction that required hospitalization No Has patient had a PCN reaction occurring within the last 10 years: No If all of the above answers are "NO", then may proceed with Cephalosporin use.   . Sulfa Antibiotics Rash     PERTINENT MEDICATIONS:  Outpatient Encounter Medications as of 08/23/2019  Medication Sig  . acetaminophen (TYLENOL) 325 MG tablet Take 650 mg by mouth every 4 (four) hours as needed (general discomfort).  [name] albuterol (PROAIR HFA) 108 (90 Base) MCG/ACT inhaler Inhale 2 puffs into the lungs every 4 (four) hours as needed for wheezing or shortness of breath.  [name] amLODipine (NORVASC) 10 MG tablet Take 10 mg by mouth daily.   [name] aspirin 81 MG EC tablet Chew 81 mg by mouth daily.  [name] buPROPion [facility] SR)  100 MG 12 hr tablet Take 100 mg by mouth daily.   . colchicine 0.6 MG tablet Take 0.6 mg by mouth daily.  [name] doxepin (SINEQUAN) 25 MG capsule Take 25 mg by mouth at bedtime.   . febuxostat (ULORIC) 40 MG tablet Take 40 mg by mouth daily.  . fluticasone (FLONASE) 50 MCG/ACT nasal spray Place 1 spray into both nostrils daily.  . Fluticasone-Salmeterol (ADVAIR) 100-50 MCG/DOSE AEPB Inhale 1 puff into the lungs 2 (two) times daily.  . furosemide (LASIX) 40 MG tablet Take 40 mg by mouth 2 (two) times daily.  [name] ipratropium-albuterol (DUONEB) 0.5-2.5 (3) MG/3ML SOLN Inhale 3 mLs into the lungs every 4 (four) hours as needed (shortness of breath/wheezing).   .  iron polysaccharides (NIFEREX) 150 MG capsule Take 150 mg by mouth daily.  [name] levothyroxine (SYNTHROID, LEVOTHROID) 100 MCG tablet Take 100 mcg by mouth daily before breakfast.   . meclizine (ANTIVERT) 25 MG tablet Take 25 mg by mouth 2 (two) times daily as needed for dizziness.   . metolazone (ZAROXOLYN) 5 MG tablet Take 5 mg by mouth daily.  . metoprolol (LOPRESSOR) 50 MG tablet Take 50 mg by mouth 2 (two) times daily.   . montelukast (SINGULAIR) 10 MG tablet Take 1 tablet by mouth at bedtime.   . pantoprazole (PROTONIX) 40 MG tablet Take 40 mg by mouth daily.   . potassium chloride SA (K-DUR,KLOR-CON) 20 MEQ tablet Take 40 mEq by mouth daily.   . rivaroxaban (XARELTO) 20 MG TABS tablet Take 1 tablet (20 mg total) by mouth daily with supper.  . senna-docusate (SENOKOT-S) 8.6-50 MG tablet Take 2 tablets by mouth daily.  . Suvorexant (BELSOMRA) 10 MG TABS Take 10 mg by mouth at bedtime.  . ziprasidone (GEODON) 80 MG capsule Take 80 mg by mouth 2 (two) times daily with a meal.   No facility-administered encounter medications on file as of 08/23/2019.    PHYSICAL EXAM:   General: obese, debilitated, chronically ill, male Cardiovascular: regular rate and rhythm Pulmonary: clear ant fields Abdomen: soft, nontender, + bowel sounds Extremities: + edema, no joint deformities Neurological: functional quadriplegic  Persephone Schriever Ihor Gully, NP

## 2019-08-25 ENCOUNTER — Telehealth: Payer: Self-pay | Admitting: Nurse Practitioner

## 2019-08-25 NOTE — Telephone Encounter (Signed)
I called Belva Agee, Mr. Younkin daughter. We talked about purpose of palliative care visit.Carmen in agreement. We talked about palliative care visit with Mr. Forstner. We talked about the last time he was independent at home which was several years ago. We talked about the transition to [place] at [facility]. We talked about his functional level prior to hospitalization, short-term rehab now long-term care and comparison to currently. We talked extensively about his mental health, bipolar and history of suicidal attempts with multiple hospitalizations. We talked at length about the challenges having a mentally ill parent. We talked about Mr. Feinberg current behaviors, agitation. [name] endorses Mr. Sunn has been followed by the same psychiatrist for many many years who knows him very well but hasn't been able to see him due to covid pandemic. We talked about the concerns with behaviors. We talked about Psychiatry at the facility and Psychotherapy. Discussed with [name] option seeing if it would be available to have facility Psychiatry and Psychotherapy see and follow with ongoing contact with his psychiatrist that's known him for many years. Discuss that will check with facility Psychiatry to see if that is an option. We talked about symptoms. We talked about chronic disease progression of congestive heart failure, coronary artery disease, COPD. We talked about Mr. Caswell debility. We talked about medical goals of care. We talked about aggressive versus conservative versus comfort care. We talked about code status as he currently is a full code. [name] endorses she will revisit code status with her sister who shares Healthcare power of attorney with her. We talked about role of palliative care and plan of care. Discuss that will follow up in 1 week with palliative care visit for further discussion of medical goals of care. Carmen in agreement.  Questions answered the satisfaction. Contact information provided. Therapeutic listening and emotional support provided.   Total time spent 45 minute Phone discussion 35 minutes Documentation 10 minutes

## 2019-08-30 ENCOUNTER — Encounter: Payer: Self-pay | Admitting: Nurse Practitioner

## 2019-08-30 ENCOUNTER — Other Ambulatory Visit: Payer: Self-pay

## 2019-08-30 ENCOUNTER — Non-Acute Institutional Stay: Payer: Medicare Other | Admitting: Nurse Practitioner

## 2019-08-30 VITALS — BP 150/72 | HR 65 | Temp 97.5°F | Resp 18 | Wt 295.0 lb

## 2019-08-30 DIAGNOSIS — Z515 Encounter for palliative care: Secondary | ICD-10-CM

## 2019-08-30 DIAGNOSIS — I509 Heart failure, unspecified: Secondary | ICD-10-CM

## 2019-08-30 NOTE — Progress Notes (Signed)
[facility] Palliative Care Consult Note Telephone: [phone]  Fax: [phone]  PATIENT NAME: [name] DOB: 11-10-1945 MRN: [id number] PRIMARY CARE PROVIDER: Dr Yves Dill PROVIDER:  Dr Hodges/[facility] RESPONSIBLE PARTY:   Gerrick Bok daughter [id number]  I was asked by Dr Nyra Capes to see [name] for Palliative care consult for goals of care  RECOMMENDATIONS and PLAN:  1. ACP: Full code with aggressive interventions, call pending to daughter  2. Palliative care encounter; Palliative medicine team will continue to support patient, patient's family, and medical team. Visit consisted of counseling and education dealing with the complex and emotionally intense issues of symptom management and palliative care in the setting of serious and potentially life-threatening illness   I spent 47 minutes providing this consultation,  from 12:15pm to 1:02pm. More than 50% of the time in this consultation was spent coordinating communication.   HISTORY OF PRESENT ILLNESS:  [name] is a 74 y.o. year old male with multiple medical problems including Chronic respiratory failure with hypoxia, chronic diastolic congestive heart failure, coronary artery disease s/p aortic coronary bypass graft, cerebral infarction, non elevated st-myocardial infarction, 02 dependent, peripheral vascular disease, COPD, morbid severe obesity, diabetes, hypertension, hypothyroidism, iron deficiency anemia, chronic kidney disease, BPH, gout, vitamin D deficiency, gerd, allergies, insomnia, schizoaffective disorder bipolar type.[name] continues to reside at [place] at [facility]. [name] does continue remain bed-bound, total ADL dependence for transferring, mobility, toileting. [name] does feed himself and appetite varies. Follow up palliative care visit today to revisit medical goals  of care and further discussion with [name], Mr Marolda daughter about code status, Psychiatry. Since [name] has been seen by [facility] though her documentation daughter wishes for him to be transferred back to Dr. Kasandra Knudsen his previous psychiatrist he's been following him for some time. At present [name] his lying in bed, he appears chronically ill, debilitated, confused. No visitors present. I visited and observed [name]. We talked about purpose for palliative care visit though limited verbal discussion with cognitive impairment. Mr Rodd was cooperative with assessment. Emotional support provided. Medical goals review. Discuss with psychiatric nurse practitioner that does come to [facility] Psychiatry versus in facility Psychiatry. Psychiatric nurse practitioner endorses that he would have to forgo his private psychiatrist if followed by facility Psychiatry and Psychotherapy. Insurance would not pay for both so she could consult private psychiatrist for initial collaboration of care, history as he is been followed by his private psychiatrist for many years. I called and left a message for [name], Mr Rossow daughter for update on previous conversation and information about facility Psychiatry with Psychotherapy. After the nursing staff many changes at present times will wait for a return call from [facility] with further discussion of medical goals of care  Palliative Care was asked to help to continue to address goals of care.   CODE STATUS: Full code  PPS: 30% HOSPICE ELIGIBILITY/DIAGNOSIS: TBD  PAST MEDICAL HISTORY:  Past Medical History:  Diagnosis Date  . Abscess 10/08/2016  . Abscess of right leg excluding foot   . Acute on chronic renal failure ([place]) 08/23/2016  . Acute on chronic respiratory failure ([place]) 08/23/2016  . AKI (acute kidney injury) ([place]) 08/27/2016  . Arthritis   . Asthma   . Bipolar 1 disorder ([place]) 07/23/2016   Last  Assessment & Plan:  On multiple meds including geodon and  seems to be stable.    [name] CAD (coronary artery disease) 04/27/2016  . Cellulitis 09/21/2016  . Chest pain, rule out acute myocardial infarction 02/16/2016  . CHF (congestive heart failure) ([place])   . Chronic bronchitis ([place]) 07/23/2016   Last Assessment & Plan:  Breathing at baseline now with some chronic sob.   . Current use of proton pump inhibitor 08/23/2016  . Demand ischemia ([place]) 08/27/2016  . Diabetes ([place])   . DVT (deep venous thrombosis) ([place]) 08/27/2016  . Electrolyte imbalance 08/23/2016  . Elevated troponin 08/27/2016  . Heart trouble   . History of asthma 08/23/2016  . History of bipolar disorder 08/23/2016  . History of chronic pain 08/23/2016  . History of gastric ulcer 08/23/2016  . History of stomach ulcers   . History of stroke 07/23/2016   Last Assessment & Plan:  On asa/plavix so won't increase asa for now re dvt prevention  . HTN (hypertension) 04/27/2016  . Hypertension   . Lactic acidosis 08/23/2016  . 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 Obesity, unspecified 11/22/2013  . OSA (obstructive sleep apnea) 08/23/2016  . Pulmonary embolism ([place]) 08/23/2016  . Reactive airway disease 11/22/2013  . S/P CABG x 2   . SIRS (systemic inflammatory response syndrome) ([place]) 08/23/2016  . Skin cancer   . Stroke [facility])     SOCIAL HX:  Social History   Tobacco Use  . Smoking status: Current Some Day Smoker    Packs/day: 0.25    Types: Cigarettes    Last attempt to quit: 03/15/1963    Years since quitting: 56.4  . Smokeless tobacco: Never Used  Substance Use Topics  . Alcohol use: No    ALLERGIES:  Allergies  Allergen Reactions  . Dilaudid [Hydromorphone Hcl] Other (See Comments)    "CANNOT HEAR, CANNOT THINK"    . Hydrocodone-Acetaminophen Nausea And Vomiting  . Hydromorphone Nausea Only    Vomiting, diarrhea.  . Morphine And Related Other (See Comments)    Patient was told by a doctor that this "would kill" him  . Tetracycline Other (See  Comments)    Patient was told by a doctor that this "would kill" him  . Tetracyclines & Related Other (See Comments)    Patient doesn't recall the reaction  . Penicillins Rash    Has patient had a PCN reaction causing immediate rash, facial/tongue/throat swelling, SOB or lightheadedness with hypotension: Yes Has patient had a PCN reaction causing severe rash involving mucus membranes or skin necrosis: No Has patient had a PCN reaction that required hospitalization No Has patient had a PCN reaction occurring within the last 10 years: No If all of the above answers are "NO", then may proceed with Cephalosporin use.   . Sulfa Antibiotics Rash     PERTINENT MEDICATIONS:  Outpatient Encounter Medications as of 08/30/2019  Medication Sig  . acetaminophen (TYLENOL) 325 MG tablet Take 650 mg by mouth every 4 (four) hours as needed (general discomfort).  [name] albuterol (PROAIR HFA) 108 (90 Base) MCG/ACT inhaler Inhale 2 puffs into the lungs every 4 (four) hours as needed for wheezing or shortness of breath.  [name] amLODipine (NORVASC) 10 MG tablet Take 10 mg by mouth daily.   [name] aspirin 81 MG EC tablet Chew 81 mg by mouth daily.  [name] buPROPion (WELLBUTRIN SR) 100 MG 12 hr tablet Take 100 mg by mouth daily.   . colchicine 0.6 MG tablet Take 0.6 mg by mouth daily.  [name] doxepin (SINEQUAN) 25 MG capsule Take 25  mg by mouth at bedtime.   . febuxostat (ULORIC) 40 MG tablet Take 40 mg by mouth daily.  . fluticasone (FLONASE) 50 MCG/ACT nasal spray Place 1 spray into both nostrils daily.  . Fluticasone-Salmeterol (ADVAIR) 100-50 MCG/DOSE AEPB Inhale 1 puff into the lungs 2 (two) times daily.  . furosemide (LASIX) 40 MG tablet Take 40 mg by mouth 2 (two) times daily.  [name] ipratropium-albuterol (DUONEB) 0.5-2.5 (3) MG/3ML SOLN Inhale 3 mLs into the lungs every 4 (four) hours as needed (shortness of breath/wheezing).   . iron polysaccharides (NIFEREX) 150 MG capsule Take 150 mg by mouth daily.  [name] levothyroxine (SYNTHROID,  LEVOTHROID) 100 MCG tablet Take 100 mcg by mouth daily before breakfast.   . meclizine (ANTIVERT) 25 MG tablet Take 25 mg by mouth 2 (two) times daily as needed for dizziness.   . metolazone (ZAROXOLYN) 5 MG tablet Take 5 mg by mouth daily.  . metoprolol (LOPRESSOR) 50 MG tablet Take 50 mg by mouth 2 (two) times daily.   . montelukast (SINGULAIR) 10 MG tablet Take 1 tablet by mouth at bedtime.   . pantoprazole (PROTONIX) 40 MG tablet Take 40 mg by mouth daily.   . potassium chloride SA (K-DUR,KLOR-CON) 20 MEQ tablet Take 40 mEq by mouth daily.   . rivaroxaban (XARELTO) 20 MG TABS tablet Take 1 tablet (20 mg total) by mouth daily with supper.  . senna-docusate (SENOKOT-S) 8.6-50 MG tablet Take 2 tablets by mouth daily.  . Suvorexant (BELSOMRA) 10 MG TABS Take 10 mg by mouth at bedtime.  . ziprasidone (GEODON) 80 MG capsule Take 80 mg by mouth 2 (two) times daily with a meal.   No facility-administered encounter medications on file as of 08/30/2019.    PHYSICAL EXAM:   General: obese, debilitated, chronically ill, O2 dependent male Cardiovascular: regular rate and rhythm Pulmonary: clear ant fields Abdomen: soft, nontender, + bowel sounds Extremities: + edema, no joint deformities Neurological: functional quadriplegic  Sumiye Hirth Ihor Gully, NP

## 2019-09-08 ENCOUNTER — Non-Acute Institutional Stay: Payer: Medicare Other | Admitting: Nurse Practitioner

## 2019-09-08 ENCOUNTER — Other Ambulatory Visit: Payer: Self-pay

## 2019-09-08 ENCOUNTER — Encounter: Payer: Self-pay | Admitting: Nurse Practitioner

## 2019-09-08 VITALS — BP 150/72 | HR 65 | Temp 97.5°F | Resp 18 | Wt 295.0 lb

## 2019-09-08 DIAGNOSIS — Z515 Encounter for palliative care: Secondary | ICD-10-CM

## 2019-09-08 DIAGNOSIS — I509 Heart failure, unspecified: Secondary | ICD-10-CM

## 2019-09-08 NOTE — Progress Notes (Signed)
[facility] Consult Note Telephone: [phone]  Fax: [phone]  PATIENT NAME: [name] DOB: [birth year] MRN: [id number]  PRIMARY CARE PROVIDER:Dr Yves Dill PROVIDER:Dr Hodges/[place] daughter [id number]  RECOMMENDATIONS and PLAN: 1. ACP: changed to DNR placed in [facility]  2. Palliative care encounter; Palliative medicine team will continue to support patient, patient's family, and medical team. Visit consisted of counseling and education dealing with the complex and emotionally intense issues of symptom management and palliative care in the setting of serious and potentially life-threatening illness  I spent 70 minutes providing this consultation,  from 2:15pm to 3:20pm. More than 50% of the time in this consultation was spent coordinating communication.   HISTORY OF PRESENT ILLNESS:  [name] is a 74 y.o. year old male with multiple medical problems including Chronic respiratory failure with hypoxia, chronic diastolic congestive heart failure, coronary artery disease s/p aortic coronary bypass graft, cerebral infarction, non elevated st-myocardial infarction, 02 dependent, peripheral vascular disease, COPD, morbid severe obesity, diabetes, hypertension, hypothyroidism, iron deficiency anemia, chronic kidney disease, BPH, gout, vitamin D deficiency, gerd, allergies, insomnia, schizoaffective disorder bipolar type. [name] continues to reset it [name] at [facility]. [name] is maximum dependents for mobility and transfers. [name] is total ADL dependence. [name] does feed himself and appetite has varied fair to poor. Staff endorses they continue to have intermittent difficulty with yelling atstaff another residence. No recent hospitalizations, wounds, infections. At present time [name]  is lying in bed, appears debilitated, obese, chronically ill, 02 dependent, no distress. I visited and observe [name]. [name] opened his eyes to verbal cues. Asked if he was having symptoms of shortness of breath or pain and he replied no. [name] quickly return to sleeping. [name] was cooperative with assessment. Emotional support provided. I called [name], [name] daughter for update on palliative care visit. [name] and I talked about last palliative discussion concerning facility Psychiatry and Dr. Minta Balsam and I talked about further discussion with Dr. Kasandra [name] about transferring Psychiatric care to in house psychiatry as since it is difficult to bring [name] to an appointment. Dr. Kasandra [name] visit was telemedicine. [name] and I talked about if in-house Psychiatry were to follow Dr. Kasandra [name] would have to discharge with the possibility if he should become more mobile and easier to bring to an appointment can re-establish with Dr. Kasandra [name]. We also talked about Psychotherapy. Discussed information received from psychotherapist that he can receive Services if he is followed by an outside [place] in agreement to proceed with Psychotherapy. We talked about medical goals of care. We talked about chronic disease progression. We talked about his poor overall clinical condition, O2 dependents, debility and decline in functional ability as well as cognitive with worsening behaviors. We reviewed medication list, dosages. We talked about role of palliative care and plan of care. We talked about follow-up visit in 4 We talked about follow-up visit in 4 weeks if needed or sooner said he declined. [name] in agreement and is aware that if she and her sister decide to proceed with Psychiatry at the facility to notify facility and palliative can initiate order. Therapeutic listening and emotional support provided. Questions answered the satisfaction. Contact information provided. [place] form completed  and brought to [facility] to be placed in [name] chart, updated staff Palliative Care was asked to help to continue  to address goals of care.   CODE STATUS: DNR  PPS: 30% HOSPICE ELIGIBILITY/DIAGNOSIS: TBD  PAST MEDICAL HISTORY:  Past Medical History:  Diagnosis Date   Abscess 10/08/2016   Abscess of right leg excluding foot    Acute on chronic renal failure ([place]) 08/23/2016   Acute on chronic respiratory failure ([place]) 08/23/2016   AKI (acute kidney injury) ([place]) 08/27/2016   Arthritis    Asthma    Bipolar 1 disorder ([place]) 07/23/2016   Last Assessment & Plan:  On multiple meds including geodon and seems to be stable.     CAD (coronary artery disease) 04/27/2016   Cellulitis 09/21/2016   Chest pain, rule out acute myocardial infarction 02/16/2016   CHF (congestive heart failure) (HCC)    Chronic bronchitis ([place]) 07/23/2016   Last Assessment & Plan:  Breathing at baseline now with some chronic sob.    Current use of proton pump inhibitor 08/23/2016   Demand ischemia ([place]) 08/27/2016   Diabetes ([place])    DVT (deep venous thrombosis) ([place]) 08/27/2016   Electrolyte imbalance 08/23/2016   Elevated troponin 08/27/2016   Heart trouble    History of asthma 08/23/2016   History of bipolar disorder 08/23/2016   History of chronic pain 08/23/2016   History of gastric ulcer 08/23/2016   History of stomach ulcers    History of stroke 07/23/2016   Last Assessment & Plan:  On asa/plavix so won't increase asa for now re dvt prevention   HTN (hypertension) 04/27/2016   Hypertension    Lactic acidosis 08/23/2016   MRSA carrier 08/27/2016   Obesity, unspecified 11/22/2013   OSA (obstructive sleep apnea) 08/23/2016   Pulmonary embolism ([place]) 08/23/2016   Reactive airway disease 11/22/2013   S/P CABG x 2    SIRS (systemic inflammatory response syndrome) ([place]) 08/23/2016   Skin cancer    Stroke [facility])     SOCIAL HX:  Social History   Tobacco Use   Smoking status: Current Some  Day Smoker    Packs/day: 0.25    Types: Cigarettes    Last attempt to quit: 03/15/1963    Years since quitting: 56.5   Smokeless tobacco: Never Used  Substance Use Topics   Alcohol use: No    ALLERGIES:  Allergies  Allergen Reactions   Dilaudid [Hydromorphone Hcl] Other (See Comments)    "CANNOT HEAR, CANNOT THINK"     Hydrocodone-Acetaminophen Nausea And Vomiting   Hydromorphone Nausea Only    Vomiting, diarrhea.   Morphine And Related Other (See Comments)    Patient was told by a doctor that this "would kill" him   Tetracycline Other (See Comments)    Patient was told by a doctor that this "would kill" him   Tetracyclines & Related Other (See Comments)    Patient doesn't recall the reaction   Penicillins Rash    Has patient had a PCN reaction causing immediate rash, facial/tongue/throat swelling, SOB or lightheadedness with hypotension: Yes Has patient had a PCN reaction causing severe rash involving mucus membranes or skin necrosis: No Has patient had a PCN reaction that required hospitalization No Has patient had a PCN reaction occurring within the last 10 years: No If all of the above answers are "NO", then may proceed with Cephalosporin use.    Sulfa Antibiotics Rash     PERTINENT MEDICATIONS:  Outpatient Encounter Medications as of 09/08/2019  Medication Sig   acetaminophen (TYLENOL) 325 MG tablet Take 650 mg by mouth every 4 (four) hours as needed (general  discomfort).   albuterol (PROAIR HFA) 108 (90 Base) MCG/ACT inhaler Inhale 2 puffs into the lungs every 4 (four) hours as needed for wheezing or shortness of breath.   amLODipine (NORVASC) 10 MG tablet Take 5 mg by mouth daily.    aspirin 81 MG EC tablet Chew 81 mg by mouth daily.   buPROPion (WELLBUTRIN SR) 100 MG 12 hr tablet Take 100 mg by mouth daily.    colchicine 0.6 MG tablet Take 0.6 mg by mouth daily.   denosumab (PROLIA) 60 MG/ML SOSY injection Inject 60 mg into the skin every 6 (six)  months.   doxepin (SINEQUAN) 25 MG capsule Take 25 mg by mouth at bedtime.    febuxostat (ULORIC) 40 MG tablet Take 40 mg by mouth daily.   FLUoxetine (PROZAC) 40 MG capsule Take 40 mg by mouth daily.   fluticasone (FLONASE) 50 MCG/ACT nasal spray Place 1 spray into both nostrils daily.   Fluticasone-Salmeterol (ADVAIR) 100-50 MCG/DOSE AEPB Inhale 1 puff into the lungs 2 (two) times daily.   furosemide (LASIX) 40 MG tablet Take 40 mg by mouth 2 (two) times daily.   ipratropium-albuterol (DUONEB) 0.5-2.5 (3) MG/3ML SOLN Inhale 3 mLs into the lungs every 4 (four) hours as needed (shortness of breath/wheezing).    iron polysaccharides (NIFEREX) 150 MG capsule Take 150 mg by mouth daily.   levothyroxine (SYNTHROID, LEVOTHROID) 100 MCG tablet Take 100 mcg by mouth daily before breakfast.    lisinopril (ZESTRIL) 2.5 MG tablet Take 2.5 mg by mouth daily.   lurasidone (LATUDA) 80 MG TABS tablet Take 120 mg by mouth daily with breakfast.   magnesium oxide (MAG-OX) 400 MG tablet Take 400 mg by mouth daily.   meclizine (ANTIVERT) 25 MG tablet Take 25 mg by mouth 2 (two) times daily as needed for dizziness.    metolazone (ZAROXOLYN) 5 MG tablet Take 5 mg by mouth daily.   metoprolol (LOPRESSOR) 50 MG tablet Take 50 mg by mouth 2 (two) times daily.    montelukast (SINGULAIR) 10 MG tablet Take 1 tablet by mouth at bedtime.    Multiple Vitamin (MULTIVITAMIN WITH MINERALS) TABS tablet Take 1 tablet by mouth daily.   nitroGLYCERIN (NITROSTAT) 0.4 MG SL tablet Place 0.4 mg under the tongue every 5 (five) minutes as needed for chest pain.   pantoprazole (PROTONIX) 40 MG tablet Take 40 mg by mouth daily.    potassium chloride (KLOR-CON) 20 MEQ packet Take 20 mEq by mouth 2 (two) times daily.   potassium chloride SA (K-DUR,KLOR-CON) 20 MEQ tablet Take 40 mEq by mouth daily.    rivaroxaban (XARELTO) 20 MG TABS tablet Take 1 tablet (20 mg total) by mouth daily with supper.   rosuvastatin  (CRESTOR) 10 MG tablet Take 10 mg by mouth daily.   senna-docusate (SENOKOT-S) 8.6-50 MG tablet Take 2 tablets by mouth daily.   Suvorexant (BELSOMRA) 10 MG TABS Take 15 mg by mouth at bedtime.    Vitamin D, Ergocalciferol, (DRISDOL) 1.25 MG (50000 UNIT) CAPS capsule Take 50,000 Units by mouth every 14 (fourteen) days.   ziprasidone (GEODON) 80 MG capsule Take 80 mg by mouth 2 (two) times daily with a meal.   No facility-administered encounter medications on file as of 09/08/2019.    PHYSICAL EXAM:   General: obese, chronically ill, debilitated confused male Cardiovascular: regular rate and rhythm Pulmonary: clear ant fields Extremities: + edema, no joint deformities Neurological: bedbound  Elizer Bostic Ihor Gully, NP

## 2019-10-18 ENCOUNTER — Encounter: Payer: Self-pay | Admitting: Emergency Medicine

## 2019-10-18 ENCOUNTER — Emergency Department
Admission: EM | Admit: 2019-10-18 | Discharge: 2019-10-18 | Disposition: A | Discharge status: Home / Self Care | Payer: Medicare Other | Attending: Emergency Medicine | Admitting: Emergency Medicine

## 2019-10-18 ENCOUNTER — Emergency Department: Payer: Medicare Other

## 2019-10-18 DIAGNOSIS — N183 Chronic kidney disease, stage 3 unspecified: Secondary | ICD-10-CM | POA: Insufficient documentation

## 2019-10-18 DIAGNOSIS — I130 Hypertensive heart and chronic kidney disease with heart failure and stage 1 through stage 4 chronic kidney disease, or unspecified chronic kidney disease: Secondary | ICD-10-CM | POA: Insufficient documentation

## 2019-10-18 DIAGNOSIS — X58XXXA Exposure to other specified factors, initial encounter: Secondary | ICD-10-CM | POA: Insufficient documentation

## 2019-10-18 DIAGNOSIS — I5032 Chronic diastolic (congestive) heart failure: Secondary | ICD-10-CM | POA: Insufficient documentation

## 2019-10-18 DIAGNOSIS — Z7901 Long term (current) use of anticoagulants: Secondary | ICD-10-CM | POA: Diagnosis not present

## 2019-10-18 DIAGNOSIS — Z85828 Personal history of other malignant neoplasm of skin: Secondary | ICD-10-CM | POA: Diagnosis not present

## 2019-10-18 DIAGNOSIS — E119 Type 2 diabetes mellitus without complications: Secondary | ICD-10-CM | POA: Insufficient documentation

## 2019-10-18 DIAGNOSIS — Z79899 Other long term (current) drug therapy: Secondary | ICD-10-CM | POA: Diagnosis not present

## 2019-10-18 DIAGNOSIS — Y9389 Activity, other specified: Secondary | ICD-10-CM | POA: Insufficient documentation

## 2019-10-18 DIAGNOSIS — S61412A Laceration without foreign body of left hand, initial encounter: Secondary | ICD-10-CM | POA: Diagnosis not present

## 2019-10-18 DIAGNOSIS — J45909 Unspecified asthma, uncomplicated: Secondary | ICD-10-CM | POA: Insufficient documentation

## 2019-10-18 DIAGNOSIS — I2510 Atherosclerotic heart disease of native coronary artery without angina pectoris: Secondary | ICD-10-CM | POA: Insufficient documentation

## 2019-10-18 DIAGNOSIS — Y999 Unspecified external cause status: Secondary | ICD-10-CM | POA: Diagnosis not present

## 2019-10-18 DIAGNOSIS — Y92129 Unspecified place in nursing home as the place of occurrence of the external cause: Secondary | ICD-10-CM | POA: Insufficient documentation

## 2019-10-18 MED ORDER — CEPHALEXIN 500 MG PO CAPS
500.0000 mg | ORAL_CAPSULE | Freq: Once | ORAL | Status: AC
  Administered 2019-10-18: 500 mg via ORAL
  Filled 2019-10-18: qty 1

## 2019-10-18 MED ORDER — MORPHINE SULFATE (PF) 4 MG/ML IV SOLN
8.0000 mg | Freq: Once | INTRAVENOUS | Status: DC
  Filled 2019-10-18: qty 2

## 2019-10-18 MED ORDER — CEPHALEXIN 500 MG PO CAPS: 500.0000 mg | ORAL_CAPSULE | Freq: Four times a day (QID) | ORAL | 0 refills | Status: AC

## 2019-10-18 NOTE — ED Triage Notes (Signed)
Pt arrived via EMS from [facility] health care after pts left hand was lacerated by the hoyer lift. Pt has psych hx and is known to yell and act out with wording.

## 2019-10-18 NOTE — ED Notes (Signed)
Pt yelling and cursing at staff, using vulgar language. Pt moved into room 1 with bed alarm set.

## 2019-10-18 NOTE — Discharge Instructions (Addendum)
Mr. Wheatley has a deep laceration between the thumb and index finger of his left hand.  Fortunately there are no broken bones.  He would not allow for sutures or any sort of IV or injection of medication.  We were not able to reach his daughter [name].  Please continue his regular medications and give him the prescribed antibiotics as listed.  Keep his hand clean and dry as much as possible and leave the Steri-Strips in place.  Follow-up with his regular doctor within a few days and or with the hand specialist listed in the documentation above.    Return to the emergency department if you develop new or worsening symptoms that concern you.

## 2019-10-18 NOTE — ED Notes (Signed)
NAD noted at time of D/C. Pt taken back to [facility] via [facility] at this time.

## 2019-10-18 NOTE — ED Provider Notes (Signed)
[facility] Emergency Department Provider Note  ____________________________________________   First MD Initiated Contact with Patient 10/18/19 0404     (approximate)  I have reviewed the triage vital signs and the nursing notes.   HISTORY  Chief Complaint Laceration  Level 5 caveat: The patient's history and physical exam are limited by his chronic psychiatric issues, lack of cooperation, and combative behavior as well as being a vague historian.  HPI [name] is a 74 y.o. 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  His most recent palliative care note was from about a month ago and they noted that he is DNR and that his daughter, [name], is a Sport and exercise psychologist.  He presents tonight by EMS for evaluation of an injury to his left hand.  Reportedly the patient was acting out towards the skilled nursing staff as they were trying to lift him and [facility] lift and somehow it resulted in an injury to his left hand.  It was reported as a skin tear that was bandaged.  The patient says that his hand hurts but it is difficult to get any additional details from him.  He frequently degenerates or regresses into yelling at staff and providers and refusing care.  He requires frequent and gentle redirection.        Past Medical History:  Diagnosis Date  . Abscess 10/08/2016  . Abscess of right leg excluding foot   . Acute on chronic renal failure ([place]) 08/23/2016  . Acute on chronic respiratory failure ([place]) 08/23/2016  . AKI (acute kidney injury) ([place]) 08/27/2016  . Arthritis   . Asthma   . Bipolar 1 disorder ([place]) 07/23/2016   Last Assessment & Plan:  On multiple meds including geodon and seems to be stable.    [name] CAD (coronary artery disease) 04/27/2016  . Cellulitis 09/21/2016  . Chest pain, rule out acute myocardial  infarction 02/16/2016  . CHF (congestive heart failure) ([place])   . Chronic bronchitis ([place]) 07/23/2016   Last Assessment & Plan:  Breathing at baseline now with some chronic sob.   . Current use of proton pump inhibitor 08/23/2016  . Demand ischemia ([place]) 08/27/2016  . Diabetes ([place])   . DVT (deep venous thrombosis) ([place]) 08/27/2016  . Electrolyte imbalance 08/23/2016  . Elevated troponin 08/27/2016  . Heart trouble   . History of asthma 08/23/2016  . History of bipolar disorder 08/23/2016  . History of chronic pain 08/23/2016  . History of gastric ulcer 08/23/2016  . History of stomach ulcers   . History of stroke 07/23/2016   Last Assessment & Plan:  On asa/plavix so won't increase asa for now re dvt prevention  . HTN (hypertension) 04/27/2016  . Hypertension   . Lactic acidosis 08/23/2016  . 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 Obesity, unspecified 11/22/2013  . OSA (obstructive sleep apnea) 08/23/2016  . Pulmonary embolism ([place]) 08/23/2016  . Reactive airway disease 11/22/2013  . S/P CABG x 2   . SIRS (systemic inflammatory response syndrome) ([place]) 08/23/2016  . Skin cancer   . Stroke [facility])     Patient Active Problem List   Diagnosis Date Noted  . Abscess 10/08/2016  . Abscess of right leg excluding foot   . Cellulitis 09/21/2016  . AKI (acute kidney injury) ([place]) 08/27/2016  . Demand ischemia ([place]) 08/27/2016  . Elevated troponin 08/27/2016  . MRSA carrier  08/27/2016  . DVT (deep venous thrombosis) ([place]) 08/27/2016  . Pressure injury of skin 08/24/2016  . Pulmonary embolism ([place]) 08/23/2016  . SIRS (systemic inflammatory response syndrome) ([place]) 08/23/2016  . Acute on chronic renal failure ([place]) 08/23/2016  . Acute on chronic respiratory failure ([place]) 08/23/2016  . Lactic acidosis 08/23/2016  . Electrolyte imbalance 08/23/2016  . Current use of proton pump inhibitor 08/23/2016  . OSA (obstructive sleep apnea) 08/23/2016  . History of bipolar disorder 08/23/2016  . History of gastric ulcer 08/23/2016  .  History of asthma 08/23/2016  . History of chronic pain 08/23/2016  . Acute pulmonary embolism ([place]) 08/23/2016  . Age-related osteoporosis with current pathological fracture with routine healing 07/23/2016  . Bipolar 1 disorder ([place]) 07/23/2016  . Chronic bronchitis ([place]) 07/23/2016  . History of stroke 07/23/2016  . Chest pain 07/14/2016  . UTI (urinary tract infection) 04/27/2016  . CAD (coronary artery disease) 04/27/2016  . Diabetes ([place]) 04/27/2016  . HTN (hypertension) 04/27/2016  . Chronic diastolic CHF (congestive heart failure) ([place]) 04/27/2016  . CKD (chronic kidney disease), stage III 04/27/2016  . Chest pain, rule out acute myocardial infarction 02/16/2016  . Obesity, unspecified 11/22/2013  . Reactive airway disease 11/22/2013    Past Surgical History:  Procedure Laterality Date  . CARDIAC SURGERY    . CHOLECYSTECTOMY    . GALLBLADDER SURGERY    . HEART BYPASS    . HERNIA REPAIR    . IR ANGIOGRAM PULMONARY BILATERAL SELECTIVE  08/23/2016  . IR ANGIOGRAM SELECTIVE EACH ADDITIONAL VESSEL  08/23/2016  . IR ANGIOGRAM SELECTIVE EACH ADDITIONAL VESSEL  08/23/2016  . IR INFUSION THROMBOL ARTERIAL INITIAL (MS)  08/23/2016  . IR INFUSION THROMBOL ARTERIAL INITIAL (MS)  08/23/2016  . IR THROMB F/U EVAL ART/VEN FINAL DAY (MS)  08/24/2016  . IR US GUIDE VASC ACCESS RIGHT  08/23/2016  . KIDNEY SURGERY      Prior to Admission medications   Medication Sig Start Date End Date Taking? Authorizing Provider  acetaminophen (TYLENOL) 325 MG tablet Take 650 mg by mouth every 4 (four) hours as needed (general discomfort).    [provider]  albuterol (PROAIR HFA) 108 (90 Base) MCG/ACT inhaler Inhale 2 puffs into the lungs every 4 (four) hours as needed for wheezing or shortness of breath.    [provider]  amLODipine (NORVASC) 10 MG tablet Take 5 mg by mouth daily.  05/31/13   [provider]  aspirin 81 MG EC tablet Chew 81 mg by mouth daily.    [provider]   buPROPion (WELLBUTRIN SR) 100 MG 12 hr tablet Take 100 mg by mouth daily.  05/31/13   [provider]  cephALEXin (KEFLEX) 500 MG capsule Take 1 capsule (500 mg total) by mouth 4 (four) times daily. 10/18/19   Hinda Kehr, MD  colchicine 0.6 MG tablet Take 0.6 mg by mouth daily.    [provider]  denosumab (PROLIA) 60 MG/ML SOSY injection Inject 60 mg into the skin every 6 (six) months.    [provider]  doxepin (SINEQUAN) 25 MG capsule Take 25 mg by mouth at bedtime.     [provider]  febuxostat (ULORIC) 40 MG tablet Take 40 mg by mouth daily.    [provider]  FLUoxetine (PROZAC) 40 MG capsule Take 40 mg by mouth daily.    [provider]  fluticasone (FLONASE) 50 MCG/ACT nasal spray Place 1 spray into both nostrils daily.    [provider]  Fluticasone-Salmeterol (ADVAIR) 100-50 MCG/DOSE AEPB Inhale 1 puff into the lungs 2 (two) times daily.    [provider]  furosemide (LASIX) 40 MG tablet Take 40 mg by mouth 2 (two) times daily.    [provider]  ipratropium-albuterol (DUONEB) 0.5-2.5 (3) MG/3ML SOLN Inhale 3 mLs into the lungs every 4 (four) hours as needed (shortness of breath/wheezing).  05/01/16   [provider]  iron polysaccharides (NIFEREX) 150 MG capsule Take 150 mg by mouth daily.    [provider]  levothyroxine (SYNTHROID, LEVOTHROID) 100 MCG tablet Take 100 mcg by mouth daily before breakfast.  05/31/13   [provider]  lisinopril (ZESTRIL) 2.5 MG tablet Take 2.5 mg by mouth daily.    [provider]  lurasidone (LATUDA) 80 MG TABS tablet Take 120 mg by mouth daily with breakfast.    [provider]  magnesium oxide (MAG-OX) 400 MG tablet Take 400 mg by mouth daily.    [provider]  meclizine (ANTIVERT) 25 MG tablet Take 25 mg by mouth 2 (two) times daily as needed for dizziness.  05/31/13   [provider]  metolazone  (ZAROXOLYN) 5 MG tablet Take 5 mg by mouth daily.    [provider]  metoprolol (LOPRESSOR) 50 MG tablet Take 50 mg by mouth 2 (two) times daily.  05/31/13   [provider]  montelukast (SINGULAIR) 10 MG tablet Take 1 tablet by mouth at bedtime.     [provider]  Multiple Vitamin (MULTIVITAMIN WITH MINERALS) TABS tablet Take 1 tablet by mouth daily.    [provider]  nitroGLYCERIN (NITROSTAT) 0.4 MG SL tablet Place 0.4 mg under the tongue every 5 (five) minutes as needed for chest pain.    [provider]  pantoprazole (PROTONIX) 40 MG tablet Take 40 mg by mouth daily.     [provider]  potassium chloride (KLOR-CON) 20 MEQ packet Take 20 mEq by mouth 2 (two) times daily.    [provider]  potassium chloride SA (K-DUR,KLOR-CON) 20 MEQ tablet Take 40 mEq by mouth daily.     [provider]  rivaroxaban (XARELTO) 20 MG TABS tablet Take 1 tablet (20 mg total) by mouth daily with supper. 09/23/16   Bettey Costa, MD  rosuvastatin (CRESTOR) 10 MG tablet Take 10 mg by mouth daily.    [provider]  senna-docusate (SENOKOT-S) 8.6-50 MG tablet Take 2 tablets by mouth daily.    [provider]  Suvorexant (BELSOMRA) 10 MG TABS Take 15 mg by mouth at bedtime.     [provider]  Vitamin D, Ergocalciferol, (DRISDOL) 1.25 MG (50000 UNIT) CAPS capsule Take 50,000 Units by mouth every 14 (fourteen) days.    [provider]  ziprasidone (GEODON) 80 MG capsule Take 80 mg by mouth 2 (two) times daily with a meal.    [provider]    Allergies Dilaudid [hydromorphone hcl], Hydrocodone-acetaminophen, Hydromorphone, Morphine and related, Tetracycline, Tetracyclines & related, Penicillins, and Sulfa antibiotics  Family History  Problem Relation Age of Onset  . Diabetes Mother   . Heart attack Father   . Diabetes Brother   . Heart attack Brother     Social History Social History    Tobacco Use  . Smoking status: Current Some Day Smoker    Packs/day: 0.25    Types: Cigarettes    Last attempt to quit: 03/15/1963    Years since quitting: 56.6  . Smokeless tobacco:  Never Used  Substance Use Topics  . Alcohol use: No  . Drug use: No    Review of Systems Level 5 caveat: The patient's history and physical exam are limited by his chronic psychiatric issues, lack of cooperation, and combative behavior as well as being a vague historian.  ____________________________________________   PHYSICAL EXAM:  VITAL SIGNS: ED Triage Vitals [10/18/19 0400]  Enc Vitals Group     BP (!) 144/67     Pulse Rate 66     Resp 18     Temp 98.1 F (36.7 C)     Temp Source Oral     SpO2 97 %     Weight      Height      Head Circumference      Peak Flow      Pain Score      Pain Loc      Pain Edu?      Excl. in [place]?     Constitutional: Alert and oriented to self. Eyes: Conjunctivae are normal.  Head: Atraumatic. Nose: No congestion/rhinnorhea. Mouth/Throat: Patient is wearing a mask. Neck: No stridor.  No meningeal signs.   Cardiovascular: Normal rate, regular rhythm. Good peripheral circulation. Grossly normal heart sounds. Respiratory: Normal respiratory effort.  No retractions.  On oxygen at baseline. Gastrointestinal: Morbid obesity.  Soft and nontender. No distention.  Musculoskeletal: Patient has a deep and extensive macerated laceration in the webbing between his thumb and index finger on his left hand.  The patient does not allow extensive examination.  When redirected constantly, he is willing to move his fingers and his thumb.  He denies any numbness nor tingling.  There is no palpable or visible foreign body within the wound.  It is clearly deep but he will not allow for extensive investigation. Neurologic:  Normal speech and language.  Chronic right-sided hemiplegia with some contracture. Skin:  Skin is warm, dry and intact except for the left hand laceration  as described above. Psychiatric: Mood and affect are highly labile.  For a few minutes he will remain calm and somewhat cooperative and then he will began yelling for no apparent reason, screaming and cursing at staff or anyone who happens to walk by his bed, demanding pain medicine and then refusing it when it is brought to him, etc.  ____________________________________________   LABS (all labs ordered are listed, but only abnormal results are displayed)  Labs Reviewed - No data to display ____________________________________________  EKG  No indication for emergent EKG ____________________________________________  RADIOLOGY I, Hinda Kehr, personally viewed and evaluated these images (plain radiographs) as part of my medical decision making, as well as reviewing the written report by the radiologist.  ED MD interpretation: Soft tissue injury, no evidence of acute bony abnormality such as fracture or dislocation and no evidence of foreign bodies.  Official radiology report(s): DG Hand Complete Left  Result Date: 10/18/2019 CLINICAL DATA:  Fall with laceration near the thumb EXAM: LEFT HAND - COMPLETE 3+ VIEW COMPARISON:  None. FINDINGS: There is soft tissue irregularity and increased attenuation with overlying bandaging material at the first web space between the first and second digits, may correlate with site of laceration. Few foci of soft tissue gas are noted within the webspace. No associated bony defect. No visible fracture, subluxation, or dislocation. There diffuse mild narrowing of the interphalangeal joint spaces as well as spurring at the first carpometacarpal and triscaphe joints. No convincing features of underlying arthropathy. Mildly diminished bone mineralization. IMPRESSION:  1. Soft tissue irregularity and increased attenuation within the soft tissues of the first web space, likely correlating to the site of laceration. Few foci of soft tissue gas without foreign body or  acute osseous injury. 2. Mild degenerative changes as above. 3. Mild bony demineralization. Electronically Signed   By: Lovena Le M.D.   On: 10/18/2019 04:53    ____________________________________________   PROCEDURES   Procedure(s) performed (including Critical Care):  Procedures   ____________________________________________   INITIAL IMPRESSION / MDM / [place] / ED COURSE  As part of my medical decision making, I reviewed the following data within the [facility] notes reviewed and incorporated, Old chart reviewed and Notes from prior ED visits   Differential diagnosis includes, but is not limited to, soft tissue injury including ligamentous or tendon or muscle wound, neurovascular damage, fracture/dislocation.  The patient's mental status is apparently chronic based on multiple notes indicating his propensity for being belligerent to skilled nursing facility staff and ED staff on prior visits.  He is redirectable in most cases but he is absolutely adamant that he not receive any injections or have an IV placed.  I tried to provide him with morphine 8 mg intramuscular because he was initially reporting that his hand was hurting and he became nearly violent when the nurse approached him with the syringe.  He has a deep wound on his hand but it appears clean.  The skin is macerated and even in the best of circumstances, closing the wound would be a challenge.  His cooperation limits the circumstances substantially.  While he was calm I talked to him about the need to clean the wound and close it at a minimum, and he allowed for it to be extensively irrigated with normal saline but he refused anything that involved needles, including sutures.  Both I and my nurse tried contacting his daughter [name] who reportedly is his power of attorney but there was no answer and no messages were returned from a HIPAA compliant voicemail.  Given the patient's  adamant refusal for additional care, as well as his extensive chronic medical issues, I feel that the safest thing for him is for extensive irrigation and closing the wounds as best I can with Steri-Strips.  Additional evaluation of the wound and wound repair would require sedation and the patient is extremely high risk for procedural sedation or even intramuscular calming agents.  Given that it is well-documented that he is fully dependent for his ADLs, I believe that it is best for him if we approximate the skin as well as possible, wrap it with Curlex, and discharge for wound care.  I gave him a dose of Keflex 500 mg by mouth in writing a prescription for Keflex (sent electronically).  Provided follow up info for PCP and/or hand specialist.   ____________________________________________  FINAL CLINICAL IMPRESSION(S) / ED DIAGNOSES  Final diagnoses:  Laceration of left hand without foreign body, initial encounter     MEDICATIONS GIVEN DURING THIS VISIT:  Medications  morphine 4 MG/ML injection 8 mg (8 mg Intramuscular Refused 10/18/19 0438)  cephALEXin (KEFLEX) capsule 500 mg (500 mg Oral Given 10/18/19 0615)     ED Discharge Orders         Ordered    cephALEXin (KEFLEX) 500 MG capsule  4 times daily     10/18/19 0755          *Please note:  [name] was evaluated in Emergency Department  on 10/18/2019 for the symptoms described in the history of present illness. He was evaluated in the context of the global COVID-19 pandemic, which necessitated consideration that the patient might be at risk for infection with the SARS-CoV-2 virus that causes COVID-19. Institutional protocols and algorithms that pertain to the evaluation of patients at risk for COVID-19 are in a state of rapid change based on information released by regulatory bodies including the CDC and federal and state organizations. These policies and algorithms were followed during the patient's care in the ED.  Some ED  evaluations and interventions may be delayed as a result of limited staffing during the pandemic.*  Note:  This document was prepared using Dragon voice recognition software and may include unintentional dictation errors.   Hinda Kehr, MD 10/18/19 [phone]

## 2019-10-18 NOTE — ED Notes (Signed)
Attempted to call [facility] x 2 to give report and notify of patient's pending return, unable to give report, pt taken back to [facility] via [facility].

## 2019-10-18 NOTE — ED Notes (Signed)
Per note pt has a POA and [name] (daughter [place]) called and messages left with no answer.

## 2019-11-23 ENCOUNTER — Other Ambulatory Visit: Payer: Self-pay

## 2019-11-23 ENCOUNTER — Encounter: Payer: Self-pay | Admitting: Nurse Practitioner

## 2019-11-23 ENCOUNTER — Non-Acute Institutional Stay: Payer: Medicare Other | Admitting: Nurse Practitioner

## 2019-11-23 VITALS — BP 104/57 | HR 79 | Resp 18 | Wt 295.0 lb

## 2019-11-23 DIAGNOSIS — Z515 Encounter for palliative care: Secondary | ICD-10-CM

## 2019-11-23 DIAGNOSIS — I509 Heart failure, unspecified: Secondary | ICD-10-CM

## 2019-11-23 NOTE — Progress Notes (Signed)
[facility] Consult Note Telephone: [phone]  Fax: [phone]  PATIENT NAME: [name] DOB: 12/05/1945 MRN: [id number]  PRIMARY CARE PROVIDER:Dr Yves Dill PROVIDER:Dr Hodges/Verdel Hayes daughter [phone]  RECOMMENDATIONS and PLAN: 1. ACP: DNR  2. Palliative care encounter; Palliative medicine team will continue to support patient, patient's family, and medical team. Visit consisted of counseling and education dealing with the complex and emotionally intense issues of symptom management and palliative care in the setting of serious and potentially life-threatening illness  I spent 50 minutes providing this consultation, start 1:30pm. More than 50% of the time in this consultation was spent coordinating communication.   HISTORY OF PRESENT ILLNESS:  [name] is a 74 y.o. year old male with multiple medical problems including Chronic respiratory failure with hypoxia, chronic diastolic congestive heart failure, coronary artery disease s/p aortic coronary bypass graft, cerebral infarction, non elevated st-myocardial infarction, 02 dependent, peripheral vascular disease, COPD, morbid severe obesity, diabetes, hypertension, hypothyroidism, iron deficiency anemia, chronic kidney disease, BPH, gout, vitamin D deficiency, gerd, allergies, insomnia, schizoaffective disorder bipolar type. Mr Pianka continues to reside in [place] at [facility]. Mr. Metoyer does require assistance for mobility, transfers, ADL, toileting. Mr Speir is able to feed himself though requires assistance for tray setup. Mr. Cardoza did have a fall on 5 / 7 / 2021 with skin tear noted right knee. Last seen by [facility] in [place] 4 / 29 / 2021 added Haldol continue Latuda and bupropion with recommendation for if insomnia  continues to an increased Belsomra and to 20 mg qhs. Last primary provider visit from [facility] practitioner 6/10 specs 2021 for follow-up laceration to left hand completed antibiotics. Medical goals focus on DNR and patients wishes are not to be placed on a life-support system for extended. Of time more than a week. Which is are for surgical intervention, hospitalization, lab testing, diagnostic testing, antibiotics, IV therapy oh, blood transfusion. Staff endorses Mr Duesing continues to have behaviors at times yelling out at staff. No other changes our concerns. He is followed by Podiatry at the facility. At present Mr. Smock is lying in bed. He appears debilitated, comfortable, sleeping. Mr Guthrie did awake to verbal cues make eye contact and return to sleep. Mr. Pavel was cooperative with assessment. Mr. Korte did remain very sleepy during palliative care visit. Mr Cowher denied pain. Mr. Warehime was quiet today limited answering questions so continue to fall asleep throughout visit. Emotional support provided. Will contact [name], daughter for further discussion of medical goals of care, attempt to complete must form. I updated nursing a new changes at present time to Medical goals of care. Palliative Care was asked to help to continue to address goals of care.   CODE STATUS: DNR  PPS: 30% HOSPICE ELIGIBILITY/DIAGNOSIS: TBD  PAST MEDICAL HISTORY:  Past Medical History:  Diagnosis Date   Abscess 10/08/2016   Abscess of right leg excluding foot    Acute on chronic renal failure ([place]) 08/23/2016   Acute on chronic respiratory failure ([place]) 08/23/2016   AKI (acute kidney injury) ([place]) 08/27/2016   Arthritis    Asthma    Bipolar 1 disorder ([place]) 07/23/2016   Last Assessment & Plan:  On multiple meds including geodon and seems to be stable.     CAD (coronary artery disease) 04/27/2016   Cellulitis 09/21/2016   Chest pain, rule out acute myocardial infarction  02/16/2016   CHF  (congestive heart failure) (HCC)    Chronic bronchitis ([place]) 07/23/2016   Last Assessment & Plan:  Breathing at baseline now with some chronic sob.    Current use of proton pump inhibitor 08/23/2016   Demand ischemia ([place]) 08/27/2016   Diabetes ([place])    DVT (deep venous thrombosis) ([place]) 08/27/2016   Electrolyte imbalance 08/23/2016   Elevated troponin 08/27/2016   Heart trouble    History of asthma 08/23/2016   History of bipolar disorder 08/23/2016   History of chronic pain 08/23/2016   History of gastric ulcer 08/23/2016   History of stomach ulcers    History of stroke 07/23/2016   Last Assessment & Plan:  On asa/plavix so won't increase asa for now re dvt prevention   HTN (hypertension) 04/27/2016   Hypertension    Lactic acidosis 08/23/2016   MRSA carrier 08/27/2016   Obesity, unspecified 11/22/2013   OSA (obstructive sleep apnea) 08/23/2016   Pulmonary embolism ([place]) 08/23/2016   Reactive airway disease 11/22/2013   S/P CABG x 2    SIRS (systemic inflammatory response syndrome) ([place]) 08/23/2016   Skin cancer    Stroke [facility])     SOCIAL HX:  Social History   Tobacco Use   Smoking status: Current Some Day Smoker    Packs/day: 0.25    Types: Cigarettes    Last attempt to quit: 03/15/1963    Years since quitting: 56.7   Smokeless tobacco: Never Used  Substance Use Topics   Alcohol use: No    ALLERGIES:  Allergies  Allergen Reactions   Dilaudid [Hydromorphone Hcl] Other (See Comments)    "CANNOT HEAR, CANNOT THINK"     Hydrocodone-Acetaminophen Nausea And Vomiting   Hydromorphone Nausea Only    Vomiting, diarrhea.   Morphine And Related Other (See Comments)    Patient was told by a doctor that this "would kill" him   Tetracycline Other (See Comments)    Patient was told by a doctor that this "would kill" him   Tetracyclines & Related Other (See Comments)    Patient doesn't recall the reaction   Penicillins Rash    Has patient had a PCN reaction causing  immediate rash, facial/tongue/throat swelling, SOB or lightheadedness with hypotension: Yes Has patient had a PCN reaction causing severe rash involving mucus membranes or skin necrosis: No Has patient had a PCN reaction that required hospitalization No Has patient had a PCN reaction occurring within the last 10 years: No If all of the above answers are "NO", then may proceed with Cephalosporin use.    Sulfa Antibiotics Rash     PERTINENT MEDICATIONS:  Outpatient Encounter Medications as of 11/23/2019  Medication Sig   acetaminophen (TYLENOL) 325 MG tablet Take 650 mg by mouth every 4 (four) hours as needed (general discomfort).   albuterol (PROAIR HFA) 108 (90 Base) MCG/ACT inhaler Inhale 2 puffs into the lungs every 4 (four) hours as needed for wheezing or shortness of breath.   amLODipine (NORVASC) 10 MG tablet Take 5 mg by mouth daily.    aspirin 81 MG EC tablet Chew 81 mg by mouth daily.   buPROPion (WELLBUTRIN SR) 100 MG 12 hr tablet Take 100 mg by mouth daily.    cephALEXin (KEFLEX) 500 MG capsule Take 1 capsule (500 mg total) by mouth 4 (four) times daily.   colchicine 0.6 MG tablet Take 0.6 mg by mouth daily.   denosumab (PROLIA) 60 MG/ML SOSY injection Inject 60 mg into the skin every  6 (six) months.   doxepin (SINEQUAN) 25 MG capsule Take 25 mg by mouth at bedtime.    febuxostat (ULORIC) 40 MG tablet Take 40 mg by mouth daily.   FLUoxetine (PROZAC) 40 MG capsule Take 40 mg by mouth daily.   fluticasone (FLONASE) 50 MCG/ACT nasal spray Place 1 spray into both nostrils daily.   Fluticasone-Salmeterol (ADVAIR) 100-50 MCG/DOSE AEPB Inhale 1 puff into the lungs 2 (two) times daily.   furosemide (LASIX) 40 MG tablet Take 40 mg by mouth 2 (two) times daily.   ipratropium-albuterol (DUONEB) 0.5-2.5 (3) MG/3ML SOLN Inhale 3 mLs into the lungs every 4 (four) hours as needed (shortness of breath/wheezing).    iron polysaccharides (NIFEREX) 150 MG capsule Take 150 mg by mouth  daily.   levothyroxine (SYNTHROID, LEVOTHROID) 100 MCG tablet Take 100 mcg by mouth daily before breakfast.    lisinopril (ZESTRIL) 2.5 MG tablet Take 2.5 mg by mouth daily.   lurasidone (LATUDA) 80 MG TABS tablet Take 120 mg by mouth daily with breakfast.   magnesium oxide (MAG-OX) 400 MG tablet Take 400 mg by mouth daily.   meclizine (ANTIVERT) 25 MG tablet Take 25 mg by mouth 2 (two) times daily as needed for dizziness.    metolazone (ZAROXOLYN) 5 MG tablet Take 5 mg by mouth daily.   metoprolol (LOPRESSOR) 50 MG tablet Take 50 mg by mouth 2 (two) times daily.    montelukast (SINGULAIR) 10 MG tablet Take 1 tablet by mouth at bedtime.    Multiple Vitamin (MULTIVITAMIN WITH MINERALS) TABS tablet Take 1 tablet by mouth daily.   nitroGLYCERIN (NITROSTAT) 0.4 MG SL tablet Place 0.4 mg under the tongue every 5 (five) minutes as needed for chest pain.   pantoprazole (PROTONIX) 40 MG tablet Take 40 mg by mouth daily.    potassium chloride (KLOR-CON) 20 MEQ packet Take 20 mEq by mouth 2 (two) times daily.   potassium chloride SA (K-DUR,KLOR-CON) 20 MEQ tablet Take 40 mEq by mouth daily.    rivaroxaban (XARELTO) 20 MG TABS tablet Take 1 tablet (20 mg total) by mouth daily with supper.   rosuvastatin (CRESTOR) 10 MG tablet Take 10 mg by mouth daily.   senna-docusate (SENOKOT-S) 8.6-50 MG tablet Take 2 tablets by mouth daily.   Suvorexant (BELSOMRA) 10 MG TABS Take 15 mg by mouth at bedtime.    Vitamin D, Ergocalciferol, (DRISDOL) 1.25 MG (50000 UNIT) CAPS capsule Take 50,000 Units by mouth every 14 (fourteen) days.   ziprasidone (GEODON) 80 MG capsule Take 80 mg by mouth 2 (two) times daily with a meal.   No facility-administered encounter medications on file as of 11/23/2019.    PHYSICAL EXAM:   General: functional quadriplegic, confused male Cardiovascular: regular rate and rhythm Pulmonary: clear ant fields Neurological: functional quadriplegic  Mate Alegria Ihor Gully,  NP

## 2019-12-13 ENCOUNTER — Non-Acute Institutional Stay: Payer: Medicare Other | Admitting: Nurse Practitioner

## 2019-12-13 ENCOUNTER — Other Ambulatory Visit: Payer: Self-pay

## 2019-12-13 ENCOUNTER — Encounter: Payer: Self-pay | Admitting: Nurse Practitioner

## 2019-12-13 VITALS — Wt 295.0 lb

## 2019-12-13 DIAGNOSIS — Z515 Encounter for palliative care: Secondary | ICD-10-CM

## 2019-12-13 DIAGNOSIS — I509 Heart failure, unspecified: Secondary | ICD-10-CM

## 2019-12-13 NOTE — Progress Notes (Signed)
[facility] Consult Note Telephone: [phone]  Fax: [phone]  PATIENT NAME: [name] DOB: 16-Apr-1946 MRN: [id number]  PRIMARY CARE PROVIDER:Dr Yves Dill PROVIDER:Dr Hodges/[place] daughter [phone]  RECOMMENDATIONS and PLAN: 1. ACP: DNR  2. Palliative care encounter; Palliative medicine team will continue to support patient, patient's family, and medical team. Visit consisted of counseling and education dealing with the complex and emotionally intense issues of symptom management and palliative care in the setting of serious and potentially life-threatening illness  I spent 60 minutes providing this consultation, start at 10:20am. More than 50% of the time in this consultation was spent coordinating communication.   HISTORY OF PRESENT ILLNESS:  [name] is a 74 y.o. year old male with multiple medical problems including Chronic respiratory failure with hypoxia, chronic diastolic congestive heart failure, coronary artery disease s/p aortic coronary bypass graft, cerebral infarction, non elevated st-myocardial infarction, 02 dependent, peripheral vascular disease, COPD, morbid severe obesity, diabetes, hypertension, hypothyroidism, iron deficiency anemia, chronic kidney disease, BPH, gout, vitamin D deficiency, gerd, allergies, insomnia, schizoaffective disorder bipolar type.. [name] continue to reside at [place] at [facility] [name] is bed-bound does require assistance for turning, positioning, nobility, total ADL dependents with episodes of incontinence. [name] does wear adult diapers. [name] does feed himself a surgery set up an appetite has been fair. Last primary provider is about the nurse practitioner 11/02/2019 for a laceration follow-up to left-hand completed antibiotics. No  changes . Medical goals focus on wishes for  DNR, antibiotic therapy, blood transfusion, diagnostic testing, hospitalization, hydration IV fluids, love testing, surgical intervention. No recent wounds, hospitalizations, infections, falls. At present [name] is lying in bed. [name] has been yelling in his room, at staff. [name] is redirectable upon entering the room. I visited and observed [name]. [name] and I talked about symptoms of pain and shortness of breath which he denies. We talked about appetite and breakfast. [name] does not remember what he had to eat. [name] endorses he continues to be hungry. [name] wants "9 sausage and cheese biscuits with two tomatoes and 9 pieces of lettuce". [name] was cooperative with assessment. Limited verbal discussion with cognitive impairment. Emotional support provided. I have reviewed medical goals of care. I have attempted to contact [name] his daughter, message left for update on palliative care visit and further discussion of goals of care including behaviors. I updated nursing staff no new changes in poc or goals.  Palliative Care was asked to help to continue to address goals of care.   CODE STATUS: DNR  PPS: 30% HOSPICE ELIGIBILITY/DIAGNOSIS: TBD  PAST MEDICAL HISTORY:  Past Medical History:  Diagnosis Date  . Abscess 10/08/2016  . Abscess of right leg excluding foot   . Acute on chronic renal failure ([place]) 08/23/2016  . Acute on chronic respiratory failure ([place]) 08/23/2016  . AKI (acute kidney injury) ([place]) 08/27/2016  . Arthritis   . Asthma   . Bipolar 1 disorder ([place]) 07/23/2016   Last Assessment & Plan:  On multiple meds including geodon and seems to be stable.    [name] CAD (coronary artery disease) 04/27/2016  . Cellulitis 09/21/2016  . Chest pain, rule out acute myocardial infarction 02/16/2016  . CHF (congestive heart failure) ([place])   . Chronic bronchitis ([place]) 07/23/2016   Last Assessment & Plan:  Breathing  at  baseline now with some chronic sob.   . Current use of proton pump inhibitor 08/23/2016  . Demand ischemia ([place]) 08/27/2016  . Diabetes ([place])   . DVT (deep venous thrombosis) ([place]) 08/27/2016  . Electrolyte imbalance 08/23/2016  . Elevated troponin 08/27/2016  . Heart trouble   . History of asthma 08/23/2016  . History of bipolar disorder 08/23/2016  . History of chronic pain 08/23/2016  . History of gastric ulcer 08/23/2016  . History of stomach ulcers   . History of stroke 07/23/2016   Last Assessment & Plan:  On asa/plavix so won't increase asa for now re dvt prevention  . HTN (hypertension) 04/27/2016  . Hypertension   . Lactic acidosis 08/23/2016  . 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 Obesity, unspecified 11/22/2013  . OSA (obstructive sleep apnea) 08/23/2016  . Pulmonary embolism ([place]) 08/23/2016  . Reactive airway disease 11/22/2013  . S/P CABG x 2   . SIRS (systemic inflammatory response syndrome) ([place]) 08/23/2016  . Skin cancer   . Stroke [facility])     SOCIAL HX:  Social History   Tobacco Use  . Smoking status: Current Some Day Smoker    Packs/day: 0.25    Types: Cigarettes    Last attempt to quit: 03/15/1963    Years since quitting: 56.7  . Smokeless tobacco: Never Used  Substance Use Topics  . Alcohol use: No    ALLERGIES:  Allergies  Allergen Reactions  . Dilaudid [Hydromorphone Hcl] Other (See Comments)    "CANNOT HEAR, CANNOT THINK"    . Hydrocodone-Acetaminophen Nausea And Vomiting  . Hydromorphone Nausea Only    Vomiting, diarrhea.  . Morphine And Related Other (See Comments)    Patient was told by a doctor that this "would kill" him  . Tetracycline Other (See Comments)    Patient was told by a doctor that this "would kill" him  . Tetracyclines & Related Other (See Comments)    Patient doesn't recall the reaction  . Penicillins Rash    Has patient had a PCN reaction causing immediate rash, facial/tongue/throat swelling, SOB or lightheadedness with hypotension: Yes Has patient had a PCN  reaction causing severe rash involving mucus membranes or skin necrosis: No Has patient had a PCN reaction that required hospitalization No Has patient had a PCN reaction occurring within the last 10 years: No If all of the above answers are "NO", then may proceed with Cephalosporin use.   . Sulfa Antibiotics Rash     PERTINENT MEDICATIONS:  Outpatient Encounter Medications as of 12/13/2019  Medication Sig  . acetaminophen (TYLENOL) 325 MG tablet Take 650 mg by mouth every 4 (four) hours as needed (general discomfort).  [name] albuterol (PROAIR HFA) 108 (90 Base) MCG/ACT inhaler Inhale 2 puffs into the lungs every 4 (four) hours as needed for wheezing or shortness of breath.  [name] amLODipine (NORVASC) 10 MG tablet Take 5 mg by mouth daily.   [name] aspirin 81 MG EC tablet Chew 81 mg by mouth daily.  [name] buPROPion (WELLBUTRIN SR) 100 MG 12 hr tablet Take 100 mg by mouth daily.   . cephALEXin (KEFLEX) 500 MG capsule Take 1 capsule (500 mg total) by mouth 4 (four) times daily.  . colchicine 0.6 MG tablet Take 0.6 mg by mouth daily.  [name] denosumab (PROLIA) 60 MG/ML SOSY injection Inject 60 mg into the skin every 6 (six) months.  . doxepin (SINEQUAN) 25 MG capsule Take 25 mg by mouth at bedtime.   . febuxostat (ULORIC) 40 MG tablet  Take 40 mg by mouth daily.  [name] FLUoxetine (PROZAC) 40 MG capsule Take 40 mg by mouth daily.  . fluticasone (FLONASE) 50 MCG/ACT nasal spray Place 1 spray into both nostrils daily.  . Fluticasone-Salmeterol (ADVAIR) 100-50 MCG/DOSE AEPB Inhale 1 puff into the lungs 2 (two) times daily.  . furosemide (LASIX) 40 MG tablet Take 40 mg by mouth 2 (two) times daily.  [name] ipratropium-albuterol (DUONEB) 0.5-2.5 (3) MG/3ML SOLN Inhale 3 mLs into the lungs every 4 (four) hours as needed (shortness of breath/wheezing).   . iron polysaccharides (NIFEREX) 150 MG capsule Take 150 mg by mouth daily.  [name] levothyroxine (SYNTHROID, LEVOTHROID) 100 MCG tablet Take 100 mcg by mouth daily before breakfast.   .  lisinopril (ZESTRIL) 2.5 MG tablet Take 2.5 mg by mouth daily.  [name] lurasidone (LATUDA) 80 MG TABS tablet Take 120 mg by mouth daily with breakfast.  . magnesium oxide (MAG-OX) 400 MG tablet Take 400 mg by mouth daily.  . meclizine (ANTIVERT) 25 MG tablet Take 25 mg by mouth 2 (two) times daily as needed for dizziness.   . metolazone (ZAROXOLYN) 5 MG tablet Take 5 mg by mouth daily.  . metoprolol (LOPRESSOR) 50 MG tablet Take 50 mg by mouth 2 (two) times daily.   . montelukast (SINGULAIR) 10 MG tablet Take 1 tablet by mouth at bedtime.   . Multiple Vitamin (MULTIVITAMIN WITH MINERALS) TABS tablet Take 1 tablet by mouth daily.  . nitroGLYCERIN (NITROSTAT) 0.4 MG SL tablet Place 0.4 mg under the tongue every 5 (five) minutes as needed for chest pain.  . pantoprazole (PROTONIX) 40 MG tablet Take 40 mg by mouth daily.   . potassium chloride (KLOR-CON) 20 MEQ packet Take 20 mEq by mouth 2 (two) times daily.  . potassium chloride SA (K-DUR,KLOR-CON) 20 MEQ tablet Take 40 mEq by mouth daily.   . rivaroxaban (XARELTO) 20 MG TABS tablet Take 1 tablet (20 mg total) by mouth daily with supper.  . rosuvastatin (CRESTOR) 10 MG tablet Take 10 mg by mouth daily.  [name] senna-docusate (SENOKOT-S) 8.6-50 MG tablet Take 2 tablets by mouth daily.  . Suvorexant (BELSOMRA) 10 MG TABS Take 15 mg by mouth at bedtime.   . Vitamin D, Ergocalciferol, (DRISDOL) 1.25 MG (50000 UNIT) CAPS capsule Take 50,000 Units by mouth every 14 (fourteen) days.  . ziprasidone (GEODON) 80 MG capsule Take 80 mg by mouth 2 (two) times daily with a meal.   No facility-administered encounter medications on file as of 12/13/2019.    PHYSICAL EXAM:   General: obese, debilitated, chronically ill, confused male Cardiovascular: regular rate and rhythm Pulmonary: clear ant fields Neurological: functional quadriplegic  Myrick Mcnairy Ihor Gully, NP

## 2019-12-20 ENCOUNTER — Non-Acute Institutional Stay: Payer: Medicare Other | Admitting: Nurse Practitioner

## 2019-12-20 ENCOUNTER — Encounter: Payer: Self-pay | Admitting: Nurse Practitioner

## 2019-12-20 ENCOUNTER — Other Ambulatory Visit: Payer: Self-pay

## 2019-12-20 DIAGNOSIS — Z515 Encounter for palliative care: Secondary | ICD-10-CM

## 2019-12-20 DIAGNOSIS — I509 Heart failure, unspecified: Secondary | ICD-10-CM

## 2019-12-20 NOTE — Progress Notes (Signed)
[name] Consult Note Telephone: [phone]  Fax: [phone]  PATIENT NAME: [name] DOB: 09-23-1945 MRN: [id number]  PRIMARY CARE PROVIDER:   Jodi Marble, MD  REFERRING PROVIDER:  Jodi Marble, MD [place],  [place] [address]  PRIMARY CARE PROVIDER:Dr [name] PROVIDER:Dr [name]/[name] RESPONSIBLE PARTY:HCPOA [name] daughter [phone]  RECOMMENDATIONS and PLAN: 1. ACP:DNR  2. Palliative care encounter; Palliative medicine team will continue to support patient, patient's family, and medical team. Visit consisted of counseling and education dealing with the complex and emotionally intense issues of symptom management and palliative care in the setting of serious and potentially life-threatening illness  3. Follow-up-4 weeks for debility, monitoring decline, symptoms behaviors (supportive as medications managed by psychiatry), appetite  I spent 60 minutes providing this consultation,  Start at 9:30am. More than 50% of the time in this consultation was spent coordinating communication.   HISTORY OF PRESENT ILLNESS:  [name] is a 74 y.o. year old male with multiple medical problems including Chronic respiratory failure with hypoxia, chronic diastolic congestive heart failure, coronary artery disease s/p aortic coronary bypass graft, cerebral infarction, non elevated st-myocardial infarction, 02 dependent, peripheral vascular disease, COPD, morbid severe obesity, diabetes, hypertension, hypothyroidism, iron deficiency anemia, chronic kidney disease, BPH, gout, vitamin D deficiency, gerd, allergies, insomnia, schizoaffective disorder bipolar type.. [name]. [name] continues to reside at [address] [facility]. [name] [name] does remain total ADL depend in requiring assistance for transfers, mobility, dressing,  bathing, toileting as he [place] incontinent in adult diapers. [name] [name] does feed himself after tray setup. Staff endorses ongoing difficulties with behavior says he has an upcoming in-person appointment with [facility] who has been following him for many years. Staff endorses attempt to refocus or redirect often with failed results. No recent falks, wounds, infections, hospitalizations. At present [name]. [name] is lying in bed. [name] [name] appears chronically ill overall severely debilitated. No visitors present. I visited and observed [name]. [name]. [name]. [name] appears to be hallucinating yelling at people that are not there. [name] Poet was redirectable, making eye contact and Cooperative with assessment. No meaningful discussion with cognitive impairment. Emotional support provided. [name], [name]. Rhames daughter [name] of attorney return call. We talked about palliative care visit with [name]. Rodino. We talked about recent visit [name] had with [name]. [name] endorses it seemed like he did not know they were there, appeared manic with behaviors. We talked about upcoming appointment at [facility] being in person. We talked about concerns for worsening cognitive and functional disability. We talked about option of talk therapy at the facility. [name] in agreement to have caused therapy started as she feels it could be supportive for [name]. [name]. We talked about medical goals of care. We talked about chronic disease progression. We talked about behaviors.  Discussed will see what happens with appointment at [facility] and medication adjustment. [name] endorses she or her sister will go with him to the appointment which would be very beneficial. We talked about this till the opening up visiting hours more liberal and encourage [name] to visit. We talked about overall disability and functional clinical decline. We talked about quality of life. We talked  about role of palliative care and plan of care. Discuss will follow up in 4 weeks after visit to [facility] or sooner should he decline. Therapeutic listening and emotional support  provided. Contact information. Questions answered to satisfaction. Communicated with social worker wishes for initiation of talk therapy had [facility]. I updated staff no other changes to goals with plan of care. Palliative Care was asked to help to continue to address goals of care.   CODE STATUS: DNR  PPS: 30% HOSPICE ELIGIBILITY/DIAGNOSIS: TBD  PAST MEDICAL HISTORY:  Past Medical History:  Diagnosis Date  . Abscess 10/08/2016  . Abscess of right leg excluding foot   . Acute on chronic renal failure ([place]) 08/23/2016  . Acute on chronic respiratory failure ([place]) 08/23/2016  . AKI (acute kidney injury) ([place]) 08/27/2016  . Arthritis   . Asthma   . Bipolar 1 disorder ([place]) 07/23/2016   Last Assessment & Plan:  On multiple meds including geodon and seems to be stable.    [name] CAD (coronary artery disease) 04/27/2016  . Cellulitis 09/21/2016  . Chest pain, rule out acute myocardial infarction 02/16/2016  . CHF (congestive heart failure) ([place])   . Chronic bronchitis ([place]) 07/23/2016   Last Assessment & Plan:  Breathing at baseline now with some chronic sob.   . Current use of proton pump inhibitor 08/23/2016  . Demand ischemia ([place]) 08/27/2016  . Diabetes ([place])   . DVT (deep venous thrombosis) ([place]) 08/27/2016  . Electrolyte imbalance 08/23/2016  . Elevated troponin 08/27/2016  . Heart trouble   . History of asthma 08/23/2016  . History of bipolar disorder 08/23/2016  . History of chronic pain 08/23/2016  . History of gastric ulcer 08/23/2016  . History of stomach ulcers   . History of stroke 07/23/2016   Last Assessment & Plan:  On asa/plavix so won't increase asa for now re dvt prevention  . HTN (hypertension) 04/27/2016  . Hypertension   . Lactic acidosis 08/23/2016  . 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 Obesity,  unspecified 11/22/2013  . OSA (obstructive sleep apnea) 08/23/2016  . Pulmonary embolism ([place]) 08/23/2016  . Reactive airway disease 11/22/2013  . S/P CABG x 2   . SIRS (systemic inflammatory response syndrome) ([place]) 08/23/2016  . Skin cancer   . Stroke [facility])     SOCIAL HX:  Social History   Tobacco Use  . Smoking status: Current Some Day Smoker    Packs/day: 0.25    Types: Cigarettes    Last attempt to quit: 03/15/1963    Years since quitting: 56.8  . Smokeless tobacco: Never Used  Substance Use Topics  . Alcohol use: No    ALLERGIES:  Allergies  Allergen Reactions  . Dilaudid [Hydromorphone Hcl] Other (See Comments)    "CANNOT HEAR, CANNOT THINK"    . Hydrocodone-Acetaminophen Nausea And Vomiting  . Hydromorphone Nausea Only    Vomiting, diarrhea.  . Morphine And Related Other (See Comments)    Patient was told by a doctor that this "would kill" him  . Tetracycline Other (See Comments)    Patient was told by a doctor that this "would kill" him  . Tetracyclines & Related Other (See Comments)    Patient doesn't recall the reaction  . Penicillins Rash    Has patient had a PCN reaction causing immediate rash, facial/tongue/throat swelling, SOB or lightheadedness with hypotension: Yes Has patient had a PCN reaction causing severe rash involving mucus membranes or skin necrosis: No Has patient had a PCN reaction that required hospitalization No Has patient had a PCN reaction occurring within the last 10 years: No If all of the above answers are "NO", then may proceed with Cephalosporin  use.   . Sulfa Antibiotics Rash     PERTINENT MEDICATIONS:  Outpatient Encounter Medications as of 12/08/2019  Medication Sig  . acetaminophen (TYLENOL) 325 MG tablet Take 650 mg by mouth every 4 (four) hours as needed (general discomfort).  [name] albuterol (PROAIR HFA) 108 (90 Base) MCG/ACT inhaler Inhale 2 puffs into the lungs every 4 (four) hours as needed for wheezing or shortness of breath.  [name]  amLODipine (NORVASC) 10 MG tablet Take 5 mg by mouth daily.   [name] aspirin 81 MG EC tablet Chew 81 mg by mouth daily.  [name] buPROPion (WELLBUTRIN SR) 100 MG 12 hr tablet Take 100 mg by mouth daily.   . cephALEXin (KEFLEX) 500 MG capsule Take 1 capsule (500 mg total) by mouth 4 (four) times daily.  . colchicine 0.6 MG tablet Take 0.6 mg by mouth daily.  [name] denosumab (PROLIA) 60 MG/ML SOSY injection Inject 60 mg into the skin every 6 (six) months.  . doxepin (SINEQUAN) 25 MG capsule Take 25 mg by mouth at bedtime.   . febuxostat (ULORIC) 40 MG tablet Take 40 mg by mouth daily.  [name] FLUoxetine (PROZAC) 40 MG capsule Take 40 mg by mouth daily.  . fluticasone (FLONASE) 50 MCG/ACT nasal spray Place 1 spray into both nostrils daily.  . Fluticasone-Salmeterol (ADVAIR) 100-50 MCG/DOSE AEPB Inhale 1 puff into the lungs 2 (two) times daily.  . furosemide (LASIX) 40 MG tablet Take 40 mg by mouth 2 (two) times daily.  [name] ipratropium-albuterol (DUONEB) 0.5-2.5 (3) MG/3ML SOLN Inhale 3 mLs into the lungs every 4 (four) hours as needed (shortness of breath/wheezing).   . iron polysaccharides (NIFEREX) 150 MG capsule Take 150 mg by mouth daily.  [name] levothyroxine (SYNTHROID, LEVOTHROID) 100 MCG tablet Take 100 mcg by mouth daily before breakfast.   . lisinopril (ZESTRIL) 2.5 MG tablet Take 2.5 mg by mouth daily.  [name] lurasidone (LATUDA) 80 MG TABS tablet Take 120 mg by mouth daily with breakfast.  . magnesium oxide (MAG-OX) 400 MG tablet Take 400 mg by mouth daily.  . meclizine (ANTIVERT) 25 MG tablet Take 25 mg by mouth 2 (two) times daily as needed for dizziness.   . metolazone (ZAROXOLYN) 5 MG tablet Take 5 mg by mouth daily.  . metoprolol (LOPRESSOR) 50 MG tablet Take 50 mg by mouth 2 (two) times daily.   . montelukast (SINGULAIR) 10 MG tablet Take 1 tablet by mouth at bedtime.   . Multiple Vitamin (MULTIVITAMIN WITH MINERALS) TABS tablet Take 1 tablet by mouth daily.  . nitroGLYCERIN (NITROSTAT) 0.4 MG SL tablet Place  0.4 mg under the tongue every 5 (five) minutes as needed for chest pain.  . pantoprazole (PROTONIX) 40 MG tablet Take 40 mg by mouth daily.   . potassium chloride (KLOR-CON) 20 MEQ packet Take 20 mEq by mouth 2 (two) times daily.  . potassium chloride SA (K-DUR,KLOR-CON) 20 MEQ tablet Take 40 mEq by mouth daily.   . rivaroxaban (XARELTO) 20 MG TABS tablet Take 1 tablet (20 mg total) by mouth daily with supper.  . rosuvastatin (CRESTOR) 10 MG tablet Take 10 mg by mouth daily.  [name] senna-docusate (SENOKOT-S) 8.6-50 MG tablet Take 2 tablets by mouth daily.  . Suvorexant (BELSOMRA) 10 MG TABS Take 15 mg by mouth at bedtime.   . Vitamin D, Ergocalciferol, (DRISDOL) 1.25 MG (50000 UNIT) CAPS capsule Take 50,000 Units by mouth every 14 (fourteen) days.  . ziprasidone (GEODON) 80 MG capsule Take 80 mg by mouth 2 (two) times  daily with a meal.   No facility-administered encounter medications on file as of 12/16/2019.    PHYSICAL EXAM:   General: NAD, obese, debilitated, confused male Cardiovascular: regular rate and rhythm Pulmonary: clear ant fields Neurological: Functional quadriplegic  [place], NP

## 2019-12-24 DEATH — deceased
